# Patient Record
Sex: Female | Born: 1965 | Race: White | Hispanic: No | Marital: Single | Smoking: Current every day smoker
Health system: Southern US, Community
[De-identification: ages and names within clinical notes are randomized; demographics above are authoritative.]

## PROBLEM LIST (undated history)

## (undated) DIAGNOSIS — E049 Nontoxic goiter, unspecified: Secondary | ICD-10-CM

## (undated) DIAGNOSIS — M519 Unspecified thoracic, thoracolumbar and lumbosacral intervertebral disc disorder: Secondary | ICD-10-CM

## (undated) DIAGNOSIS — E039 Hypothyroidism, unspecified: Secondary | ICD-10-CM

## (undated) DIAGNOSIS — S0990XA Unspecified injury of head, initial encounter: Secondary | ICD-10-CM

## (undated) DIAGNOSIS — J42 Unspecified chronic bronchitis: Secondary | ICD-10-CM

## (undated) DIAGNOSIS — R569 Unspecified convulsions: Secondary | ICD-10-CM

## (undated) DIAGNOSIS — M51369 Other intervertebral disc degeneration, lumbar region without mention of lumbar back pain or lower extremity pain: Secondary | ICD-10-CM

## (undated) DIAGNOSIS — M545 Low back pain, unspecified: Secondary | ICD-10-CM

## (undated) DIAGNOSIS — F319 Bipolar disorder, unspecified: Secondary | ICD-10-CM

## (undated) DIAGNOSIS — E785 Hyperlipidemia, unspecified: Secondary | ICD-10-CM

## (undated) DIAGNOSIS — M5126 Other intervertebral disc displacement, lumbar region: Secondary | ICD-10-CM

## (undated) DIAGNOSIS — R011 Cardiac murmur, unspecified: Secondary | ICD-10-CM

## (undated) DIAGNOSIS — I10 Essential (primary) hypertension: Secondary | ICD-10-CM

## (undated) DIAGNOSIS — R413 Other amnesia: Secondary | ICD-10-CM

## (undated) DIAGNOSIS — G8929 Other chronic pain: Secondary | ICD-10-CM

## (undated) DIAGNOSIS — F32A Depression, unspecified: Secondary | ICD-10-CM

## (undated) DIAGNOSIS — F419 Anxiety disorder, unspecified: Secondary | ICD-10-CM

## (undated) DIAGNOSIS — S0450XA Injury of facial nerve, unspecified side, initial encounter: Secondary | ICD-10-CM

## (undated) DIAGNOSIS — K219 Gastro-esophageal reflux disease without esophagitis: Secondary | ICD-10-CM

## (undated) DIAGNOSIS — M5136 Other intervertebral disc degeneration, lumbar region: Secondary | ICD-10-CM

## (undated) DIAGNOSIS — R51 Headache: Secondary | ICD-10-CM

## (undated) DIAGNOSIS — F329 Major depressive disorder, single episode, unspecified: Secondary | ICD-10-CM

## (undated) DIAGNOSIS — G43909 Migraine, unspecified, not intractable, without status migrainosus: Secondary | ICD-10-CM

## (undated) HISTORY — PX: ABDOMINAL HYSTERECTOMY: SHX81

## (undated) HISTORY — DX: Headache: R51

## (undated) HISTORY — PX: TONSILLECTOMY: SUR1361

---

## 2004-01-12 ENCOUNTER — Ambulatory Visit: Payer: Self-pay | Admitting: Anesthesiology

## 2004-07-27 ENCOUNTER — Emergency Department: Payer: Self-pay | Admitting: Unknown Physician Specialty

## 2004-09-01 ENCOUNTER — Ambulatory Visit: Payer: Self-pay | Admitting: Unknown Physician Specialty

## 2004-12-07 ENCOUNTER — Emergency Department: Payer: Self-pay | Admitting: Emergency Medicine

## 2005-06-03 ENCOUNTER — Emergency Department: Payer: Self-pay | Admitting: Emergency Medicine

## 2005-11-05 ENCOUNTER — Emergency Department: Payer: Self-pay | Admitting: Internal Medicine

## 2006-01-26 ENCOUNTER — Emergency Department: Payer: Self-pay | Admitting: Emergency Medicine

## 2006-04-24 ENCOUNTER — Emergency Department: Payer: Self-pay | Admitting: Emergency Medicine

## 2006-12-21 ENCOUNTER — Other Ambulatory Visit: Payer: Self-pay

## 2006-12-21 ENCOUNTER — Emergency Department: Payer: Self-pay | Admitting: Emergency Medicine

## 2007-10-18 ENCOUNTER — Emergency Department: Payer: Self-pay | Admitting: Internal Medicine

## 2007-10-24 ENCOUNTER — Encounter
Admission: RE | Admit: 2007-10-24 | Discharge: 2007-10-24 | Payer: Self-pay | Admitting: Physical Medicine & Rehabilitation

## 2008-05-08 ENCOUNTER — Emergency Department: Payer: Self-pay | Admitting: Emergency Medicine

## 2008-09-13 ENCOUNTER — Emergency Department: Payer: Self-pay | Admitting: Emergency Medicine

## 2009-07-05 ENCOUNTER — Inpatient Hospital Stay: Payer: Self-pay | Admitting: Unknown Physician Specialty

## 2009-08-04 ENCOUNTER — Inpatient Hospital Stay: Payer: Self-pay | Admitting: Psychiatry

## 2010-05-06 ENCOUNTER — Inpatient Hospital Stay: Payer: Self-pay | Admitting: Psychiatry

## 2010-06-17 ENCOUNTER — Emergency Department: Payer: Self-pay | Admitting: Emergency Medicine

## 2010-08-31 ENCOUNTER — Inpatient Hospital Stay: Payer: Self-pay | Admitting: Psychiatry

## 2010-09-12 ENCOUNTER — Emergency Department: Payer: Self-pay | Admitting: Emergency Medicine

## 2011-02-15 ENCOUNTER — Emergency Department: Payer: Self-pay | Admitting: Emergency Medicine

## 2011-02-15 LAB — DRUG SCREEN, URINE
Barbiturates, Ur Screen: NEGATIVE (ref ?–200)
Cannabinoid 50 Ng, Ur ~~LOC~~: NEGATIVE (ref ?–50)
Cocaine Metabolite,Ur ~~LOC~~: NEGATIVE (ref ?–300)
Opiate, Ur Screen: NEGATIVE (ref ?–300)
Phencyclidine (PCP) Ur S: NEGATIVE (ref ?–25)
Tricyclic, Ur Screen: NEGATIVE (ref ?–1000)

## 2011-02-15 LAB — URINALYSIS, COMPLETE
Bilirubin,UR: NEGATIVE
Nitrite: NEGATIVE
Ph: 5 (ref 4.5–8.0)
Protein: NEGATIVE
Squamous Epithelial: 9
WBC UR: 10 /HPF (ref 0–5)

## 2011-02-15 LAB — CBC
HCT: 37.5 % (ref 35.0–47.0)
HGB: 12.9 g/dL (ref 12.0–16.0)
MCV: 101 fL — ABNORMAL HIGH (ref 80–100)
Platelet: 211 10*3/uL (ref 150–440)
RBC: 3.71 10*6/uL — ABNORMAL LOW (ref 3.80–5.20)
RDW: 13.5 % (ref 11.5–14.5)
WBC: 5.2 10*3/uL (ref 3.6–11.0)

## 2011-02-15 LAB — ETHANOL
Ethanol %: <0.003 % (ref 0.000–0.080)
Ethanol: <3 mg/dL

## 2011-02-15 LAB — COMPREHENSIVE METABOLIC PANEL
Alkaline Phosphatase: 41 U/L — ABNORMAL LOW (ref 50–136)
Anion Gap: 9 (ref 7–16)
BUN: 8 mg/dL (ref 7–18)
Bilirubin,Total: 0.4 mg/dL (ref 0.2–1.0)
Calcium, Total: 8.7 mg/dL (ref 8.5–10.1)
Chloride: 101 mmol/L (ref 98–107)
EGFR (African American): >60
Glucose: 81 mg/dL (ref 65–99)
Osmolality: 269 (ref 275–301)
Potassium: 3.5 mmol/L (ref 3.5–5.1)
SGPT (ALT): 19 U/L
Sodium: 136 mmol/L (ref 136–145)
Total Protein: 8.3 g/dL — ABNORMAL HIGH (ref 6.4–8.2)

## 2011-04-25 LAB — CBC
HCT: 36.7 % (ref 35.0–47.0)
MCH: 34.4 pg — ABNORMAL HIGH (ref 26.0–34.0)
MCHC: 34.2 g/dL (ref 32.0–36.0)
MCV: 101 fL — ABNORMAL HIGH (ref 80–100)
Platelet: 235 10*3/uL (ref 150–440)
RBC: 3.65 10*6/uL — ABNORMAL LOW (ref 3.80–5.20)
RDW: 12.9 % (ref 11.5–14.5)
WBC: 6.1 10*3/uL (ref 3.6–11.0)

## 2011-04-25 LAB — URINALYSIS, COMPLETE
Bacteria: NONE SEEN
Blood: NEGATIVE
Glucose,UR: NEGATIVE mg/dL (ref 0–75)
Ketone: NEGATIVE
Nitrite: NEGATIVE
Specific Gravity: 1.024 (ref 1.003–1.030)
Squamous Epithelial: <1

## 2011-04-25 LAB — TSH: Thyroid Stimulating Horm: 5.45 u[IU]/mL — ABNORMAL HIGH

## 2011-04-25 LAB — SALICYLATE LEVEL: Salicylates, Serum: <1.7 mg/dL

## 2011-04-25 LAB — COMPREHENSIVE METABOLIC PANEL
Alkaline Phosphatase: 59 U/L (ref 50–136)
Anion Gap: 7 (ref 7–16)
BUN: 10 mg/dL (ref 7–18)
Bilirubin,Total: 0.3 mg/dL (ref 0.2–1.0)
Calcium, Total: 9.1 mg/dL (ref 8.5–10.1)
Co2: 27 mmol/L (ref 21–32)
Creatinine: 0.64 mg/dL (ref 0.60–1.30)
EGFR (Non-African Amer.): >60
Osmolality: 268 (ref 275–301)
Potassium: 4.3 mmol/L (ref 3.5–5.1)
SGPT (ALT): 33 U/L
Sodium: 135 mmol/L — ABNORMAL LOW (ref 136–145)
Total Protein: 7.9 g/dL (ref 6.4–8.2)

## 2011-04-25 LAB — DRUG SCREEN, URINE
Amphetamines, Ur Screen: NEGATIVE (ref ?–1000)
Cannabinoid 50 Ng, Ur ~~LOC~~: NEGATIVE (ref ?–50)
MDMA (Ecstasy)Ur Screen: NEGATIVE (ref ?–500)
Opiate, Ur Screen: POSITIVE (ref ?–300)
Phencyclidine (PCP) Ur S: NEGATIVE (ref ?–25)

## 2011-04-25 LAB — ETHANOL
Ethanol %: <0.003 % (ref 0.000–0.080)
Ethanol: <3 mg/dL

## 2011-04-25 LAB — PREGNANCY, URINE: Pregnancy Test, Urine: NEGATIVE m[IU]/mL

## 2011-04-25 LAB — ACETAMINOPHEN LEVEL: Acetaminophen: 112 ug/mL — ABNORMAL HIGH

## 2011-04-26 ENCOUNTER — Inpatient Hospital Stay: Payer: Self-pay | Admitting: Specialist

## 2011-04-26 LAB — ACETAMINOPHEN LEVEL: Acetaminophen: 25 ug/mL

## 2011-04-27 LAB — HEPATIC FUNCTION PANEL A (ARMC)
Albumin: 3.5 g/dL (ref 3.4–5.0)
Alkaline Phosphatase: 53 U/L (ref 50–136)
Bilirubin, Direct: <0.1 mg/dL (ref 0.00–0.20)
SGOT(AST): 18 U/L (ref 15–37)
SGPT (ALT): 31 U/L
Total Protein: 7.5 g/dL (ref 6.4–8.2)

## 2011-04-27 LAB — PROTIME-INR: INR: 1

## 2011-04-29 ENCOUNTER — Inpatient Hospital Stay: Payer: Self-pay | Admitting: Psychiatry

## 2011-04-29 LAB — DRUG SCREEN, URINE
Amphetamines, Ur Screen: NEGATIVE (ref ?–1000)
Barbiturates, Ur Screen: NEGATIVE (ref ?–200)
Benzodiazepine, Ur Scrn: POSITIVE (ref ?–200)
MDMA (Ecstasy)Ur Screen: NEGATIVE (ref ?–500)
Phencyclidine (PCP) Ur S: NEGATIVE (ref ?–25)

## 2011-08-21 ENCOUNTER — Inpatient Hospital Stay: Payer: Self-pay | Admitting: Psychiatry

## 2011-08-21 LAB — COMPREHENSIVE METABOLIC PANEL
Alkaline Phosphatase: 69 U/L (ref 50–136)
Anion Gap: 12 (ref 7–16)
BUN: 7 mg/dL (ref 7–18)
Bilirubin,Total: 0.4 mg/dL (ref 0.2–1.0)
Calcium, Total: 8.5 mg/dL (ref 8.5–10.1)
Co2: 22 mmol/L (ref 21–32)
EGFR (Non-African Amer.): >60
Glucose: 99 mg/dL (ref 65–99)
Osmolality: 276 (ref 275–301)
Sodium: 139 mmol/L (ref 136–145)
Total Protein: 8.1 g/dL (ref 6.4–8.2)

## 2011-08-21 LAB — DRUG SCREEN, URINE
Amphetamines, Ur Screen: NEGATIVE (ref ?–1000)
Benzodiazepine, Ur Scrn: NEGATIVE (ref ?–200)
Cannabinoid 50 Ng, Ur ~~LOC~~: NEGATIVE (ref ?–50)
MDMA (Ecstasy)Ur Screen: NEGATIVE (ref ?–500)
Methadone, Ur Screen: NEGATIVE (ref ?–300)

## 2011-08-21 LAB — TROPONIN I: Troponin-I: <0.02 ng/mL

## 2011-08-21 LAB — TSH: Thyroid Stimulating Horm: 6.57 u[IU]/mL — ABNORMAL HIGH

## 2011-08-21 LAB — CBC
HCT: 38.7 % (ref 35.0–47.0)
HGB: 13.4 g/dL (ref 12.0–16.0)
MCHC: 34.7 g/dL (ref 32.0–36.0)
RBC: 4.1 10*6/uL (ref 3.80–5.20)
WBC: 7.7 10*3/uL (ref 3.6–11.0)

## 2011-08-21 LAB — SALICYLATE LEVEL: Salicylates, Serum: <1.7 mg/dL

## 2011-08-23 LAB — BASIC METABOLIC PANEL
Anion Gap: 11 (ref 7–16)
BUN: 11 mg/dL (ref 7–18)
Chloride: 106 mmol/L (ref 98–107)
Co2: 25 mmol/L (ref 21–32)
Glucose: 100 mg/dL — ABNORMAL HIGH (ref 65–99)
Potassium: 4.3 mmol/L (ref 3.5–5.1)
Sodium: 142 mmol/L (ref 136–145)

## 2011-09-16 ENCOUNTER — Emergency Department (HOSPITAL_COMMUNITY): Payer: Medicaid Other

## 2011-09-16 ENCOUNTER — Encounter (HOSPITAL_COMMUNITY): Payer: Self-pay | Admitting: *Deleted

## 2011-09-16 ENCOUNTER — Inpatient Hospital Stay (HOSPITAL_COMMUNITY)
Admission: EM | Admit: 2011-09-16 | Discharge: 2011-09-19 | DRG: 690 | Disposition: A | Discharge status: Home / Self Care | Payer: Medicaid Other | Attending: Family Medicine | Admitting: Family Medicine

## 2011-09-16 DIAGNOSIS — F3160 Bipolar disorder, current episode mixed, unspecified: Secondary | ICD-10-CM | POA: Diagnosis present

## 2011-09-16 DIAGNOSIS — N12 Tubulo-interstitial nephritis, not specified as acute or chronic: Secondary | ICD-10-CM | POA: Diagnosis present

## 2011-09-16 DIAGNOSIS — F419 Anxiety disorder, unspecified: Secondary | ICD-10-CM | POA: Diagnosis present

## 2011-09-16 DIAGNOSIS — A498 Other bacterial infections of unspecified site: Secondary | ICD-10-CM | POA: Diagnosis present

## 2011-09-16 DIAGNOSIS — M545 Low back pain, unspecified: Secondary | ICD-10-CM | POA: Diagnosis present

## 2011-09-16 DIAGNOSIS — N390 Urinary tract infection, site not specified: Secondary | ICD-10-CM | POA: Diagnosis present

## 2011-09-16 DIAGNOSIS — E039 Hypothyroidism, unspecified: Secondary | ICD-10-CM

## 2011-09-16 DIAGNOSIS — M549 Dorsalgia, unspecified: Secondary | ICD-10-CM

## 2011-09-16 DIAGNOSIS — F319 Bipolar disorder, unspecified: Secondary | ICD-10-CM | POA: Diagnosis present

## 2011-09-16 DIAGNOSIS — F411 Generalized anxiety disorder: Secondary | ICD-10-CM | POA: Diagnosis present

## 2011-09-16 HISTORY — DX: Essential (primary) hypertension: I10

## 2011-09-16 HISTORY — DX: Bipolar disorder, unspecified: F31.9

## 2011-09-16 HISTORY — DX: Anxiety disorder, unspecified: F41.9

## 2011-09-16 LAB — COMPREHENSIVE METABOLIC PANEL
BUN: 12 mg/dL (ref 6–23)
Calcium: 8.6 mg/dL (ref 8.4–10.5)
GFR calc Af Amer: 62 mL/min — ABNORMAL LOW (ref >90)
Glucose, Bld: 120 mg/dL — ABNORMAL HIGH (ref 70–99)
Sodium: 132 mEq/L — ABNORMAL LOW (ref 135–145)
Total Protein: 7.3 g/dL (ref 6.0–8.3)

## 2011-09-16 LAB — URINALYSIS, ROUTINE W REFLEX MICROSCOPIC
Bilirubin Urine: NEGATIVE
Ketones, ur: NEGATIVE mg/dL
Nitrite: POSITIVE — AB
Nitrite: POSITIVE — AB
Protein, ur: 100 mg/dL — AB
Urobilinogen, UA: 1 mg/dL (ref 0.0–1.0)
pH: 6 (ref 5.0–8.0)

## 2011-09-16 LAB — LACTIC ACID, PLASMA: Lactic Acid, Venous: 1.3 mmol/L (ref 0.5–2.2)

## 2011-09-16 LAB — CBC WITH DIFFERENTIAL/PLATELET
Basophils Relative: 0 % (ref 0–1)
Eosinophils Absolute: 0 10*3/uL (ref 0.0–0.7)
Eosinophils Relative: 0 % (ref 0–5)
Lymphs Abs: 1.6 10*3/uL (ref 0.7–4.0)
MCH: 31.7 pg (ref 26.0–34.0)
MCHC: 33.4 g/dL (ref 30.0–36.0)
MCV: 94.8 fL (ref 78.0–100.0)
Monocytes Relative: 12 % (ref 3–12)
Platelets: 191 10*3/uL (ref 150–400)
RBC: 3.63 MIL/uL — ABNORMAL LOW (ref 3.87–5.11)

## 2011-09-16 LAB — URINE MICROSCOPIC-ADD ON

## 2011-09-16 MED ORDER — SODIUM CHLORIDE 0.9 % IV SOLN
1000.0000 mL | INTRAVENOUS | Status: DC
  Administered 2011-09-16 – 2011-09-19 (×2): 1000 mL via INTRAVENOUS

## 2011-09-16 MED ORDER — MORPHINE SULFATE 4 MG/ML IJ SOLN: 4.0000 mg | Freq: Once | INTRAMUSCULAR | Status: DC

## 2011-09-16 MED ORDER — LIOTHYRONINE SODIUM 25 MCG PO TABS
25.0000 ug | ORAL_TABLET | Freq: Two times a day (BID) | ORAL | Status: DC
  Administered 2011-09-16 – 2011-09-19 (×6): 25 ug via ORAL
  Filled 2011-09-16 (×8): qty 1

## 2011-09-16 MED ORDER — BIOTENE DRY MOUTH MT LIQD
15.0000 mL | Freq: Two times a day (BID) | OROMUCOSAL | Status: DC
  Administered 2011-09-17 – 2011-09-18 (×3): 15 mL via OROMUCOSAL

## 2011-09-16 MED ORDER — GABAPENTIN 400 MG PO CAPS
800.0000 mg | ORAL_CAPSULE | Freq: Four times a day (QID) | ORAL | Status: DC
  Administered 2011-09-16 – 2011-09-19 (×11): 800 mg via ORAL
  Filled 2011-09-16 (×15): qty 2

## 2011-09-16 MED ORDER — ONDANSETRON HCL 4 MG/2ML IJ SOLN: 4.0000 mg | Freq: Four times a day (QID) | INTRAMUSCULAR | Status: DC | PRN

## 2011-09-16 MED ORDER — SODIUM CHLORIDE 0.9 % IV SOLN
1000.0000 mL | Freq: Once | INTRAVENOUS | Status: AC
  Administered 2011-09-16: 1000 mL via INTRAVENOUS

## 2011-09-16 MED ORDER — SODIUM CHLORIDE 0.9 % IV SOLN
INTRAVENOUS | Status: DC
  Administered 2011-09-16 – 2011-09-18 (×4): via INTRAVENOUS
  Administered 2011-09-18: 100 mL/h via INTRAVENOUS
  Administered 2011-09-18: 12:00:00 via INTRAVENOUS

## 2011-09-16 MED ORDER — CARISOPRODOL 250 MG PO TABS: 250.0000 mg | ORAL_TABLET | Freq: Four times a day (QID) | ORAL | Status: DC

## 2011-09-16 MED ORDER — ALPRAZOLAM 1 MG PO TABS
2.0000 mg | ORAL_TABLET | Freq: Three times a day (TID) | ORAL | Status: DC | PRN
  Administered 2011-09-17 – 2011-09-19 (×7): 2 mg via ORAL
  Filled 2011-09-16 (×7): qty 2

## 2011-09-16 MED ORDER — HEPARIN SODIUM (PORCINE) 5000 UNIT/ML IJ SOLN
5000.0000 [IU] | Freq: Three times a day (TID) | INTRAMUSCULAR | Status: DC
  Administered 2011-09-16 – 2011-09-19 (×8): 5000 [IU] via SUBCUTANEOUS
  Filled 2011-09-16 (×11): qty 1

## 2011-09-16 MED ORDER — ACETAMINOPHEN 325 MG PO TABS
ORAL_TABLET | ORAL | Status: AC
  Administered 2011-09-16: 325 mg
  Filled 2011-09-16: qty 1

## 2011-09-16 MED ORDER — ACETAMINOPHEN 325 MG PO TABS
650.0000 mg | ORAL_TABLET | Freq: Once | ORAL | Status: AC
  Administered 2011-09-16: 650 mg via ORAL
  Filled 2011-09-16: qty 2

## 2011-09-16 MED ORDER — ONDANSETRON HCL 4 MG PO TABS
4.0000 mg | ORAL_TABLET | Freq: Four times a day (QID) | ORAL | Status: DC | PRN
  Administered 2011-09-17: 4 mg via ORAL
  Filled 2011-09-16: qty 1

## 2011-09-16 MED ORDER — DULOXETINE HCL 60 MG PO CPEP
60.0000 mg | ORAL_CAPSULE | Freq: Two times a day (BID) | ORAL | Status: DC
  Administered 2011-09-16 – 2011-09-19 (×6): 60 mg via ORAL
  Filled 2011-09-16 (×8): qty 1

## 2011-09-16 MED ORDER — ALPRAZOLAM 1 MG PO TABS
2.0000 mg | ORAL_TABLET | Freq: Three times a day (TID) | ORAL | Status: DC | PRN
Start: 2011-09-16 — End: 2011-09-16

## 2011-09-16 MED ORDER — DOCUSATE SODIUM 100 MG PO CAPS
100.0000 mg | ORAL_CAPSULE | Freq: Every day | ORAL | Status: DC | PRN
  Filled 2011-09-16: qty 1

## 2011-09-16 MED ORDER — GADOBENATE DIMEGLUMINE 529 MG/ML IV SOLN
20.0000 mL | Freq: Once | INTRAVENOUS | Status: AC | PRN
  Administered 2011-09-16: 20 mL via INTRAVENOUS

## 2011-09-16 MED ORDER — CARISOPRODOL 350 MG PO TABS
350.0000 mg | ORAL_TABLET | Freq: Three times a day (TID) | ORAL | Status: DC
  Administered 2011-09-16 – 2011-09-19 (×10): 350 mg via ORAL
  Filled 2011-09-16 (×10): qty 1

## 2011-09-16 MED ORDER — DEXTROSE 5 % IV SOLN
1.0000 g | INTRAVENOUS | Status: DC
  Administered 2011-09-16: 12:00:00 via INTRAVENOUS
  Administered 2011-09-17 – 2011-09-18 (×2): 1 g via INTRAVENOUS
  Filled 2011-09-16 (×3): qty 10

## 2011-09-16 MED ORDER — DEXTROSE 5 % IV SOLN
1.0000 g | INTRAVENOUS | Status: DC
  Filled 2011-09-16: qty 10

## 2011-09-16 MED ORDER — LAMOTRIGINE 150 MG PO TABS
150.0000 mg | ORAL_TABLET | Freq: Two times a day (BID) | ORAL | Status: DC
  Administered 2011-09-16 – 2011-09-19 (×6): 150 mg via ORAL
  Filled 2011-09-16 (×8): qty 1

## 2011-09-16 MED ORDER — GABAPENTIN 800 MG PO TABS
800.0000 mg | ORAL_TABLET | Freq: Four times a day (QID) | ORAL | Status: DC
  Filled 2011-09-16 (×2): qty 1

## 2011-09-16 MED ORDER — HYDROCODONE-ACETAMINOPHEN 5-325 MG PO TABS
1.0000 | ORAL_TABLET | ORAL | Status: DC | PRN
  Administered 2011-09-16 – 2011-09-17 (×3): 1 via ORAL
  Administered 2011-09-17: 2 via ORAL
  Administered 2011-09-17: 1 via ORAL
  Administered 2011-09-17 – 2011-09-19 (×8): 2 via ORAL
  Filled 2011-09-16 (×2): qty 2
  Filled 2011-09-16: qty 1
  Filled 2011-09-16 (×3): qty 2
  Filled 2011-09-16: qty 1
  Filled 2011-09-16: qty 2
  Filled 2011-09-16 (×2): qty 1
  Filled 2011-09-16 (×3): qty 2

## 2011-09-16 NOTE — ED Notes (Signed)
Patient o2 sats are 90% and below on r/a.  Placed on supplemental 02

## 2011-09-16 NOTE — ED Notes (Signed)
Patient awake and eating lunch.

## 2011-09-16 NOTE — ED Notes (Signed)
States started feeling bad 3 days ago- hurts everywhere, legs ache, back hurts. Denies dysuria, denies cold sx.
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
## 2011-09-16 NOTE — Progress Notes (Signed)
Patient arrived to room. Asked if had any meds from home, denies, informed cannot take any we will medicate here with all her meds, states understanding.

## 2011-09-16 NOTE — Progress Notes (Signed)
ANTIBIOTIC CONSULT NOTE - INITIAL  Pharmacy Consult for Rocephin Indication: UTI  No Known Allergies  Patient Measurements: Height: 5\' 4"  (162.6 cm) Weight: 213 lb (96.616 kg) IBW/kg (Calculated) : 54.7   Vital Signs: Temp: 102.8 F (39.3 C) (08/03 1524) Temp src: Oral (08/03 1524) BP: 109/75 mmHg (08/03 1524) Pulse Rate: 107  (08/03 1524) Intake/Output from previous day:   Intake/Output from this shift: Total I/O In: 2000 [I.V.:2000] Out: 500 [Urine:500]  Labs:  [facility] 09/16/11 0745  WBC 15.6*  HGB 11.5*  PLT 191  LABCREA --  CREATININE 1.21*   Estimated Creatinine Clearance: 67 ml/min (by C-G formula based on Cr of 1.21). No results found for this basename: VANCOTROUGH:2,VANCOPEAK:2,VANCORANDOM:2,GENTTROUGH:2,GENTPEAK:2,GENTRANDOM:2,TOBRATROUGH:2,TOBRAPEAK:2,TOBRARND:2,AMIKACINPEAK:2,AMIKACINTROU:2,AMIKACIN:2, in the last 72 hours   Microbiology: No results found for this or any previous visit (from the past 720 hour(s)).  Medical History: Past Medical History  Diagnosis Date  . Hypertension   . Bipolar 1 disorder   . Anxiety     Medications:  Anti-infectives     Start     Dose/Rate Route Frequency Ordered Stop   09/17/11 1200   cefTRIAXone (ROCEPHIN) 1 g in dextrose 5 % 50 mL IVPB        1 g 100 mL/hr over 30 Minutes Intravenous Every 24 hours 09/16/11 1623     09/16/11 1130   cefTRIAXone (ROCEPHIN) 1 g in dextrose 5 % 50 mL IVPB        1 g 100 mL/hr over 30 Minutes Intravenous Every 24 hours 09/16/11 1129           Assessment:  Rocephin ordered by Rx for UTI  Medication needs no adjustment for renal function  Renal function WNL  Goal of Therapy:  Antibiotic for UTI  Plan:  Continue Rocephin 1gm q24, first dose given 12n today Follow up culture results  Otho Bellows PharmD Pager [phone] 09/16/2011,4:31 PM

## 2011-09-16 NOTE — ED Notes (Signed)
Ordered lunch 

## 2011-09-16 NOTE — ED Provider Notes (Signed)
History     CSN: [id number] Arrival date & time 09/16/11  0747 First MD Initiated Contact with Patient 09/16/11 [phone]      Chief Complaint  Patient presents with  . Extremity Weakness    Bilateral legs  . Weakness  . Back Pain   HPI The patient presents emergency room with complaints of lower back pain.  Pt states it started 3 days ago although she does have a history of chronic back pain.  She sees a pain management doctor for this.  Pt has had general malaise and has noticed some urinary frequency.  She did not notice that she had a fever.  She denies nausea, vomiting, rash, cough or chest pain.  She states her back is worse than usual.  It is radiating down her legs. No acute injuries.   Past Medical History  Diagnosis Date  . Hypertension   . Bipolar 1 disorder   . Anxiety     History reviewed. No pertinent past surgical history.  History reviewed. No pertinent family history.  History  Substance Use Topics  . Smoking status: Never Smoker   . Smokeless tobacco: Not on file  . Alcohol Use: No    OB History    Grav Para Term Preterm Abortions TAB SAB Ect Mult Living                  Review of Systems  HENT: Negative for neck pain and neck stiffness.   Respiratory: Negative for cough.   Gastrointestinal: Negative for abdominal pain.  Musculoskeletal: Negative for joint swelling.  Skin: Negative for rash.  All other systems reviewed and are negative.    Allergies  Review of patient's allergies indicates no known allergies.  Home Medications  No current outpatient prescriptions on file.  BP 107/65  Pulse 109  Temp 103 F (39.4 C) (Oral)  Resp 16  Ht 5\' 4"  (1.626 m)  Wt 213 lb (96.616 kg)  BMI 36.56 kg/m2  SpO2 94%  Physical Exam  Nursing note and vitals reviewed. Constitutional: She is oriented to person, place, and time. She appears well-developed and well-nourished. No distress.       Listless, febrile  HENT:  Head: Normocephalic and atraumatic.    Right Ear: External ear normal.  Left Ear: External ear normal.  Eyes: Conjunctivae are normal. Right eye exhibits no discharge. Left eye exhibits no discharge. No scleral icterus.  Neck: Neck supple. No tracheal deviation present.  Cardiovascular: Regular rhythm and intact distal pulses.  Tachycardia present.   Pulmonary/Chest: Effort normal and breath sounds normal. No stridor. No respiratory distress. She has no wheezes. She has no rales.  Abdominal: Soft. Bowel sounds are normal. She exhibits no distension. There is no tenderness. There is no rebound and no guarding.  Musculoskeletal: She exhibits no edema and no tenderness.       Lumbar back: She exhibits tenderness and pain. She exhibits no swelling, no edema and no deformity.  Lymphadenopathy:    She has no cervical adenopathy.  Neurological: She is alert and oriented to person, place, and time. She has normal strength. No sensory deficit. Cranial nerve deficit:  no gross defecits noted. She exhibits normal muscle tone. She displays no seizure activity. Coordination normal.       5/5 strength plantar and dorsiflexion, nl sensation lower extrem  Skin: Skin is warm and dry. No rash noted.  Psychiatric: She has a normal mood and affect.    ED Course  Procedures (including critical  care time)  EKG  Rate: 107  Rhythm: sinus tach  QRS Axis: normal  Intervals: normal  ST/T Wave abnormalities: normal  Conduction Disutrbances:none  Old EKG Reviewed: none available  Labs Reviewed  CBC WITH DIFFERENTIAL - Abnormal; Notable for the following:    WBC 15.6 (*)     RBC 3.63 (*)     Hemoglobin 11.5 (*)     HCT 34.4 (*)     Neutrophils Relative 78 (*)     Neutro Abs 12.3 (*)     Lymphocytes Relative 10 (*)     Monocytes Absolute 1.8 (*)     All other components within normal limits  COMPREHENSIVE METABOLIC PANEL - Abnormal; Notable for the following:    Sodium 132 (*)     Glucose, Bld 120 (*)     Creatinine, Ser 1.21 (*)     AST 48  (*)     ALT 49 (*)     GFR calc non Af Amer 54 (*)     GFR calc Af Amer 62 (*)     All other components within normal limits  URINALYSIS, ROUTINE W REFLEX MICROSCOPIC - Abnormal; Notable for the following:    Color, Urine AMBER (*)  BIOCHEMICALS MAY BE AFFECTED BY COLOR   APPearance TURBID (*)     Hgb urine dipstick LARGE (*)     Protein, ur 100 (*)     Nitrite POSITIVE (*)     Leukocytes, UA LARGE (*)     All other components within normal limits  URINE MICROSCOPIC-ADD ON - Abnormal; Notable for the following:    Squamous Epithelial / LPF MANY (*)     Bacteria, UA MANY (*)     All other components within normal limits  LACTIC ACID, PLASMA  CULTURE, BLOOD (ROUTINE X 2)  CULTURE, BLOOD (ROUTINE X 2)  PROCALCITONIN  URINE CULTURE  URINALYSIS, ROUTINE W REFLEX MICROSCOPIC   Dg Chest 2 View  09/16/2011  *RADIOLOGY REPORT*  Clinical Data: Weakness and back pain.  CHEST - 2 VIEW  Comparison: No priors.  Findings: Lung volumes are low.  There are bibasilar opacities favored to represent areas of subsegmental atelectasis.  No definite pleural effusions.  Mild pulmonary venous congestion without frank pulmonary edema.  Heart size is upper limits of normal. The patient is rotated to the left on today's exam, resulting in distortion of the mediastinal contours and reduced diagnostic sensitivity and specificity for mediastinal pathology.  IMPRESSION: 1.  Low lung volumes with probable bibasilar subsegmental atelectasis. 2.  Pulmonary venous congestion without frank pulmonary edema. Borderline cardiomegaly.  Original Report Authenticated By: Florencia Reasons, M.D.   Dg Lumbar Spine Complete  09/16/2011  *RADIOLOGY REPORT*  Clinical Data: Low back pain radiating to the lower extremities bilaterally.  LUMBAR SPINE - COMPLETE 4+ VIEW  Comparison: No priors.  Findings: No acute displaced fractures or definite compression type fractures.  Alignment is anatomic.  Mild multilevel degenerative disc disease,  most severe at L5-S1.  Mild multilevel facet arthropathy, most severe at L4-L5 and L5-S1.  No pars defects are appreciated.  IMPRESSION: 1.  No acute radiographic abnormality of the lumbar spine to account for the patient's symptoms. 2.  Mild multilevel degenerative disc disease and lumbar spondylosis, as above.  Original Report Authenticated By: Florencia Reasons, M.D.   DX: Pyelonephritis  Chronic back pain   MDM  Pt appears that she has taken her xanax, soma and neurontin this am.  Likely contributing to her  somnolence.  Doubt sepsis.  Possible uti although with the many squamous epithelial cells will repeat ua.  Question whether she could have a discitis.  Will mri spine for further eval considering her radicular complaints.  Pt appears to have pyelo.  Repeat cath ua still consistent with infection.  Pt still tachycardic with borderline BP.  Some of her pain is clearly related to her chronic back problems in addition to the pyelo.  MRI pending.  Will consult hospitalist.        Celene Kras, MD 09/16/11 1225

## 2011-09-16 NOTE — ED Notes (Signed)
Patient transported to MRI 

## 2011-09-16 NOTE — Progress Notes (Addendum)
Paged Dr. Cena Benton that temp remains 102.0 after vicodin and tyenol given at 1501. MD states he is aware and this is fine, also patient has been put on continuous pulse ox and o2

## 2011-09-17 ENCOUNTER — Encounter (HOSPITAL_COMMUNITY): Payer: Self-pay | Admitting: *Deleted

## 2011-09-17 LAB — CBC
HCT: 32.3 % — ABNORMAL LOW (ref 36.0–46.0)
Hemoglobin: 10.9 g/dL — ABNORMAL LOW (ref 12.0–15.0)
MCH: 32.2 pg (ref 26.0–34.0)
MCHC: 33.7 g/dL (ref 30.0–36.0)
MCV: 95.3 fL (ref 78.0–100.0)
RDW: 14 % (ref 11.5–15.5)

## 2011-09-17 LAB — BASIC METABOLIC PANEL
BUN: 7 mg/dL (ref 6–23)
Chloride: 104 mEq/L (ref 96–112)
Creatinine, Ser: 0.93 mg/dL (ref 0.50–1.10)
Glucose, Bld: 132 mg/dL — ABNORMAL HIGH (ref 70–99)
Potassium: 3.6 mEq/L (ref 3.5–5.1)

## 2011-09-17 MED ORDER — PHENAZOPYRIDINE HCL 100 MG PO TABS
100.0000 mg | ORAL_TABLET | Freq: Three times a day (TID) | ORAL | Status: DC
  Administered 2011-09-18 – 2011-09-19 (×4): 100 mg via ORAL
  Filled 2011-09-17 (×6): qty 1

## 2011-09-17 MED ORDER — NITROFURANTOIN MACROCRYSTAL 50 MG PO CAPS
50.0000 mg | ORAL_CAPSULE | Freq: Four times a day (QID) | ORAL | Status: DC
  Administered 2011-09-17 – 2011-09-18 (×5): 50 mg via ORAL
  Filled 2011-09-17 (×7): qty 1

## 2011-09-17 NOTE — Progress Notes (Signed)
Patient called for Xanax and Norco at 2015.  RN went to pts room immediately to observe patient already fallen asleep. RN spent 30 minutes in pt's room charting, waiting for patient to awaken.  Patient remained asleep the whole time.  RN utilized nursing judgement to replace meds into med cart for a later time. Will monitor patient closely for oversedation. Teyana Pierron [place], [place] 09/17/2011 8:38 PM

## 2011-09-17 NOTE — Progress Notes (Signed)
[facility] PROGRESS NOTE  Brittany [name] AVW:[id number] DOB: 09/17/1965 DOA: 09/16/2011 PCP: Evelene [name], MD  Assessment/Plan: Active Problems:  Pyelonephritis  UTI (lower urinary tract infection)  Bipolar affective, mixed  Hypothyroidism  Anxiety  1. Pyelonephritis:  - Will place on Rocephin and add nitrofurantoin - F/u with urine and blood cultures  - Follow WBC cound and fever curve  - Pain control  2. UTI  - Causing number 1 most likely  - Place on IV antibiotics and reassess then tailor antibiotic regimen once sensitivities in. - Add pyridium  3. Anxiety  - Stable will plan on continuing home regimen. Cymbalta and alprazolam   4. Bipolar  - Continue home regimen. Currently stable   5. DVT  - Place on Heparin   Code Status: Full code  Family Communication: No family at bedside  Disposition Plan: Pending clinical improvement   Brief narrative: Pt is a 46 y/o that presented with UTI/Pyelonephritis.  Currently awaiting urine cultures and following WBC counts as well as fever curve.  Consultants:  None  Procedures:  none  Antibiotics:  Rocephin,  Nitrofurantoin  HPI/Subjective: Pt reports that she is still having dysuria and abdominal discomfort.  No acute issues reported overnight.  Pt denies any nausea, emesis, chest pain, hematuria, BRBPR.  Objective: Filed Vitals:   09/17/11 0132 09/17/11 0302 09/17/11 0452 09/17/11 1348  BP:   106/71 117/71  Pulse:   100 104  Temp: 102.2 F (39 C) 100.2 F (37.9 C) 98 F (36.7 C) 99.6 F (37.6 C)  TempSrc: Oral Oral Oral Oral  Resp:   18 18  Height:      Weight:      SpO2:   93% 97%    Intake/Output Summary (Last 24 hours) at 09/17/11 1826 Last data filed at 09/17/11 1349  Gross per 24 hour  Intake 1526.67 ml  Output      0 ml  Net 1526.67 ml    Exam:   General:  Pt in NAD, A and O x 3  Cardiovascular: RRR, No MRG  Respiratory: CTA BL, no wheezes  Abdomen: Suprapubic  tenderness, nondistended, + Bowel sounds  Data Reviewed: Basic Metabolic Panel:  Lab 09/17/11 8295 09/16/11 0745  NA 135 132*  K 3.6 3.6  CL 104 97  CO2 19 25  GLUCOSE 132* 120*  BUN 7 12  CREATININE 0.93 1.21*  CALCIUM 8.3* 8.6  MG -- --  PHOS -- --   Liver Function Tests:  Lab 09/16/11 0745  AST 48*  ALT 49*  ALKPHOS 115  BILITOT 0.8  PROT 7.3  ALBUMIN 3.6   No results found for this basename: LIPASE:5,AMYLASE:5 in the last 168 hours No results found for this basename: AMMONIA:5 in the last 168 hours CBC:  Lab 09/17/11 0440 09/16/11 0745  WBC 15.7* 15.6*  NEUTROABS -- 12.3*  HGB 10.9* 11.5*  HCT 32.3* 34.4*  MCV 95.3 94.8  PLT 104* 191   Cardiac Enzymes: No results found for this basename: CKTOTAL:5,CKMB:5,CKMBINDEX:5,TROPONINI:5 in the last 168 hours BNP (last 3 results) No results found for this basename: PROBNP:3 in the last 8760 hours CBG: No results found for this basename: GLUCAP:5 in the last 168 hours  Recent Results (from the past 240 hour(s))  CULTURE, BLOOD (ROUTINE X 2)     Status: Normal (Preliminary result)   Collection Time   09/16/11  8:45 AM      Component Value Range Status Comment   Specimen Description BLOOD LEFT AC  Final    Special Requests BOTTLES DRAWN AEROBIC AND ANAEROBIC 5 CC EACH   Final    Culture  Setup Time 09/16/2011 14:22   Final    Culture     Final    Value:        BLOOD CULTURE RECEIVED NO GROWTH TO DATE CULTURE WILL BE HELD FOR 5 DAYS BEFORE ISSUING A FINAL NEGATIVE REPORT   Report Status PENDING   Incomplete   URINE CULTURE     Status: Normal (Preliminary result)   Collection Time   09/16/11  8:50 AM      Component Value Range Status Comment   Specimen Description URINE, CATHETERIZED   Final    Special Requests NONE   Final    Culture  Setup Time 09/16/2011 14:22   Final    Colony Count >=100,000 COLONIES/ML   Final    Culture ESCHERICHIA COLI   Final    Report Status PENDING   Incomplete   CULTURE, BLOOD (ROUTINE  X 2)     Status: Normal (Preliminary result)   Collection Time   09/16/11  9:19 AM      Component Value Range Status Comment   Specimen Description BLOOD LEFT HAND   Final    Special Requests BOTTLES DRAWN AEROBIC AND ANAEROBIC 5CC EACH   Final    Culture  Setup Time 09/16/2011 14:22   Final    Culture     Final    Value:        BLOOD CULTURE RECEIVED NO GROWTH TO DATE CULTURE WILL BE HELD FOR 5 DAYS BEFORE ISSUING A FINAL NEGATIVE REPORT   Report Status PENDING   Incomplete      Studies: Dg Chest 2 View  09/16/2011  *RADIOLOGY REPORT*  Clinical Data: Weakness and back pain.  CHEST - 2 VIEW  Comparison: No priors.  Findings: Lung volumes are low.  There are bibasilar opacities favored to represent areas of subsegmental atelectasis.  No definite pleural effusions.  Mild pulmonary venous congestion without frank pulmonary edema.  Heart size is upper limits of normal. The patient is rotated to the left on today's exam, resulting in distortion of the mediastinal contours and reduced diagnostic sensitivity and specificity for mediastinal pathology.  IMPRESSION: 1.  Low lung volumes with probable bibasilar subsegmental atelectasis. 2.  Pulmonary venous congestion without frank pulmonary edema. Borderline cardiomegaly.  Original Report Authenticated By: Florencia Reasons, M.D.   Dg Lumbar Spine Complete  09/16/2011  *RADIOLOGY REPORT*  Clinical Data: Low back pain radiating to the lower extremities bilaterally.  LUMBAR SPINE - COMPLETE 4+ VIEW  Comparison: No priors.  Findings: No acute displaced fractures or definite compression type fractures.  Alignment is anatomic.  Mild multilevel degenerative disc disease, most severe at L5-S1.  Mild multilevel facet arthropathy, most severe at L4-L5 and L5-S1.  No pars defects are appreciated.  IMPRESSION: 1.  No acute radiographic abnormality of the lumbar spine to account for the patient's symptoms. 2.  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  Original Report Authenticated By: Florencia Reasons, M.D.   Mr Lumbar Spine W Wo Contrast  09/16/2011  *RADIOLOGY REPORT*  Clinical Data: Low back pain and fever.  MRI LUMBAR SPINE WITHOUT AND WITH CONTRAST  Technique:  Multiplanar and multiecho pulse sequences of the lumbar spine were obtained without and with intravenous contrast.  Contrast: 20mL MULTIHANCE GADOBENATE DIMEGLUMINE 529 MG/ML IV SOLN  Comparison: Lumbar spine radiographs 09/16/2011.  Findings: Normal  signal is present in the conus medullaris which terminates at L1.  Marrow signal, vertebral body heights, alignment are normal.  No focal disc signal abnormality or pathologic enhancement is evident.  Limited imaging of the abdomen is unremarkable.  The disc levels at L3-4 and above are normal.  L4-5:  Minimal disc bulging is present.  There is no significant stenosis.  L5-S1:  A leftward disc herniation is present.  Asymmetric left- sided fatty end plate marrow changes are present.  Asymmetric left- sided facet disease is noted.  There is mild left foraminal narrowing.  IMPRESSION:  1.  Leftward disc herniation and endplate changes at L5-S1 with mild left foraminal stenosis. 2.  Mild disc bulging at L4-5 without significant stenosis. 3.  No acute or focal abnormality to explain the patient's fevers. No evidence for infection.  Original Report Authenticated By: Jamesetta Orleans. MATTERN, M.D.    Scheduled Meds:   . antiseptic oral rinse  15 mL Mouth Rinse BID  . carisoprodol  350 mg Oral TID  . cefTRIAXone (ROCEPHIN)  IV  1 g Intravenous Q24H  . DULoxetine  60 mg Oral BID  . gabapentin  800 mg Oral QID  . heparin  5,000 Units Subcutaneous Q8H  . lamoTRIgine  150 mg Oral BID  . liothyronine  25 mcg Oral BID  .  morphine injection  4 mg Intravenous Once  . nitrofurantoin  50 mg Oral Q6H  . phenazopyridine  100 mg Oral TID WC  . DISCONTD: cefTRIAXone (ROCEPHIN)  IV  1 g Intravenous Q24H   Continuous Infusions:   .  sodium chloride Stopped (09/16/11 2020)  . sodium chloride 100 mL/hr at 09/17/11 1554    Active Problems:  Pyelonephritis  UTI (lower urinary tract infection)  Bipolar affective, mixed  Hypothyroidism  Anxiety    Time spent: > 35 minutes    Penny Pia  [facility] Pager [phone]. If 8PM-8AM, please contact night-coverage at [email or web address], password [facility] 09/17/2011, 6:26 PM  LOS: 1 day

## 2011-09-17 NOTE — Progress Notes (Signed)
Nutrition Brief Note  Patient identified on the Nutrition Risk Report for Unintentional weight loss (>10 lb in 1 month).   Body mass index is 36.56 kg/(m^2). Pt meets criteria for Class II Obesity based on current BMI.   Current diet order is Regular, patient is consuming approximately 95% of meals at this time. Labs and medications reviewed.   No nutrition interventions warranted at this time. If nutrition issues arise, please re-consult RD.   Leonette Most RD, LDN

## 2011-09-18 LAB — URINE CULTURE

## 2011-09-18 LAB — CBC
MCH: 31.3 pg (ref 26.0–34.0)
MCHC: 32.2 g/dL (ref 30.0–36.0)
MCV: 97.2 fL (ref 78.0–100.0)
Platelets: 146 10*3/uL — ABNORMAL LOW (ref 150–400)
RDW: 14 % (ref 11.5–15.5)

## 2011-09-18 MED ORDER — LEVOFLOXACIN 750 MG PO TABS
750.0000 mg | ORAL_TABLET | ORAL | Status: DC
  Administered 2011-09-18 – 2011-09-19 (×2): 750 mg via ORAL
  Filled 2011-09-18 (×2): qty 1

## 2011-09-18 MED ORDER — DIPHENHYDRAMINE HCL 25 MG PO CAPS
25.0000 mg | ORAL_CAPSULE | Freq: Once | ORAL | Status: AC
  Administered 2011-09-18: 25 mg via ORAL
  Filled 2011-09-18: qty 1

## 2011-09-18 NOTE — Progress Notes (Signed)
Pt has been lethargic throughout the day. When awake will ask for pain medicine or antianxiety medication, but when med is taken in pt's room 5-10 mins later pt is found asleep. Pt explains that she would like for the nurse to wake her up to give her pain medicine. Because of lethargy RN inquires about home meds and asks the patient if she has any meds with her. She states "You can look through my purse if you want to." Only an empty bottle of phenergan is found, patient states, "It was empty before I got here." Two bottles of lipozene, one unopened, and one single pill of phentermine. With patient's permission, meds sent to pharmacy. Pt is now requesting for MD to order a drug screen and states that she's had them before.

## 2011-09-18 NOTE — Progress Notes (Signed)
[facility] PROGRESS NOTE  Brittany [name] ZOX:[id number] DOB: 01-Apr-1965 DOA: 09/16/2011 PCP: Evelene [name], MD  Assessment/Plan: Active Problems:  Pyelonephritis  UTI (lower urinary tract infection)  Bipolar affective, mixed  Hypothyroidism  Anxiety  1. Pyelonephritis:  - Will place on Rocephin and add nitrofurantoin  - Blood cultures negative, Urine cultures grew out E. Coli that grew out > 100,000 CFU pan sensitive. Placed on levaquin - Follow WBC count and fever curve. WBC is trending down. - Pain control   2. UTI  - Causing number 1 most likely  - Urine cultures grew out E. Coli that grew out > 100,000 CFU pan sensitive. Placed on levaquin - Added pyridium and pain improved on this regimen.  3. Anxiety  - Stable will plan on continuing home regimen. Cymbalta and alprazolam   4. Bipolar  - Continue home regimen. Currently stable   5. DVT  - Place on Heparin   Code Status: Full code  Family Communication: No family at bedside  Disposition Plan: Likely D/C tomorrow with a 10 day course of antibiotic (levaquin)  Brief narrative:  Pt is a 46 y/o that presented with UTI/Pyelonephritis. Currently awaiting urine cultures and following WBC counts as well as fever curve.  Consultants:  None Procedures:  none Antibiotics:  Rocephin,  Nitrofurantoin  HPI/Subjective: Patient feels better today. No acute issues reported overnight. Pt denies any nausea, emesis, chest pain, hematuria, BRBPR.   Objective: Filed Vitals:   09/17/11 0452 09/17/11 1348 09/17/11 2240 09/18/11 1514  BP: 106/71 117/71 120/82 120/88  Pulse: 100 104 102 100  Temp: 98 F (36.7 C) 99.6 F (37.6 C) 97.7 F (36.5 C) 97.3 F (36.3 C)  TempSrc: Oral Oral Oral Oral  Resp: 18 18 22 22   Height:      Weight:      SpO2: 93% 97% 96% 96%    Intake/Output Summary (Last 24 hours) at 09/18/11 1918 Last data filed at 09/18/11 1821  Gross per 24 hour  Intake   3070 ml  Output      0 ml    Net   3070 ml    Exam:  General: Pt in NAD, A and O x 3  Cardiovascular: RRR, No MRG  Respiratory: CTA BL, no wheezes  Abdomen: Suprapubic tenderness, nondistended, + Bowel sounds  Data Reviewed: Basic Metabolic Panel:  Lab 09/17/11 8119 09/16/11 0745  NA 135 132*  K 3.6 3.6  CL 104 97  CO2 19 25  GLUCOSE 132* 120*  BUN 7 12  CREATININE 0.93 1.21*  CALCIUM 8.3* 8.6  MG -- --  PHOS -- --   Liver Function Tests:  Lab 09/16/11 0745  AST 48*  ALT 49*  ALKPHOS 115  BILITOT 0.8  PROT 7.3  ALBUMIN 3.6   No results found for this basename: LIPASE:5,AMYLASE:5 in the last 168 hours No results found for this basename: AMMONIA:5 in the last 168 hours CBC:  Lab 09/18/11 0930 09/17/11 0440 09/16/11 0745  WBC 8.0 15.7* 15.6*  NEUTROABS -- -- 12.3*  HGB 9.9* 10.9* 11.5*  HCT 30.7* 32.3* 34.4*  MCV 97.2 95.3 94.8  PLT 146* 104* 191   Cardiac Enzymes: No results found for this basename: CKTOTAL:5,CKMB:5,CKMBINDEX:5,TROPONINI:5 in the last 168 hours BNP (last 3 results) No results found for this basename: PROBNP:3 in the last 8760 hours CBG: No results found for this basename: GLUCAP:5 in the last 168 hours  Recent Results (from the past 240 hour(s))  CULTURE, BLOOD (ROUTINE X 2)  Status: Normal (Preliminary result)   Collection Time   09/16/11  8:45 AM      Component Value Range Status Comment   Specimen Description BLOOD LEFT AC   Final    Special Requests BOTTLES DRAWN AEROBIC AND ANAEROBIC 5 CC EACH   Final    Culture  Setup Time 09/16/2011 14:22   Final    Culture     Final    Value:        BLOOD CULTURE RECEIVED NO GROWTH TO DATE CULTURE WILL BE HELD FOR 5 DAYS BEFORE ISSUING A FINAL NEGATIVE REPORT   Report Status PENDING   Incomplete   URINE CULTURE     Status: Normal   Collection Time   09/16/11  8:50 AM      Component Value Range Status Comment   Specimen Description URINE, CATHETERIZED   Final    Special Requests NONE   Final    Culture  Setup Time  09/16/2011 14:22   Final    Colony Count >=100,000 COLONIES/ML   Final    Culture ESCHERICHIA COLI   Final    Report Status 09/18/2011 FINAL   Final    Organism ID, Bacteria ESCHERICHIA COLI   Final   CULTURE, BLOOD (ROUTINE X 2)     Status: Normal (Preliminary result)   Collection Time   09/16/11  9:19 AM      Component Value Range Status Comment   Specimen Description BLOOD LEFT HAND   Final    Special Requests BOTTLES DRAWN AEROBIC AND ANAEROBIC 5CC EACH   Final    Culture  Setup Time 09/16/2011 14:22   Final    Culture     Final    Value:        BLOOD CULTURE RECEIVED NO GROWTH TO DATE CULTURE WILL BE HELD FOR 5 DAYS BEFORE ISSUING A FINAL NEGATIVE REPORT   Report Status PENDING   Incomplete      Studies: Dg Chest 2 View  09/16/2011  *RADIOLOGY REPORT*  Clinical Data: Weakness and back pain.  CHEST - 2 VIEW  Comparison: No priors.  Findings: Lung volumes are low.  There are bibasilar opacities favored to represent areas of subsegmental atelectasis.  No definite pleural effusions.  Mild pulmonary venous congestion without frank pulmonary edema.  Heart size is upper limits of normal. The patient is rotated to the left on today's exam, resulting in distortion of the mediastinal contours and reduced diagnostic sensitivity and specificity for mediastinal pathology.  IMPRESSION: 1.  Low lung volumes with probable bibasilar subsegmental atelectasis. 2.  Pulmonary venous congestion without frank pulmonary edema. Borderline cardiomegaly.  Original Report Authenticated By: Florencia Reasons, M.D.   Dg Lumbar Spine Complete  09/16/2011  *RADIOLOGY REPORT*  Clinical Data: Low back pain radiating to the lower extremities bilaterally.  LUMBAR SPINE - COMPLETE 4+ VIEW  Comparison: No priors.  Findings: No acute displaced fractures or definite compression type fractures.  Alignment is anatomic.  Mild multilevel degenerative disc disease, most severe at L5-S1.  Mild multilevel facet arthropathy, most severe at  L4-L5 and L5-S1.  No pars defects are appreciated.  IMPRESSION: 1.  No acute radiographic abnormality of the lumbar spine to account for the patient's symptoms. 2.  Mild multilevel degenerative disc disease and lumbar spondylosis, as above.  Original Report Authenticated By: Florencia Reasons, M.D.   Mr Lumbar Spine W Wo Contrast  09/16/2011  *RADIOLOGY REPORT*  Clinical Data: Low back pain and fever.  MRI LUMBAR  SPINE WITHOUT AND WITH CONTRAST  Technique:  Multiplanar and multiecho pulse sequences of the lumbar spine were obtained without and with intravenous contrast.  Contrast: 20mL MULTIHANCE GADOBENATE DIMEGLUMINE 529 MG/ML IV SOLN  Comparison: Lumbar spine radiographs 09/16/2011.  Findings: Normal signal is present in the conus medullaris which terminates at L1.  Marrow signal, vertebral body heights, alignment are normal.  No focal disc signal abnormality or pathologic enhancement is evident.  Limited imaging of the abdomen is unremarkable.  The disc levels at L3-4 and above are normal.  L4-5:  Minimal disc bulging is present.  There is no significant stenosis.  L5-S1:  A leftward disc herniation is present.  Asymmetric left- sided fatty end plate marrow changes are present.  Asymmetric left- sided facet disease is noted.  There is mild left foraminal narrowing.  IMPRESSION:  1.  Leftward disc herniation and endplate changes at L5-S1 with mild left foraminal stenosis. 2.  Mild disc bulging at L4-5 without significant stenosis. 3.  No acute or focal abnormality to explain the patient's fevers. No evidence for infection.  Original Report Authenticated By: Jamesetta Orleans. MATTERN, M.D.    Scheduled Meds:   . antiseptic oral rinse  15 mL Mouth Rinse BID  . carisoprodol  350 mg Oral TID  . diphenhydrAMINE  25 mg Oral Once  . DULoxetine  60 mg Oral BID  . gabapentin  800 mg Oral QID  . heparin  5,000 Units Subcutaneous Q8H  . lamoTRIgine  150 mg Oral BID  . levofloxacin  750 mg Oral Q24H  .  liothyronine  25 mcg Oral BID  .  morphine injection  4 mg Intravenous Once  . nitrofurantoin  50 mg Oral Q6H  . phenazopyridine  100 mg Oral TID WC  . DISCONTD: cefTRIAXone (ROCEPHIN)  IV  1 g Intravenous Q24H   Continuous Infusions:   . sodium chloride Stopped (09/16/11 2020)  . sodium chloride 100 mL/hr at 09/18/11 1154    Active Problems:  Pyelonephritis  UTI (lower urinary tract infection)  Bipolar affective, mixed  Hypothyroidism  Anxiety    Time spent: > 30 minutes    Penny Pia  [facility] Pager [phone]. If 8PM-8AM, please contact night-coverage at [email or web address], password [facility] 09/18/2011, 7:18 PM  LOS: 2 days

## 2011-09-19 LAB — CBC
MCH: 31.4 pg (ref 26.0–34.0)
MCHC: 32.8 g/dL (ref 30.0–36.0)
MCV: 95.8 fL (ref 78.0–100.0)
Platelets: 187 10*3/uL (ref 150–400)
RBC: 3.09 MIL/uL — ABNORMAL LOW (ref 3.87–5.11)
RDW: 13.9 % (ref 11.5–15.5)

## 2011-09-19 MED ORDER — LEVOFLOXACIN 500 MG PO TABS: 500.0000 mg | ORAL_TABLET | Freq: Every day | ORAL | Status: AC

## 2011-09-19 MED ORDER — HYDROCODONE-ACETAMINOPHEN 5-325 MG PO TABS: 1.0000 | ORAL_TABLET | ORAL | Status: AC | PRN
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
## 2011-09-19 NOTE — Progress Notes (Signed)
Discharge instructions, medications, activities, and symptoms to report discussed with patient.  Patient verbalized understanding and stated she had no questions.  Patient also received a copy of discharge summary to take back to group home.  Michel Harrow Halina Andreas, RN 09/19/2011 [phone]

## 2011-09-20 NOTE — Progress Notes (Signed)
Pt was d/c back to prior living arrangement 8/6. CM was contacted by nsg to assist with follow up recommendations and transportation home.  Cori Razor LCSW [phone]

## 2011-09-20 NOTE — Care Management Note (Signed)
    Page 1 of 1   09/20/2011     8:10:26 AM   CARE MANAGEMENT NOTE 09/20/2011  Patient:  Brittany [name],Brittany [name]   Account Number:  [id number]  Date Initiated:  09/18/2011  Documentation initiated by:  Lorenda Ishihara  Subjective/Objective Assessment:   46 yo female admitted with pyelonephritis, UTI. PTA lived at group home.     Action/Plan:   monitor temps and cultures   Anticipated DC Date:  09/20/2011   Anticipated DC Plan:  GROUP HOME      DC Planning Services  CM consult      Choice offered to / List presented to:             Status of service:  Completed, signed off Medicare Important Message given?   (If response is "NO", the following Medicare IM given date fields will be blank) Date Medicare IM given:   Date Additional Medicare IM given:    Discharge Disposition:  HOME/SELF CARE  Per UR Regulation:  Reviewed for med. necessity/level of care/duration of stay  If discussed at Long Length of Stay Meetings, dates discussed:    Comments:

## 2011-09-22 LAB — CULTURE, BLOOD (ROUTINE X 2): Culture: NO GROWTH

## 2011-11-12 ENCOUNTER — Encounter (HOSPITAL_COMMUNITY): Payer: Self-pay

## 2011-11-12 ENCOUNTER — Emergency Department (HOSPITAL_COMMUNITY)
Admission: EM | Admit: 2011-11-12 | Discharge: 2011-11-12 | Disposition: A | Discharge status: Home / Self Care | Payer: Medicaid Other | Attending: Emergency Medicine | Admitting: Emergency Medicine

## 2011-11-12 ENCOUNTER — Emergency Department (HOSPITAL_COMMUNITY): Payer: Medicaid Other

## 2011-11-12 DIAGNOSIS — F319 Bipolar disorder, unspecified: Secondary | ICD-10-CM | POA: Insufficient documentation

## 2011-11-12 DIAGNOSIS — G8929 Other chronic pain: Secondary | ICD-10-CM | POA: Insufficient documentation

## 2011-11-12 DIAGNOSIS — Z90710 Acquired absence of both cervix and uterus: Secondary | ICD-10-CM | POA: Insufficient documentation

## 2011-11-12 DIAGNOSIS — I10 Essential (primary) hypertension: Secondary | ICD-10-CM | POA: Insufficient documentation

## 2011-11-12 DIAGNOSIS — M549 Dorsalgia, unspecified: Secondary | ICD-10-CM

## 2011-11-12 DIAGNOSIS — F411 Generalized anxiety disorder: Secondary | ICD-10-CM | POA: Insufficient documentation

## 2011-11-12 DIAGNOSIS — M545 Low back pain, unspecified: Secondary | ICD-10-CM | POA: Insufficient documentation

## 2011-11-12 HISTORY — DX: Unspecified thoracic, thoracolumbar and lumbosacral intervertebral disc disorder: M51.9

## 2011-11-12 MED ORDER — IBUPROFEN 800 MG PO TABS
800.0000 mg | ORAL_TABLET | Freq: Once | ORAL | Status: AC
Start: 2011-11-12 — End: 2011-11-12
  Administered 2011-11-12: 800 mg via ORAL
  Filled 2011-11-12: qty 1

## 2011-11-12 MED ORDER — IBUPROFEN 800 MG PO TABS: 800.0000 mg | ORAL_TABLET | Freq: Three times a day (TID) | ORAL | Status: DC

## 2011-11-12 MED ORDER — CYCLOBENZAPRINE HCL 10 MG PO TABS: 10.0000 mg | ORAL_TABLET | Freq: Two times a day (BID) | ORAL | Status: DC | PRN
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
## 2011-11-12 NOTE — ED Notes (Signed)
Patient given discharge instructions, information, prescriptions, and diet order. Patient states that they adequately understand discharge information given and to return to ED if symptoms return or worsen.     

## 2011-11-24 NOTE — ED Provider Notes (Signed)
Medical screening examination/treatment/procedure(s) were performed by non-physician practitioner and as supervising physician I was immediately available for consultation/collaboration.  John-Adam Will Heinkel, M.D.     John-Adam Ruthvik Barnaby, MD 11/24/11 1816 

## 2012-01-04 ENCOUNTER — Encounter (HOSPITAL_COMMUNITY): Payer: Self-pay | Admitting: *Deleted

## 2012-01-04 ENCOUNTER — Emergency Department (HOSPITAL_COMMUNITY)
Admission: EM | Admit: 2012-01-04 | Discharge: 2012-01-04 | Disposition: A | Discharge status: Home / Self Care | Payer: Medicaid Other | Attending: Emergency Medicine | Admitting: Emergency Medicine

## 2012-01-04 DIAGNOSIS — IMO0002 Reserved for concepts with insufficient information to code with codable children: Secondary | ICD-10-CM | POA: Insufficient documentation

## 2012-01-04 DIAGNOSIS — T50901A Poisoning by unspecified drugs, medicaments and biological substances, accidental (unintentional), initial encounter: Secondary | ICD-10-CM

## 2012-01-04 DIAGNOSIS — T50991A Poisoning by other drugs, medicaments and biological substances, accidental (unintentional), initial encounter: Secondary | ICD-10-CM | POA: Insufficient documentation

## 2012-01-04 DIAGNOSIS — F3289 Other specified depressive episodes: Secondary | ICD-10-CM | POA: Insufficient documentation

## 2012-01-04 DIAGNOSIS — Z139 Encounter for screening, unspecified: Secondary | ICD-10-CM

## 2012-01-04 DIAGNOSIS — Y929 Unspecified place or not applicable: Secondary | ICD-10-CM | POA: Insufficient documentation

## 2012-01-04 DIAGNOSIS — F319 Bipolar disorder, unspecified: Secondary | ICD-10-CM | POA: Insufficient documentation

## 2012-01-04 DIAGNOSIS — I10 Essential (primary) hypertension: Secondary | ICD-10-CM | POA: Insufficient documentation

## 2012-01-04 DIAGNOSIS — F39 Unspecified mood [affective] disorder: Secondary | ICD-10-CM | POA: Insufficient documentation

## 2012-01-04 DIAGNOSIS — Y939 Activity, unspecified: Secondary | ICD-10-CM | POA: Insufficient documentation

## 2012-01-04 DIAGNOSIS — G43909 Migraine, unspecified, not intractable, without status migrainosus: Secondary | ICD-10-CM | POA: Insufficient documentation

## 2012-01-04 DIAGNOSIS — F32A Depression, unspecified: Secondary | ICD-10-CM

## 2012-01-04 DIAGNOSIS — G8929 Other chronic pain: Secondary | ICD-10-CM

## 2012-01-04 DIAGNOSIS — F411 Generalized anxiety disorder: Secondary | ICD-10-CM | POA: Insufficient documentation

## 2012-01-04 DIAGNOSIS — M549 Dorsalgia, unspecified: Secondary | ICD-10-CM | POA: Insufficient documentation

## 2012-01-04 DIAGNOSIS — Z79899 Other long term (current) drug therapy: Secondary | ICD-10-CM | POA: Insufficient documentation

## 2012-01-04 DIAGNOSIS — T398X1A Poisoning by other nonopioid analgesics and antipyretics, not elsewhere classified, accidental (unintentional), initial encounter: Secondary | ICD-10-CM | POA: Insufficient documentation

## 2012-01-04 DIAGNOSIS — F329 Major depressive disorder, single episode, unspecified: Secondary | ICD-10-CM | POA: Insufficient documentation

## 2012-01-04 HISTORY — DX: Migraine, unspecified, not intractable, without status migrainosus: G43.909

## 2012-01-04 LAB — CBC WITH DIFFERENTIAL/PLATELET
Basophils Absolute: 0 10*3/uL (ref 0.0–0.1)
Basophils Relative: 0 % (ref 0–1)
HCT: 38.3 % (ref 36.0–46.0)
Lymphocytes Relative: 23 % (ref 12–46)
MCHC: 33.9 g/dL (ref 30.0–36.0)
Monocytes Absolute: 0.4 10*3/uL (ref 0.1–1.0)
Neutro Abs: 3.9 10*3/uL (ref 1.7–7.7)
Platelets: 308 10*3/uL (ref 150–400)
RDW: 13.2 % (ref 11.5–15.5)
WBC: 5.7 10*3/uL (ref 4.0–10.5)

## 2012-01-04 LAB — RAPID URINE DRUG SCREEN, HOSP PERFORMED
Benzodiazepines: POSITIVE — AB
Opiates: NOT DETECTED

## 2012-01-04 LAB — ETHANOL: Alcohol, Ethyl (B): <11 mg/dL (ref 0–11)

## 2012-01-04 LAB — BASIC METABOLIC PANEL
CO2: 22 mEq/L (ref 19–32)
Calcium: 9.2 mg/dL (ref 8.4–10.5)
Chloride: 101 mEq/L (ref 96–112)
Creatinine, Ser: 0.63 mg/dL (ref 0.50–1.10)
GFR calc Af Amer: >90 mL/min (ref >90)
Sodium: 136 mEq/L (ref 135–145)

## 2012-01-04 LAB — SALICYLATE LEVEL: Salicylate Lvl: <2 mg/dL — ABNORMAL LOW (ref 2.8–20.0)

## 2012-01-04 LAB — ACETAMINOPHEN LEVEL: Acetaminophen (Tylenol), Serum: <15 ug/mL (ref 10–30)

## 2012-01-04 NOTE — ED Provider Notes (Addendum)
History     CSN: [id number]  Arrival date & time 01/04/12  1049   First MD Initiated Contact with Patient 01/04/12 1152      Chief Complaint  Patient presents with  . Medical Clearance    (Consider location/radiation/quality/duration/timing/severity/associated sxs/prior treatment) HPI Comments: Pt with h/o depression, has a case manager who told her to come to the ED.  Pt has a psychiatrist that treats her for depression.  Records indicate pt has Bipolar.  Pt doesn't think she needs to be here.  She admits being sad about her father passing a few weeks ago.  She denies intentional overdose, doesn't want to kill self.  Pt apparently took meds this AM and was altered and confused and friend called case manager perhaps.  Pt is not entirely clear the sequence of events.  Pt reports has chronic back pain, is asking for more pain meds.  She is on xanax TID, soma TID, and Tramadol QID and I suspect she took all 3 this AM, although she is prescribed to do so .   The history is provided by the patient.    Past Medical History  Diagnosis Date  . Hypertension   . Bipolar 1 disorder   . Anxiety   . Back pain   . Bulging disc   . Ruptured intervertebral disc   . Migraine     Past Surgical History  Procedure Date  . Abdominal hysterectomy     No family history on file.  History  Substance Use Topics  . Smoking status: Never Smoker   . Smokeless tobacco: Not on file  . Alcohol Use: No    OB History    Grav Para Term Preterm Abortions TAB SAB Ect Mult Living                  Review of Systems  Constitutional: Negative for fever and chills.  HENT: Negative for congestion and rhinorrhea.   Gastrointestinal: Negative for abdominal pain.  Musculoskeletal: Positive for back pain.  Neurological: Negative for weakness and numbness.  Psychiatric/Behavioral: Positive for dysphoric mood. Negative for suicidal ideas.  All other systems reviewed and are negative.    Allergies    Toradol  Home Medications   Current Outpatient Rx  Name  Route  Sig  Dispense  Refill  . ALPRAZOLAM 1 MG PO TABS   Oral   Take 1 mg by mouth 3 (three) times daily.         . ASENAPINE MALEATE 5 MG SL SUBL   Sublingual   Place 10 mg under the tongue at bedtime.         . BUSPIRONE HCL 30 MG PO TABS   Oral   Take 30 mg by mouth 2 (two) times daily with breakfast and lunch.         Marland Kitchen BUTALBITAL-APAP-CAFFEINE 50-325-40 MG PO TABS   Oral   Take 1 tablet by mouth 4 (four) times daily as needed.         Marland Kitchen CARISOPRODOL 250 MG PO TABS   Oral   Take 250 mg by mouth 3 (three) times daily.          . DULOXETINE HCL 60 MG PO CPEP   Oral   Take 60 mg by mouth daily.          Marland Kitchen GABAPENTIN 800 MG PO TABS   Oral   Take 800 mg by mouth 5 (five) times daily.          Marland Kitchen  LAMOTRIGINE 150 MG PO TABS   Oral   Take 150 mg by mouth 2 (two) times daily.         Marland Kitchen LEVOTHYROXINE SODIUM 50 MCG PO TABS   Oral   Take 50 mcg by mouth daily before breakfast.         . PHENTERMINE HCL 15 MG PO CAPS   Oral   Take 15 mg by mouth daily.         Marland Kitchen PROPRANOLOL HCL PO   Oral   Take 20 mg by mouth 2 (two) times daily at 10 AM and 5 PM.          . TIAGABINE HCL 4 MG PO TABS   Oral   Take 4-8 mg by mouth at bedtime.         . TRAMADOL HCL 50 MG PO TABS   Oral   Take 50 mg by mouth 4 (four) times daily.           BP 135/97  Pulse 78  Temp 97.9 F (36.6 C) (Oral)  Resp 18  SpO2 97%  Physical Exam  Nursing note and vitals reviewed. Constitutional: She appears well-developed and well-nourished.  Non-toxic appearance. She does not have a sickly appearance. She does not appear ill.  HENT:  Head: Normocephalic and atraumatic.  Eyes: Pupils are equal, round, and reactive to light. No scleral icterus.  Neck: Normal range of motion. Neck supple.  Cardiovascular: Normal rate.   Pulmonary/Chest: Effort normal.  Abdominal: Soft.  Musculoskeletal:       Thoracic back:  She exhibits pain. She exhibits no bony tenderness, no swelling, no deformity, no spasm and normal pulse.       Lumbar back: She exhibits tenderness and pain. She exhibits no bony tenderness, no swelling and no deformity.  Neurological: She is alert.  Skin: Skin is warm.  Psychiatric: Her mood appears not anxious. Her affect is not angry and not labile. Her speech is delayed. She is slowed. She exhibits a depressed mood. She expresses no suicidal ideation. She expresses no suicidal plans. She exhibits abnormal recent memory. She exhibits normal remote memory.    ED Course  Procedures (including critical care time)  Labs Reviewed  BASIC METABOLIC PANEL - Abnormal; Notable for the following:    Glucose, Bld 100 (*)     All other components within normal limits  URINE RAPID DRUG SCREEN (HOSP PERFORMED) - Abnormal; Notable for the following:    Benzodiazepines POSITIVE (*)     Barbiturates POSITIVE (*)     All other components within normal limits  SALICYLATE LEVEL - Abnormal; Notable for the following:    Salicylate Lvl <2.0 (*)     All other components within normal limits  CBC WITH DIFFERENTIAL  ETHANOL  ACETAMINOPHEN LEVEL   No results found.   1. Screening     ra sat is 97% and i interpret to be normal  3:16 PM Medically cleared, drug screen is neg except Benzo's which she is prescribed.  Will get telepsych consultation.  She remains arousable, communicative, oriented.    3:23 PM Pt's friend is here and relates that pt has multiple personalities, has acute psych decompensation and may be doing drugs.  There is a significant dichotomy between pt's history and friend's history.  Telepsych along with ACT assessment can help elucidate safety at home.    MDM  Pt is somewhat somnolent, but conversant, oriented but some amnesia to events of the AM.  Pt  is actviely denying SI, but there is still some possibility that she took intentional or unintentional overdose.  I think mentation  will continue to clear here.  Would get telepsych later to assess SI risk and need for inpt.          Gavin Pound. Oletta Lamas, MD 01/04/12 1517  Gavin Pound. Saanvi Hakala, MD 01/04/12 1525

## 2012-01-04 NOTE — ED Notes (Signed)
Pt has 2 belonging bags locked in locker 42

## 2012-01-04 NOTE — ED Notes (Signed)
Patient discharge to home via ambulatory with a steady gait. Respirations equal and unlabored. Skin warm and dry. No acute distress noted.

## 2012-01-04 NOTE — ED Provider Notes (Signed)
Brittany [name] is a 46 y.o. female being evaluated for inappropriate medication usage. She has been seen by Telepsych. She found her stable, not suicidal not delusional, or depressed. They recommended out patient treatment, when necessary with psychiatry, and therapy.  Patient's vital signs are stable, discharge. Family members are here with her.       Flint Melter, MD 01/04/12 2031

## 2012-01-04 NOTE — ED Notes (Signed)
Pt has made 2 phone calls. Advised that she has one remaining.

## 2012-01-04 NOTE — ED Notes (Signed)
Pt provided meal

## 2012-01-04 NOTE — ED Notes (Signed)
Med count performed with Silvio Pate, RN witnessing. Only medication that is off count based on date filled in Soma 350mg  TID filled yesterday (11/20), filled with #90. #80 present in bottle. All daily med containers empty. Case manager and friend that came in with pt were talking to pt about having her committed, staying in [facility] for a few days, then finding somewhere else for her to live, "like assisted living." Pt repeatedly stating she is not going to stay at a group home. Friend sts repeatedly to this RN that her case manager wants pt committed and asks RN to "pass this on the doctor." Her contact number is [phone], [place].
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
## 2012-01-04 NOTE — ED Notes (Signed)
Pt has used all of her phone calls. Security called to wand pt and search belongings.

## 2012-01-04 NOTE — ED Notes (Signed)
Dr. Effie Shy contacted inference to patient being discharge. Dr. Reported that "I would be able to due anything about it for the next 20 minutes" due to the fact he was taking care of another patient.

## 2012-01-04 NOTE — ED Notes (Signed)
Pt requesting visitor. Visitor walked back to bed 5 with security

## 2012-01-04 NOTE — ED Notes (Addendum)
Pt reports "I'm fine, my case manager made me come." Pt reports she only took her "morning meds." Unable to name all of them. Friend that is with her sts that after she took her meds, she became altered and lethargic. Pt A&Ox4 currently. Slightly slurring words. Friend believes pt took all of today's meds at one time. Pt denies this. Pt reports recent stressors at home. Denies OD, SI/HI. Pt sts she took her am meds about 10am today.

## 2012-01-05 ENCOUNTER — Emergency Department (HOSPITAL_COMMUNITY)
Admission: EM | Admit: 2012-01-05 | Discharge: 2012-01-05 | Disposition: A | Discharge status: Home / Self Care | Payer: Medicaid Other | Attending: Emergency Medicine | Admitting: Emergency Medicine

## 2012-01-05 ENCOUNTER — Encounter (HOSPITAL_COMMUNITY): Payer: Self-pay | Admitting: *Deleted

## 2012-01-05 DIAGNOSIS — T50905A Adverse effect of unspecified drugs, medicaments and biological substances, initial encounter: Secondary | ICD-10-CM

## 2012-01-05 DIAGNOSIS — F319 Bipolar disorder, unspecified: Secondary | ICD-10-CM | POA: Insufficient documentation

## 2012-01-05 DIAGNOSIS — IMO0002 Reserved for concepts with insufficient information to code with codable children: Secondary | ICD-10-CM | POA: Insufficient documentation

## 2012-01-05 DIAGNOSIS — G43909 Migraine, unspecified, not intractable, without status migrainosus: Secondary | ICD-10-CM | POA: Insufficient documentation

## 2012-01-05 DIAGNOSIS — F411 Generalized anxiety disorder: Secondary | ICD-10-CM | POA: Insufficient documentation

## 2012-01-05 DIAGNOSIS — I10 Essential (primary) hypertension: Secondary | ICD-10-CM | POA: Insufficient documentation

## 2012-01-05 DIAGNOSIS — T50995A Adverse effect of other drugs, medicaments and biological substances, initial encounter: Secondary | ICD-10-CM | POA: Insufficient documentation

## 2012-01-05 DIAGNOSIS — F3160 Bipolar disorder, current episode mixed, unspecified: Secondary | ICD-10-CM

## 2012-01-05 DIAGNOSIS — R4182 Altered mental status, unspecified: Secondary | ICD-10-CM | POA: Insufficient documentation

## 2012-01-05 DIAGNOSIS — Z79899 Other long term (current) drug therapy: Secondary | ICD-10-CM | POA: Insufficient documentation

## 2012-01-05 LAB — CBC WITH DIFFERENTIAL/PLATELET
Basophils Relative: 0 % (ref 0–1)
Hemoglobin: 12.1 g/dL (ref 12.0–15.0)
Lymphs Abs: 1.6 10*3/uL (ref 0.7–4.0)
MCHC: 33.4 g/dL (ref 30.0–36.0)
Monocytes Relative: 8 % (ref 3–12)
Neutro Abs: 2.6 10*3/uL (ref 1.7–7.7)
Neutrophils Relative %: 55 % (ref 43–77)
RBC: 3.76 MIL/uL — ABNORMAL LOW (ref 3.87–5.11)

## 2012-01-05 LAB — COMPREHENSIVE METABOLIC PANEL
ALT: 14 U/L (ref 0–35)
AST: 13 U/L (ref 0–37)
Alkaline Phosphatase: 53 U/L (ref 39–117)
GFR calc Af Amer: >90 mL/min (ref >90)
Glucose, Bld: 115 mg/dL — ABNORMAL HIGH (ref 70–99)
Potassium: 3.9 mEq/L (ref 3.5–5.1)
Sodium: 136 mEq/L (ref 135–145)
Total Protein: 7.2 g/dL (ref 6.0–8.3)

## 2012-01-05 LAB — RAPID URINE DRUG SCREEN, HOSP PERFORMED
Amphetamines: NOT DETECTED
Barbiturates: POSITIVE — AB
Benzodiazepines: POSITIVE — AB
Cocaine: NOT DETECTED
Tetrahydrocannabinol: NOT DETECTED

## 2012-01-05 LAB — ETHANOL: Alcohol, Ethyl (B): <11 mg/dL (ref 0–11)

## 2012-01-05 NOTE — ED Notes (Signed)
NWG:[id number]> Expected date:01/05/12<BR> Expected time:12:56 PM<BR> Means of arrival:Ambulance<BR> Comments:<BR> OD

## 2012-01-05 NOTE — ED Notes (Signed)
Pts father passed a few months ago, pt was seen here yesterday for same symptoms, lives at a group home, ems arrived and pt was very lethargic, a/ox3, sts she takes xanax and benzo, hx of substance abuse

## 2012-01-05 NOTE — ED Notes (Signed)
Iv team paged for IV start 

## 2012-01-05 NOTE — ED Provider Notes (Addendum)
History     CSN: [id number]  Arrival date & time 01/05/12  1259   First MD Initiated Contact with Patient 01/05/12 1305      Chief Complaint  Patient presents with  . Drug Overdose    (Consider location/radiation/quality/duration/timing/severity/associated sxs/prior treatment) HPI Comments: [name] presents for evaluation of altered mental status.  Her roommate found her somnolent and slumped over at her group home.  Her mental status is reported to have improved prior to the ambulance arriving.  She denies suicidal or homicidal ideation.  She denies intentionally overdosing or taking medications that were not prescribed to her.  She denies any trauma and states she feels that she is managing well even with her recent stressors (father's death and boyfriend's accident).    Patient is a 46 y.o. female presenting with altered mental status. The history is provided by the patient. No language interpreter was used.  Altered Mental Status This is a new problem. The current episode started less than 1 hour ago. The problem has been resolved. Pertinent negatives include no chest pain, no abdominal pain, no headaches and no shortness of breath. Nothing aggravates the symptoms. Nothing relieves the symptoms.    Past Medical History  Diagnosis Date  . Hypertension   . Bipolar 1 disorder   . Anxiety   . Back pain   . Bulging disc   . Ruptured intervertebral disc   . Migraine     Past Surgical History  Procedure Date  . Abdominal hysterectomy     No family history on file.  History  Substance Use Topics  . Smoking status: Never Smoker   . Smokeless tobacco: Not on file  . Alcohol Use: No    OB History    Grav Para Term Preterm Abortions TAB SAB Ect Mult Living                  Review of Systems  Constitutional: Negative for fever, diaphoresis, activity change and fatigue.  HENT: Negative.   Eyes: Negative.   Respiratory: Negative for shortness of breath.     Cardiovascular: Negative for chest pain.  Gastrointestinal: Negative for nausea, vomiting, abdominal pain, diarrhea and constipation.  Genitourinary: Negative.   Musculoskeletal: Negative.   Skin: Negative.   Neurological: Negative for tremors, seizures, speech difficulty, weakness and headaches.  Psychiatric/Behavioral: Positive for dysphoric mood (chronic) and altered mental status. Negative for suicidal ideas, hallucinations, confusion, sleep disturbance, self-injury, decreased concentration and agitation. The patient is not nervous/anxious.     Allergies  Toradol  Home Medications   Current Outpatient Rx  Name  Route  Sig  Dispense  Refill  . ALPRAZOLAM 1 MG PO TABS   Oral   Take 1 mg by mouth 3 (three) times daily.         . ASENAPINE MALEATE 5 MG SL SUBL   Sublingual   Place 10 mg under the tongue at bedtime.         . BUSPIRONE HCL 30 MG PO TABS   Oral   Take 30 mg by mouth 2 (two) times daily with breakfast and lunch.         Marland Kitchen BUTALBITAL-APAP-CAFFEINE 50-325-40 MG PO TABS   Oral   Take 1 tablet by mouth 4 (four) times daily as needed.         Marland Kitchen CARISOPRODOL 250 MG PO TABS   Oral   Take 250 mg by mouth 3 (three) times daily.          Marland Kitchen  DULOXETINE HCL 60 MG PO CPEP   Oral   Take 60 mg by mouth daily.          Marland Kitchen GABAPENTIN 800 MG PO TABS   Oral   Take 800 mg by mouth 5 (five) times daily.          Marland Kitchen LAMOTRIGINE 150 MG PO TABS   Oral   Take 150 mg by mouth 2 (two) times daily.         Marland Kitchen LEVOTHYROXINE SODIUM 50 MCG PO TABS   Oral   Take 50 mcg by mouth daily before breakfast.         . PHENTERMINE HCL 15 MG PO CAPS   Oral   Take 15 mg by mouth daily.         Marland Kitchen PROPRANOLOL HCL PO   Oral   Take 20 mg by mouth 2 (two) times daily at 10 AM and 5 PM.          . TIAGABINE HCL 4 MG PO TABS   Oral   Take 4-8 mg by mouth at bedtime.         . TRAMADOL HCL 50 MG PO TABS   Oral   Take 50 mg by mouth 4 (four) times daily.            BP 100/63  Pulse 80  Temp 97.4 F (36.3 C) (Oral)  Resp 16  SpO2 98%  Physical Exam  Nursing note and vitals reviewed. Constitutional: She is oriented to person, place, and time. She appears well-developed and well-nourished. No distress.  HENT:  Head: Normocephalic and atraumatic.  Right Ear: External ear normal.  Left Ear: External ear normal.  Mouth/Throat: Oropharynx is clear and moist. No oropharyngeal exudate.  Eyes: Conjunctivae normal and EOM are normal. Pupils are equal, round, and reactive to light. Right eye exhibits no discharge. Left eye exhibits no discharge. No scleral icterus.       No nystagmus   Neck: Normal range of motion. No JVD present. No tracheal deviation present.  Cardiovascular: Normal rate, regular rhythm, normal heart sounds and intact distal pulses.  Exam reveals no gallop and no friction rub.   No murmur heard. Pulmonary/Chest: Effort normal and breath sounds normal. No stridor. No respiratory distress. She has no wheezes. She has no rales. She exhibits no tenderness.  Abdominal: Soft. Bowel sounds are normal. She exhibits no distension and no mass. There is no tenderness. There is no rebound and no guarding.  Musculoskeletal: Normal range of motion. She exhibits no edema and no tenderness.  Lymphadenopathy:    She has no cervical adenopathy.  Neurological: She is alert and oriented to person, place, and time. She has normal strength. She displays no atrophy and no tremor. No cranial nerve deficit or sensory deficit. She exhibits normal muscle tone. She displays no seizure activity. Coordination (nl gait) normal. GCS eye subscore is 4. GCS verbal subscore is 5. GCS motor subscore is 6. She displays no Babinski's sign on the right side. She displays no Babinski's sign on the left side.  Skin: Skin is warm and dry. No rash noted. She is not diaphoretic. No erythema. No pallor.  Psychiatric: Judgment and thought content normal. Her mood appears not  anxious. Her affect is not angry, not blunt, not labile and not inappropriate. Her speech is not rapid and/or pressured, not delayed, not tangential and not slurred. She is slowed. She is not agitated, not aggressive, is not hyperactive, not withdrawn, not  actively hallucinating and not combative. Thought content is not paranoid and not delusional. Cognition and memory are normal. Cognition and memory are not impaired. She does not express impulsivity. She does not exhibit a depressed mood ( flat affect - denies depression). She expresses no homicidal and no suicidal ideation. She expresses no suicidal plans and no homicidal plans. She is communicative. She exhibits normal recent memory and normal remote memory. She is attentive.    ED Course  Procedures (including critical care time)  Labs Reviewed  CBC WITH DIFFERENTIAL - Abnormal; Notable for the following:    RBC 3.76 (*)     All other components within normal limits  COMPREHENSIVE METABOLIC PANEL - Abnormal; Notable for the following:    Glucose, Bld 115 (*)     All other components within normal limits  SALICYLATE LEVEL - Abnormal; Notable for the following:    Salicylate Lvl <2.0 (*)     All other components within normal limits  URINE RAPID DRUG SCREEN (HOSP PERFORMED) - Abnormal; Notable for the following:    Benzodiazepines POSITIVE (*)     Barbiturates POSITIVE (*)     All other components within normal limits  ETHANOL  ACETAMINOPHEN LEVEL   No results found.   No diagnosis found.   Date: 01/05/2012  Rate: 80 bpm  Rhythm: sinus  QRS Axis: normal  Intervals: normal  ST/T Wave abnormalities: normal  Conduction Disutrbances:none  Narrative Interpretation:   Old EKG Reviewed: unchanged      MDM  Pt presents after being found altered in her home.  She denies abusing any of her prescriptions but states she feels she may not have taken them correctly.  She vehemently denies suicidal intentions and states she has already  called and will follow-up with behavioral health on Monday (in 3 days).  She is awake and oriented without neurologic deficits.  She does not have an elevated blood alcohol level and her urine tox screen is only positive for medications she is known to have been prescribed.  She was seen here yesterday for a similar issue.  Her pills have been placed in a pill dispenser and labeled at which times they should be taken.  Plan discharge home.        Tobin Chad, MD 01/05/12 1610  Tobin Chad, MD 01/05/12 [phone]

## 2012-01-05 NOTE — ED Notes (Signed)
MD at bedside. Powers

## 2012-01-13 ENCOUNTER — Inpatient Hospital Stay (HOSPITAL_COMMUNITY)
Admission: RE | Admit: 2012-01-13 | Discharge: 2012-01-18 | DRG: 885 | Disposition: A | Discharge status: Home / Self Care | Payer: Medicaid Other | Source: Ambulatory Visit | Attending: Psychiatry | Admitting: Psychiatry

## 2012-01-13 ENCOUNTER — Encounter (HOSPITAL_COMMUNITY): Payer: Self-pay | Admitting: Emergency Medicine

## 2012-01-13 ENCOUNTER — Emergency Department (HOSPITAL_COMMUNITY)
Admission: EM | Admit: 2012-01-13 | Discharge: 2012-01-13 | Disposition: A | Discharge status: Other Acute Inpatient Hospital | Payer: Medicaid Other | Attending: Emergency Medicine | Admitting: Emergency Medicine

## 2012-01-13 DIAGNOSIS — F319 Bipolar disorder, unspecified: Principal | ICD-10-CM | POA: Diagnosis present

## 2012-01-13 DIAGNOSIS — Z79899 Other long term (current) drug therapy: Secondary | ICD-10-CM | POA: Insufficient documentation

## 2012-01-13 DIAGNOSIS — G43909 Migraine, unspecified, not intractable, without status migrainosus: Secondary | ICD-10-CM | POA: Diagnosis present

## 2012-01-13 DIAGNOSIS — M549 Dorsalgia, unspecified: Secondary | ICD-10-CM | POA: Diagnosis present

## 2012-01-13 DIAGNOSIS — F411 Generalized anxiety disorder: Secondary | ICD-10-CM | POA: Insufficient documentation

## 2012-01-13 DIAGNOSIS — I10 Essential (primary) hypertension: Secondary | ICD-10-CM | POA: Diagnosis present

## 2012-01-13 DIAGNOSIS — F309 Manic episode, unspecified: Secondary | ICD-10-CM

## 2012-01-13 DIAGNOSIS — F3160 Bipolar disorder, current episode mixed, unspecified: Secondary | ICD-10-CM

## 2012-01-13 DIAGNOSIS — F419 Anxiety disorder, unspecified: Secondary | ICD-10-CM | POA: Diagnosis present

## 2012-01-13 DIAGNOSIS — E039 Hypothyroidism, unspecified: Secondary | ICD-10-CM

## 2012-01-13 DIAGNOSIS — IMO0002 Reserved for concepts with insufficient information to code with codable children: Secondary | ICD-10-CM | POA: Insufficient documentation

## 2012-01-13 DIAGNOSIS — F3110 Bipolar disorder, current episode manic without psychotic features, unspecified: Secondary | ICD-10-CM | POA: Insufficient documentation

## 2012-01-13 DIAGNOSIS — F603 Borderline personality disorder: Secondary | ICD-10-CM | POA: Diagnosis present

## 2012-01-13 LAB — TROPONIN I: Troponin I: <0.3 ng/mL (ref <0.30)

## 2012-01-13 LAB — CBC WITH DIFFERENTIAL/PLATELET
Basophils Absolute: 0 10*3/uL (ref 0.0–0.1)
Basophils Relative: 0 % (ref 0–1)
Eosinophils Absolute: 0.1 10*3/uL (ref 0.0–0.7)
Hemoglobin: 14.3 g/dL (ref 12.0–15.0)
MCH: 32.8 pg (ref 26.0–34.0)
MCHC: 34.9 g/dL (ref 30.0–36.0)
Neutro Abs: 8.4 10*3/uL — ABNORMAL HIGH (ref 1.7–7.7)
Neutrophils Relative %: 75 % (ref 43–77)
Platelets: 280 10*3/uL (ref 150–400)
RDW: 12.9 % (ref 11.5–15.5)

## 2012-01-13 LAB — ETHANOL: Alcohol, Ethyl (B): <11 mg/dL (ref 0–11)

## 2012-01-13 LAB — COMPREHENSIVE METABOLIC PANEL
AST: 13 U/L (ref 0–37)
Albumin: 4.6 g/dL (ref 3.5–5.2)
Alkaline Phosphatase: 71 U/L (ref 39–117)
Chloride: 101 mEq/L (ref 96–112)
Potassium: 3.6 mEq/L (ref 3.5–5.1)
Total Bilirubin: 0.4 mg/dL (ref 0.3–1.2)
Total Protein: 8.4 g/dL — ABNORMAL HIGH (ref 6.0–8.3)

## 2012-01-13 LAB — URINE MICROSCOPIC-ADD ON

## 2012-01-13 LAB — URINALYSIS, ROUTINE W REFLEX MICROSCOPIC
Glucose, UA: NEGATIVE mg/dL
Ketones, ur: 15 mg/dL — AB
Nitrite: NEGATIVE
Specific Gravity, Urine: 1.016 (ref 1.005–1.030)
pH: 7.5 (ref 5.0–8.0)

## 2012-01-13 LAB — ACETAMINOPHEN LEVEL: Acetaminophen (Tylenol), Serum: <15 ug/mL (ref 10–30)

## 2012-01-13 LAB — RAPID URINE DRUG SCREEN, HOSP PERFORMED
Amphetamines: NOT DETECTED
Benzodiazepines: NOT DETECTED
Cocaine: NOT DETECTED

## 2012-01-13 MED ORDER — BUSPIRONE HCL 10 MG PO TABS
30.0000 mg | ORAL_TABLET | Freq: Two times a day (BID) | ORAL | Status: DC
  Administered 2012-01-14 – 2012-01-18 (×10): 30 mg via ORAL
  Filled 2012-01-13 (×11): qty 2

## 2012-01-13 MED ORDER — MAGNESIUM HYDROXIDE 400 MG/5ML PO SUSP: 30.0000 mL | Freq: Every day | ORAL | Status: DC | PRN

## 2012-01-13 MED ORDER — IBUPROFEN 200 MG PO TABS: 600.0000 mg | ORAL_TABLET | Freq: Three times a day (TID) | ORAL | Status: DC | PRN

## 2012-01-13 MED ORDER — ASENAPINE MALEATE 5 MG SL SUBL
10.0000 mg | SUBLINGUAL_TABLET | Freq: Every day | SUBLINGUAL | Status: DC
  Filled 2012-01-13: qty 2

## 2012-01-13 MED ORDER — CIPROFLOXACIN HCL 500 MG PO TABS
500.0000 mg | ORAL_TABLET | Freq: Two times a day (BID) | ORAL | Status: DC
  Administered 2012-01-13 – 2012-01-18 (×10): 500 mg via ORAL
  Filled 2012-01-13 (×13): qty 1

## 2012-01-13 MED ORDER — LORAZEPAM 1 MG PO TABS
1.0000 mg | ORAL_TABLET | Freq: Three times a day (TID) | ORAL | Status: DC
  Administered 2012-01-13 (×2): 1 mg via ORAL
  Filled 2012-01-13 (×2): qty 1

## 2012-01-13 MED ORDER — LORAZEPAM 1 MG PO TABS
ORAL_TABLET | ORAL | Status: AC
  Administered 2012-01-13: 1 mg via ORAL
  Filled 2012-01-13: qty 1

## 2012-01-13 MED ORDER — ONDANSETRON HCL 8 MG PO TABS: 4.0000 mg | ORAL_TABLET | Freq: Three times a day (TID) | ORAL | Status: DC | PRN

## 2012-01-13 MED ORDER — DULOXETINE HCL 60 MG PO CPEP
60.0000 mg | ORAL_CAPSULE | Freq: Every day | ORAL | Status: DC
  Administered 2012-01-14 – 2012-01-18 (×5): 60 mg via ORAL
  Filled 2012-01-13 (×8): qty 1

## 2012-01-13 MED ORDER — ALUM & MAG HYDROXIDE-SIMETH 200-200-20 MG/5ML PO SUSP: 30.0000 mL | ORAL | Status: DC | PRN

## 2012-01-13 MED ORDER — LEVOTHYROXINE SODIUM 50 MCG PO TABS
50.0000 ug | ORAL_TABLET | Freq: Every day | ORAL | Status: DC
  Administered 2012-01-14 – 2012-01-18 (×5): 50 ug via ORAL
  Filled 2012-01-13 (×7): qty 1

## 2012-01-13 MED ORDER — BUTALBITAL-APAP-CAFFEINE 50-325-40 MG PO TABS
1.0000 | ORAL_TABLET | Freq: Four times a day (QID) | ORAL | Status: DC | PRN
  Administered 2012-01-13 – 2012-01-15 (×8): 1 via ORAL
  Filled 2012-01-13 (×8): qty 1

## 2012-01-13 MED ORDER — DULOXETINE HCL 60 MG PO CPEP
60.0000 mg | ORAL_CAPSULE | Freq: Every day | ORAL | Status: DC
  Administered 2012-01-13: 60 mg via ORAL
  Filled 2012-01-13: qty 1

## 2012-01-13 MED ORDER — ACETAMINOPHEN 325 MG PO TABS
650.0000 mg | ORAL_TABLET | Freq: Four times a day (QID) | ORAL | Status: DC | PRN
  Administered 2012-01-15 – 2012-01-18 (×7): 650 mg via ORAL

## 2012-01-13 MED ORDER — BUTALBITAL-APAP-CAFFEINE 50-325-40 MG PO TABS
1.0000 | ORAL_TABLET | Freq: Once | ORAL | Status: AC
  Administered 2012-01-13: 1 via ORAL
  Filled 2012-01-13: qty 1

## 2012-01-13 MED ORDER — LAMOTRIGINE 150 MG PO TABS
150.0000 mg | ORAL_TABLET | Freq: Two times a day (BID) | ORAL | Status: DC
  Administered 2012-01-13: 150 mg via ORAL
  Filled 2012-01-13 (×2): qty 1

## 2012-01-13 MED ORDER — ZOLPIDEM TARTRATE 5 MG PO TABS: 10.0000 mg | ORAL_TABLET | Freq: Every evening | ORAL | Status: DC | PRN

## 2012-01-13 MED ORDER — ASENAPINE MALEATE 5 MG SL SUBL
10.0000 mg | SUBLINGUAL_TABLET | Freq: Every day | SUBLINGUAL | Status: DC
  Filled 2012-01-13 (×4): qty 2

## 2012-01-13 MED ORDER — TIAGABINE HCL 4 MG PO TABS
8.0000 mg | ORAL_TABLET | Freq: Every day | ORAL | Status: DC
  Administered 2012-01-13 – 2012-01-17 (×5): 8 mg via ORAL
  Filled 2012-01-13 (×8): qty 2

## 2012-01-13 MED ORDER — GABAPENTIN 400 MG PO CAPS
800.0000 mg | ORAL_CAPSULE | Freq: Three times a day (TID) | ORAL | Status: DC
  Administered 2012-01-13: 800 mg via ORAL
  Filled 2012-01-13 (×3): qty 2

## 2012-01-13 MED ORDER — LORAZEPAM 1 MG PO TABS
1.0000 mg | ORAL_TABLET | Freq: Once | ORAL | Status: AC
  Administered 2012-01-13: 1 mg via ORAL

## 2012-01-13 MED ORDER — NICOTINE 21 MG/24HR TD PT24: 21.0000 mg | MEDICATED_PATCH | Freq: Every day | TRANSDERMAL | Status: DC

## 2012-01-13 MED ORDER — LEVOTHYROXINE SODIUM 50 MCG PO TABS
50.0000 ug | ORAL_TABLET | Freq: Every day | ORAL | Status: DC
  Filled 2012-01-13 (×2): qty 1

## 2012-01-13 MED ORDER — TIAGABINE HCL 4 MG PO TABS
8.0000 mg | ORAL_TABLET | Freq: Every day | ORAL | Status: DC
  Filled 2012-01-13: qty 2

## 2012-01-13 MED ORDER — ACETAMINOPHEN 325 MG PO TABS: 650.0000 mg | ORAL_TABLET | ORAL | Status: DC | PRN
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
## 2012-01-13 NOTE — ED Notes (Signed)
Report received, assumed care.  

## 2012-01-13 NOTE — ED Notes (Signed)
Tele-psych screen set up for patient.  Waiting on call from doctor.

## 2012-01-13 NOTE — ED Provider Notes (Signed)
Patient has been accepted at [facility].   Dione Booze, MD 01/13/12 [phone]
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
## 2012-01-13 NOTE — ED Notes (Signed)
Spoke with psychiatrist on call for telepsych.  Advised he would be calling back to start telepsych.

## 2012-01-13 NOTE — ED Notes (Signed)
telepsych started

## 2012-01-13 NOTE — BHH Counselor (Signed)
Pt accepted to [facility] 404-2 Shuvon Rankin, NP to Dr. Jannifer Franklin.  Voluntary Consent signed as well as ROI, both faxed to [facility] @ 1401
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
## 2012-01-13 NOTE — ED Notes (Signed)
C/o intermittent panic attack over the past 1 1/2 weeks.  States her dad died 2 weeks ago and her boyfriend is in the hospital.  Reports sob, nausea, heart racing, and chest pain intermittently for 1 1/2 weeks.

## 2012-01-14 ENCOUNTER — Encounter (HOSPITAL_COMMUNITY): Payer: Self-pay | Admitting: Psychiatry

## 2012-01-14 DIAGNOSIS — F319 Bipolar disorder, unspecified: Principal | ICD-10-CM

## 2012-01-14 DIAGNOSIS — F419 Anxiety disorder, unspecified: Secondary | ICD-10-CM | POA: Diagnosis present

## 2012-01-14 DIAGNOSIS — F411 Generalized anxiety disorder: Secondary | ICD-10-CM

## 2012-01-14 DIAGNOSIS — F410 Panic disorder [episodic paroxysmal anxiety] without agoraphobia: Secondary | ICD-10-CM

## 2012-01-14 LAB — URINE CULTURE: Colony Count: 40000

## 2012-01-14 MED ORDER — GABAPENTIN 600 MG PO TABS
600.0000 mg | ORAL_TABLET | Freq: Every day | ORAL | Status: DC
  Administered 2012-01-14 – 2012-01-16 (×3): 600 mg via ORAL
  Filled 2012-01-14 (×4): qty 1

## 2012-01-14 MED ORDER — LAMOTRIGINE 100 MG PO TABS
150.0000 mg | ORAL_TABLET | Freq: Two times a day (BID) | ORAL | Status: DC
  Administered 2012-01-14 – 2012-01-18 (×8): 150 mg via ORAL
  Filled 2012-01-14 (×10): qty 1

## 2012-01-14 MED ORDER — GABAPENTIN 300 MG PO CAPS
300.0000 mg | ORAL_CAPSULE | Freq: Three times a day (TID) | ORAL | Status: DC
  Administered 2012-01-14 – 2012-01-17 (×9): 300 mg via ORAL
  Filled 2012-01-14 (×13): qty 1

## 2012-01-14 MED ORDER — CHLORDIAZEPOXIDE HCL 25 MG PO CAPS
25.0000 mg | ORAL_CAPSULE | Freq: Three times a day (TID) | ORAL | Status: DC | PRN
  Administered 2012-01-14 (×3): 25 mg via ORAL
  Filled 2012-01-14 (×3): qty 1

## 2012-01-14 NOTE — Progress Notes (Signed)
Psychoeducational Group Note  Date:  01/14/2012 Time:  0945 am  Group Topic/Focus:  Making Healthy Choices:   The focus of this group is to help patients identify negative/unhealthy choices they were using prior to admission and identify positive/healthier coping strategies to replace them upon discharge.  Participation Level:  Active  Participation Quality:  Appropriate  Affect:  Anxious  Cognitive:  Alert and Appropriate  Insight:  Good  Engagement in Group:  Good  Additional Comments:    Andrena Mews 01/14/2012, 10:29 AM

## 2012-01-14 NOTE — Progress Notes (Signed)
D: In hallway, requesting to see the RN on approach. Appears anxious and is anxious in conversation. Cooperative with assessment. No acute distress. Pt asked writer to give her what she could have as a PRN without offering her symptoms. Writer was able to establish she needed something for a headache only. She states she has had a good day and feels better after talking with MD about medication changes. She also feels that her headaches are declining in intensity. She denies SI/HI/AVH and contracts for safety. Otherwise offers no questions or concerns.  A: Safety has been maintained with Q15 minute observation. Support and encouragement provided. Meds given as ordered by MD. POC explained and understanding verbalized. PRN provided for pain.   R: Pt is anxious but is maintaining well. She is attending groups and participating appropriately. Will continue to encourage compliance with Depakote. Will f/u response to prn, continue Q15 minute observation and continue current POC.

## 2012-01-14 NOTE — Progress Notes (Signed)
D  Pt is pleasant on approach and is cooperative   She is very focused on her medications and ask for it frequently   She has multiple somatic complaints   She did attend group and participated appropriately     She is depressed and anxious  A   Verbal support given  Medications administered and effectiveness monitored  Q 15 min checks R   Pt safe at present
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
## 2012-01-14 NOTE — Progress Notes (Signed)
Psychoeducational Group Note  Date:  01/14/2012 Time:  2000  Group Topic/Focus:  Wrap-Up Group:   The focus of this group is to help patients review their daily goal of treatment and discuss progress on daily workbooks.  Participation Level:  Active  Participation Quality:  Appropriate  Affect:  Labile  Cognitive:  Appropriate  Insight:  Good  Engagement in Group:  Good  Additional Comments:  Patient attended and participated in group tonight. She reports having a good day. She talked to her doctor, who went over medication. She had a headache all day. She received some medication and received some relief. Patient advised that she is trying to get a support system set up through the ACT team.   Lita Mains [facility] 01/14/2012, 11:53 PM

## 2012-01-14 NOTE — [facility] Suicide Risk Assessment (Signed)
Suicide Risk Assessment  Admission Assessment     Nursing information obtained from:  Patient Demographic factors:  Caucasian;Low socioeconomic status;Unemployed Current Mental Status:  NA Loss Factors:  Loss of significant relationship Historical Factors:  Family history of mental illness or substance abuse;Impulsivity Risk Reduction Factors:  Responsible for children under 44 years of age;Sense of responsibility to family;Religious beliefs about death;Living with another person, especially a relative;Positive social support  CLINICAL FACTORS:   Panic Attacks Bipolar Disorder:   Depressive phase  COGNITIVE FEATURES THAT CONTRIBUTE TO RISK:  Closed-mindedness Thought constriction (tunnel vision)    SUICIDE RISK:   Mild:  Suicidal ideation of limited frequency, intensity, duration, and specificity.  There are no identifiable plans, no associated intent, mild dysphoria and related symptoms, good self-control (both objective and subjective assessment), few other risk factors, and identifiable protective factors, including available and accessible social support.  PLAN OF CARE: Supportive approach/coping skill                              Reassess medications  Markes Shatswell A 01/14/2012, 12:08 PM

## 2012-01-14 NOTE — Clinical Social Work Note (Signed)
[facility] Group Notes:  (Clinical Social Work)  01/14/2012   11:15-11:45AM  Summary of Progress/Problems:  The main focus of today's process group was for the patient to define "support" and describe what healthy supports are, then to identify the patient's current support system and decide on other supports that can be put in place to prevent future hospitalizations.   An emphasis was placed on using therapist, doctor and problem-specific support groups to expand supports. The patient expressed that she does not have many supports here, as her family is not here, and her 9yo daughter is in another state.  Her father just died and she is dealing with that loss.  She feels that healthcare professionals both for her father and herself have been supportive.  She was previously on the [employer], and wants a different ACTT to start.  She has anxiety attacks, and wants to go to support groups where she will have this in common with others.  Type of Therapy:  Group Therapy - Process  Participation Level:  Active  Participation Quality:  Attentive and Sharing  Affect:  Depressed  Cognitive:  Oriented  Insight:  Good  Engagement in Group:  Good  Engagement in Therapy:  Good  Modes of Intervention:  Clarification, Education, Limit-setting, Problem-solving, Socialization, Support and Processing   Ambrose Mantle, LCSW 01/14/2012,

## 2012-01-14 NOTE — Progress Notes (Signed)
Psychoeducational Group Note  Date:  01/14/2012 Time:  1315  Group Topic/Focus:  Therapeutic activity

## 2012-01-15 MED ORDER — CHLORDIAZEPOXIDE HCL 25 MG PO CAPS: 25.0000 mg | ORAL_CAPSULE | Freq: Once | ORAL | Status: AC | PRN

## 2012-01-15 MED ORDER — CHLORDIAZEPOXIDE HCL 25 MG PO CAPS
25.0000 mg | ORAL_CAPSULE | Freq: Two times a day (BID) | ORAL | Status: AC | PRN
  Administered 2012-01-16 (×2): 25 mg via ORAL
  Filled 2012-01-15 (×2): qty 1

## 2012-01-15 MED ORDER — CHLORDIAZEPOXIDE HCL 25 MG PO CAPS
25.0000 mg | ORAL_CAPSULE | Freq: Three times a day (TID) | ORAL | Status: AC | PRN
  Administered 2012-01-15 (×2): 25 mg via ORAL
  Filled 2012-01-15 (×2): qty 1

## 2012-01-15 NOTE — Clinical Social Work Note (Signed)
  Type of Therapy: Process Group Therapy  Participation Level:  Active  Participation Quality:  Appropriate and Attentive  Affect:  Appropriate  Cognitive:  Alert and Appropriate  Insight:  Limited  Engagement in Group:  Good  Engagement in Therapy:  Limited  Modes of Intervention:  Activity, Clarification, Education, Problem-solving and Support  Summary of Progress/Problems: Today's group addressed the issue of overcoming obstacles.  Patients were asked to identify their biggest obstacle post d/c that stands in the way of their on-going success, and then problem solve as to how to manage this. Brittany [name] identified her obstacle as getting stuck in her own thoughts.  She gave the example of people telling her she repeats herself, and then thinking about nothing but that.  "I need to get to the point that I can make my perseverations into afterthoughts." Finds writing things down helpful.      Brittany [name] 01/15/2012   5:56 PM

## 2012-01-15 NOTE — Progress Notes (Signed)
[facility] MD Progress Note  01/15/2012 11:29 AM Brittany [name]  MRN:  [id number]  Diagnosis:  Axis I: Anxiety Disorder NOS and Bipolar, Manic  ADL's:  Intact  Sleep: Fair  Appetite:  Fair  Suicidal Ideation:  Plan:  none Intent:  none Means:  none Homicidal Ideation:  Plan:  none Intent:  none Means:  none  AEB (as evidenced by): Subjective: Patient reports going through some family stressors, has difficulty sleeping and extremely anxious. She reports frequent anxiety attacks, decreased mood swings but still feeling depressed.  She thinks her medications needs to be adjusted.  Mental Status Examination/Evaluation: Objective:  Appearance: Casual  Eye Contact::  Good  Speech:  Pressured  Volume:  Increased  Mood:  Anxious and Euphoric  Affect:  anxious  Thought Process:  Disorganized  Orientation:  Full  Thought Content:  Obsessions  Suicidal Thoughts:  No  Homicidal Thoughts:  No  Memory:  Immediate;   Fair Recent;   Fair Remote;   Fair  Judgement:  Impaired  Insight:  Shallow  Psychomotor Activity:  Increased  Concentration:  Fair  Recall:  Good  Akathisia:  No  Handed:  Right  AIMS (if indicated):     Assets:  Communication Skills  Sleep:  Number of Hours: 5.3    Vital Signs:Blood pressure 136/86, pulse 89, temperature 97.5 F (36.4 C), temperature source Oral, resp. rate 20, height 5\' 4"  (1.626 m), weight 97.977 kg (216 lb). Current Medications: Current Facility-Administered Medications  Medication Dose Route Frequency Provider Last Rate Last Dose  . acetaminophen (TYLENOL) tablet 650 mg  650 mg Oral Q6H PRN Shuvon Rankin, NP      . alum & mag hydroxide-simeth (MAALOX/MYLANTA) 200-200-20 MG/5ML suspension 30 mL  30 mL Oral Q4H PRN Shuvon Rankin, NP      . busPIRone (BUSPAR) tablet 30 mg  30 mg Oral BID WC Shuvon Rankin, NP   30 mg at 01/15/12 0806  . butalbital-acetaminophen-caffeine (FIORICET, ESGIC) 50-325-40 MG per tablet 1 tablet  1 tablet Oral QID PRN  Shuvon Rankin, NP   1 tablet at 01/15/12 1055  . chlordiazePOXIDE (LIBRIUM) capsule 25 mg  25 mg Oral TID PRN Verne Spurr, PA-C       Followed by  . chlordiazePOXIDE (LIBRIUM) capsule 25 mg  25 mg Oral BID PRN Verne Spurr, PA-C       Followed by  . chlordiazePOXIDE (LIBRIUM) capsule 25 mg  25 mg Oral Once PRN Verne Spurr, PA-C      . ciprofloxacin (CIPRO) tablet 500 mg  500 mg Oral BID Shuvon Rankin, NP   500 mg at 01/15/12 0806  . DULoxetine (CYMBALTA) DR capsule 60 mg  60 mg Oral Daily Shuvon Rankin, NP   60 mg at 01/15/12 0806  . gabapentin (NEURONTIN) capsule 300 mg  300 mg Oral TID Rachael Fee, MD   300 mg at 01/15/12 0806  . gabapentin (NEURONTIN) tablet 600 mg  600 mg Oral QHS Rachael Fee, MD   600 mg at 01/14/12 2101  . lamoTRIgine (LAMICTAL) tablet 150 mg  150 mg Oral BID Rachael Fee, MD   150 mg at 01/15/12 0805  . levothyroxine (SYNTHROID, LEVOTHROID) tablet 50 mcg  50 mcg Oral QAC breakfast Shuvon Rankin, NP   50 mcg at 01/15/12 0647  . magnesium hydroxide (MILK OF MAGNESIA) suspension 30 mL  30 mL Oral Daily PRN Shuvon Rankin, NP      . tiaGABine (GABITRIL) tablet 8 mg  8 mg Oral  QHS Shuvon Rankin, NP   8 mg at 01/14/12 2101  . [DISCONTINUED] asenapine (SAPHRIS) sublingual tablet 10 mg  10 mg Sublingual QHS Shuvon Rankin, NP      . [DISCONTINUED] chlordiazePOXIDE (LIBRIUM) capsule 25 mg  25 mg Oral TID PRN Rachael Fee, MD   25 mg at 01/14/12 2100    Lab Results: No results found for this or any previous visit (from the past 48 hour(s)).  Physical Findings: AIMS: Facial and Oral Movements Muscles of Facial Expression: None, normal Lips and Perioral Area: None, normal Jaw: None, normal Tongue: None, normal,Extremity Movements Upper (arms, wrists, hands, fingers): None, normal Lower (legs, knees, ankles, toes): None, normal, Trunk Movements Neck, shoulders, hips: None, normal, Overall Severity Severity of abnormal movements (highest score from questions above):  None, normal Incapacitation due to abnormal movements: None, normal Patient's awareness of abnormal movements (rate only patient's report): No Awareness, Dental Status Current problems with teeth and/or dentures?: No Does patient usually wear dentures?: No  CIWA:    COWS:     Treatment Plan Summary: Daily contact with patient to assess and evaluate symptoms and progress in treatment Medication management  Plan: 1. Will continue current regimen of medications 2. Patient encouraged to attend groups and millieu.  Thedore Mins, MD 01/15/2012, 11:29 AM

## 2012-01-15 NOTE — Progress Notes (Signed)
Patient ID: Brittany [name], female   DOB: July 18, 1965, 46 y.o.   MRN: [id number] She has been up and to groups interacting with peers and staff.She has requested and received two prn's today one for migraine at  10:55 Nd for anxiety at 13:32 both were effective.

## 2012-01-15 NOTE — Clinical Social Work Note (Signed)
[name] attended AM group.  States she is here due to outside stressors.  Fiance is in hospital due to accident.  Father died within last month.  Was working with [employer] for ACT, but she and a worker were at odds and they released her.  She states she is currently between providers.  Wants to get started with another ACT team.  Was living in [place] until 4.5 years ago.  Denies SI.  States primarily needs help with "this state of panic."

## 2012-01-15 NOTE — Progress Notes (Signed)
Psychoeducational Group Note  Date:  01/15/2012 Time:  2000  Group Topic/Focus:  Wrap-Up Group:   The focus of this group is to help patients review their daily goal of treatment and discuss progress on daily workbooks.  Participation Level:  Active  Participation Quality:  Appropriate  Affect:  Labile  Cognitive:  Alert  Insight:  Good  Engagement in Group:  Good  Additional Comments:  Patient attended and participated in group tonight. She reports having a good day.  She attended her groups, meet with the treatment team whom will be working on getting her support in the community to help her work with her stressors. Tomorrow she plans to continue speaking with her doctor regarding her care.  Lita Mains [facility] 01/15/2012, 9:55 PM

## 2012-01-15 NOTE — Progress Notes (Signed)
Psychoeducational Group Note  Date:  01/15/2012 Time: 1100  Group Topic/Focus:  Self Care:   The focus of this group is to help patients understand the importance of self-care in order to improve or restore emotional, physical, spiritual, interpersonal, and financial health.  Participation Level:  Active  Participation Quality:  Appropriate, Sharing and Supportive  Affect:  Appropriate  Cognitive:  Appropriate  Insight:  Good  Engagement in Group:  Good  Additional Comments:  none  Nioma Mccubbins M 01/15/2012, 11:52 AM

## 2012-01-15 NOTE — Treatment Plan (Signed)
Interdisciplinary Treatment Plan Update (Adult)  Date: 01/15/2012  Time Reviewed: 9:54 AM   Progress in Treatment: Attending groups: Yes Participating in groups: Yes Taking medication as prescribed: Yes Tolerating medication: Yes   Family/Significant other contact made: No  Patient understands diagnosis:  Yes  As evidenced by asking for help with overwhelming stress and getting hooked up with a new ACT team Discussing patient identified problems/goals with staff:  Yes  See below Medical problems stabilized or resolved:  Yes Denies suicidal/homicidal ideation: Yes  In tx team Issues/concerns per patient self-inventory:  None noted Other:  New problem(s) identified: N/A  Reason for Continuation of Hospitalization: Anxiety Medication stabilization  Interventions implemented related to continuation of hospitalization:  Prune down medications,  Investigate the reason for dismissal from ACT team, and identify another ACT group  Encourage group attendance and participation   Additional comments:  Estimated length of stay: 2-3 days  Discharge Plan:  Return home, follow up with ACT team  New goal(s): N/A  Review of initial/current patient goals per problem list:   1.  Goal(s): Medication stabilization  Met:  Yes  Target date:12/5  As evidenced [id number] changes to decrease number of meds, and to address symptoms, starting today  2.  Goal (s):Stabilize mood   Met:  No  Target date:12/5  As evidenced by: Self report of 3 or less on anxiety  3.  Goal(s): Link with new ACT team  Met:  No  Target date:12/5  As evidenced by: confirmation of date, time  4.  Goal(s):  Met:  No  Target date:  As evidenced by:  Attendees: Patient:  Brittany [name] 01/15/2012 9:54 AM  Family:     Physician:  Thedore Mins 01/15/2012 9:54 AM   Nursing: Roswell Miners 01/15/2012 9:54 AM   Clinical Social Worker:  Richelle Ito 01/15/2012 9:54 AM   Extender:  Verne Spurr PA 01/15/2012  9:54 AM   Other:     Other:     Other:     Other:      Scribe for Treatment Team:   [name], 01/15/2012 9:54 AM
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
## 2012-01-16 MED ORDER — HYDROXYZINE HCL 25 MG PO TABS
25.0000 mg | ORAL_TABLET | Freq: Four times a day (QID) | ORAL | Status: DC | PRN
  Administered 2012-01-16 – 2012-01-17 (×2): 25 mg via ORAL

## 2012-01-16 NOTE — Progress Notes (Signed)
Psychoeducational Group Note  Date:  01/16/2012 Time:  0930  Group Topic/Focus:  Recovery Goals:   The focus of this group is to identify appropriate goals for recovery and establish a plan to achieve them.  Participation Level:  Minimal  Participation Quality:  Appropriate  Affect:  Appropriate  Cognitive:  Appropriate  Insight:  Limited  Engagement in Group:  Limited  Additional Comments:  Pt was able to attend group this morning but did not participate much.  Vassie Kugel E 01/16/2012, 11:43 AM
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
## 2012-01-16 NOTE — Progress Notes (Signed)
D: Patient denies SI/HI and A/V hallucinations; patient reports sleep to be well; reports appetite to be good ; reports energy level is normal ; reports ability to pay attention to be good; rates depression as 4/10; rates hopelessness 0/10;   A: Monitored q 15 minutes; patient encouraged to attend groups; patient educated about medications; patient given medications per physician orders; patient encouraged to express feelings and/or concerns; prn medications for headache and withdrawal  R: Patient remains anxious and sad; patient's interaction with staff and peers is assertive but appropriate;patient is taking medications as prescribed and tolerating medications; patient is attending all groups

## 2012-01-16 NOTE — Clinical Social Work Note (Signed)
Spoke with [facility] with patients permission who described her as controlled substance dependant, manipulative and sneaky.  They terminated services and referred her to another ACT team.  She sabotaged that by refusing to meet with them in an attempt to continue to get services from [facility].  She lives in a half way house where she is required to attend AA groups, and she is compliant, but denies it is an issue for her.Called Tobi at [facility] who is willing to interview if she is getting her medicaid changed to [facility].  Contacted half way house manager who assured me it is under way, and as far as he knows everything has been done.  I will follow up with patient and then call Tobi back.

## 2012-01-16 NOTE — Progress Notes (Signed)
Psychoeducational Group Note  Date:  01/16/2012 Time:  2000  Group Topic/Focus:  Wrap-Up Group:   The focus of this group is to help patients review their daily goal of treatment and discuss progress on daily workbooks.  Participation Level:  Active  Participation Quality:  Appropriate, Attentive, Sharing and Supportive  Affect:  Anxious, Appropriate and Depressed  Cognitive:  Oriented  Insight:  Engaged  Engagement in Group:  Supportive  Additional Comments:  Patient shared that she is experiencing some grieving emotions and some major stressors in her life but she has been attempting to distract herself with drawing and trying to employ some healthy coping skills to deal with her anxiety.  Keen Ewalt, Newton Pigg 01/16/2012, 8:47 PM

## 2012-01-17 MED ORDER — HYDROXYZINE HCL 25 MG PO TABS
25.0000 mg | ORAL_TABLET | Freq: Four times a day (QID) | ORAL | Status: DC | PRN
  Administered 2012-01-18: 25 mg via ORAL

## 2012-01-17 MED ORDER — GABAPENTIN 400 MG PO CAPS
400.0000 mg | ORAL_CAPSULE | Freq: Three times a day (TID) | ORAL | Status: DC
  Administered 2012-01-17 – 2012-01-18 (×4): 400 mg via ORAL
  Filled 2012-01-17 (×6): qty 1

## 2012-01-17 NOTE — Progress Notes (Signed)
Patient ID: Brittany [name], female   DOB: 03-26-1965, 46 y.o.   MRN: [id number] [facility] MD Progress Note  01/17/2012 3:08 PM Lovette Merta  MRN:  [id number]   Diagnosis:  Bipolar disorder, Anxiety disorder, hx of Substance abuse                      Medication seeking behaviors  Subjective: Met with the patient 1:1 today to discuss her response to treatment.  [name] immediately asks about using "hydroxyzine" for her anxiety and panic attacks which she describes as "severe and horrible," causing her to cry and shake for over an hour. She reports she had two panic attacks yesterday before she asked for assistance from the nursing staff.     Nursing notes do not support this. [name] denies being a substance abuser, and states that she was sent to the Recovery house by her ACT team because she was homeless and had no where else to go. She also denies stating that she over took her medication and says that even though she had been taking Saphris she had an adverse reaction to it while visiting her BF in the hospital and that's how she ended up in the hospital.         ADL's:  Intact  Sleep: Good  Appetite:  Good  Suicidal Ideation:  denies Homicidal Ideation:  denies ROS: Constitutional:  WDWN  NAD ENT:     Negative runny nose, cough, congestion, sore throat. Resp:    Negative wheezing, cough, SOB COR:    Negative chest pain, palpitations, weakness GI:         Negative nausea, vomiting, diarrhea MSK:     Negative pain, swelling, loss of function Neuro:   Negative dizziness, headache, numbness GU:        Negative dysuria, discharge, frequency urgency Derm:    Negative rashes, lumps, bumps. Psych:   Negative      Mental Status Examination/Evaluation: Objective:  Appearance: Fairly Groomed  Eye Contact::  Good  Speech:  Normal Rate  Volume:  Normal  Mood:  Anxious by report, euthymic by appearance  Affect:  Grandiose, full range, labile, manipulative, tearful  Thought Process:  Coherent,  Goal Directed, Intact and Logical  Orientation:  Full  Thought Content:  Patient's version of her history keeps changing. She is not a reliable historian.  Suicidal Thoughts:  No  Homicidal Thoughts:  No  Memory:  Immediate;   Fair  Judgement:  Impaired  Insight:  Lacking  Psychomotor Activity:  Normal  Concentration:  Fair  Recall:  Fair  Akathisia:  No  Handed:    AIMS (if indicated):     Assets:  Housing Social Support  Sleep:  Number of Hours: 6.5    Vital Signs:Blood pressure 122/84, pulse 80, temperature 97 F (36.1 C), temperature source Oral, resp. rate 24, height 5\' 4"  (1.626 m), weight 97.977 kg (216 lb). Current Medications: Current Facility-Administered Medications  Medication Dose Route Frequency Provider Last Rate Last Dose  . acetaminophen (TYLENOL) tablet 650 mg  650 mg Oral Q6H PRN Shuvon Rankin, NP   650 mg at 01/17/12 0748  . alum & mag hydroxide-simeth (MAALOX/MYLANTA) 200-200-20 MG/5ML suspension 30 mL  30 mL Oral Q4H PRN Shuvon Rankin, NP      . busPIRone (BUSPAR) tablet 30 mg  30 mg Oral BID WC Shuvon Rankin, NP   30 mg at 01/17/12 1140  . [EXPIRED] chlordiazePOXIDE (LIBRIUM) capsule 25 mg  25 mg Oral BID  PRN Verne Spurr, PA-C   25 mg at 01/16/12 2128   Followed by  . [EXPIRED] chlordiazePOXIDE (LIBRIUM) capsule 25 mg  25 mg Oral Once PRN Verne Spurr, PA-C      . ciprofloxacin (CIPRO) tablet 500 mg  500 mg Oral BID Shuvon Rankin, NP   500 mg at 01/17/12 0744  . DULoxetine (CYMBALTA) DR capsule 60 mg  60 mg Oral Daily Shuvon Rankin, NP   60 mg at 01/17/12 0744  . gabapentin (NEURONTIN) capsule 400 mg  400 mg Oral TID Verne Spurr, PA-C   400 mg at 01/17/12 1141  . hydrOXYzine (ATARAX/VISTARIL) tablet 25 mg  25 mg Oral Q6H PRN Norval Gable, NP   25 mg at 01/17/12 1305  . lamoTRIgine (LAMICTAL) tablet 150 mg  150 mg Oral BID Rachael Fee, MD   150 mg at 01/17/12 0744  . levothyroxine (SYNTHROID, LEVOTHROID) tablet 50 mcg  50 mcg Oral QAC breakfast  Shuvon Rankin, NP   50 mcg at 01/17/12 9604  . magnesium hydroxide (MILK OF MAGNESIA) suspension 30 mL  30 mL Oral Daily PRN Shuvon Rankin, NP      . tiaGABine (GABITRIL) tablet 8 mg  8 mg Oral QHS Shuvon Rankin, NP   8 mg at 01/16/12 2127  . [DISCONTINUED] gabapentin (NEURONTIN) capsule 300 mg  300 mg Oral TID Rachael Fee, MD   300 mg at 01/17/12 0744  . [DISCONTINUED] gabapentin (NEURONTIN) tablet 600 mg  600 mg Oral QHS Rachael Fee, MD   600 mg at 01/16/12 2127    Lab Results: No results found for this or any previous visit (from the past 48 hour(s)).  Physical Findings: AIMS: Facial and Oral Movements Muscles of Facial Expression: None, normal Lips and Perioral Area: None, normal Jaw: None, normal Tongue: None, normal,Extremity Movements Upper (arms, wrists, hands, fingers): None, normal Lower (legs, knees, ankles, toes): None, normal, Trunk Movements Neck, shoulders, hips: None, normal, Overall Severity Severity of abnormal movements (highest score from questions above): None, normal Incapacitation due to abnormal movements: None, normal Patient's awareness of abnormal movements (rate only patient's report): No Awareness, Dental Status Current problems with teeth and/or dentures?: No Does patient usually wear dentures?: No  CIWA:    COWS:     Treatment Plan Summary: 1. Continue current admission with goal to plan of care to address the patient's immediate needs, possible substance abuse, and medication seeking behaviors, that will decrease the risk of relapse upon discharge and the need for readmission. 2. Will continue to decrease polypharmacy. 3. Will offer substance abuse treatment, detox, and rehab, but it appears the patient is not interested or motivated to stop abusing medications. Plan: 1. Will continue to minimize the number of medications this patient takes. 2. CM will have the patient to meet with the ACT team for further care. 3. As the patient does not want or  feel the need for SA treatment, and is denying SI/HI and is in full contact with reality, would anticipate discharge out tomorrow. 4. Vistaril will be continued for 24 hours, 25mg  po q 6hrs. Prn anxiety. Rona Ravens. Brady Schiller PAC 01/17/2012, 3:08 PM

## 2012-01-17 NOTE — Progress Notes (Signed)
Psychoeducational Group Note  Date:  01/17/2012 Time:  2000  Group Topic/Focus:  Wrap-Up Group:   The focus of this group is to help patients review their daily goal of treatment and discuss progress on daily workbooks.  Participation Level: Did Not Attend  Participation Quality:  Not Applicable  Affect:  Not Applicable  Cognitive:  Not Applicable  Insight:  Not Applicable  Engagement in Group: Not Applicable  Additional Comments:  Pt was informed three times that group was about to start. Pt stayed in the bed.  Kaleen Odea R 01/17/2012, 8:40 PM

## 2012-01-17 NOTE — Progress Notes (Signed)
[facility] LCSW Aftercare Discharge Planning Group Note  01/17/2012 9:19 AM  Participation Quality:  Appropriate  Affect:  Appropriate  Cognitive:  Alert and Appropriate  Insight:  Developing/Improving  Engagement in Group:  Engaged  Modes of Intervention:  Clarification and Support  Summary of Progress/Problems: Pt asked about status of [facility] team. CSW talked to pt about issue with Medicaid and pt explained that she called [employer] in [place] to provide them with change of address and was told that process should take 5-7 business days. Pt stated that she was highly motivated to follow through with aftercare plan and was interested in bereavement counseling. Pt showed group her latest art project and said that it was a good mental distraction for her.   Smart, Heather N 01/17/2012, 9:19 AM

## 2012-01-17 NOTE — Clinical Social Work Note (Signed)
[facility] Group Notes:  (Counselor/Nursing/MHT/Case Management/Adjunct)  12/29/2011 12:00 PM  Type of Therapy:  Group Therapy  Participation Level:  Active  Participation Quality:  Attentive, Redirectable, Sharing and Supportive  Affect:  Excited  Cognitive:  Appropriate and Oriented  Insight:  Improving  Engagement in Group:  Supportive  Engagement in Therapy:  Engaged  Modes of Intervention:  Discussion, Socialization and Support  Summary of Progress/Problems: MHA: Patient was attentive and engaged with speaker from [employer]. At one point, she politely asked another pt to stop interrupting speaker, which caused small verbal altercation. Pt removed herself from group for a few minutes and was encouraged by SW intern to rejoin group after calming down. Pt came back to group a few minutes later. She expressed interest in [facility] programs and services. Patient processed ways they can relate to the speaker and expressed interest in music when [facility] speaker played guitar.    Smart, Heather N 12/29/2011, 12:00 PM

## 2012-01-17 NOTE — Clinical Social Work Note (Signed)
  Aftercare Planning Group on 01/16/2012   Pt attended discharge planning group and actively participated in group.  CSW provided pt with today's workbook.  Pt presented with appropriate affect and shared that she had no SI or HI.  Patient shared that one thing she has been thinking of that she can control "is being more positive about life situations; I know that when I am thinking negatively things seem to go downhill."  Patient rated her depression at a 4-5 and her anxiety at a 6-7; both on a scale of 1 to 10 with 10 being the highest.  Carney Bern, [facility] Clinical Social Worker [phone]

## 2012-01-17 NOTE — Progress Notes (Signed)
Patient ID: Brittany [name], female   DOB: 13-Feb-1966, 46 y.o.   MRN: [id number] D: Pt. Lying down in quiet room, writer bring her back to hall for group. Pt. Reports she like being in quiet room to give her room mate some privacy. A: Writer encouraged pt. To attend group. Writer explained that quiet room was for clients in distress.  Staff will monitor q37min for safety. R: pt. Did not attend group, but went to room to lie down. Pt. Reports she understand the use of quiet room. Pt. Is safe on the unit.

## 2012-01-17 NOTE — Progress Notes (Signed)
D: Patient denies SI/HI and A/V hallucinations; patient reports sleep to be well; reports appetite to be good ; reports energy level is low ; reports ability to pay attention is good; rates depression as 4/10; rates hopelessness 4/10; patient reports panic attacks and vital signs : bp 123/82, pulse 72 and bp 122/84 and pulse 80  A: Monitored q 15 minutes; patient encouraged to attend groups; patient educated about medications; patient given medications per physician orders; patient encouraged to express feelings and/or concerns  R: Patient is cooperative and attention seeking; patient's interaction with staff and peers is appropriate; patient is taking medications as prescribed and tolerating medications; patient is attending all groups

## 2012-01-18 DIAGNOSIS — F603 Borderline personality disorder: Secondary | ICD-10-CM | POA: Diagnosis present

## 2012-01-18 DIAGNOSIS — F319 Bipolar disorder, unspecified: Secondary | ICD-10-CM | POA: Diagnosis present

## 2012-01-18 DIAGNOSIS — Z79899 Other long term (current) drug therapy: Secondary | ICD-10-CM

## 2012-01-18 MED ORDER — GABAPENTIN 400 MG PO CAPS: 400.0000 mg | ORAL_CAPSULE | Freq: Three times a day (TID) | ORAL | Status: DC

## 2012-01-18 MED ORDER — DULOXETINE HCL 60 MG PO CPEP: 60.0000 mg | ORAL_CAPSULE | Freq: Every day | ORAL | Status: DC

## 2012-01-18 MED ORDER — BUSPIRONE HCL 30 MG PO TABS: 30.0000 mg | ORAL_TABLET | Freq: Two times a day (BID) | ORAL | Status: DC

## 2012-01-18 MED ORDER — LEVOTHYROXINE SODIUM 50 MCG PO TABS: 50.0000 ug | ORAL_TABLET | Freq: Every day | ORAL | Status: DC

## 2012-01-18 MED ORDER — TIAGABINE HCL 4 MG PO TABS: 8.0000 mg | ORAL_TABLET | Freq: Every day | ORAL | Status: DC

## 2012-01-18 MED ORDER — CIPROFLOXACIN HCL 500 MG PO TABS: 500.0000 mg | ORAL_TABLET | Freq: Two times a day (BID) | ORAL | Status: DC

## 2012-01-18 MED ORDER — LAMOTRIGINE 150 MG PO TABS: 150.0000 mg | ORAL_TABLET | Freq: Two times a day (BID) | ORAL | Status: DC

## 2012-01-18 NOTE — [facility] Suicide Risk Assessment (Signed)
Suicide Risk Assessment  Discharge Assessment     Demographic Factors:  female  Mental Status Per Nursing Assessment::   On Admission:  NA  Current Mental Status by Physician: patient denies suicidal ideations, intent or plan  Loss Factors: Financial problems/change in socioeconomic status  Historical Factors: patient denies suicidal ideations, intent or plan  Risk Reduction Factors:   NA  Continued Clinical Symptoms:  Resolving anxiety and depression  Cognitive Features That Contribute To Risk:  Closed-mindedness Polarized thinking    Suicide Risk:  Minimal: No identifiable suicidal ideation.  Patients presenting with no risk factors but with morbid ruminations; may be classified as minimal risk based on the severity of the depressive symptoms  Discharge Diagnoses:   AXIS I:  Bipolar, Depressed, Anxiety disorder, NOS AXIS II:  Borderline Personality Dis. AXIS III:   Past Medical History  Diagnosis Date  . Hypertension   . Back pain   . Bulging disc   . Ruptured intervertebral disc   . Migraine    AXIS IV:  housing problems, other psychosocial or environmental problems and problems related to social environment AXIS V:  61-70 mild symptoms  Plan Of Care/Follow-up recommendations:  Activity:  as tolerated Diet:  healthy Tests:  routine Other:  patient to keep her follow up care and take her medications as recommended.  Is patient on multiple antipsychotic therapies at discharge:  No   Has Patient had three or more failed trials of antipsychotic monotherapy by history:  No  Recommended Plan for Multiple Antipsychotic Therapies: N/A  Thedore Mins, MD 01/18/2012, 10:15 AM

## 2012-01-18 NOTE — Progress Notes (Signed)
D:  Patient discharged to home.  Retrieved all belongings from room and from locker in the search room.  Denies suicidal ideation or severe depression at this time.  Patient was given a three day supply of medications from the hospital pharmacy.   A:  Reviewed all discharge instructions, medications, and follow up care.  Patient was given medications and information sheets.  She was escorted off the unit and out to the front lobby where her friend was waiting to take her home.   R:  Verbalized understanding of all instructions and medications.  Patient states she is ready to leave the hospital.

## 2012-01-18 NOTE — Discharge Summary (Signed)
Physician Discharge Summary Note  Patient:  Brittany [name] is an 46 y.o., female MRN:  [id number] DOB:  [birth year] Patient phone:  [phone] (home)  Patient address:   [address] [place] [address],   Date of Admission:  01/13/2012 Date of Discharge: 01/18/2012  Reason for Admission:  Suicidal ideation  Discharge Diagnoses: Principal Problem:  *Bipolar affective Active Problems:  Polypharmacy  Anxiety disorder  Borderline personality disorder  Review of Systems  Constitutional: Negative for fever, chills, weight loss and diaphoresis.  Gastrointestinal: Negative for nausea, vomiting and diarrhea.  Genitourinary: Negative for dysuria, urgency and frequency.  Musculoskeletal: Positive for back pain (her normal back pain, no new complaints or changes). Negative for myalgias and joint pain.  Skin: Negative for rash.  Neurological: Negative for dizziness, tingling, weakness and headaches.  Psychiatric/Behavioral: Negative for depression, suicidal ideas, hallucinations, memory loss and substance abuse. The patient is not nervous/anxious and does not have insomnia.    Axis Diagnosis:   AXIS I:  Bipolar affective disorder, Anxiety, Polypharmacy AXIS II:  Borderline Personality Dis. AXIS III:   Past Medical History  Diagnosis Date  . Hypertension   . Bipolar 1 disorder   . Anxiety   . Back pain   . Bulging disc   . Ruptured intervertebral disc   . Migraine   AXIS IV:  economic problems, housing problems, other psychosocial or environmental problems, problems with access to health care services and problems with primary support group AXIS V:  41-50 serious symptoms  Level of Care:  OP  Hospital Course:  Brittany [name] was admitted for suicidal ideation and polypharmacy after being found in an altered mental status at her recovery house.  Her story changed many times during the course of her stay and she was an unreliable historian.  Review of records indicated a  number  of medications prescribed by 2 physicians whom she reports she no longer sees, and in [place] for [place] same problems. One she noted as her PCP the other as her Psychiatrist.  Brittany [name] vehemently denied abusing her medication or having a substance abuse problem.     Brittany [name] was treated by discontinuing several medications and placing her on a Librium detox.  Her Fioricet was discontinued as well. This was written as PRN, and she was taking it daily.  She was offered treatment for substance abuse but denied that she had a problem.  Her response to treatment was documented by daily visits by a clinical provider, and daily self inventories completed by the patient assessing her mood and emotional status.  Consults:  None  Significant Diagnostic Studies:  None  Discharge Vitals:   Blood pressure 139/87, pulse 66, temperature 97.2 F (36.2 C), temperature source Oral, resp. rate 16, height 5\' 4"  (1.626 m), weight 97.977 kg (216 lb). Body mass index is 37.08 kg/(m^2). Lab Results:   No results found for this or any previous visit (from the past 72 hour(s)).  Physical Findings: AIMS: Facial and Oral Movements Muscles of Facial Expression: None, normal Lips and Perioral Area: None, normal Jaw: None, normal Tongue: None, normal,Extremity Movements Upper (arms, wrists, hands, fingers): None, normal Lower (legs, knees, ankles, toes): None, normal, Trunk Movements Neck, shoulders, hips: None, normal, Overall Severity Severity of abnormal movements (highest score from questions above): None, normal Incapacitation due to abnormal movements: None, normal Patient's awareness of abnormal movements (rate only patient's report): No Awareness, Dental Status Current problems with teeth and/or dentures?: No Does patient usually wear dentures?: No  CIWA:  COWS:     Psychiatric Specialty Exam: See Psychiatric Specialty Exam and Suicide Risk Assessment completed by Attending Physician prior to  discharge.  Discharge destination:  Home  Is patient on multiple antipsychotic therapies at discharge:  No   Has Patient had three or more failed trials of antipsychotic monotherapy by history:  No  Recommended Plan for Multiple Antipsychotic Therapies: not applicable  Discharge Orders    Future Orders Please Complete By Expires   Diet - low sodium heart healthy      Increase activity slowly      Discharge instructions      Comments:   Take all of your medications as prescribed.  Be sure to keep ALL follow up appointments as scheduled. This is to ensure getting your refills on time to avoid any interruption in your medication.  If you find that you can not keep your appointment, call the clinic and reschedule. Be sure to tell the nurse if you will need a refill before your appointment.       Medication List     As of 01/18/2012  9:30 AM    STOP taking these medications         ALPRAZolam 1 MG tablet   Commonly known as: XANAX      asenapine 5 MG Subl   Commonly known as: SAPHRIS      butalbital-acetaminophen-caffeine 50-325-40 MG per tablet   Commonly known as: FIORICET, ESGIC      gabapentin 800 MG tablet   Commonly known as: NEURONTIN      phentermine 15 MG capsule      PROPRANOLOL HCL PO      SOMA 250 MG tablet   Generic drug: carisoprodol      traMADol 50 MG tablet   Commonly known as: ULTRAM      TAKE these medications      Indication    busPIRone 30 MG tablet   Commonly known as: BUSPAR   Take 1 tablet (30 mg total) by mouth 2 (two) times daily with breakfast and lunch. For anxiety and panic attacks.    Indication: Anxiety Disorder      ciprofloxacin 500 MG tablet   Commonly known as: CIPRO   Take 1 tablet (500 mg total) by mouth 2 (two) times daily. For infection.    Indication: Bladder Inflammation      DULoxetine 60 MG capsule   Commonly known as: CYMBALTA   Take 1 capsule (60 mg total) by mouth daily. For anxiety and depression.    Indication:  Major Depressive Disorder      gabapentin 400 MG capsule   Commonly known as: NEURONTIN   Take 1 capsule (400 mg total) by mouth 3 (three) times daily. For anxiety.    Indication: Agitation, Headache, Treatment to Prevent Migraine Headaches, Neurogenic Pain      lamoTRIgine 150 MG tablet   Commonly known as: LAMICTAL   Take 1 tablet (150 mg total) by mouth 2 (two) times daily. For mood stabilization.    Indication: Depressive Phase of Manic-Depression      levothyroxine 50 MCG tablet   Commonly known as: SYNTHROID, LEVOTHROID   Take 1 tablet (50 mcg total) by mouth daily before breakfast. For hypothyroidism.    Indication: Underactive Thyroid      tiaGABine 4 MG tablet   Commonly known as: GABITRIL   Take 2 tablets (8 mg total) by mouth at bedtime. For mood stabilization.    Indication: mood stabilization.  Follow-up Information    Follow up with [facility]. On 01/23/2012. (Go in between 8 and 9 for your hospital follow up appointment)    Contact information:   [address]  [place]  [phone]      Follow up with [facility] ACT team. (Contact them when you have confirmation of change of  MCD.  They will be glad to work with you once that happens)    Contact information:   [address]  [place]  [phone]         Follow-up recommendations:  As noted above  Comments:    Total Discharge Time:  Less than  Signed: Lloyd Huger T. Asher Babilonia PAC 01/18/2012, 9:30 AM

## 2012-01-18 NOTE — Progress Notes (Signed)
[facility] LCSW Aftercare Discharge Planning Group Note  01/18/2012 9:28 AM  Participation Quality: DID NOT ATTEND   Smart, Brittany [name] 01/18/2012, 9:28 AM

## 2012-01-18 NOTE — Progress Notes (Signed)
[facility] Group Notes:  (Counselor/Nursing/MHT/Case Management/Adjunct)  01/18/2012 12:26 PM  Type of Therapy:  Psychoeducational Skills  Participation Level:  Minimal  Participation Quality:  Appropriate  Affect:  Blunted  Cognitive:  Oriented  Insight:  Limited  Engagement in Group:  Limited  Engagement in Therapy:  n/a  Modes of Intervention:  Activity, Education and Socialization  Summary of Progress/Problems:  [name] participated in sharing activity with group. [name] stated that one goal she would like to accomplish is today is to find follow up care when she leaves the hospital, her favorite holiday food is ham, and one thing that makes her unique is that she dyes her hair. [name] participated in activity writing something unique about herself on a snowflake and hung it in the hall "tree".   Wandra Scot 01/18/2012, 12:26 PM

## 2012-01-18 NOTE — Progress Notes (Signed)
[facility] Adult Case Management Discharge Plan :  Will you be returning to the same living situation after discharge: Yes,  half way house At discharge, do you have transportation home?:Yes,  [place] from half way house Do you have the ability to pay for your medications:Yes,  mental health  Release of information consent forms completed and in the chart;  Patient's signature needed at discharge.  Patient to Follow up at: Follow-up Information    Follow up with [facility]. On 01/23/2012. (Go in between 8 and 9 for your hospital follow up appointment)    Contact information:   [address]  [place]  [phone]      Follow up with [facility] ACT team. (Contact them when you have confirmation of change of  MCD.  They will be glad to work with you once that happens)    Contact information:   [address] [facility]  [phone]         Patient denies SI/HI:   Yes,  yes    Safety Planning and Suicide Prevention discussed:  Yes,  yes  Daryel Gerald B 01/18/2012, 10:25 AM

## 2012-01-19 NOTE — Discharge Summary (Signed)
Seen and agreed. [place] Kuang, MD 

## 2012-01-22 NOTE — Progress Notes (Signed)
Patient Discharge Instructions:  After Visit Summary (AVS):   Faxed to:  01/22/12 Psychiatric Admission Assessment Note:   Faxed to:  01/22/12 Suicide Risk Assessment - Discharge Assessment:   Faxed to:  01/22/12 Faxed/Sent to the Next Level Care provider:  01/22/12 Faxed to [facility] @ [phone] Faxed to [facility] @ [phone]   Jerelene Redden, 01/22/2012, 3:19 PM

## 2012-03-13 ENCOUNTER — Emergency Department (HOSPITAL_COMMUNITY): Payer: Medicaid Other

## 2012-03-13 ENCOUNTER — Emergency Department (HOSPITAL_COMMUNITY)
Admission: EM | Admit: 2012-03-13 | Discharge: 2012-03-13 | Disposition: A | Discharge status: Home / Self Care | Payer: Medicaid Other | Attending: Emergency Medicine | Admitting: Emergency Medicine

## 2012-03-13 ENCOUNTER — Encounter (HOSPITAL_COMMUNITY): Payer: Self-pay

## 2012-03-13 DIAGNOSIS — W010XXA Fall on same level from slipping, tripping and stumbling without subsequent striking against object, initial encounter: Secondary | ICD-10-CM | POA: Insufficient documentation

## 2012-03-13 DIAGNOSIS — Z765 Malingerer [conscious simulation]: Secondary | ICD-10-CM | POA: Insufficient documentation

## 2012-03-13 DIAGNOSIS — F309 Manic episode, unspecified: Secondary | ICD-10-CM | POA: Insufficient documentation

## 2012-03-13 DIAGNOSIS — IMO0002 Reserved for concepts with insufficient information to code with codable children: Secondary | ICD-10-CM | POA: Insufficient documentation

## 2012-03-13 DIAGNOSIS — Y9389 Activity, other specified: Secondary | ICD-10-CM | POA: Insufficient documentation

## 2012-03-13 DIAGNOSIS — F411 Generalized anxiety disorder: Secondary | ICD-10-CM | POA: Insufficient documentation

## 2012-03-13 DIAGNOSIS — G8929 Other chronic pain: Secondary | ICD-10-CM | POA: Insufficient documentation

## 2012-03-13 DIAGNOSIS — F1910 Other psychoactive substance abuse, uncomplicated: Secondary | ICD-10-CM | POA: Insufficient documentation

## 2012-03-13 DIAGNOSIS — S3992XA Unspecified injury of lower back, initial encounter: Secondary | ICD-10-CM

## 2012-03-13 DIAGNOSIS — Y92009 Unspecified place in unspecified non-institutional (private) residence as the place of occurrence of the external cause: Secondary | ICD-10-CM | POA: Insufficient documentation

## 2012-03-13 DIAGNOSIS — Z79899 Other long term (current) drug therapy: Secondary | ICD-10-CM | POA: Insufficient documentation

## 2012-03-13 DIAGNOSIS — I10 Essential (primary) hypertension: Secondary | ICD-10-CM | POA: Insufficient documentation

## 2012-03-13 DIAGNOSIS — Z8679 Personal history of other diseases of the circulatory system: Secondary | ICD-10-CM | POA: Insufficient documentation

## 2012-03-13 MED ORDER — OXYCODONE-ACETAMINOPHEN 5-325 MG PO TABS
1.0000 | ORAL_TABLET | Freq: Once | ORAL | Status: AC
  Administered 2012-03-13: 1 via ORAL
  Filled 2012-03-13: qty 1

## 2012-03-13 MED ORDER — IBUPROFEN 600 MG PO TABS: 600.0000 mg | ORAL_TABLET | Freq: Four times a day (QID) | ORAL | Status: DC | PRN

## 2012-03-13 NOTE — ED Provider Notes (Signed)
History     CSN: [id number]  Arrival date & time 03/13/12  1654   First MD Initiated Contact with Patient 03/13/12 1704      Chief Complaint  Patient presents with  . Fall  . Back Pain    (Consider location/radiation/quality/duration/timing/severity/associated sxs/prior treatment) HPI  47 year old female with history of bipolar, drug-seeking behavior, and chronic back pain presents complaining of left low back pain. Patient reports she was trying to feed her dogs when she slipped and fell backward on the snow in her back porch and landed on wooden stair.  She hits her back against the stair and experienced sharp shooting pain from back to her buttock.  Pain is 10/10, severe.  Denies hitting head or LOC.  She was able to ambulate back in her house to call EMS.  Pt denies numbness, weakness, abd pain, cp or trouble breathing.  Pt requesting pain medication.  Denies any recent alcohol use  Past Medical History  Diagnosis Date  . Hypertension   . Bipolar 1 disorder   . Anxiety   . Back pain   . Bulging disc   . Ruptured intervertebral disc   . Migraine     Past Surgical History  Procedure Date  . Abdominal hysterectomy     History reviewed. No pertinent family history.  History  Substance Use Topics  . Smoking status: Never Smoker   . Smokeless tobacco: Not on file  . Alcohol Use: No    OB History    Grav Para Term Preterm Abortions TAB SAB Ect Mult Living                  Review of Systems  Constitutional:       10 Systems reviewed and all are negative for acute change except as noted in the HPI.     Allergies  Toradol  Home Medications   Current Outpatient Rx  Name  Route  Sig  Dispense  Refill  . BUSPIRONE HCL 30 MG PO TABS   Oral   Take 1 tablet (30 mg total) by mouth 2 (two) times daily with breakfast and lunch. For anxiety and panic attacks.   60 tablet   0   . CARISOPRODOL 350 MG PO TABS   Oral   Take 350 mg by mouth 3 (three) times daily as  needed. For pain.         . DULOXETINE HCL 60 MG PO CPEP   Oral   Take 1 capsule (60 mg total) by mouth daily. For anxiety and depression.   30 capsule   0   . GABAPENTIN 400 MG PO CAPS   Oral   Take 1 capsule (400 mg total) by mouth 3 (three) times daily. For anxiety.   90 capsule   0   . LAMOTRIGINE 150 MG PO TABS   Oral   Take 1 tablet (150 mg total) by mouth 2 (two) times daily. For mood stabilization.   60 tablet   0   . LEVOTHYROXINE SODIUM 50 MCG PO TABS   Oral   Take 1 tablet (50 mcg total) by mouth daily before breakfast. For hypothyroidism.           BP 114/66  Pulse 76  Temp 98.2 F (36.8 C) (Oral)  Resp 20  SpO2 95%  Physical Exam  Nursing note and vitals reviewed. Constitutional: She is oriented to person, place, and time. She appears well-developed and well-nourished. She appears distressed (appears tearful in bed,  with c-collar in place).  HENT:  Head: Normocephalic and atraumatic.       No hemotympanum, no scalp tenderness  Eyes: Conjunctivae normal and EOM are normal.  Neck: Neck supple.       c-collar in place.  No cervical midline tenderness, stepoff or crepitus  Cardiovascular: Normal rate.   Pulmonary/Chest: Effort normal and breath sounds normal. She exhibits no tenderness.  Abdominal: Soft. There is no tenderness.  Musculoskeletal: She exhibits tenderness (tenderness to L paralumbar region on palpation without overlying skin changes.  tenderness to lumbar midline without step off or crepitus.  Decreased ROM due to pain.  normal hip flexion). She exhibits no edema.  Neurological: She is alert and oriented to person, place, and time.  Skin: Skin is warm. No rash noted.  Psychiatric: She has a normal mood and affect.    ED Course  Procedures (including critical care time) Dg Lumbar Spine Complete  03/13/2012  *RADIOLOGY REPORT*  Clinical Data: Fall.  Back pain.  LUMBAR SPINE - COMPLETE 4+ VIEW  Comparison: 11/12/2011  Findings: Alignment  is normal.  There is chronic disc space narrowing at L5-S1.  There is mild facet degeneration at L5-S1.  No acute finding or progressive finding.  IMPRESSION: No acute or progressive finding.  Degenerative disc disease and degenerative facet disease at L5-S1.   Original Report Authenticated By: Paulina Fusi, M.D.      5:28 PM Pt was seen and evaluated for her reported fall and lower back injury.  Tenderness to L lower back without overlying skin changes.  Due to midline spine tenderness, will xray.  Pain medication given.  Will continue to monitor.    6:54 PM X-ray of L-spine shows no acute fractures or dislocation. There is degenerative disc disease noted. Result discussed with patient. Rice therapy discussed. We'll discharge with NSAIDs. Orthopedic referral as needed. Pt able to ambulate.  MDM  BP 114/66  Pulse 76  Temp 98.2 F (36.8 C) (Oral)  Resp 20  SpO2 95%  I have reviewed nursing notes and vital signs. I personally reviewed the imaging tests through PACS system  I reviewed available ER/hospitalization records thought the EMR         Fayrene Helper, [place] 03/13/12 1858

## 2012-03-13 NOTE — ED Notes (Signed)
AOZ:[id number]<VH> Expected date:<BR> Expected time:<BR> Means of arrival:<BR> Comments:<BR> 47 y/o M back pain/fall/LSB

## 2012-03-13 NOTE — ED Provider Notes (Signed)
Medical screening examination/treatment/procedure(s) were performed by non-physician practitioner and as supervising physician I was immediately available for consultation/collaboration.  Raeford Razor, MD 03/13/12 [phone]

## 2012-03-13 NOTE — ED Notes (Signed)
Per EMS, Pt, from home, slipped and fell on back porch.  C/o Left lower back pain.  Pain score 10/10.  Denies LOC.  Denies numbness and tingling.  Vitals are stable.

## 2012-03-13 NOTE — ED Notes (Signed)
Patient transported to X-ray 

## 2012-03-13 NOTE — ED Notes (Signed)
Pt c/o lower left back pain and numbness in feet.  Pt sts "I slipped and fell on my back porch.  My lower back hit the edge of the wooden steps."  Pt sts that she ambulated back into the house "very slowly."  Pulses noted in feet and cap refill< 3sec.  Pain score 10/10.  C-collar on and aligned.

## 2012-03-17 ENCOUNTER — Encounter (HOSPITAL_COMMUNITY): Payer: Self-pay | Admitting: *Deleted

## 2012-03-17 ENCOUNTER — Emergency Department (HOSPITAL_COMMUNITY)
Admission: EM | Admit: 2012-03-17 | Discharge: 2012-03-19 | Disposition: A | Discharge status: Home / Self Care | Payer: Medicaid Other | Attending: Emergency Medicine | Admitting: Emergency Medicine

## 2012-03-17 DIAGNOSIS — Z8739 Personal history of other diseases of the musculoskeletal system and connective tissue: Secondary | ICD-10-CM | POA: Insufficient documentation

## 2012-03-17 DIAGNOSIS — Z3202 Encounter for pregnancy test, result negative: Secondary | ICD-10-CM | POA: Insufficient documentation

## 2012-03-17 DIAGNOSIS — E669 Obesity, unspecified: Secondary | ICD-10-CM | POA: Insufficient documentation

## 2012-03-17 DIAGNOSIS — IMO0002 Reserved for concepts with insufficient information to code with codable children: Secondary | ICD-10-CM | POA: Insufficient documentation

## 2012-03-17 DIAGNOSIS — Z79899 Other long term (current) drug therapy: Secondary | ICD-10-CM | POA: Insufficient documentation

## 2012-03-17 DIAGNOSIS — F411 Generalized anxiety disorder: Secondary | ICD-10-CM | POA: Insufficient documentation

## 2012-03-17 DIAGNOSIS — X500XXA Overexertion from strenuous movement or load, initial encounter: Secondary | ICD-10-CM | POA: Insufficient documentation

## 2012-03-17 DIAGNOSIS — Y929 Unspecified place or not applicable: Secondary | ICD-10-CM | POA: Insufficient documentation

## 2012-03-17 DIAGNOSIS — F603 Borderline personality disorder: Secondary | ICD-10-CM | POA: Insufficient documentation

## 2012-03-17 DIAGNOSIS — Y939 Activity, unspecified: Secondary | ICD-10-CM | POA: Insufficient documentation

## 2012-03-17 DIAGNOSIS — F319 Bipolar disorder, unspecified: Secondary | ICD-10-CM | POA: Insufficient documentation

## 2012-03-17 DIAGNOSIS — Z8709 Personal history of other diseases of the respiratory system: Secondary | ICD-10-CM | POA: Insufficient documentation

## 2012-03-17 DIAGNOSIS — F419 Anxiety disorder, unspecified: Secondary | ICD-10-CM

## 2012-03-17 DIAGNOSIS — I10 Essential (primary) hypertension: Secondary | ICD-10-CM | POA: Insufficient documentation

## 2012-03-17 LAB — ACETAMINOPHEN LEVEL: Acetaminophen (Tylenol), Serum: <15 ug/mL (ref 10–30)

## 2012-03-17 LAB — BASIC METABOLIC PANEL
Calcium: 8.9 mg/dL (ref 8.4–10.5)
Creatinine, Ser: 0.59 mg/dL (ref 0.50–1.10)
GFR calc non Af Amer: >90 mL/min (ref >90)
Glucose, Bld: 86 mg/dL (ref 70–99)
Sodium: 134 mEq/L — ABNORMAL LOW (ref 135–145)

## 2012-03-17 LAB — RAPID URINE DRUG SCREEN, HOSP PERFORMED
Barbiturates: POSITIVE — AB
Cocaine: NOT DETECTED

## 2012-03-17 LAB — CBC
MCH: 32.5 pg (ref 26.0–34.0)
MCHC: 34 g/dL (ref 30.0–36.0)
Platelets: 282 10*3/uL (ref 150–400)

## 2012-03-17 LAB — PREGNANCY, URINE: Preg Test, Ur: NEGATIVE

## 2012-03-17 MED ORDER — IBUPROFEN 600 MG PO TABS
600.0000 mg | ORAL_TABLET | Freq: Three times a day (TID) | ORAL | Status: DC | PRN
  Administered 2012-03-17 – 2012-03-19 (×5): 600 mg via ORAL
  Filled 2012-03-17 (×5): qty 1

## 2012-03-17 MED ORDER — ONDANSETRON HCL 4 MG PO TABS: 4.0000 mg | ORAL_TABLET | Freq: Three times a day (TID) | ORAL | Status: DC | PRN

## 2012-03-17 MED ORDER — CARISOPRODOL 350 MG PO TABS
350.0000 mg | ORAL_TABLET | Freq: Three times a day (TID) | ORAL | Status: DC | PRN
  Administered 2012-03-18 – 2012-03-19 (×5): 350 mg via ORAL
  Filled 2012-03-17 (×5): qty 1

## 2012-03-17 MED ORDER — LAMOTRIGINE 150 MG PO TABS
150.0000 mg | ORAL_TABLET | Freq: Two times a day (BID) | ORAL | Status: DC
  Administered 2012-03-18 – 2012-03-19 (×3): 150 mg via ORAL
  Filled 2012-03-17 (×5): qty 1

## 2012-03-17 MED ORDER — ALUM & MAG HYDROXIDE-SIMETH 200-200-20 MG/5ML PO SUSP: 30.0000 mL | ORAL | Status: DC | PRN

## 2012-03-17 MED ORDER — NICOTINE 21 MG/24HR TD PT24: 21.0000 mg | MEDICATED_PATCH | Freq: Every day | TRANSDERMAL | Status: DC

## 2012-03-17 MED ORDER — LORAZEPAM 1 MG PO TABS
1.0000 mg | ORAL_TABLET | Freq: Three times a day (TID) | ORAL | Status: DC | PRN
  Administered 2012-03-17 – 2012-03-19 (×5): 1 mg via ORAL
  Filled 2012-03-17 (×6): qty 1

## 2012-03-17 MED ORDER — ALPRAZOLAM 1 MG PO TABS
2.0000 mg | ORAL_TABLET | Freq: Three times a day (TID) | ORAL | Status: DC | PRN
Start: 2012-03-17 — End: 2012-03-20
  Administered 2012-03-18 – 2012-03-19 (×2): 2 mg via ORAL
  Filled 2012-03-17 (×2): qty 2

## 2012-03-17 MED ORDER — ZIPRASIDONE MESYLATE 20 MG IM SOLR
10.0000 mg | INTRAMUSCULAR | Status: DC | PRN
Start: 2012-03-17 — End: 2012-03-20
  Administered 2012-03-17: 10 mg via INTRAMUSCULAR
  Filled 2012-03-17: qty 20

## 2012-03-17 MED ORDER — DIPHENHYDRAMINE HCL 50 MG/ML IJ SOLN: 50.0000 mg | Freq: Four times a day (QID) | INTRAMUSCULAR | Status: DC | PRN

## 2012-03-17 MED ORDER — GABAPENTIN 400 MG PO CAPS
400.0000 mg | ORAL_CAPSULE | Freq: Three times a day (TID) | ORAL | Status: DC
  Administered 2012-03-17 – 2012-03-19 (×6): 400 mg via ORAL
  Filled 2012-03-17 (×7): qty 1

## 2012-03-17 MED ORDER — ZOLPIDEM TARTRATE 5 MG PO TABS
5.0000 mg | ORAL_TABLET | Freq: Every evening | ORAL | Status: DC | PRN
  Administered 2012-03-17 – 2012-03-18 (×2): 5 mg via ORAL
  Filled 2012-03-17 (×2): qty 1

## 2012-03-17 MED ORDER — DULOXETINE HCL 60 MG PO CPEP
60.0000 mg | ORAL_CAPSULE | Freq: Every day | ORAL | Status: DC
  Administered 2012-03-17 – 2012-03-19 (×3): 60 mg via ORAL
  Filled 2012-03-17 (×3): qty 1

## 2012-03-17 MED ORDER — HALOPERIDOL LACTATE 5 MG/ML IJ SOLN: 5.0000 mg | Freq: Four times a day (QID) | INTRAMUSCULAR | Status: DC | PRN

## 2012-03-17 MED ORDER — LEVOTHYROXINE SODIUM 50 MCG PO TABS
50.0000 ug | ORAL_TABLET | Freq: Every day | ORAL | Status: DC
  Administered 2012-03-18 – 2012-03-19 (×2): 50 ug via ORAL
  Filled 2012-03-17 (×3): qty 1

## 2012-03-17 MED ORDER — BUSPIRONE HCL 10 MG PO TABS
30.0000 mg | ORAL_TABLET | Freq: Two times a day (BID) | ORAL | Status: DC
  Administered 2012-03-17 – 2012-03-19 (×4): 30 mg via ORAL
  Filled 2012-03-17 (×4): qty 3

## 2012-03-17 NOTE — ED Provider Notes (Signed)
Telepsych evaluation was reviewed. Patient initiated on the recommended medications. Continue to pursue inpatient psychiatric treatment.  Gilda Crease, MD 03/17/12 2300

## 2012-03-17 NOTE — ED Notes (Addendum)
Patient arrived to unit, angry and cursing staff, demanding pain medications and soda. Patient difficult to be redirected, RN having trouble getting a word in; pt hyperverbal. Pt continues to scream and called RN "you fucking bitch!" Pt not willing to follow rules and is upsetting milieu. Security called and Dr. Blinda Leatherwood called; waiting for orders.

## 2012-03-17 NOTE — ED Provider Notes (Signed)
Medical screening examination/treatment/procedure(s) were performed by non-physician practitioner and as supervising physician I was immediately available for consultation/collaboration.  Ebonie Westerlund R. Abigial Newville, MD 03/17/12 2345 
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
## 2012-03-17 NOTE — ED Notes (Signed)
Telepsych request faxed and called in to service. 

## 2012-03-17 NOTE — ED Notes (Signed)
telepsych completed 

## 2012-03-17 NOTE — ED Provider Notes (Signed)
History   This chart was scribed for non-physician practitioner working with Juliet Rude. Rubin Payor, MD by Leone Payor, ED Scribe. This patient was seen in room WTR4/WLPT4 and the patient's care was started at 1753.   CSN: [id number]  Arrival date & time 03/17/12  1753   First MD Initiated Contact with Patient 03/17/12 1801      No chief complaint on file.    The history is provided by the patient. No language interpreter was used.   Level 5 Caveat- Psych Disorder Brittany [name] is a 47 y.o. female brought in by ambulance, who presents to the Emergency Department requesting detox to get off drugs today. Pt has been kicked out of a group home, has lost meds. She fell 3 days ago on a step and has bruising to lower left back. Pt has SI. She states "wanting to take her wrist and slam it on a fence". She denies HI.    Pt has h/o HTN, bipolar 1 disorder, anxiety, back pain, bulging disc, ruptured intervertebral disc, migraine.  Pt denies smoking and alcohol use.  No PCP.  Past Medical History  Diagnosis Date  . Hypertension   . Bipolar 1 disorder   . Anxiety   . Back pain   . Bulging disc   . Ruptured intervertebral disc   . Migraine     Past Surgical History  Procedure Date  . Abdominal hysterectomy     No family history on file.  History  Substance Use Topics  . Smoking status: Never Smoker   . Smokeless tobacco: Not on file  . Alcohol Use: No    No OB history provided.   Review of Systems  Level 5 Caveat- Psych Disorder   Allergies  Toradol  Home Medications   Current Outpatient Rx  Name  Route  Sig  Dispense  Refill  . BUSPIRONE HCL 30 MG PO TABS   Oral   Take 1 tablet (30 mg total) by mouth 2 (two) times daily with breakfast and lunch. For anxiety and panic attacks.   60 tablet   0   . CARISOPRODOL 350 MG PO TABS   Oral   Take 350 mg by mouth 3 (three) times daily as needed. For pain.         . DULOXETINE HCL 60 MG PO CPEP   Oral   Take 1 capsule  (60 mg total) by mouth daily. For anxiety and depression.   30 capsule   0   . GABAPENTIN 400 MG PO CAPS   Oral   Take 1 capsule (400 mg total) by mouth 3 (three) times daily. For anxiety.   90 capsule   0   . IBUPROFEN 600 MG PO TABS   Oral   Take 1 tablet (600 mg total) by mouth every 6 (six) hours as needed for pain.   30 tablet   0   . LAMOTRIGINE 150 MG PO TABS   Oral   Take 1 tablet (150 mg total) by mouth 2 (two) times daily. For mood stabilization.   60 tablet   0   . LEVOTHYROXINE SODIUM 50 MCG PO TABS   Oral   Take 1 tablet (50 mcg total) by mouth daily before breakfast. For hypothyroidism.           BP 134/84  Pulse 82  Temp 98.7 F (37.1 C) (Oral)  Resp 20  SpO2 99%  Physical Exam  Nursing note and vitals reviewed. Constitutional: She is oriented to  person, place, and time. She appears well-developed. No distress.       Obese  HENT:  Head: Normocephalic and atraumatic.  Mouth/Throat: Oropharynx is clear and moist.  Eyes: Conjunctivae normal and EOM are normal. Pupils are equal, round, and reactive to light. No scleral icterus.       Pupils dilated 5 mm b/l  Neck: Neck supple. No tracheal deviation present.  Cardiovascular: Normal rate, regular rhythm and normal heart sounds.   Pulmonary/Chest: Effort normal and breath sounds normal. No respiratory distress. She has no wheezes. She has no rales.  Musculoskeletal: She exhibits no edema.       Generalized tenderness throughout entire lumbar spine and paralumbar muscles. Decreased ROM due to pain. No overlying bruising or ecchymosis.  Neurological: She is alert and oriented to person, place, and time.  Skin: Skin is warm and dry.  Psychiatric: Her speech is rapid and/or pressured and tangential. She is is hyperactive. She expresses impulsivity. She exhibits a depressed mood. She expresses suicidal ideation. She expresses no homicidal ideation. She expresses suicidal plans. She expresses no homicidal plans.     ED Course  Procedures (including critical care time)  DIAGNOSTIC STUDIES: Oxygen Saturation is 99% on room air, normal by my interpretation.    COORDINATION OF CARE:  6:17 PM Ordered lab work and and Printmaker.    Labs Reviewed - No data to display No results found.   No diagnosis found.    MDM  47 y/o female requesting detox from medications. Initially told triage she would like her back checked, and each time a new person entered the room a different story was given. ACT team consulted who will evaluate patient. Telepsych ordered. Patient will be moved to psych ED.   10:39 PM Patient medically cleared.   I personally performed the services described in this documentation, which was scribed in my presence. The recorded information has been reviewed and is accurate.   Trevor Mace, PA-C 03/17/12 2239

## 2012-03-17 NOTE — ED Notes (Signed)
Patient given Geodon as ordered by MD. Pt willingly took injection. No s/s of distress noted.
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
## 2012-03-18 LAB — SALICYLATE LEVEL: Salicylate Lvl: <2 mg/dL — ABNORMAL LOW (ref 2.8–20.0)

## 2012-03-18 NOTE — B[facility] Counselor (Signed)
2/3 Pt referred to [facility]; pending review.   2/3 Pt referred to [facility], [facility], [facility], [facility], [facility], [facility], & [facility]; pending review.   2/3 No beds at [facility], [facility], [facility], [facility], [facility], [facility], and [facility]. 

## 2012-03-18 NOTE — ED Provider Notes (Signed)
Patient is resting this morning without complaint. Patient had telepsych done yesterday that recommended inpatient treatment but also possible malingering. However patient is currently not receiving any pain medication and is still endorsing SI. Will have rounding psychiatrist to evaluate patient this morning. Patient is pending review at [facility].  Evaluation done by telepsych and did not recommend any meds  Gwyneth Sprout, MD 03/18/12 [phone]

## 2012-03-18 NOTE — ED Notes (Signed)
Patient asleep; no s/s of distress noted. 

## 2012-03-18 NOTE — Progress Notes (Signed)
CSW received referral from day csw that patient was requesting to speak with CSW. When csw attempted to assess patient, patient was soundly sleeping per RN who had just visited patients room. CSW to follow up at later time.   Catha Gosselin, LCSWA  [phone] .03/18/2012 7:45pm
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
## 2012-03-18 NOTE — BH Assessment (Signed)
Assessment Note   Brittany [name] is an 47 y.o. female. Pt presents voluntarily to [facility] via [employer]. Pt states she has severe back pain from a fall. She endorses SI and says she has a plan. "I want to hurt myself and put my wrist down a fence". She reports 10 prior attempts. Pt irritable. She speaks loudly and asks "What are you going to do to help me?". Pt says she fell on a concrete step "a few nights ago" and experiences "severe, severe back trauma and pain from falling in the snow".  She endorses depressed and anxious mood with insomnia, tearfulness, isolating, fatigue, loss of interest, and anger/irritability. She reports having a panic disorder. Pt denies HI. Pt says she is compliant with her psych meds except for today as she has been in the ED. Current stressor is death of her father in early 01/18/2023. Pt was at [facility] from 01/13/12 to 01/18/12. She presently lives in a boarding house. Pt denies [facility] and no delusions noted. Dr. Adela Glimpse telepsych consult recommends inpatient. Telepsych consult recommends ruling out Malingering.  Axis I: 296.80 Bipolar Disorder NOS Axis II: Cluster B Traits Axis III:  Past Medical History  Diagnosis Date  . Hypertension   . Bipolar 1 disorder   . Anxiety   . Back pain   . Bulging disc   . Ruptured intervertebral disc   . Migraine    Axis IV: other psychosocial or environmental problems, problems related to social environment and problems with primary support group Axis V: 31-40 impairment in reality testing  Past Medical History:  Past Medical History  Diagnosis Date  . Hypertension   . Bipolar 1 disorder   . Anxiety   . Back pain   . Bulging disc   . Ruptured intervertebral disc   . Migraine     Past Surgical History  Procedure Date  . Abdominal hysterectomy     Family History: No family history on file.  Social History:  reports that she has never smoked. She does not have any smokeless tobacco history on file. She reports that she does not  drink alcohol or use illicit drugs.  Additional Social History:  Alcohol / Drug Use Pain Medications: see PTA meds list Prescriptions: see PTA meds list Over the Counter: see PTA meds list  CIWA: CIWA-Ar BP: 119/75 mmHg Pulse Rate: 64  COWS:    Allergies:  Allergies  Allergen Reactions  . Toradol (Ketorolac Tromethamine) Swelling    Home Medications:  (Not in a hospital admission)  OB/GYN Status:  No LMP recorded. Patient has had a hysterectomy.  General Assessment Data Location of Assessment: [facility] Living Arrangements: Alone Can pt return to current living arrangement?: Yes Admission Status: Voluntary Is patient capable of signing voluntary admission?: Yes Transfer from: Acute Hospital Referral Source: Self/Family/Friend  Education Status Is patient currently in school?: No Current Grade: na Highest grade of school patient has completed: 13  Risk to self Suicidal Ideation: Yes-Currently Present Suicidal Intent: No Is patient at risk for suicide?: No Suicidal Plan?: Yes-Currently Present Specify Current Suicidal Plan: to hurt her wrist on a fence Access to Means: Yes Specify Access to Suicidal Means: access to fences What has been your use of drugs/alcohol within the last 12 months?: pt denies Previous Attempts/Gestures: Yes How many times?: 10  Other Self Harm Risks: none Triggers for Past Attempts: Other (Comment) (trying to get her parents' attention) Intentional Self Injurious Behavior: Cutting Comment - Self Injurious Behavior: not currently, in  the past Family Suicide History: No Recent stressful life event(s): Loss (Comment) (father died 2011/12/28) Persecutory voices/beliefs?: No Depression: Yes Depression Symptoms: Insomnia;Tearfulness;Isolating;Fatigue;Loss of interest in usual pleasures;Feeling angry/irritable Substance abuse history and/or treatment for substance abuse?: Yes Suicide prevention information given to non-admitted patients: Not  applicable  Risk to Others Homicidal Ideation: No Thoughts of Harm to Others: No Current Homicidal Intent: No Current Homicidal Plan: No Access to Homicidal Means: No Identified Victim: none History of harm to others?: No Assessment of Violence: None Noted Violent Behavior Description: pt denies hx violence Does patient have access to weapons?: No Criminal Charges Pending?: No Does patient have a court date: No  Psychosis Hallucinations: None noted Delusions: None noted  Mental Status Report Appear/Hygiene: Disheveled Eye Contact: Fair Motor Activity: Freedom of movement;Gestures Speech: Logical/coherent;Loud Level of Consciousness: Alert;Irritable Mood: Depressed;Anxious Affect: Irritable Anxiety Level: Severe Thought Processes: Relevant;Coherent Judgement: Impaired Orientation: Person;Place;Situation;Time Obsessive Compulsive Thoughts/Behaviors: None  Cognitive Functioning Concentration: Normal Memory: Recent Intact;Remote Intact IQ: Average Insight: Poor Impulse Control: Poor Appetite: Good Weight Loss: 0  Weight Gain: 0  Sleep: Decreased Total Hours of Sleep: 3  Vegetative Symptoms: None  ADLScreening [facility] Assessment Services) Patient's cognitive ability adequate to safely complete daily activities?: Yes Patient able to express need for assistance with ADLs?: Yes Independently performs ADLs?: Yes (appropriate for developmental age)  Abuse/Neglect [facility]) Physical Abuse: Denies Verbal Abuse: Denies Sexual Abuse: Denies  Prior Inpatient Therapy Prior Inpatient Therapy: Yes Prior Therapy Dates: [facility] 01/13/12-01/18/12 (multiple prior admits to [facility] in 2012) Prior Therapy Facilty/Provider(s): pt reports multiple admits to [facility] in past Reason for Treatment: bipolar, SI  Prior Outpatient Therapy Prior Outpatient Therapy: Yes Prior Therapy Dates: 2013 Prior Therapy Facilty/Provider(s): [facility] Reason for Treatment:  psych  ADL Screening (condition at time of admission) Patient's cognitive ability adequate to safely complete daily activities?: Yes Patient able to express need for assistance with ADLs?: Yes Independently performs ADLs?: Yes (appropriate for developmental age) Weakness of Legs: None Weakness of Arms/Hands: None  Home Assistive Devices/Equipment Home Assistive Devices/Equipment: None    Abuse/Neglect Assessment (Assessment to be complete while patient is alone) Physical Abuse: Denies Verbal Abuse: Denies Sexual Abuse: Denies Exploitation of patient/patient's resources: Denies Self-Neglect: Denies Values / Beliefs Cultural Requests During Hospitalization: None Spiritual Requests During Hospitalization: None   Advance Directives (For Healthcare) Advance Directive: Patient does not have advance directive    Additional Information 1:1 In Past 12 Months?: No CIRT Risk: No Elopement Risk: No Does patient have medical clearance?: Yes     Disposition:  Disposition Disposition of Patient: Inpatient treatment program ([facility] telepsych recommends inpatient) Type of inpatient treatment program: Adult  On Site Evaluation by:   Reviewed with Physician:     Donnamarie Rossetti P 03/18/2012 1:39 AM
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
## 2012-03-19 DIAGNOSIS — F319 Bipolar disorder, unspecified: Secondary | ICD-10-CM

## 2012-03-19 DIAGNOSIS — F603 Borderline personality disorder: Secondary | ICD-10-CM

## 2012-03-19 MED ORDER — WHITE PETROLATUM GEL
Status: AC
  Administered 2012-03-19: 17:00:00
  Filled 2012-03-19: qty 5

## 2012-03-19 MED ORDER — ACETAMINOPHEN 325 MG PO TABS: 650.0000 mg | ORAL_TABLET | ORAL | Status: DC | PRN

## 2012-03-19 NOTE — ED Provider Notes (Signed)
Brittany [name] is a 47 y.o. female who is being evaluated for suicidal ideation. She has been seen by the tele-psychiatrist, who recommends inpatient psychiatric admission. The patient's main concern is that she have her medications adjusted. She states that she was staying at a residential home, and had her Fioricet, Xanax, soma, and Tylenol, taken from her. She complains of low back pain. That is chronic. She wants something done about the pain. She states that today, she is feeling sad. She does not express suicidal ideation. She is not currently have a therapist.  Will proceed with placement with the assistance of assessment crisis team. The patient has been declined at the behavioral health hospital due to prior behavioral issues, there. Her pain will be treated with Tylenol.  Flint Melter, MD 03/19/12 [phone]

## 2012-03-19 NOTE — BHH Suicide Risk Assessment (Signed)
Suicide Risk Assessment  Discharge Assessment     Demographic Factors:  Adolescent or young adult, Caucasian, Low socioeconomic status and Living alone  Mental Status Per Nursing Assessment::   On Admission:     Current Mental Status by Physician: Self-harm behaviors  Loss Factors: Financial problems/change in socioeconomic status  Historical Factors: Impulsivity  Risk Reduction Factors:   Religious beliefs about death, Living with another person, especially a relative and Positive therapeutic relationship  Continued Clinical Symptoms:  Bipolar Disorder:   Depressive phase Personality Disorders:   Cluster B Chronic Pain Previous Psychiatric Diagnoses and Treatments Medical Diagnoses and Treatments/Surgeries  Cognitive Features That Contribute To Risk:  Polarized thinking    Suicide Risk:  Minimal: No identifiable suicidal ideation.  Patients presenting with no risk factors but with morbid ruminations; may be classified as minimal risk based on the severity of the depressive symptoms  Discharge Diagnoses:   AXIS I:  Bipolar, Depressed AXIS II:  Borderline Personality Dis. AXIS III:   Past Medical History  Diagnosis Date  . Hypertension   . Bipolar 1 disorder   . Anxiety   . Back pain   . Bulging disc   . Ruptured intervertebral disc   . Migraine    AXIS IV:  economic problems, housing problems, other psychosocial or environmental problems and problems related to social environment AXIS V:  41-50 serious symptoms  Plan Of Care/Follow-up recommendations:  Activity:  As tolerated Diet:  Regular  Is patient on multiple antipsychotic therapies at discharge:  No   Has Patient had three or more failed trials of antipsychotic monotherapy by history:  No  Recommended Plan for Multiple Antipsychotic Therapies: Not applicable  Linn Clavin,JANARDHAHA R. 03/19/2012, 5:33 PM

## 2012-03-19 NOTE — BHH Counselor (Signed)
Patient to be discharged home per psychiatrist- Dr.Jonnalagadda. Patient given outpatient referrals to the local mental health-[facility] for med management.. Patient also given additional referrals to local therapist and mobile crises.

## 2012-03-19 NOTE — ED Notes (Signed)
Patient discharge to home with written and verbal instructions with a steady gait. NAD.

## 2012-03-19 NOTE — Clinical Social Work Note (Signed)
CSW met with pt at bedside.  Pt thoughts are tangential.  Pt requesting her probation officer's phone number.  Pt reports her probation officer as Earney Mallet in [place].  CSW called [facility] at [phone] and was given [phone] for Probation.  CSW called and asked to speak to [facility].  [name] is on vacation and the secretary suggested the pt speak to her chief if she needs to speak with someone before [name] returns from vacation.  CSW advised pt.  Pt also requesting CSW to contact MD who prescribed Rx and request MD to discontinue the meds.  Pt reported to CSW MD, [place] and his # [phone].  CSW attempted to call MD as pt requested and received no answer.    CSW made pt aware that these phone calls would need to be made through the RN station.  CSW signing off.  Vickii Penna, LCSWA [phone]  Clinical Social Work
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
## 2012-03-26 ENCOUNTER — Other Ambulatory Visit: Payer: Self-pay | Admitting: Internal Medicine

## 2012-03-26 DIAGNOSIS — Z1231 Encounter for screening mammogram for malignant neoplasm of breast: Secondary | ICD-10-CM

## 2012-04-08 ENCOUNTER — Emergency Department (HOSPITAL_COMMUNITY)
Admission: EM | Admit: 2012-04-08 | Discharge: 2012-04-08 | Disposition: A | Discharge status: Other Acute Inpatient Hospital | Payer: Medicaid Other | Attending: Emergency Medicine | Admitting: Emergency Medicine

## 2012-04-08 ENCOUNTER — Encounter (HOSPITAL_COMMUNITY): Payer: Self-pay

## 2012-04-08 DIAGNOSIS — M549 Dorsalgia, unspecified: Secondary | ICD-10-CM | POA: Insufficient documentation

## 2012-04-08 DIAGNOSIS — F329 Major depressive disorder, single episode, unspecified: Secondary | ICD-10-CM

## 2012-04-08 DIAGNOSIS — I10 Essential (primary) hypertension: Secondary | ICD-10-CM | POA: Insufficient documentation

## 2012-04-08 DIAGNOSIS — F319 Bipolar disorder, unspecified: Secondary | ICD-10-CM | POA: Insufficient documentation

## 2012-04-08 DIAGNOSIS — F411 Generalized anxiety disorder: Secondary | ICD-10-CM | POA: Insufficient documentation

## 2012-04-08 DIAGNOSIS — R51 Headache: Secondary | ICD-10-CM | POA: Insufficient documentation

## 2012-04-08 DIAGNOSIS — Z79899 Other long term (current) drug therapy: Secondary | ICD-10-CM | POA: Insufficient documentation

## 2012-04-08 DIAGNOSIS — R45851 Suicidal ideations: Secondary | ICD-10-CM | POA: Insufficient documentation

## 2012-04-08 DIAGNOSIS — G8929 Other chronic pain: Secondary | ICD-10-CM | POA: Insufficient documentation

## 2012-04-08 LAB — COMPREHENSIVE METABOLIC PANEL
Albumin: 4.3 g/dL (ref 3.5–5.2)
Alkaline Phosphatase: 63 U/L (ref 39–117)
BUN: 8 mg/dL (ref 6–23)
CO2: 24 mEq/L (ref 19–32)
Chloride: 100 mEq/L (ref 96–112)
GFR calc Af Amer: >90 mL/min (ref >90)
Glucose, Bld: 86 mg/dL (ref 70–99)
Potassium: 3.9 mEq/L (ref 3.5–5.1)
Total Bilirubin: 0.4 mg/dL (ref 0.3–1.2)

## 2012-04-08 LAB — CBC WITH DIFFERENTIAL/PLATELET
Hemoglobin: 13.3 g/dL (ref 12.0–15.0)
Lymphocytes Relative: 28 % (ref 12–46)
Lymphs Abs: 1.9 10*3/uL (ref 0.7–4.0)
Monocytes Relative: 7 % (ref 3–12)
Neutro Abs: 4.2 10*3/uL (ref 1.7–7.7)
Neutrophils Relative %: 62 % (ref 43–77)
RBC: 4.12 MIL/uL (ref 3.87–5.11)

## 2012-04-08 LAB — RAPID URINE DRUG SCREEN, HOSP PERFORMED
Opiates: POSITIVE — AB
Tetrahydrocannabinol: NOT DETECTED

## 2012-04-08 MED ORDER — DULOXETINE HCL 60 MG PO CPEP: 60.0000 mg | ORAL_CAPSULE | Freq: Every day | ORAL | Status: DC

## 2012-04-08 MED ORDER — BUTALBITAL-APAP-CAFFEINE 50-325-40 MG PO TABS
1.0000 | ORAL_TABLET | Freq: Once | ORAL | Status: AC
  Administered 2012-04-08: 1 via ORAL
  Filled 2012-04-08: qty 1

## 2012-04-08 MED ORDER — LAMOTRIGINE 150 MG PO TABS
150.0000 mg | ORAL_TABLET | Freq: Two times a day (BID) | ORAL | Status: DC
  Administered 2012-04-08 (×2): 150 mg via ORAL
  Filled 2012-04-08 (×2): qty 1

## 2012-04-08 MED ORDER — GABAPENTIN 800 MG PO TABS: 800.0000 mg | ORAL_TABLET | Freq: Three times a day (TID) | ORAL | Status: DC

## 2012-04-08 MED ORDER — BUSPIRONE HCL 10 MG PO TABS
30.0000 mg | ORAL_TABLET | Freq: Two times a day (BID) | ORAL | Status: DC
Start: 2012-04-08 — End: 2012-04-09
  Administered 2012-04-08 (×2): 30 mg via ORAL
  Filled 2012-04-08 (×2): qty 3

## 2012-04-08 MED ORDER — LEVOTHYROXINE SODIUM 50 MCG PO TABS
50.0000 ug | ORAL_TABLET | Freq: Every day | ORAL | Status: DC
  Filled 2012-04-08: qty 1

## 2012-04-08 MED ORDER — CARISOPRODOL 350 MG PO TABS
350.0000 mg | ORAL_TABLET | Freq: Three times a day (TID) | ORAL | Status: DC | PRN
  Administered 2012-04-08 (×2): 350 mg via ORAL
  Filled 2012-04-08 (×2): qty 1

## 2012-04-08 MED ORDER — ALPRAZOLAM 1 MG PO TABS
2.0000 mg | ORAL_TABLET | Freq: Three times a day (TID) | ORAL | Status: DC | PRN
Start: 2012-04-08 — End: 2012-04-09
  Administered 2012-04-08 (×2): 2 mg via ORAL
  Filled 2012-04-08 (×2): qty 1
  Filled 2012-04-08: qty 2

## 2012-04-08 MED ORDER — GABAPENTIN 400 MG PO CAPS
800.0000 mg | ORAL_CAPSULE | Freq: Three times a day (TID) | ORAL | Status: DC
  Administered 2012-04-08 (×2): 800 mg via ORAL
  Filled 2012-04-08 (×3): qty 2
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
## 2012-04-08 NOTE — ED Provider Notes (Signed)
History     CSN: [id number]  Arrival date & time 04/08/12  1301   First MD Initiated Contact with Patient 04/08/12 1407      Chief Complaint  Patient presents with  . Medical Clearance    (Consider location/radiation/quality/duration/timing/severity/associated sxs/prior treatment) HPI This 47 year old female has chronic pain chronic headaches chronic back pain chronic depression chronic anxiety states she is depressed worse than usual with suicidal ideation without a plan, she denies any threats or others she is not homicidal she is not hallucinating. She like to be admitted for a few days and she would like the emergency doctors to prescribe narcotic pain medicines for her chronic pain syndromes although she is informed emergency physicians do not routinely manage chronic pain. Past Medical History  Diagnosis Date  . Hypertension   . Bipolar 1 disorder   . Anxiety   . Back pain   . Bulging disc   . Ruptured intervertebral disc   . Migraine     Past Surgical History  Procedure Laterality Date  . Abdominal hysterectomy      Family History  Problem Relation Age of Onset  . Migraines Mother   . Migraines Father   . Migraines Sister     History  Substance Use Topics  . Smoking status: Never Smoker   . Smokeless tobacco: Never Used  . Alcohol Use: No    OB History   Grav Para Term Preterm Abortions TAB SAB Ect Mult Living                  Review of Systems 10 Systems reviewed and are negative for acute change except as noted in the HPI. Allergies  Toradol  Home Medications   Current Outpatient Rx  Name  Route  Sig  Dispense  Refill  . alprazolam (XANAX) 2 MG tablet   Oral   Take 2 mg by mouth 3 (three) times daily as needed. Anxiety         . busPIRone (BUSPAR) 30 MG tablet   Oral   Take 30 mg by mouth 2 (two) times daily.         . butalbital-acetaminophen-caffeine (FIORICET, ESGIC) 50-325-40 MG per tablet   Oral   Take 1 tablet by mouth 3  (three) times daily as needed for headache.          . carisoprodol (SOMA) 350 MG tablet   Oral   Take 350 mg by mouth 3 (three) times daily as needed. For pain.         . DULoxetine (CYMBALTA) 60 MG capsule   Oral   Take 60 mg by mouth daily.         Marland Kitchen gabapentin (NEURONTIN) 800 MG tablet   Oral   Take 800 mg by mouth 4 (four) times daily - after meals and at bedtime.         . lamoTRIgine (LAMICTAL) 150 MG tablet   Oral   Take 1 tablet (150 mg total) by mouth 2 (two) times daily. For mood stabilization.   60 tablet   0   . levothyroxine (SYNTHROID, LEVOTHROID) 50 MCG tablet   Oral   Take 50 mcg by mouth daily before breakfast.          . phentermine (ADIPEX-P) 37.5 MG tablet   Oral   Take 37.5 mg by mouth daily before breakfast.         . zolpidem (AMBIEN) 10 MG tablet   Oral   Take 10  mg by mouth at bedtime as needed for sleep.           BP 121/81  Pulse 80  Temp(Src) 98.7 F (37.1 C) (Oral)  Resp 18  SpO2 96%  Physical Exam  Nursing note and vitals reviewed. Constitutional:  Awake, alert, nontoxic appearance with baseline speech for patient.  HENT:  Head: Atraumatic.  Mouth/Throat: No oropharyngeal exudate.  Eyes: EOM are normal. Pupils are equal, round, and reactive to light. Right eye exhibits no discharge. Left eye exhibits no discharge.  Neck: Neck supple.  Cardiovascular: Normal rate and regular rhythm.   No murmur heard. Pulmonary/Chest: Effort normal and breath sounds normal. No stridor. No respiratory distress. She has no wheezes. She has no rales. She exhibits no tenderness.  Abdominal: Soft. Bowel sounds are normal. She exhibits no mass. There is no tenderness. There is no rebound.  Musculoskeletal: She exhibits tenderness.  Baseline ROM, moves extremities with no obvious new focal weakness. Diffuse back tenderness.  Lymphadenopathy:    She has no cervical adenopathy.  Neurological: She is alert.  Awake, alert, cooperative and  aware of situation; motor strength bilaterally; sensation normal to light touch bilaterally; peripheral visual fields full to confrontation; no facial asymmetry; tongue midline; major cranial nerves appear intact; no pronator drift, normal finger to nose bilaterally  Skin: No rash noted.  Psychiatric:  Anxious and depressed state she is vague suicidal ideation without a plan, she denies any homicidal ideation or hallucinations    ED Course  Procedures (including critical care time)  Labs Reviewed  URINE RAPID DRUG SCREEN (HOSP PERFORMED) - Abnormal; Notable for the following:    Opiates POSITIVE (*)    All other components within normal limits  CBC WITH DIFFERENTIAL  COMPREHENSIVE METABOLIC PANEL  ETHANOL   No results found.   1. Suicidal ideation   2. Chronic pain   3. Anxiety and depression   4. Chronic headaches   5. Chronic back pain       MDM  Patient / Family / Caregiver understand and agree with initial ED impression and plan with expectations set for ED visit.        Hurman Horn, MD 04/15/12 [phone]

## 2012-04-08 NOTE — ED Notes (Signed)
Pt asleep; no s/s of distress noted at this time. 

## 2012-04-08 NOTE — ED Notes (Signed)
Pt c/o anxiety, pain and insomnia stating "Just give me everything I can have now". No acute distress noted.

## 2012-04-08 NOTE — ED Notes (Signed)
Patient reports that she is feeling hopelessness and feeling suicidal. Patient denies having a plan to hurt herself.  Patient denies HI and hallucinations.

## 2012-04-08 NOTE — Treatment Plan (Signed)
Declined at [facility] by Donell Sievert, PA due to pt's own admission during her telepsych that "see feels safe if she can stay in the hospital for 2 days."  The MD in the psych ED can easily restart her medications and discharge her within the 2 days pt needs in order to feel safe.
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
## 2012-04-08 NOTE — ED Provider Notes (Signed)
6:31 PM received telepsych report- pt continues to express suicidality- recommend inpatient admission.  Also rec to increase cymbalta to 60mg  po qAM and continue same psych meds for now.    Ethelda Chick, MD 04/08/12 [phone]

## 2012-04-08 NOTE — Progress Notes (Addendum)
Pt referred to [facility], Declined at [facility] by Donell Sievert, PA due to pt's own admission during her telepsych that "see feels safe if she can stay in the hospital for 2 days." The MD in the psych ED can easily restart her medications and discharge her within the 2 days pt needs in order to feel safe.  Pt referred to [facility], pending review.   Catha Gosselin, LCSWA  [phone] .04/08/2012 21:47pm   Pt accepted to [place], [address] under dr. Deneen Harts. CSW informed patient, RN, and EDP. Patient to be transported by [facility]. [phone].

## 2012-04-08 NOTE — ED Provider Notes (Signed)
10:27 PM per social work, patient has been accepted at [facility], Dr. Deneen Harts.    Ethelda Chick, MD 04/08/12 2227

## 2012-04-08 NOTE — ED Notes (Signed)
telepsych completed 

## 2012-04-25 ENCOUNTER — Ambulatory Visit: Payer: Self-pay

## 2012-05-23 ENCOUNTER — Other Ambulatory Visit: Payer: Self-pay | Admitting: Family Medicine

## 2012-05-23 DIAGNOSIS — M545 Low back pain, unspecified: Secondary | ICD-10-CM

## 2012-05-30 ENCOUNTER — Other Ambulatory Visit: Payer: Self-pay

## 2012-06-28 ENCOUNTER — Emergency Department (HOSPITAL_COMMUNITY)
Admission: EM | Admit: 2012-06-28 | Discharge: 2012-06-28 | Disposition: A | Discharge status: Home / Self Care | Payer: Medicaid Other | Attending: Emergency Medicine | Admitting: Emergency Medicine

## 2012-06-28 ENCOUNTER — Encounter (HOSPITAL_COMMUNITY): Payer: Self-pay | Admitting: *Deleted

## 2012-06-28 DIAGNOSIS — Z8739 Personal history of other diseases of the musculoskeletal system and connective tissue: Secondary | ICD-10-CM | POA: Insufficient documentation

## 2012-06-28 DIAGNOSIS — I10 Essential (primary) hypertension: Secondary | ICD-10-CM | POA: Insufficient documentation

## 2012-06-28 DIAGNOSIS — F411 Generalized anxiety disorder: Secondary | ICD-10-CM | POA: Insufficient documentation

## 2012-06-28 DIAGNOSIS — F319 Bipolar disorder, unspecified: Secondary | ICD-10-CM | POA: Insufficient documentation

## 2012-06-28 DIAGNOSIS — F419 Anxiety disorder, unspecified: Secondary | ICD-10-CM

## 2012-06-28 DIAGNOSIS — F603 Borderline personality disorder: Secondary | ICD-10-CM | POA: Insufficient documentation

## 2012-06-28 DIAGNOSIS — Z79899 Other long term (current) drug therapy: Secondary | ICD-10-CM | POA: Insufficient documentation

## 2012-06-28 DIAGNOSIS — R450 Nervousness: Secondary | ICD-10-CM | POA: Insufficient documentation

## 2012-06-28 DIAGNOSIS — Z8669 Personal history of other diseases of the nervous system and sense organs: Secondary | ICD-10-CM | POA: Insufficient documentation

## 2012-06-28 DIAGNOSIS — Z87828 Personal history of other (healed) physical injury and trauma: Secondary | ICD-10-CM | POA: Insufficient documentation

## 2012-06-28 DIAGNOSIS — G43909 Migraine, unspecified, not intractable, without status migrainosus: Secondary | ICD-10-CM | POA: Insufficient documentation

## 2012-06-28 HISTORY — DX: Other amnesia: R41.3

## 2012-06-28 HISTORY — DX: Unspecified injury of head, initial encounter: S09.90XA

## 2012-06-28 LAB — URINE MICROSCOPIC-ADD ON

## 2012-06-28 LAB — URINALYSIS, ROUTINE W REFLEX MICROSCOPIC
Nitrite: NEGATIVE
Specific Gravity, Urine: 1.016 (ref 1.005–1.030)
Urobilinogen, UA: 0.2 mg/dL (ref 0.0–1.0)
pH: 6.5 (ref 5.0–8.0)

## 2012-06-28 LAB — CBC
HCT: 39.1 % (ref 36.0–46.0)
Hemoglobin: 13 g/dL (ref 12.0–15.0)
MCH: 31.6 pg (ref 26.0–34.0)
MCHC: 33.2 g/dL (ref 30.0–36.0)
RBC: 4.11 MIL/uL (ref 3.87–5.11)

## 2012-06-28 LAB — RAPID URINE DRUG SCREEN, HOSP PERFORMED
Barbiturates: NOT DETECTED
Tetrahydrocannabinol: NOT DETECTED

## 2012-06-28 LAB — COMPREHENSIVE METABOLIC PANEL
ALT: 20 U/L (ref 0–35)
Alkaline Phosphatase: 73 U/L (ref 39–117)
BUN: 9 mg/dL (ref 6–23)
CO2: 29 mEq/L (ref 19–32)
GFR calc Af Amer: >90 mL/min (ref >90)
GFR calc non Af Amer: >90 mL/min (ref >90)
Glucose, Bld: 97 mg/dL (ref 70–99)
Potassium: 3.8 mEq/L (ref 3.5–5.1)
Sodium: 140 mEq/L (ref 135–145)
Total Protein: 7.6 g/dL (ref 6.0–8.3)

## 2012-06-28 LAB — ETHANOL: Alcohol, Ethyl (B): <11 mg/dL (ref 0–11)

## 2012-06-28 LAB — ACETAMINOPHEN LEVEL: Acetaminophen (Tylenol), Serum: <15 ug/mL (ref 10–30)

## 2012-06-28 NOTE — ED Provider Notes (Signed)
History    This chart was scribed for Junious Silk, PA working with Geoffery Lyons, MD by ED Scribe, Burman Nieves. This patient was seen in room WTR4/WLPT4 and the patient's care was started at 6:14 PM.   CSN: [id number]  Arrival date & time 06/28/12  1802   First MD Initiated Contact with Patient 06/28/12 1814      Chief Complaint  Patient presents with  . Medical Clearance    (Consider location/radiation/quality/duration/timing/severity/associated sxs/prior treatment) The history is provided by the patient. No language interpreter was used.   HPI Comments: Brittany [name] is a 47 y.o. female with h/o bipolar disorder and anxiety who was escorted by security from [place] who presents to the Emergency Department with intermittent anxiety. Pt states that as of now she is fine. [facility] states that pt was acting differently. Pt was feeling anxious with adoption issues with her daughter. She states she has been having panic attack. Pt denies any drug or alcohol use. Pt denies any HI, SI, or hallucinations. Pt's current PCP Lacie Scotts.   Past Medical History  Diagnosis Date  . Hypertension   . Bipolar 1 disorder   . Anxiety   . Back pain   . Bulging disc   . Ruptured intervertebral disc   . Migraine   . Head injury   . Short-term memory loss     Past Surgical History  Procedure Laterality Date  . Abdominal hysterectomy      Family History  Problem Relation Age of Onset  . Migraines Mother   . Migraines Father   . Migraines Sister     History  Substance Use Topics  . Smoking status: Never Smoker   . Smokeless tobacco: Never Used  . Alcohol Use: No    OB History   Grav Para Term Preterm Abortions TAB SAB Ect Mult Living                  Review of Systems  Psychiatric/Behavioral: The patient is nervous/anxious.   All other systems reviewed and are negative.    Allergies  Toradol  Home Medications   Current Outpatient Rx  Name  Route  Sig  Dispense  Refill  .  carisoprodol (SOMA) 350 MG tablet   Oral   Take 350 mg by mouth 3 (three) times daily as needed. For pain.         . DULoxetine (CYMBALTA) 60 MG capsule   Oral   Take 60 mg by mouth daily.         Marland Kitchen gabapentin (NEURONTIN) 800 MG tablet   Oral   Take 800 mg by mouth 4 (four) times daily - after meals and at bedtime.         . lamoTRIgine (LAMICTAL) 150 MG tablet   Oral   Take 1 tablet (150 mg total) by mouth 2 (two) times daily. For mood stabilization.   60 tablet   0   . levothyroxine (SYNTHROID, LEVOTHROID) 50 MCG tablet   Oral   Take 50 mcg by mouth daily before breakfast.          . phentermine (ADIPEX-P) 37.5 MG tablet   Oral   Take 37.5 mg by mouth daily before breakfast.         . zolpidem (AMBIEN) 10 MG tablet   Oral   Take 10 mg by mouth at bedtime as needed for sleep.           BP 106/68  Pulse 80  Temp(Src) 98.4  F (36.9 C) (Oral)  Resp 18  Wt 219 lb 2 oz (99.394 kg)  BMI 37.59 kg/m2  SpO2 94%  Physical Exam  Nursing note and vitals reviewed. Constitutional: She is oriented to person, place, and time. She appears well-developed and well-nourished. No distress.  Calm and alert during PE.  HENT:  Head: Normocephalic and atraumatic.  Right Ear: External ear normal.  Left Ear: External ear normal.  Nose: Nose normal.  Mouth/Throat: Oropharynx is clear and moist.  Eyes: Conjunctivae are normal. Pupils are equal, round, and reactive to light.  Neck: Normal range of motion.  Cardiovascular: Normal rate, regular rhythm and normal heart sounds.   Pulmonary/Chest: Effort normal and breath sounds normal. No stridor. No respiratory distress. She has no wheezes. She has no rales.  Abdominal: Soft. She exhibits no distension.  Musculoskeletal: Normal range of motion.  Neurological: She is alert and oriented to person, place, and time. She has normal strength.  Skin: Skin is warm and dry. She is not diaphoretic. No erythema.  Psychiatric: She has a  normal mood and affect. Her behavior is normal.    ED Course  Procedures (including critical care time) DIAGNOSTIC STUDIES: Oxygen Saturation is 94% on room air, adequate by my interpretation.    COORDINATION OF CARE: 6:43 PM Discussed ED treatment with pt and pt agrees.     Labs Reviewed  SALICYLATE LEVEL - Abnormal; Notable for the following:    Salicylate Lvl <2.0 (*)    All other components within normal limits  URINE RAPID DRUG SCREEN (HOSP PERFORMED) - Abnormal; Notable for the following:    Benzodiazepines POSITIVE (*)    All other components within normal limits  URINALYSIS, ROUTINE W REFLEX MICROSCOPIC - Abnormal; Notable for the following:    Bilirubin Urine SMALL (*)    Leukocytes, UA TRACE (*)    All other components within normal limits  URINE MICROSCOPIC-ADD ON - Abnormal; Notable for the following:    Squamous Epithelial / LPF FEW (*)    Bacteria, UA FEW (*)    All other components within normal limits  ACETAMINOPHEN LEVEL  CBC  COMPREHENSIVE METABOLIC PANEL  ETHANOL   No results found.   1. Anxiety   2. Bipolar affective   3. Borderline personality disorder       MDM  Patient sent from [facility] for labs. Labs wnl. Sent back to [facility]. Vital signs stable.       I personally performed the services described in this documentation, which was scribed in my presence. The recorded information has been reviewed and is accurate.     Mora Bellman, PA-C 07/10/12 1501

## 2012-06-28 NOTE — ED Notes (Signed)
Pt is IVC'd, states she doesn't know why she's here, states she's been taking her medication like prescribed.

## 2012-06-28 NOTE — ED Provider Notes (Signed)
Patient is currently under involuntary commitment for psychiatric reasons. Presents here for medical clearance. Patient is pleasant cooperative alert ambulatory, Glasgow Coma Score 15  Doug Sou, MD 06/28/12 [phone]

## 2012-06-28 NOTE — ED Notes (Signed)
Pt escorted by security from [employer], per paperwork says "pt appears very spacy and unable to answer questions, very anxious, need med clearance to r/o any drug or medical reasons for her presentation".

## 2012-06-29 ENCOUNTER — Encounter (HOSPITAL_COMMUNITY): Payer: Self-pay | Admitting: Emergency Medicine

## 2012-06-29 ENCOUNTER — Emergency Department (HOSPITAL_COMMUNITY)
Admission: EM | Admit: 2012-06-29 | Discharge: 2012-06-29 | Disposition: A | Discharge status: Home / Self Care | Payer: Medicaid Other | Attending: Emergency Medicine | Admitting: Emergency Medicine

## 2012-06-29 DIAGNOSIS — Z8739 Personal history of other diseases of the musculoskeletal system and connective tissue: Secondary | ICD-10-CM | POA: Insufficient documentation

## 2012-06-29 DIAGNOSIS — Z3202 Encounter for pregnancy test, result negative: Secondary | ICD-10-CM | POA: Insufficient documentation

## 2012-06-29 DIAGNOSIS — F319 Bipolar disorder, unspecified: Secondary | ICD-10-CM | POA: Insufficient documentation

## 2012-06-29 DIAGNOSIS — Z79899 Other long term (current) drug therapy: Secondary | ICD-10-CM | POA: Insufficient documentation

## 2012-06-29 DIAGNOSIS — I10 Essential (primary) hypertension: Secondary | ICD-10-CM | POA: Insufficient documentation

## 2012-06-29 DIAGNOSIS — F411 Generalized anxiety disorder: Secondary | ICD-10-CM | POA: Insufficient documentation

## 2012-06-29 DIAGNOSIS — Z87828 Personal history of other (healed) physical injury and trauma: Secondary | ICD-10-CM | POA: Insufficient documentation

## 2012-06-29 DIAGNOSIS — G43909 Migraine, unspecified, not intractable, without status migrainosus: Secondary | ICD-10-CM | POA: Insufficient documentation

## 2012-06-29 DIAGNOSIS — Z8669 Personal history of other diseases of the nervous system and sense organs: Secondary | ICD-10-CM | POA: Insufficient documentation

## 2012-06-29 LAB — COMPREHENSIVE METABOLIC PANEL
ALT: 19 U/L (ref 0–35)
AST: 17 U/L (ref 0–37)
Alkaline Phosphatase: 73 U/L (ref 39–117)
CO2: 28 mEq/L (ref 19–32)
Calcium: 9.2 mg/dL (ref 8.4–10.5)
GFR calc non Af Amer: >90 mL/min (ref >90)
Potassium: 4 mEq/L (ref 3.5–5.1)
Sodium: 139 mEq/L (ref 135–145)
Total Protein: 7.7 g/dL (ref 6.0–8.3)

## 2012-06-29 LAB — CBC
MCH: 32.2 pg (ref 26.0–34.0)
Platelets: 276 10*3/uL (ref 150–400)
RBC: 4.01 MIL/uL (ref 3.87–5.11)

## 2012-06-29 LAB — RAPID URINE DRUG SCREEN, HOSP PERFORMED
Amphetamines: NOT DETECTED
Opiates: NOT DETECTED

## 2012-06-29 LAB — ACETAMINOPHEN LEVEL: Acetaminophen (Tylenol), Serum: <15 ug/mL (ref 10–30)

## 2012-06-29 MED ORDER — CARISOPRODOL 350 MG PO TABS
350.0000 mg | ORAL_TABLET | Freq: Once | ORAL | Status: AC
  Administered 2012-06-29: 350 mg via ORAL

## 2012-06-29 NOTE — [facility] Counselor (Signed)
Telepsych initiated, SOC contacted via phone, consult form in addition to labs and clinicals were faxed to [facility].

## 2012-06-29 NOTE — ED Notes (Signed)
D/C instructions given along with a bus pass. Belongings returned, ambulatory, denies pain. Escorted by hospital security to front of emergency department.

## 2012-06-29 NOTE — ED Notes (Signed)
Pt brought in by [employer] from [place], IVC paper work with pt, Administrator, arts. Pt has history of narcotic abuse, reported aggressive and hostile. Pt calm upon arrival.

## 2012-06-29 NOTE — ED Provider Notes (Signed)
History     CSN: [id number]  Arrival date & time 06/29/12  1051   First MD Initiated Contact with Patient 06/29/12 1100      Chief Complaint  Patient presents with  . Medical Clearance    (Consider location/radiation/quality/duration/timing/severity/associated sxs/prior treatment) HPI Comments: Patient presents today from [facility] due to inability to get her medications there.  Patient apparently was IVC'd yesterday and was sent to [facility].  IVC paperwork stated that there was some concern about narcotic abuse and also agitation.  Today the patient denies SI or HI.  She reports that she is taking her medication as directed.  I called [facility] and was told that they do not have the patient's Synthroid, Lamictal, or pain medications there.  The Psychiatrist at [facility] was concerned that the patient was going to go through narcotic withdrawal if she could not get her pain medication.  She was therefore sent back here to get her home medications.  From taking to the nurse at [facility] the Psychiatrist at [facility] was also unsure if the patient needed to actually be there.  The patient denies any alcohol use.  She reports that she has chronic migraines and takes SOMA for her pain.    The history is provided by the patient.    Past Medical History  Diagnosis Date  . Hypertension   . Bipolar 1 disorder   . Anxiety   . Back pain   . Bulging disc   . Ruptured intervertebral disc   . Migraine   . Head injury   . Short-term memory loss     Past Surgical History  Procedure Laterality Date  . Abdominal hysterectomy      Family History  Problem Relation Age of Onset  . Migraines Mother   . Migraines Father   . Migraines Sister     History  Substance Use Topics  . Smoking status: Never Smoker   . Smokeless tobacco: Never Used  . Alcohol Use: No    OB History   Grav Para Term Preterm Abortions TAB SAB Ect Mult Living                  Review of Systems  Psychiatric/Behavioral:  Negative for suicidal ideas, hallucinations, confusion and self-injury.  All other systems reviewed and are negative.    Allergies  Toradol and Trazodone and nefazodone  Home Medications   Current Outpatient Rx  Name  Route  Sig  Dispense  Refill  . carisoprodol (SOMA) 350 MG tablet   Oral   Take 350 mg by mouth 3 (three) times daily as needed. For pain.         . diazepam (VALIUM) 10 MG tablet   Oral   Take 10 mg by mouth every 6 (six) hours as needed for anxiety.         . DULoxetine (CYMBALTA) 30 MG capsule   Oral   Take 90 mg by mouth every morning.         . gabapentin (NEURONTIN) 800 MG tablet   Oral   Take 800 mg by mouth 4 (four) times daily - after meals and at bedtime.         . lamoTRIgine (LAMICTAL) 25 MG tablet   Oral   Take 75 mg by mouth 2 (two) times daily.         Marland Kitchen levothyroxine (SYNTHROID, LEVOTHROID) 50 MCG tablet   Oral   Take 50 mcg by mouth daily before breakfast.          .  phentermine (ADIPEX-P) 37.5 MG tablet   Oral   Take 37.5 mg by mouth daily before breakfast.         . zolpidem (AMBIEN) 10 MG tablet   Oral   Take 10 mg by mouth at bedtime as needed for sleep.           BP 123/78  Pulse 74  Temp(Src) 97.5 F (36.4 C) (Oral)  Resp 18  SpO2 97%  Physical Exam  Nursing note and vitals reviewed. Constitutional: She appears well-developed and well-nourished.  HENT:  Head: Normocephalic and atraumatic.  Mouth/Throat: Oropharynx is clear and moist.  Eyes: EOM are normal. Pupils are equal, round, and reactive to light.  Neck: Normal range of motion. Neck supple.  Cardiovascular: Normal rate, regular rhythm and normal heart sounds.   Pulmonary/Chest: Effort normal and breath sounds normal.  Musculoskeletal: Normal range of motion.  Neurological: She is alert. She has normal strength. No cranial nerve deficit. Gait normal.  Skin: Skin is warm and dry.  Psychiatric: She has a normal mood and affect. Her speech is  normal. She is agitated. She is not aggressive, not actively hallucinating and not combative. She expresses no homicidal and no suicidal ideation. She expresses no suicidal plans and no homicidal plans.    ED Course  Procedures (including critical care time)  Labs Reviewed  COMPREHENSIVE METABOLIC PANEL - Abnormal; Notable for the following:    Glucose, Bld 100 (*)    All other components within normal limits  SALICYLATE LEVEL - Abnormal; Notable for the following:    Salicylate Lvl <2.0 (*)    All other components within normal limits  URINE RAPID DRUG SCREEN (HOSP PERFORMED) - Abnormal; Notable for the following:    Benzodiazepines POSITIVE (*)    All other components within normal limits  ACETAMINOPHEN LEVEL  CBC  ETHANOL   No results found.   No diagnosis found.  1:54 PM Patient evaluated by Telepsych who determined that the patient is stable for discharge.  MDM  Patient sent to the ED from [facility] due to inability to get her home medications.  The nurse at [facility] stated that they do not have pain medications, Synthroid, or Lamictal at their facility.  Patient denies SI or HI.  Patient alert, calm, and cooperative on exam.  Patient evaluated by Telepsych who felt that the patient was appropriate for discharge.  Patient discharged home.  Return precautions given.        Pascal Lux [place], PA-C 06/30/12 [phone]

## 2012-06-29 NOTE — ED Notes (Signed)
Up to the desk on the phone 

## 2012-06-30 LAB — URINE CULTURE

## 2012-07-03 NOTE — ED Provider Notes (Signed)
Medical screening examination/treatment/procedure(s) were performed by non-physician practitioner and as supervising physician I was immediately available for consultation/collaboration.   David H Yao, MD 07/03/12 0923 

## 2012-07-10 NOTE — ED Provider Notes (Signed)
Medical screening examination/treatment/procedure(s) were performed by non-physician practitioner and as supervising physician I was immediately available for consultation/collaboration.  Geoffery Lyons, MD 07/10/12 [phone]

## 2012-08-27 ENCOUNTER — Other Ambulatory Visit: Payer: Self-pay | Admitting: Family Medicine

## 2012-08-27 DIAGNOSIS — M542 Cervicalgia: Secondary | ICD-10-CM

## 2012-09-04 ENCOUNTER — Other Ambulatory Visit: Payer: Self-pay

## 2012-09-16 ENCOUNTER — Other Ambulatory Visit: Payer: Self-pay

## 2012-10-08 ENCOUNTER — Encounter (HOSPITAL_COMMUNITY): Payer: Self-pay

## 2012-10-08 ENCOUNTER — Emergency Department (HOSPITAL_COMMUNITY): Payer: Medicaid Other

## 2012-10-08 ENCOUNTER — Emergency Department (HOSPITAL_COMMUNITY)
Admission: EM | Admit: 2012-10-08 | Discharge: 2012-10-08 | Disposition: A | Discharge status: Home / Self Care | Payer: Medicaid Other | Attending: Emergency Medicine | Admitting: Emergency Medicine

## 2012-10-08 DIAGNOSIS — Z8739 Personal history of other diseases of the musculoskeletal system and connective tissue: Secondary | ICD-10-CM | POA: Insufficient documentation

## 2012-10-08 DIAGNOSIS — Z79899 Other long term (current) drug therapy: Secondary | ICD-10-CM | POA: Insufficient documentation

## 2012-10-08 DIAGNOSIS — X500XXA Overexertion from strenuous movement or load, initial encounter: Secondary | ICD-10-CM | POA: Insufficient documentation

## 2012-10-08 DIAGNOSIS — F411 Generalized anxiety disorder: Secondary | ICD-10-CM | POA: Insufficient documentation

## 2012-10-08 DIAGNOSIS — S93402A Sprain of unspecified ligament of left ankle, initial encounter: Secondary | ICD-10-CM

## 2012-10-08 DIAGNOSIS — S93409A Sprain of unspecified ligament of unspecified ankle, initial encounter: Secondary | ICD-10-CM | POA: Insufficient documentation

## 2012-10-08 DIAGNOSIS — Z87828 Personal history of other (healed) physical injury and trauma: Secondary | ICD-10-CM | POA: Insufficient documentation

## 2012-10-08 DIAGNOSIS — G43909 Migraine, unspecified, not intractable, without status migrainosus: Secondary | ICD-10-CM | POA: Insufficient documentation

## 2012-10-08 DIAGNOSIS — I10 Essential (primary) hypertension: Secondary | ICD-10-CM | POA: Insufficient documentation

## 2012-10-08 DIAGNOSIS — Y9289 Other specified places as the place of occurrence of the external cause: Secondary | ICD-10-CM | POA: Insufficient documentation

## 2012-10-08 DIAGNOSIS — F319 Bipolar disorder, unspecified: Secondary | ICD-10-CM | POA: Insufficient documentation

## 2012-10-08 DIAGNOSIS — Y9301 Activity, walking, marching and hiking: Secondary | ICD-10-CM | POA: Insufficient documentation

## 2012-10-08 HISTORY — DX: Depression, unspecified: F32.A

## 2012-10-08 HISTORY — DX: Major depressive disorder, single episode, unspecified: F32.9

## 2012-10-08 MED ORDER — HYDROCODONE-ACETAMINOPHEN 5-325 MG PO TABS: ORAL_TABLET | ORAL | Status: DC

## 2012-10-08 MED ORDER — OXYCODONE-ACETAMINOPHEN 5-325 MG PO TABS
2.0000 | ORAL_TABLET | Freq: Once | ORAL | Status: AC
  Administered 2012-10-08: 2 via ORAL
  Filled 2012-10-08: qty 2

## 2012-10-08 NOTE — ED Notes (Signed)
MD at bedside. 

## 2012-10-08 NOTE — ED Notes (Signed)
Per [facility]- Pt reports "lost her footing and fell NO LOC denies neck or back pain. C/o of left lateral ankle pain. Radiates up calf Good CMS. Peripheral pulse present

## 2012-10-08 NOTE — ED Provider Notes (Signed)
CSN: [id number]     Arrival date & time 10/08/12  1606 History   This chart was scribed for non-physician practitioner Junius Finner, PA-C working with Flint Melter, MD by Valera Castle, ED scribe. This patient was seen in room WTR6/WTR6 and the patient's care was started at 4:11 PM.    Chief Complaint  Patient presents with  . Fall  . Ankle Pain    left side    The history is provided by the patient. No language interpreter was used.   HPI Comments: Brittany [name] is a 47 y.o. female who presents to the Emergency Department complaining of left ankle pain onset when she fell while walking on gravel last night, with a severity of 10/10. Pain is difficult for pt to describe, states "I don't have the words." She denies hitting her head or LOC. Pt states she heard a "pop" sound in her ankle when the injury occurred. Pt states the pain is worsened by movement and bearing weight. Pt has tried elevation with no relief. Pt has not taken any medications for the pain. Pt has no previous medical history.    Past Medical History  Diagnosis Date  . Hypertension   . Bipolar 1 disorder   . Anxiety   . Back pain   . Bulging disc   . Ruptured intervertebral disc   . Migraine   . Head injury   . Short-term memory loss   . Depression    Past Surgical History  Procedure Laterality Date  . Abdominal hysterectomy     Family History  Problem Relation Age of Onset  . Migraines Mother   . Migraines Father   . Migraines Sister    History  Substance Use Topics  . Smoking status: Never Smoker   . Smokeless tobacco: Never Used  . Alcohol Use: No   OB History   Grav Para Term Preterm Abortions TAB SAB Ect Mult Living                 Review of Systems  Musculoskeletal: Positive for joint swelling and arthralgias.  Neurological: Negative for syncope.  All other systems reviewed and are negative.    Allergies  Toradol and Trazodone and nefazodone  Home Medications   Current Outpatient  Rx  Name  Route  Sig  Dispense  Refill  . alprazolam (XANAX) 2 MG tablet   Oral   Take 2 mg by mouth 3 (three) times daily as needed for anxiety.         . butalbital-acetaminophen-caffeine (FIORICET, ESGIC) 50-325-40 MG per tablet   Oral   Take 1 tablet by mouth every 6 (six) hours as needed for headache.         . carisoprodol (SOMA) 350 MG tablet   Oral   Take 350 mg by mouth 3 (three) times daily as needed for muscle spasms.          . DULoxetine (CYMBALTA) 30 MG capsule   Oral   Take 90 mg by mouth every morning.         . gabapentin (NEURONTIN) 800 MG tablet   Oral   Take 800 mg by mouth 4 (four) times daily - after meals and at bedtime.         . lamoTRIgine (LAMICTAL) 25 MG tablet   Oral   Take 75 mg by mouth 2 (two) times daily.         Marland Kitchen levothyroxine (SYNTHROID, LEVOTHROID) 50 MCG tablet   Oral  Take 50 mcg by mouth daily before breakfast.          . Multiple Vitamin (MULTIVITAMIN WITH MINERALS) TABS tablet   Oral   Take 1 tablet by mouth daily.         Marland Kitchen zolpidem (AMBIEN) 10 MG tablet   Oral   Take 10 mg by mouth at bedtime as needed for sleep.         Marland Kitchen HYDROcodone-acetaminophen (NORCO/VICODIN) 5-325 MG per tablet      Take 1-2 pills every 4-6 hours as needed for pain.   10 tablet   0     Physical Exam  Nursing note and vitals reviewed. Constitutional: She is oriented to person, place, and time. She appears well-developed and well-nourished.  Pt sitting in exam room, appears uncomfortable.  HENT:  Head: Normocephalic and atraumatic.  Eyes: EOM are normal.  Neck: Normal range of motion. Neck supple.  Cardiovascular: Normal rate.   Pulmonary/Chest: Effort normal.  Musculoskeletal: Normal range of motion.  Mild edema to left ankle. Tenderness to palpation on left lateral malleoli. Pain with plantar and dorsal flexion. Pain in left ankle with knee flexion. Pain with weight bearing. No ecchymosis, erythema, or warmth. Skin intact.    Neurological: She is alert and oriented to person, place, and time.  Skin: Skin is warm and dry.  Psychiatric: She has a normal mood and affect. Her behavior is normal.    ED Course  Procedures (including critical care time)  DIAGNOSTIC STUDIES: Oxygen Saturation is 100% on room air, normal by my interpretation.    COORDINATION OF CARE: 4:37 PM-Discussed treatment plan which includes xray for ankle with pt at bedside and pt agreed to plan.  Labs Review Labs Reviewed - No data to display Imaging Review Dg Ankle Complete Left  10/08/2012   *RADIOLOGY REPORT*  Clinical Data: Recent traumatic injury with pain  LEFT ANKLE COMPLETE - 3+ VIEW  Comparison: None.  Findings: Lateral soft tissue swelling is noted.  No acute fracture or dislocation is seen.  IMPRESSION: Soft tissue swelling without acute bony abnormality.   Original Report Authenticated By: Alcide Clever, M.D.    MDM   1. Left ankle sprain, initial encounter    Plain films negative for fx.  No other injuries.  Will tx as ankle sprain.  ASO splint, crutches, Rx: norco. F/u with [facility] to establish PCP, may need referral to orthopedic if pain does not improve.   I personally performed the services described in this documentation, which was scribed in my presence. The recorded information has been reviewed and is accurate.    Junius Finner, PA-C 10/08/12 [phone]

## 2012-10-08 NOTE — ED Notes (Signed)
Bed: WTR6 Expected date:  Expected time:  Means of arrival:  Comments: 47 y/o fall, ankle pain

## 2012-10-09 NOTE — ED Provider Notes (Signed)
Medical screening examination/treatment/procedure(s) were performed by non-physician practitioner and as supervising physician I was immediately available for consultation/collaboration.  Flint Melter, MD 10/09/12 [phone]

## 2012-10-23 ENCOUNTER — Emergency Department (INDEPENDENT_AMBULATORY_CARE_PROVIDER_SITE_OTHER): Payer: Medicaid Other

## 2012-10-23 ENCOUNTER — Emergency Department (HOSPITAL_COMMUNITY)
Admission: EM | Admit: 2012-10-23 | Discharge: 2012-10-23 | Disposition: A | Discharge status: Home / Self Care | Payer: Medicaid Other | Source: Home / Self Care | Attending: Family Medicine | Admitting: Family Medicine

## 2012-10-23 ENCOUNTER — Encounter (HOSPITAL_COMMUNITY): Payer: Self-pay

## 2012-10-23 DIAGNOSIS — Z5189 Encounter for other specified aftercare: Secondary | ICD-10-CM

## 2012-10-23 MED ORDER — HYDROCODONE-ACETAMINOPHEN 5-325 MG PO TABS: ORAL_TABLET | ORAL | Status: DC

## 2012-10-23 MED ORDER — HYDROCODONE-ACETAMINOPHEN 5-325 MG PO TABS
1.0000 | ORAL_TABLET | Freq: Once | ORAL | Status: AC
  Administered 2012-10-23: 1 via ORAL

## 2012-10-23 MED ORDER — HYDROCODONE-ACETAMINOPHEN 5-325 MG PO TABS
ORAL_TABLET | ORAL | Status: AC
  Filled 2012-10-23: qty 1

## 2012-10-23 NOTE — ED Provider Notes (Signed)
Brittany [name] is a 47 y.o. female who presents to Urgent Care today for left ankle pain. Patient suffered an inversion injury to her left ankle August 25th. She was seen in the emergency room and 26 and diagnosed with an ankle sprain. She notes severe ankle pain continuing today. Her pain is dramatically worse with walking. She notes continued ankle swelling and left leg swelling. Has run out of hydrocodone that she was prescribed. She denies any radiating pain weakness or numbness.   Patient is currently on permanent disability due to chronic back pain and mental health disorders   Past Medical History  Diagnosis Date  . Hypertension   . Bipolar 1 disorder   . Anxiety   . Back pain   . Bulging disc   . Ruptured intervertebral disc   . Migraine   . Head injury   . Short-term memory loss   . Depression    History  Substance Use Topics  . Smoking status: Never Smoker   . Smokeless tobacco: Never Used  . Alcohol Use: No   ROS as above Medications reviewed. No current facility-administered medications for this encounter.   Current Outpatient Prescriptions  Medication Sig Dispense Refill  . alprazolam (XANAX) 2 MG tablet Take 2 mg by mouth 3 (three) times daily as needed for anxiety.      . butalbital-acetaminophen-caffeine (FIORICET, ESGIC) 50-325-40 MG per tablet Take 1 tablet by mouth every 6 (six) hours as needed for headache.      . carisoprodol (SOMA) 350 MG tablet Take 350 mg by mouth 3 (three) times daily as needed for muscle spasms.       . DULoxetine (CYMBALTA) 30 MG capsule Take 90 mg by mouth every morning.      . gabapentin (NEURONTIN) 800 MG tablet Take 800 mg by mouth 4 (four) times daily - after meals and at bedtime.      Marland Kitchen HYDROcodone-acetaminophen (NORCO/VICODIN) 5-325 MG per tablet Take 1-2 pills every 4-6 hours as needed for pain.  20 tablet  0  . lamoTRIgine (LAMICTAL) 25 MG tablet Take 75 mg by mouth 2 (two) times daily.      Marland Kitchen levothyroxine (SYNTHROID,  LEVOTHROID) 50 MCG tablet Take 50 mcg by mouth daily before breakfast.       . Multiple Vitamin (MULTIVITAMIN WITH MINERALS) TABS tablet Take 1 tablet by mouth daily.      Marland Kitchen zolpidem (AMBIEN) 10 MG tablet Take 10 mg by mouth at bedtime as needed for sleep.        Exam:  BP 136/61  Pulse 117  Temp(Src) 97.6 F (36.4 C) (Oral)  Resp 16  SpO2 97% Gen: Well NAD LEFT ANKLE:  Swollen laterally. Tender palpation lateral malleolus and syndesmosis Capillary refill sensation intact distally Motion is intact.   No results found for this or any previous visit (from the past 24 hour(s)). Dg Ankle Complete Left  10/23/2012   *RADIOLOGY REPORT*  Clinical Data: Pain post trauma  LEFT ANKLE COMPLETE - 3+ VIEW  Comparison: October 08, 2012  Findings:  Frontal, oblique, lateral views were obtained.  There is soft tissue swelling.  There is no apparent fracture.  There is subtle widening of the mortise laterally, raising question of underlying ligamentous injury in this area.  There is no joint effusion. No erosive change.  IMPRESSION: Subtle widening of the mortise laterally on the frontal view. Suspect ligamentous injury in this area.  No fracture.  There is soft tissue swelling.   Original Report  Authenticated By: Bretta Bang, M.D.    Assessment and Plan: 47 y.o. female with suspected syndesmosis injury possible grade 3 ankle sprain. Possible unstable mortise. Plan: Place patient in a Personal assistant. Refer to orthopedic Hydrocodone as needed for pain Discussed warning signs or symptoms. Please see discharge instructions. Patient expresses understanding.      Rodolph Bong, MD 10/23/12 1410

## 2012-10-23 NOTE — ED Notes (Signed)
C/o pain in foot /ankle; pain not improved w ice

## 2012-12-12 ENCOUNTER — Encounter (HOSPITAL_COMMUNITY): Payer: Self-pay | Admitting: Emergency Medicine

## 2012-12-12 ENCOUNTER — Emergency Department (HOSPITAL_COMMUNITY): Payer: Medicaid Other

## 2012-12-12 ENCOUNTER — Inpatient Hospital Stay (HOSPITAL_COMMUNITY)
Admission: AD | Admit: 2012-12-12 | Discharge: 2012-12-18 | DRG: 885 | Disposition: A | Discharge status: Home / Self Care | Payer: Medicaid Other | Source: Intra-hospital | Attending: Psychiatry | Admitting: Psychiatry

## 2012-12-12 ENCOUNTER — Emergency Department (HOSPITAL_COMMUNITY)
Admission: EM | Admit: 2012-12-12 | Discharge: 2012-12-12 | Disposition: A | Discharge status: Other Acute Inpatient Hospital | Payer: Medicaid Other | Attending: Emergency Medicine | Admitting: Emergency Medicine

## 2012-12-12 ENCOUNTER — Encounter (HOSPITAL_COMMUNITY): Payer: Self-pay | Admitting: *Deleted

## 2012-12-12 DIAGNOSIS — F1310 Sedative, hypnotic or anxiolytic abuse, uncomplicated: Secondary | ICD-10-CM | POA: Insufficient documentation

## 2012-12-12 DIAGNOSIS — Z598 Other problems related to housing and economic circumstances: Secondary | ICD-10-CM

## 2012-12-12 DIAGNOSIS — G43909 Migraine, unspecified, not intractable, without status migrainosus: Secondary | ICD-10-CM | POA: Insufficient documentation

## 2012-12-12 DIAGNOSIS — F3160 Bipolar disorder, current episode mixed, unspecified: Principal | ICD-10-CM | POA: Diagnosis present

## 2012-12-12 DIAGNOSIS — Z23 Encounter for immunization: Secondary | ICD-10-CM | POA: Insufficient documentation

## 2012-12-12 DIAGNOSIS — F603 Borderline personality disorder: Secondary | ICD-10-CM

## 2012-12-12 DIAGNOSIS — I10 Essential (primary) hypertension: Secondary | ICD-10-CM | POA: Insufficient documentation

## 2012-12-12 DIAGNOSIS — F3110 Bipolar disorder, current episode manic without psychotic features, unspecified: Secondary | ICD-10-CM

## 2012-12-12 DIAGNOSIS — Z5987 Material hardship due to limited financial resources, not elsewhere classified: Secondary | ICD-10-CM

## 2012-12-12 DIAGNOSIS — Y929 Unspecified place or not applicable: Secondary | ICD-10-CM | POA: Insufficient documentation

## 2012-12-12 DIAGNOSIS — F319 Bipolar disorder, unspecified: Secondary | ICD-10-CM | POA: Diagnosis present

## 2012-12-12 DIAGNOSIS — E039 Hypothyroidism, unspecified: Secondary | ICD-10-CM | POA: Insufficient documentation

## 2012-12-12 DIAGNOSIS — Z8739 Personal history of other diseases of the musculoskeletal system and connective tissue: Secondary | ICD-10-CM | POA: Insufficient documentation

## 2012-12-12 DIAGNOSIS — S81009A Unspecified open wound, unspecified knee, initial encounter: Secondary | ICD-10-CM | POA: Insufficient documentation

## 2012-12-12 DIAGNOSIS — Z79899 Other long term (current) drug therapy: Secondary | ICD-10-CM | POA: Insufficient documentation

## 2012-12-12 DIAGNOSIS — F419 Anxiety disorder, unspecified: Secondary | ICD-10-CM

## 2012-12-12 DIAGNOSIS — N12 Tubulo-interstitial nephritis, not specified as acute or chronic: Secondary | ICD-10-CM

## 2012-12-12 DIAGNOSIS — R413 Other amnesia: Secondary | ICD-10-CM | POA: Insufficient documentation

## 2012-12-12 DIAGNOSIS — Z87828 Personal history of other (healed) physical injury and trauma: Secondary | ICD-10-CM | POA: Insufficient documentation

## 2012-12-12 DIAGNOSIS — W01119A Fall on same level from slipping, tripping and stumbling with subsequent striking against unspecified sharp object, initial encounter: Secondary | ICD-10-CM | POA: Insufficient documentation

## 2012-12-12 DIAGNOSIS — F411 Generalized anxiety disorder: Secondary | ICD-10-CM | POA: Diagnosis present

## 2012-12-12 DIAGNOSIS — Y9389 Activity, other specified: Secondary | ICD-10-CM | POA: Insufficient documentation

## 2012-12-12 HISTORY — DX: Hypothyroidism, unspecified: E03.9

## 2012-12-12 HISTORY — DX: Nontoxic goiter, unspecified: E04.9

## 2012-12-12 LAB — RAPID URINE DRUG SCREEN, HOSP PERFORMED
Amphetamines: NOT DETECTED
Barbiturates: POSITIVE — AB
Benzodiazepines: POSITIVE — AB
Cocaine: NOT DETECTED
Opiates: NOT DETECTED
Tetrahydrocannabinol: NOT DETECTED

## 2012-12-12 LAB — CBC WITH DIFFERENTIAL/PLATELET
Eosinophils Relative: 4 % (ref 0–5)
HCT: 36 % (ref 36.0–46.0)
Hemoglobin: 12.1 g/dL (ref 12.0–15.0)
Lymphocytes Relative: 26 % (ref 12–46)
Lymphs Abs: 2.7 10*3/uL (ref 0.7–4.0)
MCV: 96.3 fL (ref 78.0–100.0)
Monocytes Absolute: 0.8 10*3/uL (ref 0.1–1.0)
Monocytes Relative: 8 % (ref 3–12)
RBC: 3.74 MIL/uL — ABNORMAL LOW (ref 3.87–5.11)
RDW: 14.1 % (ref 11.5–15.5)
WBC: 10.3 10*3/uL (ref 4.0–10.5)

## 2012-12-12 LAB — URINALYSIS, ROUTINE W REFLEX MICROSCOPIC
Bilirubin Urine: NEGATIVE
Glucose, UA: NEGATIVE mg/dL
Hgb urine dipstick: NEGATIVE
Nitrite: NEGATIVE
Specific Gravity, Urine: 1.027 (ref 1.005–1.030)
pH: 6 (ref 5.0–8.0)

## 2012-12-12 LAB — COMPREHENSIVE METABOLIC PANEL
ALT: 16 U/L (ref 0–35)
AST: 18 U/L (ref 0–37)
Albumin: 4.1 g/dL (ref 3.5–5.2)
CO2: 21 mEq/L (ref 19–32)
Calcium: 9.6 mg/dL (ref 8.4–10.5)
Chloride: 101 mEq/L (ref 96–112)
Creatinine, Ser: 0.74 mg/dL (ref 0.50–1.10)
GFR calc Af Amer: >90 mL/min (ref >90)
GFR calc non Af Amer: >90 mL/min (ref >90)
Glucose, Bld: 97 mg/dL (ref 70–99)
Sodium: 136 mEq/L (ref 135–145)
Total Protein: 7.8 g/dL (ref 6.0–8.3)

## 2012-12-12 MED ORDER — ZIPRASIDONE MESYLATE 20 MG IM SOLR
10.0000 mg | Freq: Four times a day (QID) | INTRAMUSCULAR | Status: DC | PRN
  Filled 2012-12-12 (×2): qty 20

## 2012-12-12 MED ORDER — ALPRAZOLAM 1 MG PO TABS
2.0000 mg | ORAL_TABLET | Freq: Once | ORAL | Status: AC
  Administered 2012-12-12: 2 mg via ORAL
  Filled 2012-12-12: qty 2

## 2012-12-12 MED ORDER — ALPRAZOLAM 1 MG PO TABS
2.0000 mg | ORAL_TABLET | Freq: Three times a day (TID) | ORAL | Status: DC | PRN
  Administered 2012-12-12 – 2012-12-14 (×5): 2 mg via ORAL
  Filled 2012-12-12 (×5): qty 2

## 2012-12-12 MED ORDER — IBUPROFEN 100 MG/5ML PO SUSP
600.0000 mg | Freq: Four times a day (QID) | ORAL | Status: DC
  Filled 2012-12-12 (×2): qty 30

## 2012-12-12 MED ORDER — GABAPENTIN 400 MG PO CAPS
800.0000 mg | ORAL_CAPSULE | Freq: Three times a day (TID) | ORAL | Status: DC
  Administered 2012-12-12 – 2012-12-17 (×14): 800 mg via ORAL
  Filled 2012-12-12 (×19): qty 2

## 2012-12-12 MED ORDER — LEVOTHYROXINE SODIUM 50 MCG PO TABS
50.0000 ug | ORAL_TABLET | Freq: Every day | ORAL | Status: DC
  Administered 2012-12-13 – 2012-12-18 (×6): 50 ug via ORAL
  Filled 2012-12-12 (×7): qty 1

## 2012-12-12 MED ORDER — DULOXETINE HCL 60 MG PO CPEP
60.0000 mg | ORAL_CAPSULE | Freq: Every day | ORAL | Status: DC
  Filled 2012-12-12: qty 1

## 2012-12-12 MED ORDER — ZOLPIDEM TARTRATE 10 MG PO TABS: 10.0000 mg | ORAL_TABLET | Freq: Every evening | ORAL | Status: DC | PRN

## 2012-12-12 MED ORDER — IBUPROFEN 100 MG/5ML PO SUSP
600.0000 mg | Freq: Four times a day (QID) | ORAL | Status: DC | PRN
  Filled 2012-12-12: qty 30

## 2012-12-12 MED ORDER — LORAZEPAM 1 MG PO TABS
1.0000 mg | ORAL_TABLET | Freq: Three times a day (TID) | ORAL | Status: DC | PRN
  Administered 2012-12-12: 1 mg via ORAL
  Filled 2012-12-12: qty 1

## 2012-12-12 MED ORDER — BUTALBITAL-APAP-CAFFEINE 50-325-40 MG PO TABS
2.0000 | ORAL_TABLET | Freq: Two times a day (BID) | ORAL | Status: DC | PRN
  Administered 2012-12-12 – 2012-12-16 (×10): 2 via ORAL
  Filled 2012-12-12 (×9): qty 2
  Filled 2012-12-12 (×2): qty 1

## 2012-12-12 MED ORDER — GABAPENTIN 400 MG PO CAPS
800.0000 mg | ORAL_CAPSULE | Freq: Three times a day (TID) | ORAL | Status: DC
  Filled 2012-12-12: qty 2

## 2012-12-12 MED ORDER — ZOLPIDEM TARTRATE 5 MG PO TABS
5.0000 mg | ORAL_TABLET | Freq: Every evening | ORAL | Status: DC | PRN
  Administered 2012-12-12 – 2012-12-16 (×4): 5 mg via ORAL
  Filled 2012-12-12 (×5): qty 1

## 2012-12-12 MED ORDER — ADULT MULTIVITAMIN W/MINERALS CH
1.0000 | ORAL_TABLET | Freq: Every day | ORAL | Status: DC
  Administered 2012-12-12 – 2012-12-18 (×7): 1 via ORAL
  Filled 2012-12-12 (×8): qty 1

## 2012-12-12 MED ORDER — ZOLPIDEM TARTRATE 5 MG PO TABS: 5.0000 mg | ORAL_TABLET | Freq: Every evening | ORAL | Status: DC | PRN

## 2012-12-12 MED ORDER — LORAZEPAM 1 MG PO TABS
1.0000 mg | ORAL_TABLET | Freq: Three times a day (TID) | ORAL | Status: DC | PRN
  Administered 2012-12-12: 1 mg via ORAL

## 2012-12-12 MED ORDER — NICOTINE 21 MG/24HR TD PT24: 21.0000 mg | MEDICATED_PATCH | Freq: Every day | TRANSDERMAL | Status: DC

## 2012-12-12 MED ORDER — BUTALBITAL-APAP-CAFFEINE 50-325-40 MG PO TABS
2.0000 | ORAL_TABLET | Freq: Once | ORAL | Status: AC
  Administered 2012-12-12: 2 via ORAL
  Filled 2012-12-12: qty 2

## 2012-12-12 MED ORDER — LAMOTRIGINE 100 MG PO TABS
100.0000 mg | ORAL_TABLET | Freq: Two times a day (BID) | ORAL | Status: DC
  Filled 2012-12-12: qty 1

## 2012-12-12 MED ORDER — ADULT MULTIVITAMIN W/MINERALS CH: 1.0000 | ORAL_TABLET | Freq: Every day | ORAL | Status: DC

## 2012-12-12 MED ORDER — ALUM & MAG HYDROXIDE-SIMETH 200-200-20 MG/5ML PO SUSP: 30.0000 mL | ORAL | Status: DC | PRN

## 2012-12-12 MED ORDER — TETANUS-DIPHTH-ACELL PERTUSSIS 5-2.5-18.5 LF-MCG/0.5 IM SUSP
0.5000 mL | Freq: Once | INTRAMUSCULAR | Status: AC
  Administered 2012-12-12: 0.5 mL via INTRAMUSCULAR
  Filled 2012-12-12: qty 0.5

## 2012-12-12 MED ORDER — LORAZEPAM 1 MG PO TABS
1.0000 mg | ORAL_TABLET | Freq: Once | ORAL | Status: DC
  Filled 2012-12-12: qty 1

## 2012-12-12 MED ORDER — IBUPROFEN 200 MG PO TABS
600.0000 mg | ORAL_TABLET | Freq: Three times a day (TID) | ORAL | Status: DC | PRN
  Administered 2012-12-12: 600 mg via ORAL
  Filled 2012-12-12: qty 3

## 2012-12-12 MED ORDER — CARISOPRODOL 350 MG PO TABS
350.0000 mg | ORAL_TABLET | Freq: Once | ORAL | Status: AC
  Administered 2012-12-12: 350 mg via ORAL
  Filled 2012-12-12: qty 1

## 2012-12-12 MED ORDER — ACETAMINOPHEN 325 MG PO TABS: 650.0000 mg | ORAL_TABLET | ORAL | Status: DC | PRN

## 2012-12-12 MED ORDER — HYDROCODONE-ACETAMINOPHEN 5-325 MG PO TABS
2.0000 | ORAL_TABLET | Freq: Once | ORAL | Status: AC
  Administered 2012-12-12: 2 via ORAL
  Filled 2012-12-12: qty 2

## 2012-12-12 MED ORDER — LAMOTRIGINE 100 MG PO TABS
200.0000 mg | ORAL_TABLET | Freq: Two times a day (BID) | ORAL | Status: DC
  Administered 2012-12-12 – 2012-12-18 (×12): 200 mg via ORAL
  Filled 2012-12-12 (×10): qty 1
  Filled 2012-12-12: qty 2
  Filled 2012-12-12 (×4): qty 1

## 2012-12-12 MED ORDER — STERILE WATER FOR INJECTION IJ SOLN
INTRAMUSCULAR | Status: AC
  Filled 2012-12-12: qty 10

## 2012-12-12 MED ORDER — ONDANSETRON HCL 4 MG PO TABS: 4.0000 mg | ORAL_TABLET | Freq: Three times a day (TID) | ORAL | Status: DC | PRN

## 2012-12-12 MED ORDER — DULOXETINE HCL 60 MG PO CPEP
60.0000 mg | ORAL_CAPSULE | Freq: Every day | ORAL | Status: DC
  Administered 2012-12-12 – 2012-12-18 (×7): 60 mg via ORAL
  Filled 2012-12-12 (×9): qty 1

## 2012-12-12 MED ORDER — LEVOTHYROXINE SODIUM 50 MCG PO TABS
50.0000 ug | ORAL_TABLET | Freq: Every day | ORAL | Status: DC
  Filled 2012-12-12: qty 1

## 2012-12-12 MED ORDER — CARISOPRODOL 350 MG PO TABS
350.0000 mg | ORAL_TABLET | Freq: Three times a day (TID) | ORAL | Status: DC | PRN
  Administered 2012-12-12 – 2012-12-16 (×12): 350 mg via ORAL
  Filled 2012-12-12 (×12): qty 1

## 2012-12-12 MED ORDER — ZIPRASIDONE MESYLATE 20 MG IM SOLR: 10.0000 mg | Freq: Once | INTRAMUSCULAR | Status: DC

## 2012-12-12 MED ORDER — MAGNESIUM HYDROXIDE 400 MG/5ML PO SUSP: 30.0000 mL | Freq: Every day | ORAL | Status: DC | PRN

## 2012-12-12 MED ORDER — ACETAMINOPHEN 325 MG PO TABS
650.0000 mg | ORAL_TABLET | Freq: Four times a day (QID) | ORAL | Status: DC | PRN
  Administered 2012-12-12 – 2012-12-13 (×3): 650 mg via ORAL
  Filled 2012-12-12 (×3): qty 2

## 2012-12-12 NOTE — ED Notes (Signed)
Patient noted to have multiple abrasions and self-inflicted lacerations to the right lower extremity and left wrist Patient states "I'm a cutter, see?" Wound care given, see documentation

## 2012-12-12 NOTE — ED Notes (Signed)
Pt. In bed, eyes closed, breathing wnl. Asleep.

## 2012-12-12 NOTE — ED Notes (Signed)
Pt presents to the ED with her fiance, she is extremely manic at present, she is holding a picture of her deceased father, she also has superficial and abrasions to her lower right leg, she states that she fell into glass when she was arranging a picture. Finance stated at triage window that she needs detox from her xanax

## 2012-12-12 NOTE — Consult Note (Signed)
[facility] Face-to-Face Psychiatry Consult   Reason for Consult:  Manic behavior Referring Physician:  ER MD  Brittany [name] is an 47 y.o. female.  Assessment: AXIS I:  Bipolar, Manic AXIS II:  Deferred AXIS III:   Past Medical History  Diagnosis Date  . Hypertension   . Bipolar 1 disorder   . Anxiety   . Back pain   . Bulging disc   . Ruptured intervertebral disc   . Migraine   . Head injury   . Short-term memory loss   . Depression    AXIS IV:  problems related to social environment AXIS V:  41-50 serious symptoms  Plan:  Recommend psychiatric Inpatient admission when medically cleared.  Subjective:   Brittany [name] is a 47 y.o. female patient admitted with manic behavior.  HPI:  Ms Brittany [name] is pacing, talking rapidly with very labile mood swings.  She says the anniversary of her father's death in her arms and her daughter's birthday are both on Monday and she has worked herself into a state of anxiety, grief and happiness for her daughter all mixed together.  He speech is pressured, circumstantial but still understandable.  She says she has been diagnosed with Bipolar disorder but she believes she has depression and serious anxiety,  She also has migraines and is having one even as she is talking to [place].  Her gestures are overly dramatic but heartfelt. HPI Elements:   Location:  ER. Quality:  manic. Severity:  moderate. Timing:  anniversies coming up. Duration:  years. Context:  upcoming anniverseries.  Past Psychiatric History: Past Medical History  Diagnosis Date  . Hypertension   . Bipolar 1 disorder   . Anxiety   . Back pain   . Bulging disc   . Ruptured intervertebral disc   . Migraine   . Head injury   . Short-term memory loss   . Depression     reports that she has never smoked. She has never used smokeless tobacco. She reports that she does not drink alcohol or use illicit drugs. Family History  Problem Relation Age of Onset  . Migraines Mother   . Migraines  Father   . Migraines Sister            Allergies:   Allergies  Allergen Reactions  . Geodon [Ziprasidone Hcl]     Scratches face  . Toradol [Ketorolac Tromethamine] Swelling  . Trazodone And Nefazodone     Bladder infection    ACT Assessment Complete:  Yes:    Educational Status    Risk to Self: Risk to self Is patient at risk for suicide?: No Substance abuse history and/or treatment for substance abuse?: No (pt.'s ETOH <11, +for benzo's.)  Risk to Others:    Abuse:    Prior Inpatient Therapy:    Prior Outpatient Therapy:    Additional Information:                    Objective: Blood pressure 126/83, pulse 82, temperature 98.1 F (36.7 C), temperature source Oral, resp. rate 18, SpO2 94.00%.There is no weight on file to calculate BMI. Results for orders placed during the hospital encounter of 12/12/12 (from the past 72 hour(s))  CBC WITH DIFFERENTIAL     Status: Abnormal   Collection Time    12/12/12  8:00 AM      Result Value Range   WBC 10.3  4.0 - 10.5 K/uL   RBC 3.74 (*) 3.87 - 5.11 MIL/uL  Hemoglobin 12.1  12.0 - 15.0 g/dL   HCT 09.8  11.9 - 14.7 %   MCV 96.3  78.0 - 100.0 fL   MCH 32.4  26.0 - 34.0 pg   MCHC 33.6  30.0 - 36.0 g/dL   RDW 82.9  56.2 - 13.0 %   Platelets 316  150 - 400 K/uL   Neutrophils Relative % 62  43 - 77 %   Neutro Abs 6.4  1.7 - 7.7 K/uL   Lymphocytes Relative 26  12 - 46 %   Lymphs Abs 2.7  0.7 - 4.0 K/uL   Monocytes Relative 8  3 - 12 %   Monocytes Absolute 0.8  0.1 - 1.0 K/uL   Eosinophils Relative 4  0 - 5 %   Eosinophils Absolute 0.4  0.0 - 0.7 K/uL   Basophils Relative 1  0 - 1 %   Basophils Absolute 0.1  0.0 - 0.1 K/uL  COMPREHENSIVE METABOLIC PANEL     Status: Abnormal   Collection Time    12/12/12  8:00 AM      Result Value Range   Sodium 136  135 - 145 mEq/L   Potassium 3.4 (*) 3.5 - 5.1 mEq/L   Chloride 101  96 - 112 mEq/L   CO2 21  19 - 32 mEq/L   Glucose, Bld 97  70 - 99 mg/dL   BUN 9  6 - 23 mg/dL    Creatinine, Ser 8.65  0.50 - 1.10 mg/dL   Calcium 9.6  8.4 - 78.4 mg/dL   Total Protein 7.8  6.0 - 8.3 g/dL   Albumin 4.1  3.5 - 5.2 g/dL   AST 18  0 - 37 U/L   ALT 16  0 - 35 U/L   Alkaline Phosphatase 98  39 - 117 U/L   Total Bilirubin 0.3  0.3 - 1.2 mg/dL   GFR calc non Af Amer >90  >90 mL/min   GFR calc Af Amer >90  >90 mL/min   Comment: (NOTE)     The eGFR has been calculated using the CKD EPI equation.     This calculation has not been validated in all clinical situations.     eGFR's persistently <90 mL/min signify possible Chronic Kidney     Disease.  ACETAMINOPHEN LEVEL     Status: None   Collection Time    12/12/12  8:00 AM      Result Value Range   Acetaminophen (Tylenol), Serum <15.0  10 - 30 ug/mL   Comment:            THERAPEUTIC CONCENTRATIONS VARY     SIGNIFICANTLY. A RANGE OF 10-30     ug/mL MAY BE AN EFFECTIVE     CONCENTRATION FOR MANY PATIENTS.     HOWEVER, SOME ARE BEST TREATED     AT CONCENTRATIONS OUTSIDE THIS     RANGE.     ACETAMINOPHEN CONCENTRATIONS     >150 ug/mL AT 4 HOURS AFTER     INGESTION AND >50 ug/mL AT 12     HOURS AFTER INGESTION ARE     OFTEN ASSOCIATED WITH TOXIC     REACTIONS.  SALICYLATE LEVEL     Status: Abnormal   Collection Time    12/12/12  8:00 AM      Result Value Range   Salicylate Lvl <2.0 (*) 2.8 - 20.0 mg/dL  ETHANOL     Status: None   Collection Time    12/12/12  8:00  AM      Result Value Range   Alcohol, Ethyl (B) <11  0 - 11 mg/dL   Comment:            LOWEST DETECTABLE LIMIT FOR     SERUM ALCOHOL IS 11 mg/dL     FOR MEDICAL PURPOSES ONLY  URINALYSIS, ROUTINE W REFLEX MICROSCOPIC     Status: None   Collection Time    12/12/12  8:52 AM      Result Value Range   Color, Urine YELLOW  YELLOW   APPearance CLEAR  CLEAR   Specific Gravity, Urine 1.027  1.005 - 1.030   pH 6.0  5.0 - 8.0   Glucose, UA NEGATIVE  NEGATIVE mg/dL   Hgb urine dipstick NEGATIVE  NEGATIVE   Bilirubin Urine NEGATIVE  NEGATIVE   Ketones,  ur NEGATIVE  NEGATIVE mg/dL   Protein, ur NEGATIVE  NEGATIVE mg/dL   Urobilinogen, UA 0.2  0.0 - 1.0 mg/dL   Nitrite NEGATIVE  NEGATIVE   Leukocytes, UA NEGATIVE  NEGATIVE   Comment: MICROSCOPIC NOT DONE ON URINES WITH NEGATIVE PROTEIN, BLOOD, LEUKOCYTES, NITRITE, OR GLUCOSE <1000 mg/dL.  URINE RAPID DRUG SCREEN (HOSP PERFORMED)     Status: Abnormal   Collection Time    12/12/12  8:52 AM      Result Value Range   Opiates NONE DETECTED  NONE DETECTED   Cocaine NONE DETECTED  NONE DETECTED   Benzodiazepines POSITIVE (*) NONE DETECTED   Amphetamines NONE DETECTED  NONE DETECTED   Tetrahydrocannabinol NONE DETECTED  NONE DETECTED   Barbiturates POSITIVE (*) NONE DETECTED   Comment:            DRUG SCREEN FOR MEDICAL PURPOSES     ONLY.  IF CONFIRMATION IS NEEDED     FOR ANY PURPOSE, NOTIFY LAB     WITHIN 5 DAYS.                LOWEST DETECTABLE LIMITS     FOR URINE DRUG SCREEN     Drug Class       Cutoff (ng/mL)     Amphetamine      1000     Barbiturate      200     Benzodiazepine   200     Tricyclics       300     Opiates          300     Cocaine          300     THC              50   Labs are reviewed and are pertinent for no psychiatric issue.  Current Facility-Administered Medications  Medication Dose Route Frequency Provider Last Rate Last Dose  . acetaminophen (TYLENOL) tablet 650 mg  650 mg Oral Q4H PRN Gwyneth Sprout, MD      . ibuprofen (ADVIL,MOTRIN) tablet 600 mg  600 mg Oral Q8H PRN Gwyneth Sprout, MD      . LORazepam (ATIVAN) tablet 1 mg  1 mg Oral Once Roxy Horseman, PA-C      . LORazepam (ATIVAN) tablet 1 mg  1 mg Oral Q8H PRN Gwyneth Sprout, MD      . nicotine (NICODERM CQ - dosed in mg/24 hours) patch 21 mg  21 mg Transdermal Daily Gwyneth Sprout, MD      . ondansetron (ZOFRAN) tablet 4 mg  4 mg Oral Q8H PRN Gwyneth Sprout, MD      .  sterile water (preservative free) injection           . ziprasidone (GEODON) injection 10 mg  10 mg Intramuscular Q6H  PRN Roxy Horseman, PA-C      . zolpidem (AMBIEN) tablet 5 mg  5 mg Oral QHS PRN Gwyneth Sprout, MD       Current Outpatient Prescriptions  Medication Sig Dispense Refill  . alprazolam (XANAX) 2 MG tablet Take 2 mg by mouth 3 (three) times daily as needed for anxiety.      . butalbital-acetaminophen-caffeine (FIORICET, ESGIC) 50-325-40 MG per tablet Take 2 tablets by mouth 2 (two) times daily.       . carisoprodol (SOMA) 350 MG tablet Take 350 mg by mouth 3 (three) times daily as needed for muscle spasms.       . Cholecalciferol (VITAMIN D-3) 5000 UNITS TABS Take 1 tablet by mouth daily.      . DULoxetine (CYMBALTA) 30 MG capsule Take 90 mg by mouth every morning.      . gabapentin (NEURONTIN) 800 MG tablet Take 800 mg by mouth 3 (three) times daily.       Marland Kitchen HYDROcodone-acetaminophen (NORCO) 10-325 MG per tablet Take 1 tablet by mouth every 6 (six) hours as needed for pain.      Marland Kitchen lamoTRIgine (LAMICTAL) 100 MG tablet Take 200 mg by mouth 2 (two) times daily.      Marland Kitchen levothyroxine (SYNTHROID, LEVOTHROID) 50 MCG tablet Take 50 mcg by mouth daily before breakfast.       . Multiple Vitamin (MULTIVITAMIN WITH MINERALS) TABS tablet Take 1 tablet by mouth daily.      Marland Kitchen zolpidem (AMBIEN) 10 MG tablet Take 10 mg by mouth at bedtime as needed for sleep.        Psychiatric Specialty Exam:     Blood pressure 126/83, pulse 82, temperature 98.1 F (36.7 C), temperature source Oral, resp. rate 18, SpO2 94.00%.There is no weight on file to calculate BMI.  General Appearance: Casual  Eye Contact::  Good  Speech:  Pressured  Volume:  Normal  Mood:  Anxious and Depressed  Affect:  Labile  Thought Process:  Circumstantial  Orientation:  Full (Time, Place, and Person)  Thought Content:  Negative  Suicidal Thoughts:  No  Homicidal Thoughts:  No  Memory:  Immediate;   Good Recent;   Good Remote;   Good  Judgement:  Impaired  Insight:  Fair  Psychomotor Activity:  Increased  Concentration:  Poor   Recall:  Good  Akathisia:  Negative  Handed:  Right  AIMS (if indicated):     Assets:  Communication Skills Desire for Improvement Financial Resources/Insurance Housing Intimacy Leisure Time Physical Health Social Support Transportation  Sleep:      Treatment Plan Summary: Daily contact with patient to assess and evaluate symptoms and progress in treatment Medication management recommend inpatient for treatment of manic state.  Did order the medication she takes for pain including 2 mg Xanax  Garv Kuechle D 12/12/2012 12:29 PM

## 2012-12-12 NOTE — ED Notes (Signed)
Pt. Has an order for a nicoderm patch, pt. Asleep, will continue to monitor and when pt. Is awake, will check if pt. Wants one.

## 2012-12-12 NOTE — Tx Team (Signed)
Initial Interdisciplinary Treatment Plan  PATIENT STRENGTHS: (choose at least two) Average or above average intelligence Capable of independent living Communication skills  PATIENT STRESSORS: Financial difficulties Health problems Loss of father   PROBLEM LIST: Problem List/Patient Goals Date to be addressed Date deferred Reason deferred Estimated date of resolution  Want to decrease anxiety 12 Dec 2012     Mood disorder, mania 12 Dec 2012     I am really depressed and sad 12 Dec 2012                                          DISCHARGE CRITERIA:  Ability to meet basic life and health needs Adequate post-discharge living arrangements Improved stabilization in mood, thinking, and/or behavior Medical problems require only outpatient monitoring Motivation to continue treatment in a less acute level of care  PRELIMINARY DISCHARGE PLAN: Attend aftercare/continuing care group Outpatient therapy  PATIENT/FAMIILY INVOLVEMENT: This treatment plan has been presented to and reviewed with the patient, Brittany [name], and/or family member.  The patient and family have been given the opportunity to ask questions and make suggestions.  Brittany [name] 12/12/2012, 7:48 PM

## 2012-12-12 NOTE — ED Notes (Signed)
Per Derwood Kaplan, NP at psych ED/[facility], ok to give pt. Ativan 1 hour early.
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
## 2012-12-12 NOTE — ED Notes (Signed)
Report called to [place], Charity fundraiser at [facility].  BH ready for pt. At 1600.

## 2012-12-12 NOTE — ED Notes (Signed)
Withdrew Geodon, IM, 10mg , pt. Allergic to this medication, wasted med.
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
## 2012-12-12 NOTE — ED Notes (Signed)
Pt manic, fixated on father's death, updated on plan and medicated, pt denies SI/HI and sts that she wants help managing grief

## 2012-12-12 NOTE — ED Provider Notes (Signed)
CSN: [id number]     Arrival date & time 12/12/12  0622 History   First MD Initiated Contact with Patient 12/12/12 [phone]     Chief Complaint  Patient presents with  . Medical Clearance  . Fall  . Extremity Laceration   (Consider location/radiation/quality/duration/timing/severity/associated sxs/prior Treatment) HPI Comments: Patient presents emergency department with chief complaint of medical clearance. She states that she's been under a lot stress lately. She states that her father recently died. She also states that she has not seen her daughter in 4 years, and that currently the people caring for her daughter are seeking to gain guardianship. Patient states that she has felt very depressed. She has a history of cutting herself. She states that sometimes she feels "hostile." She denies suicidal or homicidal ideation. She states that she also recently fell into some glass and the glass cut her right leg. Her last tetanus shot is unknown. Complaining of 10 out of 10 pain to right leg.   The history is provided by the patient. No language interpreter was used.    Past Medical History  Diagnosis Date  . Hypertension   . Bipolar 1 disorder   . Anxiety   . Back pain   . Bulging disc   . Ruptured intervertebral disc   . Migraine   . Head injury   . Short-term memory loss   . Depression    Past Surgical History  Procedure Laterality Date  . Abdominal hysterectomy     Family History  Problem Relation Age of Onset  . Migraines Mother   . Migraines Father   . Migraines Sister    History  Substance Use Topics  . Smoking status: Never Smoker   . Smokeless tobacco: Never Used  . Alcohol Use: No   OB History   Grav Para Term Preterm Abortions TAB SAB Ect Mult Living                 Review of Systems  All other systems reviewed and are negative.    Allergies  Toradol and Trazodone and nefazodone  Home Medications   Current Outpatient Rx  Name  Route  Sig  Dispense   Refill  . alprazolam (XANAX) 2 MG tablet   Oral   Take 2 mg by mouth 3 (three) times daily as needed for anxiety.         . butalbital-acetaminophen-caffeine (FIORICET, ESGIC) 50-325-40 MG per tablet   Oral   Take 1 tablet by mouth every 6 (six) hours as needed for headache.         . carisoprodol (SOMA) 350 MG tablet   Oral   Take 350 mg by mouth 3 (three) times daily as needed for muscle spasms.          . DULoxetine (CYMBALTA) 30 MG capsule   Oral   Take 90 mg by mouth every morning.         . gabapentin (NEURONTIN) 800 MG tablet   Oral   Take 800 mg by mouth 4 (four) times daily - after meals and at bedtime.         Marland Kitchen HYDROcodone-acetaminophen (NORCO/VICODIN) 5-325 MG per tablet      Take 1-2 pills every 4-6 hours as needed for pain.   20 tablet   0   . lamoTRIgine (LAMICTAL) 25 MG tablet   Oral   Take 75 mg by mouth 2 (two) times daily.         Marland Kitchen levothyroxine (SYNTHROID,  LEVOTHROID) 50 MCG tablet   Oral   Take 50 mcg by mouth daily before breakfast.          . Multiple Vitamin (MULTIVITAMIN WITH MINERALS) TABS tablet   Oral   Take 1 tablet by mouth daily.         Marland Kitchen zolpidem (AMBIEN) 10 MG tablet   Oral   Take 10 mg by mouth at bedtime as needed for sleep.          BP 145/130  Pulse 80  Temp(Src) 98.8 F (37.1 C) (Oral)  Resp 24  SpO2 96% Physical Exam  Nursing note and vitals reviewed. Constitutional: She is oriented to person, place, and time. She appears well-developed and well-nourished.  HENT:  Head: Normocephalic and atraumatic.  Eyes: Conjunctivae and EOM are normal. Pupils are equal, round, and reactive to light.  Neck: Normal range of motion. Neck supple.  Cardiovascular: Normal rate and regular rhythm.  Exam reveals no gallop and no friction rub.   No murmur heard. Pulmonary/Chest: Effort normal and breath sounds normal. No respiratory distress. She has no wheezes. She has no rales. She exhibits no tenderness.  Abdominal:  Soft. She exhibits no distension and no mass. There is no tenderness. There is no rebound and no guarding.  Musculoskeletal: Normal range of motion. She exhibits no edema and no tenderness.  Neurological: She is alert and oriented to person, place, and time.  Skin: Skin is warm and dry.  Several lacerations to the right lower extremity, looks to be self-inflicted, nothing requiring repair  Psychiatric:  Manic    ED Course  Procedures (including critical care time) Results for orders placed during the hospital encounter of 12/12/12  CBC WITH DIFFERENTIAL      Result Value Range   WBC 10.3  4.0 - 10.5 K/uL   RBC 3.74 (*) 3.87 - 5.11 MIL/uL   Hemoglobin 12.1  12.0 - 15.0 g/dL   HCT 16.1  09.6 - 04.5 %   MCV 96.3  78.0 - 100.0 fL   MCH 32.4  26.0 - 34.0 pg   MCHC 33.6  30.0 - 36.0 g/dL   RDW 40.9  81.1 - 91.4 %   Platelets 316  150 - 400 K/uL   Neutrophils Relative % 62  43 - 77 %   Neutro Abs 6.4  1.7 - 7.7 K/uL   Lymphocytes Relative 26  12 - 46 %   Lymphs Abs 2.7  0.7 - 4.0 K/uL   Monocytes Relative 8  3 - 12 %   Monocytes Absolute 0.8  0.1 - 1.0 K/uL   Eosinophils Relative 4  0 - 5 %   Eosinophils Absolute 0.4  0.0 - 0.7 K/uL   Basophils Relative 1  0 - 1 %   Basophils Absolute 0.1  0.0 - 0.1 K/uL  COMPREHENSIVE METABOLIC PANEL      Result Value Range   Sodium 136  135 - 145 mEq/L   Potassium 3.4 (*) 3.5 - 5.1 mEq/L   Chloride 101  96 - 112 mEq/L   CO2 21  19 - 32 mEq/L   Glucose, Bld 97  70 - 99 mg/dL   BUN 9  6 - 23 mg/dL   Creatinine, Ser 7.82  0.50 - 1.10 mg/dL   Calcium 9.6  8.4 - 95.6 mg/dL   Total Protein 7.8  6.0 - 8.3 g/dL   Albumin 4.1  3.5 - 5.2 g/dL   AST 18  0 - 37 U/L  ALT 16  0 - 35 U/L   Alkaline Phosphatase 98  39 - 117 U/L   Total Bilirubin 0.3  0.3 - 1.2 mg/dL   GFR calc non Af Amer >90  >90 mL/min   GFR calc Af Amer >90  >90 mL/min  URINALYSIS, ROUTINE W REFLEX MICROSCOPIC      Result Value Range   Color, Urine YELLOW  YELLOW   APPearance  CLEAR  CLEAR   Specific Gravity, Urine 1.027  1.005 - 1.030   pH 6.0  5.0 - 8.0   Glucose, UA NEGATIVE  NEGATIVE mg/dL   Hgb urine dipstick NEGATIVE  NEGATIVE   Bilirubin Urine NEGATIVE  NEGATIVE   Ketones, ur NEGATIVE  NEGATIVE mg/dL   Protein, ur NEGATIVE  NEGATIVE mg/dL   Urobilinogen, UA 0.2  0.0 - 1.0 mg/dL   Nitrite NEGATIVE  NEGATIVE   Leukocytes, UA NEGATIVE  NEGATIVE  URINE RAPID DRUG SCREEN (HOSP PERFORMED)      Result Value Range   Opiates NONE DETECTED  NONE DETECTED   Cocaine NONE DETECTED  NONE DETECTED   Benzodiazepines POSITIVE (*) NONE DETECTED   Amphetamines NONE DETECTED  NONE DETECTED   Tetrahydrocannabinol NONE DETECTED  NONE DETECTED   Barbiturates POSITIVE (*) NONE DETECTED  ACETAMINOPHEN LEVEL      Result Value Range   Acetaminophen (Tylenol), Serum <15.0  10 - 30 ug/mL  SALICYLATE LEVEL      Result Value Range   Salicylate Lvl <2.0 (*) 2.8 - 20.0 mg/dL  ETHANOL      Result Value Range   Alcohol, Ethyl (B) <11  0 - 11 mg/dL   No results found.    EKG Interpretation   None       MDM  No diagnosis found.  Manic patient. Very depressed.  Recent death of family member.  Cuts herself.  Will check labs.  TTS consult.  Move to psych.  Medically clear.    Roxy Horseman, PA-C 12/12/12 1520

## 2012-12-12 NOTE — Progress Notes (Signed)
Writer informed the nurse working with the patient that has been accepted to [facility] Bed 400-1.

## 2012-12-12 NOTE — ED Notes (Signed)
Pt belongings are in locker 30 TCU.
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
## 2012-12-12 NOTE — ED Notes (Signed)
Pt has in belong bag: wooden picture frame of her father, silver plated bracelets, silver plated earrings, silver plated hair clips, black bra, black skirt, black t-shirt, black shoes.

## 2012-12-13 DIAGNOSIS — F319 Bipolar disorder, unspecified: Secondary | ICD-10-CM

## 2012-12-13 DIAGNOSIS — F411 Generalized anxiety disorder: Secondary | ICD-10-CM

## 2012-12-13 MED ORDER — IBUPROFEN 600 MG PO TABS
600.0000 mg | ORAL_TABLET | Freq: Four times a day (QID) | ORAL | Status: DC | PRN
  Administered 2012-12-13 – 2012-12-18 (×7): 600 mg via ORAL
  Filled 2012-12-13 (×7): qty 1
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
## 2012-12-13 NOTE — Tx Team (Signed)
  Interdisciplinary Treatment Plan Update   Date Reviewed:  12/13/2012  Time Reviewed:  8:18 AM  Progress in Treatment:   Attending groups: Yes Participating in groups: Yes Taking medication as prescribed: Yes  Tolerating medication: Yes Family/Significant other contact made: No  Patient understands diagnosis: No Limited insight.  Says she did not have a place to go since her breakup, but now she does Discussing patient identified problems/goals with staff: Yes See initial care plan Medical problems stabilized or resolved: Yes Denies suicidal/homicidal ideation: Yes  In tx team Patient has not harmed self or others: Yes  For review of initial/current patient goals, please see plan of care.  Estimated Length of Stay:  4-5 days  Reason for Continuation of Hospitalization: Anxiety Depression Medication stabilization  New Problems/Goals identified:  N/A  Discharge Plan or Barriers:   Go to the half way house of [place], follow up outpt  Additional Comments: Ms Brittany [name] is pacing, talking rapidly with very labile mood swings. She says the anniversary of her father's death in her arms and her daughter's birthday are both on Monday and she has worked herself into a state of anxiety, grief and happiness for her daughter all mixed together. He speech is pressured, circumstantial but still understandable. She says she has been diagnosed with Bipolar disorder but she believes she has depression and serious anxiety, She also has migraines and is having one even as she is talking to [place]. Her gestures are overly dramatic but heartfelt.    Attendees:  Signature: Thedore Mins, MD 12/13/2012 8:18 AM   Signature: Richelle Ito, LCSW 12/13/2012 8:18 AM  Signature: Fransisca Kaufmann, NP 12/13/2012 8:18 AM  Signature: Joslyn Devon, RN 12/13/2012 8:18 AM  Signature: Liborio Nixon, RN 12/13/2012 8:18 AM  Signature:  12/13/2012 8:18 AM  Signature:   12/13/2012 8:18 AM  Signature:    Signature:     Signature:    Signature:    Signature:    Signature:      Scribe for Treatment Team:   Richelle Ito, LCSW  12/13/2012 8:18 AM

## 2012-12-13 NOTE — Progress Notes (Signed)
[facility] Group Notes:  (Nursing/MHT/Case Management/Adjunct)  Date:  12/13/2012  Time:  8:00 pm  Type of Therapy:  Psychoeducational Skills  Participation Level:  Active  Participation Quality:  Attentive  Affect:  Depressed  Cognitive:  Disorganized  Insight:  Lacking  Engagement in Group:  Distracting  Modes of Intervention:  Education  Summary of Progress/Problems: The patient verbalized in group that she slept a great deal during the daytime. She indicated that her medication made her feel drowsy and ended up falling asleep in some of her groups. Her goal for tomorrow is to plan ahead for her discharge in which she will be sent to a "women's  Home" to work on her issues. As a theme for the day, her relapse prevention includes writing, listening to music, and taking her medication.   Hazle Coca S 12/13/2012, 9:24 PM

## 2012-12-13 NOTE — Progress Notes (Signed)
Patient at the window at the beginning of the shift demanding for medications, wound change and bandages. " I need all my prn, my nurses have been giving me all my medications". Writer asked patient what prn's she wants. Patient became agitated. Writer told patient to take some and come back for the other after an hour. Patient was angry; "they ordered them so I could have all of them". Writer tried explaining to patient but she insisted on taking all the medications. Writer gave some and asked patient to return if that did not work. Patient came back right after an hour and complain of more pain and requested for Ibuprofen and Tylenol. Q 15 minute check continues to maintain safety.

## 2012-12-13 NOTE — Progress Notes (Signed)
Adult Psychoeducational Group Note  Date:  12/13/2012 Time:  11:41 AM  Group Topic/Focus:  Relapse Prevention Planning:   The focus of this group is to define relapse and discuss the need for planning to combat relapse.  Participation Level:  Did Not Attend  Participation Quality:    Affect:    Cognitive:    Insight:   Engagement in Group:    Modes of Intervention:    Additional Comments:  Pt did not attend  Ximena Todaro M 12/13/2012, 11:41 AM

## 2012-12-13 NOTE — Progress Notes (Signed)
D:  Pt interaction was demanding, very focused on her rt leg cuts being dressed, explained to pt that writer would redress her rt leg once am meds were complete, pt came out of room 4 times asking for writer to change her rt leg dressing, pt entitled, anxious affect/mood, denies SI/HI/AVH.  A:  Emotional support provided, Encouraged pt to continue with treatment plan and attend all group activities, q15 min checks maintained for safety.  R:  Pt is anxious, not attending groups.
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
## 2012-12-14 DIAGNOSIS — F3130 Bipolar disorder, current episode depressed, mild or moderate severity, unspecified: Secondary | ICD-10-CM

## 2012-12-14 MED ORDER — CLONAZEPAM 1 MG PO TABS
1.0000 mg | ORAL_TABLET | Freq: Three times a day (TID) | ORAL | Status: DC | PRN
  Administered 2012-12-14 – 2012-12-16 (×7): 1 mg via ORAL
  Filled 2012-12-14 (×7): qty 1

## 2012-12-14 MED ORDER — ZIPRASIDONE HCL 40 MG PO CAPS
40.0000 mg | ORAL_CAPSULE | Freq: Every day | ORAL | Status: DC
  Administered 2012-12-14: 40 mg via ORAL
  Filled 2012-12-14 (×2): qty 1

## 2012-12-14 MED ORDER — BACITRACIN-NEOMYCIN-POLYMYXIN OINTMENT TUBE
TOPICAL_OINTMENT | Freq: Two times a day (BID) | CUTANEOUS | Status: DC
  Administered 2012-12-15: 08:00:00 via TOPICAL
  Administered 2012-12-15: 1 via TOPICAL
  Administered 2012-12-17 – 2012-12-18 (×2): via TOPICAL
  Filled 2012-12-14: qty 15

## 2012-12-14 NOTE — Progress Notes (Addendum)
[facility] MD Progress Note  12/14/2012 9:29 AM Brittany [name]  MRN:  [id number] Subjective:  I have a lot of depression. Objective Patient seen chart reviewed.  Patient this 47 year old Caucasian female who was admitted because of depression and anxiety symptoms.  She has multiple stressors.  She is facing the possibility of losing custody of her daughter who lives out of state.  The patient remains very labile, rambling and disorganized.  She continues to have difficulty organizing her thoughts.  She is inconsistent with her story .  Patient told that she is about to take out from her house because boyfriend is in like her anymore.  She has flight of ideas and she was a appear lose in the conversation.  In the past she had tried numerous psychotropic medication including antipsychotic medication however she stopped taking the medication because of side effects.  She is currently not seeing any psychiatrist.  She is getting her medications from [facility]. She is taking moderate dose of Xanax Neurontin and Cymbalta.  However she does not feel her current medicine is working.  Patient denies any paranoia or any hallucination but appears very disorganized.   Diagnosis:   DSM5:Axis I: Bipolar, Depressed  ADL's:  Intact  Sleep: Poor  Appetite:  Fair  Suicidal Ideation:  Plan:  No Intent:  No Means:  No Homicidal Ideation:  Plan:  No Intent:  No Means:  No AEB (as evidenced by):  Psychiatric Specialty Exam: Review of Systems  Constitutional: Positive for malaise/fatigue.  Musculoskeletal: Positive for joint pain.  Neurological: Positive for headaches.  Psychiatric/Behavioral: Positive for depression. The patient is nervous/anxious.     Blood pressure 123/83, pulse 79, temperature 97.2 F (36.2 C), temperature source Oral, resp. rate 16, height 5\' 1"  (1.549 m), weight 112.946 kg (249 lb).Body mass index is 47.07 kg/(m^2).  General Appearance: Casual  Eye Contact::  Poor  Speech:   Pressured  Volume:  Increased  Mood:  Depressed and Irritable  Affect:  Labile  Thought Process:  Circumstantial, Irrelevant and Loose  Orientation:  Full (Time, Place, and Person)  Thought Content:  Rumination  Suicidal Thoughts:  No  Homicidal Thoughts:  No  Memory:  Fair  Judgement:  Impaired  Insight:  Lacking  Psychomotor Activity:  Increased and Restlessness  Concentration:  Poor  Recall:  Fair  Akathisia:  No  Handed:  Right  AIMS (if indicated):     Assets:  Communication Skills Housing Social Support  Sleep:  Number of Hours: 5.25   Current Medications: Current Facility-Administered Medications  Medication Dose Route Frequency Provider Last Rate Last Dose  . acetaminophen (TYLENOL) tablet 650 mg  650 mg Oral Q6H PRN Shuvon Rankin, NP   650 mg at 12/13/12 2235  . ALPRAZolam Prudy Feeler) tablet 2 mg  2 mg Oral TID PRN Shuvon Rankin, NP   2 mg at 12/14/12 0653  . alum & mag hydroxide-simeth (MAALOX/MYLANTA) 200-200-20 MG/5ML suspension 30 mL  30 mL Oral Q4H PRN Shuvon Rankin, NP      . butalbital-acetaminophen-caffeine (FIORICET, ESGIC) 50-325-40 MG per tablet 2 tablet  2 tablet Oral BID PRN Shuvon Rankin, NP   2 tablet at 12/14/12 0653  . carisoprodol (SOMA) tablet 350 mg  350 mg Oral TID PRN Shuvon Rankin, NP   350 mg at 12/14/12 0755  . DULoxetine (CYMBALTA) DR capsule 60 mg  60 mg Oral Daily Shuvon Rankin, NP   60 mg at 12/14/12 0755  . gabapentin (NEURONTIN) capsule 800 mg  800 mg Oral TID Shuvon Rankin, NP   800 mg at 12/14/12 0755  . ibuprofen (ADVIL,MOTRIN) tablet 600 mg  600 mg Oral Q6H PRN Mojeed Akintayo   600 mg at 12/13/12 2235  . lamoTRIgine (LAMICTAL) tablet 200 mg  200 mg Oral BID Shuvon Rankin, NP   200 mg at 12/14/12 0755  . levothyroxine (SYNTHROID, LEVOTHROID) tablet 50 mcg  50 mcg Oral QAC breakfast Shuvon Rankin, NP   50 mcg at 12/14/12 0642  . magnesium hydroxide (MILK OF MAGNESIA) suspension 30 mL  30 mL Oral Daily PRN Shuvon Rankin, NP      .  multivitamin with minerals tablet 1 tablet  1 tablet Oral Daily Shuvon Rankin, NP   1 tablet at 12/14/12 0755  . zolpidem (AMBIEN) tablet 5 mg  5 mg Oral QHS PRN Mojeed Akintayo   5 mg at 12/12/12 2104    Lab Results: No results found for this or any previous visit (from the past 48 hour(s)).  Physical Findings: AIMS: Facial and Oral Movements Muscles of Facial Expression: None, normal Lips and Perioral Area: None, normal Jaw: None, normal Tongue: None, normal,Extremity Movements Upper (arms, wrists, hands, fingers): Moderate Lower (legs, knees, ankles, toes): Mild, Trunk Movements Neck, shoulders, hips: Minimal, Overall Severity Severity of abnormal movements (highest score from questions above): Moderate Incapacitation due to abnormal movements: None, normal Patient's awareness of abnormal movements (rate only patient's report): No Awareness, Dental Status Current problems with teeth and/or dentures?: No Does patient usually wear dentures?: No  CIWA:    COWS:     Treatment Plan Summary: Medication management The patient is taking moderate dose of Xanax, Cymbalta, gabapentin and Lamictal.  She continues to have labile mood and irritability.  I will discontinue the Xanax and try Klonopin 1 mg 3 times a day.  I would also add Geodon to help her mood lability.  Plan:  Medical Decision Making Problem Points:  Established problem, stable/improving (1), Established problem, worsening (2), New problem, with additional work-up planned (4), Review of last therapy session (1) and Review of psycho-social stressors (1) Data Points:  Decision to obtain old records (1) Review or order clinical lab tests (1) Review or order medicine tests (1) Review and summation of old records (2) Review of medication regiment & side effects (2) Review of new medications or change in dosage (2) 1  Admit for crisis management and stabilization.   Medication management to reduce symptoms to baseline and  improved the patient's overall level of functioning.  Closely monitor the side effects, efficacy and therapeutic response of medication.  Treat health problem as indicated.   Developed treatment plan to decrease the risk of relapse upon discharge and to reduce the need for readmission.   Psychosocial education regarding relapse prevention in self-care.   Healthcare followup as needed for medical problems and called consults as indicated.     Increase collateral information.   Restart home medication where appropriate. Encouraged to participate and verbalize into group milieu therapy.   I certify that inpatient services furnished can reasonably be expected to improve the patient's condition.   Lillion Elbert T. 12/14/2012, 9:29 AM
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
## 2012-12-14 NOTE — Progress Notes (Signed)
Patient continues to seek medications and demanding for everything she could get without actually telling writer what symptoms she had. "I need all my prn, the doctor ordered them for me, if I don't need them the doctor will not order this medications". She received Xanax and Fioricet this morning.Writer encouraged patient to use non pharmacological means to cope with the issues in her life. Patient stated that she had multiple issues and she is been using medications for years. Writer encouraged patient to allow those 2 medications to work and day RN will reassess her after breakfast.

## 2012-12-14 NOTE — Progress Notes (Signed)
The focus of this group is to help patients review their daily goal of treatment and discuss progress on daily workbooks. Pt attended the evening group session and responded to all discussion prompts from the Writer. Pt shared that today was a good day on account of her being able to talk to her daughter on the phone. Pt's daughter has a birthday this week and she opened presents while on the phone with Pt today. Pt reported in group that she did not receive a toiletry bag upon admission and was in need of these items, all of which were provided to her following group by the Writer. Pt shared that upon discharge she had a treatment plan of her own to implement that included keeping a schedule of positive activities and knowing her coping skills. Pt's affect was appropriate.

## 2012-12-15 MED ORDER — ZIPRASIDONE HCL 60 MG PO CAPS
60.0000 mg | ORAL_CAPSULE | Freq: Every day | ORAL | Status: DC
  Administered 2012-12-15 – 2012-12-17 (×3): 60 mg via ORAL
  Filled 2012-12-15 (×4): qty 1

## 2012-12-15 NOTE — Progress Notes (Signed)
Pt has been intrusive and somatic this evening. Asking about what meds she can have and requesting hs meds at 1945. States she is presently having a migraine of a 10/10. Speech is rapid, pressured and tangential. Pt talking about the significance of tomorrow's date. Eye contact is intense, affect and mood anxious. Pt given support and med education. Pt medicated per orders, ibuprofen given for her headache. On reassess she states the pain is only down to an 8/10. She denies SI/HI/AVH and remains safe. Lawrence Marseilles

## 2012-12-15 NOTE — [facility] Group Notes (Signed)
[facility] Group Notes:  (Nursing/MHT/Case Management/Adjunct)  Date:  12/15/2012  Time:  10:51 AM  Type of Therapy:  Psychoeducational Skills  Participation Level:  Did Not Attend  Participation Quality:    Affect:  Depressed  Cognitive:  Lacking  Insight:  None  Engagement in Group:  None  Modes of Intervention:  Support  Summary of Progress/Problems:pt slept and did not attend group  Rodman Key [facility] 12/15/2012, 10:51 AM

## 2012-12-15 NOTE — Progress Notes (Signed)
[facility] Group Notes:  (Nursing/MHT/Case Management/Adjunct)  Date:  12/15/2012  Time:  8:00p.m.   Type of Therapy:  Psychoeducational Skills  Participation Level:  Active  Participation Quality:  Appropriate  Affect:  Flat  Cognitive:  Appropriate  Insight:  Good  Engagement in Group:  Engaged  Modes of Intervention:  Education  Summary of Progress/Problems: The patient shared with the group that she devoted some of her time to writing poetry and reading. In addition, she mentioned that she spoke with her daughter over the telephone. As a theme for the day, she indicated that her support system consists of the [facility], [facility], and her counselor.   Hazle Coca S 12/15/2012, 8:54 PM

## 2012-12-15 NOTE — Progress Notes (Signed)
Pt continues to have med seeking behavior. Prior to nurse coming on shift pt did receive all the prn's she could get. She informed the nurse she was really tired and needed to go back to bed. Pt dressing removed off of right leg and neosporin applied. Pt instructed to leave the abrasions open to air for now.Pt is not very talkative this am. She does deny SI or HI and does contract for safety.Pt did tell the nurse she likes to be by herself as being around crowds makes her very nervous and anxious.

## 2012-12-15 NOTE — Progress Notes (Signed)
[facility] MD Progress Note  12/15/2012 9:00 AM Brittany [name]  MRN:  [id number] Subjective:  I like Geodon.   Objective Patient seen chart reviewed.  Brittany [name] continues to have medication seeking behavior.  She comes back multiple somatic complaint.  She is getting multiple psychotropic medication.  In the beginning she was not happy with the Geodon however she had a good night sleep.  She like to continue Geodon and even wondering if the dose can further increase.  She denies having any side effects.  I had explained that sometime antipsychotic medication helps her depression and patient acknowledged that.  She is also taking Klonopin 1 mg 3 times a day which was switched from Xanax 2 mg 3 times a day.  Patient does not have any withdrawal symptoms.  She does not have any side effects including any scratches which she believed in the past caused by Geodon.  Overall she is better from the yesterday however she remains very labile, rambling and preoccupied herself.  She continues to have difficulty organizing her thoughts.  She is inconsistent with her story .  She still had some flight of ideas and she was a appear lose in the conversation.  In the past she had tried numerous psychotropic medication including antipsychotic medication however she stopped taking the medication because of side effects.  She is currently not seeing any psychiatrist.  She was getting her medication from [facility].   Diagnosis:   DSM5:Axis I: Bipolar, Depressed  ADL's:  Intact  Sleep: Poor  Appetite:  Fair  Suicidal Ideation:  Plan:  No Intent:  No Means:  No Homicidal Ideation:  Plan:  No Intent:  No Means:  No AEB (as evidenced by):  Psychiatric Specialty Exam: Review of Systems  Constitutional: Positive for malaise/fatigue.  Musculoskeletal: Positive for joint pain.  Neurological: Positive for headaches.  Psychiatric/Behavioral: Positive for depression. The patient is nervous/anxious.     Blood  pressure 142/80, pulse 80, temperature 97.3 F (36.3 C), temperature source Oral, resp. rate 16, height 5\' 1"  (1.549 m), weight 112.946 kg (249 lb).Body mass index is 47.07 kg/(m^2).  General Appearance: Casual  Eye Contact::  Fair  Speech:  Pressured  Volume:  Increased  Mood:  Depressed and Irritable  Affect:  Labile  Thought Process:  Circumstantial, Irrelevant and Loose  Orientation:  Full (Time, Place, and Person)  Thought Content:  Rumination  Suicidal Thoughts:  No  Homicidal Thoughts:  No  Memory:  Fair  Judgement:  Impaired  Insight:  Lacking  Psychomotor Activity:  Increased and Restlessness  Concentration:  Poor  Recall:  Fair  Akathisia:  No  Handed:  Right  AIMS (if indicated):     Assets:  Communication Skills Housing Social Support  Sleep:  Number of Hours: 6.5   Current Medications: Current Facility-Administered Medications  Medication Dose Route Frequency Provider Last Rate Last Dose  . acetaminophen (TYLENOL) tablet 650 mg  650 mg Oral Q6H PRN Shuvon Rankin, NP   650 mg at 12/13/12 2235  . alum & mag hydroxide-simeth (MAALOX/MYLANTA) 200-200-20 MG/5ML suspension 30 mL  30 mL Oral Q4H PRN Shuvon Rankin, NP      . butalbital-acetaminophen-caffeine (FIORICET, ESGIC) 50-325-40 MG per tablet 2 tablet  2 tablet Oral BID PRN Shuvon Rankin, NP   2 tablet at 12/15/12 0646  . carisoprodol (SOMA) tablet 350 mg  350 mg Oral TID PRN Shuvon Rankin, NP   350 mg at 12/15/12 0646  . clonazePAM (KLONOPIN) tablet 1 mg  1 mg Oral TID PRN Cleotis Nipper, MD   1 mg at 12/15/12 0646  . DULoxetine (CYMBALTA) DR capsule 60 mg  60 mg Oral Daily Shuvon Rankin, NP   60 mg at 12/15/12 0752  . gabapentin (NEURONTIN) capsule 800 mg  800 mg Oral TID Shuvon Rankin, NP   800 mg at 12/15/12 0752  . ibuprofen (ADVIL,MOTRIN) tablet 600 mg  600 mg Oral Q6H PRN Mojeed Akintayo   600 mg at 12/14/12 1902  . lamoTRIgine (LAMICTAL) tablet 200 mg  200 mg Oral BID Shuvon Rankin, NP   200 mg at 12/15/12  0752  . levothyroxine (SYNTHROID, LEVOTHROID) tablet 50 mcg  50 mcg Oral QAC breakfast Shuvon Rankin, NP   50 mcg at 12/15/12 0646  . magnesium hydroxide (MILK OF MAGNESIA) suspension 30 mL  30 mL Oral Daily PRN Shuvon Rankin, NP      . multivitamin with minerals tablet 1 tablet  1 tablet Oral Daily Shuvon Rankin, NP   1 tablet at 12/15/12 0753  . neomycin-bacitracin-polymyxin (NEOSPORIN) ointment   Topical BID Nanine Means, NP      . ziprasidone (GEODON) capsule 60 mg  60 mg Oral QHS Cleotis Nipper, MD      . zolpidem (AMBIEN) tablet 5 mg  5 mg Oral QHS PRN Mojeed Akintayo   5 mg at 12/14/12 2121    Lab Results: No results found for this or any previous visit (from the past 48 hour(s)).  Physical Findings: AIMS: Facial and Oral Movements Muscles of Facial Expression: None, normal Lips and Perioral Area: None, normal Jaw: None, normal Tongue: None, normal,Extremity Movements Upper (arms, wrists, hands, fingers): Moderate Lower (legs, knees, ankles, toes): Mild, Trunk Movements Neck, shoulders, hips: Minimal, Overall Severity Severity of abnormal movements (highest score from questions above): Moderate Incapacitation due to abnormal movements: None, normal Patient's awareness of abnormal movements (rate only patient's report): No Awareness, Dental Status Current problems with teeth and/or dentures?: No Does patient usually wear dentures?: No  CIWA:    COWS:     Treatment Plan Summary: Medication management The patient is taking moderate dose of Xanax, Cymbalta, gabapentin and Lamictal.  She continues to have labile mood and irritability.  I will discontinue the Xanax and try Klonopin 1 mg 3 times a day.  I would also add Geodon to help her mood lability.  Plan:  Medical Decision Making Problem Points:  Established problem, stable/improving (1), Established problem, worsening (2), New problem, with additional work-up planned (4), Review of last therapy session (1) and Review of  psycho-social stressors (1) Data Points:  Decision to obtain old records (1) Review of medication regiment & side effects (2) Review of new medications or change in dosage (2)  I will increase her Geodon to 60 mg at bed time. Continue her current medications.  I certify that inpatient services furnished can reasonably be expected to improve the patient's condition.   ARFEEN,SYED T. 12/15/2012, 9:00 AM

## 2012-12-15 NOTE — [facility] Group Notes (Signed)
[facility] Group Notes: (Clinical Social Work)   12/15/2012      Type of Therapy:  Group Therapy   Participation Level:  Did Not Attend    Ambrose Mantle, LCSW 12/15/2012, 12:31 PM

## 2012-12-15 NOTE — Progress Notes (Signed)
Patient ID: Brittany [name], female   DOB: 1965/03/28, 47 y.o.   MRN: [id number]  Pt laying in bed resting with eyes closed. Respirations even and unlabored. No distress noted.

## 2012-12-16 MED ORDER — BUTALBITAL-APAP-CAFFEINE 50-325-40 MG PO TABS
2.0000 | ORAL_TABLET | Freq: Two times a day (BID) | ORAL | Status: DC | PRN
  Administered 2012-12-17 – 2012-12-18 (×3): 2 via ORAL
  Filled 2012-12-16 (×3): qty 2

## 2012-12-16 MED ORDER — CLONAZEPAM 1 MG PO TABS
1.0000 mg | ORAL_TABLET | Freq: Three times a day (TID) | ORAL | Status: DC | PRN
  Administered 2012-12-16 – 2012-12-18 (×4): 1 mg via ORAL
  Filled 2012-12-16 (×4): qty 1

## 2012-12-16 MED ORDER — CARISOPRODOL 350 MG PO TABS
350.0000 mg | ORAL_TABLET | Freq: Three times a day (TID) | ORAL | Status: DC | PRN
  Administered 2012-12-16 – 2012-12-18 (×5): 350 mg via ORAL
  Filled 2012-12-16 (×5): qty 1

## 2012-12-16 NOTE — ED Provider Notes (Signed)
Medical screening examination/treatment/procedure(s) were performed by non-physician practitioner and as supervising physician I was immediately available for consultation/collaboration.  EKG Interpretation   None         Dashana Guizar, MD 12/16/12 0703 

## 2012-12-16 NOTE — Progress Notes (Signed)
Patient ID: Brittany [name], female   DOB: 1965/07/02, 46 y.o.   MRN: [id number] D:Patient presents with anxious mood and sad affect.  She denies any HI/AVH.  Patient stated, "I don't feel safe here." when asked if she was having suicidal thoughts, she replied, "no, I just don't feel safe here at all."  Patient came to medication window and asked for her soma.  Patient returned less than 2 minutes and asked for her klonopin and fioricet.  She stated, "I having lower back pain.  I have spasms in my back.  I also need something for anxiety as this is the anniversary of my father's death.  It is also my daughter's birthday."  Patient was medicated and left to go lay down.  At 1200, patient returned to window requesting second doses of all of her prn medications.  Staff informed her that she was taking the medication too close together even though the order reads "3 times daily."  Patient stated, "I know what I can take and how to take it.  I want all of it now."  Patient stated, "it's easy for you to stand there.  You don't know what I've had to go through."  Patient was assured that she was not being judged and that there is "no magic pill for anxiety."  Patient had been observed in day room laughing and joking with her peers.  She exhibits med seeking behavior.  Her orders were reviewed with NP and changes were made to prns where patient cannot receive all her medications close together.  A:Continue to monitor medication management and MD orders.  Safety checks completed every 15 minutes per protocol.  R:Patient needs redirection at times and is cooperative.
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
## 2012-12-16 NOTE — [facility] Group Notes (Signed)
[facility] LCSW Aftercare Discharge Planning Group Note   12/16/2012 9:36 AM  Participation Quality:  Did not attend    [place]

## 2012-12-16 NOTE — Progress Notes (Signed)
Adult Psychoeducational Group Note  Date:  12/16/2012 Time:  9:24 PM  Group Topic/Focus:  Wrap-Up Group:   The focus of this group is to help patients review their daily goal of treatment and discuss progress on daily workbooks.  Participation Level:  Active  Participation Quality:  Appropriate  Affect:  Anxious  Cognitive:  Lacking  Insight: Improving  Engagement in Group:  Engaged  Modes of Intervention:  Support  Additional Comments:  Patient attended and participated in group tonight. She reports having an "OK" day. She is trying to get her medications right. Today is her daughter's birthday and the first year of her father's death. She attended group, talk to her social worker and talked with her daughter. For her wellness she would like to get involved with the [facility] and the [facility]. She will take her medication and exercise.  Brittany [name] [facility] 12/16/2012, 9:24 PM

## 2012-12-16 NOTE — Progress Notes (Signed)
Recreation Therapy Notes  Date: 11.03.2014 Time: 9:30am Location: [address] Dayroom  Group Topic: Wellness  Goal Area(s) Addresses:  Patient will define components of whole wellness. Patient will verbalize benefit of whole wellness.  Behavioral Response: Did not attend  Jearl Klinefelter, LRT/CTRS  Jearl Klinefelter 12/16/2012 12:59 PM

## 2012-12-16 NOTE — [facility] Group Notes (Signed)
[facility] LCSW Group Therapy  12/16/2012 1:15 pm  Type of Therapy: Process Group Therapy  Participation Level:  Active  Participation Quality:  Appropriate  Affect:  Flat  Cognitive:  Oriented  Insight:  Improving  Engagement in Group:  Limited  Engagement in Therapy:  Limited  Modes of Intervention:  Activity, Clarification, Education, Problem-solving and Support  Summary of Progress/Problems: Today's group addressed the issue of overcoming obstacles.  Patients were asked to identify their biggest obstacle post d/c that stands in the way of their on-going success, and then problem solve as to how to manage this.  Brittany [name] joined [place] in group about half way through.  She stated her goal was to feel safe, and related this to her current situation with her children and not being able to see them.  She then veered off  to talk about her follow up plan.  I explained we needed a psychiatrist for her to follow up with.  She began leafing through the phone book, and was unable to refocus to the topic in group.  She left and did not return.  Daryel Gerald B 12/16/2012   1:24 PM

## 2012-12-16 NOTE — Progress Notes (Signed)
Patient ID: Brittany [name], female   DOB: July 19, 1965, 47 y.o.   MRN: [id number]  [facility] MD Progress Note  12/16/2012 12:22 PM Brittany [name]  MRN:  [id number] Subjective:  Patient states "I'm having a tough time because my Dad died a year ago today. I talked to the Chaplain and that was helpful. I just really want to make sure my medications are right after I leave. Otherwise I will have a panic attack out in public and they will just bring me back here. I need to have something else increased before I leave here."  Objective: Patient focused on getting prescriptions for all her medications upon discharge including klonopin and soma. Nursing reports indicate that the patient has been extremely medication seeking and attempting to control the times that she gets her prn medications. Patient informed that the times were changed to keep them more spaced out. She handled this well but remains focused on medications.   Diagnosis:   DSM5:Axis I: Bipolar, Depressed  ADL's:  Intact  Sleep: Poor  Appetite:  Fair  Suicidal Ideation:  Plan:  No Intent:  No Means:  No Homicidal Ideation:  Plan:  No Intent:  No Means:  No AEB (as evidenced by):  Psychiatric Specialty Exam: Review of Systems  Constitutional: Positive for malaise/fatigue.  Eyes: Negative.   Respiratory: Negative.   Cardiovascular: Negative.   Gastrointestinal: Negative.   Genitourinary: Negative.   Musculoskeletal: Positive for joint pain.  Skin: Negative.   Neurological: Negative.   Endo/Heme/Allergies: Negative.   Psychiatric/Behavioral: Positive for depression and substance abuse. Negative for suicidal ideas, hallucinations and memory loss. The patient is nervous/anxious. The patient does not have insomnia.     Blood pressure 165/95, pulse 83, temperature 97.4 F (36.3 C), temperature source Oral, resp. rate 16, height 5\' 1"  (1.549 m), weight 112.946 kg (249 lb).Body mass index is 47.07 kg/(m^2).  General Appearance:  Casual  Eye Contact::  Fair  Speech:  Pressured  Volume:  Increased  Mood:  Depressed and Irritable  Affect:  Labile  Thought Process:  Circumstantial, Irrelevant and Loose  Orientation:  Full (Time, Place, and Person)  Thought Content:  Rumination  Suicidal Thoughts:  No  Homicidal Thoughts:  No  Memory:  Fair  Judgement:  Impaired  Insight:  Lacking  Psychomotor Activity:  Increased and Restlessness  Concentration:  Poor  Recall:  Fair  Akathisia:  No  Handed:  Right  AIMS (if indicated):     Assets:  Communication Skills Housing Social Support  Sleep:  Number of Hours: 5.5   Current Medications: Current Facility-Administered Medications  Medication Dose Route Frequency Provider Last Rate Last Dose  . acetaminophen (TYLENOL) tablet 650 mg  650 mg Oral Q6H PRN Shuvon Rankin, NP   650 mg at 12/13/12 2235  . alum & mag hydroxide-simeth (MAALOX/MYLANTA) 200-200-20 MG/5ML suspension 30 mL  30 mL Oral Q4H PRN Shuvon Rankin, NP      . butalbital-acetaminophen-caffeine (FIORICET, ESGIC) 50-325-40 MG per tablet 2 tablet  2 tablet Oral Q12H PRN Fransisca Kaufmann, NP      . carisoprodol (SOMA) tablet 350 mg  350 mg Oral Q8H PRN Fransisca Kaufmann, NP      . clonazePAM Scarlette Calico) tablet 1 mg  1 mg Oral Q8H PRN Fransisca Kaufmann, NP      . DULoxetine (CYMBALTA) DR capsule 60 mg  60 mg Oral Daily Shuvon Rankin, NP   60 mg at 12/16/12 7829  . gabapentin (NEURONTIN) capsule 800 mg  800 mg Oral  TID Shuvon Rankin, NP   800 mg at 12/16/12 1153  . ibuprofen (ADVIL,MOTRIN) tablet 600 mg  600 mg Oral Q6H PRN Mojeed Akintayo   600 mg at 12/15/12 1955  . lamoTRIgine (LAMICTAL) tablet 200 mg  200 mg Oral BID Shuvon Rankin, NP   200 mg at 12/16/12 0823  . levothyroxine (SYNTHROID, LEVOTHROID) tablet 50 mcg  50 mcg Oral QAC breakfast Shuvon Rankin, NP   50 mcg at 12/16/12 1610  . magnesium hydroxide (MILK OF MAGNESIA) suspension 30 mL  30 mL Oral Daily PRN Shuvon Rankin, NP      . multivitamin with minerals tablet 1  tablet  1 tablet Oral Daily Shuvon Rankin, NP   1 tablet at 12/16/12 9604  . neomycin-bacitracin-polymyxin (NEOSPORIN) ointment   Topical BID Nanine Means, NP   1 application at 12/15/12 1353  . ziprasidone (GEODON) capsule 60 mg  60 mg Oral QHS Cleotis Nipper, MD   60 mg at 12/15/12 2117  . zolpidem (AMBIEN) tablet 5 mg  5 mg Oral QHS PRN Mojeed Akintayo   5 mg at 12/15/12 2117    Lab Results: No results found for this or any previous visit (from the past 48 hour(s)).  Physical Findings: AIMS: Facial and Oral Movements Muscles of Facial Expression: None, normal Lips and Perioral Area: None, normal Jaw: None, normal Tongue: None, normal,Extremity Movements Upper (arms, wrists, hands, fingers): Moderate Lower (legs, knees, ankles, toes): Mild, Trunk Movements Neck, shoulders, hips: Minimal, Overall Severity Severity of abnormal movements (highest score from questions above): Moderate Incapacitation due to abnormal movements: None, normal Patient's awareness of abnormal movements (rate only patient's report): No Awareness, Dental Status Current problems with teeth and/or dentures?: No Does patient usually wear dentures?: No  CIWA:    COWS:     Treatment Plan Summary: Medication management  Plan: Continue crisis management and stabilization.  Medication management: Reviewed medication with patient who stated no untoward effects. Continue Klonopin, Neurontin, Cymbalta, Lamictal, and Geodon at current dosages. Change Klonopin and Soma to every eight hours prn. Change Fioricet to two tablets every twelve hours prn headache.  Encouraged patient to attend groups and participate in group counseling sessions and activities.  Discharge plan in progress. Anticipate d/c tomorrow.  Continue current treatment plan.    Medical Decision Making Problem Points:  Established problem, stable/improving (1), Established problem, worsening (2), New problem, with additional work-up planned (4), Review of  last therapy session (1) and Review of psycho-social stressors (1) Data Points:  Decision to obtain old records (1) Review of medication regiment & side effects (2) Review of new medications or change in dosage (2)  I will increase her Geodon to 60 mg at bed time. Continue her current medications.  I certify that inpatient services furnished can reasonably be expected to improve the patient's condition.   Nola Botkins NP-C 12/16/2012, 12:22 PM

## 2012-12-17 DIAGNOSIS — F411 Generalized anxiety disorder: Secondary | ICD-10-CM

## 2012-12-17 MED ORDER — DOXEPIN HCL 50 MG PO CAPS
50.0000 mg | ORAL_CAPSULE | Freq: Every day | ORAL | Status: DC
  Administered 2012-12-17: 50 mg via ORAL
  Filled 2012-12-17 (×2): qty 1

## 2012-12-17 MED ORDER — GABAPENTIN 300 MG PO CAPS
600.0000 mg | ORAL_CAPSULE | Freq: Three times a day (TID) | ORAL | Status: DC
  Administered 2012-12-17 – 2012-12-18 (×4): 600 mg via ORAL
  Filled 2012-12-17 (×7): qty 2

## 2012-12-17 NOTE — Progress Notes (Signed)
Patient ID: Brittany [name], female   DOB: Nov 06, 1965, 47 y.o.   MRN: [id number] Patient started acting out on the unit. She went to [facility] and started yelling that someone had stolen her book of poems.  Patient was also upset because it was 1455 and this nurse was eating lunch.  Patient screamed, "I guess this is what you have to do to get any medication around here!!"  Patient has been med seeking throughout the day, requesting meds when they are not due.  This nurse came into day room to assist patient and she screamed, "you don't know what it's like to have your dad die!  You don't know what it's like to have a child taken from you.  You don't know anything about me."  I assured that patient that we would find her book and what could I do to help.  Patient was advised she could have her dose of klonopin now that it was due.  Patient stormed up to medication window and proceeded to berate staff and accuse the nursing students of stealing her book of poems.  [facility] went into patient's room and found the book on her bed.  When advising the patient of this, she proceeded to shake her head stating, "there is something wrong with me!!  There is something wrong with me."  Patient was given her medication and she proceeded to day room to color.  Patient has a long hx of substance abuse and continues to med seek while here.

## 2012-12-17 NOTE — Progress Notes (Signed)
Adult Psychoeducational Group Note  Date:  12/17/2012 Time:  8:43 PM  Group Topic/Focus:  Wrap-Up Group:   The focus of this group is to help patients review their daily goal of treatment and discuss progress on daily workbooks.  Participation Level:  Active  Participation Quality:  Appropriate  Affect:  Appropriate  Cognitive:  Appropriate  Insight: Appropriate  Engagement in Group:  Engaged  Modes of Intervention:  Discussion  Additional Comments: The patient explained that her day was manic that she could not get things together.But overall she was able to get her thoughts together and have a good day.  Octavio Manns 12/17/2012, 8:43 PM

## 2012-12-17 NOTE — [facility] Suicide Risk Assessment (Signed)
[facility] INPATIENT:  Family/Significant Other Suicide Prevention Education  Suicide Prevention Education:  Education Completed; No one has been identified by the patient as the family member/significant other with whom the patient will be residing, and identified as the person(s) who will aid the patient in the event of a mental health crisis (suicidal ideations/suicide attempt).  With written consent from the patient, the family member/significant other has been provided the following suicide prevention education, prior to the and/or following the discharge of the patient.  The suicide prevention education provided includes the following:  Suicide risk factors  Suicide prevention and interventions  National Suicide Hotline telephone number  [facility] assessment telephone number  [facility] Emergency Assistance 911  [facility] and/or Residential Mobile Crisis Unit telephone number  Request made of family/significant other to:  Remove weapons (e.g., guns, rifles, knives), all items previously/currently identified as safety concern.    Remove drugs/medications (over-the-counter, prescriptions, illicit drugs), all items previously/currently identified as a safety concern.  The family member/significant other verbalizes understanding of the suicide prevention education information provided.  The family member/significant other agrees to remove the items of safety concern listed above.  The patient did not endorse SI at the time of admission, nor did the patient c/o SI during the stay here.  SPE not required.   Daryel Gerald B 12/17/2012, 8:22 AM

## 2012-12-17 NOTE — Progress Notes (Signed)
Patient ID: Stanton Kidney, female   DOB: [birth year], 47 y.o.   MRN: [id number]  [facility] MD Progress Note  12/17/2012 10:40 AM Deborahann Poteat  MRN:  [id number] Subjective:  "I am still having difficulty sleeping, I need my sleep medication changed and my Neurontin reduced." Objective: Patient reports difficulty sleeping, feeling anxious with occasional agitation and mood lability. Patient is extremely difficult to satisfy, she wants to dictate what medications she want and which one she does not. She is focused on getting prescriptions for all her medications upon discharge including klonopin and soma otherwise "my life is going to be a wreck and I will come back here." She has trouble getting along with people due to her attitude towards them. She denies any adverse reactions to her medications. Diagnosis:   DSM5:Axis I: Bipolar 1 disorder recent epiosde Depressed                       Generalized anxiety disorder                       Cluster B trait.  ADL's:  Intact  Sleep: fair  Appetite:  Fair  Suicidal Ideation:  Plan:  No Intent:  No Means:  No Homicidal Ideation:  Plan:  No Intent:  No Means:  No AEB (as evidenced by):  Psychiatric Specialty Exam: Review of Systems  Constitutional: Positive for malaise/fatigue.  Eyes: Negative.   Respiratory: Negative.   Cardiovascular: Negative.   Gastrointestinal: Negative.   Genitourinary: Negative.   Musculoskeletal: Positive for joint pain.  Skin: Negative.   Neurological: Negative.   Endo/Heme/Allergies: Negative.   Psychiatric/Behavioral: Negative for suicidal ideas, hallucinations and memory loss. The patient is nervous/anxious. The patient does not have insomnia.     Blood pressure 134/86, pulse 88, temperature 98.2 F (36.8 C), temperature source Oral, resp. rate 20, height 5\' 1"  (1.549 m), weight 112.946 kg (249 lb).Body mass index is 47.07 kg/(m^2).  General Appearance: Casual  Eye Contact::  Fair  Speech:  Pressured   Volume:  Increased  Mood:  Depressed and Irritable  Affect:  Labile  Thought Process:  Circumstantial, Irrelevant and Loose  Orientation:  Full (Time, Place, and Person)  Thought Content:  Rumination  Suicidal Thoughts:  No  Homicidal Thoughts:  No  Memory:  Fair  Judgement:  Impaired  Insight:  Lacking  Psychomotor Activity:  Increased and Restlessness  Concentration:  Poor  Recall:  Fair  Akathisia:  No  Handed:  Right  AIMS (if indicated):     Assets:  Communication Skills Housing Social Support  Sleep:  Number of Hours: 4.5   Current Medications: Current Facility-Administered Medications  Medication Dose Route Frequency Provider Last Rate Last Dose  . acetaminophen (TYLENOL) tablet 650 mg  650 mg Oral Q6H PRN Shuvon Rankin, NP   650 mg at 12/13/12 2235  . alum & mag hydroxide-simeth (MAALOX/MYLANTA) 200-200-20 MG/5ML suspension 30 mL  30 mL Oral Q4H PRN Shuvon Rankin, NP      . butalbital-acetaminophen-caffeine (FIORICET, ESGIC) 50-325-40 MG per tablet 2 tablet  2 tablet Oral Q12H PRN Fransisca Kaufmann, NP   2 tablet at 12/17/12 0649  . carisoprodol (SOMA) tablet 350 mg  350 mg Oral Q8H PRN Fransisca Kaufmann, NP   350 mg at 12/17/12 0649  . clonazePAM (KLONOPIN) tablet 1 mg  1 mg Oral Q8H PRN Fransisca Kaufmann, NP   1 mg at 12/17/12 0649  . doxepin (SINEQUAN) capsule 50 mg  50 mg Oral QHS Nelma Phagan      . DULoxetine (CYMBALTA) DR capsule 60 mg  60 mg Oral Daily Shuvon Rankin, NP   60 mg at 12/17/12 0740  . gabapentin (NEURONTIN) capsule 600 mg  600 mg Oral TID Crytal Pensinger      . ibuprofen (ADVIL,MOTRIN) tablet 600 mg  600 mg Oral Q6H PRN Stephone Gum   600 mg at 12/16/12 1645  . lamoTRIgine (LAMICTAL) tablet 200 mg  200 mg Oral BID Shuvon Rankin, NP   200 mg at 12/17/12 0740  . levothyroxine (SYNTHROID, LEVOTHROID) tablet 50 mcg  50 mcg Oral QAC breakfast Shuvon Rankin, NP   50 mcg at 12/17/12 0649  . magnesium hydroxide (MILK OF MAGNESIA) suspension 30 mL  30 mL Oral Daily PRN  Shuvon Rankin, NP      . multivitamin with minerals tablet 1 tablet  1 tablet Oral Daily Shuvon Rankin, NP   1 tablet at 12/17/12 0740  . neomycin-bacitracin-polymyxin (NEOSPORIN) ointment   Topical BID Nanine Means, NP   1 application at 12/15/12 1353  . ziprasidone (GEODON) capsule 60 mg  60 mg Oral QHS Cleotis Nipper, MD   60 mg at 12/16/12 2131    Lab Results: No results found for this or any previous visit (from the past 48 hour(s)).  Physical Findings: AIMS: Facial and Oral Movements Muscles of Facial Expression: None, normal Lips and Perioral Area: None, normal Jaw: None, normal Tongue: None, normal,Extremity Movements Upper (arms, wrists, hands, fingers): None, normal Lower (legs, knees, ankles, toes): None, normal, Trunk Movements Neck, shoulders, hips: None, normal, Overall Severity Severity of abnormal movements (highest score from questions above): None, normal Incapacitation due to abnormal movements: None, normal Patient's awareness of abnormal movements (rate only patient's report): No Awareness, Dental Status Current problems with teeth and/or dentures?: No Does patient usually wear dentures?: No  CIWA:    COWS:     Treatment Plan Summary: Medication management  Plan: Continue crisis management and stabilization.  Medication management: Reviewed medication with patient who stated no untoward effects. Continue Klonopin, Neurontin, Cymbalta, Lamictal, and Geodon at current dosages. Change Klonopin and Soma to every eight hours prn. Change Fioricet to two tablets every twelve hours prn headache.  Encouraged patient to attend groups and participate in group counseling sessions and activities.  Discharge plan in progress. Anticipate d/c tomorrow.  Continue current treatment plan.  Add Doxepin 50mg  po daily at bedtime for Insomnia.   Medical Decision Making Problem Points:  Established problem, stable/improving (1), Established problem, worsening (2), New problem, with  additional work-up planned (4), Review of last therapy session (1) and Review of psycho-social stressors (1) Data Points:  Decision to obtain old records (1) Review of medication regiment & side effects (2) Review of new medications or change in dosage (2)  I certify that inpatient services furnished can reasonably be expected to improve the patient's condition.   Thedore Mins, MD 12/17/2012, 10:40 AM

## 2012-12-17 NOTE — Tx Team (Signed)
  Interdisciplinary Treatment Plan Update   Date Reviewed:  12/17/2012  Time Reviewed:  10:40 AM  Progress in Treatment:   Attending groups: Yes Participating in groups: Yes Taking medication as prescribed: Yes  Tolerating medication: Yes Family/Significant other contact made: Yes  Patient understands diagnosis: Yes  Discussing patient identified problems/goals with staff: Yes Medical problems stabilized or resolved: Yes Denies suicidal/homicidal ideation: Yes  In tx team Patient has not harmed self or others: Yes  For review of initial/current patient goals, please see plan of care.  Estimated Length of Stay:  Likely d/c tomorrow  Reason for Continuation of Hospitalization: Anxiety Medication stabilization  New Problems/Goals identified:  N/A  Discharge Plan or Barriers:   plans to go to a half way house, follow up at [facility]  Additional Comments:  Asked for a total revamping of her meds today to address anxiety.  Dr was willing to make some dosage changes, but pointed out she is leaving tomorrow, and this should have been discussed yesterday at the latest.  She accepted this.  Demonstrates med seeking behaviors, particularly for pain.  Attendees:  Signature: Thedore Mins, MD 12/17/2012 10:40 AM   Signature: Richelle Ito, LCSW 12/17/2012 10:40 AM  Signature: Fransisca Kaufmann, NP 12/17/2012 10:40 AM  Signature: Joslyn Devon, RN 12/17/2012 10:40 AM  Signature: Liborio Nixon, RN 12/17/2012 10:40 AM  Signature:  12/17/2012 10:40 AM  Signature:   12/17/2012 10:40 AM  Signature:    Signature:    Signature:    Signature:    Signature:    Signature:      Scribe for Treatment Team:   Richelle Ito, LCSW  12/17/2012 10:40 AM

## 2012-12-17 NOTE — Progress Notes (Signed)
Patient ID: Brittany [name], female   DOB: 23-Jul-1965, 47 y.o.   MRN: [id number]  D: Pt was very intrusive, always coming up when the writer is with another pt.  Continued to ask about her meds and what she would "normally" take.  Writer informed pt of meds and had been given and what was left depending on her symptoms.  A:  Support and encouragement was offered. 15 min checks continued for safety.  R: Pt remains safe.

## 2012-12-17 NOTE — Progress Notes (Signed)
Patient ID: Brittany [name], female   DOB: Dec 21, 1965, 47 y.o.   MRN: [id number] D:Patient met with MD and nurse for treatment team.  Patient stated, "I feel deeply sad.  I've been sad for a long time."  Patient's main concern this am was her medication regiment.  Patient asked for supply of her klonopin until she can get a prescription.  Patient had difficulty following psychiatrist's conversation and would talk over staff.  She was asked to listen and focus on what the doctor was telling her.  Patient continues with her med seeking behavior.  She received her Soma at 1200 and then requested all the times that she could have her klonopin and fioricet.  Her main focus during the day is times that she can receive her PRNs. Patient has poor insight as to her medication usage. Patient is a possible discharge tomorrow.  She denies any SI/HI/AVH. A:Continue to monitor medication management and MD orders.  R:Safety checks completed every 15 minutes per protocol.  Redirect patient as necessary.

## 2012-12-17 NOTE — [facility] Group Notes (Signed)
[facility] LCSW Group Therapy  12/17/2012 , 10:40 AM   Type of Therapy:  Group Therapy  Participation Level:  Minimal  Participation Quality:  Distracted  Affect:  Appropriate  Cognitive:  Alert  Insight:  Improving  Engagement in Therapy:  Engaged  Modes of Intervention:  Discussion, Exploration and Socialization  Summary of Progress/Problems: Today's group focused on the term Diagnosis.  Participants were asked to define the term, and then pronounce whether it is a negative, positive or neutral term.  Brittany [name] appeared to be using group time to organize and plan for her future.  She had a notebook and was busy making lists and things to do.  When questioned directly, she was able to participate, and was paying enough attention to join where the current conversation was, but did not engage on her own.    Brittany [name] 12/17/2012 , 10:40 AM

## 2012-12-17 NOTE — Progress Notes (Signed)
Patient ID: Brittany [name], female   DOB: 21-Jan-1966, 47 y.o.   MRN: [id number] Pt visible in the milieu.  Interacting appropriately with staff and peers. Pt stated her daughter's birthday was yesterday as well as the anniversary of her father's death.  Pt also discussed the fact that she is supposed to get married but not sure if that is going to happen.  Pt stated she was appreciative of the support she is receiving.  Fifteen minute checks in progress. Pt safe on unit.

## 2012-12-18 DIAGNOSIS — F3160 Bipolar disorder, current episode mixed, unspecified: Principal | ICD-10-CM

## 2012-12-18 DIAGNOSIS — F1320 Sedative, hypnotic or anxiolytic dependence, uncomplicated: Secondary | ICD-10-CM

## 2012-12-18 MED ORDER — VITAMIN D-3 125 MCG (5000 UT) PO TABS: 1.0000 | ORAL_TABLET | Freq: Every day | ORAL | Status: DC

## 2012-12-18 MED ORDER — LAMOTRIGINE 200 MG PO TABS: 200.0000 mg | ORAL_TABLET | Freq: Two times a day (BID) | ORAL | Status: DC

## 2012-12-18 MED ORDER — ADULT MULTIVITAMIN W/MINERALS CH: 1.0000 | ORAL_TABLET | Freq: Every day | ORAL | Status: DC

## 2012-12-18 MED ORDER — GABAPENTIN 300 MG PO CAPS: 600.0000 mg | ORAL_CAPSULE | Freq: Three times a day (TID) | ORAL | Status: DC

## 2012-12-18 MED ORDER — BUTALBITAL-APAP-CAFFEINE 50-325-40 MG PO TABS: 2.0000 | ORAL_TABLET | Freq: Two times a day (BID) | ORAL | Status: DC

## 2012-12-18 MED ORDER — ZIPRASIDONE HCL 60 MG PO CAPS: 60.0000 mg | ORAL_CAPSULE | Freq: Every day | ORAL | Status: DC

## 2012-12-18 MED ORDER — DOXEPIN HCL 50 MG PO CAPS: 50.0000 mg | ORAL_CAPSULE | Freq: Every day | ORAL | Status: DC

## 2012-12-18 MED ORDER — LEVOTHYROXINE SODIUM 50 MCG PO TABS: 50.0000 ug | ORAL_TABLET | Freq: Every day | ORAL | Status: DC

## 2012-12-18 MED ORDER — DULOXETINE HCL 60 MG PO CPEP: 60.0000 mg | ORAL_CAPSULE | Freq: Every day | ORAL | Status: DC

## 2012-12-18 MED ORDER — CLONAZEPAM 0.5 MG PO TABS
0.5000 mg | ORAL_TABLET | Freq: Two times a day (BID) | ORAL | Status: DC | PRN
  Administered 2012-12-18: 0.5 mg via ORAL
  Filled 2012-12-18: qty 1

## 2012-12-18 NOTE — Progress Notes (Signed)
Patient ID: Brittany [name], female   DOB: 05/07/1965, 47 y.o.   MRN: [id number]  Pt laying in bed resting with eyes closed. Respirations even and unlabored. No distress noted.   

## 2012-12-18 NOTE — Progress Notes (Signed)
Patient ID: Brittany [name], female   DOB: [birth year], 47 y.o.   MRN: [id number] Patient discharged per physician order; Patient denies SI/HI and A/V hallucinations; patient received samples, prescriptions, and copy of AVS after it was reviewed; patient had no other questions or concerns at this time; patient verbalized and signed that she received all belongings; patient left the unit ambulatory

## 2012-12-18 NOTE — [facility] Suicide Risk Assessment (Signed)
Suicide Risk Assessment  Discharge Assessment     Demographic Factors:  Caucasian, Low socioeconomic status, Unemployed and female  Mental Status Per Nursing Assessment::   On Admission:  NA  Current Mental Status by Physician: patient denies suicidal ideation, intent or plan  Loss Factors: Financial problems/change in socioeconomic status  Historical Factors: Impulsivity  Risk Reduction Factors:   Living with another person, especially a relative and Positive social support  Continued Clinical Symptoms:  Alcohol/Substance Abuse/Dependencies  Cognitive Features That Contribute To Risk:  Closed-mindedness Polarized thinking    Suicide Risk:  Minimal: No identifiable suicidal ideation.  Patients presenting with no risk factors but with morbid ruminations; may be classified as minimal risk based on the severity of the depressive symptoms  Discharge Diagnoses:   AXIS I:  Bipolar 1 disorder recent episode mixed               Benzodiazepine use disorder severe AXIS II:  Deferred AXIS III:   Past Medical History  Diagnosis Date  . Hypertension   . Back pain   . Bulging disc   . Ruptured intervertebral disc   . Migraine   . Head injury   . Short-term memory loss   . Depression   . Hypothyroidism   . Goiter    AXIS IV:  economic problems, other psychosocial or environmental problems and problems related to social environment AXIS V:  61-70 mild symptoms  Plan Of Care/Follow-up recommendations:  Activity:  as tolerated Diet:  healthy Tests:  routine Other:  patient to keep her after care appointment  Is patient on multiple antipsychotic therapies at discharge:  No   Has Patient had three or more failed trials of antipsychotic monotherapy by history:  No  Recommended Plan for Multiple Antipsychotic Therapies: NA  Sigmund Morera,MD 12/18/2012, 9:50 AM

## 2012-12-18 NOTE — Progress Notes (Signed)
Recreation Therapy Notes  Date: 11.05.2014 Time: 9:30am Location: [address] Dayroom  Group Topic: Communication  Goal Area(s) Addresses:  Patient will effectively communicate with peers in group.  Patient will verbalize benefit of healthy communication. Patient will verbalize positive effect of healthy communication on post d/c goals.   Behavioral Response: Engaged in activity, Disengaged in discussion  Intervention: Game  Activity: Random Words. Patients were asked to select a word from the provided container. Using descriptors and/or actions patients were tasked with getting group members to guess the selected word.     Education: Communication, Discharge Planning   Education Outcome: Acknowledges understanding  Clinical Observations/Feedback: Patient actively engaged in group activity. Patient chose to act out selected words versus describing them. While patient was not actively engaged in activity she was observed to color a coloring sheet. Patient made no contributions to group discussion and it is unclear if she was actively listening, as she continued to color coloring sheet during group discussion.   Marykay Lex Olimpia Tinch, LRT/CTRS  Robbin Loughmiller L 12/18/2012 1:30 PM

## 2012-12-18 NOTE — Progress Notes (Signed)
Seen and agreed. Jimel Myler, MD 

## 2012-12-18 NOTE — Discharge Summary (Signed)
Physician Discharge Summary Note  Patient:  Brittany [name] is an 47 y.o., female MRN:  [id number] DOB:  1965/05/29 Patient phone:  [phone] (home)  Patient address:   [address] [place] [address],   Date of Admission:  12/12/2012 Date of Discharge: 12/18/12  Reason for Admission:  Bipolar Depression  Discharge Diagnoses: Principal Problem:   Bipolar affective Active Problems:   Anxiety disorder  Review of Systems  Constitutional: Negative.   HENT: Negative.   Eyes: Negative.   Respiratory: Negative.   Cardiovascular: Negative.   Gastrointestinal: Negative.   Genitourinary: Negative.   Musculoskeletal: Positive for myalgias and neck pain.  Skin: Negative.   Neurological: Negative.   Endo/Heme/Allergies: Negative.   Psychiatric/Behavioral: Positive for depression and substance abuse. Negative for suicidal ideas, hallucinations and memory loss. The patient is nervous/anxious.     DSM5: AXIS I: Bipolar 1 disorder recent episode mixed  Benzodiazepine use disorder severe  AXIS II: Deferred  AXIS III:  Past Medical History   Diagnosis  Date   .  Hypertension    .  Back pain    .  Bulging disc    .  Ruptured intervertebral disc    .  Migraine    .  Head injury    .  Short-term memory loss    .  Depression    .  Hypothyroidism    .  Goiter    AXIS IV: economic problems, other psychosocial or environmental problems and problems related to social environment  AXIS V: 61-70 mild symptoms  Level of Care:  OP  Hospital Course:   This is a 47 year old female who presented to [facility] complaining of increased depression and anxiety. The patient goes into detail about numerous stressors including possibility of losing custody of her daughter who lives out of state with family. She is very focused on this being her main stressors. Patient also talks about finding out that her boyfriend doe not want to marry her and kicked her out of his house. However, the patient has  many inconsistencies in her story and also reports still being married to her daughter's father who also lives out of state. The patient became agitated during the interview when writer was attempting to clarify her story. She then became focused on the cuts to there right leg expressing severe dissatisfaction in the quality of her dressing changes which appeared to be very appropriate. [name] became very rude demanding when the dressing would be redone.          [name] was admitted to the adult unit where she was evaluated and her symptoms were identified. Medication management was discussed and implemented. Patient was started on Neurontin and Geodon to help stabilize her mood due to displaying a very labile mood. The patient was noted by nursing staff to be very focused on the timing of her prn medications and could be demanding regarding this. She appeared to demonstrate frequent medication seeking behaviors becoming angry when told it was not time for a medication such as soma or klonopin. The patient did not always respond well when limits were set by the nursing staff as she felt entitled to have them whenever she wanted. As a result the times for her prn medications were changed to ensure they were safety spaced out. She was encouraged to participate in unit programming. Medical problems were identified and treated appropriately. Home medication was restarted as needed. [name]  was evaluated each day by a clinical provider  to ascertain the patient's response to treatment.  Improvement was noted by the patient's report of decreasing symptoms, improved sleep and appetite, affect, medication tolerance, behavior, and participation in unit programming.  [name] was asked each day to complete a self inventory noting mood, mental status, pain, new symptoms, anxiety and concerns.         She responded well to medication and being in a therapeutic and supportive environment. Positive and appropriate  behavior was noted and the patient was motivated for recovery.  [name] worked closely with the treatment team and case manager to develop a discharge plan with appropriate goals. Coping skills, problem solving as well as relaxation therapies were also part of the unit programming.         By the day of discharge [name]  was in much improved condition than upon admission.  Symptoms were reported as significantly decreased or resolved completely.  The patient denied SI/HI and voiced no AVH. She was motivated to continue taking medication with a goal of continued improvement in mental health.          [name] was discharged home with a plan to follow up as noted below.  Consults:  psychiatry  Significant Diagnostic Studies:  labs: Admission labs reviewed  Discharge Vitals:   Blood pressure 136/91, pulse 92, temperature 97.7 F (36.5 C), temperature source Oral, resp. rate 20, height 5\' 1"  (1.549 m), weight 112.946 kg (249 lb). Body mass index is 47.07 kg/(m^2). Lab Results:   No results found for this or any previous visit (from the past 72 hour(s)).  Physical Findings: AIMS: Facial and Oral Movements Muscles of Facial Expression: None, normal Lips and Perioral Area: None, normal Jaw: None, normal Tongue: None, normal,Extremity Movements Upper (arms, wrists, hands, fingers): None, normal Lower (legs, knees, ankles, toes): None, normal, Trunk Movements Neck, shoulders, hips: None, normal, Overall Severity Severity of abnormal movements (highest score from questions above): None, normal Incapacitation due to abnormal movements: None, normal Patient's awareness of abnormal movements (rate only patient's report): No Awareness, Dental Status Current problems with teeth and/or dentures?: No Does patient usually wear dentures?: No  CIWA:    COWS:     Psychiatric Specialty Exam: See Psychiatric Specialty Exam and Suicide Risk Assessment completed by Attending Physician prior to  discharge.  Discharge destination:  Home  Is patient on multiple antipsychotic therapies at discharge:  No   Has Patient had three or more failed trials of antipsychotic monotherapy by history:  No  Recommended Plan for Multiple Antipsychotic Therapies: NA  Discharge Orders   Future Orders Complete By Expires   Discharge instructions  As directed    Comments:     Please follow up with your Primary Care MD for further management of your medical conditions.       Medication List    STOP taking these medications       gabapentin 800 MG tablet  Commonly known as:  NEURONTIN  Replaced by:  gabapentin 300 MG capsule      TAKE these medications     Indication   alprazolam 2 MG tablet  Commonly known as:  XANAX  Take 2 mg by mouth 3 (three) times daily as needed for anxiety.      butalbital-acetaminophen-caffeine 50-325-40 MG per tablet  Commonly known as:  FIORICET, ESGIC  Take 2 tablets by mouth 2 (two) times daily.   Indication:  Migraine Headache     carisoprodol 350 MG tablet  Commonly known as:  SOMA  Take 350 mg by mouth 3 (three) times daily as needed for muscle spasms.      doxepin 50 MG capsule  Commonly known as:  SINEQUAN  Take 1 capsule (50 mg total) by mouth at bedtime.   Indication:  Depression, insomnia     DULoxetine 60 MG capsule  Commonly known as:  CYMBALTA  Take 1 capsule (60 mg total) by mouth daily.   Indication:  Generalized Anxiety Disorder, Major Depressive Disorder, Musculoskeletal Pain     gabapentin 300 MG capsule  Commonly known as:  NEURONTIN  Take 2 capsules (600 mg total) by mouth 3 (three) times daily.   Indication:  Aggressive Behavior, Agitation, Fibromyalgia Syndrome, Headache     HYDROcodone-acetaminophen 10-325 MG per tablet  Commonly known as:  NORCO  Take 1 tablet by mouth every 6 (six) hours as needed for pain.      lamoTRIgine 200 MG tablet  Commonly known as:  LAMICTAL  Take 1 tablet (200 mg total) by mouth 2 (two)  times daily.   Indication:  Manic-Depression, Depression     levothyroxine 50 MCG tablet  Commonly known as:  SYNTHROID, LEVOTHROID  Take 1 tablet (50 mcg total) by mouth daily before breakfast.   Indication:  Underactive Thyroid     multivitamin with minerals Tabs tablet  Take 1 tablet by mouth daily.   Indication:  Vitamin Supplementation     Vitamin D-3 5000 UNITS Tabs  Take 1 tablet by mouth daily.   Indication:  Low Vitamin D levels     ziprasidone 60 MG capsule  Commonly known as:  GEODON  Take 1 capsule (60 mg total) by mouth at bedtime.   Indication:  Manic-Depression     zolpidem 10 MG tablet  Commonly known as:  AMBIEN  Take 10 mg by mouth at bedtime as needed for sleep.            Follow-up Information   Follow up with [facility]. (A case manager at the house will make sure that you get to them to re-commence services)    Contact information:   [address]  [place]  [phone]      Follow-up recommendations:   Activity: as tolerated  Diet: healthy  Tests: routine  Other: patient to keep her after care appointment   Comments:    Take all your medications as prescribed by your mental healthcare provider.  Report any adverse effects and or reactions from your medicines to your outpatient provider promptly.  Patient is instructed and cautioned to not engage in alcohol and or illegal drug use while on prescription medicines.  In the event of worsening symptoms, patient is instructed to call the crisis hotline, 911 and or go to the nearest ED for appropriate evaluation and treatment of symptoms.  Follow-up with your primary care provider for your other medical issues, concerns and or health care needs.   Total Discharge Time:  Greater than 30 minutes.  SignedFransisca Kaufmann NP-C 12/18/2012, 10:05 AM

## 2012-12-18 NOTE — Progress Notes (Signed)
[facility] Adult Case Management Discharge Plan :  Will you be returning to the same living situation after discharge: No. At discharge, do you have transportation home?:Yes,  [name] Do you have the ability to pay for your medications:Yes,  mental health  Release of information consent forms completed and in the chart;  Patient's signature needed at discharge.  Patient to Follow up at: Follow-up Information   Follow up with [facility]. (A case manager at the house will make sure that you get to them to re-commence services)    Contact information:   [address]  [place]  [phone]      Patient denies SI/HI:   Yes,  yes    Safety Planning and Suicide Prevention discussed:  Yes,  yes  Brittany [name] 12/18/2012, 3:35 PM

## 2012-12-20 NOTE — Progress Notes (Signed)
Patient Discharge Instructions:  After Visit Summary (AVS):   Faxed to:  12/20/12 Discharge Summary Note:   Faxed to:  12/20/12 Psychiatric Admission Assessment Note:   Faxed to:  12/20/12 Suicide Risk Assessment - Discharge Assessment:   Faxed to:  12/20/12 Faxed/Sent to the Next Level Care provider:  12/20/12 Faxed to [facility] @ [phone]  Jerelene Redden, 12/20/2012, 3:31 PM

## 2012-12-20 NOTE — Discharge Summary (Signed)
Seen and agreed. Aitanna Haubner, MD 

## 2013-01-05 ENCOUNTER — Emergency Department (HOSPITAL_COMMUNITY)
Admission: EM | Admit: 2013-01-05 | Discharge: 2013-01-06 | Disposition: A | Discharge status: Home / Self Care | Payer: Medicaid Other | Attending: Emergency Medicine | Admitting: Emergency Medicine

## 2013-01-05 ENCOUNTER — Encounter (HOSPITAL_COMMUNITY): Payer: Self-pay | Admitting: Emergency Medicine

## 2013-01-05 DIAGNOSIS — E039 Hypothyroidism, unspecified: Secondary | ICD-10-CM | POA: Insufficient documentation

## 2013-01-05 DIAGNOSIS — E049 Nontoxic goiter, unspecified: Secondary | ICD-10-CM | POA: Insufficient documentation

## 2013-01-05 DIAGNOSIS — Z79899 Other long term (current) drug therapy: Secondary | ICD-10-CM | POA: Insufficient documentation

## 2013-01-05 DIAGNOSIS — G43909 Migraine, unspecified, not intractable, without status migrainosus: Secondary | ICD-10-CM | POA: Insufficient documentation

## 2013-01-05 DIAGNOSIS — I10 Essential (primary) hypertension: Secondary | ICD-10-CM | POA: Insufficient documentation

## 2013-01-05 DIAGNOSIS — F319 Bipolar disorder, unspecified: Secondary | ICD-10-CM | POA: Insufficient documentation

## 2013-01-05 DIAGNOSIS — Z8739 Personal history of other diseases of the musculoskeletal system and connective tissue: Secondary | ICD-10-CM | POA: Insufficient documentation

## 2013-01-05 DIAGNOSIS — Z87828 Personal history of other (healed) physical injury and trauma: Secondary | ICD-10-CM | POA: Insufficient documentation

## 2013-01-05 DIAGNOSIS — F411 Generalized anxiety disorder: Secondary | ICD-10-CM | POA: Insufficient documentation

## 2013-01-05 NOTE — ED Notes (Signed)
Per EMS: pt coming from home with c/o migraine for the past five hours. Pt reports nausea and vision changes. Pt has hx of same. Pt is A&Ox4, respirations equal and unlabored, skin warm and dry

## 2013-01-05 NOTE — ED Provider Notes (Signed)
CSN: [id number]     Arrival date & time 01/05/13  1922 History   First MD Initiated Contact with Patient 01/05/13 2126     Chief Complaint  Patient presents with  . Migraine   (Consider location/radiation/quality/duration/timing/severity/associated sxs/prior Treatment) HPI Brittany [name] is a 47 y.o. female who presents to the emergency department for concern of a migraine.  Patient reports that she has a history of migraines and over the last 5 hours has had a bilateral headache.  Similar to numerous previous.  No fevers.  No neck stiffness.  No recent tick bites.  No unilateral weakness/numbness.  Took a Fioricet just PTA.  Now feeling better.  No other symptoms.  Past Medical History  Diagnosis Date  . Hypertension   . Bipolar 1 disorder   . Anxiety   . Back pain   . Bulging disc   . Ruptured intervertebral disc   . Migraine   . Head injury   . Short-term memory loss   . Depression   . Hypothyroidism   . Goiter    Past Surgical History  Procedure Laterality Date  . Abdominal hysterectomy     Family History  Problem Relation Age of Onset  . Migraines Mother   . Migraines Father   . Other Father   . Migraines Sister    History  Substance Use Topics  . Smoking status: Never Smoker   . Smokeless tobacco: Never Used  . Alcohol Use: No   OB History   Grav Para Term Preterm Abortions TAB SAB Ect Mult Living                 Review of Systems  Constitutional: Negative for fever and chills.  HENT: Negative for congestion and rhinorrhea.   Respiratory: Negative for cough and shortness of breath.   Cardiovascular: Negative for chest pain.  Gastrointestinal: Negative for nausea, vomiting, abdominal pain, diarrhea and abdominal distention.  Endocrine: Negative for polyuria.  Genitourinary: Negative for dysuria.  Musculoskeletal: Negative for neck pain and neck stiffness.  Skin: Negative for rash.  Neurological: Positive for headaches.  Psychiatric/Behavioral:  Negative.     Allergies  Toradol and Trazodone and nefazodone  Home Medications   Current Outpatient Rx  Name  Route  Sig  Dispense  Refill  . alprazolam (XANAX) 2 MG tablet   Oral   Take 2 mg by mouth 3 (three) times daily as needed for anxiety.         . butalbital-acetaminophen-caffeine (FIORICET, ESGIC) 50-325-40 MG per tablet   Oral   Take 2 tablets by mouth 2 (two) times daily.   14 tablet   0   . Cholecalciferol (VITAMIN D-3) 5000 UNITS TABS   Oral   Take 1 tablet by mouth daily.   30 tablet      . DULoxetine (CYMBALTA) 60 MG capsule   Oral   Take 1 capsule (60 mg total) by mouth daily.   30 capsule   0   . gabapentin (NEURONTIN) 300 MG capsule   Oral   Take 2 capsules (600 mg total) by mouth 3 (three) times daily.   180 capsule   0   . lamoTRIgine (LAMICTAL) 200 MG tablet   Oral   Take 1 tablet (200 mg total) by mouth 2 (two) times daily.   60 tablet   0   . levothyroxine (SYNTHROID, LEVOTHROID) 50 MCG tablet   Oral   Take 1 tablet (50 mcg total) by mouth daily before breakfast.         .  Multiple Vitamin (MULTIVITAMIN WITH MINERALS) TABS tablet   Oral   Take 1 tablet by mouth daily.         Marland Kitchen zolpidem (AMBIEN) 10 MG tablet   Oral   Take 10 mg by mouth at bedtime as needed for sleep.          BP 107/67  Pulse 76  Temp(Src) 98 F (36.7 C) (Oral)  Resp 18  SpO2 100% Physical Exam  Nursing note and vitals reviewed. Constitutional: She is oriented to person, place, and time. She appears well-developed and well-nourished. No distress.  HENT:  Head: Normocephalic and atraumatic.  Right Ear: External ear normal.  Left Ear: External ear normal.  Nose: Nose normal.  Mouth/Throat: Oropharynx is clear and moist. No oropharyngeal exudate.  Eyes: EOM are normal. Pupils are equal, round, and reactive to light.  Neck: Normal range of motion. Neck supple. No tracheal deviation present.  Cardiovascular: Normal rate.   Pulmonary/Chest: Effort  normal and breath sounds normal. No stridor. No respiratory distress. She has no wheezes. She has no rales.  Abdominal: Soft. She exhibits no distension. There is no tenderness. There is no rebound.  Musculoskeletal: Normal range of motion.  Neurological: She is alert and oriented to person, place, and time. She has normal strength. No cranial nerve deficit or sensory deficit. Coordination normal.  Drowsy.    Skin: Skin is warm and dry. She is not diaphoretic.    ED Course  Procedures (including critical care time) Labs Review Labs Reviewed - No data to display Imaging Review No results found.  EKG Interpretation   None       MDM   1. Headache    Brittany [name] is a 47 y.o. female who presents to the emergency department for concern of headache similar to previous.  Patient had taken a Fioricet earlier prior to arrival and is now somewhat drowsy.  Patient still with good GCS.  Patient improving mentally but still too intoxicated for discharge at this point in time.  Patient signed out to Dr. Rhunette Croft at 0130 to reassess and for ultimate disposition.    Arloa Koh, MD 01/06/13 [phone]

## 2013-01-06 ENCOUNTER — Telehealth (HOSPITAL_BASED_OUTPATIENT_CLINIC_OR_DEPARTMENT_OTHER): Payer: Self-pay

## 2013-01-06 MED ORDER — ACETAMINOPHEN-CODEINE #3 300-30 MG PO TABS: 1.0000 | ORAL_TABLET | Freq: Four times a day (QID) | ORAL | Status: DC | PRN

## 2013-01-06 NOTE — ED Provider Notes (Signed)
  Physical Exam  BP 111/75  Pulse 67  Temp(Src) 98 F (36.7 C) (Oral)  Resp 19  SpO2 100%  Physical Exam  ED Course  Procedures  MDM   Pt comes in with cc of headache - fiorcet and ambien taken by her. Still somnolent. Will monitor closely. No concerns from the headache perspective.  Derwood Kaplan, MD 01/06/13 [phone]

## 2013-01-06 NOTE — ED Provider Notes (Signed)
47 year old female with history of migraines comes in with headache typical of her migraines. She also has a history of drug abuse. She admits to having taken Fioricet at home. On exam, she is resting comfortably and in no acute distress. There no focal neurologic findings. She is observed in the ED and has fallen asleep in spite of the medication being given. She does not appear to have a serious headache at this point.  I saw and evaluated the patient, reviewed the resident's note and I agree with the findings and plan.   Dione Booze, MD 01/06/13 [phone]

## 2013-01-06 NOTE — ED Notes (Signed)
Pt discharged.Vital signs stable and GCS 15.Discharge instruction given. 

## 2013-03-05 ENCOUNTER — Emergency Department (HOSPITAL_COMMUNITY): Payer: Medicaid Other

## 2013-03-05 ENCOUNTER — Emergency Department (HOSPITAL_COMMUNITY)
Admission: EM | Admit: 2013-03-05 | Discharge: 2013-03-05 | Disposition: A | Discharge status: Home / Self Care | Payer: Medicaid Other | Attending: Emergency Medicine | Admitting: Emergency Medicine

## 2013-03-05 ENCOUNTER — Encounter (HOSPITAL_COMMUNITY): Payer: Self-pay | Admitting: Emergency Medicine

## 2013-03-05 DIAGNOSIS — Z79899 Other long term (current) drug therapy: Secondary | ICD-10-CM | POA: Insufficient documentation

## 2013-03-05 DIAGNOSIS — Z8739 Personal history of other diseases of the musculoskeletal system and connective tissue: Secondary | ICD-10-CM | POA: Insufficient documentation

## 2013-03-05 DIAGNOSIS — E039 Hypothyroidism, unspecified: Secondary | ICD-10-CM | POA: Insufficient documentation

## 2013-03-05 DIAGNOSIS — Z87828 Personal history of other (healed) physical injury and trauma: Secondary | ICD-10-CM | POA: Insufficient documentation

## 2013-03-05 DIAGNOSIS — I10 Essential (primary) hypertension: Secondary | ICD-10-CM | POA: Insufficient documentation

## 2013-03-05 DIAGNOSIS — R5383 Other fatigue: Secondary | ICD-10-CM

## 2013-03-05 DIAGNOSIS — F411 Generalized anxiety disorder: Secondary | ICD-10-CM | POA: Insufficient documentation

## 2013-03-05 DIAGNOSIS — G40909 Epilepsy, unspecified, not intractable, without status epilepticus: Secondary | ICD-10-CM | POA: Insufficient documentation

## 2013-03-05 DIAGNOSIS — F319 Bipolar disorder, unspecified: Secondary | ICD-10-CM | POA: Insufficient documentation

## 2013-03-05 DIAGNOSIS — R569 Unspecified convulsions: Secondary | ICD-10-CM

## 2013-03-05 DIAGNOSIS — R5381 Other malaise: Secondary | ICD-10-CM | POA: Insufficient documentation

## 2013-03-05 LAB — CBC WITH DIFFERENTIAL/PLATELET
BASOS ABS: 0 10*3/uL (ref 0.0–0.1)
BASOS PCT: 0 % (ref 0–1)
EOS PCT: 2 % (ref 0–5)
Eosinophils Absolute: 0.2 10*3/uL (ref 0.0–0.7)
HEMATOCRIT: 41.3 % (ref 36.0–46.0)
Hemoglobin: 13.4 g/dL (ref 12.0–15.0)
Lymphocytes Relative: 36 % (ref 12–46)
Lymphs Abs: 5.6 10*3/uL — ABNORMAL HIGH (ref 0.7–4.0)
MCH: 31.5 pg (ref 26.0–34.0)
MCHC: 32.4 g/dL (ref 30.0–36.0)
MCV: 97.2 fL (ref 78.0–100.0)
Monocytes Absolute: 1.1 10*3/uL — ABNORMAL HIGH (ref 0.1–1.0)
Monocytes Relative: 7 % (ref 3–12)
NEUTROS ABS: 8.5 10*3/uL — AB (ref 1.7–7.7)
Neutrophils Relative %: 55 % (ref 43–77)
Platelets: 323 10*3/uL (ref 150–400)
RBC: 4.25 MIL/uL (ref 3.87–5.11)
RDW: 13.8 % (ref 11.5–15.5)
WBC: 15.4 10*3/uL — ABNORMAL HIGH (ref 4.0–10.5)

## 2013-03-05 LAB — COMPREHENSIVE METABOLIC PANEL
ALK PHOS: 95 U/L (ref 39–117)
ALT: 29 U/L (ref 0–35)
AST: 24 U/L (ref 0–37)
Albumin: 4.2 g/dL (ref 3.5–5.2)
BUN: 8 mg/dL (ref 6–23)
CHLORIDE: 97 meq/L (ref 96–112)
CO2: 21 mEq/L (ref 19–32)
Calcium: 9.6 mg/dL (ref 8.4–10.5)
Creatinine, Ser: 0.69 mg/dL (ref 0.50–1.10)
GFR calc Af Amer: >90 mL/min (ref >90)
Glucose, Bld: 105 mg/dL — ABNORMAL HIGH (ref 70–99)
POTASSIUM: 3.8 meq/L (ref 3.7–5.3)
SODIUM: 142 meq/L (ref 137–147)
TOTAL PROTEIN: 8.3 g/dL (ref 6.0–8.3)
Total Bilirubin: 0.3 mg/dL (ref 0.3–1.2)

## 2013-03-05 LAB — GLUCOSE, CAPILLARY: Glucose-Capillary: 106 mg/dL — ABNORMAL HIGH (ref 70–99)

## 2013-03-05 LAB — ETHANOL: Alcohol, Ethyl (B): <11 mg/dL (ref 0–11)

## 2013-03-05 MED ORDER — ONDANSETRON HCL 4 MG/2ML IJ SOLN
4.0000 mg | Freq: Once | INTRAMUSCULAR | Status: AC
  Administered 2013-03-05: 4 mg via INTRAVENOUS
  Filled 2013-03-05: qty 2

## 2013-03-05 MED ORDER — ONDANSETRON 4 MG PO TBDP: ORAL_TABLET | ORAL | Status: DC

## 2013-03-05 MED ORDER — METOCLOPRAMIDE HCL 5 MG PO TABS: 5.0000 mg | ORAL_TABLET | Freq: Four times a day (QID) | ORAL | Status: DC

## 2013-03-05 MED ORDER — FENTANYL CITRATE 0.05 MG/ML IJ SOLN
50.0000 ug | Freq: Once | INTRAMUSCULAR | Status: AC
  Administered 2013-03-05: 50 ug via INTRAVENOUS
  Filled 2013-03-05: qty 2

## 2013-03-05 MED ORDER — LORAZEPAM 2 MG/ML IJ SOLN
INTRAMUSCULAR | Status: AC
  Filled 2013-03-05: qty 1

## 2013-03-05 NOTE — ED Notes (Addendum)
Pt to ED via [facility] with reports of migraine headache and questionable seizure.  Pt st's she has been upset for past week.

## 2013-03-05 NOTE — ED Notes (Signed)
Offered patient assistance with voiding.  No urge to void at this time.

## 2013-03-05 NOTE — ED Notes (Signed)
MD at bedside. 

## 2013-03-05 NOTE — ED Notes (Signed)
MD at bedside. (ER Physician) 

## 2013-03-05 NOTE — ED Provider Notes (Signed)
CSN: [id number]     Arrival date & time 03/05/13  0018 History   First MD Initiated Contact with Patient 03/05/13 0359     Chief Complaint  Patient presents with  . Migraine   (Consider location/radiation/quality/duration/timing/severity/associated sxs/prior Treatment) HPI Comments: 48 yo female with bipolar, anxiety hx presents with migraine and then had seizure in ED waiting room.  Pt brought back while bagging, had 2 min witnessed generalized shaking and was fatigued after, pt had mild cyanosis during event.    Later pt said she has had increased stress, recent intermittent gradual onset HA similar to previous.    Patient is a 48 y.o. female presenting with migraines. The history is provided by the patient.  Migraine    Past Medical History  Diagnosis Date  . Hypertension   . Bipolar 1 disorder   . Anxiety   . Back pain   . Bulging disc   . Ruptured intervertebral disc   . Migraine   . Head injury   . Short-term memory loss   . Depression   . Hypothyroidism   . Goiter    Past Surgical History  Procedure Laterality Date  . Abdominal hysterectomy     Family History  Problem Relation Age of Onset  . Migraines Mother   . Migraines Father   . Other Father   . Migraines Sister    History  Substance Use Topics  . Smoking status: Never Smoker   . Smokeless tobacco: Never Used  . Alcohol Use: No   OB History   Grav Para Term Preterm Abortions TAB SAB Ect Mult Living                 Review of Systems  Unable to perform ROS: Patient nonverbal    Allergies  Toradol and Trazodone and nefazodone  Home Medications   Current Outpatient Rx  Name  Route  Sig  Dispense  Refill  . alprazolam (XANAX) 2 MG tablet   Oral   Take 2 mg by mouth 3 (three) times daily as needed for anxiety.         . butalbital-acetaminophen-caffeine (FIORICET, ESGIC) 50-325-40 MG per tablet   Oral   Take 0.5-1 tablets by mouth daily.         . DULoxetine (CYMBALTA) 30 MG capsule    Oral   Take 90 mg by mouth daily.         Marland Kitchen gabapentin (NEURONTIN) 800 MG tablet   Oral   Take 800 mg by mouth 3 (three) times daily.         Marland Kitchen lamoTRIgine (LAMICTAL) 200 MG tablet   Oral   Take 1 tablet (200 mg total) by mouth 2 (two) times daily.   60 tablet   0   . levothyroxine (SYNTHROID, LEVOTHROID) 50 MCG tablet   Oral   Take 1 tablet (50 mcg total) by mouth daily before breakfast.         . Multiple Vitamin (MULTIVITAMIN WITH MINERALS) TABS tablet   Oral   Take 1 tablet by mouth daily.         . metoCLOPramide (REGLAN) 5 MG tablet   Oral   Take 1 tablet (5 mg total) by mouth 4 (four) times daily.   6 tablet   0   . ondansetron (ZOFRAN ODT) 4 MG disintegrating tablet      4mg  ODT q4 hours prn nausea/vomit   4 tablet   0    BP 128/72  Pulse  88  Temp(Src) 97.6 F (36.4 C) (Oral)  Resp 14  SpO2 98% Physical Exam  Nursing note and vitals reviewed. Constitutional: She appears well-developed and well-nourished.  HENT:  Head: Normocephalic and atraumatic.  Eyes: Conjunctivae are normal. Right eye exhibits no discharge. Left eye exhibits no discharge.  Neck: Normal range of motion. Neck supple. No tracheal deviation present.  Cardiovascular: Normal rate and regular rhythm.   Pulmonary/Chest: Effort normal and breath sounds normal.  Abdominal: Soft. She exhibits no distension. There is no tenderness. There is no guarding.  Musculoskeletal: She exhibits no edema.  Neurological: She is alert. GCS eye subscore is 4. GCS verbal subscore is 5. GCS motor subscore is 6.  Initial exam pt general weak, post ictal, moves all ext with mild weakness, perrl 4 mm bilateral, neck supple. Difficult exam.  Repeat exam 5+ strength in UE and LE with f/e at major joints. Sensation to palpation intact in UE and LE. CNs 2-12 grossly intact.  EOMFI.  PERRL.   Finger nose and coordination intact bilateral.   Visual fields intact to finger testing.   Skin: Skin is warm. No  rash noted.  Psychiatric: She has a normal mood and affect.    ED Course  Procedures (including critical care time) Labs Review Labs Reviewed  GLUCOSE, CAPILLARY - Abnormal; Notable for the following:    Glucose-Capillary 106 (*)    All other components within normal limits  COMPREHENSIVE METABOLIC PANEL - Abnormal; Notable for the following:    Glucose, Bld 105 (*)    All other components within normal limits  CBC WITH DIFFERENTIAL - Abnormal; Notable for the following:    WBC 15.4 (*)    Neutro Abs 8.5 (*)    Lymphs Abs 5.6 (*)    Monocytes Absolute 1.1 (*)    All other components within normal limits  ETHANOL  URINE RAPID DRUG SCREEN (HOSP PERFORMED)  URINALYSIS, ROUTINE W REFLEX MICROSCOPIC   Imaging Review Ct Head Wo Contrast  03/05/2013   CLINICAL DATA:  Migraine headache.  Seizure.  EXAM: CT HEAD WITHOUT CONTRAST  TECHNIQUE: Contiguous axial images were obtained from the base of the skull through the vertex without intravenous contrast.  COMPARISON:  None.  FINDINGS: There is no evidence of acute infarction, mass lesion, or intra- or extra-axial hemorrhage on CT.  The posterior fossa, including the cerebellum, brainstem and fourth ventricle, is within normal limits. The third and lateral ventricles, and basal ganglia are unremarkable in appearance. The cerebral hemispheres are symmetric in appearance, with normal gray-white differentiation. No mass effect or midline shift is seen.  There is no evidence of fracture; visualized osseous structures are unremarkable in appearance. The orbits are within normal limits. The paranasal sinuses and mastoid air cells are well-aerated. No significant soft tissue abnormalities are seen.  IMPRESSION: Unremarkable noncontrast CT of the head.   Electronically Signed   By: Roanna Raider M.D.   On: 03/05/2013 04:22    EKG Interpretation    Date/Time:  Wednesday March 05 2013 03:38:29 EST Ventricular Rate:  102 PR Interval:  194 QRS  Duration: 99 QT Interval:  371 QTC Calculation: 483 R Axis:   46 Text Interpretation:  Sinus tachycardia Confirmed by Baila Rouse  MD, Wrenn Willcox ([id number]) on 03/05/2013 5:53:17 AM            MDM   1. Seizure    Acute seizure, post ictal on initial exam.   Differential complex migraine, seizure new onset, drugs related, psych related, CNS bleed,  metabolic, other. Pt sxs resolved in ED, work up unremarkable.  Pt normal neuro exam on recheck, observed in ED. Discussed with neuro on call, did not rec starting antiepileptics, close fup outpt for EEG, et Karie Sodacetera. Stressed no driving, seizures risks, Melvern Bankeret cetera, pt understands.   Results and differential diagnosis were discussed with the patient. Close follow up outpatient was discussed, patient comfortable with the plan.   Diagnosis: Headache, Seizure    Enid SkeensJoshua M Chee Kinslow, MD 03/05/13 [phone]

## 2013-03-05 NOTE — Discharge Instructions (Signed)
Follow up with neurology for further evaluation of seizures. You are not allowed to drive or operate machinery, do not do anything potentially dangerous alone (ie. Swimming) If you were given medicines take as directed.  If you are on coumadin or contraceptives realize their levels and effectiveness is altered by many different medicines.  If you have any reaction (rash, tongues swelling, other) to the medicines stop taking and see a physician.   Please follow up as directed and return to the ER or see a physician for new or worsening symptoms.  Thank you.  Seizure, Adult A seizure is abnormal electrical activity in the brain. Seizures usually last from 30 seconds to 2 minutes. There are various types of seizures. Before a seizure, you may have a warning sensation (aura) that a seizure is about to occur. An aura may include the following symptoms:   Fear or anxiety.  Nausea.  Feeling like the room is spinning (vertigo).  Vision changes, such as seeing flashing lights or spots. Common symptoms during a seizure include:  A change in attention or behavior (altered mental status).  Convulsions with rhythmic jerking movements.  Drooling.  Rapid eye movements.  Grunting.  Loss of bladder and bowel control.  Bitter taste in the mouth.  Tongue biting. After a seizure, you may feel confused and sleepy. You may also have an injury resulting from convulsions during the seizure. HOME CARE INSTRUCTIONS   If you are given medicines, take them exactly as prescribed by your health care provider.  Keep all follow-up appointments as directed by your health care provider.  Do not swim or drive or engage in risky activity during which a seizure could cause further injury to you or others until your health care provider says it is OK.  Get adequate rest.  Teach friends and family what to do if you have a seizure. They should:  Lay you on the ground to prevent a fall.  Put a cushion under  your head.  Loosen any tight clothing around your neck.  Turn you on your side. If vomiting occurs, this helps keep your airway clear.  Stay with you until you recover.  Know whether or not you need emergency care. SEEK IMMEDIATE MEDICAL CARE IF:  The seizure lasts longer than 5 minutes.  The seizure is severe or you do not wake up immediately after the seizure.  You have an altered mental status after the seizure.  You are having more frequent or worsening seizures. Someone should drive you to the emergency department or call local emergency services (911 in [place] MAKE SURE YOU:  Understand these instructions.  Will watch your condition.  Will get help right away if you are not doing well or get worse. Document Released: 01/28/2000 Document Revised: 11/20/2012 Document Reviewed: 09/11/2012 [facility] Patient Information 2014 [place], [place].

## 2013-06-15 ENCOUNTER — Emergency Department (HOSPITAL_COMMUNITY)
Admission: EM | Admit: 2013-06-15 | Discharge: 2013-06-15 | Disposition: A | Discharge status: Home / Self Care | Payer: Medicaid Other | Attending: Emergency Medicine | Admitting: Emergency Medicine

## 2013-06-15 ENCOUNTER — Emergency Department (HOSPITAL_COMMUNITY): Payer: Medicaid Other

## 2013-06-15 ENCOUNTER — Encounter (HOSPITAL_COMMUNITY): Payer: Self-pay | Admitting: Emergency Medicine

## 2013-06-15 DIAGNOSIS — F3130 Bipolar disorder, current episode depressed, mild or moderate severity, unspecified: Secondary | ICD-10-CM | POA: Insufficient documentation

## 2013-06-15 DIAGNOSIS — R519 Headache, unspecified: Secondary | ICD-10-CM

## 2013-06-15 DIAGNOSIS — G43909 Migraine, unspecified, not intractable, without status migrainosus: Secondary | ICD-10-CM | POA: Insufficient documentation

## 2013-06-15 DIAGNOSIS — I10 Essential (primary) hypertension: Secondary | ICD-10-CM | POA: Insufficient documentation

## 2013-06-15 DIAGNOSIS — F411 Generalized anxiety disorder: Secondary | ICD-10-CM | POA: Insufficient documentation

## 2013-06-15 DIAGNOSIS — Z79899 Other long term (current) drug therapy: Secondary | ICD-10-CM | POA: Insufficient documentation

## 2013-06-15 DIAGNOSIS — Z8739 Personal history of other diseases of the musculoskeletal system and connective tissue: Secondary | ICD-10-CM | POA: Insufficient documentation

## 2013-06-15 DIAGNOSIS — R51 Headache: Secondary | ICD-10-CM

## 2013-06-15 DIAGNOSIS — E039 Hypothyroidism, unspecified: Secondary | ICD-10-CM | POA: Insufficient documentation

## 2013-06-15 LAB — CBC WITH DIFFERENTIAL/PLATELET
BASOS PCT: 0 % (ref 0–1)
Basophils Absolute: 0 10*3/uL (ref 0.0–0.1)
Eosinophils Absolute: 0.1 10*3/uL (ref 0.0–0.7)
Eosinophils Relative: 2 % (ref 0–5)
HEMATOCRIT: 37.9 % (ref 36.0–46.0)
HEMOGLOBIN: 12.5 g/dL (ref 12.0–15.0)
Lymphocytes Relative: 29 % (ref 12–46)
Lymphs Abs: 2.1 10*3/uL (ref 0.7–4.0)
MCH: 31.2 pg (ref 26.0–34.0)
MCHC: 33 g/dL (ref 30.0–36.0)
MCV: 94.5 fL (ref 78.0–100.0)
MONOS PCT: 8 % (ref 3–12)
Monocytes Absolute: 0.5 10*3/uL (ref 0.1–1.0)
NEUTROS ABS: 4.3 10*3/uL (ref 1.7–7.7)
Neutrophils Relative %: 61 % (ref 43–77)
Platelets: 306 10*3/uL (ref 150–400)
RBC: 4.01 MIL/uL (ref 3.87–5.11)
RDW: 13.7 % (ref 11.5–15.5)
WBC: 7.1 10*3/uL (ref 4.0–10.5)

## 2013-06-15 LAB — URINALYSIS, ROUTINE W REFLEX MICROSCOPIC
BILIRUBIN URINE: NEGATIVE
Glucose, UA: NEGATIVE mg/dL
Hgb urine dipstick: NEGATIVE
KETONES UR: NEGATIVE mg/dL
Leukocytes, UA: NEGATIVE
Nitrite: NEGATIVE
Protein, ur: NEGATIVE mg/dL
Specific Gravity, Urine: 1.026 (ref 1.005–1.030)
Urobilinogen, UA: 1 mg/dL (ref 0.0–1.0)
pH: 7.5 (ref 5.0–8.0)

## 2013-06-15 LAB — ETHANOL

## 2013-06-15 LAB — ACETAMINOPHEN LEVEL: Acetaminophen (Tylenol), Serum: <15 ug/mL (ref 10–30)

## 2013-06-15 LAB — RAPID URINE DRUG SCREEN, HOSP PERFORMED
Amphetamines: NOT DETECTED
Barbiturates: POSITIVE — AB
Benzodiazepines: POSITIVE — AB
COCAINE: NOT DETECTED
OPIATES: NOT DETECTED
Tetrahydrocannabinol: NOT DETECTED

## 2013-06-15 LAB — COMPREHENSIVE METABOLIC PANEL
ALK PHOS: 73 U/L (ref 39–117)
ALT: 16 U/L (ref 0–35)
AST: 31 U/L (ref 0–37)
Albumin: 4.2 g/dL (ref 3.5–5.2)
BILIRUBIN TOTAL: 0.2 mg/dL — AB (ref 0.3–1.2)
BUN: 10 mg/dL (ref 6–23)
CHLORIDE: 101 meq/L (ref 96–112)
CO2: 26 mEq/L (ref 19–32)
Calcium: 9.2 mg/dL (ref 8.4–10.5)
Creatinine, Ser: 0.68 mg/dL (ref 0.50–1.10)
GLUCOSE: 92 mg/dL (ref 70–99)
POTASSIUM: 5.2 meq/L (ref 3.7–5.3)
Sodium: 138 mEq/L (ref 137–147)
Total Protein: 8 g/dL (ref 6.0–8.3)

## 2013-06-15 LAB — SALICYLATE LEVEL: Salicylate Lvl: 2.5 mg/dL — ABNORMAL LOW (ref 2.8–20.0)

## 2013-06-15 MED ORDER — LORAZEPAM 2 MG/ML IJ SOLN
0.5000 mg | Freq: Once | INTRAMUSCULAR | Status: AC
  Administered 2013-06-15: 0.5 mg via INTRAVENOUS
  Filled 2013-06-15: qty 1

## 2013-06-15 MED ORDER — FENTANYL CITRATE 0.05 MG/ML IJ SOLN
50.0000 ug | Freq: Once | INTRAMUSCULAR | Status: AC
  Administered 2013-06-15: 50 ug via INTRAVENOUS
  Filled 2013-06-15: qty 2

## 2013-06-15 MED ORDER — DEXAMETHASONE SODIUM PHOSPHATE 10 MG/ML IJ SOLN
10.0000 mg | Freq: Once | INTRAMUSCULAR | Status: AC
  Administered 2013-06-15: 10 mg via INTRAVENOUS
  Filled 2013-06-15: qty 1

## 2013-06-15 NOTE — Discharge Instructions (Signed)
Continue all regular medications. Follow up with your primary care doctor.    Headaches, Frequently Asked Questions MIGRAINE HEADACHES Q: What is migraine? What causes it? How can I treat it? A: Generally, migraine headaches begin as a dull ache. Then they develop into a constant, throbbing, and pulsating pain. You may experience pain at the temples. You may experience pain at the front or back of one or both sides of the head. The pain is usually accompanied by a combination of:  Nausea.  Vomiting.  Sensitivity to light and noise. Some people (about 15%) experience an aura (see below) before an attack. The cause of migraine is believed to be chemical reactions in the brain. Treatment for migraine may include over-the-counter or prescription medications. It may also include self-help techniques. These include relaxation training and biofeedback.  Q: What is an aura? A: About 15% of people with migraine get an "aura". This is a sign of neurological symptoms that occur before a migraine headache. You may see wavy or jagged lines, dots, or flashing lights. You might experience tunnel vision or blind spots in one or both eyes. The aura can include visual or auditory hallucinations (something imagined). It may include disruptions in smell (such as strange odors), taste or touch. Other symptoms include:  Numbness.  A "pins and needles" sensation.  Difficulty in recalling or speaking the correct word. These neurological events may last as long as 60 minutes. These symptoms will fade as the headache begins. Q: What is a trigger? A: Certain physical or environmental factors can lead to or "trigger" a migraine. These include:  Foods.  Hormonal changes.  Weather.  Stress. It is important to remember that triggers are different for everyone. To help prevent migraine attacks, you need to figure out which triggers affect you. Keep a headache diary. This is a good way to track triggers. The diary  will help you talk to your healthcare professional about your condition. Q: Does weather affect migraines? A: Bright sunshine, hot, humid conditions, and drastic changes in barometric pressure may lead to, or "trigger," a migraine attack in some people. But studies have shown that weather does not act as a trigger for everyone with migraines. Q: What is the link between migraine and hormones? A: Hormones start and regulate many of your body's functions. Hormones keep your body in balance within a constantly changing environment. The levels of hormones in your body are unbalanced at times. Examples are during menstruation, pregnancy, or menopause. That can lead to a migraine attack. In fact, about three quarters of all women with migraine report that their attacks are related to the menstrual cycle.  Q: Is there an increased risk of stroke for migraine sufferers? A: The likelihood of a migraine attack causing a stroke is very remote. That is not to say that migraine sufferers cannot have a stroke associated with their migraines. In persons under age 46, the most common associated factor for stroke is migraine headache. But over the course of a person's normal life span, the occurrence of migraine headache may actually be associated with a reduced risk of dying from cerebrovascular disease due to stroke.  Q: What are acute medications for migraine? A: Acute medications are used to treat the pain of the headache after it has started. Examples over-the-counter medications, NSAIDs, ergots, and triptans.  Q: What are the triptans? A: Triptans are the newest class of abortive medications. They are specifically targeted to treat migraine. Triptans are vasoconstrictors. They moderate some chemical  reactions in the brain. The triptans work on receptors in your brain. Triptans help to restore the balance of a neurotransmitter called serotonin. Fluctuations in levels of serotonin are thought to be a main cause of  migraine.  Q: Are over-the-counter medications for migraine effective? A: Over-the-counter, or "OTC," medications may be effective in relieving mild to moderate pain and associated symptoms of migraine. But you should see your caregiver before beginning any treatment regimen for migraine.  Q: What are preventive medications for migraine? A: Preventive medications for migraine are sometimes referred to as "prophylactic" treatments. They are used to reduce the frequency, severity, and length of migraine attacks. Examples of preventive medications include antiepileptic medications, antidepressants, beta-blockers, calcium channel blockers, and NSAIDs (nonsteroidal anti-inflammatory drugs). Q: Why are anticonvulsants used to treat migraine? A: During the past few years, there has been an increased interest in antiepileptic drugs for the prevention of migraine. They are sometimes referred to as "anticonvulsants". Both epilepsy and migraine may be caused by similar reactions in the brain.  Q: Why are antidepressants used to treat migraine? A: Antidepressants are typically used to treat people with depression. They may reduce migraine frequency by regulating chemical levels, such as serotonin, in the brain.  Q: What alternative therapies are used to treat migraine? A: The term "alternative therapies" is often used to describe treatments considered outside the scope of conventional Western medicine. Examples of alternative therapy include acupuncture, acupressure, and yoga. Another common alternative treatment is herbal therapy. Some herbs are believed to relieve headache pain. Always discuss alternative therapies with your caregiver before proceeding. Some herbal products contain arsenic and other toxins. TENSION HEADACHES Q: What is a tension-type headache? What causes it? How can I treat it? A: Tension-type headaches occur randomly. They are often the result of temporary stress, anxiety, fatigue, or anger.  Symptoms include soreness in your temples, a tightening band-like sensation around your head (a "vice-like" ache). Symptoms can also include a pulling feeling, pressure sensations, and contracting head and neck muscles. The headache begins in your forehead, temples, or the back of your head and neck. Treatment for tension-type headache may include over-the-counter or prescription medications. Treatment may also include self-help techniques such as relaxation training and biofeedback. CLUSTER HEADACHES Q: What is a cluster headache? What causes it? How can I treat it? A: Cluster headache gets its name because the attacks come in groups. The pain arrives with little, if any, warning. It is usually on one side of the head. A tearing or bloodshot eye and a runny nose on the same side of the headache may also accompany the pain. Cluster headaches are believed to be caused by chemical reactions in the brain. They have been described as the most severe and intense of any headache type. Treatment for cluster headache includes prescription medication and oxygen. SINUS HEADACHES Q: What is a sinus headache? What causes it? How can I treat it? A: When a cavity in the bones of the face and skull (a sinus) becomes inflamed, the inflammation will cause localized pain. This condition is usually the result of an allergic reaction, a tumor, or an infection. If your headache is caused by a sinus blockage, such as an infection, you will probably have a fever. An x-ray will confirm a sinus blockage. Your caregiver's treatment might include antibiotics for the infection, as well as antihistamines or decongestants.  REBOUND HEADACHES Q: What is a rebound headache? What causes it? How can I treat it? A: A pattern of taking  acute headache medications too often can lead to a condition known as "rebound headache." A pattern of taking too much headache medication includes taking it more than 2 days per week or in excessive amounts.  That means more than the label or a caregiver advises. With rebound headaches, your medications not only stop relieving pain, they actually begin to cause headaches. Doctors treat rebound headache by tapering the medication that is being overused. Sometimes your caregiver will gradually substitute a different type of treatment or medication. Stopping may be a challenge. Regularly overusing a medication increases the potential for serious side effects. Consult a caregiver if you regularly use headache medications more than 2 days per week or more than the label advises. ADDITIONAL QUESTIONS AND ANSWERS Q: What is biofeedback? A: Biofeedback is a self-help treatment. Biofeedback uses special equipment to monitor your body's involuntary physical responses. Biofeedback monitors:  Breathing.  Pulse.  Heart rate.  Temperature.  Muscle tension.  Brain activity. Biofeedback helps you refine and perfect your relaxation exercises. You learn to control the physical responses that are related to stress. Once the technique has been mastered, you do not need the equipment any more. Q: Are headaches hereditary? A: Four out of five (80%) of people that suffer report a family history of migraine. Scientists are not sure if this is genetic or a family predisposition. Despite the uncertainty, a child has a 50% chance of having migraine if one parent suffers. The child has a 75% chance if both parents suffer.  Q: Can children get headaches? A: By the time they reach high school, most young people have experienced some type of headache. Many safe and effective approaches or medications can prevent a headache from occurring or stop it after it has begun.  Q: What type of doctor should I see to diagnose and treat my headache? A: Start with your primary caregiver. Discuss his or her experience and approach to headaches. Discuss methods of classification, diagnosis, and treatment. Your caregiver may decide to recommend  you to a headache specialist, depending upon your symptoms or other physical conditions. Having diabetes, allergies, etc., may require a more comprehensive and inclusive approach to your headache. The National Head[employer] Foundation will provide, upon request, a list of [facility] physician members in your state. Document Released: 04/22/2003 Document Revised: 04/24/2011 Document Reviewed: 09/30/2007 [facility] Patient Information 2014 [place].

## 2013-06-15 NOTE — ED Notes (Signed)
Pt given phone to call her boyfriend which was provided

## 2013-06-15 NOTE — ED Notes (Signed)
Pt asked for ginger ale, milk, graham crackers-all were provided per [place]

## 2013-06-15 NOTE — ED Provider Notes (Signed)
CSN: [id number]     Arrival date & time 06/15/13  1521 History   First MD Initiated Contact with Patient 06/15/13 1530     Chief Complaint  Patient presents with  . Migraine     (Consider location/radiation/quality/duration/timing/severity/associated sxs/prior Treatment) HPI Stanton KidneyKimberley [name] is a 48 y.o. female  Who presents to emergency department complaining of a headache. Patient states she has history of migraines. States that she developed a headache yesterday. He states feels the same as prior headaches. Patient states she's been taking over-the-counter Tylenol yesterday and today with no relief. Patient called EMS and was apparently sitting outside on the porch when EMS arrived. According to EMS they helped her to the stretcher where she appeared to be "nonresponsive" and started shaking. Patient would follow some directions during her shaking. Was seen here 4 days ago for possible seizure. She states she had a migraine at that time as well. Pt denies taking any other medications or drugs other than what she is prescribed. Admits to some nausea and vomiting and diarrhea. Denies abdominal pain. Denies head injury.    Past Medical History  Diagnosis Date  . Hypertension   . Bipolar 1 disorder   . Anxiety   . Back pain   . Bulging disc   . Ruptured intervertebral disc   . Migraine   . Head injury   . Short-term memory loss   . Depression   . Hypothyroidism   . Goiter    Past Surgical History  Procedure Laterality Date  . Abdominal hysterectomy     Family History  Problem Relation Age of Onset  . Migraines Mother   . Migraines Father   . Other Father   . Migraines Sister    History  Substance Use Topics  . Smoking status: Never Smoker   . Smokeless tobacco: Never Used  . Alcohol Use: No   OB History   Grav Para Term Preterm Abortions TAB SAB Ect Mult Living                 Review of Systems  Constitutional: Negative for fever and chills.  HENT: Negative for  congestion and sinus pressure.   Eyes: Negative for photophobia, pain and visual disturbance.  Respiratory: Negative for cough, chest tightness and shortness of breath.   Cardiovascular: Negative for chest pain, palpitations and leg swelling.  Gastrointestinal: Positive for nausea, vomiting and diarrhea. Negative for abdominal pain.  Genitourinary: Negative for dysuria, flank pain, vaginal bleeding, vaginal discharge, vaginal pain and pelvic pain.  Musculoskeletal: Negative for arthralgias, myalgias, neck pain and neck stiffness.  Skin: Negative for rash.  Neurological: Positive for headaches. Negative for dizziness and weakness.  All other systems reviewed and are negative.     Allergies  Toradol and Trazodone and nefazodone  Home Medications   Prior to Admission medications   Medication Sig Start Date End Date Taking? Authorizing Provider  alprazolam Prudy Feeler(XANAX) 2 MG tablet Take 2 mg by mouth 3 (three) times daily as needed for anxiety.    Historical Provider, MD  butalbital-acetaminophen-caffeine (FIORICET, ESGIC) 50-325-40 MG per tablet Take 0.5-1 tablets by mouth daily.    Historical Provider, MD  DULoxetine (CYMBALTA) 30 MG capsule Take 90 mg by mouth daily.    Historical Provider, MD  gabapentin (NEURONTIN) 800 MG tablet Take 800 mg by mouth 3 (three) times daily.    Historical Provider, MD  lamoTRIgine (LAMICTAL) 200 MG tablet Take 1 tablet (200 mg total) by mouth 2 (two) times daily.  12/18/12   Fransisca KaufmannLaura Davis, NP  levothyroxine (SYNTHROID, LEVOTHROID) 50 MCG tablet Take 1 tablet (50 mcg total) by mouth daily before breakfast. 12/18/12   Fransisca KaufmannLaura Davis, NP  metoCLOPramide (REGLAN) 5 MG tablet Take 1 tablet (5 mg total) by mouth 4 (four) times daily. 03/05/13   Enid SkeensJoshua M Zavitz, MD  Multiple Vitamin (MULTIVITAMIN WITH MINERALS) TABS tablet Take 1 tablet by mouth daily. 12/18/12   Fransisca KaufmannLaura Davis, NP  ondansetron (ZOFRAN ODT) 4 MG disintegrating tablet 4mg  ODT q4 hours prn nausea/vomit 03/05/13    Enid SkeensJoshua M Zavitz, MD   BP 131/76  Pulse 88  Resp 20  SpO2 99% Physical Exam  Nursing note and vitals reviewed. Constitutional: She is oriented to person, place, and time. She appears well-developed and well-nourished.  Patient is crying  HENT:  Head: Normocephalic.  Eyes: Conjunctivae are normal.  Pupils dilated  Neck: Neck supple.  Cardiovascular: Normal rate, regular rhythm and normal heart sounds.   Pulmonary/Chest: Effort normal and breath sounds normal. No respiratory distress. She has no wheezes. She has no rales.  Abdominal: Soft. Bowel sounds are normal. She exhibits no distension. There is no tenderness. There is no rebound.  Musculoskeletal: She exhibits no edema.  Neurological: She is alert and oriented to person, place, and time. No cranial nerve deficit. Coordination normal.  Skin: Skin is warm and dry.  Psychiatric: Her affect is inappropriate. Her speech is rapid and/or pressured. She expresses no homicidal and no suicidal ideation.  Pt has child like behavior with frequent crying spells and screaming    ED Course  Procedures (including critical care time) Labs Review Labs Reviewed  COMPREHENSIVE METABOLIC PANEL - Abnormal; Notable for the following:    Total Bilirubin 0.2 (*)    All other components within normal limits  URINE RAPID DRUG SCREEN (HOSP PERFORMED) - Abnormal; Notable for the following:    Benzodiazepines POSITIVE (*)    Barbiturates POSITIVE (*)    All other components within normal limits  SALICYLATE LEVEL - Abnormal; Notable for the following:    Salicylate Lvl 2.5 (*)    All other components within normal limits  URINE CULTURE  CBC WITH DIFFERENTIAL  URINALYSIS, ROUTINE W REFLEX MICROSCOPIC  ACETAMINOPHEN LEVEL  ETHANOL    Imaging Review Ct Head Wo Contrast  06/15/2013   CLINICAL DATA:  Headaches.  EXAM: CT HEAD WITHOUT CONTRAST  TECHNIQUE: Contiguous axial images were obtained from the base of the skull through the vertex without  intravenous contrast.  COMPARISON:  03/05/2013.  FINDINGS: The ventricles are normal in size and configuration. No extra-axial fluid collections are identified. The gray-white differentiation is normal. No CT findings for acute intracranial process such as hemorrhage or infarction. No mass lesions. The brainstem and cerebellum are grossly normal.  The bony structures are intact. The paranasal sinuses and mastoid air cells are clear. The globes are intact.  IMPRESSION: Normal head CT.   Electronically Signed   By: Loralie ChampagneMark  Gallerani M.D.   On: 06/15/2013 17:17     EKG Interpretation   Date/Time:  Sunday Jun 15 2013 16:57:10 EDT Ventricular Rate:  77 PR Interval:  190 QRS Duration: 103 QT Interval:  445 QTC Calculation: 504 R Axis:   44 Text Interpretation:  Sinus rhythm Low voltage, precordial leads  Borderline prolonged QT interval Poor data quality Confirmed by KNAPP   MD-J, JON ([id number]) on 06/15/2013 5:13:06 PM      MDM   Final diagnoses:  Headache    Patient in emergency department  with a headache. I was called to the bedside when the nurse noticed the patient was shaking, she was concerned for seizure. When I got to the room, the patient was able to answer my questions well shaking, was able to look at me and follow directions. I do not think she is having a seizure. Will do labs, CT head, patient complaining of severe headache. States has history of migraines. States only that helps her is fentanyl.  7:32 PM Patient was reassessed multiple times during her stay. Her labs and CT head are all unremarkable. Patient received Decadron and Ativan initially for her symptoms with no relief. She continues to cry out from the room requesting fentanyl. She is allergic to Toradol among some other medications. Have given her 2 rounds of fentanyl and Ativan, she's feeling better. She is drinking fluids and eating crackers in the room. She'll be discharged home with her boyfriend. Instructed to follow  with primary care Dr. At this time no emergent process seen based on exam and history. Her exam is unremarkable.  Filed Vitals:   06/15/13 1521 06/15/13 1527 06/15/13 1904  BP:  131/76 140/80  Pulse:  88 76  Resp:  20 18  SpO2: 96% 99% 97%     Pt refused to leave until I give her prescription for narcotic medications. I have explained to her I did not feel comfortable doing so because she is so sedated and has psychiatric history. She became belligerent, yelling and cursing. She had to be escorted out by Engineer, materials.     Lottie Mussel, PA-C 06/15/13 2044

## 2013-06-15 NOTE — ED Notes (Signed)
Pt. Went  for a CT Scan. Will collect the urine when Pt. Return. Nurse was notified.

## 2013-06-15 NOTE — ED Notes (Signed)
Pt from home via [facility] c/o migraine x1 day. Pt reports inability to sleep d/t pain. Pt is shaking, hitting the bed rails. Pt is A&O and answers questions appropriately.

## 2013-06-15 NOTE — ED Notes (Signed)
Pt began screaming obscenities in the hall.  [facility].  [employer] officer asked to escort pt off of the property.

## 2013-06-15 NOTE — ED Notes (Signed)
Pt refused to go to CT until she received pain meds

## 2013-06-15 NOTE — ED Notes (Signed)
Pt refused IV at first stated that she was a "hard stick." Pt then agreed to place IV. Pt now more cooperative

## 2013-06-15 NOTE — ED Notes (Addendum)
Per [facility], pt called c/o migraine HA x1 day. Pt was sitting on porch when EMS arrived. Pt threw upper body off stretcher and appeared to non-responsive. Pt VSS

## 2013-06-15 NOTE — ED Notes (Signed)
Pt refusing to sign d/c paper because she was not given narcotic rx.

## 2013-06-15 NOTE — ED Notes (Signed)
Bed: RESB Expected date:  Expected time:  Means of arrival:  Comments: Migraine--triage

## 2013-06-15 NOTE — ED Provider Notes (Signed)
Medical screening examination/treatment/procedure(s) were performed by non-physician practitioner and as supervising physician I was immediately available for consultation/collaboration.   EKG Interpretation   Date/Time:  Sunday Jun 15 2013 16:57:10 EDT Ventricular Rate:  77 PR Interval:  190 QRS Duration: 103 QT Interval:  445 QTC Calculation: 504 R Axis:   44 Text Interpretation:  Sinus rhythm Low voltage, precordial leads  Borderline prolonged QT interval Poor data quality Confirmed by Gloria Lambertson   MD-J, Jumar Greenstreet ([id number]) on 06/15/2013 5:13:06 PM      Celene KrasJon R Talha Iser, MD 06/15/13 2315

## 2013-06-16 LAB — URINE CULTURE
COLONY COUNT: NO GROWTH
CULTURE: NO GROWTH

## 2013-09-24 ENCOUNTER — Emergency Department (HOSPITAL_COMMUNITY)
Admission: EM | Admit: 2013-09-24 | Discharge: 2013-09-25 | Disposition: A | Discharge status: Home / Self Care | Payer: Medicaid Other | Attending: Emergency Medicine | Admitting: Emergency Medicine

## 2013-09-24 DIAGNOSIS — I10 Essential (primary) hypertension: Secondary | ICD-10-CM | POA: Diagnosis not present

## 2013-09-24 DIAGNOSIS — F10929 Alcohol use, unspecified with intoxication, unspecified: Secondary | ICD-10-CM

## 2013-09-24 DIAGNOSIS — Z79899 Other long term (current) drug therapy: Secondary | ICD-10-CM | POA: Insufficient documentation

## 2013-09-24 DIAGNOSIS — F411 Generalized anxiety disorder: Secondary | ICD-10-CM | POA: Diagnosis not present

## 2013-09-24 DIAGNOSIS — E039 Hypothyroidism, unspecified: Secondary | ICD-10-CM | POA: Insufficient documentation

## 2013-09-24 DIAGNOSIS — Z008 Encounter for other general examination: Secondary | ICD-10-CM | POA: Diagnosis present

## 2013-09-24 DIAGNOSIS — F319 Bipolar disorder, unspecified: Secondary | ICD-10-CM | POA: Insufficient documentation

## 2013-09-24 DIAGNOSIS — F1010 Alcohol abuse, uncomplicated: Secondary | ICD-10-CM | POA: Insufficient documentation

## 2013-09-24 DIAGNOSIS — Z87828 Personal history of other (healed) physical injury and trauma: Secondary | ICD-10-CM | POA: Insufficient documentation

## 2013-09-24 DIAGNOSIS — F603 Borderline personality disorder: Secondary | ICD-10-CM | POA: Diagnosis not present

## 2013-09-25 ENCOUNTER — Encounter (HOSPITAL_COMMUNITY): Payer: Self-pay | Admitting: Emergency Medicine

## 2013-09-25 DIAGNOSIS — F411 Generalized anxiety disorder: Secondary | ICD-10-CM

## 2013-09-25 DIAGNOSIS — F319 Bipolar disorder, unspecified: Secondary | ICD-10-CM

## 2013-09-25 DIAGNOSIS — F329 Major depressive disorder, single episode, unspecified: Secondary | ICD-10-CM

## 2013-09-25 LAB — CBC WITH DIFFERENTIAL/PLATELET
BASOS ABS: 0 10*3/uL (ref 0.0–0.1)
BASOS PCT: 0 % (ref 0–1)
Eosinophils Absolute: 0.2 10*3/uL (ref 0.0–0.7)
Eosinophils Relative: 2 % (ref 0–5)
HCT: 35.9 % — ABNORMAL LOW (ref 36.0–46.0)
Hemoglobin: 12.2 g/dL (ref 12.0–15.0)
LYMPHS ABS: 3.1 10*3/uL (ref 0.7–4.0)
Lymphocytes Relative: 29 % (ref 12–46)
MCH: 31.1 pg (ref 26.0–34.0)
MCHC: 34 g/dL (ref 30.0–36.0)
MCV: 91.6 fL (ref 78.0–100.0)
Monocytes Absolute: 0.6 10*3/uL (ref 0.1–1.0)
Monocytes Relative: 6 % (ref 3–12)
NEUTROS PCT: 63 % (ref 43–77)
Neutro Abs: 6.8 10*3/uL (ref 1.7–7.7)
PLATELETS: 261 10*3/uL (ref 150–400)
RBC: 3.92 MIL/uL (ref 3.87–5.11)
RDW: 13.5 % (ref 11.5–15.5)
WBC: 10.8 10*3/uL — ABNORMAL HIGH (ref 4.0–10.5)

## 2013-09-25 LAB — RAPID URINE DRUG SCREEN, HOSP PERFORMED
AMPHETAMINES: NOT DETECTED
Barbiturates: POSITIVE — AB
Benzodiazepines: NOT DETECTED
COCAINE: NOT DETECTED
OPIATES: NOT DETECTED
Tetrahydrocannabinol: NOT DETECTED

## 2013-09-25 LAB — COMPREHENSIVE METABOLIC PANEL
ALBUMIN: 4.3 g/dL (ref 3.5–5.2)
ALK PHOS: 66 U/L (ref 39–117)
ALT: 20 U/L (ref 0–35)
AST: 18 U/L (ref 0–37)
Anion gap: 18 — ABNORMAL HIGH (ref 5–15)
BUN: 14 mg/dL (ref 6–23)
CO2: 19 mEq/L (ref 19–32)
Calcium: 9.3 mg/dL (ref 8.4–10.5)
Chloride: 109 mEq/L (ref 96–112)
Creatinine, Ser: 0.81 mg/dL (ref 0.50–1.10)
GFR calc Af Amer: >90 mL/min (ref >90)
GFR calc non Af Amer: 86 mL/min — ABNORMAL LOW (ref >90)
Glucose, Bld: 95 mg/dL (ref 70–99)
POTASSIUM: 3.6 meq/L — AB (ref 3.7–5.3)
Sodium: 146 mEq/L (ref 137–147)
TOTAL PROTEIN: 7.9 g/dL (ref 6.0–8.3)
Total Bilirubin: 0.2 mg/dL — ABNORMAL LOW (ref 0.3–1.2)

## 2013-09-25 LAB — ETHANOL: Alcohol, Ethyl (B): 57 mg/dL — ABNORMAL HIGH (ref 0–11)

## 2013-09-25 LAB — ACETAMINOPHEN LEVEL: Acetaminophen (Tylenol), Serum: <15 ug/mL (ref 10–30)

## 2013-09-25 LAB — SALICYLATE LEVEL

## 2013-09-25 MED ORDER — GABAPENTIN 600 MG PO TABS
600.0000 mg | ORAL_TABLET | Freq: Two times a day (BID) | ORAL | Status: DC
  Filled 2013-09-25 (×2): qty 1

## 2013-09-25 MED ORDER — ZOLPIDEM TARTRATE 5 MG PO TABS
5.0000 mg | ORAL_TABLET | Freq: Every evening | ORAL | Status: DC | PRN
  Administered 2013-09-25: 5 mg via ORAL
  Filled 2013-09-25: qty 1

## 2013-09-25 MED ORDER — LORAZEPAM 1 MG PO TABS
1.0000 mg | ORAL_TABLET | Freq: Three times a day (TID) | ORAL | Status: DC | PRN
  Administered 2013-09-25: 1 mg via ORAL
  Filled 2013-09-25: qty 1

## 2013-09-25 MED ORDER — ALUM & MAG HYDROXIDE-SIMETH 200-200-20 MG/5ML PO SUSP: 30.0000 mL | ORAL | Status: DC | PRN

## 2013-09-25 MED ORDER — DULOXETINE HCL 60 MG PO CPEP: 60.0000 mg | ORAL_CAPSULE | Freq: Every day | ORAL | Status: DC

## 2013-09-25 MED ORDER — DULOXETINE HCL 60 MG PO CPEP
60.0000 mg | ORAL_CAPSULE | Freq: Every day | ORAL | Status: DC
  Filled 2013-09-25: qty 1

## 2013-09-25 MED ORDER — NICOTINE 21 MG/24HR TD PT24
21.0000 mg | MEDICATED_PATCH | Freq: Every day | TRANSDERMAL | Status: DC
  Filled 2013-09-25: qty 1

## 2013-09-25 MED ORDER — BUTALBITAL-APAP-CAFFEINE 50-325-40 MG PO TABS
1.0000 | ORAL_TABLET | ORAL | Status: DC | PRN
  Administered 2013-09-25: 1 via ORAL
  Filled 2013-09-25: qty 1

## 2013-09-25 MED ORDER — VITAMIN D3 25 MCG (1000 UNIT) PO TABS
1000.0000 [IU] | ORAL_TABLET | Freq: Every day | ORAL | Status: DC
  Filled 2013-09-25: qty 1

## 2013-09-25 MED ORDER — ACETAMINOPHEN 325 MG PO TABS
650.0000 mg | ORAL_TABLET | ORAL | Status: DC | PRN
  Administered 2013-09-25: 650 mg via ORAL
  Filled 2013-09-25: qty 2

## 2013-09-25 MED ORDER — GABAPENTIN 600 MG PO TABS: 600.0000 mg | ORAL_TABLET | Freq: Two times a day (BID) | ORAL | Status: DC

## 2013-09-25 MED ORDER — LEVOTHYROXINE SODIUM 50 MCG PO TABS: 50.0000 ug | ORAL_TABLET | Freq: Every day | ORAL | Status: DC

## 2013-09-25 MED ORDER — ONDANSETRON HCL 4 MG PO TABS
4.0000 mg | ORAL_TABLET | Freq: Three times a day (TID) | ORAL | Status: DC | PRN
  Administered 2013-09-25: 4 mg via ORAL
  Filled 2013-09-25: qty 1

## 2013-09-25 MED ORDER — LEVOTHYROXINE SODIUM 50 MCG PO TABS
50.0000 ug | ORAL_TABLET | Freq: Every day | ORAL | Status: DC
  Filled 2013-09-25 (×2): qty 1

## 2013-09-25 MED ORDER — VITAMIN D 1000 UNITS PO TABS: 1000.0000 [IU] | ORAL_TABLET | Freq: Every day | ORAL | Status: DC

## 2013-09-25 NOTE — Consult Note (Signed)
Face to face evaluation and I agree with this note 

## 2013-09-25 NOTE — ED Notes (Signed)
Patient yelling and screaming from her room. Writer ask patient what's wrong patient states " I not suppose to be here I didn't do nothing". Writer explain to patient that the has to have and assessment done to determine what;s the best treatment plan for her. Encouragement and support provided and safety maintain.
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
## 2013-09-25 NOTE — ED Notes (Signed)
eating breakfast

## 2013-09-25 NOTE — ED Notes (Signed)
Dr taylor and conrad in w/ pt  

## 2013-09-25 NOTE — ED Notes (Signed)
Pt was yelling and screaming  that she's feeling sad and would like to know why she is here, encouragement and support was given by nurse and tech

## 2013-09-25 NOTE — ED Notes (Signed)
Up to the bathroom 

## 2013-09-25 NOTE — ED Notes (Signed)
Dr Ladona Ridgelaylor and conrad into see

## 2013-09-25 NOTE — ED Notes (Signed)
Patient given nausea medication and a regular coke for migraine symptoms. Respirations equal and unlabored, skin warm and dry.no acute distress noted.

## 2013-09-25 NOTE — ED Notes (Signed)
Up in hall talking w/ mHt 

## 2013-09-25 NOTE — ED Provider Notes (Signed)
Medical screening examination/treatment/procedure(s) were performed by non-physician practitioner and as supervising physician I was immediately available for consultation/collaboration.   EKG Interpretation None       Derwood KaplanAnkit Rayna Brenner, MD 09/25/13 2348

## 2013-09-25 NOTE — ED Notes (Addendum)
Written dc instructions reviewed w/ Pt.  Pt encouraged to take her medications as directed and follow up with her PMD.  Pt encouraged to keep abrasions clean and dry and watch for s/s of infection.   Pt encouraged to contact her PMD or return for further problems.  Pt verbalized understanding.  Pt ambulatory to dc window w/ mHt, belongings returned after leaving the unit.  Cab voucher provided by CSW.
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
## 2013-09-25 NOTE — ED Notes (Signed)
Talking w/ CSW in the hall

## 2013-09-25 NOTE — ED Notes (Signed)
On the phone 

## 2013-09-25 NOTE — ED Notes (Signed)
Patient arrives to unit crying, yelling "  I didn't do nothing, why am I here". Writer and MHT offer encouragement and support to patient. Patient is hard to console at this time. Patient assisted to bed by Clinical research associatewriter. Safety maintain.
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
## 2013-09-25 NOTE — ED Provider Notes (Signed)
[id number] - Patient care assumed from Marlon Peliffany Greene, PA-C at 0100. Plan includes f/u on labs and TTS evaluation. Labs reviewed, significant for ethanol of 57 and UDS positive for barbiturates. Patient medically cleared at this time. TTS evaluation pending. Disposition to be determined by oncoming ED provider.  Filed Vitals:   09/25/13 0104  BP: 149/82  Pulse: 105  Temp: 97.9 F (36.6 C)  TempSrc: Oral  Resp: 20  SpO2: 99%     Antony MaduraKelly Arnez Stoneking, PA-C 09/25/13 [phone]

## 2013-09-25 NOTE — ED Notes (Signed)
Pt does not wish to take her medication prior to leaving-she is concerned about her dog at home.  CSW to provide cab voucher

## 2013-09-25 NOTE — Consult Note (Signed)
[facility] Face-to[facility] Consult      Subjective: This NP has reviewed the TTS assessment and I concur with its findings. Details have been updated to reflect current patient status. Pt denies SI, HI, and AVH, contracts for safety. Pt has hx of TBI and her mannerisms are consistent with patients presenting with hx of this condition. Pt has delayed responses, is tangential but easily redirected, calm, cooperative, alert/oriented x4, and answers questions appropriately. Pt reports an altercation with her husband and some neighbors which resulted in the police being called, subsequently with IVC papers being initiated. Pt does not meet inpatient criteria and will discharge home; appears to be at baseline.   HPI: Brittany [name] is an 48 y.o. female with hx of Bipolar I Disorder, Anxiety, and  Depression.  [facility] Reportedly [employer] found patient sitting on the curb infront of her home yelling and screaming "Someone call the police". Patient was being very disruptive with outburst about not being able to find her do. Reportedly the dog was in the house. She reports that she had prescription medications and 3 shots of Jagermeister. Patient stating that she and her boyfriend were celebrating because they were getting some money. Patient reports that she doesn't typically drink and realizes that taking her prescription medications with alcohol was not a good idea. She denies that this was a suicide attempt. She has no hx of suicide attempts. She does have a hx of self mutilating behaviors (head banging) which happened yrs ago. Patient does have a hx of depression and anxiety. She reports recent stressors associated with conflict from a neighbor. She reports fair appetite and sleep. Patient denies HI and AVH's. She has [facility], and [facility].     Axis I: Bipolar I Disorder, Generalized Anxiety Disorder, and MDD  Axis II: Deferred Axis III:  Past  Medical History  Diagnosis Date  . Hypertension   . Bipolar 1 disorder   . Anxiety   . Back pain   . Bulging disc   . Ruptured intervertebral disc   . Migraine   . Head injury   . Short-term memory loss   . Depression   . Hypothyroidism   . Goiter    Axis IV: other psychosocial or environmental problems, problems related to social environment, problems with access to health care services and problems with primary support group Axis V: Mild symptoms  Psychiatric Specialty Exam: Physical Exam  ROS  Blood pressure 137/85, pulse 100, temperature 97.9 F (36.6 C), temperature source Oral, resp. rate 16, SpO2 97.00%.There is no weight on file to calculate BMI.  General Appearance: Fairly Groomed  Patent attorneyye Contact::  Good  Speech:  Clear and Coherent  Volume:  Normal  Mood:  Anxious  Affect:  Appropriate and Congruent  Thought Process:  Coherent and Tangential  Orientation:  Full (Time, Place, and Person)  Thought Content:  WDL  Suicidal Thoughts:  No  Homicidal Thoughts:  No  Memory:  Immediate;   Fair Recent;   Fair Remote;   Fair  Judgement:  Fair  Insight:  Fair  Psychomotor Activity:  Normal  Concentration:  Fair  Recall:  Fair  Akathisia:  No  Handed:    AIMS (if indicated):     Assets:  Communication Skills Desire for Improvement Resilience  Sleep:         Past Medical History:  Past Medical History  Diagnosis Date  . Hypertension   .  Bipolar 1 disorder   . Anxiety   . Back pain   . Bulging disc   . Ruptured intervertebral disc   . Migraine   . Head injury   . Short-term memory loss   . Depression   . Hypothyroidism   . Goiter     Past Surgical History  Procedure Laterality Date  . Abdominal hysterectomy      Family History:  Family History  Problem Relation Age of Onset  . Migraines Mother   . Migraines Father   . Other Father   . Migraines Sister     Social History:  reports that she has never smoked. She has never used smokeless  tobacco. She reports that she does not drink alcohol or use illicit drugs.  Additional Social History:  Alcohol / Drug Use Pain Medications: SEE MAR Prescriptions: SEE MAR Over the Counter: SEE MAR History of alcohol / drug use?: Yes Substance #1 Name of Substance 1: Alcohol  1 - Age of First Use: 48 yrs old  1 - Amount (size/oz): "A couple of shots" 1 - Frequency: social drinker only; "Prior to drinking last night I hadn't drank any alcohol in 3-4 months" 1 - Duration: on-going  1 - Last Use / Amount: 09/24/2013  CIWA: CIWA-Ar BP: 137/85 mmHg Pulse Rate: 100 COWS:    Allergies:  Allergies  Allergen Reactions  . Toradol [Ketorolac Tromethamine] Swelling  . Trazodone And Nefazodone     Bladder infection    Home Medications:  (Not in a hospital admission)  OB/GYN Status:  No LMP recorded. Patient has had a hysterectomy.  General Assessment Data [facility] of [facility]: [facility] Is this a Tele or Face-to-Face Assessment?: Tele Assessment Is this an Initial Assessment or a Re-assessment for this encounter?: Initial Assessment Living Arrangements: Spouse/significant other Can pt return to current living arrangement?: Yes Admission Status: Voluntary Is patient capable of signing voluntary admission?: Yes Transfer from: Acute Hospital Referral Source: Self/Family/Friend     [facility] Crisis Care Plan Living Arrangements: Spouse/significant other Name of Psychiatrist:  (No psychiatrist ) Name of Therapist:  (No therapist )  Education Status Is patient currently in school?: No  Risk to self with the past 6 months Suicidal Ideation: No Suicidal Intent: No Is patient at risk for suicide?: No Suicidal Plan?: No Access to Means: No What has been your use of drugs/alcohol within the last 12 months?:  (n/a) Previous Attempts/Gestures: No How many times?:  (0) Other Self Harm Risks:  (yes-head banging b/c I was upset ) Triggers for Past Attempts: Other (Comment) (no previous  attempts/gestures ) Intentional Self Injurious Behavior: None Family Suicide History: Unknown Recent stressful life event(s): Other (Comment) Persecutory voices/beliefs?: No Depression: Yes Depression Symptoms: Feeling angry/irritable;Feeling worthless/self pity;Loss of interest in usual pleasures;Guilt;Fatigue;Isolating;Tearfulness;Insomnia;Despondent Substance abuse history and/or treatment for substance abuse?: No Suicide prevention information given to non-admitted patients: Not applicable  Risk to Others within the past 6 months Homicidal Ideation: No Thoughts of Harm to Others: No Current Homicidal Intent: No Current Homicidal Plan: No Access to Homicidal Means: No Identified Victim:  (n/a) History of harm to others?: No Assessment of Violence: None Noted Violent Behavior Description:  (patient is calm and cooperative ) Does patient have access to weapons?: Yes (Comment) (patient is calm and cooperative ) Criminal Charges Pending?: No Does patient have a court date: No  Psychosis Hallucinations: None noted Delusions: None noted  Mental Status Report Appear/Hygiene: Disheveled Eye Contact: Good Motor Activity: Freedom of movement Speech: Logical/coherent  Level of Consciousness: Alert Mood: Depressed Affect: Appropriate to circumstance Anxiety Level: None Thought Processes: Coherent;Relevant Judgement: Unimpaired Orientation: Person;Place;Time;Situation Obsessive Compulsive Thoughts/Behaviors: None  Cognitive Functioning Concentration: Normal Memory: Recent Intact;Remote Intact IQ: Average Insight: Good Impulse Control: Fair Appetite: Good Weight Loss:  (none reported ) Weight Gain:  (none reported ) Sleep: Decreased Total Hours of Sleep:  (varies ) Vegetative Symptoms: None  ADLScreening [facility] Assessment Services) Patient's cognitive ability adequate to safely complete daily activities?: No Patient able to express need for assistance with ADLs?:  Yes Independently performs ADLs?: Yes (appropriate for developmental age)  Prior Inpatient Therapy Prior Inpatient Therapy: Yes Prior Therapy Dates:  [facility], [facility], & facility in [facility]- pt unable to recall ) Prior Therapy Facilty/Provider(s):  ([place], [facility], and facility in [place] ) Reason for Treatment:  (depression and med mgt, )  Prior Outpatient Therapy Prior Outpatient Therapy: Yes Prior Therapy Dates:  Advice worker) Prior Therapy Facilty/Provider(s):  Museum/gallery curator ) Reason for Treatment:  (med mgmt)  ADL Screening (condition at time of admission) Patient's cognitive ability adequate to safely complete daily activities?: No Is the patient deaf or have difficulty hearing?: No Does the patient have difficulty seeing, even when wearing glasses/contacts?: Yes Does the patient have difficulty concentrating, remembering, or making decisions?: No Patient able to express need for assistance with ADLs?: Yes Does the patient have difficulty dressing or bathing?: No Independently performs ADLs?: Yes (appropriate for developmental age) Does the patient have difficulty walking or climbing stairs?: No Weakness of Legs: None Weakness of Arms/Hands: None  Home Assistive Devices/Equipment Home Assistive Devices/Equipment: None    Abuse/Neglect Assessment (Assessment to be complete while patient is alone) Physical Abuse: Denies Verbal Abuse: Denies Sexual Abuse: Denies Exploitation of patient/patient's resources: Denies Self-Neglect: Denies Values / Beliefs Cultural Requests During Hospitalization: None Spiritual Requests During Hospitalization: None   Advance Directives (For Healthcare) Does patient have an advance directive?: Yes Nutrition Screen- [facility] Adult/[facility] Patient's home diet: Regular  Additional Information 1:1 In Past 12 Months?: No CIRT Risk: No Elopement Risk: No Does patient have medical clearance?: Yes     Disposition:  -Rescind IVC -Discharge pt home with  resources for outpatient followup with Psychiatry and Counseling (encouarge pt to follow up with current provider).   Beau Fanny, FNP-BC 09/25/2013 10:04 AM

## 2013-09-25 NOTE — ED Notes (Addendum)
Superficial Lac  (<0.5") bottom of rt foot cleaned.  Pt reports that she has been walking around her yard bare foot

## 2013-09-25 NOTE — ED Provider Notes (Signed)
Medical screening examination/treatment/procedure(s) were performed by non-physician practitioner and as supervising physician I was immediately available for consultation/collaboration.   EKG Interpretation None       Derwood KaplanAnkit Amol Domanski, MD 09/25/13 [id number]

## 2013-09-25 NOTE — ED Notes (Signed)
resting quietly, nad

## 2013-09-25 NOTE — ED Notes (Signed)
Eating breakfast 

## 2013-09-25 NOTE — ED Notes (Signed)
Pharmacy tech in w/ pt

## 2013-09-25 NOTE — BH Assessment (Signed)
Assessment Note  Brittany [name] is an 48 y.o. female with hx of Bipolar I Disorder, Anxiety, and  Depression.  She was brought to [facility] by [employer]. Reportedly [employer] found patient sitting on the curb infront of her home yelling and screaming "Someone call the police". Patient was being very disruptive with outburst about not being able to find her do. Reportedly the dog was in the house. She reports that she had prescription medications and 3 shots of Jagermeister. Patient stating that she and her boyfriend were celebrating because they were getting some money. Patient reports that she doesn't typically drink and realizes that taking her prescription medications with alcohol was not a good idea. She denies that this was a suicide attempt. She has no hx of suicide attempts. She does have a hx of self mutilating behaviors (head banging) which happened yrs ago. Patient does have a hx of depression and anxiety. She reports recent stressors associated with conflict from a neighbor. She reports fair appetite and sleep. Patient denies HI and AVH's. She has been hospitalized in the past at [facility], [facility], and [facility].     Axis I: Bipolar I Disorder, Anxiety Disorder, and Depressive Disorder NOS Axis II: Deferred Axis III:  Past Medical History  Diagnosis Date  . Hypertension   . Bipolar 1 disorder   . Anxiety   . Back pain   . Bulging disc   . Ruptured intervertebral disc   . Migraine   . Head injury   . Short-term memory loss   . Depression   . Hypothyroidism   . Goiter    Axis IV: other psychosocial or environmental problems, problems related to social environment, problems with access to health care services and problems with primary support group Axis V: 31-40 impairment in reality testing  Past Medical History:  Past Medical History  Diagnosis Date  . Hypertension   . Bipolar 1 disorder   . Anxiety   . Back pain   . Bulging disc   . Ruptured intervertebral disc   . Migraine   . Head  injury   . Short-term memory loss   . Depression   . Hypothyroidism   . Goiter     Past Surgical History  Procedure Laterality Date  . Abdominal hysterectomy      Family History:  Family History  Problem Relation Age of Onset  . Migraines Mother   . Migraines Father   . Other Father   . Migraines Sister     Social History:  reports that she has never smoked. She has never used smokeless tobacco. She reports that she does not drink alcohol or use illicit drugs.  Additional Social History:  Alcohol / Drug Use Pain Medications: SEE MAR Prescriptions: SEE MAR Over the Counter: SEE MAR History of alcohol / drug use?: Yes Substance #1 Name of Substance 1: Alcohol  1 - Age of First Use: 48 yrs old  1 - Amount (size/oz): "A couple of shots" 1 - Frequency: social drinker only; "Prior to drinking last night I hadn't drank any alcohol in 3-4 months" 1 - Duration: on-going  1 - Last Use / Amount: 09/24/2013  CIWA: CIWA-Ar BP: 137/85 mmHg Pulse Rate: 100 COWS:    Allergies:  Allergies  Allergen Reactions  . Toradol [Ketorolac Tromethamine] Swelling  . Trazodone And Nefazodone     Bladder infection    Home Medications:  (Not in a hospital admission)  OB/GYN Status:  No LMP recorded. Patient has had  a hysterectomy.  General Assessment Data [facility] Is this a Tele or Face-to-Face Assessment?: Tele Assessment Is this an Initial Assessment or a Re-assessment for this encounter?: Initial Assessment Living Arrangements: Spouse/significant other Can pt return to current living arrangement?: Yes Admission Status: Voluntary Is patient capable of signing voluntary admission?: Yes Transfer from: Acute Hospital Referral Source: Self/Family/Friend     [facility] Crisis Care Plan Living Arrangements: Spouse/significant other Name of Psychiatrist:  (No psychiatrist ) Name of Therapist:  (No therapist )  Education Status Is patient currently in school?:  No  Risk to self with the past 6 months Suicidal Ideation: No Suicidal Intent: No Is patient at risk for suicide?: No Suicidal Plan?: No Access to Means: No What has been your use of drugs/alcohol within the last 12 months?:  (n/a) Previous Attempts/Gestures: No How many times?:  (0) Other Self Harm Risks:  (yes-head banging b/c I was upset ) Triggers for Past Attempts: Other (Comment) (no previous attempts/gestures ) Intentional Self Injurious Behavior: None Family Suicide History: Unknown Recent stressful life event(s): Other (Comment) Persecutory voices/beliefs?: No Depression: Yes Depression Symptoms: Feeling angry/irritable;Feeling worthless/self pity;Loss of interest in usual pleasures;Guilt;Fatigue;Isolating;Tearfulness;Insomnia;Despondent Substance abuse history and/or treatment for substance abuse?: No Suicide prevention information given to non-admitted patients: Not applicable  Risk to Others within the past 6 months Homicidal Ideation: No Thoughts of Harm to Others: No Current Homicidal Intent: No Current Homicidal Plan: No Access to Homicidal Means: No Identified Victim:  (n/a) History of harm to others?: No Assessment of Violence: None Noted Violent Behavior Description:  (patient is calm and cooperative ) Does patient have access to weapons?: Yes (Comment) (patient is calm and cooperative ) Criminal Charges Pending?: No Does patient have a court date: No  Psychosis Hallucinations: None noted Delusions: None noted  Mental Status Report Appear/Hygiene: Disheveled Eye Contact: Good Motor Activity: Freedom of movement Speech: Logical/coherent Level of Consciousness: Alert Mood: Depressed Affect: Appropriate to circumstance Anxiety Level: None Thought Processes: Coherent;Relevant Judgement: Unimpaired Orientation: Person;Place;Time;Situation Obsessive Compulsive Thoughts/Behaviors: None  Cognitive Functioning Concentration: Normal Memory: Recent  Intact;Remote Intact IQ: Average Insight: Good Impulse Control: Fair Appetite: Good Weight Loss:  (none reported ) Weight Gain:  (none reported ) Sleep: Decreased Total Hours of Sleep:  (varies ) Vegetative Symptoms: None  ADLScreening [facility] Assessment Services) Patient's cognitive ability adequate to safely complete daily activities?: No Patient able to express need for assistance with ADLs?: Yes Independently performs ADLs?: Yes (appropriate for developmental age)  Prior Inpatient Therapy Prior Inpatient Therapy: Yes Prior Therapy Dates:  [facility], [facility], & facility in [facility]- pt unable to recall ) Prior Therapy Facilty/Provider(s):  ([place], [facility], and facility in [place] ) Reason for Treatment:  (depression and med mgt, )  Prior Outpatient Therapy Prior Outpatient Therapy: Yes Prior Therapy Dates:  Advice worker) Prior Therapy Facilty/Provider(s):  Museum/gallery curator ) Reason for Treatment:  (med mgmt)  ADL Screening (condition at time of admission) Patient's cognitive ability adequate to safely complete daily activities?: No Is the patient deaf or have difficulty hearing?: No Does the patient have difficulty seeing, even when wearing glasses/contacts?: Yes Does the patient have difficulty concentrating, remembering, or making decisions?: No Patient able to express need for assistance with ADLs?: Yes Does the patient have difficulty dressing or bathing?: No Independently performs ADLs?: Yes (appropriate for developmental age) Does the patient have difficulty walking or climbing stairs?: No Weakness of Legs: None Weakness of Arms/Hands: None  Home Assistive Devices/Equipment Home Assistive Devices/Equipment: None  Abuse/Neglect Assessment (Assessment to be complete while patient is alone) Physical Abuse: Denies Verbal Abuse: Denies Sexual Abuse: Denies Exploitation of patient/patient's resources: Denies Self-Neglect: Denies Values / Beliefs Cultural Requests During  Hospitalization: None Spiritual Requests During Hospitalization: None   Advance Directives (For Healthcare) Does patient have an advance directive?: Yes Nutrition Screen- [facility] Adult/[facility] Patient's home diet: Regular  Additional Information 1:1 In Past 12 Months?: No CIRT Risk: No Elopement Risk: No Does patient have medical clearance?: Yes     Disposition:  Disposition Initial Assessment Completed for this Encounter: Yes Disposition of Patient: Inpatient treatment program Type of inpatient treatment program: Adult  On Site Evaluation by:   Reviewed with Physician:    Melynda RipplePerry, [place] [facility] 09/25/2013 8:12 AM

## 2013-09-25 NOTE — ED Notes (Signed)
In room, NAD.  Pt is aware that she is going to be dc'd home. Give regular AM  meds and firocet prior to dc

## 2013-10-06 ENCOUNTER — Emergency Department (HOSPITAL_COMMUNITY)
Admission: EM | Admit: 2013-10-06 | Discharge: 2013-10-06 | Disposition: A | Discharge status: Home / Self Care | Payer: Medicaid Other | Attending: Emergency Medicine | Admitting: Emergency Medicine

## 2013-10-06 ENCOUNTER — Emergency Department (HOSPITAL_COMMUNITY): Payer: Medicaid Other

## 2013-10-06 ENCOUNTER — Encounter (HOSPITAL_COMMUNITY): Payer: Self-pay | Admitting: Emergency Medicine

## 2013-10-06 DIAGNOSIS — L237 Allergic contact dermatitis due to plants, except food: Secondary | ICD-10-CM

## 2013-10-06 DIAGNOSIS — Z8639 Personal history of other endocrine, nutritional and metabolic disease: Secondary | ICD-10-CM | POA: Insufficient documentation

## 2013-10-06 DIAGNOSIS — R296 Repeated falls: Secondary | ICD-10-CM | POA: Insufficient documentation

## 2013-10-06 DIAGNOSIS — R21 Rash and other nonspecific skin eruption: Secondary | ICD-10-CM | POA: Diagnosis present

## 2013-10-06 DIAGNOSIS — Z862 Personal history of diseases of the blood and blood-forming organs and certain disorders involving the immune mechanism: Secondary | ICD-10-CM | POA: Diagnosis not present

## 2013-10-06 DIAGNOSIS — S8000XA Contusion of unspecified knee, initial encounter: Secondary | ICD-10-CM | POA: Diagnosis not present

## 2013-10-06 DIAGNOSIS — S8001XA Contusion of right knee, initial encounter: Secondary | ICD-10-CM

## 2013-10-06 DIAGNOSIS — F411 Generalized anxiety disorder: Secondary | ICD-10-CM | POA: Insufficient documentation

## 2013-10-06 DIAGNOSIS — Y9289 Other specified places as the place of occurrence of the external cause: Secondary | ICD-10-CM | POA: Insufficient documentation

## 2013-10-06 DIAGNOSIS — Y9389 Activity, other specified: Secondary | ICD-10-CM | POA: Insufficient documentation

## 2013-10-06 DIAGNOSIS — L255 Unspecified contact dermatitis due to plants, except food: Secondary | ICD-10-CM | POA: Diagnosis not present

## 2013-10-06 MED ORDER — DOXYCYCLINE HYCLATE 100 MG PO TABS: 100.0000 mg | ORAL_TABLET | Freq: Two times a day (BID) | ORAL | Status: DC

## 2013-10-06 MED ORDER — MUPIROCIN 2 % EX OINT
TOPICAL_OINTMENT | CUTANEOUS | Status: DC
Start: 2013-10-06 — End: 2013-11-30

## 2013-10-06 MED ORDER — TRIAMCINOLONE ACETONIDE 0.1 % EX CREA: 1.0000 "application " | TOPICAL_CREAM | Freq: Three times a day (TID) | CUTANEOUS | Status: DC

## 2013-10-06 MED ORDER — HYDROXYZINE HCL 25 MG PO TABS: 25.0000 mg | ORAL_TABLET | Freq: Four times a day (QID) | ORAL | Status: DC | PRN

## 2013-10-06 NOTE — ED Provider Notes (Signed)
Chief Complaint    Chief Complaint  Patient presents with  . Rash  . Knee Pain    History of Present Illness      Brittany [name] is a 48 year old female who has had a 4 day history of a rash on her arms, legs, and truck. This is very pruritic and sometimes painful. It occurred a day after she chased her dog into a patch of undergrowth. She thinks it's poison Ivy. When she was chasing her dog she also fell, abrading her right knee. It's puffy, painful, and there's some redness around the abrasion. She denies any fever. She has to walk with a limp and has pain with flexion of the knee.  Review of Systems   Other than as noted above, the patient denies any of the following symptoms: Systemic:  No fever or chills. ENT:  No nasal congestion, rhinorrhea, sore throat, swelling of lips, tongue or throat. Resp:  No cough, wheezing, or shortness of breath.  PMFSH    Past medical history, family history, social history, meds, and allergies were reviewed. She has a history of bipolar disorder,Hypothyroidism, and anxiety.Current meds include Fioricet, vitamin D, somnolence, Cymbalta, Neurontin, Synthroid, and pravastatin.  Physical Exam     Vital signs:  BP 119/92  Pulse 84  Temp(Src) 98.9 F (37.2 C) (Oral)  Resp 18  SpO2 99% Gen:  Alert, oriented, in no distress. ENT:  Pharynx clear, no intraoral lesions, moist mucous membranes. Lungs:  Clear to auscultation. Skin:  She has streaks and patches of excoriated macula papules and vesicles on her arms, legs, and trunk, consistent with poison Ivy. She also has an abrasion on her right knee with some surrounding erythema and puffiness, but no purulent drainage. Extremities:Her knee has a limited range of motion pain. McMurray's site is negative,Lachman's sign is negative,  drawer sign is negative, and varus and valgus stress are normal.  Radiology   Dg Knee Complete 4 Views Right  10/06/2013   CLINICAL DATA:  Status post fall  EXAM: RIGHT  KNEE - COMPLETE 4+ VIEW  COMPARISON:  None.  FINDINGS: There is no evidence of fracture, dislocation, or joint effusion. There is no evidence of arthropathy or other focal bone abnormality. Soft tissues are unremarkable.  IMPRESSION: Negative.   Electronically Signed   By: Elige Ko   On: 10/06/2013 13:42    I reviewed the images independently and personally and concur with the radiologist's findings.  Assessment    The primary encounter diagnosis was Poison ivy. A diagnosis of Knee contusion, right, initial encounter was also pertinent to this visit.  I am reluctant to treat this with systemic corticosteroids she has a history of bipolar disorder. Will treat with topical steroids and hydroxyzine. She was also given doxycycline, since the abrasion of the knee looks like it's getting infected.  Plan     1.  Meds:  The following meds were prescribed:   Discharge Medication List as of 10/06/2013  2:36 PM    START taking these medications   Details  doxycycline (VIBRA-TABS) 100 MG tablet Take 1 tablet (100 mg total) by mouth 2 (two) times daily., Starting 10/06/2013, Until Discontinued, Print    hydrOXYzine (ATARAX/VISTARIL) 25 MG tablet Take 1 tablet (25 mg total) by mouth every 6 (six) hours as needed for itching., Starting 10/06/2013, Until Discontinued, Print    mupirocin ointment (BACTROBAN) 2 % Apply to infected area on right knee 3 times daily, Print    triamcinolone cream (KENALOG) 0.1 %  Apply 1 application topically 3 (three) times daily., Starting 10/06/2013, Until Discontinued, Print        2.  Patient Education/Counseling:  The patient was given appropriate handouts, self care instructions, and instructed in symptomatic relief.    3.  Follow up:  The patient was told to follow up here if no better in 3 to 4 days, or sooner if becoming worse in any way, and given some red flag symptoms such as worsening rash, fever, or difficulty breathing which would prompt immediate return.   Follow up here if necessary.      Reuben Likes, MD 10/06/13 [phone]

## 2013-10-06 NOTE — Discharge Instructions (Signed)
Poison Ivy °Poison ivy is a inflammation of the skin (contact dermatitis) caused by touching the allergens on the leaves of the ivy plant following previous exposure to the plant. The rash usually appears 48 hours after exposure. The rash is usually bumps (papules) or blisters (vesicles) in a linear pattern. Depending on your own sensitivity, the rash may simply cause redness and itching, or it may also progress to blisters which may break open. These must be well cared for to prevent secondary bacterial (germ) infection, followed by scarring. Keep any open areas dry, clean, dressed, and covered with an antibacterial ointment if needed. The eyes may also get puffy. The puffiness is worst in the morning and gets better as the day progresses. This dermatitis usually heals without scarring, within 2 to 3 weeks without treatment. °HOME CARE INSTRUCTIONS  °Thoroughly wash with soap and water as soon as you have been exposed to poison ivy. You have about one half hour to remove the plant resin before it will cause the rash. This washing will destroy the oil or antigen on the skin that is causing, or will cause, the rash. Be sure to wash under your fingernails as any plant resin there will continue to spread the rash. Do not rub skin vigorously when washing affected area. Poison ivy cannot spread if no oil from the plant remains on your body. A rash that has progressed to weeping sores will not spread the rash unless you have not washed thoroughly. It is also important to wash any clothes you have been wearing as these may carry active allergens. The rash will return if you wear the unwashed clothing, even several days later. °Avoidance of the plant in the future is the best measure. Poison ivy plant can be recognized by the number of leaves. Generally, poison ivy has three leaves with flowering branches on a single stem. °Diphenhydramine may be purchased over the counter and used as needed for itching. Do not drive with  this medication if it makes you drowsy.Ask your caregiver about medication for children. °SEEK MEDICAL CARE IF: °· Open sores develop. °· Redness spreads beyond area of rash. °· You notice purulent (pus-like) discharge. °· You have increased pain. °· Other signs of infection develop (such as fever). °Document Released: 01/28/2000 Document Revised: 04/24/2011 Document Reviewed: 07/10/2008 °[employer]® Patient Information ©2015 [employer]. This information is not intended to replace advice given to you by your health care provider. Make sure you discuss any questions you have with your health care provider. °Cellulitis °Cellulitis is an infection of the skin and the tissue beneath it. The infected area is usually red and tender. Cellulitis occurs most often in the arms and lower legs.  °CAUSES  °Cellulitis is caused by bacteria that enter the skin through cracks or cuts in the skin. The most common types of bacteria that cause cellulitis are staphylococci and streptococci. °SIGNS AND SYMPTOMS  °· Redness and warmth. °· Swelling. °· Tenderness or pain. °· Fever. °DIAGNOSIS  °Your health care provider can usually determine what is wrong based on a physical exam. Blood tests may also be done. °TREATMENT  °Treatment usually involves taking an antibiotic medicine. °HOME CARE INSTRUCTIONS  °· Take your antibiotic medicine as directed by your health care provider. Finish the antibiotic even if you start to feel better. °· Keep the infected arm or leg elevated to reduce swelling. °· Apply a warm cloth to the affected area up to 4 times per day to relieve pain. °· Take medicines   only as directed by your health care provider. °· Keep all follow-up visits as directed by your health care provider. °SEEK MEDICAL CARE IF:  °· You notice red streaks coming from the infected area. °· Your red area gets larger or turns dark in color. °· Your bone or joint underneath the infected area becomes painful after the skin has healed. °· Your  infection returns in the same area or another area. °· You notice a swollen bump in the infected area. °· You develop new symptoms. °· You have a fever. °SEEK IMMEDIATE MEDICAL CARE IF:  °· You feel very sleepy. °· You develop vomiting or diarrhea. °· You have a general ill feeling (malaise) with muscle aches and pains. °MAKE SURE YOU:  °· Understand these instructions. °· Will watch your condition. °· Will get help right away if you are not doing well or get worse. °Document Released: 11/09/2004 Document Revised: 06/16/2013 Document Reviewed: 04/17/2011 °[employer]® Patient Information ©2015 [employer]. This information is not intended to replace advice given to you by your health care provider. Make sure you discuss any questions you have with your health care provider. ° °

## 2013-10-06 NOTE — ED Notes (Signed)
Pt has rash to various areas of her body, thinks its poison because she had to chase her dog in the park into a bush. Also right knee pain after falling into bush. Its itchy.

## 2013-11-13 DIAGNOSIS — R519 Headache, unspecified: Secondary | ICD-10-CM

## 2013-11-13 HISTORY — DX: Headache, unspecified: R51.9

## 2013-11-13 HISTORY — PX: EYE SURGERY: SHX253

## 2013-11-29 ENCOUNTER — Emergency Department (HOSPITAL_COMMUNITY): Payer: Medicaid Other

## 2013-11-29 ENCOUNTER — Inpatient Hospital Stay (HOSPITAL_COMMUNITY)
Admission: EM | Admit: 2013-11-29 | Discharge: 2013-12-01 | DRG: 125 | Disposition: A | Discharge status: Home / Self Care | Payer: Medicaid Other | Attending: General Surgery | Admitting: General Surgery

## 2013-11-29 DIAGNOSIS — W010XXA Fall on same level from slipping, tripping and stumbling without subsequent striking against object, initial encounter: Secondary | ICD-10-CM | POA: Diagnosis present

## 2013-11-29 DIAGNOSIS — S0285XA Fracture of orbit, unspecified, initial encounter for closed fracture: Secondary | ICD-10-CM | POA: Diagnosis present

## 2013-11-29 DIAGNOSIS — H40051 Ocular hypertension, right eye: Secondary | ICD-10-CM

## 2013-11-29 DIAGNOSIS — R4182 Altered mental status, unspecified: Secondary | ICD-10-CM

## 2013-11-29 DIAGNOSIS — S0231XA Fracture of orbital floor, right side, initial encounter for closed fracture: Secondary | ICD-10-CM

## 2013-11-29 DIAGNOSIS — I10 Essential (primary) hypertension: Secondary | ICD-10-CM | POA: Diagnosis present

## 2013-11-29 DIAGNOSIS — G521 Disorders of glossopharyngeal nerve: Secondary | ICD-10-CM | POA: Diagnosis present

## 2013-11-29 DIAGNOSIS — Y93K1 Activity, walking an animal: Secondary | ICD-10-CM

## 2013-11-29 DIAGNOSIS — S02839A Fracture of medial orbital wall, unspecified side, initial encounter for closed fracture: Secondary | ICD-10-CM

## 2013-11-29 DIAGNOSIS — H0520 Unspecified exophthalmos: Secondary | ICD-10-CM

## 2013-11-29 DIAGNOSIS — E039 Hypothyroidism, unspecified: Secondary | ICD-10-CM | POA: Diagnosis present

## 2013-11-29 DIAGNOSIS — H47091 Other disorders of optic nerve, not elsewhere classified, right eye: Secondary | ICD-10-CM | POA: Diagnosis present

## 2013-11-29 DIAGNOSIS — S02401A Maxillary fracture, unspecified, initial encounter for closed fracture: Secondary | ICD-10-CM | POA: Diagnosis present

## 2013-11-29 DIAGNOSIS — W19XXXA Unspecified fall, initial encounter: Secondary | ICD-10-CM

## 2013-11-29 DIAGNOSIS — S023XXA Fracture of orbital floor, initial encounter for closed fracture: Principal | ICD-10-CM | POA: Diagnosis present

## 2013-11-29 DIAGNOSIS — F319 Bipolar disorder, unspecified: Secondary | ICD-10-CM | POA: Diagnosis present

## 2013-11-29 DIAGNOSIS — S022XXB Fracture of nasal bones, initial encounter for open fracture: Secondary | ICD-10-CM

## 2013-11-29 DIAGNOSIS — S022XXA Fracture of nasal bones, initial encounter for closed fracture: Secondary | ICD-10-CM | POA: Diagnosis present

## 2013-11-29 LAB — I-STAT CHEM 8, ED
BUN: 18 mg/dL (ref 6–23)
Calcium, Ion: 1.13 mmol/L (ref 1.12–1.23)
Chloride: 103 mEq/L (ref 96–112)
Creatinine, Ser: 0.6 mg/dL (ref 0.50–1.10)
Glucose, Bld: 116 mg/dL — ABNORMAL HIGH (ref 70–99)
HCT: 37 % (ref 36.0–46.0)
Hemoglobin: 12.6 g/dL (ref 12.0–15.0)
Potassium: 3.7 mEq/L (ref 3.7–5.3)
Sodium: 141 mEq/L (ref 137–147)
TCO2: 26 mmol/L (ref 0–100)

## 2013-11-29 LAB — CBC WITH DIFFERENTIAL/PLATELET
BASOS PCT: 0 % (ref 0–1)
Basophils Absolute: 0 10*3/uL (ref 0.0–0.1)
EOS PCT: 2 % (ref 0–5)
Eosinophils Absolute: 0.1 10*3/uL (ref 0.0–0.7)
HEMATOCRIT: 35 % — AB (ref 36.0–46.0)
Hemoglobin: 11.2 g/dL — ABNORMAL LOW (ref 12.0–15.0)
Lymphocytes Relative: 16 % (ref 12–46)
Lymphs Abs: 1.2 10*3/uL (ref 0.7–4.0)
MCH: 30.3 pg (ref 26.0–34.0)
MCHC: 32 g/dL (ref 30.0–36.0)
MCV: 94.6 fL (ref 78.0–100.0)
MONO ABS: 0.5 10*3/uL (ref 0.1–1.0)
Monocytes Relative: 7 % (ref 3–12)
Neutro Abs: 5.3 10*3/uL (ref 1.7–7.7)
Neutrophils Relative %: 75 % (ref 43–77)
Platelets: 222 10*3/uL (ref 150–400)
RBC: 3.7 MIL/uL — ABNORMAL LOW (ref 3.87–5.11)
RDW: 13.5 % (ref 11.5–15.5)
WBC: 7.2 10*3/uL (ref 4.0–10.5)

## 2013-11-29 LAB — BASIC METABOLIC PANEL
ANION GAP: 11 (ref 5–15)
BUN: 18 mg/dL (ref 6–23)
CALCIUM: 9 mg/dL (ref 8.4–10.5)
CO2: 27 mEq/L (ref 19–32)
Chloride: 102 mEq/L (ref 96–112)
Creatinine, Ser: 0.63 mg/dL (ref 0.50–1.10)
GFR calc Af Amer: >90 mL/min (ref >90)
Glucose, Bld: 111 mg/dL — ABNORMAL HIGH (ref 70–99)
Potassium: 3.9 mEq/L (ref 3.7–5.3)
Sodium: 140 mEq/L (ref 137–147)

## 2013-11-29 LAB — TYPE AND SCREEN
ABO/RH(D): A POS
Antibody Screen: NEGATIVE

## 2013-11-29 LAB — ETHANOL

## 2013-11-29 LAB — PROTIME-INR
INR: 1.1 (ref 0.00–1.49)
Prothrombin Time: 14.3 seconds (ref 11.6–15.2)

## 2013-11-29 LAB — SALICYLATE LEVEL: Salicylate Lvl: <2 mg/dL — ABNORMAL LOW (ref 2.8–20.0)

## 2013-11-29 LAB — ACETAMINOPHEN LEVEL: Acetaminophen (Tylenol), Serum: <15 ug/mL (ref 10–30)

## 2013-11-29 MED ORDER — TETRACAINE HCL 0.5 % OP SOLN
1.0000 [drp] | OPHTHALMIC | Status: AC
  Administered 2013-11-29: 1 [drp] via OPHTHALMIC
  Filled 2013-11-29: qty 2

## 2013-11-29 MED ORDER — SUCCINYLCHOLINE CHLORIDE 20 MG/ML IJ SOLN
INTRAMUSCULAR | Status: DC
Start: 2013-11-29 — End: 2013-11-30
  Filled 2013-11-29: qty 1

## 2013-11-29 MED ORDER — SODIUM CHLORIDE 0.9 % IV BOLUS (SEPSIS)
1000.0000 mL | Freq: Once | INTRAVENOUS | Status: AC
  Administered 2013-11-29: 1000 mL via INTRAVENOUS

## 2013-11-29 MED ORDER — LIDOCAINE HCL (CARDIAC) 20 MG/ML IV SOLN
INTRAVENOUS | Status: DC
Start: 2013-11-29 — End: 2013-11-30
  Filled 2013-11-29: qty 5

## 2013-11-29 MED ORDER — ETOMIDATE 2 MG/ML IV SOLN
INTRAVENOUS | Status: AC
  Filled 2013-11-29: qty 20

## 2013-11-29 MED ORDER — LIDOCAINE HCL (PF) 1 % IJ SOLN
5.0000 mL | Freq: Once | INTRAMUSCULAR | Status: DC
  Filled 2013-11-29: qty 5

## 2013-11-29 MED ORDER — ROCURONIUM BROMIDE 50 MG/5ML IV SOLN
INTRAVENOUS | Status: AC
  Filled 2013-11-29: qty 2
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
## 2013-11-29 NOTE — ED Notes (Signed)
[employer] and [employer] at bedside. 
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
## 2013-11-30 ENCOUNTER — Encounter (HOSPITAL_COMMUNITY): Payer: Self-pay | Admitting: Emergency Medicine

## 2013-11-30 DIAGNOSIS — H47091 Other disorders of optic nerve, not elsewhere classified, right eye: Secondary | ICD-10-CM | POA: Diagnosis present

## 2013-11-30 DIAGNOSIS — S023XXA Fracture of orbital floor, initial encounter for closed fracture: Secondary | ICD-10-CM | POA: Diagnosis not present

## 2013-11-30 DIAGNOSIS — W010XXA Fall on same level from slipping, tripping and stumbling without subsequent striking against object, initial encounter: Secondary | ICD-10-CM | POA: Diagnosis present

## 2013-11-30 DIAGNOSIS — S0285XA Fracture of orbit, unspecified, initial encounter for closed fracture: Secondary | ICD-10-CM | POA: Diagnosis present

## 2013-11-30 DIAGNOSIS — G521 Disorders of glossopharyngeal nerve: Secondary | ICD-10-CM | POA: Diagnosis present

## 2013-11-30 DIAGNOSIS — S022XXA Fracture of nasal bones, initial encounter for closed fracture: Secondary | ICD-10-CM | POA: Diagnosis present

## 2013-11-30 DIAGNOSIS — E039 Hypothyroidism, unspecified: Secondary | ICD-10-CM | POA: Diagnosis present

## 2013-11-30 DIAGNOSIS — I10 Essential (primary) hypertension: Secondary | ICD-10-CM | POA: Diagnosis present

## 2013-11-30 DIAGNOSIS — Y93K1 Activity, walking an animal: Secondary | ICD-10-CM | POA: Diagnosis not present

## 2013-11-30 DIAGNOSIS — S02401A Maxillary fracture, unspecified, initial encounter for closed fracture: Secondary | ICD-10-CM | POA: Diagnosis present

## 2013-11-30 DIAGNOSIS — F319 Bipolar disorder, unspecified: Secondary | ICD-10-CM | POA: Diagnosis present

## 2013-11-30 DIAGNOSIS — H0520 Unspecified exophthalmos: Secondary | ICD-10-CM | POA: Diagnosis not present

## 2013-11-30 LAB — CBC
HCT: 36 % (ref 36.0–46.0)
HCT: 31.6 % — ABNORMAL LOW (ref 36.0–46.0)
HEMOGLOBIN: 11.7 g/dL — AB (ref 12.0–15.0)
Hemoglobin: 10.3 g/dL — ABNORMAL LOW (ref 12.0–15.0)
MCH: 31.2 pg (ref 26.0–34.0)
MCH: 31 pg (ref 26.0–34.0)
MCHC: 32.5 g/dL (ref 30.0–36.0)
MCHC: 32.6 g/dL (ref 30.0–36.0)
MCV: 96 fL (ref 78.0–100.0)
MCV: 95.2 fL (ref 78.0–100.0)
PLATELETS: 196 10*3/uL (ref 150–400)
Platelets: 208 10*3/uL (ref 150–400)
RBC: 3.75 MIL/uL — ABNORMAL LOW (ref 3.87–5.11)
RBC: 3.32 MIL/uL — AB (ref 3.87–5.11)
RDW: 13.6 % (ref 11.5–15.5)
RDW: 13.6 % (ref 11.5–15.5)
WBC: 6.7 10*3/uL (ref 4.0–10.5)
WBC: 6.7 10*3/uL (ref 4.0–10.5)

## 2013-11-30 LAB — RAPID URINE DRUG SCREEN, HOSP PERFORMED
Amphetamines: NOT DETECTED
BARBITURATES: POSITIVE — AB
Benzodiazepines: POSITIVE — AB
COCAINE: NOT DETECTED
Opiates: NOT DETECTED
TETRAHYDROCANNABINOL: NOT DETECTED

## 2013-11-30 LAB — BASIC METABOLIC PANEL
ANION GAP: 10 (ref 5–15)
BUN: 11 mg/dL (ref 6–23)
CO2: 25 meq/L (ref 19–32)
Calcium: 8.3 mg/dL — ABNORMAL LOW (ref 8.4–10.5)
Chloride: 103 mEq/L (ref 96–112)
Creatinine, Ser: 0.62 mg/dL (ref 0.50–1.10)
GFR calc Af Amer: >90 mL/min (ref >90)
GFR calc non Af Amer: >90 mL/min (ref >90)
Glucose, Bld: 112 mg/dL — ABNORMAL HIGH (ref 70–99)
Potassium: 3.9 mEq/L (ref 3.7–5.3)
SODIUM: 138 meq/L (ref 137–147)

## 2013-11-30 LAB — CREATININE, SERUM
CREATININE: 0.61 mg/dL (ref 0.50–1.10)
GFR calc Af Amer: >90 mL/min (ref >90)

## 2013-11-30 LAB — MRSA PCR SCREENING: MRSA BY PCR: NEGATIVE

## 2013-11-30 LAB — ABO/RH: ABO/RH(D): A POS

## 2013-11-30 LAB — CDS SEROLOGY

## 2013-11-30 MED ORDER — ONDANSETRON HCL 4 MG/2ML IJ SOLN: 4.0000 mg | Freq: Four times a day (QID) | INTRAMUSCULAR | Status: DC | PRN

## 2013-11-30 MED ORDER — OXYCODONE HCL 5 MG PO TABS
5.0000 mg | ORAL_TABLET | ORAL | Status: DC | PRN
  Administered 2013-11-30: 5 mg via ORAL
  Filled 2013-11-30: qty 1

## 2013-11-30 MED ORDER — ENOXAPARIN SODIUM 40 MG/0.4ML ~~LOC~~ SOLN
40.0000 mg | SUBCUTANEOUS | Status: DC
  Administered 2013-12-01: 40 mg via SUBCUTANEOUS
  Filled 2013-11-30: qty 0.4

## 2013-11-30 MED ORDER — DULOXETINE HCL 60 MG PO CPEP
60.0000 mg | ORAL_CAPSULE | Freq: Every day | ORAL | Status: DC
  Administered 2013-11-30 – 2013-12-01 (×2): 60 mg via ORAL
  Filled 2013-11-30 (×3): qty 1

## 2013-11-30 MED ORDER — GABAPENTIN 600 MG PO TABS
600.0000 mg | ORAL_TABLET | ORAL | Status: DC
Start: 2013-12-01 — End: 2013-12-01
  Administered 2013-12-01: 600 mg via ORAL
  Filled 2013-11-30: qty 1

## 2013-11-30 MED ORDER — ONDANSETRON HCL 4 MG PO TABS: 4.0000 mg | ORAL_TABLET | Freq: Four times a day (QID) | ORAL | Status: DC | PRN

## 2013-11-30 MED ORDER — OXYCODONE HCL 5 MG PO TABS
5.0000 mg | ORAL_TABLET | ORAL | Status: DC | PRN
  Administered 2013-11-30 – 2013-12-01 (×5): 10 mg via ORAL
  Filled 2013-11-30 (×5): qty 2

## 2013-11-30 MED ORDER — MORPHINE SULFATE 2 MG/ML IJ SOLN
2.0000 mg | INTRAMUSCULAR | Status: DC | PRN
  Administered 2013-11-30 – 2013-12-01 (×4): 4 mg via INTRAVENOUS
  Administered 2013-12-01: 2 mg via INTRAVENOUS
  Filled 2013-11-30 (×4): qty 2
  Filled 2013-11-30: qty 1

## 2013-11-30 MED ORDER — ACETAMINOPHEN 325 MG PO TABS
650.0000 mg | ORAL_TABLET | ORAL | Status: DC | PRN
  Administered 2013-11-30 – 2013-12-01 (×3): 650 mg via ORAL
  Filled 2013-11-30 (×3): qty 2

## 2013-11-30 MED ORDER — KCL IN DEXTROSE-NACL 20-5-0.45 MEQ/L-%-% IV SOLN
INTRAVENOUS | Status: DC
  Administered 2013-11-30 – 2013-12-01 (×2): via INTRAVENOUS
  Administered 2013-12-01: 1000 mL via INTRAVENOUS
  Filled 2013-11-30 (×7): qty 1000

## 2013-11-30 MED ORDER — LEVOTHYROXINE SODIUM 50 MCG PO TABS
50.0000 ug | ORAL_TABLET | Freq: Every day | ORAL | Status: DC
Start: 2013-11-30 — End: 2013-12-01
  Administered 2013-11-30 – 2013-12-01 (×2): 50 ug via ORAL
  Filled 2013-11-30 (×4): qty 1

## 2013-11-30 MED ORDER — PANTOPRAZOLE SODIUM 40 MG PO TBEC
40.0000 mg | DELAYED_RELEASE_TABLET | Freq: Every day | ORAL | Status: DC
  Administered 2013-11-30 – 2013-12-01 (×2): 40 mg via ORAL
  Filled 2013-11-30 (×2): qty 1

## 2013-11-30 MED ORDER — TRIAMCINOLONE ACETONIDE 0.1 % EX CREA
1.0000 "application " | TOPICAL_CREAM | Freq: Three times a day (TID) | CUTANEOUS | Status: DC
  Administered 2013-11-30 – 2013-12-01 (×3): 1 via TOPICAL
  Filled 2013-11-30: qty 15

## 2013-11-30 MED ORDER — PANTOPRAZOLE SODIUM 40 MG IV SOLR
40.0000 mg | Freq: Every day | INTRAVENOUS | Status: DC
  Filled 2013-11-30: qty 40

## 2013-11-30 MED ORDER — ALPRAZOLAM 0.5 MG PO TABS
0.5000 mg | ORAL_TABLET | Freq: Three times a day (TID) | ORAL | Status: DC | PRN
  Administered 2013-11-30 – 2013-12-01 (×3): 0.5 mg via ORAL
  Filled 2013-11-30 (×3): qty 1

## 2013-11-30 MED ORDER — HYDROXYZINE HCL 25 MG PO TABS
25.0000 mg | ORAL_TABLET | Freq: Four times a day (QID) | ORAL | Status: DC | PRN
  Filled 2013-11-30: qty 1

## 2013-11-30 NOTE — Progress Notes (Signed)
Subjective: Pt alert moving everything well complaining of pain, wants her xanax 1 mg qid at home. More pain medicine and something to drink.  Objective: Vital signs in last 24 hours: Temp:  [95.9 F (35.5 C)-98.5 F (36.9 C)] 98.5 F (36.9 C) (10/18 1050) Pulse Rate:  [76-94] 93 (10/18 1030) Resp:  [8-20] 20 (10/18 1007) BP: (97-128)/(59-88) 103/59 mmHg (10/18 1030) SpO2:  [90 %-100 %] 98 % (10/18 1050) Weight:  [140.615 kg (310 lb)] 140.615 kg (310 lb) (10/17 2311)    Intake/Output from previous day: 10/17 0701 - 10/18 0700 In: 1000 [I.V.:1000] Out: 400 [Urine:400] Intake/Output this shift: Total I/O In: -  Out: 350 [Urine:350]  General appearance: alert, cooperative and has multiple request. Head: Normocephalic, without obvious abnormality,  Eyes: dressing over right eye intact. Neurologic: Alert and oriented X 3, normal strength and tone. Normal symmetric reflexes. Normal coordination and gait  Lab Results:   Recent Labs  11/29/13 2300 11/29/13 2309 11/30/13 0327  WBC 7.2  --  6.7  HGB 11.2* 12.6 11.7*  HCT 35.0* 37.0 36.0  PLT 222  --  208    BMET  Recent Labs  11/29/13 2300 11/29/13 2309 11/30/13 0327  NA 140 141  --   K 3.9 3.7  --   CL 102 103  --   CO2 27  --   --   GLUCOSE 111* 116*  --   BUN 18 18  --   CREATININE 0.63 0.60 0.61  CALCIUM 9.0  --   --    PT/INR  Recent Labs  11/29/13 2300  LABPROT 14.3  INR 1.10    No results found for this basename: AST, ALT, ALKPHOS, BILITOT, PROT, ALBUMIN,  in the last 168 hours   Lipase  No results found for this basename: lipase     Studies/Results: Ct Head Wo Contrast  11/29/2013   CLINICAL DATA:  48 year old female post fall appear Right orbital injury with elevated right orbital pressure. Insert initial  EXAM: CT HEAD WITHOUT CONTRAST  CT MAXILLOFACIAL WITHOUT CONTRAST  CT CERVICAL SPINE WITHOUT CONTRAST  CT ORBITS WITHOUT CONTRAST  TECHNIQUE: Multidetector CT imaging of the  head, cervical spine, and maxillofacial structures were performed using the standard protocol without intravenous contrast. Multiplanar CT image reconstructions of the cervical spine and maxillofacial structures were also generated.  COMPARISON:  06/15/2013 and 03/05/2013 head CT.  FINDINGS: CT HEAD FINDINGS  Complex right orbital region fracture as discussed below. No intracranial hemorrhage.  No CT evidence of large acute infarct.  No hydrocephalus.  No intracranial mass lesion noted on this unenhanced exam.  CT MAXILLOFACIAL FINDINGS  Complex right orbital region fracture as discussed below.  CT CERVICAL SPINE FINDINGS  Mild motion degradation C5 through T2. Taking this mild limitation into account, no cervical spine fracture or malalignment noted.  No abnormal prevertebral soft tissue swelling.  CT ORBIT FINDINGS  Complex fracture right orbital region. This includes a comminuted inferiorly displaced right orbital floor fracture, fracture of the medial wall of the right orbit/ethmoid sinus air cells, fracture of the right nasal bone, right lacrimal duct bony landmarks and anterior wall of the right maxillary sinus. Inferior displaced right orbital floor fracture fragments. Rotation of fracture fragments at the level of the nasal lacrimal duct/ nasal bones/medial orbital floor/ethmoid sinus air cell fracture fragment.  Hemorrhage within the right maxillary sinus. Hemorrhage and subcutaneous emphysema inferior to the right inferior rectus muscle/ inferior oblique complex which is not entrapped within the  bony of fracture.  Exophthalmos with right preseptal hematoma with hematoma extending slightly in a postseptal position surrounding the insertion sites of the extra-ocular muscles. Right orbital emphysema. Elongated right optic nerve without discrete peri optic hematoma noted. No fracture at the level of the orbital apex causing optic nerve impingement detected.  Fluid within the right sphenoid sinus air cell. No  adjacent skull base fracture.  IMPRESSION: CT ORBIT/ MAXILLOFACIAL:  Complex fracture right orbital region. This includes a comminuted inferiorly displaced right orbital floor fracture, fracture of the medial wall of the right orbit/ethmoid sinus air cells, fracture of the right nasal bone, right lacrimal duct bony landmarks and anterior wall of the right maxillary sinus. Inferior displaced right orbital floor fracture fragments. Rotation of fracture fragments at the level of the nasal lacrimal duct/ nasal bones/medial orbital floor/ethmoid sinus air cell fracture fragments.  Hemorrhage within the right maxillary sinus. Hemorrhage and subcutaneous emphysema inferior to the right inferior rectus muscle/ inferior oblique complex which is not entrapped within the bony of fracture.  Exophthalmos with right preseptal hematoma with hematoma extending slightly in a postseptal position surrounding the insertion sites of the extra-ocular muscles. Right orbital emphysema. Elongated right optic nerve without discrete peri optic hematoma noted. No fracture at the level of the orbital apex causing optic nerve impingement detected.  No intracranial hemorrhage or evidence of cervical spine fracture. Cervical spine exam is slightly limited by motion degradation.  These results were called by telephone at the time of interpretation on 11/29/2013 at time of imaging to Dr. Blane OharaJOSHUA ZAVITZ , who verbally acknowledged these results.   Electronically Signed   By: Bridgett LarssonSteve  Olson M.D.   On: 11/29/2013 23:43   Ct Cervical Spine Wo Contrast  11/29/2013   CLINICAL DATA:  47Forty-six year old female post fall appear Right orbital injury with elevated right orbital pressure. Insert initial  EXAM: CT HEAD WITHOUT CONTRAST  CT MAXILLOFACIAL WITHOUT CONTRAST  CT CERVICAL SPINE WITHOUT CONTRAST  CT ORBITS WITHOUT CONTRAST  TECHNIQUE: Multidetector CT imaging of the head, cervical spine, and maxillofacial structures were performed using the standard  protocol without intravenous contrast. Multiplanar CT image reconstructions of the cervical spine and maxillofacial structures were also generated.  COMPARISON:  06/15/2013 and 03/05/2013 head CT.  FINDINGS: CT HEAD FINDINGS  Complex right orbital region fracture as discussed below. No intracranial hemorrhage.  No CT evidence of large acute infarct.  No hydrocephalus.  No intracranial mass lesion noted on this unenhanced exam.  CT MAXILLOFACIAL FINDINGS  Complex right orbital region fracture as discussed below.  CT CERVICAL SPINE FINDINGS  Mild motion degradation C5 through T2. Taking this mild limitation into account, no cervical spine fracture or malalignment noted.  No abnormal prevertebral soft tissue swelling.  CT ORBIT FINDINGS  Complex fracture right orbital region. This includes a comminuted inferiorly displaced right orbital floor fracture, fracture of the medial wall of the right orbit/ethmoid sinus air cells, fracture of the right nasal bone, right lacrimal duct bony landmarks and anterior wall of the right maxillary sinus. Inferior displaced right orbital floor fracture fragments. Rotation of fracture fragments at the level of the nasal lacrimal duct/ nasal bones/medial orbital floor/ethmoid sinus air cell fracture fragment.  Hemorrhage within the right maxillary sinus. Hemorrhage and subcutaneous emphysema inferior to the right inferior rectus muscle/ inferior oblique complex which is not entrapped within the bony of fracture.  Exophthalmos with right preseptal hematoma with hematoma extending slightly in a postseptal position surrounding the insertion sites of the extra-ocular  muscles. Right orbital emphysema. Elongated right optic nerve without discrete peri optic hematoma noted. No fracture at the level of the orbital apex causing optic nerve impingement detected.  Fluid within the right sphenoid sinus air cell. No adjacent skull base fracture.  IMPRESSION: CT ORBIT/ MAXILLOFACIAL:  Complex fracture  right orbital region. This includes a comminuted inferiorly displaced right orbital floor fracture, fracture of the medial wall of the right orbit/ethmoid sinus air cells, fracture of the right nasal bone, right lacrimal duct bony landmarks and anterior wall of the right maxillary sinus. Inferior displaced right orbital floor fracture fragments. Rotation of fracture fragments at the level of the nasal lacrimal duct/ nasal bones/medial orbital floor/ethmoid sinus air cell fracture fragments.  Hemorrhage within the right maxillary sinus. Hemorrhage and subcutaneous emphysema inferior to the right inferior rectus muscle/ inferior oblique complex which is not entrapped within the bony of fracture.  Exophthalmos with right preseptal hematoma with hematoma extending slightly in a postseptal position surrounding the insertion sites of the extra-ocular muscles. Right orbital emphysema. Elongated right optic nerve without discrete peri optic hematoma noted. No fracture at the level of the orbital apex causing optic nerve impingement detected.  No intracranial hemorrhage or evidence of cervical spine fracture. Cervical spine exam is slightly limited by motion degradation.  These results were called by telephone at the time of interpretation on 11/29/2013 at time of imaging to Dr. Blane Ohara , who verbally acknowledged these results.   Electronically Signed   By: Bridgett Larsson M.D.   On: 11/29/2013 23:43   Dg Chest Port 1 View  11/29/2013   CLINICAL DATA:  Level 2 trauma from fall.  Initial encounter  EXAM: PORTABLE CHEST - 1 VIEW  COMPARISON:  09/16/2011  FINDINGS: Mild cardiomegaly, accentuated by technique. Aortic contours appear similar to 2013. Right perihilar bandlike opacity. No evidence of hemothorax, pneumothorax, or lung contusion. No fracture appreciated.  IMPRESSION: 1. No definite injury. Sensitivity is decreased by low lung volumes, if CT imaging is not planned recommend repeat upright chest radiography. 2.  Mild right perihilar atelectasis.   Electronically Signed   By: Tiburcio Pea M.D.   On: 11/29/2013 23:49   Ct Maxillofacial Wo Cm  11/29/2013   CLINICAL DATA:  48 year old female post fall appear Right orbital injury with elevated right orbital pressure. Insert initial  EXAM: CT HEAD WITHOUT CONTRAST  CT MAXILLOFACIAL WITHOUT CONTRAST  CT CERVICAL SPINE WITHOUT CONTRAST  CT ORBITS WITHOUT CONTRAST  TECHNIQUE: Multidetector CT imaging of the head, cervical spine, and maxillofacial structures were performed using the standard protocol without intravenous contrast. Multiplanar CT image reconstructions of the cervical spine and maxillofacial structures were also generated.  COMPARISON:  06/15/2013 and 03/05/2013 head CT.  FINDINGS: CT HEAD FINDINGS  Complex right orbital region fracture as discussed below. No intracranial hemorrhage.  No CT evidence of large acute infarct.  No hydrocephalus.  No intracranial mass lesion noted on this unenhanced exam.  CT MAXILLOFACIAL FINDINGS  Complex right orbital region fracture as discussed below.  CT CERVICAL SPINE FINDINGS  Mild motion degradation C5 through T2. Taking this mild limitation into account, no cervical spine fracture or malalignment noted.  No abnormal prevertebral soft tissue swelling.  CT ORBIT FINDINGS  Complex fracture right orbital region. This includes a comminuted inferiorly displaced right orbital floor fracture, fracture of the medial wall of the right orbit/ethmoid sinus air cells, fracture of the right nasal bone, right lacrimal duct bony landmarks and anterior wall of the right  maxillary sinus. Inferior displaced right orbital floor fracture fragments. Rotation of fracture fragments at the level of the nasal lacrimal duct/ nasal bones/medial orbital floor/ethmoid sinus air cell fracture fragment.  Hemorrhage within the right maxillary sinus. Hemorrhage and subcutaneous emphysema inferior to the right inferior rectus muscle/ inferior oblique  complex which is not entrapped within the bony of fracture.  Exophthalmos with right preseptal hematoma with hematoma extending slightly in a postseptal position surrounding the insertion sites of the extra-ocular muscles. Right orbital emphysema. Elongated right optic nerve without discrete peri optic hematoma noted. No fracture at the level of the orbital apex causing optic nerve impingement detected.  Fluid within the right sphenoid sinus air cell. No adjacent skull base fracture.  IMPRESSION: CT ORBIT/ MAXILLOFACIAL:  Complex fracture right orbital region. This includes a comminuted inferiorly displaced right orbital floor fracture, fracture of the medial wall of the right orbit/ethmoid sinus air cells, fracture of the right nasal bone, right lacrimal duct bony landmarks and anterior wall of the right maxillary sinus. Inferior displaced right orbital floor fracture fragments. Rotation of fracture fragments at the level of the nasal lacrimal duct/ nasal bones/medial orbital floor/ethmoid sinus air cell fracture fragments.  Hemorrhage within the right maxillary sinus. Hemorrhage and subcutaneous emphysema inferior to the right inferior rectus muscle/ inferior oblique complex which is not entrapped within the bony of fracture.  Exophthalmos with right preseptal hematoma with hematoma extending slightly in a postseptal position surrounding the insertion sites of the extra-ocular muscles. Right orbital emphysema. Elongated right optic nerve without discrete peri optic hematoma noted. No fracture at the level of the orbital apex causing optic nerve impingement detected.  No intracranial hemorrhage or evidence of cervical spine fracture. Cervical spine exam is slightly limited by motion degradation.  These results were called by telephone at the time of interpretation on 11/29/2013 at time of imaging to Dr. Blane Ohara , who verbally acknowledged these results.   Electronically Signed   By: Bridgett Larsson M.D.   On:  11/29/2013 23:43   Ct Orbitss W/o Cm  11/29/2013   CLINICAL DATA:  48 year old female post fall appear Right orbital injury with elevated right orbital pressure. Insert initial  EXAM: CT HEAD WITHOUT CONTRAST  CT MAXILLOFACIAL WITHOUT CONTRAST  CT CERVICAL SPINE WITHOUT CONTRAST  CT ORBITS WITHOUT CONTRAST  TECHNIQUE: Multidetector CT imaging of the head, cervical spine, and maxillofacial structures were performed using the standard protocol without intravenous contrast. Multiplanar CT image reconstructions of the cervical spine and maxillofacial structures were also generated.  COMPARISON:  06/15/2013 and 03/05/2013 head CT.  FINDINGS: CT HEAD FINDINGS  Complex right orbital region fracture as discussed below. No intracranial hemorrhage.  No CT evidence of large acute infarct.  No hydrocephalus.  No intracranial mass lesion noted on this unenhanced exam.  CT MAXILLOFACIAL FINDINGS  Complex right orbital region fracture as discussed below.  CT CERVICAL SPINE FINDINGS  Mild motion degradation C5 through T2. Taking this mild limitation into account, no cervical spine fracture or malalignment noted.  No abnormal prevertebral soft tissue swelling.  CT ORBIT FINDINGS  Complex fracture right orbital region. This includes a comminuted inferiorly displaced right orbital floor fracture, fracture of the medial wall of the right orbit/ethmoid sinus air cells, fracture of the right nasal bone, right lacrimal duct bony landmarks and anterior wall of the right maxillary sinus. Inferior displaced right orbital floor fracture fragments. Rotation of fracture fragments at the level of the nasal lacrimal duct/ nasal bones/medial orbital floor/ethmoid  sinus air cell fracture fragment.  Hemorrhage within the right maxillary sinus. Hemorrhage and subcutaneous emphysema inferior to the right inferior rectus muscle/ inferior oblique complex which is not entrapped within the bony of fracture.  Exophthalmos with right preseptal  hematoma with hematoma extending slightly in a postseptal position surrounding the insertion sites of the extra-ocular muscles. Right orbital emphysema. Elongated right optic nerve without discrete peri optic hematoma noted. No fracture at the level of the orbital apex causing optic nerve impingement detected.  Fluid within the right sphenoid sinus air cell. No adjacent skull base fracture.  IMPRESSION: CT ORBIT/ MAXILLOFACIAL:  Complex fracture right orbital region. This includes a comminuted inferiorly displaced right orbital floor fracture, fracture of the medial wall of the right orbit/ethmoid sinus air cells, fracture of the right nasal bone, right lacrimal duct bony landmarks and anterior wall of the right maxillary sinus. Inferior displaced right orbital floor fracture fragments. Rotation of fracture fragments at the level of the nasal lacrimal duct/ nasal bones/medial orbital floor/ethmoid sinus air cell fracture fragments.  Hemorrhage within the right maxillary sinus. Hemorrhage and subcutaneous emphysema inferior to the right inferior rectus muscle/ inferior oblique complex which is not entrapped within the bony of fracture.  Exophthalmos with right preseptal hematoma with hematoma extending slightly in a postseptal position surrounding the insertion sites of the extra-ocular muscles. Right orbital emphysema. Elongated right optic nerve without discrete peri optic hematoma noted. No fracture at the level of the orbital apex causing optic nerve impingement detected.  No intracranial hemorrhage or evidence of cervical spine fracture. Cervical spine exam is slightly limited by motion degradation.  These results were called by telephone at the time of interpretation on 11/29/2013 at time of imaging to Dr. Blane Ohara , who verbally acknowledged these results.   Electronically Signed   By: Bridgett Larsson M.D.   On: 11/29/2013 23:43    Medications: . DULoxetine  60 mg Oral Daily  . [START ON 12/01/2013]  enoxaparin (LOVENOX) injection  40 mg Subcutaneous Q24H  . [START ON 12/01/2013] gabapentin  600 mg Oral QODAY  . levothyroxine  50 mcg Oral QAC breakfast  . pantoprazole  40 mg Oral Daily   Or  . pantoprazole (PROTONIX) IV  40 mg Intravenous Daily  . triamcinolone cream  1 application Topical TID   . dextrose 5 % and 0.45 % NaCl with KCl 20 mEq/L 125 mL/hr at 11/30/13 0536   Prior to Admission medications   Medication Sig Start Date End Date Taking? Authorizing Provider  butalbital-acetaminophen-caffeine (FIORICET, ESGIC) 50-325-40 MG per tablet Take 1 tablet by mouth every 6 (six) hours as needed for headache.     Historical Provider, MD  cholecalciferol (VITAMIN D) 1000 UNITS tablet Take 1 tablet (1,000 Units total) by mouth daily. 09/25/13   Beau Fanny, FNP  Cholecalciferol (VITAMIN D-3) 5000 UNITS TABS Take 5,000 Units by mouth daily.    Historical Provider, MD  diphenhydrAMINE (SOMINEX) 25 MG tablet Take 25 mg by mouth at bedtime as needed for itching or sleep.    Historical Provider, MD  doxycycline (VIBRA-TABS) 100 MG tablet Take 1 tablet (100 mg total) by mouth 2 (two) times daily. 10/06/13   Reuben Likes, MD  DULoxetine (CYMBALTA) 60 MG capsule Take 60 mg by mouth daily.    Historical Provider, MD  gabapentin (NEURONTIN) 600 MG tablet Take 600 mg by mouth every other day. 09/25/13   Beau Fanny, FNP  hydrOXYzine (ATARAX/VISTARIL) 25 MG tablet Take 1 tablet (  25 mg total) by mouth every 6 (six) hours as needed for itching. 10/06/13   Reuben Likesavid C Keller, MD  levothyroxine (SYNTHROID, LEVOTHROID) 50 MCG tablet Take 50 mcg by mouth daily.    Historical Provider, MD  mupirocin ointment (BACTROBAN) 2 % Apply to infected area on right knee 3 times daily 10/06/13   Reuben Likesavid C Keller, MD  pravastatin (PRAVACHOL) 40 MG tablet Take 40 mg by mouth daily.    Historical Provider, MD  triamcinolone cream (KENALOG) 0.1 % Apply 1 application topically 3 (three) times daily. 10/06/13   Reuben Likesavid C Keller, MD     Assessment/Plan Fall Right Inferior orbital fracture medial orbit fracture, posible nasolacrimal  Disruption, nasal fracture Bipolar 1 disorder  Depression Migraines Hypothyroid Back/disc disease Hypertension Body mass index is 50.06 kg/(m^2).    Plan:   Clear liquids for now.  NPO after MN. Ihave increased her oxycodone, restarted her Neurontin, she is on cymbalta, restarted kenalog to knee, vistaril.  I have given her a lower dose of xanax and ask pharmacy to confirm home dose.  LOS: 1 day    Icess Bertoni 11/30/2013

## 2013-11-30 NOTE — Progress Notes (Signed)
Agree with above 

## 2013-11-30 NOTE — ED Provider Notes (Signed)
Medical screening examination/treatment/procedure(s) were conducted as a shared visit with non-physician practitioner(s) or resident and myself. I personally evaluated the patient during the encounter and agree with the findings.  I have personally reviewed any xrays and/ or EKG's with the provider and I agree with interpretation.  Patient with history of bipolar presents by EMS and police with facial trauma and altered mental status. Patient had unwitnessed fall with multiple facial injuries and bleeding, unknown why she fell. Initially the physician assistant saw the patient and noticed dilated right pupil and somnolence. I immediately assessed the patient and found she had general altered mental status, had intermittent speech to loud verbal and painful stimuli, significantly dilated right pupil minimally responsive, normal appearing left pupil, blood and swelling to right periorbital region, laceration right intraocular region, mild bleeding lateral and medial aspect on the right. Patient moving all extremities grossly equal. Concern for significant ocular injury and possibly intracranial bleeding with altered mental status and dilated right pupil. Pressure in the right eye was consistently around 40, left eye less than 10 on multiple checks. With decreased vision, clinical concern for possible permanent vision loss, intraocular pressure elevation, clinical swelling and concern for hematoma lateral canthotomy was performed by myself and the resident at the bedside. Discussed CT results on the phone with radiology. Repeat intraocular pressure 20. Discussed the case with ophthalmology who will see patient later today. Discuss case with trauma who came down to assist with further workup and admission.  On recheck patient still altered, however and protecting airway we'll hold on intubation at this time.  CRITICAL CARE Performed by: Enid SkeensZAVITZ, Labresha Mellor M   Total critical care time: 35 min  Critical care time  was exclusive of separately billable procedures and treating other patients.  Critical care was necessary to treat or prevent imminent or life-threatening deterioration.  Critical care was time spent personally by me on the following activities: development of treatment plan with patient and/or surrogate as well as nursing, discussions with consultants, evaluation of patient's response to treatment, examination of patient, obtaining history from patient or surrogate, ordering and performing treatments and interventions, ordering and review of laboratory studies, ordering and review of radiographic studies, pulse oximetry and re-evaluation of patient's condition.  I was present for lateral canthotomy  Indication Increased IOP, decreased vision, trauma  Lidocaine used prior to procedure, incision to right lateral canthus  IOP improved afterwards.  The patients results and plan were reviewed and discussed.  Any x-rays performed were personally reviewed by myself.  Differential diagnosis were considered with the presenting HPI.  Medications   lidocaine (PF) (XYLOCAINE) 1 % injection 5 mL (not administered)   lidocaine (cardiac) 100 mg/275ml (XYLOCAINE) 20 MG/ML injection 2% (not administered)   succinylcholine (ANECTINE) 20 MG/ML injection (not administered)   rocuronium (ZEMURON) 50 MG/5ML injection (not administered)   etomidate (AMIDATE) 2 MG/ML injection (not administered)   tetracaine (PONTOCAINE) 0.5 % ophthalmic solution 1 drop (1 drop Right Eye Given 11/29/13 2300)   sodium chloride 0.9 % bolus 1,000 mL (0 mLs Intravenous Stopped 11/29/13 2359)    Filed Vitals:    11/29/13 2300  11/29/13 2311  11/29/13 2330  11/29/13 2345   BP:  120/81   117/77  116/75   Pulse:  81   81  83   Temp:  95.9 F (35.5 C)      TempSrc:  Rectal      Resp:  11   16  9    Height:   5\' 6"  (  1.676 m)     Weight:   310 lb (140.615 kg)     SpO2:  90%   94%  94%    Final diagnoses:   Fall   Proptosis    Fracture of orbital floor, blow-out, right, closed, initial encounter   Altered mental status, unspecified altered mental status type   Nasal bone fracture, open, initial encounter   Increased intraocular pressure, right   Medial orbital wall fracture, closed, initial encounter    Admission/ observation were discussed with the admitting physician, patient and/or family and they are comfortable with the plan.    Enid SkeensJoshua M Elandra Powell, MD 11/30/13 [phone]

## 2013-11-30 NOTE — ED Notes (Signed)
Simonne ComeLeo, SW, in w/pt.

## 2013-11-30 NOTE — ED Notes (Signed)
Plastic Surgeon in w/pt.

## 2013-11-30 NOTE — Consult Note (Signed)
Reason for Consult:facial trauma Referring Physician: Dr. Janee Mornhompson Date 10.18.2015 Location: [place]-inpatient  Brittany [name] is an 48 y.o. female.  HPI: Upgraded to Level II trauma after presentation to ED fall from standing. Underwent emergent lateral canthotomy due to proptosis on right with minimally responsive pupil, elevated IOC. History significant for Bipolar I with previous evaluations in system for drug use.   Past Medical History  Diagnosis Date  . Hypertension   . Bipolar 1 disorder   . Anxiety   . Back pain   . Bulging disc   . Ruptured intervertebral disc   . Migraine   . Head injury   . Short-term memory loss   . Depression   . Hypothyroidism   . Goiter     Past Surgical History  Procedure Laterality Date  . Abdominal hysterectomy      Family History  Problem Relation Age of Onset  . Migraines Mother   . Migraines Father   . Other Father   . Migraines Sister     Social History:  reports that she has never smoked. She has never used smokeless tobacco. She reports that she does not drink alcohol or use illicit drugs.  Allergies:  Allergies  Allergen Reactions  . Toradol [Ketorolac Tromethamine] Swelling  . Trazodone And Nefazodone     Bladder infection    Medications: I have reviewed the patient's current medications.  Ct Maxillofacial Wo Cm  11/29/2013   CLINICAL DATA:  51Forty-six year old female post fall appear Right orbital injury with elevated right orbital pressure. Insert initial  EXAM: CT HEAD WITHOUT CONTRAST  CT MAXILLOFACIAL WITHOUT CONTRAST  CT CERVICAL SPINE WITHOUT CONTRAST  CT ORBITS WITHOUT CONTRAST  TECHNIQUE: Multidetector CT imaging of the head, cervical spine, and maxillofacial structures were performed using the standard protocol without intravenous contrast. Multiplanar CT image reconstructions of the cervical spine and maxillofacial structures were also generated.  COMPARISON:  06/15/2013 and 03/05/2013 head CT.  FINDINGS: CT  HEAD FINDINGS  Complex right orbital region fracture as discussed below. No intracranial hemorrhage.  No CT evidence of large acute infarct.  No hydrocephalus.  No intracranial mass lesion noted on this unenhanced exam.  CT MAXILLOFACIAL FINDINGS  Complex right orbital region fracture as discussed below.  CT CERVICAL SPINE FINDINGS  Mild motion degradation C5 through T2. Taking this mild limitation into account, no cervical spine fracture or malalignment noted.  No abnormal prevertebral soft tissue swelling.  CT ORBIT FINDINGS  Complex fracture right orbital region. This includes a comminuted inferiorly displaced right orbital floor fracture, fracture of the medial wall of the right orbit/ethmoid sinus air cells, fracture of the right nasal bone, right lacrimal duct bony landmarks and anterior wall of the right maxillary sinus. Inferior displaced right orbital floor fracture fragments. Rotation of fracture fragments at the level of the nasal lacrimal duct/ nasal bones/medial orbital floor/ethmoid sinus air cell fracture fragment.  Hemorrhage within the right maxillary sinus. Hemorrhage and subcutaneous emphysema inferior to the right inferior rectus muscle/ inferior oblique complex which is not entrapped within the bony of fracture.  Exophthalmos with right preseptal hematoma with hematoma extending slightly in a postseptal position surrounding the insertion sites of the extra-ocular muscles. Right orbital emphysema. Elongated right optic nerve without discrete peri optic hematoma noted. No fracture at the level of the orbital apex causing optic nerve impingement detected.  Fluid within the right sphenoid sinus air cell. No adjacent skull base fracture.  IMPRESSION: CT ORBIT/ MAXILLOFACIAL:  Complex fracture right  orbital region. This includes a comminuted inferiorly displaced right orbital floor fracture, fracture of the medial wall of the right orbit/ethmoid sinus air cells, fracture of the right nasal bone, right  lacrimal duct bony landmarks and anterior wall of the right maxillary sinus. Inferior displaced right orbital floor fracture fragments. Rotation of fracture fragments at the level of the nasal lacrimal duct/ nasal bones/medial orbital floor/ethmoid sinus air cell fracture fragments.  Hemorrhage within the right maxillary sinus. Hemorrhage and subcutaneous emphysema inferior to the right inferior rectus muscle/ inferior oblique complex which is not entrapped within the bony of fracture.  Exophthalmos with right preseptal hematoma with hematoma extending slightly in a postseptal position surrounding the insertion sites of the extra-ocular muscles. Right orbital emphysema. Elongated right optic nerve without discrete peri optic hematoma noted. No fracture at the level of the orbital apex causing optic nerve impingement detected.  No intracranial hemorrhage or evidence of cervical spine fracture. Cervical spine exam is slightly limited by motion degradation.  These results were called by telephone at the time of interpretation on 11/29/2013 at time of imaging to Dr. Blane OharaJOSHUA ZAVITZ , who verbally acknowledged these results.   Electronically Signed   By: Bridgett LarssonSteve  Olson M.D.   On: 11/29/2013 23:43    ROS Blood pressure 128/74, pulse 94, temperature 95.9 F (35.5 C), temperature source Rectal, resp. rate 18, height 5\' 6"  (1.676 m), weight 140.615 kg (310 lb), SpO2 100.00%. Physical Exam Sleeping, easily arousable HEENT: right periorbital edema ecchymoses Pupil 5 to 3 R  , on left 4 to 2 Restricted eye movement on right Difficult to understand but appropriate questions TM clear Nose dorsum straight, no septal hematoma  Assessment/Plan: Reviewed CT. Inferior orbital rim depressed, orbital floor and medial orbit fracture, possible disruption nasolacrimal system.  Posterior orbital floor with displaced fragments. Nasal bone fracture on right with displacement at level of piriform rim. Will need to reexamine once  swelling decreased and more alert. May need ORIF oribital rim and treatment orbital floor. Ophthalmology evaluation pending.  Glenna FellowsBrinda Malinda Mayden, MD [facility] Re[facility] [phone]

## 2013-11-30 NOTE — Consult Note (Signed)
Reason for Consult:blunto trauma to right side of face after falling Referring Physician: emergency department  Brittany [name] is an 48 y.o. female.  HPI: pt admitted to [facility] via ED after falling when trying to manage her small dog. Her head hit concrete. She denied any abuse.   Past Medical History  Diagnosis Date  . Hypertension   . Bipolar 1 disorder   . Anxiety   . Back pain   . Bulging disc   . Ruptured intervertebral disc   . Migraine   . Head injury   . Short-term memory loss   . Depression   . Hypothyroidism   . Goiter     Past Surgical History  Procedure Laterality Date  . Abdominal hysterectomy      Family History  Problem Relation Age of Onset  . Migraines Mother   . Migraines Father   . Other Father   . Migraines Sister     Social History:  reports that she has never smoked. She has never used smokeless tobacco. She reports that she does not drink alcohol or use illicit drugs.  Allergies:  Allergies  Allergen Reactions  . Toradol [Ketorolac Tromethamine] Swelling  . Trazodone And Nefazodone Other (See Comments)    Bladder infection    Medications: I have reviewed the patient's current medications.  Results for orders placed during the hospital encounter of 11/29/13 (from the past 48 hour(s))  CBC WITH DIFFERENTIAL     Status: Abnormal   Collection Time    11/29/13 11:00 PM      Result Value Ref Range   WBC 7.2  4.0 - 10.5 K/uL   RBC 3.70 (*) 3.87 - 5.11 MIL/uL   Hemoglobin 11.2 (*) 12.0 - 15.0 g/dL   HCT 35.0 (*) 36.0 - 46.0 %   MCV 94.6  78.0 - 100.0 fL   MCH 30.3  26.0 - 34.0 pg   MCHC 32.0  30.0 - 36.0 g/dL   RDW 13.5  11.5 - 15.5 %   Platelets 222  150 - 400 K/uL   Neutrophils Relative % 75  43 - 77 %   Neutro Abs 5.3  1.7 - 7.7 K/uL   Lymphocytes Relative 16  12 - 46 %   Lymphs Abs 1.2  0.7 - 4.0 K/uL   Monocytes Relative 7  3 - 12 %   Monocytes Absolute 0.5  0.1 - 1.0 K/uL   Eosinophils Relative 2  0 - 5 %   Eosinophils Absolute  0.1  0.0 - 0.7 K/uL   Basophils Relative 0  0 - 1 %   Basophils Absolute 0.0  0.0 - 0.1 K/uL  BASIC METABOLIC PANEL     Status: Abnormal   Collection Time    11/29/13 11:00 PM      Result Value Ref Range   Sodium 140  137 - 147 mEq/L   Potassium 3.9  3.7 - 5.3 mEq/L   Chloride 102  96 - 112 mEq/L   CO2 27  19 - 32 mEq/L   Glucose, Bld 111 (*) 70 - 99 mg/dL   BUN 18  6 - 23 mg/dL   Creatinine, Ser 0.63  0.50 - 1.10 mg/dL   Calcium 9.0  8.4 - 10.5 mg/dL   GFR calc non Af Amer >90  >90 mL/min   GFR calc Af Amer >90  >90 mL/min   Comment: (NOTE)     The eGFR has been calculated using the CKD EPI equation.  This calculation has not been validated in all clinical situations.     eGFR's persistently <90 mL/min signify possible Chronic Kidney     Disease.   Anion gap 11  5 - 15  ETHANOL     Status: None   Collection Time    11/29/13 11:00 PM      Result Value Ref Range   Alcohol, Ethyl (B) <11  0 - 11 mg/dL   Comment:            LOWEST DETECTABLE LIMIT FOR     SERUM ALCOHOL IS 11 mg/dL     FOR MEDICAL PURPOSES ONLY  ACETAMINOPHEN LEVEL     Status: None   Collection Time    11/29/13 11:00 PM      Result Value Ref Range   Acetaminophen (Tylenol), Serum <15.0  10 - 30 ug/mL   Comment:            THERAPEUTIC CONCENTRATIONS VARY     SIGNIFICANTLY. A RANGE OF 10-30     ug/mL MAY BE AN EFFECTIVE     CONCENTRATION FOR MANY PATIENTS.     HOWEVER, SOME ARE BEST TREATED     AT CONCENTRATIONS OUTSIDE THIS     RANGE.     ACETAMINOPHEN CONCENTRATIONS     >150 ug/mL AT 4 HOURS AFTER     INGESTION AND >50 ug/mL AT 12     HOURS AFTER INGESTION ARE     OFTEN ASSOCIATED WITH TOXIC     REACTIONS.  SALICYLATE LEVEL     Status: Abnormal   Collection Time    11/29/13 11:00 PM      Result Value Ref Range   Salicylate Lvl <5.6 (*) 2.8 - 20.0 mg/dL  PROTIME-INR     Status: None   Collection Time    11/29/13 11:00 PM      Result Value Ref Range   Prothrombin Time 14.3  11.6 - 15.2 seconds    INR 1.10  0.00 - 1.49  TYPE AND SCREEN     Status: None   Collection Time    11/29/13 11:00 PM      Result Value Ref Range   ABO/RH(D) A POS     Antibody Screen NEG     Sample Expiration 12/02/2013    ABO/RH     Status: None   Collection Time    11/29/13 11:00 PM      Result Value Ref Range   ABO/RH(D) A POS    CDS SEROLOGY     Status: None   Collection Time    11/29/13 11:01 PM      Result Value Ref Range   CDS serology specimen       Value: SPECIMEN WILL BE HELD FOR 14 DAYS IF TESTING IS REQUIRED  I-STAT CHEM 8, ED     Status: Abnormal   Collection Time    11/29/13 11:09 PM      Result Value Ref Range   Sodium 141  137 - 147 mEq/L   Potassium 3.7  3.7 - 5.3 mEq/L   Chloride 103  96 - 112 mEq/L   BUN 18  6 - 23 mg/dL   Creatinine, Ser 0.60  0.50 - 1.10 mg/dL   Glucose, Bld 116 (*) 70 - 99 mg/dL   Calcium, Ion 1.13  1.12 - 1.23 mmol/L   TCO2 26  0 - 100 mmol/L   Hemoglobin 12.6  12.0 - 15.0 g/dL   HCT 37.0  36.0 -  46.0 %  URINE RAPID DRUG SCREEN (HOSP PERFORMED)     Status: Abnormal   Collection Time    11/30/13 12:09 AM      Result Value Ref Range   Opiates NONE DETECTED  NONE DETECTED   Cocaine NONE DETECTED  NONE DETECTED   Benzodiazepines POSITIVE (*) NONE DETECTED   Amphetamines NONE DETECTED  NONE DETECTED   Tetrahydrocannabinol NONE DETECTED  NONE DETECTED   Barbiturates POSITIVE (*) NONE DETECTED   Comment:            DRUG SCREEN FOR MEDICAL PURPOSES     ONLY.  IF CONFIRMATION IS NEEDED     FOR ANY PURPOSE, NOTIFY LAB     WITHIN 5 DAYS.                LOWEST DETECTABLE LIMITS     FOR URINE DRUG SCREEN     Drug Class       Cutoff (ng/mL)     Amphetamine      1000     Barbiturate      200     Benzodiazepine   206     Tricyclics       015     Opiates          300     Cocaine          300     THC              50  CBC     Status: Abnormal   Collection Time    11/30/13  3:27 AM      Result Value Ref Range   WBC 6.7  4.0 - 10.5 K/uL   RBC 3.75 (*)  3.87 - 5.11 MIL/uL   Hemoglobin 11.7 (*) 12.0 - 15.0 g/dL   HCT 36.0  36.0 - 46.0 %   MCV 96.0  78.0 - 100.0 fL   MCH 31.2  26.0 - 34.0 pg   MCHC 32.5  30.0 - 36.0 g/dL   RDW 13.6  11.5 - 15.5 %   Platelets 208  150 - 400 K/uL  CREATININE, SERUM     Status: None   Collection Time    11/30/13  3:27 AM      Result Value Ref Range   Creatinine, Ser 0.61  0.50 - 1.10 mg/dL   GFR calc non Af Amer >90  >90 mL/min   GFR calc Af Amer >90  >90 mL/min   Comment: (NOTE)     The eGFR has been calculated using the CKD EPI equation.     This calculation has not been validated in all clinical situations.     eGFR's persistently <90 mL/min signify possible Chronic Kidney     Disease.  MRSA PCR SCREENING     Status: None   Collection Time    11/30/13 11:49 AM      Result Value Ref Range   MRSA by PCR NEGATIVE  NEGATIVE   Comment:            The GeneXpert MRSA Assay (FDA     approved for NASAL specimens     only), is one component of a     comprehensive MRSA colonization     surveillance program. It is not     intended to diagnose MRSA     infection nor to guide or     monitor treatment for     MRSA infections.  CBC  Status: Abnormal   Collection Time    11/30/13 12:59 PM      Result Value Ref Range   WBC 6.7  4.0 - 10.5 K/uL   RBC 3.32 (*) 3.87 - 5.11 MIL/uL   Hemoglobin 10.3 (*) 12.0 - 15.0 g/dL   HCT 31.6 (*) 36.0 - 46.0 %   MCV 95.2  78.0 - 100.0 fL   MCH 31.0  26.0 - 34.0 pg   MCHC 32.6  30.0 - 36.0 g/dL   RDW 13.6  11.5 - 15.5 %   Platelets 196  150 - 400 K/uL  BASIC METABOLIC PANEL     Status: Abnormal   Collection Time    11/30/13 12:59 PM      Result Value Ref Range   Sodium 138  137 - 147 mEq/L   Potassium 3.9  3.7 - 5.3 mEq/L   Chloride 103  96 - 112 mEq/L   CO2 25  19 - 32 mEq/L   Glucose, Bld 112 (*) 70 - 99 mg/dL   BUN 11  6 - 23 mg/dL   Creatinine, Ser 0.62  0.50 - 1.10 mg/dL   Calcium 8.3 (*) 8.4 - 10.5 mg/dL   GFR calc non Af Amer >90  >90 mL/min   GFR  calc Af Amer >90  >90 mL/min   Comment: (NOTE)     The eGFR has been calculated using the CKD EPI equation.     This calculation has not been validated in all clinical situations.     eGFR's persistently <90 mL/min signify possible Chronic Kidney     Disease.   Anion gap 10  5 - 15    Ct Head Wo Contrast  11/29/2013   CLINICAL DATA:  48 year old female post fall appear Right orbital injury with elevated right orbital pressure. Insert initial  EXAM: CT HEAD WITHOUT CONTRAST  CT MAXILLOFACIAL WITHOUT CONTRAST  CT CERVICAL SPINE WITHOUT CONTRAST  CT ORBITS WITHOUT CONTRAST  TECHNIQUE: Multidetector CT imaging of the head, cervical spine, and maxillofacial structures were performed using the standard protocol without intravenous contrast. Multiplanar CT image reconstructions of the cervical spine and maxillofacial structures were also generated.  COMPARISON:  06/15/2013 and 03/05/2013 head CT.  FINDINGS: CT HEAD FINDINGS  Complex right orbital region fracture as discussed below. No intracranial hemorrhage.  No CT evidence of large acute infarct.  No hydrocephalus.  No intracranial mass lesion noted on this unenhanced exam.  CT MAXILLOFACIAL FINDINGS  Complex right orbital region fracture as discussed below.  CT CERVICAL SPINE FINDINGS  Mild motion degradation C5 through T2. Taking this mild limitation into account, no cervical spine fracture or malalignment noted.  No abnormal prevertebral soft tissue swelling.  CT ORBIT FINDINGS  Complex fracture right orbital region. This includes a comminuted inferiorly displaced right orbital floor fracture, fracture of the medial wall of the right orbit/ethmoid sinus air cells, fracture of the right nasal bone, right lacrimal duct bony landmarks and anterior wall of the right maxillary sinus. Inferior displaced right orbital floor fracture fragments. Rotation of fracture fragments at the level of the nasal lacrimal duct/ nasal bones/medial orbital floor/ethmoid  sinus air cell fracture fragment.  Hemorrhage within the right maxillary sinus. Hemorrhage and subcutaneous emphysema inferior to the right inferior rectus muscle/ inferior oblique complex which is not entrapped within the bony of fracture.  Exophthalmos with right preseptal hematoma with hematoma extending slightly in a postseptal position surrounding the insertion sites of the extra-ocular muscles. Right orbital emphysema.  Elongated right optic nerve without discrete peri optic hematoma noted. No fracture at the level of the orbital apex causing optic nerve impingement detected.  Fluid within the right sphenoid sinus air cell. No adjacent skull base fracture.  IMPRESSION: CT ORBIT/ MAXILLOFACIAL:  Complex fracture right orbital region. This includes a comminuted inferiorly displaced right orbital floor fracture, fracture of the medial wall of the right orbit/ethmoid sinus air cells, fracture of the right nasal bone, right lacrimal duct bony landmarks and anterior wall of the right maxillary sinus. Inferior displaced right orbital floor fracture fragments. Rotation of fracture fragments at the level of the nasal lacrimal duct/ nasal bones/medial orbital floor/ethmoid sinus air cell fracture fragments.  Hemorrhage within the right maxillary sinus. Hemorrhage and subcutaneous emphysema inferior to the right inferior rectus muscle/ inferior oblique complex which is not entrapped within the bony of fracture.  Exophthalmos with right preseptal hematoma with hematoma extending slightly in a postseptal position surrounding the insertion sites of the extra-ocular muscles. Right orbital emphysema. Elongated right optic nerve without discrete peri optic hematoma noted. No fracture at the level of the orbital apex causing optic nerve impingement detected.  No intracranial hemorrhage or evidence of cervical spine fracture. Cervical spine exam is slightly limited by motion degradation.  These results were called by telephone at  the time of interpretation on 11/29/2013 at time of imaging to Dr. Elnora Morrison , who verbally acknowledged these results.   Electronically Signed   By: Chauncey Cruel M.D.   On: 11/29/2013 23:43   Ct Cervical Spine Wo Contrast  11/29/2013   CLINICAL DATA:  48 year old female post fall appear Right orbital injury with elevated right orbital pressure. Insert initial  EXAM: CT HEAD WITHOUT CONTRAST  CT MAXILLOFACIAL WITHOUT CONTRAST  CT CERVICAL SPINE WITHOUT CONTRAST  CT ORBITS WITHOUT CONTRAST  TECHNIQUE: Multidetector CT imaging of the head, cervical spine, and maxillofacial structures were performed using the standard protocol without intravenous contrast. Multiplanar CT image reconstructions of the cervical spine and maxillofacial structures were also generated.  COMPARISON:  06/15/2013 and 03/05/2013 head CT.  FINDINGS: CT HEAD FINDINGS  Complex right orbital region fracture as discussed below. No intracranial hemorrhage.  No CT evidence of large acute infarct.  No hydrocephalus.  No intracranial mass lesion noted on this unenhanced exam.  CT MAXILLOFACIAL FINDINGS  Complex right orbital region fracture as discussed below.  CT CERVICAL SPINE FINDINGS  Mild motion degradation C5 through T2. Taking this mild limitation into account, no cervical spine fracture or malalignment noted.  No abnormal prevertebral soft tissue swelling.  CT ORBIT FINDINGS  Complex fracture right orbital region. This includes a comminuted inferiorly displaced right orbital floor fracture, fracture of the medial wall of the right orbit/ethmoid sinus air cells, fracture of the right nasal bone, right lacrimal duct bony landmarks and anterior wall of the right maxillary sinus. Inferior displaced right orbital floor fracture fragments. Rotation of fracture fragments at the level of the nasal lacrimal duct/ nasal bones/medial orbital floor/ethmoid sinus air cell fracture fragment.  Hemorrhage within the right maxillary sinus.  Hemorrhage and subcutaneous emphysema inferior to the right inferior rectus muscle/ inferior oblique complex which is not entrapped within the bony of fracture.  Exophthalmos with right preseptal hematoma with hematoma extending slightly in a postseptal position surrounding the insertion sites of the extra-ocular muscles. Right orbital emphysema. Elongated right optic nerve without discrete peri optic hematoma noted. No fracture at the level of the orbital apex causing optic nerve impingement detected.  Fluid within the right sphenoid sinus air cell. No adjacent skull base fracture.  IMPRESSION: CT ORBIT/ MAXILLOFACIAL:  Complex fracture right orbital region. This includes a comminuted inferiorly displaced right orbital floor fracture, fracture of the medial wall of the right orbit/ethmoid sinus air cells, fracture of the right nasal bone, right lacrimal duct bony landmarks and anterior wall of the right maxillary sinus. Inferior displaced right orbital floor fracture fragments. Rotation of fracture fragments at the level of the nasal lacrimal duct/ nasal bones/medial orbital floor/ethmoid sinus air cell fracture fragments.  Hemorrhage within the right maxillary sinus. Hemorrhage and subcutaneous emphysema inferior to the right inferior rectus muscle/ inferior oblique complex which is not entrapped within the bony of fracture.  Exophthalmos with right preseptal hematoma with hematoma extending slightly in a postseptal position surrounding the insertion sites of the extra-ocular muscles. Right orbital emphysema. Elongated right optic nerve without discrete peri optic hematoma noted. No fracture at the level of the orbital apex causing optic nerve impingement detected.  No intracranial hemorrhage or evidence of cervical spine fracture. Cervical spine exam is slightly limited by motion degradation.  These results were called by telephone at the time of interpretation on 11/29/2013 at time of imaging to Dr. Elnora Morrison , who verbally acknowledged these results.   Electronically Signed   By: Chauncey Cruel M.D.   On: 11/29/2013 23:43   Dg Chest Port 1 View  11/29/2013   CLINICAL DATA:  Level 2 trauma from fall.  Initial encounter  EXAM: PORTABLE CHEST - 1 VIEW  COMPARISON:  09/16/2011  FINDINGS: Mild cardiomegaly, accentuated by technique. Aortic contours appear similar to 2013. Right perihilar bandlike opacity. No evidence of hemothorax, pneumothorax, or lung contusion. No fracture appreciated.  IMPRESSION: 1. No definite injury. Sensitivity is decreased by low lung volumes, if CT imaging is not planned recommend repeat upright chest radiography. 2. Mild right perihilar atelectasis.   Electronically Signed   By: Jorje Guild M.D.   On: 11/29/2013 23:49   Ct Maxillofacial Wo Cm  11/29/2013   CLINICAL DATA:  48 year old female post fall appear Right orbital injury with elevated right orbital pressure. Insert initial  EXAM: CT HEAD WITHOUT CONTRAST  CT MAXILLOFACIAL WITHOUT CONTRAST  CT CERVICAL SPINE WITHOUT CONTRAST  CT ORBITS WITHOUT CONTRAST  TECHNIQUE: Multidetector CT imaging of the head, cervical spine, and maxillofacial structures were performed using the standard protocol without intravenous contrast. Multiplanar CT image reconstructions of the cervical spine and maxillofacial structures were also generated.  COMPARISON:  06/15/2013 and 03/05/2013 head CT.  FINDINGS: CT HEAD FINDINGS  Complex right orbital region fracture as discussed below. No intracranial hemorrhage.  No CT evidence of large acute infarct.  No hydrocephalus.  No intracranial mass lesion noted on this unenhanced exam.  CT MAXILLOFACIAL FINDINGS  Complex right orbital region fracture as discussed below.  CT CERVICAL SPINE FINDINGS  Mild motion degradation C5 through T2. Taking this mild limitation into account, no cervical spine fracture or malalignment noted.  No abnormal prevertebral soft tissue swelling.  CT ORBIT FINDINGS  Complex  fracture right orbital region. This includes a comminuted inferiorly displaced right orbital floor fracture, fracture of the medial wall of the right orbit/ethmoid sinus air cells, fracture of the right nasal bone, right lacrimal duct bony landmarks and anterior wall of the right maxillary sinus. Inferior displaced right orbital floor fracture fragments. Rotation of fracture fragments at the level of the nasal lacrimal duct/ nasal bones/medial orbital floor/ethmoid sinus air cell fracture  fragment.  Hemorrhage within the right maxillary sinus. Hemorrhage and subcutaneous emphysema inferior to the right inferior rectus muscle/ inferior oblique complex which is not entrapped within the bony of fracture.  Exophthalmos with right preseptal hematoma with hematoma extending slightly in a postseptal position surrounding the insertion sites of the extra-ocular muscles. Right orbital emphysema. Elongated right optic nerve without discrete peri optic hematoma noted. No fracture at the level of the orbital apex causing optic nerve impingement detected.  Fluid within the right sphenoid sinus air cell. No adjacent skull base fracture.  IMPRESSION: CT ORBIT/ MAXILLOFACIAL:  Complex fracture right orbital region. This includes a comminuted inferiorly displaced right orbital floor fracture, fracture of the medial wall of the right orbit/ethmoid sinus air cells, fracture of the right nasal bone, right lacrimal duct bony landmarks and anterior wall of the right maxillary sinus. Inferior displaced right orbital floor fracture fragments. Rotation of fracture fragments at the level of the nasal lacrimal duct/ nasal bones/medial orbital floor/ethmoid sinus air cell fracture fragments.  Hemorrhage within the right maxillary sinus. Hemorrhage and subcutaneous emphysema inferior to the right inferior rectus muscle/ inferior oblique complex which is not entrapped within the bony of fracture.  Exophthalmos with right preseptal hematoma with  hematoma extending slightly in a postseptal position surrounding the insertion sites of the extra-ocular muscles. Right orbital emphysema. Elongated right optic nerve without discrete peri optic hematoma noted. No fracture at the level of the orbital apex causing optic nerve impingement detected.  No intracranial hemorrhage or evidence of cervical spine fracture. Cervical spine exam is slightly limited by motion degradation.  These results were called by telephone at the time of interpretation on 11/29/2013 at time of imaging to Dr. Elnora Morrison , who verbally acknowledged these results.   Electronically Signed   By: Chauncey Cruel M.D.   On: 11/29/2013 23:43   Ct Orbitss W/o Cm  11/29/2013   CLINICAL DATA:  48 year old female post fall appear Right orbital injury with elevated right orbital pressure. Insert initial  EXAM: CT HEAD WITHOUT CONTRAST  CT MAXILLOFACIAL WITHOUT CONTRAST  CT CERVICAL SPINE WITHOUT CONTRAST  CT ORBITS WITHOUT CONTRAST  TECHNIQUE: Multidetector CT imaging of the head, cervical spine, and maxillofacial structures were performed using the standard protocol without intravenous contrast. Multiplanar CT image reconstructions of the cervical spine and maxillofacial structures were also generated.  COMPARISON:  06/15/2013 and 03/05/2013 head CT.  FINDINGS: CT HEAD FINDINGS  Complex right orbital region fracture as discussed below. No intracranial hemorrhage.  No CT evidence of large acute infarct.  No hydrocephalus.  No intracranial mass lesion noted on this unenhanced exam.  CT MAXILLOFACIAL FINDINGS  Complex right orbital region fracture as discussed below.  CT CERVICAL SPINE FINDINGS  Mild motion degradation C5 through T2. Taking this mild limitation into account, no cervical spine fracture or malalignment noted.  No abnormal prevertebral soft tissue swelling.  CT ORBIT FINDINGS  Complex fracture right orbital region. This includes a comminuted inferiorly displaced right orbital floor  fracture, fracture of the medial wall of the right orbit/ethmoid sinus air cells, fracture of the right nasal bone, right lacrimal duct bony landmarks and anterior wall of the right maxillary sinus. Inferior displaced right orbital floor fracture fragments. Rotation of fracture fragments at the level of the nasal lacrimal duct/ nasal bones/medial orbital floor/ethmoid sinus air cell fracture fragment.  Hemorrhage within the right maxillary sinus. Hemorrhage and subcutaneous emphysema inferior to the right inferior rectus muscle/ inferior oblique complex which is not  entrapped within the bony of fracture.  Exophthalmos with right preseptal hematoma with hematoma extending slightly in a postseptal position surrounding the insertion sites of the extra-ocular muscles. Right orbital emphysema. Elongated right optic nerve without discrete peri optic hematoma noted. No fracture at the level of the orbital apex causing optic nerve impingement detected.  Fluid within the right sphenoid sinus air cell. No adjacent skull base fracture.  IMPRESSION: CT ORBIT/ MAXILLOFACIAL:  Complex fracture right orbital region. This includes a comminuted inferiorly displaced right orbital floor fracture, fracture of the medial wall of the right orbit/ethmoid sinus air cells, fracture of the right nasal bone, right lacrimal duct bony landmarks and anterior wall of the right maxillary sinus. Inferior displaced right orbital floor fracture fragments. Rotation of fracture fragments at the level of the nasal lacrimal duct/ nasal bones/medial orbital floor/ethmoid sinus air cell fracture fragments.  Hemorrhage within the right maxillary sinus. Hemorrhage and subcutaneous emphysema inferior to the right inferior rectus muscle/ inferior oblique complex which is not entrapped within the bony of fracture.  Exophthalmos with right preseptal hematoma with hematoma extending slightly in a postseptal position surrounding the insertion sites of the  extra-ocular muscles. Right orbital emphysema. Elongated right optic nerve without discrete peri optic hematoma noted. No fracture at the level of the orbital apex causing optic nerve impingement detected.  No intracranial hemorrhage or evidence of cervical spine fracture. Cervical spine exam is slightly limited by motion degradation.  These results were called by telephone at the time of interpretation on 11/29/2013 at time of imaging to Dr. Elnora Morrison , who verbally acknowledged these results.   Electronically Signed   By: Chauncey Cruel M.D.   On: 11/29/2013 23:43    Review of Systems  Unable to perform ROS: mental acuity   Blood pressure 106/57, pulse 93, temperature 97.3 F (36.3 C), temperature source Oral, resp. rate 10, height 5' 4"  (1.626 m), weight 98.2 kg (216 lb 7.9 oz), SpO2 95.00%. Physical Exam  Constitutional: She appears well-developed and well-nourished.  Eyes: Conjunctivae and EOM are normal. Pupils are equal, round, and reactive to light. Right eye exhibits chemosis. Right eye exhibits no discharge, no exudate and no hordeolum. No foreign body present in the right eye. Left eye exhibits no chemosis, no discharge, no exudate and no hordeolum. No foreign body present in the left eye.    Assessment/Plan: Right sided blunt trauma. Complex right orbital fracture being followed by plastic surgery. Bandage removed and eye cleaned with saline and gauze. Eye appears atraumatic. History of abnormal pupils, however they are normal at this point. Stable from an ophthalmology perspective. Advise follow up with plastics to address complex orbital fracture as outpatient. I spoke with plastics team personally.   Please contact me if further assistance needed. Equality 11/30/2013, 3:28 PM

## 2013-11-30 NOTE — H&P (Signed)
Brittany [name] is an 48 y.o. female.   Chief Complaint: fall with facial trauma HPI: Patient was brought in by EMS after a fall. She was found to have right-sided facial trauma. She was called a level II trauma on arrival. Workup has revealed a somewhat decreased level consciousness and right-sided facial fractures. I was asked to see her from vision to the trauma service. She is unable to contribute with history.  Past Medical History  Diagnosis Date  . Hypertension   . Bipolar 1 disorder   . Anxiety   . Back pain   . Bulging disc   . Ruptured intervertebral disc   . Migraine   . Head injury   . Short-term memory loss   . Depression   . Hypothyroidism   . Goiter     Past Surgical History  Procedure Laterality Date  . Abdominal hysterectomy      Family History  Problem Relation Age of Onset  . Migraines Mother   . Migraines Father   . Other Father   . Migraines Sister    Social History:  reports that she has never smoked. She has never used smokeless tobacco. She reports that she does not drink alcohol or use illicit drugs.  Allergies:  Allergies  Allergen Reactions  . Toradol [Ketorolac Tromethamine] Swelling  . Trazodone And Nefazodone     Bladder infection     (Not in a hospital admission)  Results for orders placed during the hospital encounter of 11/29/13 (from the past 48 hour(s))  CBC WITH DIFFERENTIAL     Status: Abnormal   Collection Time    11/29/13 11:00 PM      Result Value Ref Range   WBC 7.2  4.0 - 10.5 K/uL   RBC 3.70 (*) 3.87 - 5.11 MIL/uL   Hemoglobin 11.2 (*) 12.0 - 15.0 g/dL   HCT 35.0 (*) 36.0 - 46.0 %   MCV 94.6  78.0 - 100.0 fL   MCH 30.3  26.0 - 34.0 pg   MCHC 32.0  30.0 - 36.0 g/dL   RDW 13.5  11.5 - 15.5 %   Platelets 222  150 - 400 K/uL   Neutrophils Relative % 75  43 - 77 %   Neutro Abs 5.3  1.7 - 7.7 K/uL   Lymphocytes Relative 16  12 - 46 %   Lymphs Abs 1.2  0.7 - 4.0 K/uL   Monocytes Relative 7  3 - 12 %   Monocytes  Absolute 0.5  0.1 - 1.0 K/uL   Eosinophils Relative 2  0 - 5 %   Eosinophils Absolute 0.1  0.0 - 0.7 K/uL   Basophils Relative 0  0 - 1 %   Basophils Absolute 0.0  0.0 - 0.1 K/uL  BASIC METABOLIC PANEL     Status: Abnormal   Collection Time    11/29/13 11:00 PM      Result Value Ref Range   Sodium 140  137 - 147 mEq/L   Potassium 3.9  3.7 - 5.3 mEq/L   Chloride 102  96 - 112 mEq/L   CO2 27  19 - 32 mEq/L   Glucose, Bld 111 (*) 70 - 99 mg/dL   BUN 18  6 - 23 mg/dL   Creatinine, Ser 0.63  0.50 - 1.10 mg/dL   Calcium 9.0  8.4 - 10.5 mg/dL   GFR calc non Af Amer >90  >90 mL/min   GFR calc Af Amer >90  >90 mL/min  Comment: (NOTE)     The eGFR has been calculated using the CKD EPI equation.     This calculation has not been validated in all clinical situations.     eGFR's persistently <90 mL/min signify possible Chronic Kidney     Disease.   Anion gap 11  5 - 15  ETHANOL     Status: None   Collection Time    11/29/13 11:00 PM      Result Value Ref Range   Alcohol, Ethyl (B) <11  0 - 11 mg/dL   Comment:            LOWEST DETECTABLE LIMIT FOR     SERUM ALCOHOL IS 11 mg/dL     FOR MEDICAL PURPOSES ONLY  ACETAMINOPHEN LEVEL     Status: None   Collection Time    11/29/13 11:00 PM      Result Value Ref Range   Acetaminophen (Tylenol), Serum <15.0  10 - 30 ug/mL   Comment:            THERAPEUTIC CONCENTRATIONS VARY     SIGNIFICANTLY. A RANGE OF 10-30     ug/mL MAY BE AN EFFECTIVE     CONCENTRATION FOR MANY PATIENTS.     HOWEVER, SOME ARE BEST TREATED     AT CONCENTRATIONS OUTSIDE THIS     RANGE.     ACETAMINOPHEN CONCENTRATIONS     >150 ug/mL AT 4 HOURS AFTER     INGESTION AND >50 ug/mL AT 12     HOURS AFTER INGESTION ARE     OFTEN ASSOCIATED WITH TOXIC     REACTIONS.  SALICYLATE LEVEL     Status: Abnormal   Collection Time    11/29/13 11:00 PM      Result Value Ref Range   Salicylate Lvl <0.9 (*) 2.8 - 20.0 mg/dL  PROTIME-INR     Status: None   Collection Time     11/29/13 11:00 PM      Result Value Ref Range   Prothrombin Time 14.3  11.6 - 15.2 seconds   INR 1.10  0.00 - 1.49  TYPE AND SCREEN     Status: None   Collection Time    11/29/13 11:00 PM      Result Value Ref Range   ABO/RH(D) A POS     Antibody Screen NEG     Sample Expiration 12/02/2013    ABO/RH     Status: None   Collection Time    11/29/13 11:00 PM      Result Value Ref Range   ABO/RH(D) A POS    I-STAT CHEM 8, ED     Status: Abnormal   Collection Time    11/29/13 11:09 PM      Result Value Ref Range   Sodium 141  137 - 147 mEq/L   Potassium 3.7  3.7 - 5.3 mEq/L   Chloride 103  96 - 112 mEq/L   BUN 18  6 - 23 mg/dL   Creatinine, Ser 0.60  0.50 - 1.10 mg/dL   Glucose, Bld 116 (*) 70 - 99 mg/dL   Calcium, Ion 1.13  1.12 - 1.23 mmol/L   TCO2 26  0 - 100 mmol/L   Hemoglobin 12.6  12.0 - 15.0 g/dL   HCT 37.0  36.0 - 46.0 %   Ct Head Wo Contrast  11/29/2013   CLINICAL DATA:  48 year old female post fall appear Right orbital injury with elevated right orbital pressure. Insert  initial  EXAM: CT HEAD WITHOUT CONTRAST  CT MAXILLOFACIAL WITHOUT CONTRAST  CT CERVICAL SPINE WITHOUT CONTRAST  CT ORBITS WITHOUT CONTRAST  TECHNIQUE: Multidetector CT imaging of the head, cervical spine, and maxillofacial structures were performed using the standard protocol without intravenous contrast. Multiplanar CT image reconstructions of the cervical spine and maxillofacial structures were also generated.  COMPARISON:  06/15/2013 and 03/05/2013 head CT.  FINDINGS: CT HEAD FINDINGS  Complex right orbital region fracture as discussed below. No intracranial hemorrhage.  No CT evidence of large acute infarct.  No hydrocephalus.  No intracranial mass lesion noted on this unenhanced exam.  CT MAXILLOFACIAL FINDINGS  Complex right orbital region fracture as discussed below.  CT CERVICAL SPINE FINDINGS  Mild motion degradation C5 through T2. Taking this mild limitation into account, no cervical spine  fracture or malalignment noted.  No abnormal prevertebral soft tissue swelling.  CT ORBIT FINDINGS  Complex fracture right orbital region. This includes a comminuted inferiorly displaced right orbital floor fracture, fracture of the medial wall of the right orbit/ethmoid sinus air cells, fracture of the right nasal bone, right lacrimal duct bony landmarks and anterior wall of the right maxillary sinus. Inferior displaced right orbital floor fracture fragments. Rotation of fracture fragments at the level of the nasal lacrimal duct/ nasal bones/medial orbital floor/ethmoid sinus air cell fracture fragment.  Hemorrhage within the right maxillary sinus. Hemorrhage and subcutaneous emphysema inferior to the right inferior rectus muscle/ inferior oblique complex which is not entrapped within the bony of fracture.  Exophthalmos with right preseptal hematoma with hematoma extending slightly in a postseptal position surrounding the insertion sites of the extra-ocular muscles. Right orbital emphysema. Elongated right optic nerve without discrete peri optic hematoma noted. No fracture at the level of the orbital apex causing optic nerve impingement detected.  Fluid within the right sphenoid sinus air cell. No adjacent skull base fracture.  IMPRESSION: CT ORBIT/ MAXILLOFACIAL:  Complex fracture right orbital region. This includes a comminuted inferiorly displaced right orbital floor fracture, fracture of the medial wall of the right orbit/ethmoid sinus air cells, fracture of the right nasal bone, right lacrimal duct bony landmarks and anterior wall of the right maxillary sinus. Inferior displaced right orbital floor fracture fragments. Rotation of fracture fragments at the level of the nasal lacrimal duct/ nasal bones/medial orbital floor/ethmoid sinus air cell fracture fragments.  Hemorrhage within the right maxillary sinus. Hemorrhage and subcutaneous emphysema inferior to the right inferior rectus muscle/ inferior oblique  complex which is not entrapped within the bony of fracture.  Exophthalmos with right preseptal hematoma with hematoma extending slightly in a postseptal position surrounding the insertion sites of the extra-ocular muscles. Right orbital emphysema. Elongated right optic nerve without discrete peri optic hematoma noted. No fracture at the level of the orbital apex causing optic nerve impingement detected.  No intracranial hemorrhage or evidence of cervical spine fracture. Cervical spine exam is slightly limited by motion degradation.  These results were called by telephone at the time of interpretation on 11/29/2013 at time of imaging to Dr. Elnora Morrison , who verbally acknowledged these results.   Electronically Signed   By: Chauncey Cruel M.D.   On: 11/29/2013 23:43   Ct Cervical Spine Wo Contrast  11/29/2013   CLINICAL DATA:  48 year old female post fall appear Right orbital injury with elevated right orbital pressure. Insert initial  EXAM: CT HEAD WITHOUT CONTRAST  CT MAXILLOFACIAL WITHOUT CONTRAST  CT CERVICAL SPINE WITHOUT CONTRAST  CT ORBITS WITHOUT CONTRAST  TECHNIQUE: Multidetector CT imaging of the head, cervical spine, and maxillofacial structures were performed using the standard protocol without intravenous contrast. Multiplanar CT image reconstructions of the cervical spine and maxillofacial structures were also generated.  COMPARISON:  06/15/2013 and 03/05/2013 head CT.  FINDINGS: CT HEAD FINDINGS  Complex right orbital region fracture as discussed below. No intracranial hemorrhage.  No CT evidence of large acute infarct.  No hydrocephalus.  No intracranial mass lesion noted on this unenhanced exam.  CT MAXILLOFACIAL FINDINGS  Complex right orbital region fracture as discussed below.  CT CERVICAL SPINE FINDINGS  Mild motion degradation C5 through T2. Taking this mild limitation into account, no cervical spine fracture or malalignment noted.  No abnormal prevertebral soft tissue swelling.  CT  ORBIT FINDINGS  Complex fracture right orbital region. This includes a comminuted inferiorly displaced right orbital floor fracture, fracture of the medial wall of the right orbit/ethmoid sinus air cells, fracture of the right nasal bone, right lacrimal duct bony landmarks and anterior wall of the right maxillary sinus. Inferior displaced right orbital floor fracture fragments. Rotation of fracture fragments at the level of the nasal lacrimal duct/ nasal bones/medial orbital floor/ethmoid sinus air cell fracture fragment.  Hemorrhage within the right maxillary sinus. Hemorrhage and subcutaneous emphysema inferior to the right inferior rectus muscle/ inferior oblique complex which is not entrapped within the bony of fracture.  Exophthalmos with right preseptal hematoma with hematoma extending slightly in a postseptal position surrounding the insertion sites of the extra-ocular muscles. Right orbital emphysema. Elongated right optic nerve without discrete peri optic hematoma noted. No fracture at the level of the orbital apex causing optic nerve impingement detected.  Fluid within the right sphenoid sinus air cell. No adjacent skull base fracture.  IMPRESSION: CT ORBIT/ MAXILLOFACIAL:  Complex fracture right orbital region. This includes a comminuted inferiorly displaced right orbital floor fracture, fracture of the medial wall of the right orbit/ethmoid sinus air cells, fracture of the right nasal bone, right lacrimal duct bony landmarks and anterior wall of the right maxillary sinus. Inferior displaced right orbital floor fracture fragments. Rotation of fracture fragments at the level of the nasal lacrimal duct/ nasal bones/medial orbital floor/ethmoid sinus air cell fracture fragments.  Hemorrhage within the right maxillary sinus. Hemorrhage and subcutaneous emphysema inferior to the right inferior rectus muscle/ inferior oblique complex which is not entrapped within the bony of fracture.  Exophthalmos with right  preseptal hematoma with hematoma extending slightly in a postseptal position surrounding the insertion sites of the extra-ocular muscles. Right orbital emphysema. Elongated right optic nerve without discrete peri optic hematoma noted. No fracture at the level of the orbital apex causing optic nerve impingement detected.  No intracranial hemorrhage or evidence of cervical spine fracture. Cervical spine exam is slightly limited by motion degradation.  These results were called by telephone at the time of interpretation on 11/29/2013 at time of imaging to Dr. Elnora Morrison , who verbally acknowledged these results.   Electronically Signed   By: Chauncey Cruel M.D.   On: 11/29/2013 23:43   Dg Chest Port 1 View  11/29/2013   CLINICAL DATA:  Level 2 trauma from fall.  Initial encounter  EXAM: PORTABLE CHEST - 1 VIEW  COMPARISON:  09/16/2011  FINDINGS: Mild cardiomegaly, accentuated by technique. Aortic contours appear similar to 2013. Right perihilar bandlike opacity. No evidence of hemothorax, pneumothorax, or lung contusion. No fracture appreciated.  IMPRESSION: 1. No definite injury. Sensitivity is decreased by low lung volumes, if CT imaging is  not planned recommend repeat upright chest radiography. 2. Mild right perihilar atelectasis.   Electronically Signed   By: Jorje Guild M.D.   On: 11/29/2013 23:49   Ct Maxillofacial Wo Cm  11/29/2013   CLINICAL DATA:  48 year old female post fall appear Right orbital injury with elevated right orbital pressure. Insert initial  EXAM: CT HEAD WITHOUT CONTRAST  CT MAXILLOFACIAL WITHOUT CONTRAST  CT CERVICAL SPINE WITHOUT CONTRAST  CT ORBITS WITHOUT CONTRAST  TECHNIQUE: Multidetector CT imaging of the head, cervical spine, and maxillofacial structures were performed using the standard protocol without intravenous contrast. Multiplanar CT image reconstructions of the cervical spine and maxillofacial structures were also generated.  COMPARISON:  06/15/2013 and  03/05/2013 head CT.  FINDINGS: CT HEAD FINDINGS  Complex right orbital region fracture as discussed below. No intracranial hemorrhage.  No CT evidence of large acute infarct.  No hydrocephalus.  No intracranial mass lesion noted on this unenhanced exam.  CT MAXILLOFACIAL FINDINGS  Complex right orbital region fracture as discussed below.  CT CERVICAL SPINE FINDINGS  Mild motion degradation C5 through T2. Taking this mild limitation into account, no cervical spine fracture or malalignment noted.  No abnormal prevertebral soft tissue swelling.  CT ORBIT FINDINGS  Complex fracture right orbital region. This includes a comminuted inferiorly displaced right orbital floor fracture, fracture of the medial wall of the right orbit/ethmoid sinus air cells, fracture of the right nasal bone, right lacrimal duct bony landmarks and anterior wall of the right maxillary sinus. Inferior displaced right orbital floor fracture fragments. Rotation of fracture fragments at the level of the nasal lacrimal duct/ nasal bones/medial orbital floor/ethmoid sinus air cell fracture fragment.  Hemorrhage within the right maxillary sinus. Hemorrhage and subcutaneous emphysema inferior to the right inferior rectus muscle/ inferior oblique complex which is not entrapped within the bony of fracture.  Exophthalmos with right preseptal hematoma with hematoma extending slightly in a postseptal position surrounding the insertion sites of the extra-ocular muscles. Right orbital emphysema. Elongated right optic nerve without discrete peri optic hematoma noted. No fracture at the level of the orbital apex causing optic nerve impingement detected.  Fluid within the right sphenoid sinus air cell. No adjacent skull base fracture.  IMPRESSION: CT ORBIT/ MAXILLOFACIAL:  Complex fracture right orbital region. This includes a comminuted inferiorly displaced right orbital floor fracture, fracture of the medial wall of the right orbit/ethmoid sinus air cells,  fracture of the right nasal bone, right lacrimal duct bony landmarks and anterior wall of the right maxillary sinus. Inferior displaced right orbital floor fracture fragments. Rotation of fracture fragments at the level of the nasal lacrimal duct/ nasal bones/medial orbital floor/ethmoid sinus air cell fracture fragments.  Hemorrhage within the right maxillary sinus. Hemorrhage and subcutaneous emphysema inferior to the right inferior rectus muscle/ inferior oblique complex which is not entrapped within the bony of fracture.  Exophthalmos with right preseptal hematoma with hematoma extending slightly in a postseptal position surrounding the insertion sites of the extra-ocular muscles. Right orbital emphysema. Elongated right optic nerve without discrete peri optic hematoma noted. No fracture at the level of the orbital apex causing optic nerve impingement detected.  No intracranial hemorrhage or evidence of cervical spine fracture. Cervical spine exam is slightly limited by motion degradation.  These results were called by telephone at the time of interpretation on 11/29/2013 at time of imaging to Dr. Elnora Morrison , who verbally acknowledged these results.   Electronically Signed   By: Mignon Pine.D.  On: 11/29/2013 23:43   Ct Orbitss W/o Cm  11/29/2013   CLINICAL DATA:  48 year old female post fall appear Right orbital injury with elevated right orbital pressure. Insert initial  EXAM: CT HEAD WITHOUT CONTRAST  CT MAXILLOFACIAL WITHOUT CONTRAST  CT CERVICAL SPINE WITHOUT CONTRAST  CT ORBITS WITHOUT CONTRAST  TECHNIQUE: Multidetector CT imaging of the head, cervical spine, and maxillofacial structures were performed using the standard protocol without intravenous contrast. Multiplanar CT image reconstructions of the cervical spine and maxillofacial structures were also generated.  COMPARISON:  06/15/2013 and 03/05/2013 head CT.  FINDINGS: CT HEAD FINDINGS  Complex right orbital region fracture as  discussed below. No intracranial hemorrhage.  No CT evidence of large acute infarct.  No hydrocephalus.  No intracranial mass lesion noted on this unenhanced exam.  CT MAXILLOFACIAL FINDINGS  Complex right orbital region fracture as discussed below.  CT CERVICAL SPINE FINDINGS  Mild motion degradation C5 through T2. Taking this mild limitation into account, no cervical spine fracture or malalignment noted.  No abnormal prevertebral soft tissue swelling.  CT ORBIT FINDINGS  Complex fracture right orbital region. This includes a comminuted inferiorly displaced right orbital floor fracture, fracture of the medial wall of the right orbit/ethmoid sinus air cells, fracture of the right nasal bone, right lacrimal duct bony landmarks and anterior wall of the right maxillary sinus. Inferior displaced right orbital floor fracture fragments. Rotation of fracture fragments at the level of the nasal lacrimal duct/ nasal bones/medial orbital floor/ethmoid sinus air cell fracture fragment.  Hemorrhage within the right maxillary sinus. Hemorrhage and subcutaneous emphysema inferior to the right inferior rectus muscle/ inferior oblique complex which is not entrapped within the bony of fracture.  Exophthalmos with right preseptal hematoma with hematoma extending slightly in a postseptal position surrounding the insertion sites of the extra-ocular muscles. Right orbital emphysema. Elongated right optic nerve without discrete peri optic hematoma noted. No fracture at the level of the orbital apex causing optic nerve impingement detected.  Fluid within the right sphenoid sinus air cell. No adjacent skull base fracture.  IMPRESSION: CT ORBIT/ MAXILLOFACIAL:  Complex fracture right orbital region. This includes a comminuted inferiorly displaced right orbital floor fracture, fracture of the medial wall of the right orbit/ethmoid sinus air cells, fracture of the right nasal bone, right lacrimal duct bony landmarks and anterior wall of the  right maxillary sinus. Inferior displaced right orbital floor fracture fragments. Rotation of fracture fragments at the level of the nasal lacrimal duct/ nasal bones/medial orbital floor/ethmoid sinus air cell fracture fragments.  Hemorrhage within the right maxillary sinus. Hemorrhage and subcutaneous emphysema inferior to the right inferior rectus muscle/ inferior oblique complex which is not entrapped within the bony of fracture.  Exophthalmos with right preseptal hematoma with hematoma extending slightly in a postseptal position surrounding the insertion sites of the extra-ocular muscles. Right orbital emphysema. Elongated right optic nerve without discrete peri optic hematoma noted. No fracture at the level of the orbital apex causing optic nerve impingement detected.  No intracranial hemorrhage or evidence of cervical spine fracture. Cervical spine exam is slightly limited by motion degradation.  These results were called by telephone at the time of interpretation on 11/29/2013 at time of imaging to Dr. Elnora Morrison , who verbally acknowledged these results.   Electronically Signed   By: Chauncey Cruel M.D.   On: 11/29/2013 23:43    Review of Systems  Unable to perform ROS: mental status change    Blood pressure 116/75, pulse 83,  temperature 95.9 F (35.5 C), temperature source Rectal, resp. rate 9, height _0  (1.676 m), weight 310 lb (140.615 kg), SpO2 94.00%. Physical Exam  Constitutional: She appears well-developed and well-nourished. She appears lethargic. No distress.  HENT:  Head: Head is with contusion.    Right Ear: Hearing, tympanic membrane, external ear and ear canal normal.  Left Ear: Hearing, tympanic membrane, external ear and ear canal normal.  Mouth/Throat: Uvula is midline and oropharynx is clear and moist.  Right orbital edema and contusions, lateral canthotomy, abrasions, Tenderness right orbit, Mild nasal tenderness  Eyes:  Mild right proptosis, right pupil 6 mm and  nonreactive left pupil 2 mm and sluggish  Neck:  No posterior midline tenderness, no pain on active range of motion, collar removed  Cardiovascular: Normal rate, normal heart sounds and intact distal pulses.   Respiratory: Effort normal and breath sounds normal. No respiratory distress. She has no wheezes. She has no rales.  GI: Soft. She exhibits no distension. There is no tenderness. There is no rebound and no guarding.  Musculoskeletal: Normal range of motion.  Old abrasion right knee, contusion left knee area  Neurological: She appears lethargic. GCS eye subscore is 3. GCS verbal subscore is 5. GCS motor subscore is 6.  Very sleepy but arouses and follows commands easily, will not answer questions consistently, moves all extremities  Skin: Skin is warm.     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 Check UDS. Optho consult - EDP D/W Dr. Baird Cancer. Facial trauma consult - Dr. Iran Planas.   Zan Orlick E 11/30/2013, 12:16 AM

## 2013-11-30 NOTE — ED Provider Notes (Signed)
CSN: [id number]     Arrival date & time 11/29/13  2202 History   First MD Initiated Contact with Patient 11/29/13 2216     Chief Complaint  Patient presents with  . Fall     (Consider location/radiation/quality/duration/timing/severity/associated sxs/prior Treatment) HPI Comments: Patient is a 48 year old female who comes in as a unlevel trauma later upgraded to 2. History is limited as patient is altered. Per EMS patient was walking dog when she tripped and "faceplanted" landing on her face. EMS was called by boyfriend who EMS states was very intoxicated and cannot give a great history. It is reported that patient may have been drinking or using drugs tonight. Patient is somnolent, and does not give a great history just stating she hurts. She states she is face pain, denies other pain when asked specifically. She does endorse diminished vision in her right eye. She cannot quantify this. No family available for corroboration.   Level V caveat as patient altered  Patient is a 48 y.o. female presenting with fall. The history is provided by the EMS personnel. The history is limited by the condition of the patient.  Fall    Past Medical History  Diagnosis Date  . Hypertension   . Bipolar 1 disorder   . Anxiety   . Back pain   . Bulging disc   . Ruptured intervertebral disc   . Migraine   . Head injury   . Short-term memory loss   . Depression   . Hypothyroidism   . Goiter    Past Surgical History  Procedure Laterality Date  . Abdominal hysterectomy     Family History  Problem Relation Age of Onset  . Migraines Mother   . Migraines Father   . Other Father   . Migraines Sister    History  Substance Use Topics  . Smoking status: Never Smoker   . Smokeless tobacco: Never Used  . Alcohol Use: No   OB History   Grav Para Term Preterm Abortions TAB SAB Ect Mult Living                 Review of Systems  Unable to perform ROS: Mental status change      Allergies   Toradol and Trazodone and nefazodone  Home Medications   Prior to Admission medications   Medication Sig Start Date End Date Taking? Authorizing Provider  butalbital-acetaminophen-caffeine (FIORICET, ESGIC) 50-325-40 MG per tablet Take 1 tablet by mouth every 6 (six) hours as needed for headache.     Historical Provider, MD  cholecalciferol (VITAMIN D) 1000 UNITS tablet Take 1 tablet (1,000 Units total) by mouth daily. 09/25/13   Benjamine Mola, FNP  Cholecalciferol (VITAMIN D-3) 5000 UNITS TABS Take 5,000 Units by mouth daily.    Historical Provider, MD  diphenhydrAMINE (SOMINEX) 25 MG tablet Take 25 mg by mouth at bedtime as needed for itching or sleep.    Historical Provider, MD  doxycycline (VIBRA-TABS) 100 MG tablet Take 1 tablet (100 mg total) by mouth 2 (two) times daily. 10/06/13   Harden Mo, MD  DULoxetine (CYMBALTA) 60 MG capsule Take 60 mg by mouth daily.    Historical Provider, MD  gabapentin (NEURONTIN) 600 MG tablet Take 600 mg by mouth every other day. 09/25/13   Benjamine Mola, FNP  hydrOXYzine (ATARAX/VISTARIL) 25 MG tablet Take 1 tablet (25 mg total) by mouth every 6 (six) hours as needed for itching. 10/06/13   Harden Mo, MD  levothyroxine (SYNTHROID,  LEVOTHROID) 50 MCG tablet Take 50 mcg by mouth daily.    Historical Provider, MD  mupirocin ointment (BACTROBAN) 2 % Apply to infected area on right knee 3 times daily 10/06/13   Harden Mo, MD  pravastatin (PRAVACHOL) 40 MG tablet Take 40 mg by mouth daily.    Historical Provider, MD  triamcinolone cream (KENALOG) 0.1 % Apply 1 application topically 3 (three) times daily. 10/06/13   Harden Mo, MD   BP 116/75  Pulse 83  Temp(Src) 95.9 F (35.5 C) (Rectal)  Resp 9  Ht 5' 6"  (1.676 m)  Wt 310 lb (140.615 kg)  BMI 50.06 kg/m2  SpO2 94% Physical Exam  Constitutional: She appears well-developed and well-nourished. She appears distressed.  Patient somnolent, distress  HENT:  Head: Normocephalic.   Mouth/Throat: Oropharynx is clear and moist. Oropharyngeal exudate: small amount of blood in nares and oropharynx.  Patient with abrasions on scalp and face  Eyes:  Left eye: Pupil 2 mm, reactive, patient does not comply with EOM testing Right eye.: Pupil is 7 mm, nonreactive, proptosis present, conjunctivae mildly injected, does not comply with EOM testing 3 cm horizontal laceration inferior to eye Left eye pressures: 8,9,8 Right eye pressures: 40, 42, 36  Neck: Neck supple.  In collar so did not do a full range of motion test  no cervical spine step-offs are performed  Cardiovascular: Normal rate, regular rhythm and normal heart sounds.   No murmur heard. Pulmonary/Chest: Breath sounds normal. She is in respiratory distress (Patient mildly hypoxic likely due to effort that is diminished from intoxication responds with nasal cannula protecting airway,). She has no wheezes. She has no rales.  Abdominal: Soft. She exhibits no distension and no mass. There is no tenderness (no pain response).  Neurological: She is alert. She exhibits normal muscle tone. Coordination normal.  Somnolent but awakens to loud voice and follows commands Does not comply with full neuro exam but has no neuro deficits  Skin: Skin is warm. No rash noted. She is not diaphoretic.    ED Course  Procedures (including critical care time)  Lateral Canthotomy: Reason: Risk of permanent vision loss in setting of elevated intraocular pressure Side: Right Consent: No family available, patient unable to consent herself Anesthesia: Lidocaine 1% intradermal Description of procedure: Patient was in resuscitation bay for initial stabilization. It was noticed that patient had a large nonreactive right pupil on the right side as well as large amount of proptosis and conjunctival injection. Intraocular pressures are routinely significantly elevated on the right side as compared to the left. Patient subjectively complains of vision  loss in right side. Using a 27-gauge needle 3 cc of intradermal lidocaine was introduced to the lateral canthus. Using a set of needle driver from suture kit the lateral canthus was crushed for 45 seconds. Using iris scissors an incision was made to carefully extend the lateral canthus making sure to avoid globe. After canthus was extended additional cut was made to release the eyes from the lateral canthus superiorly. The eye was "released". Pressure was checked afterwards with a pressure of 20, 22, 20. Patient is somnolent and cannot say if her vision was immediately improved. Patient tolerated well without any complications. Zavitz at bedside for procedure.  Labs Review Labs Reviewed  CBC WITH DIFFERENTIAL - Abnormal; Notable for the following:    RBC 3.70 (*)    Hemoglobin 11.2 (*)    HCT 35.0 (*)    All other components within normal limits  BASIC  METABOLIC PANEL - Abnormal; Notable for the following:    Glucose, Bld 111 (*)    All other components within normal limits  SALICYLATE LEVEL - Abnormal; Notable for the following:    Salicylate Lvl <8.3 (*)    All other components within normal limits  URINE RAPID DRUG SCREEN (HOSP PERFORMED) - Abnormal; Notable for the following:    Benzodiazepines POSITIVE (*)    Barbiturates POSITIVE (*)    All other components within normal limits  I-STAT CHEM 8, ED - Abnormal; Notable for the following:    Glucose, Bld 116 (*)    All other components within normal limits  ETHANOL  ACETAMINOPHEN LEVEL  PROTIME-INR  CDS SEROLOGY  TYPE AND SCREEN  ABO/RH    Imaging Review Ct Head Wo Contrast  11/29/2013   CLINICAL DATA:  48 year old female post fall appear Right orbital injury with elevated right orbital pressure. Insert initial  EXAM: CT HEAD WITHOUT CONTRAST  CT MAXILLOFACIAL WITHOUT CONTRAST  CT CERVICAL SPINE WITHOUT CONTRAST  CT ORBITS WITHOUT CONTRAST  TECHNIQUE: Multidetector CT imaging of the head, cervical spine, and maxillofacial  structures were performed using the standard protocol without intravenous contrast. Multiplanar CT image reconstructions of the cervical spine and maxillofacial structures were also generated.  COMPARISON:  06/15/2013 and 03/05/2013 head CT.  FINDINGS: CT HEAD FINDINGS  Complex right orbital region fracture as discussed below. No intracranial hemorrhage.  No CT evidence of large acute infarct.  No hydrocephalus.  No intracranial mass lesion noted on this unenhanced exam.  CT MAXILLOFACIAL FINDINGS  Complex right orbital region fracture as discussed below.  CT CERVICAL SPINE FINDINGS  Mild motion degradation C5 through T2. Taking this mild limitation into account, no cervical spine fracture or malalignment noted.  No abnormal prevertebral soft tissue swelling.  CT ORBIT FINDINGS  Complex fracture right orbital region. This includes a comminuted inferiorly displaced right orbital floor fracture, fracture of the medial wall of the right orbit/ethmoid sinus air cells, fracture of the right nasal bone, right lacrimal duct bony landmarks and anterior wall of the right maxillary sinus. Inferior displaced right orbital floor fracture fragments. Rotation of fracture fragments at the level of the nasal lacrimal duct/ nasal bones/medial orbital floor/ethmoid sinus air cell fracture fragment.  Hemorrhage within the right maxillary sinus. Hemorrhage and subcutaneous emphysema inferior to the right inferior rectus muscle/ inferior oblique complex which is not entrapped within the bony of fracture.  Exophthalmos with right preseptal hematoma with hematoma extending slightly in a postseptal position surrounding the insertion sites of the extra-ocular muscles. Right orbital emphysema. Elongated right optic nerve without discrete peri optic hematoma noted. No fracture at the level of the orbital apex causing optic nerve impingement detected.  Fluid within the right sphenoid sinus air cell. No adjacent skull base fracture.   IMPRESSION: CT ORBIT/ MAXILLOFACIAL:  Complex fracture right orbital region. This includes a comminuted inferiorly displaced right orbital floor fracture, fracture of the medial wall of the right orbit/ethmoid sinus air cells, fracture of the right nasal bone, right lacrimal duct bony landmarks and anterior wall of the right maxillary sinus. Inferior displaced right orbital floor fracture fragments. Rotation of fracture fragments at the level of the nasal lacrimal duct/ nasal bones/medial orbital floor/ethmoid sinus air cell fracture fragments.  Hemorrhage within the right maxillary sinus. Hemorrhage and subcutaneous emphysema inferior to the right inferior rectus muscle/ inferior oblique complex which is not entrapped within the bony of fracture.  Exophthalmos with right preseptal hematoma with hematoma  extending slightly in a postseptal position surrounding the insertion sites of the extra-ocular muscles. Right orbital emphysema. Elongated right optic nerve without discrete peri optic hematoma noted. No fracture at the level of the orbital apex causing optic nerve impingement detected.  No intracranial hemorrhage or evidence of cervical spine fracture. Cervical spine exam is slightly limited by motion degradation.  These results were called by telephone at the time of interpretation on 11/29/2013 at time of imaging to Dr. Elnora Morrison , who verbally acknowledged these results.   Electronically Signed   By: Chauncey Cruel M.D.   On: 11/29/2013 23:43   Ct Cervical Spine Wo Contrast  11/29/2013   CLINICAL DATA:  48 year old female post fall appear Right orbital injury with elevated right orbital pressure. Insert initial  EXAM: CT HEAD WITHOUT CONTRAST  CT MAXILLOFACIAL WITHOUT CONTRAST  CT CERVICAL SPINE WITHOUT CONTRAST  CT ORBITS WITHOUT CONTRAST  TECHNIQUE: Multidetector CT imaging of the head, cervical spine, and maxillofacial structures were performed using the standard protocol without intravenous  contrast. Multiplanar CT image reconstructions of the cervical spine and maxillofacial structures were also generated.  COMPARISON:  06/15/2013 and 03/05/2013 head CT.  FINDINGS: CT HEAD FINDINGS  Complex right orbital region fracture as discussed below. No intracranial hemorrhage.  No CT evidence of large acute infarct.  No hydrocephalus.  No intracranial mass lesion noted on this unenhanced exam.  CT MAXILLOFACIAL FINDINGS  Complex right orbital region fracture as discussed below.  CT CERVICAL SPINE FINDINGS  Mild motion degradation C5 through T2. Taking this mild limitation into account, no cervical spine fracture or malalignment noted.  No abnormal prevertebral soft tissue swelling.  CT ORBIT FINDINGS  Complex fracture right orbital region. This includes a comminuted inferiorly displaced right orbital floor fracture, fracture of the medial wall of the right orbit/ethmoid sinus air cells, fracture of the right nasal bone, right lacrimal duct bony landmarks and anterior wall of the right maxillary sinus. Inferior displaced right orbital floor fracture fragments. Rotation of fracture fragments at the level of the nasal lacrimal duct/ nasal bones/medial orbital floor/ethmoid sinus air cell fracture fragment.  Hemorrhage within the right maxillary sinus. Hemorrhage and subcutaneous emphysema inferior to the right inferior rectus muscle/ inferior oblique complex which is not entrapped within the bony of fracture.  Exophthalmos with right preseptal hematoma with hematoma extending slightly in a postseptal position surrounding the insertion sites of the extra-ocular muscles. Right orbital emphysema. Elongated right optic nerve without discrete peri optic hematoma noted. No fracture at the level of the orbital apex causing optic nerve impingement detected.  Fluid within the right sphenoid sinus air cell. No adjacent skull base fracture.  IMPRESSION: CT ORBIT/ MAXILLOFACIAL:  Complex fracture right orbital region. This  includes a comminuted inferiorly displaced right orbital floor fracture, fracture of the medial wall of the right orbit/ethmoid sinus air cells, fracture of the right nasal bone, right lacrimal duct bony landmarks and anterior wall of the right maxillary sinus. Inferior displaced right orbital floor fracture fragments. Rotation of fracture fragments at the level of the nasal lacrimal duct/ nasal bones/medial orbital floor/ethmoid sinus air cell fracture fragments.  Hemorrhage within the right maxillary sinus. Hemorrhage and subcutaneous emphysema inferior to the right inferior rectus muscle/ inferior oblique complex which is not entrapped within the bony of fracture.  Exophthalmos with right preseptal hematoma with hematoma extending slightly in a postseptal position surrounding the insertion sites of the extra-ocular muscles. Right orbital emphysema. Elongated right optic nerve without discrete peri  optic hematoma noted. No fracture at the level of the orbital apex causing optic nerve impingement detected.  No intracranial hemorrhage or evidence of cervical spine fracture. Cervical spine exam is slightly limited by motion degradation.  These results were called by telephone at the time of interpretation on 11/29/2013 at time of imaging to Dr. Elnora Morrison , who verbally acknowledged these results.   Electronically Signed   By: Chauncey Cruel M.D.   On: 11/29/2013 23:43   Dg Chest Port 1 View  11/29/2013   CLINICAL DATA:  Level 2 trauma from fall.  Initial encounter  EXAM: PORTABLE CHEST - 1 VIEW  COMPARISON:  09/16/2011  FINDINGS: Mild cardiomegaly, accentuated by technique. Aortic contours appear similar to 2013. Right perihilar bandlike opacity. No evidence of hemothorax, pneumothorax, or lung contusion. No fracture appreciated.  IMPRESSION: 1. No definite injury. Sensitivity is decreased by low lung volumes, if CT imaging is not planned recommend repeat upright chest radiography. 2. Mild right perihilar  atelectasis.   Electronically Signed   By: Jorje Guild M.D.   On: 11/29/2013 23:49   Ct Maxillofacial Wo Cm  11/29/2013   CLINICAL DATA:  48 year old female post fall appear Right orbital injury with elevated right orbital pressure. Insert initial  EXAM: CT HEAD WITHOUT CONTRAST  CT MAXILLOFACIAL WITHOUT CONTRAST  CT CERVICAL SPINE WITHOUT CONTRAST  CT ORBITS WITHOUT CONTRAST  TECHNIQUE: Multidetector CT imaging of the head, cervical spine, and maxillofacial structures were performed using the standard protocol without intravenous contrast. Multiplanar CT image reconstructions of the cervical spine and maxillofacial structures were also generated.  COMPARISON:  06/15/2013 and 03/05/2013 head CT.  FINDINGS: CT HEAD FINDINGS  Complex right orbital region fracture as discussed below. No intracranial hemorrhage.  No CT evidence of large acute infarct.  No hydrocephalus.  No intracranial mass lesion noted on this unenhanced exam.  CT MAXILLOFACIAL FINDINGS  Complex right orbital region fracture as discussed below.  CT CERVICAL SPINE FINDINGS  Mild motion degradation C5 through T2. Taking this mild limitation into account, no cervical spine fracture or malalignment noted.  No abnormal prevertebral soft tissue swelling.  CT ORBIT FINDINGS  Complex fracture right orbital region. This includes a comminuted inferiorly displaced right orbital floor fracture, fracture of the medial wall of the right orbit/ethmoid sinus air cells, fracture of the right nasal bone, right lacrimal duct bony landmarks and anterior wall of the right maxillary sinus. Inferior displaced right orbital floor fracture fragments. Rotation of fracture fragments at the level of the nasal lacrimal duct/ nasal bones/medial orbital floor/ethmoid sinus air cell fracture fragment.  Hemorrhage within the right maxillary sinus. Hemorrhage and subcutaneous emphysema inferior to the right inferior rectus muscle/ inferior oblique complex which is not  entrapped within the bony of fracture.  Exophthalmos with right preseptal hematoma with hematoma extending slightly in a postseptal position surrounding the insertion sites of the extra-ocular muscles. Right orbital emphysema. Elongated right optic nerve without discrete peri optic hematoma noted. No fracture at the level of the orbital apex causing optic nerve impingement detected.  Fluid within the right sphenoid sinus air cell. No adjacent skull base fracture.  IMPRESSION: CT ORBIT/ MAXILLOFACIAL:  Complex fracture right orbital region. This includes a comminuted inferiorly displaced right orbital floor fracture, fracture of the medial wall of the right orbit/ethmoid sinus air cells, fracture of the right nasal bone, right lacrimal duct bony landmarks and anterior wall of the right maxillary sinus. Inferior displaced right orbital floor fracture fragments. Rotation of  fracture fragments at the level of the nasal lacrimal duct/ nasal bones/medial orbital floor/ethmoid sinus air cell fracture fragments.  Hemorrhage within the right maxillary sinus. Hemorrhage and subcutaneous emphysema inferior to the right inferior rectus muscle/ inferior oblique complex which is not entrapped within the bony of fracture.  Exophthalmos with right preseptal hematoma with hematoma extending slightly in a postseptal position surrounding the insertion sites of the extra-ocular muscles. Right orbital emphysema. Elongated right optic nerve without discrete peri optic hematoma noted. No fracture at the level of the orbital apex causing optic nerve impingement detected.  No intracranial hemorrhage or evidence of cervical spine fracture. Cervical spine exam is slightly limited by motion degradation.  These results were called by telephone at the time of interpretation on 11/29/2013 at time of imaging to Dr. Elnora Morrison , who verbally acknowledged these results.   Electronically Signed   By: Chauncey Cruel M.D.   On: 11/29/2013 23:43   Ct  Orbitss W/o Cm  11/29/2013   CLINICAL DATA:  48 year old female post fall appear Right orbital injury with elevated right orbital pressure. Insert initial  EXAM: CT HEAD WITHOUT CONTRAST  CT MAXILLOFACIAL WITHOUT CONTRAST  CT CERVICAL SPINE WITHOUT CONTRAST  CT ORBITS WITHOUT CONTRAST  TECHNIQUE: Multidetector CT imaging of the head, cervical spine, and maxillofacial structures were performed using the standard protocol without intravenous contrast. Multiplanar CT image reconstructions of the cervical spine and maxillofacial structures were also generated.  COMPARISON:  06/15/2013 and 03/05/2013 head CT.  FINDINGS: CT HEAD FINDINGS  Complex right orbital region fracture as discussed below. No intracranial hemorrhage.  No CT evidence of large acute infarct.  No hydrocephalus.  No intracranial mass lesion noted on this unenhanced exam.  CT MAXILLOFACIAL FINDINGS  Complex right orbital region fracture as discussed below.  CT CERVICAL SPINE FINDINGS  Mild motion degradation C5 through T2. Taking this mild limitation into account, no cervical spine fracture or malalignment noted.  No abnormal prevertebral soft tissue swelling.  CT ORBIT FINDINGS  Complex fracture right orbital region. This includes a comminuted inferiorly displaced right orbital floor fracture, fracture of the medial wall of the right orbit/ethmoid sinus air cells, fracture of the right nasal bone, right lacrimal duct bony landmarks and anterior wall of the right maxillary sinus. Inferior displaced right orbital floor fracture fragments. Rotation of fracture fragments at the level of the nasal lacrimal duct/ nasal bones/medial orbital floor/ethmoid sinus air cell fracture fragment.  Hemorrhage within the right maxillary sinus. Hemorrhage and subcutaneous emphysema inferior to the right inferior rectus muscle/ inferior oblique complex which is not entrapped within the bony of fracture.  Exophthalmos with right preseptal hematoma with hematoma  extending slightly in a postseptal position surrounding the insertion sites of the extra-ocular muscles. Right orbital emphysema. Elongated right optic nerve without discrete peri optic hematoma noted. No fracture at the level of the orbital apex causing optic nerve impingement detected.  Fluid within the right sphenoid sinus air cell. No adjacent skull base fracture.  IMPRESSION: CT ORBIT/ MAXILLOFACIAL:  Complex fracture right orbital region. This includes a comminuted inferiorly displaced right orbital floor fracture, fracture of the medial wall of the right orbit/ethmoid sinus air cells, fracture of the right nasal bone, right lacrimal duct bony landmarks and anterior wall of the right maxillary sinus. Inferior displaced right orbital floor fracture fragments. Rotation of fracture fragments at the level of the nasal lacrimal duct/ nasal bones/medial orbital floor/ethmoid sinus air cell fracture fragments.  Hemorrhage within the right maxillary  sinus. Hemorrhage and subcutaneous emphysema inferior to the right inferior rectus muscle/ inferior oblique complex which is not entrapped within the bony of fracture.  Exophthalmos with right preseptal hematoma with hematoma extending slightly in a postseptal position surrounding the insertion sites of the extra-ocular muscles. Right orbital emphysema. Elongated right optic nerve without discrete peri optic hematoma noted. No fracture at the level of the orbital apex causing optic nerve impingement detected.  No intracranial hemorrhage or evidence of cervical spine fracture. Cervical spine exam is slightly limited by motion degradation.  These results were called by telephone at the time of interpretation on 11/29/2013 at time of imaging to Dr. Elnora Morrison , who verbally acknowledged these results.   Electronically Signed   By: Chauncey Cruel M.D.   On: 11/29/2013 23:43     EKG Interpretation None      MDM   MDM: 48 year old female presents to the first  department after falling on face while intoxicated. On arrival patient is a nonlevel trauma but was leveled on arrival due to altered mental status and concern for vision loss. On initial assessment ABCs are intact. Patient is somnolent but appears to have only significant injuries of the right face and orbit. Her right eye is notably dilated with significant proptosis. Patient subjectively complained of diminished vision. In setting of obvious facial trauma , dilated pupil, vision loss, elevated intraocular pressures compared to left a decision was made to perform a lateral canthotomy to minimize potential vision loss. Patient was not able to consent for this procedure due to intoxication and somnolence however this was felt to be the best position for her in the setting of trauma. It was performed as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

## 2013-12-01 ENCOUNTER — Other Ambulatory Visit: Payer: Self-pay | Admitting: Family Medicine

## 2013-12-01 DIAGNOSIS — Z1231 Encounter for screening mammogram for malignant neoplasm of breast: Secondary | ICD-10-CM

## 2013-12-01 MED ORDER — OXYCODONE HCL 5 MG PO TABS: 5.0000 mg | ORAL_TABLET | ORAL | Status: DC | PRN

## 2013-12-01 MED ORDER — ACETAMINOPHEN 325 MG PO TABS: 650.0000 mg | ORAL_TABLET | ORAL | Status: DC | PRN
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
## 2013-12-01 NOTE — Progress Notes (Signed)
Up walking in halls
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
## 2013-12-01 NOTE — Progress Notes (Signed)
Pt discharged via w/c with belongings to home via cab
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
## 2013-12-01 NOTE — Progress Notes (Signed)
Brittany KidneyKimberley [name] to be D/C'd Home per MD order.  Discussed with the patient and all questions fully answered.    Medication List         acetaminophen 325 MG tablet  Commonly known as:  TYLENOL  Take 2 tablets (650 mg total) by mouth every 4 (four) hours as needed for mild pain.     butalbital-acetaminophen-caffeine 50-325-40 MG per tablet  Commonly known as:  FIORICET, ESGIC  Take 1 tablet by mouth every 6 (six) hours as needed for headache.     DULoxetine 60 MG capsule  Commonly known as:  CYMBALTA  Take 60 mg by mouth daily.     gabapentin 600 MG tablet  Commonly known as:  NEURONTIN  Take 600 mg by mouth 3 (three) times daily.     levothyroxine 50 MCG tablet  Commonly known as:  SYNTHROID, LEVOTHROID  Take 50 mcg by mouth daily.     mupirocin ointment 2 %  Commonly known as:  BACTROBAN  Apply 1 application topically 3 (three) times daily. For right knee     oxyCODONE 5 MG immediate release tablet  Commonly known as:  Oxy IR/ROXICODONE  Take 1 tablet (5 mg total) by mouth every 4 (four) hours as needed for moderate pain.     pravastatin 40 MG tablet  Commonly known as:  PRAVACHOL  Take 40 mg by mouth daily.     Vitamin D-3 5000 UNITS Tabs  Take 5,000 Units by mouth daily.        VVS, Skin clean, dry and intact without evidence of skin break down, no evidence of skin tears noted. IV catheter discontinued intact. Site without signs and symptoms of complications. Dressing and pressure applied.  An After Visit Summary was printed and given to the patient.  D/c education completed with patient/family including follow up instructions, medication list, d/c activities limitations if indicated, with other d/c instructions as indicated by MD - patient able to verbalize understanding, all questions fully answered.   Patient instructed to return to ED, call 911, or call MD for any changes in condition.   Waiting on medicaid transport for transportation home. Will be  discharged via w/c with belongings.  Osvaldo HumanSchiller, [name] 12/01/2013 11:43 AM

## 2013-12-01 NOTE — Progress Notes (Signed)
PTD # 2, fall with right inferior orbital rim, piriform rim, and orbital floor fractures  Evaluated by ophthalmology, no dilated exam in chart  Temp:  [97.3 F (36.3 C)-98.5 F (36.9 C)] 97.5 F (36.4 C) (10/19 0749) Pulse Rate:  [73-93] 86 (10/19 0752) Resp:  [9-20] 11 (10/19 0752) BP: (97-128)/(57-86) 128/75 mmHg (10/19 0749) SpO2:  [93 %-100 %] 98 % (10/19 0752) Weight:  [98.2 kg (216 lb 7.9 oz)] 98.2 kg (216 lb 7.9 oz) (10/18 1145)  Denies diplopia, reading book States she wears glasses for distance  PE More alert, decreased edema EOMI Hand held Snellen chart: 20/50 bilateral Able to raise brows symmetrically, small diminished sensation over right cheek  A/P Injures as above No visual symptoms presently. Inferior orbital rim has been pushed in and this may become noticeable when swelling resolves. Will recheck in one week in clinic. No plan for immediate surgical intervention. Ok to shower. Keep head elevated, no strenuous activity until follow up.   Glenna FellowsBrinda Jazmin Ley, MD [facility] [phone]

## 2013-12-01 NOTE — Discharge Instructions (Signed)
Do not do any strenuous activity until you see the plastic surgeon.    Keep your head elevated.  You were provided with a prescription for pain medication, please do not operate any heavy machinery or mix with any other medication.

## 2013-12-02 ENCOUNTER — Other Ambulatory Visit: Payer: Self-pay | Admitting: Family Medicine

## 2013-12-02 DIAGNOSIS — Z1231 Encounter for screening mammogram for malignant neoplasm of breast: Secondary | ICD-10-CM

## 2013-12-04 ENCOUNTER — Ambulatory Visit: Payer: Self-pay

## 2013-12-13 ENCOUNTER — Encounter (HOSPITAL_COMMUNITY): Payer: Self-pay | Admitting: Emergency Medicine

## 2013-12-13 ENCOUNTER — Emergency Department (HOSPITAL_COMMUNITY)
Admission: EM | Admit: 2013-12-13 | Discharge: 2013-12-13 | Disposition: A | Discharge status: Home / Self Care | Payer: Medicaid Other | Attending: Emergency Medicine | Admitting: Emergency Medicine

## 2013-12-13 DIAGNOSIS — S0083XA Contusion of other part of head, initial encounter: Secondary | ICD-10-CM | POA: Diagnosis not present

## 2013-12-13 DIAGNOSIS — K029 Dental caries, unspecified: Secondary | ICD-10-CM | POA: Diagnosis not present

## 2013-12-13 DIAGNOSIS — I10 Essential (primary) hypertension: Secondary | ICD-10-CM | POA: Insufficient documentation

## 2013-12-13 DIAGNOSIS — G43909 Migraine, unspecified, not intractable, without status migrainosus: Secondary | ICD-10-CM | POA: Insufficient documentation

## 2013-12-13 DIAGNOSIS — E049 Nontoxic goiter, unspecified: Secondary | ICD-10-CM | POA: Insufficient documentation

## 2013-12-13 DIAGNOSIS — W19XXXA Unspecified fall, initial encounter: Secondary | ICD-10-CM | POA: Insufficient documentation

## 2013-12-13 DIAGNOSIS — Z8739 Personal history of other diseases of the musculoskeletal system and connective tissue: Secondary | ICD-10-CM | POA: Diagnosis not present

## 2013-12-13 DIAGNOSIS — Z79899 Other long term (current) drug therapy: Secondary | ICD-10-CM | POA: Diagnosis not present

## 2013-12-13 DIAGNOSIS — S023XXD Fracture of orbital floor, subsequent encounter for fracture with routine healing: Secondary | ICD-10-CM | POA: Diagnosis not present

## 2013-12-13 DIAGNOSIS — R519 Headache, unspecified: Secondary | ICD-10-CM

## 2013-12-13 DIAGNOSIS — R51 Headache: Secondary | ICD-10-CM

## 2013-12-13 DIAGNOSIS — F319 Bipolar disorder, unspecified: Secondary | ICD-10-CM | POA: Diagnosis not present

## 2013-12-13 DIAGNOSIS — F419 Anxiety disorder, unspecified: Secondary | ICD-10-CM | POA: Diagnosis not present

## 2013-12-13 MED ORDER — FENTANYL CITRATE 0.05 MG/ML IJ SOLN
50.0000 ug | Freq: Once | INTRAMUSCULAR | Status: AC
  Administered 2013-12-13: 50 ug via INTRAMUSCULAR
  Filled 2013-12-13: qty 2

## 2013-12-13 MED ORDER — HYDROCODONE-ACETAMINOPHEN 5-325 MG PO TABS: 1.0000 | ORAL_TABLET | ORAL | Status: DC | PRN

## 2013-12-13 NOTE — ED Notes (Addendum)
PT ambulatory to desk to call for ride to use telephone to call for ride home.Pt demanding a ride home per taxi

## 2013-12-13 NOTE — ED Provider Notes (Signed)
Medical screening examination/treatment/procedure(s) were performed by non-physician practitioner and as supervising physician I was immediately available for consultation/collaboration.   EKG Interpretation None        Sachiko Methot M Nandini Bogdanski, MD 12/13/13 1126 

## 2013-12-13 NOTE — ED Notes (Signed)
Keys have been returned by EMS Staff . The keys were handed to PT and family by Self.
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
## 2013-12-13 NOTE — ED Notes (Signed)
Pt arrived per EMS and No personal items were given to staff.

## 2013-12-13 NOTE — Discharge Instructions (Signed)
Take the prescribed medication as directed. Follow-up with your plastic surgeon on Monday as scheduled. Return to the ED for new or worsening symptoms.

## 2013-12-13 NOTE — ED Notes (Signed)
Pt keys have been located on the EMS truck. Keys are on return from EMS truck.

## 2013-12-30 ENCOUNTER — Emergency Department (HOSPITAL_COMMUNITY)
Admission: EM | Admit: 2013-12-30 | Discharge: 2013-12-30 | Discharge status: Against Medical Advice | Payer: Medicaid Other | Attending: Emergency Medicine | Admitting: Emergency Medicine

## 2013-12-30 DIAGNOSIS — I10 Essential (primary) hypertension: Secondary | ICD-10-CM | POA: Diagnosis not present

## 2013-12-30 DIAGNOSIS — H5711 Ocular pain, right eye: Secondary | ICD-10-CM | POA: Insufficient documentation

## 2013-12-30 NOTE — ED Notes (Signed)
Pt came to nurse first desk asking to use phone to call her boyfriend.  Pt attempted to call x's 2 without success.  Pt then said she wanted me to call her a cab.  St's she is leaving.
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
## 2014-04-06 ENCOUNTER — Emergency Department (HOSPITAL_COMMUNITY): Payer: Medicaid Other

## 2014-04-06 ENCOUNTER — Encounter (HOSPITAL_COMMUNITY): Payer: Self-pay | Admitting: Emergency Medicine

## 2014-04-06 ENCOUNTER — Emergency Department (HOSPITAL_COMMUNITY)
Admission: EM | Admit: 2014-04-06 | Discharge: 2014-04-07 | Disposition: A | Discharge status: Home / Self Care | Payer: Medicaid Other | Attending: Emergency Medicine | Admitting: Emergency Medicine

## 2014-04-06 DIAGNOSIS — I10 Essential (primary) hypertension: Secondary | ICD-10-CM | POA: Insufficient documentation

## 2014-04-06 DIAGNOSIS — Z792 Long term (current) use of antibiotics: Secondary | ICD-10-CM | POA: Insufficient documentation

## 2014-04-06 DIAGNOSIS — G43909 Migraine, unspecified, not intractable, without status migrainosus: Secondary | ICD-10-CM | POA: Insufficient documentation

## 2014-04-06 DIAGNOSIS — S0101XA Laceration without foreign body of scalp, initial encounter: Secondary | ICD-10-CM | POA: Insufficient documentation

## 2014-04-06 DIAGNOSIS — Y9389 Activity, other specified: Secondary | ICD-10-CM | POA: Diagnosis not present

## 2014-04-06 DIAGNOSIS — Y92009 Unspecified place in unspecified non-institutional (private) residence as the place of occurrence of the external cause: Secondary | ICD-10-CM | POA: Diagnosis not present

## 2014-04-06 DIAGNOSIS — F319 Bipolar disorder, unspecified: Secondary | ICD-10-CM | POA: Insufficient documentation

## 2014-04-06 DIAGNOSIS — Z8739 Personal history of other diseases of the musculoskeletal system and connective tissue: Secondary | ICD-10-CM | POA: Insufficient documentation

## 2014-04-06 DIAGNOSIS — F419 Anxiety disorder, unspecified: Secondary | ICD-10-CM | POA: Diagnosis not present

## 2014-04-06 DIAGNOSIS — W1839XA Other fall on same level, initial encounter: Secondary | ICD-10-CM | POA: Diagnosis not present

## 2014-04-06 DIAGNOSIS — R4182 Altered mental status, unspecified: Secondary | ICD-10-CM

## 2014-04-06 DIAGNOSIS — Z79899 Other long term (current) drug therapy: Secondary | ICD-10-CM | POA: Diagnosis not present

## 2014-04-06 DIAGNOSIS — Y998 Other external cause status: Secondary | ICD-10-CM | POA: Insufficient documentation

## 2014-04-06 DIAGNOSIS — E039 Hypothyroidism, unspecified: Secondary | ICD-10-CM | POA: Diagnosis not present

## 2014-04-06 DIAGNOSIS — Z3202 Encounter for pregnancy test, result negative: Secondary | ICD-10-CM | POA: Insufficient documentation

## 2014-04-06 DIAGNOSIS — E049 Nontoxic goiter, unspecified: Secondary | ICD-10-CM | POA: Insufficient documentation

## 2014-04-06 DIAGNOSIS — S0990XA Unspecified injury of head, initial encounter: Secondary | ICD-10-CM | POA: Diagnosis present

## 2014-04-06 LAB — CBC WITH DIFFERENTIAL/PLATELET
BASOS ABS: 0 10*3/uL (ref 0.0–0.1)
BASOS PCT: 0 % (ref 0–1)
Eosinophils Absolute: 0.1 10*3/uL (ref 0.0–0.7)
Eosinophils Relative: 1 % (ref 0–5)
HEMATOCRIT: 35.4 % — AB (ref 36.0–46.0)
Hemoglobin: 11.6 g/dL — ABNORMAL LOW (ref 12.0–15.0)
LYMPHS PCT: 24 % (ref 12–46)
Lymphs Abs: 1.6 10*3/uL (ref 0.7–4.0)
MCH: 30.9 pg (ref 26.0–34.0)
MCHC: 32.8 g/dL (ref 30.0–36.0)
MCV: 94.4 fL (ref 78.0–100.0)
Monocytes Absolute: 0.6 10*3/uL (ref 0.1–1.0)
Monocytes Relative: 8 % (ref 3–12)
Neutro Abs: 4.6 10*3/uL (ref 1.7–7.7)
Neutrophils Relative %: 67 % (ref 43–77)
PLATELETS: 274 10*3/uL (ref 150–400)
RBC: 3.75 MIL/uL — AB (ref 3.87–5.11)
RDW: 14.6 % (ref 11.5–15.5)
WBC: 6.9 10*3/uL (ref 4.0–10.5)

## 2014-04-06 LAB — COMPREHENSIVE METABOLIC PANEL
ALT: 19 U/L (ref 0–35)
AST: 24 U/L (ref 0–37)
Albumin: 4.2 g/dL (ref 3.5–5.2)
Alkaline Phosphatase: 81 U/L (ref 39–117)
Anion gap: 13 (ref 5–15)
BUN: 16 mg/dL (ref 6–23)
CO2: 24 mmol/L (ref 19–32)
CREATININE: 0.8 mg/dL (ref 0.50–1.10)
Calcium: 9.5 mg/dL (ref 8.4–10.5)
Chloride: 100 mmol/L (ref 96–112)
GFR calc non Af Amer: 86 mL/min — ABNORMAL LOW (ref >90)
GLUCOSE: 90 mg/dL (ref 70–99)
POTASSIUM: 4.1 mmol/L (ref 3.5–5.1)
Sodium: 137 mmol/L (ref 135–145)
Total Bilirubin: 0.6 mg/dL (ref 0.3–1.2)
Total Protein: 7.5 g/dL (ref 6.0–8.3)

## 2014-04-06 LAB — I-STAT BETA HCG BLOOD, ED (MC, WL, AP ONLY): I-stat hCG, quantitative: <5 m[IU]/mL (ref <5)

## 2014-04-06 MED ORDER — NALOXONE HCL 0.4 MG/ML IJ SOLN
0.4000 mg | Freq: Once | INTRAMUSCULAR | Status: AC
  Administered 2014-04-06: 0.4 mg via INTRAVENOUS
  Filled 2014-04-06: qty 1

## 2014-04-06 MED ORDER — LORAZEPAM 2 MG/ML IJ SOLN
1.0000 mg | Freq: Once | INTRAMUSCULAR | Status: AC
  Administered 2014-04-07: 1 mg via INTRAVENOUS
  Filled 2014-04-06: qty 1

## 2014-04-06 NOTE — ED Notes (Signed)
EMS reports pt fell 45 minutes prior to arrival of EMS. EMS reports pt's hair is covered in blood, unsure of where the bleeding is coming from. EMS reports the pt WAS A&O X4 when the first arrived, pt is withholding information about the injury. EMS reports pt increasingly getting more confused since time of arrival. EMS also reports pt took Xanax at some point today however unsure of how much or where. Pt lethargic upon arrival to department, pupils sluggish, and responsive to painful stimuli.

## 2014-04-06 NOTE — ED Notes (Signed)
Patient transported to CT 

## 2014-04-06 NOTE — ED Notes (Addendum)
Pt returned to room. Pt monitored by pulse ox, bp cuff, and 5-lead. Pt snoring and alert to painful stimuli (sternal rub).

## 2014-04-06 NOTE — ED Notes (Signed)
Pt snoring loudly in room. Pt desated to 87% while in room on room air. [place] placed on pt's mouth at 5LPM due to pt's mouth-breating. RN Becki approved. Pt's saturation rose to 95%.
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
## 2014-04-07 ENCOUNTER — Emergency Department (HOSPITAL_COMMUNITY): Payer: Medicaid Other

## 2014-04-07 LAB — RAPID URINE DRUG SCREEN, HOSP PERFORMED
AMPHETAMINES: NOT DETECTED
BENZODIAZEPINES: POSITIVE — AB
Barbiturates: POSITIVE — AB
Cocaine: NOT DETECTED
Opiates: NOT DETECTED
Tetrahydrocannabinol: NOT DETECTED

## 2014-04-07 LAB — ETHANOL

## 2014-04-07 NOTE — ED Notes (Addendum)
Patient uncooperative.  Stated she was not going to leave until she gets to see the doctor.  States she is a patient also and will not leave until she sees the doctor.  Dr Ranae PalmsYelverton aware

## 2014-04-07 NOTE — Discharge Instructions (Signed)

## 2014-04-07 NOTE — ED Notes (Signed)
Patient refuses to let me do an In and Out cath. Placed pt on bedpan.

## 2014-04-07 NOTE — ED Provider Notes (Signed)
Patient is now more alert. She has a normal neurologic exam. Cervical spine is cleared. UDS positive for benzodiazepines and barbiturates. Discharge home. Return precautions given. Patient understands need to have staples removed in 5 days.  Loren Raceravid Murtaza Shell, MD 04/07/14 [phone]
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
## 2014-06-07 NOTE — H&P (Signed)
PATIENT NAME:  Brittany [name], Brittany [name] MR#:  [id number] DATE OF BIRTH:  May 26, 1965  DATE OF ADMISSION:  04/26/2011  PRIMARY CARE PHYSICIAN: Evelene CroonMeindert Niemeyer, MD   CHIEF COMPLAINT: Altered mental status and drug overdose.   HISTORY OF PRESENT ILLNESS: [name] is a 49 year old Caucasian female with past medical history of psychiatric background including bipolar disorder, posttraumatic stress disorder, panic disorder with agoraphobia, and history of polysubstance abuse. The patient was noticed today to have altered mental status and she was talking in a weird way so the family called the ambulance and patient was transported to the Emergency Department. Evaluation here showed high level of Tylenol and [facility] was notified and they recommended that the patient needs to be admitted as inpatient. The patient herself denies taking overdose. She does not know why the Tylenol level is high, that she took only 3 tablets and some Vicodin as well. She denies any suicidal attempt or an attempt to harm herself. Her mental status looks clearer here during my evaluation. She has with her involuntary commitment papers. The patient was admitted for further treatment of her drug overdose and follow-up on her neurologic status.   REVIEW OF SYSTEMS: CONSTITUTIONAL: Denies any fever. No chills. No night sweats. No fatigue. EYES: Denies any blurring of vision. No double vision. ENT: No hearing impairment. No sore throat. No dysphagia. CARDIOVASCULAR: No chest pain. No shortness of breath. No edema. No syncope. RESPIRATORY: No shortness of breath. No cough. No sputum production. GASTROINTESTINAL: No abdominal pain. No nausea. No vomiting. No diarrhea. GENITOURINARY: No dysuria or frequency of urination. MUSCULOSKELETAL: No joint pain or swelling. No muscular pain or swelling. INTEGUMENTARY: No skin rash or ulcers. NEUROLOGY: No focal weakness. No seizure activity. No headache now but she has history of migraine headaches.  PSYCHIATRY: She has history of anxiety. She denies depression. History of bipolar disorder. ENDOCRINE: No heat or cold intolerance. No polyuria or polydipsia.   PAST MEDICAL HISTORY:  1. History of bipolar disorder.  2. History of posttraumatic stress disorder.  3. History of panic disorder.  4. History of polysubstance abuse.  5. History of migraine headaches.  6. History of chronic back pain. 7. History of pseudoseizures.  8. Hypothyroidism.  PAST SURGICAL HISTORY: History of hysterectomy.   SOCIAL HABITS: Denies tobacco abuse. No alcohol abuse. In the past she had used occasionally cocaine and occasionally marijuana. There is also history of opioid dependence and benzodiazepines.   FAMILY HISTORY: Unremarkable reporting that her father suffered from Parkinson's disease. She has a brother who has diabetes mellitus.   FAMILY PSYCHIATRIC HISTORY: Her mother had problems with mental illness. She is seeing a psychiatrist.   ADMISSION MEDICATIONS:  1. Synthroid 50 mcg once a day. 2. Cymbalta 60 mg a day.  3. Gabapentin 800 mg 4 times a day. 4. Xanax 1 mg 4 times a day.   ALLERGIES: No known drug allergies, although in the records it was mentioned that she was allergic to penicillin and Toradol. The patient denies that this is accurate.   PHYSICAL EXAMINATION:   VITAL SIGNS: Blood pressure 115/64, respiratory rate 16, pulse 64, temperature 95.9, oxygen saturation 96%.   GENERAL APPEARANCE: Young female laying in bed, alert and cooperative in no acute distress.   HEAD: No pallor. No icterus. No cyanosis.   EARS, NOSE, AND THROAT: Hearing was normal. Nasal mucosa, lips, tongue were normal.   EYES: Normal eyelids and conjunctivae. Pupils about 5 to 6 mm, equal and reactive to light.  NECK: Supple. Trachea at midline. No thyromegaly. No cervical lymphadenopathy. No masses.   HEART: Normal S1, S2. No S3, S4. No murmur. No gallop. No carotid bruits.   RESPIRATORY: Normal breathing  pattern without use of accessory muscles. No rales. No wheezing.   ABDOMEN: Soft without tenderness. No hepatosplenomegaly. No masses. No hernias.   SKIN: No ulcers. No subcutaneous nodules.   MUSCULOSKELETAL: No joint swelling. No clubbing.   NEUROLOGIC: Cranial nerves II through XII are intact. No focal motor deficit.   PSYCHIATRY: The patient is alert and oriented x3. Mood and affect appear to be normal.   LABORATORY, DIAGNOSTIC, AND RADIOLOGICAL DATA: Serum glucose 76, BUN 10, creatinine 0.6, sodium 135, potassium 4.3. Anion gap was normal at 7. Ethanol level was less than 0.003. Normal liver function tests. Thyroid stimulating hormone was slightly elevated at 5.4. Tylenol level was elevated at 112. Salicylates less than 1. Urine pregnancy test was negative. Urinalysis was negative. CBC showed white count 6000, hemoglobin 12, hematocrit 36, platelet count 235. Drug screen was positive for benzodiazepines and opiates.   ASSESSMENT:  1. Altered mental status appeared to be secondary to drug overdose. The patient denies that she was trying to overdose herself.  2. Tylenol overdose or toxicity. The patient will receive treatment.  3. Long history of psychiatric illness including bipolar disorder, panic disorder, posttraumatic stress disorder.  4. History of polysubstance abuse.  5. History of migraine headaches.  6. History of chronic back pain.  7. History of hypothyroidism.   PLAN:  1. Admit the patient to telemetry. 2. Start treatment with acetylcysteine.  3. Consult Psychiatry.  4. Continue home medications as listed above.   TIME NEEDED TO EVALUATE THIS PATIENT: More than 45 minutes.   ____________________________ Carney Corners. Rudene Re, MD amd:drc D: 04/26/2011 05:00:50 ET T: 04/26/2011 07:42:02 ET JOB#: [id number]  cc: Carney Corners. Rudene Re, MD, <Dictator> [name] A. Lacie Scotts, MD Carney Corners Keysha Damewood MD ELECTRONICALLY SIGNED 04/26/2011 22:29

## 2014-06-07 NOTE — H&P (Signed)
PATIENT NAME:  Brittany [name], Brittany [name] MR#:  [id number] DATE OF BIRTH:  [birth year]  DATE OF ADMISSION:  04/29/2011  Examination performed with R.N. Linda as escort.  HEENT: Pupils equally round and reactive to light and accommodation. Hearing intact bilaterally to finger rub. Oropharynx clear without erythema.   NECK: Supple, nontender.   EXTREMITIES: No cyanosis, clubbing, or edema.   SKIN: Normal turgor. No rash.   RESPIRATORY: Lungs clear to auscultation no wheezing, rhonchi, or crackles.   CARDIOVASCULAR: Regular rate and rhythm. No murmurs, rubs, or gallops.   ABDOMEN: Bowel sounds positive. Soft, nontender.   GENITOURINARY: Deferred.   NEUROLOGIC: Cranial nerves II through XII intact. Sensory, general intact throughout to light touch. Motor strength 5/5 throughout except for the flexion of her joints at the hips and the knees. She does not proceed with effort due to back pain.   Coordination intact by normal gait and deep tendon reflexes normal strength and symmetry throughout. No Babinski.  ____________________________ Adelene AmasJames S. Sharleen Szczesny, MD jsw:ljs D: 05/01/2011 12:00:00 ET    T: 05/01/2011 13:01:38 ET JOB#: [id number]  cc: Adelene AmasJames S. Breella Vanostrand, MD, <Dictator> Lester CarolinaJAMES S Eragon Hammond MD ELECTRONICALLY SIGNED 05/06/2011 20:47
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
## 2014-06-07 NOTE — Discharge Summary (Signed)
PATIENT NAME:  Brittany [name], Brittany [name] MR#:  [id number] DATE OF BIRTH:  02/21/1965  DATE OF ADMISSION:  04/26/2011 DATE OF DISCHARGE:  04/29/2011  TYPE OF DISCHARGE: The patient was transferred to Behavioral Medicine on 04/29/2011.   For a detailed note, please take a look at the history and physical done by Dr. Rudene Rearwish.   DIAGNOSES UPON DISCHARGE:  1. Altered mental status secondary to drug overdose. 2. Drug overdose secondary to Tylenol abuse.  3. Hypothyroidism.  4. Bipolar disorder. 5. Panic disorder. 6. Posttraumatic stress disorder.   DIET: The patient was discharged on a regular diet.   ACTIVITY: As tolerated.   DISCHARGE MEDICATIONS:  1. Tramadol 50 mg q.6 hours as needed.  2. Cymbalta 60 mg daily.  3. Gabapentin 800 mg q.i.d.  4. Phentermine 37.5 mg daily. 5. Latuda 40 mg daily.  6. Xanax 1 mg q.i.d. as needed.  7. Soma 350 mg q.i.d. as needed. 8. Synthroid 75 mcg daily.  9. Lamictal 25 mg daily. Increase 25 mg per day over the week to 150 mg daily.   CONSULTANT DURING THE HOSPITAL COURSE: Dr. Antonietta BreachJames Williford from Psychiatry    PERTINENT STUDIES: Tylenol level noted to be as high as 112.   HOSPITAL COURSE: This is a 49 year old female with medical problems as mentioned above who presented to the hospital on 04/26/2011 secondary to altered mental status secondary to a drug overdose.  1. Drug overdose. The patient presented to the hospital due to increasing Tylenol intake. Her Tylenol level was noted to be as high as 125. The patient was started on Mucomyst, maintained on Mucomyst for about 24 hours or so. The patient's LFTs were normal. Her coags were normal too. Her Tylenol level with Mucomyst did come down. She had no further sequelae of liver dysfunction.  2. Altered mental status. This was secondary to the drug overdose. The patient had taken some increasing amounts of Vicodin. Her urine tox screen was positive for opiates and benzodiazepines. The patient as mentioned  was treated with Mucomyst for her Tylenol overdose. She was kept on the medical floor. Her mental status had pretty much come back to baseline about 24 to 48 hours after admission.   There was an episode while she was on the medical floor where she became quite lethargic but was arousable and hemodynamically stable. A repeat urine tox was done which was positive for benzodiazepine and also tricyclic antidepressants which was not positive last time. Psychiatry has seen the patient and initially said that she did not need to come down for inpatient psychiatric help, although after having re-evaluated her since her episode of altered mental status and lethargy they thought she would benefit from inpatient psychiatric care.  3. Posttraumatic stress disorder/bipolar disorder. The patient was seen by Psychiatry and is currently being discharged to inpatient Behavioral Medicine for further care. While she was on the medical floor, the patient was not kept on any meds as she was lethargic. Therefore, the medications are to be started as per Psychiatry, Dr. Jeanie SewerWilliford.  4. Hypothyroidism. The patient was maintained on Synthroid and she will resume that upon going down to Behavioral Medicine.   CODE STATUS: The patient is a FULL CODE.   TIME SPENT: 40 minutes.  ____________________________ Rolly PancakeVivek J. Cherlynn KaiserSainani, MD vjs:drc D: 05/11/2011 16:27:00 ET T: 05/13/2011 15:48:18 ET JOB#: [id number]  cc: Rolly PancakeVivek J. Cherlynn KaiserSainani, MD, <Dictator> Houston SirenVIVEK J Kolby Schara MD ELECTRONICALLY SIGNED 05/14/2011 8:04

## 2014-06-07 NOTE — Consult Note (Signed)
Patient not at risk to harm self/others. lamictal by 25 mg/ day/ week to 150 mg daily. change in duloxetine. Rocky LinkKen, Psych Triage, for asst with psych MD f/u and living conditions. note to follow.  Electronic Signatures: Lester CarolinaWilliford, Aarushi Hemric S (MD)  (Signed on 13-Mar-13 15:46)  Authored  Last Updated: 13-Mar-13 15:46 by Lester CarolinaWilliford, Emma Birchler S (MD)
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
## 2014-06-07 NOTE — H&P (Signed)
PATIENT NAME:  Brittany [name], Brittany [name] MR#:  [id number] DATE OF BIRTH:  1966-02-07  DATE OF ADMISSION:  04/29/2011   ADDENDUM   REVIEW OF SYSTEMS: Constitutional, head, ears, eyes, nose, and throat, mouth, neurologic, cardiovascular, respiratory, gastrointestinal, genitourinary, skin, musculoskeletal, hematologic, lymphatic, endocrine, and metabolic all unremarkable.   PSYCHIATRIC: Brittany [name] developed some severe catastrophic anxiety on the morning of March 15, resulting in needing 2 mg of Ativan acutely. She was then complaining of visual hallucinations. By the afternoon, she had become acutely sedated and then finally stuporous with dilated pupils. She had her purse in the room with her. She eventually acknowledged the next day that she had taken an overdose; however, it was obvious at the time, given her dilated pupils, that she had taken something with an anticholinergic side effect.   She was kept on telemetry overnight.   She initially declined psychiatric admission with clearly impaired judgment. She was denying that she had made an overdose. She, therefore, had to be involuntarily committed.   PHYSICAL EXAMINATION  VITAL SIGNS: Temperature 97.7, pulse 80, respiratory rate 18, blood pressure 154/98.   MENTAL STATUS EXAM: Brittany [name] is a well-developed, well-nourished, middle-aged white female lying in a supine position in her hospital bed with no abnormal involuntary movements. She is examined with the escort female nurse, Bonita Quin.   She has no cachexia. Her muscle tone is normal. Her grooming and hygiene are normal.   She is alert. Eye contact is good. Concentration is decreased moderately. She does show anxiety with feeling on edge. She is oriented to all spheres. Her memory is intact to immediate, recent, and remote except for the recent blackout secondary to overdose. Her abstraction ability is intact. Her fund of knowledge and use of language and intelligence are normal. Her speech is  moderately pressured with normal prosody. There is no dysarthria. Thought process involves occasional tangentiality, increased rate, often circumferential. No looseness of associations or tangents. Thought Content: She does acknowledge suicidal ideation. However, she can contract for no harm in the hospital. She does acknowledge that she made a second overdose on March 15, which amounted to 2 overdoses within 10 days of each other. She is having on and off hallucinations that are visual; these have greater intensity than flashbacks. She is not having any paranoia at the time of the interview; however, she does wax and wane with paranoia during the full day. Her affect is anxious. Mood is anxious. Insight is partial. Judgment is impaired.   However, she does have the capacity for informed consent regarding medication, treatment, and her need for psychiatric hospitalization in order to get these symptoms under control.   ASSESSMENT:  AXIS I:  Bipolar I disorder, mixed with psychotic features; posttraumatic stress disorder, provisional; history of possible polysubstance abuse.  AXIS II: Deferred.  AXIS III: See past medical history. She does have a history of migraine headaches, chronic back pain, and hypothyroidism.  AXIS IV: Primary support group marital.  AXIS V: Global Assessment of Functioning 30.   Brittany [name] is at risk for suicide outside of the supportive environment of the hospital. She can contract for safety in the hospital.   PLAN:  1. We will continue to titrate her primary mood stabilizer, Lamictal, which will be increased to 50 mg daily this week.  2. She has been maintained on regular Xanax for anti-acute anxiety. Will switch her over to standing Ativan with the goal of tapering as much as possible. Saphris helps with acute anxiety. Also,  Vistaril can be substituted to help reduce the Ativan or other benzodiazepine use.  3. She will be continued on her duloxetine 60 mg daily as her  primary antidepressant; also, the serotonin reuptake inhibition component of duloxetine can help with her anxiety symptoms long-term.   The indications, alternatives, and adverse effects of the following were discussed with the patient: Saphris for anti-psychosis and augmentation and mood stabilization, including the risk of metabolic syndrome and a nonreversible movement disorder; Cymbalta; Ativan, including the risk of dependence. The patient understands and wants to proceed with the medications above.   She will undergo milieu and group psychotherapy.     ____________________________ Adelene AmasJames S. Deidre Carino, MD jsw:al D: 05/01/2011 10:52:00 ET T: 05/01/2011 11:45:07 ET JOB#: [id number]  cc: Adelene AmasJames S. Lela Gell, MD, <Dictator> Lester CarolinaJAMES S Avinash Maltos MD ELECTRONICALLY SIGNED 05/06/2011 20:47
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
## 2014-09-05 ENCOUNTER — Encounter (HOSPITAL_COMMUNITY): Payer: Self-pay | Admitting: Emergency Medicine

## 2014-09-05 ENCOUNTER — Emergency Department (HOSPITAL_COMMUNITY)
Admission: EM | Admit: 2014-09-05 | Discharge: 2014-09-05 | Disposition: A | Discharge status: Home / Self Care | Payer: Medicaid Other | Attending: Emergency Medicine | Admitting: Emergency Medicine

## 2014-09-05 DIAGNOSIS — R51 Headache: Secondary | ICD-10-CM

## 2014-09-05 DIAGNOSIS — E039 Hypothyroidism, unspecified: Secondary | ICD-10-CM | POA: Diagnosis not present

## 2014-09-05 DIAGNOSIS — F319 Bipolar disorder, unspecified: Secondary | ICD-10-CM | POA: Insufficient documentation

## 2014-09-05 DIAGNOSIS — G43909 Migraine, unspecified, not intractable, without status migrainosus: Secondary | ICD-10-CM | POA: Insufficient documentation

## 2014-09-05 DIAGNOSIS — Z87828 Personal history of other (healed) physical injury and trauma: Secondary | ICD-10-CM | POA: Diagnosis not present

## 2014-09-05 DIAGNOSIS — Z792 Long term (current) use of antibiotics: Secondary | ICD-10-CM | POA: Diagnosis not present

## 2014-09-05 DIAGNOSIS — Z79899 Other long term (current) drug therapy: Secondary | ICD-10-CM | POA: Diagnosis not present

## 2014-09-05 DIAGNOSIS — I10 Essential (primary) hypertension: Secondary | ICD-10-CM | POA: Insufficient documentation

## 2014-09-05 DIAGNOSIS — F419 Anxiety disorder, unspecified: Secondary | ICD-10-CM | POA: Insufficient documentation

## 2014-09-05 DIAGNOSIS — R519 Headache, unspecified: Secondary | ICD-10-CM

## 2014-09-05 DIAGNOSIS — Z8739 Personal history of other diseases of the musculoskeletal system and connective tissue: Secondary | ICD-10-CM | POA: Insufficient documentation

## 2014-09-05 MED ORDER — HYDROMORPHONE HCL 1 MG/ML IJ SOLN
1.0000 mg | Freq: Once | INTRAMUSCULAR | Status: AC
  Administered 2014-09-05: 1 mg via INTRAMUSCULAR
  Filled 2014-09-05: qty 1

## 2014-09-05 MED ORDER — OXYCODONE-ACETAMINOPHEN 5-325 MG PO TABS: 1.0000 | ORAL_TABLET | ORAL | Status: DC | PRN
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
## 2014-09-05 NOTE — Discharge Instructions (Signed)
FOLLOW UP WITH YOUR DOCTOR AS SCHEDULED FOR ONGOING PAIN MANAGEMENT.
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
## 2014-09-19 ENCOUNTER — Emergency Department (HOSPITAL_COMMUNITY)
Admission: EM | Admit: 2014-09-19 | Discharge: 2014-09-19 | Disposition: A | Discharge status: Home / Self Care | Payer: Medicaid Other | Attending: Emergency Medicine | Admitting: Emergency Medicine

## 2014-09-19 ENCOUNTER — Encounter (HOSPITAL_COMMUNITY): Payer: Self-pay

## 2014-09-19 DIAGNOSIS — F319 Bipolar disorder, unspecified: Secondary | ICD-10-CM | POA: Diagnosis not present

## 2014-09-19 DIAGNOSIS — F419 Anxiety disorder, unspecified: Secondary | ICD-10-CM | POA: Diagnosis not present

## 2014-09-19 DIAGNOSIS — Z87828 Personal history of other (healed) physical injury and trauma: Secondary | ICD-10-CM | POA: Diagnosis not present

## 2014-09-19 DIAGNOSIS — Z79899 Other long term (current) drug therapy: Secondary | ICD-10-CM | POA: Insufficient documentation

## 2014-09-19 DIAGNOSIS — I10 Essential (primary) hypertension: Secondary | ICD-10-CM | POA: Insufficient documentation

## 2014-09-19 DIAGNOSIS — Z792 Long term (current) use of antibiotics: Secondary | ICD-10-CM | POA: Diagnosis not present

## 2014-09-19 DIAGNOSIS — R197 Diarrhea, unspecified: Secondary | ICD-10-CM | POA: Diagnosis not present

## 2014-09-19 DIAGNOSIS — G8929 Other chronic pain: Secondary | ICD-10-CM | POA: Diagnosis not present

## 2014-09-19 DIAGNOSIS — R51 Headache: Secondary | ICD-10-CM | POA: Diagnosis not present

## 2014-09-19 DIAGNOSIS — E039 Hypothyroidism, unspecified: Secondary | ICD-10-CM | POA: Diagnosis not present

## 2014-09-19 MED ORDER — OXYCODONE-ACETAMINOPHEN 5-325 MG PO TABS
2.0000 | ORAL_TABLET | Freq: Once | ORAL | Status: AC
  Administered 2014-09-19: 2 via ORAL
  Filled 2014-09-19: qty 2
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
## 2014-09-19 NOTE — ED Notes (Signed)
Pt has a ride home.  

## 2014-09-19 NOTE — Discharge Instructions (Signed)
1. Medications: usual home medications 2. Treatment: rest, drink plenty of fluids 3. Follow Up: please followup with the pain clinic as scheduled 09/23/2014 for discussion of your diagnoses and further evaluation after today's visit; please return to the ER for new or worsening symptoms   Chronic Pain Chronic pain can be defined as pain that is off and on and lasts for 3-6 months or longer. Many things cause chronic pain, which can make it difficult to make a diagnosis. There are many treatment options available for chronic pain. However, finding a treatment that works well for you may require trying various approaches until the right one is found. Many people benefit from a combination of two or more types of treatment to control their pain. SYMPTOMS  Chronic pain can occur anywhere in the body and can range from mild to very severe. Some types of chronic pain include:  Headache.  Low back pain.  Cancer pain.  Arthritis pain.  Neurogenic pain. This is pain resulting from damage to nerves. People with chronic pain may also have other symptoms such as:  Depression.  Anger.  Insomnia.  Anxiety. DIAGNOSIS  Your health care provider will help diagnose your condition over time. In many cases, the initial focus will be on excluding possible conditions that could be causing the pain. Depending on your symptoms, your health care provider may order tests to diagnose your condition. Some of these tests may include:   Blood tests.   CT scan.   MRI.   X-rays.   Ultrasounds.   Nerve conduction studies.  You may need to see a specialist.  TREATMENT  Finding treatment that works well may take time. You may be referred to a pain specialist. He or she may prescribe medicine or therapies, such as:   Mindful meditation or yoga.  Shots (injections) of numbing or pain-relieving medicines into the spine or area of pain.  Local electrical stimulation.  Acupuncture.   Massage  therapy.   Aroma, color, light, or sound therapy.   Biofeedback.   Working with a physical therapist to keep from getting stiff.   Regular, gentle exercise.   Cognitive or behavioral therapy.   Group support.  Sometimes, surgery may be recommended.  HOME CARE INSTRUCTIONS   Take all medicines as directed by your health care provider.   Lessen stress in your life by relaxing and doing things such as listening to calming music.   Exercise or be active as directed by your health care provider.   Eat a healthy diet and include things such as vegetables, fruits, fish, and lean meats in your diet.   Keep all follow-up appointments with your health care provider.   Attend a support group with others suffering from chronic pain. SEEK MEDICAL CARE IF:   Your pain gets worse.   You develop a new pain that was not there before.   You cannot tolerate medicines given to you by your health care provider.   You have new symptoms since your last visit with your health care provider.  SEEK IMMEDIATE MEDICAL CARE IF:   You feel weak.   You have decreased sensation or numbness.   You lose control of bowel or bladder function.   Your pain suddenly gets much worse.   You develop shaking.  You develop chills.  You develop confusion.  You develop chest pain.  You develop shortness of breath.  MAKE SURE YOU:  Understand these instructions.  Will watch your condition.  Will get help  right away if you are not doing well or get worse. Document Released: 10/22/2001 Document Revised: 10/02/2012 Document Reviewed: 07/26/2012 [facility] Patient Information 2015 [place], [place]. This information is not intended to replace advice given to you by your health care provider. Make sure you discuss any questions you have with your health care provider.
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
## 2014-12-12 ENCOUNTER — Emergency Department (HOSPITAL_COMMUNITY)
Admission: EM | Admit: 2014-12-12 | Discharge: 2014-12-12 | Discharge status: Against Medical Advice | Payer: Medicaid Other | Attending: Emergency Medicine | Admitting: Emergency Medicine

## 2014-12-12 ENCOUNTER — Encounter (HOSPITAL_COMMUNITY): Payer: Self-pay

## 2014-12-12 ENCOUNTER — Emergency Department (HOSPITAL_COMMUNITY)
Admission: EM | Admit: 2014-12-12 | Discharge: 2014-12-12 | Discharge status: Against Medical Advice | Payer: Medicaid Other | Source: Home / Self Care

## 2014-12-12 DIAGNOSIS — Z79899 Other long term (current) drug therapy: Secondary | ICD-10-CM | POA: Insufficient documentation

## 2014-12-12 DIAGNOSIS — Z8739 Personal history of other diseases of the musculoskeletal system and connective tissue: Secondary | ICD-10-CM | POA: Insufficient documentation

## 2014-12-12 DIAGNOSIS — R51 Headache: Secondary | ICD-10-CM

## 2014-12-12 DIAGNOSIS — Z87828 Personal history of other (healed) physical injury and trauma: Secondary | ICD-10-CM | POA: Insufficient documentation

## 2014-12-12 DIAGNOSIS — I10 Essential (primary) hypertension: Secondary | ICD-10-CM | POA: Insufficient documentation

## 2014-12-12 DIAGNOSIS — G8929 Other chronic pain: Secondary | ICD-10-CM | POA: Diagnosis not present

## 2014-12-12 DIAGNOSIS — E039 Hypothyroidism, unspecified: Secondary | ICD-10-CM | POA: Insufficient documentation

## 2014-12-12 DIAGNOSIS — R519 Headache, unspecified: Secondary | ICD-10-CM

## 2014-12-12 DIAGNOSIS — Z792 Long term (current) use of antibiotics: Secondary | ICD-10-CM | POA: Insufficient documentation

## 2014-12-12 DIAGNOSIS — F319 Bipolar disorder, unspecified: Secondary | ICD-10-CM | POA: Insufficient documentation

## 2014-12-12 DIAGNOSIS — E049 Nontoxic goiter, unspecified: Secondary | ICD-10-CM | POA: Insufficient documentation

## 2014-12-12 DIAGNOSIS — G43909 Migraine, unspecified, not intractable, without status migrainosus: Secondary | ICD-10-CM | POA: Insufficient documentation

## 2014-12-12 DIAGNOSIS — F419 Anxiety disorder, unspecified: Secondary | ICD-10-CM | POA: Insufficient documentation

## 2014-12-12 MED ORDER — OXYCODONE-ACETAMINOPHEN 5-325 MG PO TABS
2.0000 | ORAL_TABLET | Freq: Once | ORAL | Status: AC
  Administered 2014-12-12: 2 via ORAL
  Filled 2014-12-12: qty 2

## 2014-12-12 MED ORDER — METHOCARBAMOL 500 MG PO TABS
500.0000 mg | ORAL_TABLET | Freq: Once | ORAL | Status: AC
  Administered 2014-12-12: 500 mg via ORAL
  Filled 2014-12-12: qty 1

## 2014-12-12 NOTE — Discharge Instructions (Signed)
Neuropathic Pain Keep your follow-up appointment on November 2. Neuropathic pain is pain caused by damage to the nerves that are responsible for certain sensations in your body (sensory nerves). The pain can be caused by damage to:   The sensory nerves that send signals to your spinal cord and brain (peripheral nervous system).  The sensory nerves in your brain or spinal cord (central nervous system). Neuropathic pain can make you more sensitive to pain. What would be a minor sensation for most people may feel very painful if you have neuropathic pain. This is usually a long-term condition that can be difficult to treat. The type of pain can differ from person to person. It may start suddenly (acute), or it may develop slowly and last for a long time (chronic). Neuropathic pain may come and go as damaged nerves heal or may stay at the same level for years. It often causes emotional distress, loss of sleep, and a lower quality of life. CAUSES  The most common cause of damage to a sensory nerve is diabetes. Many other diseases and conditions can also cause neuropathic pain. Causes of neuropathic pain can be classified as:  Toxic. Many drugs and chemicals can cause toxic damage. The most common cause of toxic neuropathic pain is damage from drug treatment for cancer (chemotherapy).  Metabolic. This type of pain can happen when a disease causes imbalances that damage nerves. Diabetes is the most common of these diseases. Vitamin B deficiency caused by long-term alcohol abuse is another common cause.  Traumatic. Any injury that cuts, crushes, or stretches a nerve can cause damage and pain. A common example is feeling pain after losing an arm or leg (phantom limb pain).  Compression-related. If a sensory nerve gets trapped or compressed for a long period of time, the blood supply to the nerve can be cut off.  Vascular. Many blood vessel diseases can cause neuropathic pain by decreasing blood supply and  oxygen to nerves.  Autoimmune. This type of pain results from diseases in which the body's defense system mistakenly attacks sensory nerves. Examples of autoimmune diseases that can cause neuropathic pain include lupus and multiple sclerosis.  Infectious. Many types of viral infections can damage sensory nerves and cause pain. Shingles infection is a common cause of this type of pain.  Inherited. Neuropathic pain can be a symptom of many diseases that are passed down through families (genetic). SIGNS AND SYMPTOMS  The main symptom is pain. Neuropathic pain is often described as:  Burning.  Shock-like.  Stinging.  Hot or cold.  Itching. DIAGNOSIS  No single test can diagnose neuropathic pain. Your health care provider will do a physical exam and ask you about your pain. You may use a pain scale to describe how bad your pain is. You may also have tests to see if you have a high sensitivity to pain and to help find the cause and location of any sensory nerve damage. These tests may include:  Imaging studies, such as:  X-rays.  CT scan.  MRI.  Nerve conduction studies to test how well nerve signals travel through your sensory nerves (electrodiagnostic testing).  Stimulating your sensory nerves through electrodes on your skin and measuring the response in your spinal cord and brain (somatosensory evoked potentials). TREATMENT  Treatment for neuropathic pain may change over time. You may need to try different treatment options or a combination of treatments. Some options include:  Over-the-counter pain relievers.  Prescription medicines. Some medicines used to treat other  conditions may also help neuropathic pain. These include medicines to:  Control seizures (anticonvulsants).  Relieve depression (antidepressants).  Prescription-strength pain relievers (narcotics). These are usually used when other pain relievers do not help.  Transcutaneous nerve stimulation (TENS). This  uses electrical currents to block painful nerve signals. The treatment is painless.  Topical and local anesthetics. These are medicines that numb the nerves. They can be injected as a nerve block or applied to the skin.  Alternative treatments, such as:  Acupuncture.  Meditation.  Massage.  Physical therapy.  Pain management programs.  Counseling. HOME CARE INSTRUCTIONS  Learn as much as you can about your condition.  Take medicines only as directed by your health care provider.  Work closely with all your health care providers to find what works best for you.  Have a good support system at home.  Consider joining a chronic pain support group. SEEK MEDICAL CARE IF:  Your pain treatments are not helping.  You are having side effects from your medicines.  You are struggling with fatigue, mood changes, depression, or anxiety.   This information is not intended to replace advice given to you by your health care provider. Make sure you discuss any questions you have with your health care provider.   Document Released: 10/28/2003 Document Revised: 02/20/2014 Document Reviewed: 07/10/2013 Elsevier Interactive Patient Education [employer]

## 2014-12-12 NOTE — ED Notes (Signed)
Bed: ZO10WA13 Expected date:  Expected time:  Means of arrival:  Comments: Migraine

## 2014-12-12 NOTE — ED Notes (Addendum)
She states she has had migraine with pain and "shock" flashed r/t chronic right-sided facial pain from prior trauma.  She is awake, alert and oriented x 4 with clear speech.

## 2014-12-12 NOTE — ED Provider Notes (Signed)
CSN: [id number]     Arrival date & time 12/12/14  1349 History   First MD Initiated Contact with Patient 12/12/14 1353     Chief Complaint  Patient presents with  . Migraine     (Consider location/radiation/quality/duration/timing/severity/associated sxs/prior Treatment) Patient is a 49 y.o. female presenting with migraines. The history is provided by the patient. No language interpreter was used.  Migraine Pertinent negatives include no fever.  [name] is a 49 y.o female with a history of hypertension, anxiety, back pain, depression, head injury and migraines who presents with facial pain 4 days. She states these are similar to her previous episodes of pain. She states she has chronic right facial pain from prior trauma and right orbital fracture. She showed me pictures on her phone of ecchymosis and swelling around her right eye. She states that since then she has had headaches and facial pain. She states she is on Klonopin which helps with the tightness in her face. She also states that she's told that it was trigeminal neuralgia but that she looked it up online and it is not trigeminal neuralgia. She also mentioned that gabapentin does not help. She states that, "this is a suicide disease but that I'm not suicidal." She requested Dilaudid by name and states that that helps her pain. She denies any fever, vision changes, new injury, fall, or loss of consciousness. Past Medical History  Diagnosis Date  . Hypertension   . Bipolar 1 disorder (HCC)   . Anxiety   . Back pain   . Bulging disc   . Ruptured intervertebral disc   . Migraine   . Head injury   . Short-term memory loss   . Depression   . Hypothyroidism   . Goiter    Past Surgical History  Procedure Laterality Date  . Abdominal hysterectomy     Family History  Problem Relation Age of Onset  . Migraines Mother   . Migraines Father   . Other Father   . Migraines Sister    Social History  Substance Use Topics  .  Smoking status: Never Smoker   . Smokeless tobacco: Never Used  . Alcohol Use: No   OB History    No data available     Review of Systems  Constitutional: Negative for fever.  Eyes: Negative for visual disturbance.  All other systems reviewed and are negative.     Allergies  Toradol; Suboxone; and Trazodone and nefazodone  Home Medications   Prior to Admission medications   Medication Sig Start Date End Date Taking? Authorizing Provider  acetaminophen (TYLENOL) 325 MG tablet Take 2 tablets (650 mg total) by mouth every 4 (four) hours as needed for mild pain. Patient not taking: Reported on 09/19/2014 12/01/13   Ashok NorrisEmina Riebock, NP  cyclobenzaprine (FLEXERIL) 10 MG tablet Take 10 mg by mouth 3 (three) times daily as needed for muscle spasms.    Historical Provider, MD  DULoxetine (CYMBALTA) 60 MG capsule Take 60 mg by mouth daily.    Historical Provider, MD  gabapentin (NEURONTIN) 600 MG tablet Take 600 mg by mouth 3 (three) times daily.  09/25/13   Beau FannyJohn C Withrow, FNP  HYDROcodone-acetaminophen (NORCO/VICODIN) 5-325 MG per tablet Take 1 tablet by mouth every 4 (four) hours as needed. Patient not taking: Reported on 04/07/2014 12/13/13   Garlon HatchetLisa M Sanders, PA-C  ibuprofen (ADVIL,MOTRIN) 800 MG tablet Take 800 mg by mouth every 8 (eight) hours as needed for moderate pain.    Historical Provider,  MD  lamoTRIgine (LAMICTAL) 200 MG tablet Take 200 mg by mouth daily.    Historical Provider, MD  levothyroxine (SYNTHROID, LEVOTHROID) 50 MCG tablet Take 50 mcg by mouth daily.    Historical Provider, MD  mupirocin ointment (BACTROBAN) 2 % Apply 1 application topically 3 (three) times daily. For right knee    Historical Provider, MD  oxyCODONE (OXY IR/ROXICODONE) 5 MG immediate release tablet Take 1 tablet (5 mg total) by mouth every 4 (four) hours as needed for moderate pain. Patient not taking: Reported on 09/19/2014 12/01/13   Ashok Norris, NP  oxyCODONE-acetaminophen (PERCOCET/ROXICET) 5-325 MG  per tablet Take 1-2 tablets by mouth every 4 (four) hours as needed for severe pain. Patient not taking: Reported on 09/19/2014 09/05/14   Elpidio Anis, PA-C  pravastatin (PRAVACHOL) 40 MG tablet Take 40 mg by mouth daily.    Historical Provider, MD   BP 124/71 mmHg  Pulse 81  Temp(Src) 97.7 F (36.5 C) (Oral)  Resp 16  SpO2 95% Physical Exam  Constitutional: She is oriented to person, place, and time. She appears well-developed and well-nourished.  HENT:  Head: Normocephalic and atraumatic.    Mouth/Throat: Oropharynx is clear and moist.  Facial pain as diagrammed with light touch. No rash. No ecchymosis.  Eyes: Conjunctivae are normal.  Neck: Normal range of motion. Neck supple.  Cardiovascular: Normal rate, regular rhythm and normal heart sounds.   Pulmonary/Chest: Effort normal and breath sounds normal. No respiratory distress. She has no wheezes. She has no rales.  Abdominal: There is no tenderness.  Musculoskeletal: Normal range of motion.  Neurological: She is alert and oriented to person, place, and time.  Skin: Skin is warm and dry. No rash noted.  No rash.  Nursing note and vitals reviewed.   ED Course  Procedures (including critical care time) Labs Review Labs Reviewed - No data to display  Imaging Review No results found.   EKG Interpretation None      MDM   Final diagnoses:  Right facial pain  Patient presents for chronic facial pain since orbital fracture that occurred over one year ago. Patient was seen on 09/05/2014 and 09/19/2014 for the same pain. At that time the provider did not write narcotic medication for the patient because she stated she had an appointment. She was looked up in the narcotic database which did not show regular prescriptions for narcotic pain medications. She states that her pain has been worse in the last 4 days but is similar to her pain episodes in the past. Per the previous note she stated that she had an appointment in August  but missed it and had rescheduled it. She denies ever going to that appointment and now states that she has an appointment on November 2 with her primary care physician. She is requesting a shot of Dilaudid. Medications  oxyCODONE-acetaminophen (PERCOCET/ROXICET) 5-325 MG per tablet 2 tablet (2 tablets Oral Given 12/12/14 1450)  methocarbamol (ROBAXIN) tablet 500 mg (500 mg Oral Given 12/12/14 1501)   I discussed with the patient that I would not be giving her narcotic pain medication to go home with but would treat her pain while she was here. She would need to follow-up with a primary care physician or pain management clinic for chronic pain. Upon discharge, patient asked me for flexeril, baclofen, or something else for pain which I declined to do. I discussed keeping her appointment on 12/16/2014. Patient verbally agrees with the plan.   Catha Gosselin, PA-C 12/12/14 1521  Elwin Mocha, MD 12/12/14 [phone]

## 2014-12-12 NOTE — ED Notes (Signed)
She c/o migraine h/a (of which she has hx) began some 4 days ago.  She also cites chronic facial pain from prior injusry as secondary issue.  She ia alert and oriented x 4 with clear speech.

## 2014-12-23 ENCOUNTER — Encounter (HOSPITAL_COMMUNITY): Payer: Self-pay

## 2014-12-23 ENCOUNTER — Emergency Department (HOSPITAL_COMMUNITY)
Admission: EM | Admit: 2014-12-23 | Discharge: 2014-12-24 | Disposition: A | Discharge status: Home / Self Care | Payer: Medicaid Other | Attending: Emergency Medicine | Admitting: Emergency Medicine

## 2014-12-23 DIAGNOSIS — R200 Anesthesia of skin: Secondary | ICD-10-CM | POA: Insufficient documentation

## 2014-12-23 DIAGNOSIS — Z23 Encounter for immunization: Secondary | ICD-10-CM | POA: Insufficient documentation

## 2014-12-23 DIAGNOSIS — E039 Hypothyroidism, unspecified: Secondary | ICD-10-CM | POA: Diagnosis not present

## 2014-12-23 DIAGNOSIS — Y9389 Activity, other specified: Secondary | ICD-10-CM | POA: Diagnosis not present

## 2014-12-23 DIAGNOSIS — G43909 Migraine, unspecified, not intractable, without status migrainosus: Secondary | ICD-10-CM | POA: Insufficient documentation

## 2014-12-23 DIAGNOSIS — Y9289 Other specified places as the place of occurrence of the external cause: Secondary | ICD-10-CM | POA: Diagnosis not present

## 2014-12-23 DIAGNOSIS — S60512A Abrasion of left hand, initial encounter: Secondary | ICD-10-CM | POA: Insufficient documentation

## 2014-12-23 DIAGNOSIS — S60511A Abrasion of right hand, initial encounter: Secondary | ICD-10-CM | POA: Insufficient documentation

## 2014-12-23 DIAGNOSIS — F1310 Sedative, hypnotic or anxiolytic abuse, uncomplicated: Secondary | ICD-10-CM | POA: Diagnosis not present

## 2014-12-23 DIAGNOSIS — R197 Diarrhea, unspecified: Secondary | ICD-10-CM | POA: Insufficient documentation

## 2014-12-23 DIAGNOSIS — Y998 Other external cause status: Secondary | ICD-10-CM | POA: Insufficient documentation

## 2014-12-23 DIAGNOSIS — S80811A Abrasion, right lower leg, initial encounter: Secondary | ICD-10-CM | POA: Insufficient documentation

## 2014-12-23 DIAGNOSIS — R109 Unspecified abdominal pain: Secondary | ICD-10-CM | POA: Insufficient documentation

## 2014-12-23 DIAGNOSIS — R451 Restlessness and agitation: Secondary | ICD-10-CM | POA: Diagnosis not present

## 2014-12-23 DIAGNOSIS — S80812A Abrasion, left lower leg, initial encounter: Secondary | ICD-10-CM | POA: Insufficient documentation

## 2014-12-23 DIAGNOSIS — F310 Bipolar disorder, current episode hypomanic: Secondary | ICD-10-CM | POA: Diagnosis not present

## 2014-12-23 DIAGNOSIS — F319 Bipolar disorder, unspecified: Secondary | ICD-10-CM | POA: Diagnosis present

## 2014-12-23 DIAGNOSIS — I10 Essential (primary) hypertension: Secondary | ICD-10-CM | POA: Insufficient documentation

## 2014-12-23 DIAGNOSIS — R110 Nausea: Secondary | ICD-10-CM | POA: Insufficient documentation

## 2014-12-23 DIAGNOSIS — N939 Abnormal uterine and vaginal bleeding, unspecified: Secondary | ICD-10-CM | POA: Diagnosis present

## 2014-12-23 DIAGNOSIS — R6883 Chills (without fever): Secondary | ICD-10-CM | POA: Insufficient documentation

## 2014-12-23 DIAGNOSIS — F309 Manic episode, unspecified: Secondary | ICD-10-CM | POA: Insufficient documentation

## 2014-12-23 DIAGNOSIS — R319 Hematuria, unspecified: Secondary | ICD-10-CM | POA: Insufficient documentation

## 2014-12-23 DIAGNOSIS — Z79899 Other long term (current) drug therapy: Secondary | ICD-10-CM | POA: Insufficient documentation

## 2014-12-23 DIAGNOSIS — W540XXA Bitten by dog, initial encounter: Secondary | ICD-10-CM | POA: Diagnosis not present

## 2014-12-23 LAB — RAPID URINE DRUG SCREEN, HOSP PERFORMED
AMPHETAMINES: NOT DETECTED
BENZODIAZEPINES: NOT DETECTED
Barbiturates: POSITIVE — AB
COCAINE: NOT DETECTED
OPIATES: NOT DETECTED
Tetrahydrocannabinol: NOT DETECTED

## 2014-12-23 LAB — COMPREHENSIVE METABOLIC PANEL
ALBUMIN: 4.6 g/dL (ref 3.5–5.0)
ALT: 83 U/L — ABNORMAL HIGH (ref 14–54)
AST: 69 U/L — AB (ref 15–41)
Alkaline Phosphatase: 89 U/L (ref 38–126)
Anion gap: 11 (ref 5–15)
BUN: 15 mg/dL (ref 6–20)
CHLORIDE: 104 mmol/L (ref 101–111)
CO2: 24 mmol/L (ref 22–32)
Calcium: 9.3 mg/dL (ref 8.9–10.3)
Creatinine, Ser: 0.69 mg/dL (ref 0.44–1.00)
GFR calc Af Amer: >60 mL/min (ref >60)
GFR calc non Af Amer: >60 mL/min (ref >60)
GLUCOSE: 102 mg/dL — AB (ref 65–99)
POTASSIUM: 3.9 mmol/L (ref 3.5–5.1)
SODIUM: 139 mmol/L (ref 135–145)
Total Bilirubin: 0.7 mg/dL (ref 0.3–1.2)
Total Protein: 8 g/dL (ref 6.5–8.1)

## 2014-12-23 LAB — URINALYSIS, ROUTINE W REFLEX MICROSCOPIC
BILIRUBIN URINE: NEGATIVE
Glucose, UA: NEGATIVE mg/dL
Hgb urine dipstick: NEGATIVE
Ketones, ur: NEGATIVE mg/dL
Leukocytes, UA: NEGATIVE
NITRITE: NEGATIVE
PH: 6.5 (ref 5.0–8.0)
Protein, ur: NEGATIVE mg/dL
SPECIFIC GRAVITY, URINE: 1.003 — AB (ref 1.005–1.030)
Urobilinogen, UA: 0.2 mg/dL (ref 0.0–1.0)

## 2014-12-23 LAB — CBC
HEMATOCRIT: 38.6 % (ref 36.0–46.0)
Hemoglobin: 12.5 g/dL (ref 12.0–15.0)
MCH: 30.3 pg (ref 26.0–34.0)
MCHC: 32.4 g/dL (ref 30.0–36.0)
MCV: 93.7 fL (ref 78.0–100.0)
Platelets: 260 10*3/uL (ref 150–400)
RBC: 4.12 MIL/uL (ref 3.87–5.11)
RDW: 14.9 % (ref 11.5–15.5)
WBC: 7.3 10*3/uL (ref 4.0–10.5)

## 2014-12-23 LAB — ACETAMINOPHEN LEVEL: Acetaminophen (Tylenol), Serum: <10 ug/mL — ABNORMAL LOW (ref 10–30)

## 2014-12-23 LAB — SALICYLATE LEVEL

## 2014-12-23 LAB — LIPASE, BLOOD: LIPASE: 36 U/L (ref 11–51)

## 2014-12-23 LAB — ETHANOL

## 2014-12-23 LAB — TSH: TSH: 4.836 u[IU]/mL — ABNORMAL HIGH (ref 0.350–4.500)

## 2014-12-23 MED ORDER — ZOLPIDEM TARTRATE 5 MG PO TABS: 5.0000 mg | ORAL_TABLET | Freq: Every evening | ORAL | Status: DC | PRN

## 2014-12-23 MED ORDER — NICOTINE 21 MG/24HR TD PT24: 21.0000 mg | MEDICATED_PATCH | Freq: Every day | TRANSDERMAL | Status: DC

## 2014-12-23 MED ORDER — ZIPRASIDONE MESYLATE 20 MG IM SOLR
10.0000 mg | Freq: Four times a day (QID) | INTRAMUSCULAR | Status: DC | PRN
  Administered 2014-12-24: 10 mg via INTRAMUSCULAR
  Filled 2014-12-23: qty 20

## 2014-12-23 MED ORDER — ONDANSETRON HCL 4 MG PO TABS: 4.0000 mg | ORAL_TABLET | Freq: Three times a day (TID) | ORAL | Status: DC | PRN

## 2014-12-23 MED ORDER — ALUM & MAG HYDROXIDE-SIMETH 200-200-20 MG/5ML PO SUSP: 30.0000 mL | ORAL | Status: DC | PRN

## 2014-12-23 MED ORDER — ZIPRASIDONE MESYLATE 20 MG IM SOLR
20.0000 mg | Freq: Once | INTRAMUSCULAR | Status: AC
  Administered 2014-12-23: 20 mg via INTRAMUSCULAR
  Filled 2014-12-23: qty 20

## 2014-12-23 MED ORDER — STERILE WATER FOR INJECTION IJ SOLN
INTRAMUSCULAR | Status: AC
  Administered 2014-12-23: 1.2 mL
  Filled 2014-12-23: qty 10

## 2014-12-23 NOTE — ED Notes (Addendum)
Per EMS, Pt, from home, c/o generalized abdominal pain, nausea, rectal bleeding, and vaginal bleeding after having intercourse this morning, generalized "sores" x "months," and multiple dog bites x 2 days ago.  Pain score 10/10.  Pt reported to EMS that she thinks "he knocked the gallstones loose."

## 2014-12-23 NOTE — ED Notes (Signed)
Radford PaxBeaton MD reports that if the Pt refuses medications or attempts to leave, then he will take out IVC paperwork.
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
## 2014-12-23 NOTE — ED Notes (Signed)
Pt O2 sats drop to 78%on room air; pt head raised and pt stimulated and pt's O2 sats up to 95% but once pt falls asleep the O2 sat drops to 82%; pt placed on 2lpm via [place]

## 2014-12-23 NOTE — ED Notes (Signed)
Pt yelling in the lobby, saying she has a "migraine, her dog bit her, and she has sores all over her body. [facility], screaming about Garnet KoyanagiDonald Trump and everyone is an idiot".

## 2014-12-23 NOTE — ED Notes (Signed)
Patient ambulatory to the restroom.

## 2014-12-23 NOTE — BH Assessment (Signed)
Contacted Berneice Heinrichina Tate, RN, [facility]   Davina PokeJoVea Daleigh Pollinger, LCSW Therapeutic Triage Specialist [facility] 12/23/2014 2:19 PM

## 2014-12-23 NOTE — ED Notes (Addendum)
Patient c/o of generalized abdominal pain, pain scale 10/10.  Patient c/o dog bites X2 days ago.  Patient also c/o vaginal and rectal bleeding from intercourse about 10 hours ago.  Patient in NAD at this time.

## 2014-12-23 NOTE — ED Notes (Addendum)
Pt called this RN into the room and stated that she could not move her arms, due to "cramping."  As this RN attempted to assess the Pt, she began quickly turning head and yelling "no."  Then, Pt straightened both arms.  EDP called to the room.         Pt reported to EDPs that she is "allergic to all anti-depressants." When asked about her reaction, the Pt stated "I do this" and proceeded to do a dance.  This Consulting civil engineerCharge RN confirmed her response by asking "you do a dance" and the Pt responded "yes."

## 2014-12-23 NOTE — Progress Notes (Signed)
[facility] per Olegario MessierKathy, fax ferral, a couple beds available. [facility] - per intake, d/c tomorrow, fax referral tonight. [facility] - per intake, fax referral for review.  [facility] - don't accept adult medicaid.  At capacity: [facility] - per Sherilyn [facility] - per Lyndee HensenAimie Duplin - [place], [facility] [place]  CSW will continue to seek placement.  Melbourne Abtsatia Lincy Belles, LCSWA Disposition staff 12/23/2014 8:13 PM

## 2014-12-23 NOTE — Progress Notes (Signed)
   12/23/14 1200  Clinical Encounter Type  Visited With Patient  Visit Type Initial;Psychological support;Spiritual support;ED  Referral From Chaplain  Consult/Referral To Chaplain  Spiritual Encounters  Spiritual Needs Emotional;Other (Comment) Veterinary surgeon(Pastoral Conversation)  Stress Factors  Patient Stress Factors None identified  Family Stress Factors None identified   The Chaplain learned of the patient via ED Huddle. The Chaplain visited with the patient who was visibly hyper. The patient asked the Chaplain to return once she changed into the hospital gown that the nurse had given her.   The Chaplain plans on returning once the patient is ready.

## 2014-12-23 NOTE — ED Notes (Signed)
Pt continually having flight of ideas.  Pt yelling about Brittany [name], having Medicaid, and something about her butt.  Pt, also, reports that she has "rebound headache" and continually talking about her numerous complaints.  Pt sts "you guys are zig zagging around and I need help.  Can I get some help?"

## 2014-12-23 NOTE — BH Assessment (Signed)
Consulted with nurse who states that physical exam was not completed. Per NP physical exam to be completed [facility]  Davina PokeJoVea Divina Neale, LCSW Therapeutic Triage Specialist [facility] 12/23/2014 5:17 PM

## 2014-12-23 NOTE — ED Notes (Signed)
Pt's saturations dropped into 70's as sleeping, placed on 2 liters of O2.

## 2014-12-23 NOTE — BH Assessment (Signed)
Assessment completed. Consulted with Dahlia ByesJosephine Onuoha, NP who recommends inpatient treatment at this time.  Davina PokeJoVea Alnisa Hasley, LCSW Therapeutic Triage Specialist [facility] 12/23/2014 1:56 PM

## 2014-12-23 NOTE — ED Notes (Signed)
Security reports that they had to be called to Registration, due to the Pt cussing and yelling.   Upon arrival to the room, Pt apologized to this Charge RN for "losing it and being all over the place earlier."  Sts her "blood sugar was high."

## 2014-12-23 NOTE — ED Notes (Signed)
Pt ambulated to the restroom, after a few minutes the pt was found asleep on the toilet. [place] and assisted back to bed.

## 2014-12-23 NOTE — ED Provider Notes (Signed)
Picked up patient from Dr. Radford PaxBeaton that 4 PM. Please see prior notes for history and physical exam. Briefly this is a 49 year old female with a history of bipolar who presents presented initially with chief complaint of vaginal bleeding. Patient was manic and psychotic on evaluation. I was called to reevaluate patient question of vaginal bleeding. Patient is somnolent on my evaluation after she had received Geodon on previous shift. However, patient reports she is no longer having vaginal bleeding, and reports that vaginal bleeding was related to intercourse she had and has not resolved. Patient declines pelvic exam. Feel that if she has continuing concerns once her psychiatric issues or stabilize she may seek outpatient treatment.    Alvira MondayErin Tanaja Ganger, MD 12/24/14 [phone]
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
## 2014-12-23 NOTE — ED Provider Notes (Signed)
CSN: [id number]     Arrival date & time 12/23/14  1028 History   First MD Initiated Contact with Patient 12/23/14 1128     Chief Complaint  Patient presents with  . Abdominal Pain  . Vaginal Bleeding     (Consider location/radiation/quality/duration/timing/severity/associated sxs/prior Treatment) HPI   Patient is a 49 year old female with history of bipolar 1 disorder, anxiety, and depression presenting with acute mania and several complaints including vaginal and rectal bleeding, abdominal pain, "sores" all over, several recent dog bites, and facial pain. Patient is a very poor historian and very rapidly jumps from topic to topic. Patient reports that she first noticed vaginal bleeding this morning after intercourse with her fiance. States that her fiance is an "English professor at [employer] and [place] because Garnet KoyanagiDonald Trump is president." States that she had a hysterectomy several years ago. Patient also reports abdominal pain "all over" for the past 2 days with nausea, vomiting, and diarrhea. No vaginal discharge. Patient reports numerous sores all over her body from her dog biting her. States that she had a sore on her left arm that was "3 inches deep and I packed it myself and it healed in a week." Patient reports that she has a tendency to pick at her skin. She also reports severe left sided facial pain from her anesthesia dolorosa from a facial injury she suffered while chasing her dog across the street. States that her face healed without surgery and that her nose shrunk from the the injury. Endorses using marijuana recently, but no other drugs.   Past Medical History  Diagnosis Date  . Hypertension   . Bipolar 1 disorder (HCC)   . Anxiety   . Back pain   . Bulging disc   . Ruptured intervertebral disc   . Migraine   . Head injury   . Short-term memory loss   . Depression   . Hypothyroidism   . Goiter    Past Surgical History  Procedure Laterality Date  .  Abdominal hysterectomy     Family History  Problem Relation Age of Onset  . Migraines Mother   . Migraines Father   . Other Father   . Migraines Sister    Social History  Substance Use Topics  . Smoking status: Never Smoker   . Smokeless tobacco: Never Used  . Alcohol Use: No   OB History    No data available     Review of Systems  Constitutional: Positive for chills and diaphoresis.  HENT: Negative.   Eyes: Negative.   Respiratory: Negative.   Cardiovascular: Negative.   Gastrointestinal: Positive for nausea, abdominal pain and diarrhea.  Endocrine: Negative.   Genitourinary: Positive for hematuria and vaginal bleeding.  Musculoskeletal: Positive for myalgias.  Skin: Positive for wound.  Allergic/Immunologic: Negative.   Neurological: Positive for numbness.  Hematological: Negative.   Psychiatric/Behavioral: Positive for agitation. The patient is hyperactive.       Allergies  Toradol; Suboxone; and Trazodone and nefazodone  Home Medications   Prior to Admission medications   Medication Sig Start Date End Date Taking? Authorizing Provider  acetaminophen (TYLENOL) 325 MG tablet Take 2 tablets (650 mg total) by mouth every 4 (four) hours as needed for mild pain. Patient not taking: Reported on 09/19/2014 12/01/13   Ashok NorrisEmina Riebock, NP  cyclobenzaprine (FLEXERIL) 10 MG tablet Take 10 mg by mouth 3 (three) times daily as needed for muscle spasms.    Historical Provider, MD  DULoxetine (CYMBALTA) 60 MG capsule Take 60 mg by mouth daily.    Historical Provider, MD  gabapentin (NEURONTIN) 600 MG tablet Take 600 mg by mouth 3 (three) times daily.  09/25/13   Beau Fanny, FNP  HYDROcodone-acetaminophen (NORCO/VICODIN) 5-325 MG per tablet Take 1 tablet by mouth every 4 (four) hours as needed. Patient not taking: Reported on 04/07/2014 12/13/13   Garlon Hatchet, PA-C  ibuprofen (ADVIL,MOTRIN) 800 MG tablet Take 800 mg by mouth every 8 (eight) hours as needed for moderate pain.     Historical Provider, MD  lamoTRIgine (LAMICTAL) 200 MG tablet Take 200 mg by mouth daily.    Historical Provider, MD  levothyroxine (SYNTHROID, LEVOTHROID) 50 MCG tablet Take 50 mcg by mouth daily.    Historical Provider, MD  mupirocin ointment (BACTROBAN) 2 % Apply 1 application topically 3 (three) times daily. For right knee    Historical Provider, MD  oxyCODONE (OXY IR/ROXICODONE) 5 MG immediate release tablet Take 1 tablet (5 mg total) by mouth every 4 (four) hours as needed for moderate pain. Patient not taking: Reported on 09/19/2014 12/01/13   Ashok Norris, NP  oxyCODONE-acetaminophen (PERCOCET/ROXICET) 5-325 MG per tablet Take 1-2 tablets by mouth every 4 (four) hours as needed for severe pain. Patient not taking: Reported on 09/19/2014 09/05/14   Elpidio Anis, PA-C  pravastatin (PRAVACHOL) 40 MG tablet Take 40 mg by mouth daily.    Historical Provider, MD   BP 134/85 mmHg  Pulse 96  Temp(Src) 98.8 F (37.1 C) (Oral)  Resp 18  SpO2 95% Physical Exam  Constitutional: She is oriented to person, place, and time. She appears well-developed and well-nourished.  HENT:  Head: Normocephalic and atraumatic.  Eyes: EOM are normal. Pupils are equal, round, and reactive to light.  Neck: Normal range of motion. Neck supple.  Cardiovascular: Normal rate and regular rhythm.   No murmur heard. Pulmonary/Chest: Effort normal. No respiratory distress.  Abdominal: Soft. Bowel sounds are normal. She exhibits no distension and no mass. There is tenderness. There is no rebound and no guarding.  Musculoskeletal: Normal range of motion. She exhibits no edema or tenderness.  Neurological: She is alert and oriented to person, place, and time.  Skin: Skin is warm and dry. Abrasion (Several small abrasions noted on arms and legs.) noted.  Psychiatric: Her speech is rapid and/or pressured and tangential. She is hyperactive. She expresses impulsivity.  Nursing note and vitals reviewed.   ED Course   Procedures (including critical care time) Labs Review Labs Reviewed  COMPREHENSIVE METABOLIC PANEL - Abnormal; Notable for the following:    Glucose, Bld 102 (*)    AST 69 (*)    ALT 83 (*)    All other components within normal limits  URINALYSIS, ROUTINE W REFLEX MICROSCOPIC (NOT AT [facility]) - Abnormal; Notable for the following:    Specific Gravity, Urine 1.003 (*)    All other components within normal limits  TSH - Abnormal; Notable for the following:    TSH 4.836 (*)    All other components within normal limits  URINE RAPID DRUG SCREEN, HOSP PERFORMED - Abnormal; Notable for the following:    Barbiturates POSITIVE (*)    All other components within normal limits  ACETAMINOPHEN LEVEL - Abnormal; Notable for the following:    Acetaminophen (Tylenol), Serum <10 (*)    All other components within normal limits  LIPASE, BLOOD  CBC  ETHANOL  SALICYLATE LEVEL   MDM   Final diagnoses:  Mania (HCC)  12:24 PM Patient is a 49yo female with history of bipolar 1, anxiety, and depression presenting with likely manic episode and multiple somatic complaints including abdominal pain, vaginal bleeding, rectal bleeding, recent dog bite, and facial pain. Patient is notably hyperactive with pressured and tangential speech. CBC and CMP unremarkable aside from mild elevation in her transaminases. Will check TSH, UDS, ethanol, acetaminophen, and salicylate level. Will give geodon for agitation. TTS consulted.  3:23 PM Patient continues to be severe manic. UDS only positive for barbiturates, otherwise negative labwork. TTS involved and recommending inpatient treatment. IVC paperwork filed. Currently waiting on placement with [facility].   Ardith Dark, MD 12/23/14 1526  Nelva Nay, MD 12/23/14 [phone]

## 2014-12-23 NOTE — ED Notes (Addendum)
Upon walking in to assess the patient, this RN noted a shoestring tied around pt's neck; it was not tight but was looped around pt's neck x 2;no red marks noted to neck where the string was; this RN removed the string and pt's belongings from room; pt was given Geodon earlier in the shift and was reported to this RN that pt has been sleeping since; pt remains resting and arouses to verbal stimuli

## 2014-12-23 NOTE — ED Notes (Signed)
During Triage, Pt exhibiting bizarre and manic behaviors.  Pt having flight of thoughts.  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 Sts her 47 year old daughter is in boarding school in [place] and that this Consulting civil engineerCharge RN and other staff members need to "go back to school and get more education."  Pt upset about having to go to the lobby and continually repeats that we "are not helping her."

## 2014-12-24 ENCOUNTER — Ambulatory Visit: Payer: Self-pay

## 2014-12-24 DIAGNOSIS — F310 Bipolar disorder, current episode hypomanic: Secondary | ICD-10-CM

## 2014-12-24 MED ORDER — DULOXETINE HCL 60 MG PO CPEP
60.0000 mg | ORAL_CAPSULE | Freq: Every day | ORAL | Status: DC
  Administered 2014-12-24: 60 mg via ORAL
  Filled 2014-12-24 (×2): qty 1

## 2014-12-24 MED ORDER — GABAPENTIN 400 MG PO CAPS
400.0000 mg | ORAL_CAPSULE | Freq: Three times a day (TID) | ORAL | Status: DC
  Administered 2014-12-24: 400 mg via ORAL
  Filled 2014-12-24: qty 1

## 2014-12-24 MED ORDER — CLONAZEPAM 0.5 MG PO TABS
0.5000 mg | ORAL_TABLET | Freq: Two times a day (BID) | ORAL | Status: DC | PRN
  Administered 2014-12-24: 0.5 mg via ORAL
  Filled 2014-12-24: qty 1

## 2014-12-24 MED ORDER — LEVOTHYROXINE SODIUM 50 MCG PO TABS
50.0000 ug | ORAL_TABLET | Freq: Every day | ORAL | Status: DC
  Administered 2014-12-24: 50 ug via ORAL
  Filled 2014-12-24 (×3): qty 1

## 2014-12-24 MED ORDER — ACETAMINOPHEN 325 MG PO TABS
650.0000 mg | ORAL_TABLET | Freq: Four times a day (QID) | ORAL | Status: DC | PRN
  Filled 2014-12-24: qty 2

## 2014-12-24 MED ORDER — CYANOCOBALAMIN 250 MCG PO TABS
250.0000 ug | ORAL_TABLET | ORAL | Status: DC
  Administered 2014-12-24: 250 ug via ORAL
  Filled 2014-12-24: qty 1

## 2014-12-24 MED ORDER — TETANUS-DIPHTH-ACELL PERTUSSIS 5-2.5-18.5 LF-MCG/0.5 IM SUSP
0.5000 mL | Freq: Once | INTRAMUSCULAR | Status: AC
  Administered 2014-12-24: 0.5 mL via INTRAMUSCULAR
  Filled 2014-12-24: qty 0.5

## 2014-12-24 MED ORDER — BUTALBITAL-APAP-CAFFEINE 50-325-40 MG PO TABS
2.0000 | ORAL_TABLET | Freq: Once | ORAL | Status: AC
  Administered 2014-12-24: 2 via ORAL
  Filled 2014-12-24: qty 2

## 2014-12-24 MED ORDER — ADULT MULTIVITAMIN W/MINERALS CH
1.0000 | ORAL_TABLET | ORAL | Status: DC
  Administered 2014-12-24: 1 via ORAL
  Filled 2014-12-24: qty 1

## 2014-12-24 MED ORDER — HYDROCHLOROTHIAZIDE 25 MG PO TABS
25.0000 mg | ORAL_TABLET | Freq: Every day | ORAL | Status: DC
Start: 2014-12-24 — End: 2014-12-24
  Filled 2014-12-24 (×2): qty 1

## 2014-12-24 MED ORDER — LISINOPRIL 10 MG PO TABS
10.0000 mg | ORAL_TABLET | Freq: Every day | ORAL | Status: DC
  Administered 2014-12-24: 10 mg via ORAL
  Filled 2014-12-24 (×2): qty 1

## 2014-12-24 MED ORDER — CYANOCOBALAMIN 1000 MCG/ML PO LIQD: 1.0000 [drp] | ORAL | Status: DC

## 2014-12-24 MED ORDER — BACLOFEN 10 MG PO TABS
10.0000 mg | ORAL_TABLET | Freq: Three times a day (TID) | ORAL | Status: DC | PRN
  Filled 2014-12-24: qty 1

## 2014-12-24 MED ORDER — STERILE WATER FOR INJECTION IJ SOLN
INTRAMUSCULAR | Status: AC
  Administered 2014-12-24: 1.2 mL
  Filled 2014-12-24: qty 10

## 2014-12-24 NOTE — ED Notes (Signed)
Pt states that she cannot take Tylenol that it makes her Neuralgia pain to her jaw worse; pt states "I want another one of those shots that I had earlier"

## 2014-12-24 NOTE — ED Notes (Signed)
Psych team reports that they are clearing the Pt and sts they will put a note in the chart.  Pt has asked that she be evaluated for her abdominal pain and "dog bites."  Madilyn Hookees MD made aware of both.

## 2014-12-24 NOTE — ED Notes (Signed)
Pt concerned that this RN had already administered her klonopin, due to the shape and color being similar to a previously administered medication.  This RN went over all previously administered medications and reassured her that she had not been given klonopin previously.

## 2014-12-24 NOTE — ED Notes (Addendum)
Verbalized understanding discharge instructions and follow-up. In no acute distress.  Pt given a bus pass.    

## 2014-12-24 NOTE — Progress Notes (Signed)
   12/24/14 1200  Clinical Encounter Type  Visited With Patient  Visit Type Follow-up;Psychological support;Spiritual support;ED  Spiritual Encounters  Spiritual Needs Emotional;Other (Comment);Prayer Veterinary surgeon(Pastoral Conversation)  Stress Factors  Patient Stress Factors Health changes;Other (Comment);Family relationships   The Chaplain followed up with the patient from a visit the previous day in the Emergency Department. The previous day, the patient had been uncooperative with staff and was extremely manic. The patient was unable to speak with the Chaplain during that visit due to her hyperactivity.  Upon arrival, the patient was sitting up in the bed and appeared calmer than she had been the previous day. The patient welcomed the Chaplain in and stated that she remembered her from the previous day. The patient stated that she wasn't doing well today and that she still upset that staff made her an IVC over her behavior the previous day. The patient told the Chaplain that she felt like staff had been ignoring her the previous day, but the Chaplain did not witness this occuring during her time in the ED the previous day. The patient continued to tell the Chaplain that her step-mother was an Warehouse manager"administrator" at [employer] and that people were going to be fired after she makes her complaint on the patient satisfaction survey.  The patient continually complained of stomach pains; and stated that she told staff everything that was going on with her yesterday, but that they did not believe her. The patient was fixated on the wounds on her arms and stated that they were not from razor blade wounds. The patient was worried that staff thought she was cutting herself.  The Chaplain did not witness any family or friends visiting the patient during either visit. The patient did state that her relationship with her biological mother has been strained and that her biological mother does not speak to her. There does not appear to  be any social or emotional support.  The patient requested that the Chaplain pray with her, which the Chaplain did.  Chaplain interventions included pastoral conversation, spiritual assessment, emotional/psychological support and interdisciplinary work with medical staff.  The Chaplain will follow-up with the patient upon request.

## 2014-12-24 NOTE — ED Provider Notes (Signed)
Patient visit shared. Patient arrived to the emergency department yesterday for abdominal pain, noted to be manic and psychiatry was consult. Psychiatry rescinded her IVC. Patient declines a workup for her abdominal pain, appears to be manic on examination. Disposition per psychiatry. Recommend outpatient follow-up for her abdominal pain that is not currently active on evaluation.  Tilden FossaElizabeth Arlen Legendre, MD 12/24/14 [phone]

## 2014-12-24 NOTE — BH Assessment (Signed)
[facility] Assessment Progress Note  Per Thedore MinsMojeed Akintayo, MD, this pt does not require psychiatric hospitalization at this time.  Pt presents under IVC, which Dr Jannifer FranklinAkintayo has rescinded.  [facility], where she received counseling services.  She is to be advised to request psychiatry as well.  Pt's discharge instructions advise her to do so.  Pt's nurse has been notified.  Doylene Canninghomas Shereece Wellborn, MA Triage Specialist [phone]

## 2014-12-24 NOTE — Consult Note (Signed)
[facility] Consult   Reason for Consult:  Anxiety, agitation Referring Physician:  EDP Patient Identification: Brittany [name] MRN:  [id number] Principal Diagnosis: Bipolar affective [facility]) Diagnosis:   Patient Active Problem List   Diagnosis Date Noted  . Bipolar affective ([place]) [F31.9] 01/18/2012    Priority: Low  . Orbit fracture ([place]) [S02.80XA] 11/30/2013  . Polypharmacy [Z79.899] 01/18/2012    Class: Chronic  . Borderline personality disorder [F60.3] 01/18/2012  . Anxiety disorder [F41.9] 01/14/2012  . Pyelonephritis [N12] 09/16/2011  . Hypothyroidism [E03.9] 09/16/2011  . Anxiety [F41.9] 09/16/2011    Total Time spent with patient: 45 minutes  Subjective:   Brittany [name] is a 49 y.o. female patient admitted with Anxiety, agitation.  HPI: Caucasian female with a hx of Bipolar disorder was seen and evaluated for anxiety.  Patient was IVC by the EDP for hypomanic symptoms yesterday.  Patient came to the ER yesterday for vaginal and rectal bleeding after sexual intercourse.  Patient was referred to Psychiatry for having flight of ideas and anxiety symptoms.  Today she was seen calm, cooperative and logical.  Patient reported that she has been compliant with her medications and did not need to see a Psychiatrist.  She reports good sleep and appetite.  She sees her counselor at [facility] and her PMD prescribes her medications for Bipolar.  Patient denies SI/HI/AVH.  Patient was able to name all of her medications and frequency of use.  Patient is cleared by Psychiatry at this time.  Past Psychiatric History:  Bipolar disorder, Anxiety disorder  Risk to Self: Suicidal Ideation: No Suicidal Intent: No Is patient at risk for suicide?: No Suicidal Plan?: No Access to Means: No What has been your use of drugs/alcohol within the last 12 months?: Denies How many times?: 1 (1998) Other Self Harm Risks: Has cut in the past last time in 2009 Triggers for Past Attempts:  Other (Comment) ("tired of living") Intentional Self Injurious Behavior: Cutting Comment - Self Injurious Behavior: last cut in 2009 Risk to Others: Homicidal Ideation: No Thoughts of Harm to Others: No Current Homicidal Intent: No Current Homicidal Plan: No Access to Homicidal Means: No Identified Victim: Denies History of harm to others?: No Assessment of Violence: None Noted Violent Behavior Description: Denies Does patient have access to weapons?: No Criminal Charges Pending?: No Does patient have a court date: No Prior Inpatient Therapy: Prior Inpatient Therapy: Yes Prior Therapy Dates: 2014 Prior Therapy Facilty/Provider(s): [facility] Reason for Treatment: Bipolar Prior Outpatient Therapy: Prior Outpatient Therapy: Yes Prior Therapy Dates: 4 years ago Prior Therapy Facilty/Provider(s): Dr. Wonda Amis Reason for Treatment: Bipolar Does patient have an ACCT team?: No Does patient have Intensive In-House Services?  : No Does patient have [facility] services? : No Does patient have [facility] services?: No  Past Medical History:  Past Medical History  Diagnosis Date  . Hypertension   . Bipolar 1 disorder ([place])   . Anxiety   . Back pain   . Bulging disc   . Ruptured intervertebral disc   . Migraine   . Head injury   . Short-term memory loss   . Depression   . Hypothyroidism   . Goiter     Past Surgical History  Procedure Laterality Date  . Abdominal hysterectomy     Family History:  Family History  Problem Relation Age of Onset  . Migraines Mother   . Migraines Father   . Other Father   . Migraines Sister    Family Psychiatric  History: Sister  has a hx of Bipolar disorder Social History:  History  Alcohol Use No     History  Drug Use  . Yes  . Special: Marijuana    Social History   Social History  . Marital Status: Legally Separated    Spouse Name: N/A  . Number of Children: N/A  . Years of Education: N/A   Social History Main Topics  . Smoking status: Never  Smoker   . Smokeless tobacco: Never Used  . Alcohol Use: No  . Drug Use: Yes    Special: Marijuana  . Sexual Activity: Yes    Birth Control/ Protection: Condom   Other Topics Concern  . None   Social History Narrative   Additional Social History:    Pain Medications: See PTA Prescriptions: See PTA Over the Counter: See PTA History of alcohol / drug use?: No history of alcohol / drug abuse                     Allergies:   Allergies  Allergen Reactions  . Toradol [Ketorolac Tromethamine] Swelling  . Suboxone [Buprenorphine Hcl-Naloxone Hcl]     cellulitis   . Trazodone And Nefazodone Other (See Comments)    Bladder infection    Labs:  Results for orders placed or performed during the hospital encounter of 12/23/14 (from the past 48 hour(s))  Urinalysis, Routine w reflex microscopic (not at [facility])     Status: Abnormal   Collection Time: 12/23/14 10:48 AM  Result Value Ref Range   Color, Urine YELLOW YELLOW   APPearance CLEAR CLEAR   Specific Gravity, Urine 1.003 (L) 1.005 - 1.030   pH 6.5 5.0 - 8.0   Glucose, UA NEGATIVE NEGATIVE mg/dL   Hgb urine dipstick NEGATIVE NEGATIVE   Bilirubin Urine NEGATIVE NEGATIVE   Ketones, ur NEGATIVE NEGATIVE mg/dL   Protein, ur NEGATIVE NEGATIVE mg/dL   Urobilinogen, UA 0.2 0.0 - 1.0 mg/dL   Nitrite NEGATIVE NEGATIVE   Leukocytes, UA NEGATIVE NEGATIVE    Comment: MICROSCOPIC NOT DONE ON URINES WITH NEGATIVE PROTEIN, BLOOD, LEUKOCYTES, NITRITE, OR GLUCOSE <1000 mg/dL.  Urine rapid drug screen (hosp performed)     Status: Abnormal   Collection Time: 12/23/14 10:48 AM  Result Value Ref Range   Opiates NONE DETECTED NONE DETECTED   Cocaine NONE DETECTED NONE DETECTED   Benzodiazepines NONE DETECTED NONE DETECTED   Amphetamines NONE DETECTED NONE DETECTED   Tetrahydrocannabinol NONE DETECTED NONE DETECTED   Barbiturates POSITIVE (A) NONE DETECTED    Comment:        DRUG SCREEN FOR MEDICAL PURPOSES ONLY.  IF CONFIRMATION IS  NEEDED FOR ANY PURPOSE, NOTIFY LAB WITHIN 5 DAYS.        LOWEST DETECTABLE LIMITS FOR URINE DRUG SCREEN Drug Class       Cutoff (ng/mL) Amphetamine      1000 Barbiturate      200 Benzodiazepine   333 Tricyclics       545 Opiates          300 Cocaine          300 THC              50   Lipase, blood     Status: None   Collection Time: 12/23/14 11:11 AM  Result Value Ref Range   Lipase 36 11 - 51 U/L  Comprehensive metabolic panel     Status: Abnormal   Collection Time: 12/23/14 11:11 AM  Result Value Ref Range  Sodium 139 135 - 145 mmol/L   Potassium 3.9 3.5 - 5.1 mmol/L   Chloride 104 101 - 111 mmol/L   CO2 24 22 - 32 mmol/L   Glucose, Bld 102 (H) 65 - 99 mg/dL   BUN 15 6 - 20 mg/dL   Creatinine, Ser 0.69 0.44 - 1.00 mg/dL   Calcium 9.3 8.9 - 10.3 mg/dL   Total Protein 8.0 6.5 - 8.1 g/dL   Albumin 4.6 3.5 - 5.0 g/dL   AST 69 (H) 15 - 41 U/L   ALT 83 (H) 14 - 54 U/L   Alkaline Phosphatase 89 38 - 126 U/L   Total Bilirubin 0.7 0.3 - 1.2 mg/dL   GFR calc non Af Amer >60 >60 mL/min   GFR calc Af Amer >60 >60 mL/min    Comment: (NOTE) The eGFR has been calculated using the CKD EPI equation. This calculation has not been validated in all clinical situations. eGFR's persistently <60 mL/min signify possible Chronic Kidney Disease.    Anion gap 11 5 - 15  CBC     Status: None   Collection Time: 12/23/14 11:11 AM  Result Value Ref Range   WBC 7.3 4.0 - 10.5 K/uL   RBC 4.12 3.87 - 5.11 MIL/uL   Hemoglobin 12.5 12.0 - 15.0 g/dL   HCT 38.6 36.0 - 46.0 %   MCV 93.7 78.0 - 100.0 fL   MCH 30.3 26.0 - 34.0 pg   MCHC 32.4 30.0 - 36.0 g/dL   RDW 14.9 11.5 - 15.5 %   Platelets 260 150 - 400 K/uL  TSH     Status: Abnormal   Collection Time: 12/23/14 11:11 AM  Result Value Ref Range   TSH 4.836 (H) 0.350 - 4.500 uIU/mL  Ethanol     Status: None   Collection Time: 12/23/14 12:26 PM  Result Value Ref Range   Alcohol, Ethyl (B) <5 <5 mg/dL    Comment:        LOWEST DETECTABLE  LIMIT FOR SERUM ALCOHOL IS 5 mg/dL FOR MEDICAL PURPOSES ONLY   Salicylate level     Status: None   Collection Time: 12/23/14 12:26 PM  Result Value Ref Range   Salicylate Lvl <0.7 2.8 - 30.0 mg/dL  Acetaminophen level     Status: Abnormal   Collection Time: 12/23/14 12:26 PM  Result Value Ref Range   Acetaminophen (Tylenol), Serum <10 (L) 10 - 30 ug/mL    Comment:        THERAPEUTIC CONCENTRATIONS VARY SIGNIFICANTLY. A RANGE OF 10-30 ug/mL MAY BE AN EFFECTIVE CONCENTRATION FOR MANY PATIENTS. HOWEVER, SOME ARE BEST TREATED AT CONCENTRATIONS OUTSIDE THIS RANGE. ACETAMINOPHEN CONCENTRATIONS >150 ug/mL AT 4 HOURS AFTER INGESTION AND >50 ug/mL AT 12 HOURS AFTER INGESTION ARE OFTEN ASSOCIATED WITH TOXIC REACTIONS.     Current Facility-Administered Medications  Medication Dose Route Frequency Provider Last Rate Last Dose  . acetaminophen (TYLENOL) tablet 650 mg  650 mg Oral Q6H PRN Kristen N Ward, DO   650 mg at 12/24/14 0041  . alum & mag hydroxide-simeth (MAALOX/MYLANTA) 200-200-20 MG/5ML suspension 30 mL  30 mL Oral PRN Leonard Schwartz, MD      . baclofen (LIORESAL) tablet 10 mg  10 mg Oral TID PRN Quintella Reichert, MD      . clonazePAM Bobbye [place]) tablet 0.5 mg  0.5 mg Oral BID PRN Quintella Reichert, MD   0.5 mg at 12/24/14 1152  . DULoxetine (CYMBALTA) DR capsule 60 mg  60 mg Oral Daily Benjamine Mola  Ralene Bathe, MD   60 mg at 12/24/14 1133  . gabapentin (NEURONTIN) capsule 400 mg  400 mg Oral TID Quintella Reichert, MD   400 mg at 12/24/14 1052  . hydrochlorothiazide (HYDRODIURIL) tablet 25 mg  25 mg Oral Daily Quintella Reichert, MD   25 mg at 12/24/14 1133  . levothyroxine (SYNTHROID, LEVOTHROID) tablet 50 mcg  50 mcg Oral QAC breakfast Quintella Reichert, MD   50 mcg at 12/24/14 1133  . lisinopril (PRINIVIL,ZESTRIL) tablet 10 mg  10 mg Oral Daily Quintella Reichert, MD   10 mg at 12/24/14 1132  . multivitamin with minerals tablet 1 tablet  1 tablet Oral QODAY Quintella Reichert, MD   1 tablet at 12/24/14 1051  .  ondansetron (ZOFRAN) tablet 4 mg  4 mg Oral Q8H PRN Leonard Schwartz, MD      . vitamin B-12 (CYANOCOBALAMIN) tablet 250 mcg  250 mcg Oral Beverly Gust Quintella Reichert, MD   250 mcg at 12/24/14 1052  . ziprasidone (GEODON) injection 10 mg  10 mg Intramuscular Q6H PRN Leonard Schwartz, MD   10 mg at 12/24/14 0132  . zolpidem (AMBIEN) tablet 5 mg  5 mg Oral QHS PRN Leonard Schwartz, MD       Current Outpatient Prescriptions  Medication Sig Dispense Refill  . baclofen (LIORESAL) 10 MG tablet Take 1 tablet by mouth 3 (three) times daily as needed for muscle spasms.   5  . butalbital-acetaminophen-caffeine (FIORICET, ESGIC) 50-325-40 MG tablet Take 1 tablet by mouth every 4 (four) hours as needed. Headache/pain  2  . Cholecalciferol (VITAMIN D PO) Take 1 tablet by mouth every other day.    . clonazePAM (KLONOPIN) 0.5 MG tablet Take 0.5 mg by mouth daily as needed for anxiety.   2  . Cyanocobalamin 1000 MCG/ML LIQD Take 1 drop by mouth every other day.    . cyclobenzaprine (FLEXERIL) 10 MG tablet Take 10 mg by mouth 3 (three) times daily as needed for muscle spasms.    . DULoxetine (CYMBALTA) 60 MG capsule Take 60 mg by mouth daily.    Marland Kitchen gabapentin (NEURONTIN) 400 MG capsule Take 800 capsules by mouth 3 (three) times daily.   5  . hydrochlorothiazide (HYDRODIURIL) 25 MG tablet Take 1 tablet by mouth daily.  5  . levothyroxine (SYNTHROID, LEVOTHROID) 50 MCG tablet Take 50 mcg by mouth daily.    Marland Kitchen lisinopril (PRINIVIL,ZESTRIL) 10 MG tablet Take 1 tablet by mouth daily.  5  . Multiple Vitamin (MULTIVITAMIN WITH MINERALS) TABS tablet Take 1 tablet by mouth every other day.    . mupirocin ointment (BACTROBAN) 2 % Apply 1 application topically 3 (three) times daily as needed (for infection). For right knee      Musculoskeletal: Strength & Muscle Tone: within normal limits Gait & Station: normal Patient leans: N/A  Psychiatric Specialty Exam: Review of Systems  Constitutional: Negative.   HENT: Negative.   Eyes:  Negative.   Respiratory: Negative.   Cardiovascular: Negative.   Gastrointestinal:       Hx of gall bladder disease  Genitourinary: Negative.   Musculoskeletal: Negative.   Skin: Negative.   Neurological:       Hx of Migraine headache  Endo/Heme/Allergies:       Hx of Hypothyroidism    Blood pressure 145/77, pulse 83, temperature 97.9 F (36.6 C), temperature source Oral, resp. rate 15, SpO2 98 %.There is no weight on file to calculate BMI.  General Appearance: Casual and Fairly Groomed  Eye Contact::  Good  Speech:  Clear and Coherent and Normal Rate  Volume:  Normal  Mood:  Anxious  Affect:  Congruent  Thought Process:  Coherent, Goal Directed and Intact  Orientation:  Full (Time, Place, and Person)  Thought Content:  WDL  Suicidal Thoughts:  No  Homicidal Thoughts:  No  Memory:  Immediate;   Good Recent;   Good Remote;   Good  Judgement:  Good  Insight:  Good  Psychomotor Activity:  Normal  Concentration:  Good  Recall:  NA  Fund of Knowledge:Good  Language: Good  Akathisia:  NA  Handed:  Right  AIMS (if indicated):     Assets:  Desire for Improvement  ADL's:  Intact  Cognition: WNL  Sleep:       Disposition: Cleared by Psychiatry, will follow up with her providers at [facility]   PMHNP-BC 12/24/2014 12:26 PM Patient seen face-to-face for psychiatric evaluation, chart reviewed and case discussed with the physician extender and developed treatment plan. Reviewed the information documented and agree with the treatment plan. Corena Pilgrim, MD

## 2014-12-24 NOTE — Discharge Instructions (Addendum)
For your ongoing mental health needs, you are advised to continue treatment with [facility].  You have indicated that you currently see a therapist there.  [facility]:       [facility]      [address]      [place], [place] [id number]      [phone] Bipolar Disorder Bipolar disorder is a mental illness. The term bipolar disorder actually is used to describe a group of disorders that all share varying degrees of emotional highs and lows that can interfere with daily functioning, such as work, school, or relationships. Bipolar disorder also can lead to drug abuse, hospitalization, and suicide. The emotional highs of bipolar disorder are periods of elation or irritability and high energy. These highs can range from a mild form (hypomania) to a severe form (mania). People experiencing episodes of hypomania may appear energetic, excitable, and highly productive. People experiencing mania may behave impulsively or erratically. They often make poor decisions. They may have difficulty sleeping. The most severe episodes of mania can involve having very distorted beliefs or perceptions about the world and seeing or hearing things that are not real (psychotic delusions and hallucinations).  The emotional lows of bipolar disorder (depression) also can range from mild to severe. Severe episodes of bipolar depression can involve psychotic delusions and hallucinations. Sometimes people with bipolar disorder experience a state of mixed mood. Symptoms of hypomania or mania and depression are both present during this mixed-mood episode. SIGNS AND SYMPTOMS There are signs and symptoms of the episodes of hypomania and mania as well as the episodes of depression. The signs and symptoms of hypomania and mania are similar but vary in severity. They include:  Inflated self-esteem or feeling of increased self-confidence.  Decreased need for  sleep.  Unusual talkativeness (rapid or pressured speech) or the feeling of a need to keep talking.  Sensation of racing thoughts or constant talking, with quick shifts between topics that may or may not be related (flight of ideas).  Decreased ability to focus or concentrate.  Increased purposeful activity, such as work, studies, or social activity, or nonproductive activity, such as pacing, squirming and fidgeting, or finger and toe tapping.  Impulsive behavior and use of poor judgment, resulting in high-risk activities, such as having unprotected sex or spending excessive amounts of money. Signs and symptoms of depression include the following:   Feelings of sadness, hopelessness, or helplessness.  Frequent or uncontrollable episodes of crying.  Lack of feeling anything or caring about anything.  Difficulty sleeping or sleeping too much.  Inability to enjoy the things you used to enjoy.   Desire to be alone all the time.   Feelings of guilt or worthlessness.  Lack of energy or motivation.   Difficulty concentrating, remembering, or making decisions.  Change in appetite or weight beyond normal fluctuations.  Thoughts of death or the desire to harm yourself. DIAGNOSIS  Bipolar disorder is diagnosed through an assessment by your caregiver. Your caregiver will ask questions about your emotional episodes. There are two main types of bipolar disorder. People with type I bipolar disorder have manic episodes with or without depressive episodes. People with type II bipolar disorder have hypomanic episodes and major depressive episodes, which are more serious than mild depression. The type of bipolar disorder you have can make an important difference in how your illness is monitored and treated. Your caregiver may ask questions about your medical history and  use of alcohol or drugs, including prescription medication. Certain medical conditions and substances also can cause  emotional highs and lows that resemble bipolar disorder (secondary bipolar disorder).  TREATMENT  Bipolar disorder is a long-term illness. It is best controlled with continuous treatment rather than treatment only when symptoms occur. The following treatments can be prescribed for bipolar disorders:  Medication--Medication can be prescribed by a doctor that is an expert in treating mental disorders (psychiatrists). Medications called mood stabilizers are usually prescribed to help control the illness. Other medications are sometimes added if symptoms of mania, depression, or psychotic delusions and hallucinations occur despite the use of a mood stabilizer.  Talk therapy--Some forms of talk therapy are helpful in providing support, education, and guidance. A combination of medication and talk therapy is best for managing the disorder over time. A procedure in which electricity is applied to your brain through your scalp (electroconvulsive therapy) is used in cases of severe mania when medication and talk therapy do not work or work too slowly.   This information is not intended to replace advice given to you by your health care provider. Make sure you discuss any questions you have with your health care provider.   Document Released: 05/08/2000 Document Revised: 02/20/2014 Document Reviewed: 02/26/2012 Elsevier Interactive Patient Education 2016 Else[employer] Inc[employer] Abdominal Pain, Adult Many things can cause abdominal pain. Usually, abdominal pain is not caused by a disease and will improve without treatment. It can often be observed and treated at home. Your health care provider will do a physical exam and possibly order blood tests and X-rays to help determine the seriousness of your pain. However, in many cases, more time must pass before a clear cause of the pain can be found. Before that point, your health care provider may not know if you need more testing or further treatment. HOME CARE  INSTRUCTIONS Monitor your abdominal pain for any changes. The following actions may help to alleviate any discomfort you are experiencing:  Only take over-the-counter or prescription medicines as directed by your health care provider.  Do not take laxatives unless directed to do so by your health care provider.  Try a clear liquid diet (broth, tea, or water) as directed by your health care provider. Slowly move to a bland diet as tolerated. SEEK MEDICAL CARE IF:  You have unexplained abdominal pain.  You have abdominal pain associated with nausea or diarrhea.  You have pain when you urinate or have a bowel movement.  You experience abdominal pain that wakes you in the night.  You have abdominal pain that is worsened or improved by eating food.  You have abdominal pain that is worsened with eating fatty foods.  You have a fever. SEEK IMMEDIATE MEDICAL CARE IF:  Your pain does not go away within 2 hours.  You keep throwing up (vomiting).  Your pain is felt only in portions of the abdomen, such as the right side or the left lower portion of the abdomen.  You pass bloody or black tarry stools. MAKE SURE YOU:  Understand these instructions.  Will watch your condition.  Will get help right away if you are not doing well or get worse.   This information is not intended to replace advice given to you by your health care provider. Make sure you discuss any questions you have with your health care provider.   Document Released: 11/09/2004 Document Revised: 10/21/2014 Document Reviewed: 10/09/2012 Elsevier Interactive Patient Education [employer]

## 2014-12-24 NOTE — ED Notes (Signed)
Pt anxious, stating "I am seeing people faces melting"; pt gagging and stating "I am puking"; pt restless and moving all over stretcher; pt states "I need a shot, I need a shot"; pt constantly hollering out and states "I can't take it, there are too many people around"; pt given prn Geodon, sitter at bedside

## 2014-12-24 NOTE — ED Notes (Signed)
MD at bedside. 

## 2014-12-24 NOTE — ED Notes (Signed)
Pt asking for a tetanus shot, due to potential dog bites on R wrist.

## 2015-01-05 ENCOUNTER — Encounter: Payer: Self-pay | Admitting: Neurology

## 2015-01-05 ENCOUNTER — Ambulatory Visit (INDEPENDENT_AMBULATORY_CARE_PROVIDER_SITE_OTHER): Payer: Medicaid Other | Admitting: Neurology

## 2015-01-05 VITALS — BP 126/83 | HR 90 | Ht 64.0 in | Wt 230.0 lb

## 2015-01-05 DIAGNOSIS — R51 Headache: Secondary | ICD-10-CM

## 2015-01-05 DIAGNOSIS — R519 Headache, unspecified: Secondary | ICD-10-CM | POA: Insufficient documentation

## 2015-01-05 MED ORDER — GABAPENTIN 400 MG PO CAPS: 1200.0000 mg | ORAL_CAPSULE | Freq: Three times a day (TID) | ORAL | Status: DC

## 2015-01-05 MED ORDER — NORTRIPTYLINE HCL 25 MG PO CAPS: 25.0000 mg | ORAL_CAPSULE | Freq: Two times a day (BID) | ORAL | Status: DC

## 2015-01-05 NOTE — Progress Notes (Signed)
PATIENT: Brittany [name] DOB: [birth year]  Chief Complaint  Patient presents with  . Facial Pain    She is here with her fiancee, John. She is having pain, numbness and burning on the right side of her face, especially around her lips.  She is having difficulty talking and chewing.  She is currently taking gabapentin , TID.  Reports concern over having multiple head injuries over the course of her life.     HISTORICAL   Brittany [name] is a 49 years old right-handed female, accompanied by her Lorelee Market, seen in refer by  primary care physician Evelene [name], MD in November 20 second 2016 for evaluation of numbness burning and pain on the right side of her face  She had a history of bipolar disorder, also taking zubsolv 8.6-21, apparently was under pain management in the past,  She reported a facial injury in October 2015 with bilateral orbital bruise, with reported right orbit, zygomatic fracture, ever since the event, she had constant numbness, shooting pain at right upper lip, cold heaviness sensation,   Over the past few months, she has seen different specialists, currently taking gabapentin 400 mg 2 tablets 3 times a day without taking care of her problem,   She also has tried oxycodone, Narco clonazepam, which is helpful, those are the prescription she wants, I have reviewed [place] controlled substance reporting system, she is taking clonazepam 0.5 milligrams daily, Ambien 10 mg daily,  She reported a history of severe allergic reaction to Tegretol, weight gain with Lyrica  I have personally reviewed CAT scan in February 2016, no acute intracranial abnormality  REVIEW OF SYSTEMS: Full 14 system review of systems performed and notable only for weight loss, fatigue, murmur, blurry vision, eye pain, shortness of breath, snoring, diarrhea, rash, joint pain, achy muscles, runny nose, skin sensitivity, memory loss, headaches, numbness, insomnia, sleepiness, snoring,  depression anxiety, change in appetite   ALLERGIES: Allergies  Allergen Reactions  . Toradol [Ketorolac Tromethamine] Swelling  . Suboxone [Buprenorphine Hcl-Naloxone Hcl]     cellulitis   . Tegretol [Carbamazepine] Rash  . Trazodone And Nefazodone Other (See Comments)    Bladder infection    HOME MEDICATIONS: Current Outpatient Prescriptions  Medication Sig Dispense Refill  . baclofen (LIORESAL) 10 MG tablet Take 1 tablet by mouth 3 (three) times daily as needed for muscle spasms.   5  . butalbital-acetaminophen-caffeine (FIORICET, ESGIC) 50-325-40 MG tablet Take 1 tablet by mouth every 4 (four) hours as needed. Headache/pain  2  . Cholecalciferol (VITAMIN D PO) Take 1 tablet by mouth every other day.    . clonazePAM (KLONOPIN) 0.5 MG tablet Take 0.5 mg by mouth daily as needed for anxiety.   2  . cyanocobalamin 1000 MCG tablet Take 1,000 mcg by mouth daily.    . DULoxetine (CYMBALTA) 60 MG capsule Take 60 mg by mouth daily.    Marland Kitchen gabapentin (NEURONTIN) 400 MG capsule Take 800 capsules by mouth 3 (three) times daily.   5  . hydrochlorothiazide (HYDRODIURIL) 25 MG tablet Take 1 tablet by mouth daily.  5  . levothyroxine (SYNTHROID, LEVOTHROID) 50 MCG tablet Take 50 mcg by mouth daily.    Marland Kitchen lisinopril (PRINIVIL,ZESTRIL) 10 MG tablet Take 1 tablet by mouth daily.  5  . Multiple Vitamin (MULTIVITAMIN WITH MINERALS) TABS tablet Take 1 tablet by mouth every other day.    . mupirocin ointment (BACTROBAN) 2 % Apply 1 application topically 3 (three) times daily as needed (for infection). For  right knee       PAST MEDICAL HISTORY: Past Medical History  Diagnosis Date  . Hypertension   .  depression    . Anxiety   . Back pain   . Bulging disc   . Ruptured intervertebral disc   . Migraine   . Head injury   . Short-term memory loss   . Depression   . Hypothyroidism   . Goiter   . Facial pain     PAST SURGICAL HISTORY: Past Surgical History  Procedure Laterality Date  .  Abdominal hysterectomy      FAMILY HISTORY: Family History  Problem Relation Age of Onset  . Migraines Mother   . Migraines Father   . Other Father   . Migraines Sister     SOCIAL HISTORY:  Social History   Social History  . Marital Status: Legally Separated    Spouse Name: N/A  . Number of Children: 1  . Years of Education: 16   Occupational History  . Unemployed    Social History Main Topics  . Smoking status: Never Smoker   . Smokeless tobacco: Never Used  . Alcohol Use: No  . Drug Use: Yes    Special: Marijuana  . Sexual Activity: Yes    Birth Control/ Protection: Condom   Other Topics Concern  . Not on file   Social History Narrative   Lives at home with her fiancee.   Right-handed.   No caffeine use.     PHYSICAL EXAM   Filed Vitals:   01/05/15 1114  BP: 126/83  Pulse: 90  Height: 5\' 4"  (1.626 m)  Weight: 230 lb (104.327 kg)    Not recorded      Body mass index is 39.46 kg/(m^2).  PHYSICAL EXAMNIATION:  Gen: NAD, conversant, well nourised, obese, well groomed                     Cardiovascular: Regular rate rhythm, no peripheral edema, warm, nontender. Eyes: Conjunctivae clear without exudates or hemorrhage Neck: Supple, no carotid bruise. Pulmonary: Clear to auscultation bilaterally   NEUROLOGICAL EXAM:  MENTAL STATUS: Speech:    Speech is normal; fluent and spontaneous with normal comprehension.  Cognition:     Orientation to time, place and person     Normal recent and remote memory     Normal Attention span and concentration     Normal Language, naming, repeating,spontaneous speech     Fund of knowledge   CRANIAL NERVES: CN II: Visual fields are full to confrontation. Fundoscopic exam is normal with sharp discs and no vascular changes. Pupils are round equal and briskly reactive to light. CN III, IV, VI: extraocular movement are normal. No ptosis. CN V: Corneal responses are intact. Facial sensation is intact to pinprick in  all 3 divisions bilaterally. Right upper lip area sensitivity to touch CN VII: Face is symmetric with normal eye closure and smile. CN VIII: Hearing is normal to rubbing fingers CN IX, X: Palate elevates symmetrically. Phonation is normal. CN XI: Head turning and shoulder shrug are intact CN XII: Tongue is midline with normal movements and no atrophy.  MOTOR: There is no pronator drift of out-stretched arms. Muscle bulk and tone are normal. Muscle strength is normal.  REFLEXES: Reflexes are 2+ and symmetric at the biceps, triceps, knees, and ankles. Plantar responses are flexor.  SENSORY: Intact to light touch, pinprick, position sense, and vibration sense are intact in fingers and toes.  COORDINATION: Rapid alternating  movements and fine finger movements are intact. There is no dysmetria on finger-to-nose and heel-knee-shin.    GAIT/STANCE: Posture is normal. Gait is steady with normal steps, base, arm swing, and turning. Heel and toe walking are normal. Tandem gait is normal.  Romberg is absent.   DIAGNOSTIC DATA (LABS, IMAGING, TESTING) - I reviewed patient records, labs, notes, testing and imaging myself where available.  ASSESSMENT AND PLAN  Brittany [name] is a 49 y.o. female    Right facial pain  MRI of the brain with and without contrast  Increase gabapentin to 400 mg 3 tablets 3 times a day  Add on nortriptyline 25 mg twice a day  Refer her to pain management   Levert Feinstein, M.D. Ph.D.  [facility] [address] [place], [place] [address] Ph: [phone] Fax: [phone]  [id number] Lacie Scotts, MD

## 2015-01-20 ENCOUNTER — Emergency Department (INDEPENDENT_AMBULATORY_CARE_PROVIDER_SITE_OTHER)
Admission: EM | Admit: 2015-01-20 | Discharge: 2015-01-20 | Disposition: A | Discharge status: Home / Self Care | Payer: Medicaid Other | Source: Home / Self Care | Attending: Family Medicine | Admitting: Family Medicine

## 2015-01-20 ENCOUNTER — Emergency Department (HOSPITAL_COMMUNITY)
Admission: EM | Admit: 2015-01-20 | Discharge: 2015-01-20 | Disposition: A | Discharge status: Home / Self Care | Payer: Medicaid Other

## 2015-01-20 ENCOUNTER — Encounter (HOSPITAL_COMMUNITY): Payer: Self-pay | Admitting: *Deleted

## 2015-01-20 DIAGNOSIS — L739 Follicular disorder, unspecified: Secondary | ICD-10-CM | POA: Diagnosis not present

## 2015-01-20 HISTORY — DX: Injury of facial nerve, unspecified side, initial encounter: S04.50XA

## 2015-01-20 MED ORDER — CEPHALEXIN 500 MG PO CAPS: 500.0000 mg | ORAL_CAPSULE | Freq: Three times a day (TID) | ORAL | Status: DC

## 2015-01-20 NOTE — ED Provider Notes (Signed)
CSN: [id number]     Arrival date & time 01/20/15  1352 History   First MD Initiated Contact with Patient 01/20/15 1409     Chief Complaint  Patient presents with  . Facial Pain   (Consider location/radiation/quality/duration/timing/severity/associated sxs/prior Treatment) Patient is a 49 y.o. female presenting with rash. The history is provided by the patient.  Rash Location:  Leg and shoulder/arm Shoulder/arm rash location:  L forearm and R forearm Leg rash location:  L lower leg and R lower leg Quality: dryness, painful and redness   Pain details:    Quality:  Sore   Severity:  Mild   Onset quality:  Gradual   Progression:  Unchanged Severity:  Moderate Chronicity:  Chronic Associated symptoms: no fever     Past Medical History  Diagnosis Date  . Hypertension   . Bipolar 1 disorder (HCC)   . Anxiety   . Back pain   . Bulging disc   . Ruptured intervertebral disc   . Migraine   . Head injury   . Short-term memory loss   . Depression   . Hypothyroidism   . Goiter   . Facial pain    Past Surgical History  Procedure Laterality Date  . Abdominal hysterectomy     Family History  Problem Relation Age of Onset  . Migraines Mother   . Migraines Father   . Other Father   . Migraines Sister    Social History  Substance Use Topics  . Smoking status: Never Smoker   . Smokeless tobacco: Never Used  . Alcohol Use: No   OB History    No data available     Review of Systems  Constitutional: Negative for fever.  Skin: Positive for rash.  All other systems reviewed and are negative.   Allergies  Toradol; Suboxone; Tegretol; and Trazodone and nefazodone  Home Medications   Prior to Admission medications   Medication Sig Start Date End Date Taking? Authorizing Provider  baclofen (LIORESAL) 10 MG tablet Take 1 tablet by mouth 3 (three) times daily as needed for muscle spasms.  12/18/14   Historical Provider, MD  butalbital-acetaminophen-caffeine (FIORICET, ESGIC)  50-325-40 MG tablet Take 1 tablet by mouth every 4 (four) hours as needed. Headache/pain 12/16/14   Historical Provider, MD  Cholecalciferol (VITAMIN D PO) Take 1 tablet by mouth every other day.    Historical Provider, MD  clonazePAM (KLONOPIN) 0.5 MG tablet Take 0.5 mg by mouth daily as needed for anxiety.  12/10/14   Historical Provider, MD  cyanocobalamin 1000 MCG tablet Take 1,000 mcg by mouth daily.    Historical Provider, MD  DULoxetine (CYMBALTA) 60 MG capsule Take 60 mg by mouth daily.    Historical Provider, MD  gabapentin (NEURONTIN) 400 MG capsule Take 3 capsules (1,200 mg total) by mouth 3 (three) times daily. 01/05/15   Levert Feinstein, MD  hydrochlorothiazide (HYDRODIURIL) 25 MG tablet Take 1 tablet by mouth daily. 12/16/14   Historical Provider, MD  levothyroxine (SYNTHROID, LEVOTHROID) 50 MCG tablet Take 50 mcg by mouth daily.    Historical Provider, MD  lisinopril (PRINIVIL,ZESTRIL) 10 MG tablet Take 1 tablet by mouth daily. 12/16/14   Historical Provider, MD  Multiple Vitamin (MULTIVITAMIN WITH MINERALS) TABS tablet Take 1 tablet by mouth every other day.    Historical Provider, MD  mupirocin ointment (BACTROBAN) 2 % Apply 1 application topically 3 (three) times daily as needed (for infection). For right knee    Historical Provider, MD  nortriptyline (PAMELOR) 25  MG capsule Take 1 capsule (25 mg total) by mouth 2 (two) times daily. 01/05/15   Levert FeinsteinYijun Yan, MD  zolpidem (AMBIEN) 10 MG tablet Take 10 mg by mouth at bedtime as needed.    Historical Provider, MD   Meds Ordered and Administered this Visit  Medications - No data to display  BP 118/83 mmHg  Pulse 104  Temp(Src) 97.8 F (36.6 C) (Oral)  SpO2 97% No data found.   Physical Exam  Constitutional: She is oriented to person, place, and time. She appears well-developed and well-nourished. No distress.  Neurological: She is alert and oriented to person, place, and time.  Skin: Skin is warm and dry. Rash noted. There is erythema.   Mult circ sores on bilat u+l ext approx 0.5 cm with scabs  Nursing note and vitals reviewed.   ED Course  Procedures (including critical care time)  Labs Review Labs Reviewed - No data to display  Imaging Review No results found.   Visual Acuity Review  Right Eye Distance:   Left Eye Distance:   Bilateral Distance:    Right Eye Near:   Left Eye Near:    Bilateral Near:         MDM   1. Chronic folliculitis        Linna HoffJames D Deni Berti, MD 01/20/15 1435

## 2015-01-20 NOTE — Discharge Instructions (Signed)
See your doctor if further problems. °

## 2015-01-20 NOTE — ED Notes (Signed)
Pt  Reports    She  Has  Pain  r  Side  Of  Face  Which is  Chronic      -       Pt  States she  Goes  To the  Pain  Center  And  They  Would      Not  See   Her    Because   She  Did  Not have  Her  Id       -  She  Also  Has  Sores  And  Ledsions  On  Her  Arms  Which  Are in  Various  Stages  Of  Healing

## 2015-01-20 NOTE — ED Notes (Signed)
Pt did not answer x 2 

## 2015-01-23 ENCOUNTER — Encounter (HOSPITAL_COMMUNITY): Payer: Self-pay

## 2015-01-23 DIAGNOSIS — S0450XA Injury of facial nerve, unspecified side, initial encounter: Secondary | ICD-10-CM | POA: Insufficient documentation

## 2015-01-27 ENCOUNTER — Telehealth: Payer: Self-pay

## 2015-01-27 NOTE — Telephone Encounter (Signed)
Called pt to r/s her 02/03/2015 apt with Dr. Terrace ArabiaYan...the patient was very rude about how she was told to have an MRI and that wont help her pain and that she is now going to a pain clinic and hung up on me...cx'd apt and will not be r/s apt

## 2015-02-03 ENCOUNTER — Ambulatory Visit: Payer: Medicaid Other | Admitting: Neurology

## 2015-02-24 ENCOUNTER — Emergency Department (HOSPITAL_COMMUNITY): Payer: Medicaid Other

## 2015-02-24 ENCOUNTER — Inpatient Hospital Stay (HOSPITAL_COMMUNITY)
Admission: EM | Admit: 2015-02-24 | Discharge: 2015-03-10 | DRG: 917 | Disposition: A | Discharge status: Skilled Nursing Facility | Payer: Medicaid Other | Attending: Internal Medicine | Admitting: Internal Medicine

## 2015-02-24 ENCOUNTER — Encounter (HOSPITAL_COMMUNITY): Payer: Self-pay | Admitting: Neurology

## 2015-02-24 DIAGNOSIS — E162 Hypoglycemia, unspecified: Secondary | ICD-10-CM | POA: Diagnosis present

## 2015-02-24 DIAGNOSIS — T50902A Poisoning by unspecified drugs, medicaments and biological substances, intentional self-harm, initial encounter: Principal | ICD-10-CM | POA: Diagnosis present

## 2015-02-24 DIAGNOSIS — B1920 Unspecified viral hepatitis C without hepatic coma: Secondary | ICD-10-CM | POA: Diagnosis present

## 2015-02-24 DIAGNOSIS — D649 Anemia, unspecified: Secondary | ICD-10-CM | POA: Diagnosis present

## 2015-02-24 DIAGNOSIS — R4182 Altered mental status, unspecified: Secondary | ICD-10-CM | POA: Diagnosis present

## 2015-02-24 DIAGNOSIS — J9600 Acute respiratory failure, unspecified whether with hypoxia or hypercapnia: Secondary | ICD-10-CM | POA: Diagnosis not present

## 2015-02-24 DIAGNOSIS — F419 Anxiety disorder, unspecified: Secondary | ICD-10-CM | POA: Diagnosis present

## 2015-02-24 DIAGNOSIS — R061 Stridor: Secondary | ICD-10-CM | POA: Diagnosis not present

## 2015-02-24 DIAGNOSIS — Z978 Presence of other specified devices: Secondary | ICD-10-CM | POA: Insufficient documentation

## 2015-02-24 DIAGNOSIS — F1290 Cannabis use, unspecified, uncomplicated: Secondary | ICD-10-CM | POA: Diagnosis present

## 2015-02-24 DIAGNOSIS — G92 Toxic encephalopathy: Secondary | ICD-10-CM | POA: Diagnosis present

## 2015-02-24 DIAGNOSIS — G9340 Encephalopathy, unspecified: Secondary | ICD-10-CM | POA: Diagnosis not present

## 2015-02-24 DIAGNOSIS — Z6838 Body mass index (BMI) 38.0-38.9, adult: Secondary | ICD-10-CM

## 2015-02-24 DIAGNOSIS — R1313 Dysphagia, pharyngeal phase: Secondary | ICD-10-CM | POA: Diagnosis not present

## 2015-02-24 DIAGNOSIS — F319 Bipolar disorder, unspecified: Secondary | ICD-10-CM | POA: Diagnosis present

## 2015-02-24 DIAGNOSIS — T43012A Poisoning by tricyclic antidepressants, intentional self-harm, initial encounter: Secondary | ICD-10-CM | POA: Diagnosis present

## 2015-02-24 DIAGNOSIS — Z9289 Personal history of other medical treatment: Secondary | ICD-10-CM

## 2015-02-24 DIAGNOSIS — T428X2A Poisoning by antiparkinsonism drugs and other central muscle-tone depressants, intentional self-harm, initial encounter: Secondary | ICD-10-CM | POA: Diagnosis present

## 2015-02-24 DIAGNOSIS — J9811 Atelectasis: Secondary | ICD-10-CM | POA: Diagnosis not present

## 2015-02-24 DIAGNOSIS — I10 Essential (primary) hypertension: Secondary | ICD-10-CM | POA: Diagnosis present

## 2015-02-24 DIAGNOSIS — T50902D Poisoning by unspecified drugs, medicaments and biological substances, intentional self-harm, subsequent encounter: Secondary | ICD-10-CM | POA: Diagnosis not present

## 2015-02-24 DIAGNOSIS — J9601 Acute respiratory failure with hypoxia: Secondary | ICD-10-CM | POA: Diagnosis present

## 2015-02-24 DIAGNOSIS — T50901A Poisoning by unspecified drugs, medicaments and biological substances, accidental (unintentional), initial encounter: Secondary | ICD-10-CM | POA: Diagnosis not present

## 2015-02-24 DIAGNOSIS — E669 Obesity, unspecified: Secondary | ICD-10-CM | POA: Diagnosis present

## 2015-02-24 DIAGNOSIS — R401 Stupor: Secondary | ICD-10-CM | POA: Diagnosis not present

## 2015-02-24 DIAGNOSIS — J384 Edema of larynx: Secondary | ICD-10-CM | POA: Diagnosis not present

## 2015-02-24 DIAGNOSIS — E870 Hyperosmolality and hypernatremia: Secondary | ICD-10-CM | POA: Diagnosis present

## 2015-02-24 DIAGNOSIS — E876 Hypokalemia: Secondary | ICD-10-CM | POA: Diagnosis present

## 2015-02-24 DIAGNOSIS — R680 Hypothermia, not associated with low environmental temperature: Secondary | ICD-10-CM | POA: Diagnosis present

## 2015-02-24 DIAGNOSIS — N179 Acute kidney failure, unspecified: Secondary | ICD-10-CM | POA: Diagnosis present

## 2015-02-24 DIAGNOSIS — J9602 Acute respiratory failure with hypercapnia: Secondary | ICD-10-CM | POA: Diagnosis present

## 2015-02-24 DIAGNOSIS — J9690 Respiratory failure, unspecified, unspecified whether with hypoxia or hypercapnia: Secondary | ICD-10-CM

## 2015-02-24 DIAGNOSIS — R7881 Bacteremia: Secondary | ICD-10-CM | POA: Diagnosis not present

## 2015-02-24 DIAGNOSIS — T50904A Poisoning by unspecified drugs, medicaments and biological substances, undetermined, initial encounter: Secondary | ICD-10-CM | POA: Diagnosis not present

## 2015-02-24 DIAGNOSIS — G43909 Migraine, unspecified, not intractable, without status migrainosus: Secondary | ICD-10-CM | POA: Diagnosis present

## 2015-02-24 DIAGNOSIS — E039 Hypothyroidism, unspecified: Secondary | ICD-10-CM | POA: Diagnosis present

## 2015-02-24 DIAGNOSIS — H518 Other specified disorders of binocular movement: Secondary | ICD-10-CM

## 2015-02-24 DIAGNOSIS — Z8619 Personal history of other infectious and parasitic diseases: Secondary | ICD-10-CM | POA: Diagnosis present

## 2015-02-24 DIAGNOSIS — J9589 Other postprocedural complications and disorders of respiratory system, not elsewhere classified: Secondary | ICD-10-CM | POA: Insufficient documentation

## 2015-02-24 DIAGNOSIS — F314 Bipolar disorder, current episode depressed, severe, without psychotic features: Secondary | ICD-10-CM | POA: Diagnosis not present

## 2015-02-24 DIAGNOSIS — T1491 Suicide attempt: Secondary | ICD-10-CM | POA: Diagnosis not present

## 2015-02-24 DIAGNOSIS — Z4659 Encounter for fitting and adjustment of other gastrointestinal appliance and device: Secondary | ICD-10-CM

## 2015-02-24 DIAGNOSIS — Z01818 Encounter for other preprocedural examination: Secondary | ICD-10-CM

## 2015-02-24 LAB — CBC WITH DIFFERENTIAL/PLATELET
BASOS ABS: 0 10*3/uL (ref 0.0–0.1)
BASOS PCT: 0 %
EOS ABS: 0 10*3/uL (ref 0.0–0.7)
Eosinophils Relative: 0 %
HEMATOCRIT: 40.2 % (ref 36.0–46.0)
HEMOGLOBIN: 12.8 g/dL (ref 12.0–15.0)
Lymphocytes Relative: 13 %
Lymphs Abs: 1.2 10*3/uL (ref 0.7–4.0)
MCH: 31.5 pg (ref 26.0–34.0)
MCHC: 31.8 g/dL (ref 30.0–36.0)
MCV: 99 fL (ref 78.0–100.0)
Monocytes Absolute: 0.5 10*3/uL (ref 0.1–1.0)
Monocytes Relative: 5 %
NEUTROS ABS: 7.3 10*3/uL (ref 1.7–7.7)
NEUTROS PCT: 82 %
Platelets: 241 10*3/uL (ref 150–400)
RBC: 4.06 MIL/uL (ref 3.87–5.11)
RDW: 14.5 % (ref 11.5–15.5)
WBC: 8.9 10*3/uL (ref 4.0–10.5)

## 2015-02-24 LAB — COMPREHENSIVE METABOLIC PANEL
ALBUMIN: 4.6 g/dL (ref 3.5–5.0)
ALK PHOS: 81 U/L (ref 38–126)
ALT: 34 U/L (ref 14–54)
AST: 24 U/L (ref 15–41)
Anion gap: 16 — ABNORMAL HIGH (ref 5–15)
BILIRUBIN TOTAL: 0.7 mg/dL (ref 0.3–1.2)
BUN: 13 mg/dL (ref 6–20)
CALCIUM: 9.6 mg/dL (ref 8.9–10.3)
CO2: 26 mmol/L (ref 22–32)
Chloride: 108 mmol/L (ref 101–111)
Creatinine, Ser: 1.01 mg/dL — ABNORMAL HIGH (ref 0.44–1.00)
GFR calc Af Amer: >60 mL/min (ref >60)
GFR calc non Af Amer: >60 mL/min (ref >60)
GLUCOSE: 117 mg/dL — AB (ref 65–99)
POTASSIUM: 3.6 mmol/L (ref 3.5–5.1)
SODIUM: 150 mmol/L — AB (ref 135–145)
TOTAL PROTEIN: 8.4 g/dL — AB (ref 6.5–8.1)

## 2015-02-24 LAB — URINALYSIS, ROUTINE W REFLEX MICROSCOPIC
Bilirubin Urine: NEGATIVE
GLUCOSE, UA: NEGATIVE mg/dL
Ketones, ur: 15 mg/dL — AB
Leukocytes, UA: NEGATIVE
Nitrite: NEGATIVE
PH: 7 (ref 5.0–8.0)
PROTEIN: NEGATIVE mg/dL
Specific Gravity, Urine: 1.015 (ref 1.005–1.030)

## 2015-02-24 LAB — I-STAT ARTERIAL BLOOD GAS, ED
Acid-Base Excess: 1 mmol/L (ref 0.0–2.0)
Bicarbonate: 27.1 mEq/L — ABNORMAL HIGH (ref 20.0–24.0)
O2 Saturation: 95 %
PCO2 ART: 47 mmHg — AB (ref 35.0–45.0)
PH ART: 7.363 (ref 7.350–7.450)
PO2 ART: 74 mmHg — AB (ref 80.0–100.0)
Patient temperature: 35.6
TCO2: 29 mmol/L (ref 0–100)

## 2015-02-24 LAB — RAPID URINE DRUG SCREEN, HOSP PERFORMED
Amphetamines: NOT DETECTED
Barbiturates: NOT DETECTED
Benzodiazepines: NOT DETECTED
COCAINE: NOT DETECTED
OPIATES: NOT DETECTED
TETRAHYDROCANNABINOL: NOT DETECTED

## 2015-02-24 LAB — MAGNESIUM: Magnesium: 2.2 mg/dL (ref 1.7–2.4)

## 2015-02-24 LAB — URINE MICROSCOPIC-ADD ON

## 2015-02-24 LAB — ACETAMINOPHEN LEVEL

## 2015-02-24 LAB — SALICYLATE LEVEL: Salicylate Lvl: <4 mg/dL (ref 2.8–30.0)

## 2015-02-24 LAB — TSH: TSH: 1.211 u[IU]/mL (ref 0.350–4.500)

## 2015-02-24 LAB — PHOSPHORUS: Phosphorus: 4.1 mg/dL (ref 2.5–4.6)

## 2015-02-24 LAB — ETHANOL

## 2015-02-24 LAB — HCG, QUANTITATIVE, PREGNANCY: HCG, BETA CHAIN, QUANT, S: 2 m[IU]/mL (ref <5)

## 2015-02-24 MED ORDER — SODIUM CHLORIDE 0.9 % IV SOLN
250.0000 mL | INTRAVENOUS | Status: DC | PRN
  Administered 2015-03-01: 250 mL via INTRAVENOUS

## 2015-02-24 MED ORDER — LEVOTHYROXINE SODIUM 100 MCG IV SOLR
25.0000 ug | Freq: Every day | INTRAVENOUS | Status: DC
  Administered 2015-02-25 – 2015-02-28 (×4): 25 ug via INTRAVENOUS
  Filled 2015-02-24 (×5): qty 5

## 2015-02-24 MED ORDER — ROCURONIUM BROMIDE 50 MG/5ML IV SOLN
INTRAVENOUS | Status: AC | PRN
  Administered 2015-02-24: 100 mg via INTRAVENOUS

## 2015-02-24 MED ORDER — ETOMIDATE 2 MG/ML IV SOLN
INTRAVENOUS | Status: AC | PRN
  Administered 2015-02-24: 20 mg via INTRAVENOUS

## 2015-02-24 MED ORDER — ENOXAPARIN SODIUM 40 MG/0.4ML ~~LOC~~ SOLN
40.0000 mg | Freq: Every day | SUBCUTANEOUS | Status: DC
  Administered 2015-02-25 – 2015-03-10 (×13): 40 mg via SUBCUTANEOUS
  Filled 2015-02-24 (×15): qty 0.4

## 2015-02-24 MED ORDER — SODIUM BICARBONATE 8.4 % IV SOLN
INTRAVENOUS | Status: AC
  Administered 2015-02-24 – 2015-02-25 (×2): via INTRAVENOUS
  Filled 2015-02-24 (×2): qty 100

## 2015-02-24 MED ORDER — SODIUM CHLORIDE 0.9 % IV BOLUS (SEPSIS): 1000.0000 mL | Freq: Once | INTRAVENOUS | Status: DC

## 2015-02-24 MED ORDER — MIDAZOLAM HCL 2 MG/2ML IJ SOLN
2.0000 mg | INTRAMUSCULAR | Status: DC | PRN
  Administered 2015-02-25 – 2015-02-26 (×7): 2 mg via INTRAVENOUS
  Filled 2015-02-24 (×9): qty 2

## 2015-02-24 MED ORDER — FENTANYL CITRATE (PF) 100 MCG/2ML IJ SOLN
100.0000 ug | INTRAMUSCULAR | Status: DC | PRN
  Administered 2015-02-25 (×6): 100 ug via INTRAVENOUS
  Filled 2015-02-24 (×6): qty 2

## 2015-02-24 MED ORDER — PROPOFOL 1000 MG/100ML IV EMUL
5.0000 ug/kg/min | Freq: Once | INTRAVENOUS | Status: AC
  Administered 2015-02-24: 10 ug/kg/min via INTRAVENOUS
  Filled 2015-02-24: qty 100

## 2015-02-24 MED ORDER — FENTANYL CITRATE (PF) 100 MCG/2ML IJ SOLN
100.0000 ug | INTRAMUSCULAR | Status: AC | PRN
  Administered 2015-02-25 – 2015-02-28 (×3): 100 ug via INTRAVENOUS
  Filled 2015-02-24 (×4): qty 2

## 2015-02-24 MED ORDER — PANTOPRAZOLE SODIUM 40 MG IV SOLR
40.0000 mg | Freq: Every day | INTRAVENOUS | Status: DC
  Administered 2015-02-25 – 2015-02-27 (×4): 40 mg via INTRAVENOUS
  Filled 2015-02-24 (×4): qty 40

## 2015-02-24 MED ORDER — MIDAZOLAM HCL 2 MG/2ML IJ SOLN
2.0000 mg | INTRAMUSCULAR | Status: AC | PRN
  Administered 2015-02-25 (×2): 2 mg via INTRAVENOUS
  Administered 2015-02-28: 4 mg via INTRAVENOUS
  Filled 2015-02-24 (×2): qty 2

## 2015-02-24 MED ORDER — ACETAMINOPHEN 325 MG PO TABS
650.0000 mg | ORAL_TABLET | ORAL | Status: DC | PRN
  Administered 2015-02-26 – 2015-02-28 (×3): 650 mg via ORAL
  Filled 2015-02-24 (×3): qty 2

## 2015-02-24 MED ORDER — PROPOFOL 1000 MG/100ML IV EMUL
INTRAVENOUS | Status: AC
  Filled 2015-02-24: qty 100
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
## 2015-02-24 NOTE — ED Notes (Signed)
Critical care team  Here to see the pt
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
## 2015-02-24 NOTE — ED Notes (Signed)
Team order not to give the  1000 ml nss at present

## 2015-02-24 NOTE — ED Notes (Signed)
Pt awake  Pulling on everything and attempting to get out of the bed.  bolused with  20 mcg of propofol  Then 10 more   4 minutes later.  Pt clamed down  Drip increased to  3630mcg/kg/min

## 2015-02-24 NOTE — Progress Notes (Signed)
ETT pulled back 1 cm per MD request. Now at 21 at lip.

## 2015-02-24 NOTE — ED Notes (Addendum)
Pt belongings: empty bottles of baclofen, gabapentin, nortriptyline, butalbital/acetaminophen/caffeine tabs. Black mesh strong bracelet with tree charm, black cut pants, shirt.

## 2015-02-24 NOTE — ED Notes (Signed)
d5 w 1000cc with sod bocard 100 meq  Added to iv to run at 100 ml /hr.  Bag will not scan
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
## 2015-02-24 NOTE — ED Notes (Signed)
Lab drawn    Pt remains not moving
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
## 2015-02-24 NOTE — ED Notes (Signed)
Dr. Lynelle DoctorKnapp at bedside with resident. Plan to intubate patient.

## 2015-02-25 ENCOUNTER — Inpatient Hospital Stay (HOSPITAL_COMMUNITY): Payer: Medicaid Other

## 2015-02-25 DIAGNOSIS — Z789 Other specified health status: Secondary | ICD-10-CM

## 2015-02-25 DIAGNOSIS — T50901A Poisoning by unspecified drugs, medicaments and biological substances, accidental (unintentional), initial encounter: Secondary | ICD-10-CM

## 2015-02-25 DIAGNOSIS — J9600 Acute respiratory failure, unspecified whether with hypoxia or hypercapnia: Secondary | ICD-10-CM | POA: Insufficient documentation

## 2015-02-25 DIAGNOSIS — R4182 Altered mental status, unspecified: Secondary | ICD-10-CM | POA: Insufficient documentation

## 2015-02-25 DIAGNOSIS — Z978 Presence of other specified devices: Secondary | ICD-10-CM | POA: Insufficient documentation

## 2015-02-25 LAB — GLUCOSE, CAPILLARY
GLUCOSE-CAPILLARY: 104 mg/dL — AB (ref 65–99)
GLUCOSE-CAPILLARY: 64 mg/dL — AB (ref 65–99)
Glucose-Capillary: 72 mg/dL (ref 65–99)
Glucose-Capillary: 58 mg/dL — ABNORMAL LOW (ref 65–99)
Glucose-Capillary: 91 mg/dL (ref 65–99)

## 2015-02-25 LAB — I-STAT ARTERIAL BLOOD GAS, ED
Acid-Base Excess: 4 mmol/L — ABNORMAL HIGH (ref 0.0–2.0)
Acid-Base Excess: 5 mmol/L — ABNORMAL HIGH (ref 0.0–2.0)
Bicarbonate: 29.3 mEq/L — ABNORMAL HIGH (ref 20.0–24.0)
Bicarbonate: 30 mEq/L — ABNORMAL HIGH (ref 20.0–24.0)
O2 SAT: 97 %
O2 SAT: 98 %
PCO2 ART: 43.1 mmHg (ref 35.0–45.0)
PCO2 ART: 45.3 mmHg — AB (ref 35.0–45.0)
PH ART: 7.418 (ref 7.350–7.450)
PH ART: 7.448 (ref 7.350–7.450)
PO2 ART: 89 mmHg (ref 80.0–100.0)
PO2 ART: 99 mmHg (ref 80.0–100.0)
Patient temperature: 36.3
Patient temperature: 98.1
TCO2: 31 mmol/L (ref 0–100)
TCO2: 31 mmol/L (ref 0–100)

## 2015-02-25 LAB — BASIC METABOLIC PANEL
ANION GAP: 9 (ref 5–15)
ANION GAP: 12 (ref 5–15)
BUN: 11 mg/dL (ref 6–20)
BUN: 11 mg/dL (ref 6–20)
CALCIUM: 8.6 mg/dL — AB (ref 8.9–10.3)
CHLORIDE: 111 mmol/L (ref 101–111)
CO2: 29 mmol/L (ref 22–32)
CO2: 27 mmol/L (ref 22–32)
Calcium: 9 mg/dL (ref 8.9–10.3)
Chloride: 106 mmol/L (ref 101–111)
Creatinine, Ser: 0.74 mg/dL (ref 0.44–1.00)
Creatinine, Ser: 0.73 mg/dL (ref 0.44–1.00)
GLUCOSE: 108 mg/dL — AB (ref 65–99)
Glucose, Bld: 122 mg/dL — ABNORMAL HIGH (ref 65–99)
POTASSIUM: 3.2 mmol/L — AB (ref 3.5–5.1)
POTASSIUM: 4.4 mmol/L (ref 3.5–5.1)
SODIUM: 149 mmol/L — AB (ref 135–145)
Sodium: 145 mmol/L (ref 135–145)

## 2015-02-25 LAB — CBC
HCT: 36.6 % (ref 36.0–46.0)
Hemoglobin: 11.6 g/dL — ABNORMAL LOW (ref 12.0–15.0)
MCH: 31.3 pg (ref 26.0–34.0)
MCHC: 31.7 g/dL (ref 30.0–36.0)
MCV: 98.7 fL (ref 78.0–100.0)
PLATELETS: 252 10*3/uL (ref 150–400)
RBC: 3.71 MIL/uL — AB (ref 3.87–5.11)
RDW: 14.7 % (ref 11.5–15.5)
WBC: 8.1 10*3/uL (ref 4.0–10.5)

## 2015-02-25 LAB — POCT I-STAT 3, ART BLOOD GAS (G3+)
Acid-Base Excess: 6 mmol/L — ABNORMAL HIGH (ref 0.0–2.0)
BICARBONATE: 32.1 meq/L — AB (ref 20.0–24.0)
O2 Saturation: 96 %
PCO2 ART: 50.8 mmHg — AB (ref 35.0–45.0)
PH ART: 7.408 (ref 7.350–7.450)
PO2 ART: 82 mmHg (ref 80.0–100.0)
TCO2: 34 mmol/L (ref 0–100)

## 2015-02-25 LAB — MRSA PCR SCREENING: MRSA BY PCR: NEGATIVE

## 2015-02-25 LAB — PROTIME-INR
INR: 1.04 (ref 0.00–1.49)
Prothrombin Time: 13.8 seconds (ref 11.6–15.2)

## 2015-02-25 LAB — URINE CULTURE

## 2015-02-25 LAB — HIV ANTIBODY (ROUTINE TESTING W REFLEX): HIV SCREEN 4TH GENERATION: NONREACTIVE

## 2015-02-25 LAB — PHOSPHORUS: PHOSPHORUS: 3.6 mg/dL (ref 2.5–4.6)

## 2015-02-25 LAB — APTT: aPTT: 27 seconds (ref 24–37)

## 2015-02-25 LAB — MAGNESIUM: MAGNESIUM: 2.1 mg/dL (ref 1.7–2.4)

## 2015-02-25 LAB — TRIGLYCERIDES: TRIGLYCERIDES: 161 mg/dL — AB (ref <150)

## 2015-02-25 MED ORDER — SODIUM CHLORIDE 0.9 % IV SOLN
0.0000 ug/h | INTRAVENOUS | Status: DC
  Administered 2015-02-25: 50 ug/h via INTRAVENOUS
  Administered 2015-02-26: 75 ug/h via INTRAVENOUS
  Administered 2015-02-27: 150 ug/h via INTRAVENOUS
  Administered 2015-02-27: 200 ug/h via INTRAVENOUS
  Administered 2015-02-28: 100 ug/h via INTRAVENOUS
  Filled 2015-02-25 (×5): qty 50

## 2015-02-25 MED ORDER — PROPOFOL 1000 MG/100ML IV EMUL
5.0000 ug/kg/min | INTRAVENOUS | Status: DC
  Administered 2015-02-25: 80 ug/kg/min via INTRAVENOUS
  Administered 2015-02-25: 55 ug/kg/min via INTRAVENOUS
  Administered 2015-02-25: 80 ug/kg/min via INTRAVENOUS
  Administered 2015-02-25: 55 ug/kg/min via INTRAVENOUS
  Administered 2015-02-25 – 2015-02-26 (×10): 80 ug/kg/min via INTRAVENOUS
  Administered 2015-02-26: 50 ug/kg/min via INTRAVENOUS
  Administered 2015-02-26: 80 ug/kg/min via INTRAVENOUS
  Administered 2015-02-26: 40 ug/kg/min via INTRAVENOUS
  Administered 2015-02-26 (×2): 80 ug/kg/min via INTRAVENOUS
  Administered 2015-02-27: 50 ug/kg/min via INTRAVENOUS
  Administered 2015-02-27: 60 ug/kg/min via INTRAVENOUS
  Administered 2015-02-27: 80 ug/kg/min via INTRAVENOUS
  Administered 2015-02-27 (×2): 40 ug/kg/min via INTRAVENOUS
  Administered 2015-02-27: 80 ug/kg/min via INTRAVENOUS
  Administered 2015-02-28: 30 ug/kg/min via INTRAVENOUS
  Administered 2015-02-28 (×2): 60 ug/kg/min via INTRAVENOUS
  Administered 2015-02-28: 30 ug/kg/min via INTRAVENOUS
  Filled 2015-02-25 (×4): qty 100
  Filled 2015-02-25: qty 200
  Filled 2015-02-25 (×2): qty 100
  Filled 2015-02-25: qty 200
  Filled 2015-02-25 (×4): qty 100
  Filled 2015-02-25: qty 200
  Filled 2015-02-25: qty 100
  Filled 2015-02-25 (×2): qty 200
  Filled 2015-02-25 (×4): qty 100
  Filled 2015-02-25: qty 200
  Filled 2015-02-25 (×6): qty 100

## 2015-02-25 MED ORDER — VITAL HIGH PROTEIN PO LIQD
1000.0000 mL | ORAL | Status: DC
  Administered 2015-02-25 – 2015-02-28 (×4): 1000 mL
  Administered 2015-02-28: 09:00:00
  Administered 2015-02-28: 1000 mL

## 2015-02-25 MED ORDER — POTASSIUM CHLORIDE 10 MEQ/100ML IV SOLN
10.0000 meq | INTRAVENOUS | Status: AC
  Administered 2015-02-25 (×3): 10 meq via INTRAVENOUS
  Filled 2015-02-25 (×3): qty 100

## 2015-02-25 MED ORDER — SODIUM BICARBONATE 8.4 % IV SOLN
INTRAVENOUS | Status: AC
  Administered 2015-02-25 (×2): via INTRAVENOUS
  Filled 2015-02-25: qty 100

## 2015-02-25 MED ORDER — ADULT MULTIVITAMIN W/MINERALS CH
1.0000 | ORAL_TABLET | Freq: Every day | ORAL | Status: DC
  Administered 2015-02-25 – 2015-03-10 (×13): 1
  Filled 2015-02-25 (×13): qty 1

## 2015-02-25 MED ORDER — PRO-STAT SUGAR FREE PO LIQD
60.0000 mL | Freq: Three times a day (TID) | ORAL | Status: DC
  Administered 2015-02-25 – 2015-03-02 (×13): 60 mL
  Filled 2015-02-25 (×14): qty 60

## 2015-02-25 MED ORDER — DEXTROSE 50 % IV SOLN
INTRAVENOUS | Status: AC
  Administered 2015-02-25: 25 mL
  Filled 2015-02-25: qty 50

## 2015-02-25 MED ORDER — VITAL HIGH PROTEIN PO LIQD
1000.0000 mL | ORAL | Status: DC
  Administered 2015-02-25: 1000 mL

## 2015-02-25 MED ORDER — DEXTROSE 5 % IV SOLN
INTRAVENOUS | Status: DC
  Administered 2015-02-25: 12:00:00 via INTRAVENOUS

## 2015-02-25 MED ORDER — ANTISEPTIC ORAL RINSE SOLUTION (CORINZ)
7.0000 mL | OROMUCOSAL | Status: DC
  Administered 2015-02-25 – 2015-02-26 (×11): 7 mL via OROMUCOSAL

## 2015-02-25 MED ORDER — DEXTROSE 50 % IV SOLN: 25.0000 mL | Freq: Once | INTRAVENOUS | Status: AC

## 2015-02-25 NOTE — Progress Notes (Signed)
RT decreased FIO2 to 50% per ABG results. RT will continue to monitor.

## 2015-02-25 NOTE — Progress Notes (Signed)
eLink Physician-Brief Progress Note Patient Name: Brittany [name] DOB: 02-Oct-1965 MRN: [id number]   Date of Service  02/25/2015  HPI/Events of Note  Hypoglycemia - blood glucose 60 X 2. Currently on D5W at 30 mL/hour.   eICU Interventions  Will increase D5W rate to 60 mL/hour.      Intervention Category Major Interventions: Other:  Lenell AntuSommer,Steven Eugene 02/25/2015, 7:57 PM

## 2015-02-25 NOTE — ED Notes (Signed)
Pt agitated  porpofol  4th vial hung.  Fentanyl  And versed  given

## 2015-02-25 NOTE — ED Notes (Signed)
Pt becoming more active  Opening her eyes.  bolused  With 10 mcg/kg/min

## 2015-02-25 NOTE — ED Notes (Signed)
Talked to pharmacist greg  About sending lovenox  His response that is  For dvt  We will not start until she goes to the floor

## 2015-02-25 NOTE — ED Notes (Signed)
bolused with 10 mcg prop

## 2015-02-25 NOTE — Progress Notes (Signed)
Initial Nutrition Assessment  DOCUMENTATION CODES:   Obesity unspecified  INTERVENTION:  Initiate Vital High Protein @ 10 ml/hr via OG tube   60 ml Prostat TID.    Tube feeding regimen provides 840 kcal, 111 grams of protein, and 200 ml of H2O.  TF regimen and propofol at current rate providing 2170 total kcal/day (20 kcal/kg)  NUTRITION DIAGNOSIS:   Inadequate oral intake related to inability to eat as evidenced by NPO status.  GOAL:   Provide needs based on ASPEN/SCCM guidelines  MONITOR:   TF tolerance, I & O's, Vent status, Labs  REASON FOR ASSESSMENT:   Consult Enteral/tube feeding initiation and management  ASSESSMENT:   Pt with past medical history of hypertension, hypothyroidism, depression, bipolar disorder, anxiety, and history of IV drug use presenting OD.  Patient is currently intubated on ventilator support MV: 6.2 L/min Temp (24hrs), Avg:97 F (36.1 C), Min:95.4 F (35.2 C), Max:98.1 F (36.7 C)  Propofol: 50.4 ml/hr provides: 1330 kcal per day from lipid Labs reviewed: sodium elevated 149, potassium low 3.2 OG tube Nutrition-Focused physical exam completed. Findings are no fat depletion, no muscle depletion, and no edema.  Discussed above with RN.   Diet Order:  Diet NPO time specified  Skin:  Reviewed, no issues  Last BM:  unknown  Height:   Ht Readings from Last 1 Encounters:  02/24/15 5' 4.17" (1.63 m)   Weight:   Wt Readings from Last 1 Encounters:  02/24/15 229 lb 4.5 oz (104 kg)   Ideal Body Weight:  54.5 kg  BMI:  Body mass index is 39.14 kg/(m^2).  Estimated Nutritional Needs:   Kcal:  1610-96041144-1456  Protein:  >109 grams  Fluid:  > 1.5 L/day  EDUCATION NEEDS:   No education needs identified at this time  Kendell BaneHeather Pearline Yerby RD, LDN, CNSC [phone] Pager [phone] After Hours Pager

## 2015-02-25 NOTE — ED Notes (Signed)
Propofol 2nd bottle hung.  Tubing changed before hanging the 2nd bottle

## 2015-02-25 NOTE — ED Notes (Signed)
Heating blanket removed

## 2015-02-25 NOTE — Care Management Note (Signed)
Case Management Note  Patient Details  Name: [name] MRN: [id number] Date of Birth: 1965-02-17  Subjective/Objective:   Adm w drug overdose               Action/Plan: lives w husband   Expected Discharge Date:                  [facility]  In-House Referral:  Clinical Social Work  Discharge planning Services     Post Acute Care Choice:    Choice offered to:     DME Arranged:    DME Agency:     HH Arranged:    HH Agency:     Status of Service:     Medicare Important Message Given:    Date Medicare IM Given:    Medicare IM give by:    Date Additional Medicare IM Given:    Additional Medicare Important Message give by:     If discussed at Long Length of Stay Meetings, dates discussed:    Additional Comments: ur review done  Hanley HaysDowell, Burney Calzadilla T, RN 02/25/2015, 10:01 AM
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
## 2015-02-25 NOTE — Progress Notes (Signed)
eLink Physician-Brief Progress Note Patient Name: [name] DOB: 1965-05-16 MRN: [id number]   Date of Service  02/25/2015  HPI/Events of Note  Delirium - Hx of Bipolar disorder and drug abuse. Current sedation with Propofol IV infusion, Fentanyl IV PRN and Versed IV PRN.   eICU Interventions  Will order: 1. Fentanyl IV infusion 0-200 mcg/hour. Titrate to RASS 0 to -1.      Intervention Category Major Interventions: Delirium, psychosis, severe agitation - evaluation and management  Sommer,Steven Eugene 02/25/2015, 7:29 PM

## 2015-02-25 NOTE — ED Notes (Signed)
bolused with porp ofiol  Rate increased to 35 mcg/kg/min

## 2015-02-25 NOTE — ED Notes (Signed)
Am labs drawn and port chest xray pt is quiet

## 2015-02-25 NOTE — ED Notes (Signed)
Pt rocking the stretcher with her legs  Moving.  3rd bottle of propofol hung.  Pt bolused with 20 mcg porpofol

## 2015-02-25 NOTE — ED Notes (Signed)
Pt moving around on stretcher bolused with 20 mcg propofol  Drip incrfeased to 5635mcg/kg/min

## 2015-02-26 ENCOUNTER — Inpatient Hospital Stay (HOSPITAL_COMMUNITY): Payer: Medicaid Other

## 2015-02-26 DIAGNOSIS — Z8619 Personal history of other infectious and parasitic diseases: Secondary | ICD-10-CM | POA: Diagnosis present

## 2015-02-26 DIAGNOSIS — R401 Stupor: Secondary | ICD-10-CM

## 2015-02-26 LAB — POCT I-STAT 3, ART BLOOD GAS (G3+)
Acid-Base Excess: 7 mmol/L — ABNORMAL HIGH (ref 0.0–2.0)
BICARBONATE: 33.3 meq/L — AB (ref 20.0–24.0)
O2 Saturation: 94 %
PCO2 ART: 52.1 mmHg — AB (ref 35.0–45.0)
TCO2: 35 mmol/L (ref 0–100)
pH, Arterial: 7.413 (ref 7.350–7.450)
pO2, Arterial: 70 mmHg — ABNORMAL LOW (ref 80.0–100.0)

## 2015-02-26 LAB — BASIC METABOLIC PANEL
Anion gap: 8 (ref 5–15)
BUN: 9 mg/dL (ref 6–20)
CHLORIDE: 103 mmol/L (ref 101–111)
CO2: 31 mmol/L (ref 22–32)
Calcium: 9.1 mg/dL (ref 8.9–10.3)
Creatinine, Ser: 0.64 mg/dL (ref 0.44–1.00)
GFR calc Af Amer: >60 mL/min (ref >60)
GFR calc non Af Amer: >60 mL/min (ref >60)
GLUCOSE: 102 mg/dL — AB (ref 65–99)
POTASSIUM: 3.9 mmol/L (ref 3.5–5.1)
SODIUM: 142 mmol/L (ref 135–145)

## 2015-02-26 LAB — CBC WITH DIFFERENTIAL/PLATELET
BASOS ABS: 0 10*3/uL (ref 0.0–0.1)
BASOS PCT: 0 %
EOS ABS: 0.1 10*3/uL (ref 0.0–0.7)
Eosinophils Relative: 2 %
HEMATOCRIT: 33.5 % — AB (ref 36.0–46.0)
HEMOGLOBIN: 10.6 g/dL — AB (ref 12.0–15.0)
Lymphocytes Relative: 19 %
Lymphs Abs: 1.5 10*3/uL (ref 0.7–4.0)
MCH: 31.3 pg (ref 26.0–34.0)
MCHC: 31.6 g/dL (ref 30.0–36.0)
MCV: 98.8 fL (ref 78.0–100.0)
MONOS PCT: 4 %
Monocytes Absolute: 0.3 10*3/uL (ref 0.1–1.0)
NEUTROS ABS: 5.8 10*3/uL (ref 1.7–7.7)
NEUTROS PCT: 75 %
Platelets: 205 10*3/uL (ref 150–400)
RBC: 3.39 MIL/uL — AB (ref 3.87–5.11)
RDW: 15 % (ref 11.5–15.5)
WBC: 7.8 10*3/uL (ref 4.0–10.5)

## 2015-02-26 LAB — COMPREHENSIVE METABOLIC PANEL
ALK PHOS: 65 U/L (ref 38–126)
ALT: 30 U/L (ref 14–54)
ANION GAP: 11 (ref 5–15)
AST: 37 U/L (ref 15–41)
Albumin: 3.3 g/dL — ABNORMAL LOW (ref 3.5–5.0)
BILIRUBIN TOTAL: 0.6 mg/dL (ref 0.3–1.2)
BUN: 8 mg/dL (ref 6–20)
CALCIUM: 8.7 mg/dL — AB (ref 8.9–10.3)
CO2: 29 mmol/L (ref 22–32)
CREATININE: 0.65 mg/dL (ref 0.44–1.00)
Chloride: 101 mmol/L (ref 101–111)
Glucose, Bld: 124 mg/dL — ABNORMAL HIGH (ref 65–99)
Potassium: 2.8 mmol/L — ABNORMAL LOW (ref 3.5–5.1)
SODIUM: 141 mmol/L (ref 135–145)
TOTAL PROTEIN: 6.1 g/dL — AB (ref 6.5–8.1)

## 2015-02-26 LAB — GLUCOSE, CAPILLARY
GLUCOSE-CAPILLARY: 64 mg/dL — AB (ref 65–99)
GLUCOSE-CAPILLARY: 74 mg/dL (ref 65–99)
Glucose-Capillary: 102 mg/dL — ABNORMAL HIGH (ref 65–99)
Glucose-Capillary: 70 mg/dL (ref 65–99)
Glucose-Capillary: 75 mg/dL (ref 65–99)
Glucose-Capillary: 80 mg/dL (ref 65–99)
Glucose-Capillary: 80 mg/dL (ref 65–99)
Glucose-Capillary: 96 mg/dL (ref 65–99)

## 2015-02-26 LAB — HCV AB VERIFICATION: HCV Antibody Verification: REACTIVE — AB

## 2015-02-26 LAB — HEPATITIS C ANTIBODY (REFLEX): HCV AB: 5.8 {s_co_ratio} — AB (ref 0.0–0.9)

## 2015-02-26 LAB — COMMENT3 - HEP PANEL

## 2015-02-26 MED ORDER — POTASSIUM CHLORIDE 20 MEQ/15ML (10%) PO SOLN
40.0000 meq | ORAL | Status: AC
  Administered 2015-02-26 (×2): 40 meq
  Filled 2015-02-26 (×2): qty 30

## 2015-02-26 MED ORDER — ANTISEPTIC ORAL RINSE SOLUTION (CORINZ)
7.0000 mL | OROMUCOSAL | Status: DC
  Administered 2015-02-26 – 2015-02-28 (×17): 7 mL via OROMUCOSAL

## 2015-02-26 MED ORDER — DEXTROSE 10 % IV SOLN
INTRAVENOUS | Status: DC
  Administered 2015-02-26 – 2015-03-01 (×6): via INTRAVENOUS

## 2015-02-26 MED ORDER — CHLORHEXIDINE GLUCONATE 0.12% ORAL RINSE (MEDLINE KIT)
15.0000 mL | Freq: Two times a day (BID) | OROMUCOSAL | Status: DC
Start: 2015-02-26 — End: 2015-02-28
  Administered 2015-02-26 – 2015-02-28 (×5): 15 mL via OROMUCOSAL

## 2015-02-26 MED ORDER — DEXTROSE 50 % IV SOLN
25.0000 mL | Freq: Once | INTRAVENOUS | Status: AC
  Administered 2015-02-26: 25 mL via INTRAVENOUS

## 2015-02-26 NOTE — Progress Notes (Signed)
CRITICAL VALUE ALERT  Critical value received: cbg=64  Date of notification:  1/13  Time of notification:  0010  Critical value read back:yes  Nurse who received alert:  cb  MD notified (1st page):  deterding  Time of first page:   MD notified (2nd page):  Time of second page:  Responding MD:   Time MD responded:

## 2015-02-26 NOTE — Progress Notes (Signed)
eLink Physician-Brief Progress Note Patient Name: [name] DOB: 12-10-1965 MRN: [id number]   Date of Service  02/26/2015  HPI/Events of Note  Persistent hypoglycemia despite increases in D5W infusion  eICU Interventions  Plan: Change to D10 at 50 cc/hr     Intervention Category Intermediate Interventions: Other:  DETERDING,ELIZABETH 02/26/2015, 12:12 AM

## 2015-02-26 NOTE — Progress Notes (Signed)
eLink Physician-Brief Progress Note Patient Name: [name] DOB: 1965/12/18 MRN: [id number]   Date of Service  02/26/2015  HPI/Events of Note  Fever to 101.2 F. Hx of IV drug abuse. Urine Cxs have been negative. WBC = 7.8.  I am underwhelmed by AM CXR as a potential infectious source. However, can't r/o SBE. Will hold off on Abx at this time. Follow fever curve.  eICU Interventions  Will order: 1. Blood Cultures X 2.     Intervention Category Major Interventions: Infection - evaluation and management  Sommer,Steven Eugene 02/26/2015, 6:05 PM

## 2015-02-26 NOTE — Progress Notes (Signed)
Patient remains on vent at this time. CSW has staffed case with IP Psych CSW, Vickii PennaGina Ingle, who will follow up with Patient once no longer ventilated. CSW will continue to follow.    Noe GensAshley Gardner, LCSWA [facility] Clinical Social Worker [phone]

## 2015-02-26 NOTE — Progress Notes (Signed)
Patient returned to full support due to low RR <9 and low Ve. RT will continue to monitor patient.

## 2015-02-26 NOTE — Progress Notes (Signed)
PCCM INTERVAL PROGRESS NOTE   Asked to evaluate patient due to unable to draw labs. Phlebotomy and IV team have both attempted to draw and have been unsuccessful. Evaluated patient to place CVL, however, when calling for consent only phone number goes straight to voicemail. Will ask respiratory therapy to assess for radial arterial line placement as she is on vent needing ABG monitoring, BP monitoring, and frequent lab draws.   Joneen RoachPaul Hoffman, AGACNP-BC [facility] Pager [phone] or [phone]  02/26/2015 1:24 AM

## 2015-02-26 NOTE — Procedures (Signed)
Arterial Catheter Insertion Procedure Note [name] [id number] October 02, 1965  Procedure: Insertion of Arterial Catheter  Indications: Blood pressure monitoring  Procedure Details Consent: Unable to obtain consent because of altered level of consciousness. Time Out: Verified patient identification, verified procedure, site/side was marked, verified correct patient position, special equipment/implants available, medications/allergies/relevent history reviewed, required imaging and test results available.  Performed  Maximum sterile technique was used including antiseptics, cap, gloves, gown, hand hygiene, mask and sheet. Skin prep: Chlorhexidine; local anesthetic administered 20 gauge catheter was inserted into right radial artery using the Seldinger technique.  Evaluation Blood flow good; BP tracing good. Complications: No apparent complications.   Zipporah PlantsDay, Trenae Brunke P 02/26/2015
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
## 2015-02-26 NOTE — Progress Notes (Signed)
eLink Physician-Brief Progress Note Patient Name: Brittany [name] DOB: 1965-06-27 MRN: [id number]   Date of Service  02/26/2015  HPI/Events of Note  Hypokalemia  eICU Interventions  Potassium replaced     Intervention Category Intermediate Interventions: Electrolyte abnormality - evaluation and management  DETERDING,ELIZABETH 02/26/2015, 4:21 AM

## 2015-02-27 ENCOUNTER — Inpatient Hospital Stay (HOSPITAL_COMMUNITY): Payer: Medicaid Other

## 2015-02-27 DIAGNOSIS — T50902D Poisoning by unspecified drugs, medicaments and biological substances, intentional self-harm, subsequent encounter: Secondary | ICD-10-CM

## 2015-02-27 LAB — CBC WITH DIFFERENTIAL/PLATELET
Basophils Absolute: 0 10*3/uL (ref 0.0–0.1)
Basophils Relative: 0 %
EOS ABS: 0.1 10*3/uL (ref 0.0–0.7)
EOS PCT: 2 %
HCT: 30.4 % — ABNORMAL LOW (ref 36.0–46.0)
Hemoglobin: 9.8 g/dL — ABNORMAL LOW (ref 12.0–15.0)
LYMPHS ABS: 1.4 10*3/uL (ref 0.7–4.0)
LYMPHS PCT: 24 %
MCH: 32.1 pg (ref 26.0–34.0)
MCHC: 32.2 g/dL (ref 30.0–36.0)
MCV: 99.7 fL (ref 78.0–100.0)
MONO ABS: 0.6 10*3/uL (ref 0.1–1.0)
MONOS PCT: 10 %
Neutro Abs: 3.8 10*3/uL (ref 1.7–7.7)
Neutrophils Relative %: 64 %
PLATELETS: 199 10*3/uL (ref 150–400)
RBC: 3.05 MIL/uL — AB (ref 3.87–5.11)
RDW: 15.1 % (ref 11.5–15.5)
WBC: 6 10*3/uL (ref 4.0–10.5)

## 2015-02-27 LAB — COMPREHENSIVE METABOLIC PANEL
ALBUMIN: 3 g/dL — AB (ref 3.5–5.0)
ALT: 36 U/L (ref 14–54)
AST: 46 U/L — AB (ref 15–41)
Alkaline Phosphatase: 70 U/L (ref 38–126)
Anion gap: 9 (ref 5–15)
BUN: 12 mg/dL (ref 6–20)
CHLORIDE: 103 mmol/L (ref 101–111)
CO2: 30 mmol/L (ref 22–32)
CREATININE: 0.68 mg/dL (ref 0.44–1.00)
Calcium: 8.7 mg/dL — ABNORMAL LOW (ref 8.9–10.3)
GFR calc Af Amer: >60 mL/min (ref >60)
GFR calc non Af Amer: >60 mL/min (ref >60)
GLUCOSE: 97 mg/dL (ref 65–99)
POTASSIUM: 3.6 mmol/L (ref 3.5–5.1)
SODIUM: 142 mmol/L (ref 135–145)
Total Bilirubin: 0.8 mg/dL (ref 0.3–1.2)
Total Protein: 6.2 g/dL — ABNORMAL LOW (ref 6.5–8.1)

## 2015-02-27 LAB — BASIC METABOLIC PANEL
ANION GAP: 8 (ref 5–15)
BUN: 11 mg/dL (ref 6–20)
CALCIUM: 8.7 mg/dL — AB (ref 8.9–10.3)
CO2: 29 mmol/L (ref 22–32)
CREATININE: 0.63 mg/dL (ref 0.44–1.00)
Chloride: 103 mmol/L (ref 101–111)
GFR calc Af Amer: >60 mL/min (ref >60)
GFR calc non Af Amer: >60 mL/min (ref >60)
GLUCOSE: 99 mg/dL (ref 65–99)
Potassium: 3.7 mmol/L (ref 3.5–5.1)
Sodium: 140 mmol/L (ref 135–145)

## 2015-02-27 LAB — GLUCOSE, CAPILLARY
GLUCOSE-CAPILLARY: 90 mg/dL (ref 65–99)
GLUCOSE-CAPILLARY: 81 mg/dL (ref 65–99)
Glucose-Capillary: 80 mg/dL (ref 65–99)
Glucose-Capillary: 104 mg/dL — ABNORMAL HIGH (ref 65–99)
Glucose-Capillary: 93 mg/dL (ref 65–99)

## 2015-02-27 NOTE — Clinical Social Work Note (Signed)
Patient currently on ventilator at 30%. Psych CSW aware of patient and plans to follow up/assess once patient is extubated and medically appropriate for psychosocial assessment.    CW will continue to follow for medical readiness.   Derenda FennelBashira Yoon Barca, MSW, LCSWA [phone] 02/27/2015 10:31 AM

## 2015-02-27 NOTE — Progress Notes (Signed)
PULMONARY / CRITICAL CARE MEDICINE   Name: Brittany [name] MRN: [id number] DOB: March 26, 1965    ADMISSION DATE:  02/24/2015 CONSULTATION DATE:  02/24/15  REFERRING MD :  Emergency Room (Dr. Linwood DibblesJon Knapp) PRIMARY SERVICE: Critical Care   CHIEF COMPLAINT:  Altered mental status  HISTORY OF PRESENT ILLNESS:  Patient is currently on vent and under sedation, history was obtained from emergency room documentation. Patient is a 50 year old female with a past medical history of hypertension, hypothyroidism, depression, bipolar disorder, anxiety, and history of IV drug use presenting OD with  10 pills from her baclofen bottle and possibly 50 pills from her nortriptyline bottle.  SUBJECTIVE: tracking, blinking eyes  VITAL SIGNS: Temp:  [99.7 F (37.6 C)-101.3 F (38.5 C)] 100 F (37.8 C) (01/14 1200) Pulse Rate:  [83-115] 93 (01/14 1200) Resp:  [11-18] 14 (01/14 1200) BP: (104-148)/(59-75) 108/66 mmHg (01/14 1200) SpO2:  [92 %-100 %] 100 % (01/14 1200) FiO2 (%):  [30 %] 30 % (01/14 0959) Weight:  [241 lb 2.9 oz (109.4 kg)] 241 lb 2.9 oz (109.4 kg) (01/14 0500) HEMODYNAMICS:   VENTILATOR SETTINGS: Vent Mode:  [-] PRVC FiO2 (%):  [30 %] 30 % Set Rate:  [14 bmp] 14 bmp Vt Set:  [450 mL] 450 mL PEEP:  [5 cmH20] 5 cmH20 Plateau Pressure:  [13 cmH20-32 cmH20] 17 cmH20 INTAKE / OUTPUT: Intake/Output      01/13 0701 - 01/14 0700 01/14 0701 - 01/15 0700   I.V. (mL/kg) 2694.5 (24.6) 487.2 (4.5)   NG/GT 895 50   Total Intake(mL/kg) 3589.5 (32.8) 537.2 (4.9)   Urine (mL/kg/hr) 2750 (1) 750 (1.2)   Emesis/NG output     Total Output 2750 750   Net +839.5 -212.8          PHYSICAL EXAMINATION: General:  On ventilator, not fc Neuro: moves all ext, perr HEENT:  Pupils are dilated and and non-reactive bilaterally.  Cardiovascular:  RRR. S1 and S2  Lungs: coarse ronchi Abdomen: Soft, + bowel sounds, non-distended, no rigidity, not tender Musculoskeletal:  No edema. Skin:  Warm and  dry.  LABS:  CBC  Recent Labs Lab 02/25/15 0320 02/26/15 0305 02/27/15 0410  WBC 8.1 7.8 6.0  HGB 11.6* 10.6* 9.8*  HCT 36.6 33.5* 30.4*  PLT 252 205 199   Coag's  Recent Labs Lab 02/25/15 0320  APTT 27  INR 1.04   BMET  Recent Labs Lab 02/26/15 0305 02/26/15 1245 02/27/15 0006  NA 141 142 142  K 2.8* 3.9 3.6  CL 101 103 103  CO2 29 31 30   BUN 8 9 12   CREATININE 0.65 0.64 0.68  GLUCOSE 124* 102* 97   Electrolytes  Recent Labs Lab 02/24/15 2226 02/25/15 0320  02/26/15 0305 02/26/15 1245 02/27/15 0006  CALCIUM  --  9.0  < > 8.7* 9.1 8.7*  MG 2.2 2.1  --   --   --   --   PHOS 4.1 3.6  --   --   --   --   < > = values in this interval not displayed. Sepsis Markers No results for input(s): LATICACIDVEN, PROCALCITON, O2SATVEN in the last 168 hours. ABG  Recent Labs Lab 02/25/15 0418 02/25/15 1323 02/26/15 0341  PHART 7.448 7.408 7.413  PCO2ART 43.1 50.8* 52.1*  PO2ART 99.0 82.0 70.0*   Liver Enzymes  Recent Labs Lab 02/24/15 1833 02/26/15 0305 02/27/15 0006  AST 24 37 46*  ALT 34 30 36  ALKPHOS 81 65 70  BILITOT 0.7 0.6 0.8  ALBUMIN 4.6 3.3* 3.0*   Cardiac Enzymes No results for input(s): TROPONINI, PROBNP in the last 168 hours. Glucose  Recent Labs Lab 02/26/15 1629 02/26/15 2033 02/26/15 2336 02/27/15 0409 02/27/15 0800 02/27/15 1123  GLUCAP 80 96 80 80 90 104*    Imaging Mr Brain Wo Contrast  02/27/2015  CLINICAL DATA:  Drug overdose. History of hypertension, IV drug use, migraine, facial nerve injury. EXAM: MRI HEAD WITHOUT CONTRAST TECHNIQUE: Multiplanar, multiecho pulse sequences of the brain and surrounding structures were obtained without intravenous contrast. COMPARISON:  CT head February 24, 2015 FINDINGS: The ventricles and sulci are normal for patient's age. No abnormal parenchymal signal, mass lesions, mass effect. No reduced diffusion to suggest acute ischemia. No susceptibility artifact to suggest hemorrhage. No  abnormal extra-axial fluid collections. No extra-axial masses though, contrast enhanced sequences would be more sensitive. Normal major intracranial vascular flow voids seen at the skull base. Ocular globes and orbital contents are unremarkable though not tailored for evaluation. No abnormal sellar expansion. No suspicious calvarial bone marrow signal. Craniocervical junction maintained. Mild paranasal sinus mucosal thickening, sphenoid sinus air fluid levels. Life-support lines in place. IMPRESSION: Negative noncontrast MRI head. Electronically Signed   By: Awilda Metro M.D.   On: 02/27/2015 03:48   Dg Chest Port 1 View  02/26/2015  CLINICAL DATA:  Overdose.  Intubation. EXAM: PORTABLE CHEST 1 VIEW COMPARISON:  02/25/2015 . FINDINGS: Endotracheal tube and NG tube in stable position. Cardiomegaly with normal pulmonary vascularity. Bibasilar atelectasis and/or infiltrates. No pleural effusion or pneumothorax. IMPRESSION: 1. Endotracheal tube and NG tube in stable position. 2. Bibasilar atelectasis and/or infiltrates. Electronically Signed   By: Maisie Fus  Register   On: 02/26/2015 07:48     CXR: no PNA  ASSESSMENT / PLAN:  PULMONARY A: Acute hypoxic and hypercarbic respiratory failure in the setting of possible drug overdose (multiple agents). PH 3.36, PCO2 47, and PO2 74. Hypothermia - temperature 95.4 F on admission No evidence for aspiration P:   -continue to work towards SBT's, MS a barrier will attempt to lighten sedation   CARDIOVASCULAR A: QRS prolongation QTC at risk History of hypertension P:  -tele -Hold home medication HCTZ and lisinopril  RENAL A:  Hypernatremia - resolved AKI - serum creatinine 1.0 on admission; baseline 0.6 P:   -follow BMP, replace lytes as indicated  GASTROINTESTINAL A:  GI prophylaxis P:   -Protonix 40 mg IV daily -tf to goal  HEMATOLOGIC A:  DVT prophylaxis P:  -Lovenox -cbc in am   INFECTIOUS A:  Normal white blood cell count P:    -no infiltrates, follow fever cruve  ENDOCRINE A:   Hypothyroidism P:   -Synthroid IV 25 g daily  NEUROLOGIC A:  Altered mental status secondary to possible drug overdose. CT of head without contrast did not show any acute intracranial abnormality.  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 Old?  Propofol? MG? CN 3, 4, 6? MRI Brain normal 1/13 P:   -Monitor for seizures -Fentanyl and prop, with wua -Hold home medications Cymbalta, Neurontin, nortriptyline, and zolpidem. - myasthenia Ab pending    Canary Brim, NP-C [facility] Pgr: [phone] or if no answer [phone] 02/27/2015, 12:34 PM   Attending Note:  I have examined patient, reviewed labs, studies and notes. I have discussed the case with B Ollis , and I agree with the data and plans as amended above.   Levy Pupa, MD, PhD 02/27/2015, 7:08 PM [facility] [phone] or if  no answer [phone]

## 2015-02-28 ENCOUNTER — Inpatient Hospital Stay (HOSPITAL_COMMUNITY): Payer: Medicaid Other

## 2015-02-28 DIAGNOSIS — J9601 Acute respiratory failure with hypoxia: Secondary | ICD-10-CM

## 2015-02-28 DIAGNOSIS — J9589 Other postprocedural complications and disorders of respiratory system, not elsewhere classified: Secondary | ICD-10-CM | POA: Insufficient documentation

## 2015-02-28 DIAGNOSIS — R061 Stridor: Secondary | ICD-10-CM

## 2015-02-28 LAB — BLOOD GAS, ARTERIAL
ACID-BASE EXCESS: 2.3 mmol/L — AB (ref 0.0–2.0)
Bicarbonate: 27.2 mEq/L — ABNORMAL HIGH (ref 20.0–24.0)
Drawn by: 274071
FIO2: 0.8
LHR: 14 {breaths}/min
O2 Saturation: 96.7 %
PEEP/CPAP: 5 cmH2O
PO2 ART: 92.6 mmHg (ref 80.0–100.0)
Patient temperature: 98.6
TCO2: 28.7 mmol/L (ref 0–100)
VT: 450 mL
pCO2 arterial: 48.7 mmHg — ABNORMAL HIGH (ref 35.0–45.0)
pH, Arterial: 7.365 (ref 7.350–7.450)

## 2015-02-28 LAB — GLUCOSE, CAPILLARY
GLUCOSE-CAPILLARY: 139 mg/dL — AB (ref 65–99)
GLUCOSE-CAPILLARY: 151 mg/dL — AB (ref 65–99)
GLUCOSE-CAPILLARY: 96 mg/dL (ref 65–99)
GLUCOSE-CAPILLARY: 99 mg/dL (ref 65–99)
Glucose-Capillary: 117 mg/dL — ABNORMAL HIGH (ref 65–99)
Glucose-Capillary: 138 mg/dL — ABNORMAL HIGH (ref 65–99)
Glucose-Capillary: 86 mg/dL (ref 65–99)

## 2015-02-28 LAB — CBC
HEMATOCRIT: 30 % — AB (ref 36.0–46.0)
HEMOGLOBIN: 9.8 g/dL — AB (ref 12.0–15.0)
MCH: 32.2 pg (ref 26.0–34.0)
MCHC: 32.7 g/dL (ref 30.0–36.0)
MCV: 98.7 fL (ref 78.0–100.0)
Platelets: 201 10*3/uL (ref 150–400)
RBC: 3.04 MIL/uL — ABNORMAL LOW (ref 3.87–5.11)
RDW: 15 % (ref 11.5–15.5)
WBC: 6.9 10*3/uL (ref 4.0–10.5)

## 2015-02-28 LAB — BASIC METABOLIC PANEL
Anion gap: 6 (ref 5–15)
BUN: 13 mg/dL (ref 6–20)
CHLORIDE: 103 mmol/L (ref 101–111)
CO2: 29 mmol/L (ref 22–32)
Calcium: 8.6 mg/dL — ABNORMAL LOW (ref 8.9–10.3)
Creatinine, Ser: 0.61 mg/dL (ref 0.44–1.00)
GFR calc non Af Amer: >60 mL/min (ref >60)
GLUCOSE: 104 mg/dL — AB (ref 65–99)
POTASSIUM: 3.7 mmol/L (ref 3.5–5.1)
SODIUM: 138 mmol/L (ref 135–145)

## 2015-02-28 LAB — HCV RNA QUANT: HCV QUANT: NOT DETECTED [IU]/mL (ref >50)

## 2015-02-28 LAB — TRIGLYCERIDES: TRIGLYCERIDES: 206 mg/dL — AB (ref <150)

## 2015-02-28 MED ORDER — SENNOSIDES 8.8 MG/5ML PO SYRP
5.0000 mL | ORAL_SOLUTION | Freq: Two times a day (BID) | ORAL | Status: DC | PRN
Start: 2015-02-28 — End: 2015-03-10
  Filled 2015-02-28: qty 5

## 2015-02-28 MED ORDER — MIDAZOLAM HCL 2 MG/2ML IJ SOLN: 2.0000 mg | INTRAMUSCULAR | Status: DC | PRN

## 2015-02-28 MED ORDER — ETOMIDATE 2 MG/ML IV SOLN
0.3000 mg/kg | Freq: Once | INTRAVENOUS | Status: AC
  Administered 2015-02-28: 20 mg via INTRAVENOUS

## 2015-02-28 MED ORDER — PROPOFOL 1000 MG/100ML IV EMUL
0.0000 ug/kg/min | INTRAVENOUS | Status: DC
  Administered 2015-02-28 (×3): 50 ug/kg/min via INTRAVENOUS
  Administered 2015-02-28: 60 ug/kg/min via INTRAVENOUS
  Administered 2015-03-01 (×3): 50 ug/kg/min via INTRAVENOUS
  Administered 2015-03-01: 60 ug/kg/min via INTRAVENOUS
  Filled 2015-02-28 (×8): qty 100

## 2015-02-28 MED ORDER — MIDAZOLAM HCL 2 MG/2ML IJ SOLN
2.0000 mg | INTRAMUSCULAR | Status: DC | PRN
  Administered 2015-03-01: 2 mg via INTRAVENOUS
  Filled 2015-02-28: qty 2

## 2015-02-28 MED ORDER — ANTISEPTIC ORAL RINSE SOLUTION (CORINZ)
7.0000 mL | OROMUCOSAL | Status: DC
  Administered 2015-02-28 – 2015-03-03 (×31): 7 mL via OROMUCOSAL

## 2015-02-28 MED ORDER — PANTOPRAZOLE SODIUM 40 MG PO PACK
40.0000 mg | PACK | ORAL | Status: DC
  Administered 2015-02-28 – 2015-03-01 (×2): 40 mg
  Filled 2015-02-28 (×2): qty 20

## 2015-02-28 MED ORDER — CHLORHEXIDINE GLUCONATE 0.12% ORAL RINSE (MEDLINE KIT)
15.0000 mL | Freq: Two times a day (BID) | OROMUCOSAL | Status: DC
  Administered 2015-02-28 – 2015-03-10 (×11): 15 mL via OROMUCOSAL

## 2015-02-28 MED ORDER — BISACODYL 10 MG RE SUPP: 10.0000 mg | Freq: Every day | RECTAL | Status: DC | PRN

## 2015-02-28 MED ORDER — FENTANYL BOLUS VIA INFUSION
50.0000 ug | INTRAVENOUS | Status: DC | PRN
  Filled 2015-02-28: qty 50

## 2015-02-28 MED ORDER — LEVOTHYROXINE SODIUM 50 MCG PO TABS
50.0000 ug | ORAL_TABLET | Freq: Every day | ORAL | Status: DC
  Administered 2015-03-01 – 2015-03-02 (×2): 50 ug
  Filled 2015-02-28 (×2): qty 1

## 2015-02-28 MED ORDER — SODIUM CHLORIDE 0.9 % IV SOLN
25.0000 ug/h | INTRAVENOUS | Status: DC
  Administered 2015-02-28: 100 ug/h via INTRAVENOUS
  Administered 2015-02-28: 250 ug/h via INTRAVENOUS
  Administered 2015-03-01: 200 ug/h via INTRAVENOUS
  Filled 2015-02-28 (×2): qty 50

## 2015-02-28 MED ORDER — METHYLPREDNISOLONE SODIUM SUCC 40 MG IJ SOLR
40.0000 mg | Freq: Four times a day (QID) | INTRAMUSCULAR | Status: DC
  Administered 2015-02-28 – 2015-03-03 (×12): 40 mg via INTRAVENOUS
  Filled 2015-02-28 (×12): qty 1

## 2015-02-28 MED ORDER — ACETAMINOPHEN 160 MG/5ML PO SOLN
650.0000 mg | Freq: Four times a day (QID) | ORAL | Status: DC | PRN
  Administered 2015-03-02 – 2015-03-10 (×11): 650 mg via ORAL
  Filled 2015-02-28 (×13): qty 20.3

## 2015-02-28 NOTE — Progress Notes (Signed)
Pt sats noted to be in 80's. Upon entering room, pt in tripod position, tachypneic, labored, minimal air movement on auscultation. Placed on NRB with no improvement in sats. CCM and RT paged and to bedside. Emergently intubated.

## 2015-02-28 NOTE — Progress Notes (Signed)
Pt did not have a cuff leaf.  Dr Delton CoombesByrum made aware and said to proceed with extubation. RT will continue to monitor.

## 2015-02-28 NOTE — Progress Notes (Signed)
PULMONARY / CRITICAL CARE MEDICINE   Name: Brittany [name] MRN: [id number] DOB: 03-31-1965    ADMISSION DATE:  02/24/2015 CONSULTATION DATE:  02/24/15  REFERRING MD :  Emergency Room (Dr. Linwood DibblesJon Knapp) PRIMARY SERVICE: Critical Care   CHIEF COMPLAINT:  Altered mental status  HISTORY OF PRESENT ILLNESS:  Patient is currently on vent and under sedation, history was obtained from emergency room documentation. Patient is a 50 year old female with a past medical history of hypertension, hypothyroidism, depression, bipolar disorder, anxiety, and history of IV drug use presenting OD with  10 pills from her baclofen bottle and possibly 50 pills from her nortriptyline bottle.  SUBJECTIVE:  Tolerates PSV, even on high dose propofol and fentanyl  VITAL SIGNS: Temp:  [99.7 F (37.6 C)-101.7 F (38.7 C)] 100.8 F (38.2 C) (01/15 0700) Pulse Rate:  [85-106] 98 (01/15 0700) Resp:  [14-20] 14 (01/15 0700) BP: (103-120)/(52-70) 120/67 mmHg (01/15 0200) SpO2:  [96 %-100 %] 99 % (01/15 0700) FiO2 (%):  [30 %] 30 % (01/15 0400) Weight:  [108.6 kg (239 lb 6.7 oz)] 108.6 kg (239 lb 6.7 oz) (01/15 0400) HEMODYNAMICS:   VENTILATOR SETTINGS: Vent Mode:  [-] PRVC FiO2 (%):  [30 %] 30 % Set Rate:  [14 bmp] 14 bmp Vt Set:  [450 mL] 450 mL PEEP:  [5 cmH20] 5 cmH20 Plateau Pressure:  [12 cmH20-17 cmH20] 12 cmH20 INTAKE / OUTPUT: Intake/Output      01/14 0701 - 01/15 0700 01/15 0701 - 01/16 0700   I.V. (mL/kg) 2346.7 (21.6)    NG/GT 290    Total Intake(mL/kg) 2636.7 (24.3)    Urine (mL/kg/hr) 2900 (1.1)    Total Output 2900     Net -263.3            PHYSICAL EXAMINATION: General:  Obese woman on MV Neuro: moves all ext, perr HEENT:  Pupils sluggish, Op moist, ETT  Cardiovascular:  RRR. S1 and S2  Lungs: coarse ronchi Abdomen: Soft, + bowel sounds, non-distended, no rigidity, not tender Musculoskeletal:  No edema. Skin:  Warm and dry.  LABS:  CBC  Recent Labs Lab 02/26/15 0305  02/27/15 0410 02/28/15 0410  WBC 7.8 6.0 6.9  HGB 10.6* 9.8* 9.8*  HCT 33.5* 30.4* 30.0*  PLT 205 199 201   Coag's  Recent Labs Lab 02/25/15 0320  APTT 27  INR 1.04   BMET  Recent Labs Lab 02/27/15 0006 02/27/15 1200 02/28/15 0410  NA 142 140 138  K 3.6 3.7 3.7  CL 103 103 103  CO2 30 29 29   BUN 12 11 13   CREATININE 0.68 0.63 0.61  GLUCOSE 97 99 104*   Electrolytes  Recent Labs Lab 02/24/15 2226 02/25/15 0320  02/27/15 0006 02/27/15 1200 02/28/15 0410  CALCIUM  --  9.0  < > 8.7* 8.7* 8.6*  MG 2.2 2.1  --   --   --   --   PHOS 4.1 3.6  --   --   --   --   < > = values in this interval not displayed. Sepsis Markers No results for input(s): LATICACIDVEN, PROCALCITON, O2SATVEN in the last 168 hours. ABG  Recent Labs Lab 02/25/15 0418 02/25/15 1323 02/26/15 0341  PHART 7.448 7.408 7.413  PCO2ART 43.1 50.8* 52.1*  PO2ART 99.0 82.0 70.0*   Liver Enzymes  Recent Labs Lab 02/24/15 1833 02/26/15 0305 02/27/15 0006  AST 24 37 46*  ALT 34 30 36  ALKPHOS 81 65 70  BILITOT 0.7 0.6 0.8  ALBUMIN  4.6 3.3* 3.0*   Cardiac Enzymes No results for input(s): TROPONINI, PROBNP in the last 168 hours. Glucose  Recent Labs Lab 02/27/15 0800 02/27/15 1123 02/27/15 1700 02/27/15 2011 02/27/15 2353 02/28/15 0407  GLUCAP 90 104* 93 81 86 96    Imaging Mr Brain Wo Contrast  02/27/2015  CLINICAL DATA:  Drug overdose. History of hypertension, IV drug use, migraine, facial nerve injury. EXAM: MRI HEAD WITHOUT CONTRAST TECHNIQUE: Multiplanar, multiecho pulse sequences of the brain and surrounding structures were obtained without intravenous contrast. COMPARISON:  CT head February 24, 2015 FINDINGS: The ventricles and sulci are normal for patient's age. No abnormal parenchymal signal, mass lesions, mass effect. No reduced diffusion to suggest acute ischemia. No susceptibility artifact to suggest hemorrhage. No abnormal extra-axial fluid collections. No extra-axial  masses though, contrast enhanced sequences would be more sensitive. Normal major intracranial vascular flow voids seen at the skull base. Ocular globes and orbital contents are unremarkable though not tailored for evaluation. No abnormal sellar expansion. No suspicious calvarial bone marrow signal. Craniocervical junction maintained. Mild paranasal sinus mucosal thickening, sphenoid sinus air fluid levels. Life-support lines in place. IMPRESSION: Negative noncontrast MRI head. Electronically Signed   By: Awilda Metro M.D.   On: 02/27/2015 03:48     CXR: no PNA  ASSESSMENT / PLAN:  PULMONARY A: Acute hypoxic and hypercarbic respiratory failure in the setting of possible drug overdose (multiple agents). PH 3.36, PCO2 47, and PO2 74. Hypothermia - temperature 95.4 F on admission No evidence for aspiration P:   -continue to work towards SBT's, MS a barrier will attempt to lighten sedation 1/15   CARDIOVASCULAR A: QRS prolongation History of hypertension P:  -tele -Hold home medication HCTZ and lisinopril, restart as sedation lifted and BP rebounds  RENAL A:  Hypernatremia - resolved AKI - serum creatinine 1.0 on admission; baseline 0.6 P:   -follow BMP, replace lytes as indicated  GASTROINTESTINAL A:  GI prophylaxis P:   -Protonix 40 mg IV daily -tf to goal  HEMATOLOGIC A:  DVT prophylaxis P:  -Lovenox -cbc in am   INFECTIOUS A:  Normal white blood cell count P:   -no infiltrates, follow fever cruve  ENDOCRINE A:   Hypothyroidism P:   -Synthroid IV 25 g daily  NEUROLOGIC A:  Altered mental status secondary to possible drug overdose. CT of head without contrast did not show any acute intracranial abnormality.  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 Old?  Propofol? MG? CN 3, 4, 6? MRI Brain normal 1/13 P:   -Monitor for seizures, none noted -Fentanyl and propofol need to be significantly decreased -Hold home medications Cymbalta, Neurontin, nortriptyline, and  zolpidem. - myasthenia Ab pending  Independent CC time 35 minutes  Levy Pupa, MD, PhD 02/28/2015, 7:58 AM [facility] [phone] or if no answer [phone]

## 2015-02-28 NOTE — Progress Notes (Signed)
Wasted 240mL Fentanyl gtt in sink with Heloise Beechamrystal Kerrilyn Azbill, RN.

## 2015-02-28 NOTE — Procedures (Signed)
Intubation Procedure Note [name] [id number] 1965/09/30  Procedure: Intubation Indications: Respiratory insufficiency / stridor.   Procedure Details Consent: Unable to obtain consent because of emergent medical necessity.   Time Out: Verified patient identification, verified procedure, site/side was marked, verified correct patient position, special equipment/implants available, medications/allergies/relevent history reviewed, required imaging and test results available.  Performed  Maximum sterile technique was used including antiseptics, cap, gloves, gown, hand hygiene, mask and sheet.  MAC and 3    Evaluation Hemodynamic Status: BP stable throughout; O2 sats: Hypoxemia prior to intubation, sats in high 70's, low 80's.  Unchanged during intubation process.  Patient's Current Condition: stable Complications: No apparent complications Patient did tolerate procedure well. Chest X-ray ordered to verify placement.  CXR: pending.   Examined patient at bedside.  Sats in high 70's/low 80's on arrival.  Upper airway wheezing, minimal air movement prior to intubation. Upon visualization, cords closed with minimal movement during inspiratory / expiratory phase.  ETT passed under direct visualization with glide-scope.    Procedure performed under direct supervision of Dr. Craige CottaSood.     Canary BrimBrandi Ollis, NP-C [facility] Pgr: [phone] or if no answer [phone] 02/28/2015, 11:15 AM

## 2015-03-01 ENCOUNTER — Inpatient Hospital Stay (HOSPITAL_COMMUNITY): Payer: Medicaid Other

## 2015-03-01 LAB — GLUCOSE, CAPILLARY
GLUCOSE-CAPILLARY: 154 mg/dL — AB (ref 65–99)
GLUCOSE-CAPILLARY: 139 mg/dL — AB (ref 65–99)
Glucose-Capillary: 140 mg/dL — ABNORMAL HIGH (ref 65–99)
Glucose-Capillary: 158 mg/dL — ABNORMAL HIGH (ref 65–99)
Glucose-Capillary: 152 mg/dL — ABNORMAL HIGH (ref 65–99)

## 2015-03-01 LAB — BASIC METABOLIC PANEL
ANION GAP: 7 (ref 5–15)
BUN: 12 mg/dL (ref 6–20)
CALCIUM: 8.9 mg/dL (ref 8.9–10.3)
CO2: 28 mmol/L (ref 22–32)
CREATININE: 0.53 mg/dL (ref 0.44–1.00)
Chloride: 103 mmol/L (ref 101–111)
Glucose, Bld: 157 mg/dL — ABNORMAL HIGH (ref 65–99)
Potassium: 4.4 mmol/L (ref 3.5–5.1)
SODIUM: 138 mmol/L (ref 135–145)

## 2015-03-01 LAB — CBC
HEMATOCRIT: 32.5 % — AB (ref 36.0–46.0)
Hemoglobin: 10.6 g/dL — ABNORMAL LOW (ref 12.0–15.0)
MCH: 31.4 pg (ref 26.0–34.0)
MCHC: 32.6 g/dL (ref 30.0–36.0)
MCV: 96.2 fL (ref 78.0–100.0)
PLATELETS: 244 10*3/uL (ref 150–400)
RBC: 3.38 MIL/uL — ABNORMAL LOW (ref 3.87–5.11)
RDW: 13.6 % (ref 11.5–15.5)
WBC: 10.6 10*3/uL — AB (ref 4.0–10.5)

## 2015-03-01 LAB — MAGNESIUM: MAGNESIUM: 2 mg/dL (ref 1.7–2.4)

## 2015-03-01 LAB — PHOSPHORUS: PHOSPHORUS: 3.6 mg/dL (ref 2.5–4.6)

## 2015-03-01 MED ORDER — DULOXETINE HCL 30 MG PO CPEP
30.0000 mg | ORAL_CAPSULE | Freq: Every day | ORAL | Status: DC
  Administered 2015-03-02 – 2015-03-10 (×9): 30 mg via ORAL
  Filled 2015-03-01 (×9): qty 1

## 2015-03-01 MED ORDER — DEXMEDETOMIDINE HCL IN NACL 400 MCG/100ML IV SOLN
0.4000 ug/kg/h | INTRAVENOUS | Status: DC
  Administered 2015-03-01: 1.2 ug/kg/h via INTRAVENOUS
  Administered 2015-03-01: 0.6 ug/kg/h via INTRAVENOUS
  Administered 2015-03-01: 1.2 ug/kg/h via INTRAVENOUS
  Administered 2015-03-01: 0.8 ug/kg/h via INTRAVENOUS
  Administered 2015-03-01: 1.2 ug/kg/h via INTRAVENOUS
  Administered 2015-03-01: 0.8 ug/kg/h via INTRAVENOUS
  Administered 2015-03-01: 1.2 ug/kg/h via INTRAVENOUS
  Administered 2015-03-02: 0.4 ug/kg/h via INTRAVENOUS
  Administered 2015-03-02: 1.2 ug/kg/h via INTRAVENOUS
  Filled 2015-03-01 (×2): qty 50
  Filled 2015-03-01 (×2): qty 100
  Filled 2015-03-01: qty 50
  Filled 2015-03-01 (×5): qty 100
  Filled 2015-03-01: qty 50

## 2015-03-01 NOTE — Clinical Social Work Psych Note (Signed)
Psych CSW continuing to follow.  Will initiate official psych consult once patient is extubated.  Vickii PennaGina Elgin Carn, LCSW [phone]  5N1-9; 2S 15-16 and [facility] Line Licensed Clinical Social Worker

## 2015-03-01 NOTE — Progress Notes (Signed)
PULMONARY / CRITICAL CARE MEDICINE   Name: Brittany [name] MRN: [id number] DOB: [birth year]    ADMISSION DATE:  02/24/2015 CONSULTATION DATE:  02/24/15  REFERRING MD :  Emergency Room (Dr. Linwood DibblesJon Knapp) PRIMARY SERVICE: Critical Care   CHIEF COMPLAINT:  Altered mental status  HISTORY OF PRESENT ILLNESS:  Patient is currently on vent and under sedation, history was obtained from emergency room documentation. Patient is a 50 year old female with a past medical history of hypertension, hypothyroidism, depression, bipolar disorder, anxiety, and history of IV drug use presenting OD with  10 pills from her baclofen bottle and possibly 50 pills from her nortriptyline bottle.  SUBJECTIVE:  Tolerates PSV, even on high dose propofol and fentanyl  VITAL SIGNS: Temp:  [98.1 F (36.7 C)-100.4 F (38 C)] 98.6 F (37 C) (01/16 1000) Pulse Rate:  [57-111] 76 (01/16 1000) Resp:  [13-27] 14 (01/16 1000) BP: (94-166)/(47-112) 111/65 mmHg (01/16 1000) SpO2:  [75 %-99 %] 96 % (01/16 1000) FiO2 (%):  [0.4 %-50 %] 0.4 % (01/16 0800) Weight:  [108.2 kg (238 lb 8.6 oz)] 108.2 kg (238 lb 8.6 oz) (01/16 0306) HEMODYNAMICS:   VENTILATOR SETTINGS: Vent Mode:  [-] PRVC FiO2 (%):  [0.4 %-50 %] 0.4 % Set Rate:  [14 bmp] 14 bmp Vt Set:  [450 mL] 450 mL PEEP:  [5 cmH20] 5 cmH20 Plateau Pressure:  [13 cmH20-22 cmH20] 13 cmH20 INTAKE / OUTPUT: Intake/Output      01/15 0701 - 01/16 0700 01/16 0701 - 01/17 0700   I.V. (mL/kg) 2352.1 (21.7) 307.8 (2.8)   Other 30    NG/GT 210 30   Total Intake(mL/kg) 2592.1 (24) 337.8 (3.1)   Urine (mL/kg/hr) 2425 (0.9)    Total Output 2425     Net +167.1 +337.8          PHYSICAL EXAMINATION: General:  Obese woman on MV arousable and follows commands. Neuro: Moves all ext to command. HEENT:  Pupils sluggish, Op moist, ETT, left eye deviated, chronic. Cardiovascular:  RRR. Nl S1/S2, -M/R/G. Lungs: coarse ronchi Abdomen: Soft, + bowel sounds, non-distended, no rigidity,  not tender Musculoskeletal:  No edema. Skin:  Warm and dry.  LABS:  CBC  Recent Labs Lab 02/27/15 0410 02/28/15 0410 03/01/15 0316  WBC 6.0 6.9 10.6*  HGB 9.8* 9.8* 10.6*  HCT 30.4* 30.0* 32.5*  PLT 199 201 244   Coag's  Recent Labs Lab 02/25/15 0320  APTT 27  INR 1.04   BMET  Recent Labs Lab 02/27/15 1200 02/28/15 0410 03/01/15 0316  NA 140 138 138  K 3.7 3.7 4.4  CL 103 103 103  CO2 29 29 28   BUN 11 13 12   CREATININE 0.63 0.61 0.53  GLUCOSE 99 104* 157*   Electrolytes  Recent Labs Lab 02/24/15 2226 02/25/15 0320  02/27/15 1200 02/28/15 0410 03/01/15 0316  CALCIUM  --  9.0  < > 8.7* 8.6* 8.9  MG 2.2 2.1  --   --   --  2.0  PHOS 4.1 3.6  --   --   --  3.6  < > = values in this interval not displayed.   Sepsis Markers No results for input(s): LATICACIDVEN, PROCALCITON, O2SATVEN in the last 168 hours.  ABG  Recent Labs Lab 02/25/15 1323 02/26/15 0341 02/28/15 1225  PHART 7.408 7.413 7.365  PCO2ART 50.8* 52.1* 48.7*  PO2ART 82.0 70.0* 92.6   Liver Enzymes  Recent Labs Lab 02/24/15 1833 02/26/15 0305 02/27/15 0006  AST 24 37 46*  ALT 34  30 36  ALKPHOS 81 65 70  BILITOT 0.7 0.6 0.8  ALBUMIN 4.6 3.3* 3.0*   Cardiac Enzymes No results for input(s): TROPONINI, PROBNP in the last 168 hours. Glucose  Recent Labs Lab 02/28/15 1157 02/28/15 1606 02/28/15 1929 02/28/15 2332 03/01/15 0333 03/01/15 0814  GLUCAP 117* 138* 139* 151* 140* 158*   Imaging Dg Chest Port 1 View  03/01/2015  CLINICAL DATA:  Respiratory failure. EXAM: PORTABLE CHEST 1 VIEW COMPARISON:  02/28/2015 . FINDINGS: Endotracheal tube in stable position. Mediastinum hilar structures normal. Low lung volumes with mild bibasilar atelectasis and/or infiltrates. Small left pleural effusion cannot be excluded. No pneumothorax. IMPRESSION: 1. Endotracheal tube in stable position. 2. Low lung volumes with mild bibasilar atelectasis and/or infiltrates. Small left pleural  effusion cannot be excluded . Electronically Signed   By: Maisie Fus  Register   On: 03/01/2015 07:16   Dg Chest Port 1 View  02/28/2015  CLINICAL DATA:  Intubation, history hypertension EXAM: PORTABLE CHEST 1 VIEW COMPARISON:  Portable exam 1119 hours compared to 02/28/2015 FINDINGS: Tip of endotracheal tube is likely at the carina directed into RIGHT mainstem bronchus, though the carina is not well visualized on the current exam; recommend withdrawal 2.5 cm. Upper normal heart size. Normal mediastinal contours. New diffuse RIGHT lung opacities and LEFT lower lobe opacity question multifocal pneumonia. No gross pleural effusion or pneumothorax. Minimal LEFT basilar atelectasis. Bones unremarkable. IMPRESSION: Tip of endotracheal tube is likely at the carina directed into the RIGHT mainstem bronchus; recommend withdrawal 2.5 cm. New RIGHT lung infiltrates and suspected LEFT lower lobe infiltrate suspicious for multifocal pneumonia. Findings called to Traci RN on 2 Heart on 02/28/2015 at 1216 hours ; tube has already been repositioned. Electronically Signed   By: Ulyses Southward M.D.   On: 02/28/2015 12:17   Dg Chest Port 1 View  02/28/2015  CLINICAL DATA:  Acute respiratory failure.  On ventilator. EXAM: PORTABLE CHEST 1 VIEW COMPARISON:  02/26/2015 FINDINGS: Endotracheal tube and nasogastric tube are in appropriate position. Bibasilar subsegmental atelectasis is seen which shows improvement in the left lung base since previous study. No evidence of pulmonary consolidation or pleural effusion. Heart size is within normal limits. No pneumothorax visualized. IMPRESSION: Bibasilar subsegmental atelectasis, with interval improvement seen in left lung base. Electronically Signed   By: Myles Rosenthal M.D.   On: 02/28/2015 08:35   Dg Abd Portable 1v  02/28/2015  CLINICAL DATA:  Orogastric tube placement. EXAM: PORTABLE ABDOMEN - 1 VIEW COMPARISON:  Lumbar spine radiographs 03/13/2012. Chest radiographs today. FINDINGS: 1308  hours. Orogastric tube tip is looped in the left upper quadrant of the abdomen, likely in the proximal stomach. The visualized bowel gas pattern is nonobstructive. There is hypoaeration of the lung bases with asymmetric density on the right which could reflect a right pleural effusion. IMPRESSION: Nasogastric tube tip in the left upper quadrant of the abdomen, likely in the proximal stomach. Electronically Signed   By: Carey Bullocks M.D.   On: 02/28/2015 14:33     CXR: I reviewed myself, no infiltrate, ETT ok.  ASSESSMENT / PLAN:  PULMONARY A: Acute hypoxic and hypercarbic respiratory failure in the setting of possible drug overdose (multiple agents). PH 3.36, PCO2 47, and PO2 74. Hypothermia - temperature 95.4 F on admission No evidence for aspiration P:   - Begin PS trials but no extubation given yesterday's events. - Change from propofol/fentanyl to precedex then reassess mental status. - Titrate O2 for sat of 88-92%.  CARDIOVASCULAR A: QRS  prolongation History of hypertension P:  - Tele - Hold home medication HCTZ and lisinopril, restart as sedation lifted and BP rebounds  RENAL A:  Hypernatremia - resolved AKI - serum creatinine 1.0 on admission; baseline 0.6 P:   - Follow BMP - Replace lytes as indicated - KVO IVF.  GASTROINTESTINAL A:  GI prophylaxis P:   - Protonix 40 mg IV daily - TF to goal  HEMATOLOGIC A:  DVT prophylaxis P:  - Lovenox - CBC in am  - Transfuse per ICU protocol.   INFECTIOUS A:  Normal white blood cell count P:   - No infiltrates, follow fever curve - Hold abx.  ENDOCRINE A:   Hypothyroidism P:   - Synthroid IV 25 g daily - Steroids for stridor, will continue for one more day.  NEUROLOGIC A:  Altered mental status secondary to possible drug overdose. CT of head without contrast did not show any acute intracranial abnormality.  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 Old?  Propofol? MG? CN 3, 4, 6? MRI Brain normal 1/13 P:   -  Monitor for seizures, none noted - D/C Fentanyl and propofol - Precedex ordered. - Hold home medications Neurontin, Nortriptyline, and zolpidem. - Restart cymbalta. - Myasthenia Ab pending  The patient is critically ill with multiple organ systems failure and requires high complexity decision making for assessment and support, frequent evaluation and titration of therapies, application of advanced monitoring technologies and extensive interpretation of multiple databases.   Critical Care Time devoted to patient care services described in this note is  35  Minutes. This time reflects time of care of this signee Dr Koren Bound. This critical care time does not reflect procedure time, or teaching time or supervisory time of PA/NP/Med student/Med Resident etc but could involve care discussion time.  Alyson Reedy, M.D. [facility]. Pager: [phone]. After hours pager: [phone].

## 2015-03-02 ENCOUNTER — Inpatient Hospital Stay (HOSPITAL_COMMUNITY): Payer: Medicaid Other

## 2015-03-02 LAB — BLOOD GAS, ARTERIAL
ACID-BASE EXCESS: 3.5 mmol/L — AB (ref 0.0–2.0)
BICARBONATE: 27.4 meq/L — AB (ref 20.0–24.0)
Drawn by: 40415
FIO2: 0.4
LHR: 14 {breaths}/min
O2 Saturation: 97.6 %
PEEP/CPAP: 5 cmH2O
PH ART: 7.441 (ref 7.350–7.450)
PO2 ART: 103 mmHg — AB (ref 80.0–100.0)
Patient temperature: 98.6
TCO2: 28.7 mmol/L (ref 0–100)
VT: 450 mL
pCO2 arterial: 40.9 mmHg (ref 35.0–45.0)

## 2015-03-02 LAB — BASIC METABOLIC PANEL
Anion gap: 10 (ref 5–15)
BUN: 28 mg/dL — ABNORMAL HIGH (ref 6–20)
CHLORIDE: 104 mmol/L (ref 101–111)
CO2: 29 mmol/L (ref 22–32)
Calcium: 9.4 mg/dL (ref 8.9–10.3)
Creatinine, Ser: 0.62 mg/dL (ref 0.44–1.00)
GFR calc non Af Amer: >60 mL/min (ref >60)
Glucose, Bld: 149 mg/dL — ABNORMAL HIGH (ref 65–99)
POTASSIUM: 4.2 mmol/L (ref 3.5–5.1)
SODIUM: 143 mmol/L (ref 135–145)

## 2015-03-02 LAB — GLUCOSE, CAPILLARY
GLUCOSE-CAPILLARY: 150 mg/dL — AB (ref 65–99)
GLUCOSE-CAPILLARY: 145 mg/dL — AB (ref 65–99)
GLUCOSE-CAPILLARY: 152 mg/dL — AB (ref 65–99)
Glucose-Capillary: 153 mg/dL — ABNORMAL HIGH (ref 65–99)
Glucose-Capillary: 108 mg/dL — ABNORMAL HIGH (ref 65–99)
Glucose-Capillary: 131 mg/dL — ABNORMAL HIGH (ref 65–99)
Glucose-Capillary: 128 mg/dL — ABNORMAL HIGH (ref 65–99)

## 2015-03-02 LAB — URINALYSIS, ROUTINE W REFLEX MICROSCOPIC
BILIRUBIN URINE: NEGATIVE
Glucose, UA: NEGATIVE mg/dL
Hgb urine dipstick: NEGATIVE
Ketones, ur: NEGATIVE mg/dL
NITRITE: NEGATIVE
PH: 7.5 (ref 5.0–8.0)
Protein, ur: NEGATIVE mg/dL
SPECIFIC GRAVITY, URINE: 1.021 (ref 1.005–1.030)

## 2015-03-02 LAB — CBC
HEMATOCRIT: 35.2 % — AB (ref 36.0–46.0)
HEMOGLOBIN: 11.6 g/dL — AB (ref 12.0–15.0)
MCH: 32 pg (ref 26.0–34.0)
MCHC: 33 g/dL (ref 30.0–36.0)
MCV: 97.2 fL (ref 78.0–100.0)
Platelets: 313 10*3/uL (ref 150–400)
RBC: 3.62 MIL/uL — AB (ref 3.87–5.11)
RDW: 13.5 % (ref 11.5–15.5)
WBC: 13 10*3/uL — ABNORMAL HIGH (ref 4.0–10.5)

## 2015-03-02 LAB — URINE MICROSCOPIC-ADD ON

## 2015-03-02 LAB — PHOSPHORUS: PHOSPHORUS: 3.1 mg/dL (ref 2.5–4.6)

## 2015-03-02 LAB — MAGNESIUM: MAGNESIUM: 2.5 mg/dL — AB (ref 1.7–2.4)

## 2015-03-02 LAB — ACETYLCHOLINE RECEPTOR, BINDING: ACETYLCHOLINE BINDING AB: 0.09 nmol/L (ref 0.00–0.24)

## 2015-03-02 MED ORDER — PANTOPRAZOLE SODIUM 40 MG PO TBEC
40.0000 mg | DELAYED_RELEASE_TABLET | Freq: Every day | ORAL | Status: DC
  Filled 2015-03-02: qty 1

## 2015-03-02 MED ORDER — SODIUM CHLORIDE 0.9 % IV SOLN
INTRAVENOUS | Status: AC
  Administered 2015-03-02: 10:00:00 via INTRAVENOUS

## 2015-03-02 NOTE — Progress Notes (Signed)
"  fiance"  John called with correct code word for update. Update given by RN.

## 2015-03-02 NOTE — Progress Notes (Signed)
   03/02/15 1147  Vent [facility]  Vent end date 03/02/15  Vent end time 1147  Patient and order verified.  Sedonia Small RN present at bedside.  Extubated patient to 35% aerosol face tent.  HR 76, RR 18, Saturation 97%.  Patient said her name and is adequately clearing secretions at this time.

## 2015-03-02 NOTE — Progress Notes (Signed)
PULMONARY / CRITICAL CARE MEDICINE   Name: Brittany [name] MRN: [id number] DOB: 04/26/1965    ADMISSION DATE:  02/24/2015 CONSULTATION DATE:  02/24/15  REFERRING MD :  Emergency Room (Dr. Linwood Dibbles) PRIMARY SERVICE: Critical Care   CHIEF COMPLAINT:  Altered mental status  HISTORY OF PRESENT ILLNESS:  Patient is currently on vent and under sedation, history was obtained from emergency room documentation. Patient is a 50 year old female with a past medical history of hypertension, hypothyroidism, depression, bipolar disorder, anxiety, and history of IV drug use presenting OD with 10 pills from her baclofen bottle and possibly 50 pills from her nortriptyline bottle.  SUBJECTIVE:   Mild fever overnight, but doing very well on PS this AM. Follows commands, appears quite calm on the vent.   VITAL SIGNS: Temp:  [98.4 F (36.9 C)-100.4 F (38 C)] 100.2 F (37.9 C) (01/17 0700) Pulse Rate:  [61-96] 69 (01/17 0700) Resp:  [11-19] 16 (01/17 0700) BP: (111-154)/(64-107) 150/90 mmHg (01/17 0700) SpO2:  [95 %-100 %] 99 % (01/17 0700) FiO2 (%):  [40 %] 40 % (01/17 0432) Weight:  [235 lb 14.3 oz (107 kg)] 235 lb 14.3 oz (107 kg) (01/17 0148)   HEMODYNAMICS:   VENTILATOR SETTINGS: Vent Mode:  [-] PRVC FiO2 (%):  [40 %] 40 % Set Rate:  [14 bmp] 14 bmp Vt Set:  [450 mL] 450 mL PEEP:  [5 cmH20] 5 cmH20 Plateau Pressure:  [12 cmH20-16 cmH20] 16 cmH20   INTAKE / OUTPUT: Intake/Output      01/16 0701 - 01/17 0700 01/17 0701 - 01/18 0700   I.V. (mL/kg) 1043.9 (9.8)    Other     NG/GT 230    Total Intake(mL/kg) 1273.9 (11.9)    Urine (mL/kg/hr) 3150 (1.2)    Total Output 3150     Net -1876.1            PHYSICAL EXAMINATION: General:  Obese woman on vent, alert, no distress.  Neuro: PERRL, follows commands, moves all extremities spontaneously.  HEENT: Dysconjugate gaze, ETT/OGT in place. Moist mucus membranes Cardiovascular:  RRR, no murmurs, gallops, or rubs.  Lungs: Breath sounds  equal, coarse breath sounds, no wheezes or rales.  Abdomen: Soft, non-tender, non-distended. BS + Musculoskeletal:  No edema Skin:  Warm and dry  LABS:  CBC  Recent Labs Lab 02/28/15 0410 03/01/15 0316 03/02/15 0340  WBC 6.9 10.6* 13.0*  HGB 9.8* 10.6* 11.6*  HCT 30.0* 32.5* 35.2*  PLT 201 244 313   Coag's  Recent Labs Lab 02/25/15 0320  APTT 27  INR 1.04   BMET  Recent Labs Lab 02/28/15 0410 03/01/15 0316 03/02/15 0340  NA 138 138 143  K 3.7 4.4 4.2  CL 103 103 104  CO2 BUN 13 12 28*  CREATININE 0.61 0.53 0.62  GLUCOSE 104* 157* 149*   Electrolytes  Recent Labs Lab 02/25/15 0320  02/28/15 0410 03/01/15 0316 03/02/15 0340  CALCIUM 9.0  < > 8.6* 8.9 9.4  MG 2.1  --   --  2.0 2.5*  PHOS 3.6  --   --  3.6 3.1  < > = values in this interval not displayed.    ABG  Recent Labs Lab 02/26/15 0341 02/28/15 1225 03/02/15 0509  PHART 7.413 7.365 7.441  PCO2ART 52.1* 48.7* 40.9  PO2ART 70.0* 92.6 103*   Liver Enzymes  Recent Labs Lab 02/24/15 1833 02/26/15 0305 02/27/15 0006  AST 24 37 46*  ALT 34 30 36  ALKPHOS  81 65 70  BILITOT 0.7 0.6 0.8  ALBUMIN 4.6 3.3* 3.0*    Glucose  Recent Labs Lab 03/01/15 0814 03/01/15 1105 03/01/15 1614 03/01/15 1958 03/02/15 0118 03/02/15 0420  GLUCAP 158* 154* 139* 152* 150* 145*   Imaging Dg Chest Port 1 View  03/02/2015  CLINICAL DATA:  Endotracheal tube EXAM: PORTABLE CHEST 1 VIEW COMPARISON:  Yesterday FINDINGS: Endotracheal tube tip is 15 mm above the carina, different appearance from prior likely from patient positioning. The orogastric tube reaches the stomach at least. Low volumes with streaky basilar opacities, improved with better visualization of the central diaphragm. No edema, effusion, or air leak. Borderline cardiomegaly. Stable aortic contours. IMPRESSION: 1. Similar positioning of endotracheal and orogastric tubes. 2. Improving basilar atelectasis. Electronically Signed   By:  Marnee Spring M.D.   On: 03/02/2015 07:40   Dg Chest Port 1 View  03/01/2015  CLINICAL DATA:  Respiratory failure. EXAM: PORTABLE CHEST 1 VIEW COMPARISON:  02/28/2015 . FINDINGS: Endotracheal tube in stable position. Mediastinum hilar structures normal. Low lung volumes with mild bibasilar atelectasis and/or infiltrates. Small left pleural effusion cannot be excluded. No pneumothorax. IMPRESSION: 1. Endotracheal tube in stable position. 2. Low lung volumes with mild bibasilar atelectasis and/or infiltrates. Small left pleural effusion cannot be excluded . Electronically Signed   By: Maisie Fus  Register   On: 03/01/2015 07:16   Dg Chest Port 1 View  02/28/2015  CLINICAL DATA:  Intubation, history hypertension EXAM: PORTABLE CHEST 1 VIEW COMPARISON:  Portable exam 1119 hours compared to 02/28/2015 FINDINGS: Tip of endotracheal tube is likely at the carina directed into RIGHT mainstem bronchus, though the carina is not well visualized on the current exam; recommend withdrawal 2.5 cm. Upper normal heart size. Normal mediastinal contours. New diffuse RIGHT lung opacities and LEFT lower lobe opacity question multifocal pneumonia. No gross pleural effusion or pneumothorax. Minimal LEFT basilar atelectasis. Bones unremarkable. IMPRESSION: Tip of endotracheal tube is likely at the carina directed into the RIGHT mainstem bronchus; recommend withdrawal 2.5 cm. New RIGHT lung infiltrates and suspected LEFT lower lobe infiltrate suspicious for multifocal pneumonia. Findings called to Traci RN on 2 Heart on 02/28/2015 at 1216 hours ; tube has already been repositioned. Electronically Signed   By: Ulyses Southward M.D.   On: 02/28/2015 12:17   Dg Abd Portable 1v  02/28/2015  CLINICAL DATA:  Orogastric tube placement. EXAM: PORTABLE ABDOMEN - 1 VIEW COMPARISON:  Lumbar spine radiographs 03/13/2012. Chest radiographs today. FINDINGS: 1308 hours. Orogastric tube tip is looped in the left upper quadrant of the abdomen, likely in  the proximal stomach. The visualized bowel gas pattern is nonobstructive. There is hypoaeration of the lung bases with asymmetric density on the right which could reflect a right pleural effusion. IMPRESSION: Nasogastric tube tip in the left upper quadrant of the abdomen, likely in the proximal stomach. Electronically Signed   By: Carey Bullocks M.D.   On: 02/28/2015 14:33     ASSESSMENT / PLAN:  PULMONARY A:  Acute hypoxic/hypercarbic respiratory failure 2/2 drug overdose (multiple agents) Hypothermia; resolved. Now with fever Basilar Atelectasis P:   Daily SBT Titrate FiO2/PEEP for SpO2 88-92% Trial extubation today. Discontinue Steroids if no stridor post extubation.  CARDIOVASCULAR A:  QRS prolongation H/o HTN; BP slightly elevated this AM P:  Continue Tele May restart home BP meds in AM  RENAL A:   AKI; resolved Elevated BUN today; may be slightly dry vs 2/2 steroids P:   Repeat BMP in AM Discontinue steroids  Gentle hydration; NS @ 100 for 12 hrs  GASTROINTESTINAL A:   GI PPx P:   Continue Protonix  Continue tube feeds  HEMATOLOGIC A:   Mild Leukocytosis Normocytic Anemia DVT PPx P:  Lovenox [place] Repeat CBC in AM  INFECTIOUS A:   Mild Leukocytosis Low Grade Fever P:   Repeat CXR in AM Hold off on antibiotics for now Repeat blood/urine cultures  ENDOCRINE A:    Hypothyroidism P:   Continue Synthroid IV 25 g daily Discontinue steroids  NEUROLOGIC A:   Acute Encephalopathy 2/2 Drug Overdose Dysconjugate left eye; normal neuro imaging; MG? P:   D/C Precedex Hold home medications; Neurontin, Nortriptyline, and Ambien Continue Cymbalta Myasthenia Ab pending  Lauris Chroman, MD PGY-3, Internal Medicine Pager: [phone]  Attending Note  50 year old female with OD as above that was intubated for airway protection.  Extubated then developed stridor and reintubated.  Started on steroids.  This AM is tolerating PS well.  On exam, clear to  auscultation.  Will trial extubate her today.  If no stridor then will d/c steroids.  In the meantime, post extubation will call psych and start suicide precautions.  Continue to hold neurontin, nortripyline and ambien for now.  D/C precedex.  Swallow evaluation and ambulation.  The patient is critically ill with multiple organ systems failure and requires high complexity decision making for assessment and support, frequent evaluation and titration of therapies, application of advanced monitoring technologies and extensive interpretation of multiple databases.   Critical Care Time devoted to patient care services described in this note is  35  Minutes. This time reflects time of care of this signee Dr Koren Bound. This critical care time does not reflect procedure time, or teaching time or supervisory time of PA/NP/Med student/Med Resident etc but could involve care discussion time.  Alyson Reedy, M.D. [facility] Pulmonary/Critical Care Medicine. Pager: [phone]. After hours pager: [phone].

## 2015-03-03 ENCOUNTER — Inpatient Hospital Stay (HOSPITAL_COMMUNITY): Payer: Medicaid Other

## 2015-03-03 LAB — CBC WITH DIFFERENTIAL/PLATELET
BASOS ABS: 0.1 10*3/uL (ref 0.0–0.1)
BASOS PCT: 0 %
EOS ABS: 0 10*3/uL (ref 0.0–0.7)
EOS PCT: 0 %
HEMATOCRIT: 37.7 % (ref 36.0–46.0)
Hemoglobin: 12.4 g/dL (ref 12.0–15.0)
LYMPHS ABS: 1.4 10*3/uL (ref 0.7–4.0)
LYMPHS PCT: 11 %
MCH: 32.1 pg (ref 26.0–34.0)
MCHC: 32.9 g/dL (ref 30.0–36.0)
MCV: 97.7 fL (ref 78.0–100.0)
MONO ABS: 0.8 10*3/uL (ref 0.1–1.0)
Monocytes Relative: 6 %
Neutro Abs: 10.1 10*3/uL — ABNORMAL HIGH (ref 1.7–7.7)
Neutrophils Relative %: 82 %
PLATELETS: 329 10*3/uL (ref 150–400)
RBC: 3.86 MIL/uL — ABNORMAL LOW (ref 3.87–5.11)
RDW: 13.6 % (ref 11.5–15.5)
WBC: 12.3 10*3/uL — ABNORMAL HIGH (ref 4.0–10.5)

## 2015-03-03 LAB — CULTURE, BLOOD (ROUTINE X 2): Culture: NO GROWTH

## 2015-03-03 LAB — BASIC METABOLIC PANEL
Anion gap: 13 (ref 5–15)
BUN: 22 mg/dL — ABNORMAL HIGH (ref 6–20)
CO2: 25 mmol/L (ref 22–32)
Calcium: 9.5 mg/dL (ref 8.9–10.3)
Chloride: 106 mmol/L (ref 101–111)
Creatinine, Ser: 0.55 mg/dL (ref 0.44–1.00)
GFR calc Af Amer: >60 mL/min (ref >60)
GLUCOSE: 119 mg/dL — AB (ref 65–99)
POTASSIUM: 3.8 mmol/L (ref 3.5–5.1)
Sodium: 144 mmol/L (ref 135–145)

## 2015-03-03 LAB — TRIGLYCERIDES: Triglycerides: 292 mg/dL — ABNORMAL HIGH (ref <150)

## 2015-03-03 LAB — GLUCOSE, CAPILLARY
GLUCOSE-CAPILLARY: 113 mg/dL — AB (ref 65–99)
GLUCOSE-CAPILLARY: 106 mg/dL — AB (ref 65–99)

## 2015-03-03 LAB — MAGNESIUM: MAGNESIUM: 2.3 mg/dL (ref 1.7–2.4)

## 2015-03-03 LAB — PHOSPHORUS: PHOSPHORUS: 3.7 mg/dL (ref 2.5–4.6)

## 2015-03-03 MED ORDER — VANCOMYCIN HCL IN DEXTROSE 1-5 GM/200ML-% IV SOLN
1000.0000 mg | Freq: Three times a day (TID) | INTRAVENOUS | Status: DC
  Administered 2015-03-04 – 2015-03-10 (×17): 1000 mg via INTRAVENOUS
  Filled 2015-03-03 (×24): qty 200

## 2015-03-03 MED ORDER — LISINOPRIL 10 MG PO TABS
10.0000 mg | ORAL_TABLET | Freq: Every day | ORAL | Status: DC
  Administered 2015-03-03 – 2015-03-10 (×8): 10 mg via ORAL
  Filled 2015-03-03 (×8): qty 1

## 2015-03-03 MED ORDER — SODIUM CHLORIDE 0.9 % IV SOLN
2000.0000 mg | Freq: Once | INTRAVENOUS | Status: AC
  Administered 2015-03-03: 2000 mg via INTRAVENOUS
  Filled 2015-03-03: qty 2000

## 2015-03-03 MED ORDER — LEVOTHYROXINE SODIUM 50 MCG PO TABS
50.0000 ug | ORAL_TABLET | Freq: Every day | ORAL | Status: DC
  Administered 2015-03-04 – 2015-03-10 (×7): 50 ug via ORAL
  Filled 2015-03-03: qty 2
  Filled 2015-03-03 (×4): qty 1
  Filled 2015-03-03: qty 2
  Filled 2015-03-03: qty 1

## 2015-03-03 MED ORDER — VANCOMYCIN HCL IN DEXTROSE 1-5 GM/200ML-% IV SOLN
1000.0000 mg | Freq: Three times a day (TID) | INTRAVENOUS | Status: DC
  Filled 2015-03-03 (×2): qty 200

## 2015-03-03 MED ORDER — ANTISEPTIC ORAL RINSE SOLUTION (CORINZ)
7.0000 mL | Freq: Four times a day (QID) | OROMUCOSAL | Status: DC
  Administered 2015-03-05 – 2015-03-10 (×14): 7 mL via OROMUCOSAL

## 2015-03-03 MED ORDER — RESOURCE THICKENUP CLEAR PO POWD
ORAL | Status: DC | PRN
  Filled 2015-03-03: qty 125

## 2015-03-03 NOTE — Clinical Social Work Psych Note (Signed)
Psychiatry was consulted due to overdose attempt.  Patient was intubated- now extubated and has transfer orders in to [facility].  Psych aware of the consult and is now following.    Vickii Penna, LCSW [phone]  [id number]; 2S 15-16 and Hospital Psychiatric Service Line Licensed Clinical Social Worker

## 2015-03-03 NOTE — Consult Note (Signed)
[facility] Consult   Reason for Consult:  Intentional overdose Referring Physician:  Dr. Nelda Marseille Patient Identification: Brittany [name] MRN:  [id number] Principal Diagnosis: Drug overdose Diagnosis:   Patient Active Problem List   Diagnosis Date Noted  . Postextubation stridor [T85.89XA, R06.1]   . History of hepatitis C virus infection [Z86.19] 02/26/2015  . Acute respiratory failure ([place]) [J96.00]   . Altered mental status [R41.82]   . Endotracheally intubated [Z78.9]   . Acute encephalopathy [G93.40] 02/24/2015  . Drug overdose [T50.901A] 02/24/2015  . 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 Right facial pain [R51] 01/05/2015  . Orbit fracture ([place]) [S02.80XA] 11/30/2013  . Polypharmacy [Z79.899] 01/18/2012    Class: Chronic  . Bipolar affective ([place]) [F31.9] 01/18/2012  . Borderline personality disorder [F60.3] 01/18/2012  . Anxiety disorder [F41.9] 01/14/2012  . Pyelonephritis [N12] 09/16/2011  . Hypothyroidism [E03.9] 09/16/2011  . Anxiety [F41.9] 09/16/2011    Total Time spent with patient: 45 minutes  Subjective:   Brittany [name] is a 50 y.o. female patient admitted with intentional drug overdose.  HPI: Brittany [name] is a 50 years old female admitted to [facility] with intentional drug overdose and has bipolar disorder. Patient seen, chart reviewed and case discussed with the treatment team for face-to-face psychiatric consultation and evaluation of increased symptoms of depression and is intentional drug overdose reportedly taken 10 piece of baclofen and possibly 50 to self nortriptyline. Patient required intubation twice since was admitted to the [facility]. Patient was intubated second time because of stridor. Patient stated that she has taken too many please because she's not feeling good and hoping to feel better but she end up becoming more sick. Patient denies active suicidal/homicidal ideation, intention or plans. Patient has no evidence of  auditory/visual hallucinations, paranoia and delusions. This evaluation is limited because patient talking with the low twice and sometimes hard to hear her effectively.  Medical history:  Patient is a 50 year old female with a past medical history of hypertension, hypothyroidism, depression, bipolar disorder, anxiety, and history of IV drug use presenting OD with 10 pills from her baclofen bottle and possibly 50 pills from her nortriptyline bottle.  Past Psychiatric History: Patient is known to the behavioral health Hospital in emergency department from her multiple visits over the years. Patient reportedly not seeing any psychiatrist but receiving her medications from the primary care physician. Patient used to see a psychiatrist at [facility] in the past .  Risk to Self:   Risk to Others:   Prior Inpatient Therapy:   Prior Outpatient Therapy:    Past Medical History:  Past Medical History  Diagnosis Date  . Hypertension   . Bipolar 1 disorder ([place])   . Anxiety   . Back pain   . Bulging disc   . Ruptured intervertebral disc   . Migraine   . Head injury   . Short-term memory loss   . Depression   . Hypothyroidism   . Goiter   . Facial pain   . Facial nerve injury     Past Surgical History  Procedure Laterality Date  . Abdominal hysterectomy     Family History:  Family History  Problem Relation Age of Onset  . Migraines Mother   . Migraines Father   . Other Father   . Migraines Sister    Family Psychiatric  History: Patient states that she lives with her fianc who is a Pharmacist, hospital. Patient has a daughter who does not live with her.  Social History:  History  Alcohol Use No     History  Drug Use  . Yes  . Special: Marijuana    Social History   Social History  . Marital Status: Legally Separated    Spouse Name: N/A  . Number of Children: 1  . Years of Education: 16   Occupational History  . Unemployed    Social History Main Topics  . Smoking status: Never Smoker    . Smokeless tobacco: Never Used  . Alcohol Use: No  . Drug Use: Yes    Special: Marijuana  . Sexual Activity: Yes    Birth Control/ Protection: Condom   Other Topics Concern  . None   Social History Narrative   Lives at home with her fiancee.   Right-handed.   No caffeine use.   Additional Social History:                          Allergies:   Allergies  Allergen Reactions  . Toradol [Ketorolac Tromethamine] Swelling  . Suboxone [Buprenorphine Hcl-Naloxone Hcl]     cellulitis   . Tegretol [Carbamazepine] Rash  . Trazodone And Nefazodone Other (See Comments)    Bladder infection    Labs:  Results for orders placed or performed during the hospital encounter of 02/24/15 (from the past 48 hour(s))  Glucose, capillary     Status: Abnormal   Collection Time: 03/01/15 11:05 AM  Result Value Ref Range   Glucose-Capillary 154 (H) 65 - 99 mg/dL   Comment 1 Capillary Specimen   Glucose, capillary     Status: Abnormal   Collection Time: 03/01/15  4:14 PM  Result Value Ref Range   Glucose-Capillary 139 (H) 65 - 99 mg/dL   Comment 1 Capillary Specimen   Glucose, capillary     Status: Abnormal   Collection Time: 03/01/15  7:58 PM  Result Value Ref Range   Glucose-Capillary 152 (H) 65 - 99 mg/dL   Comment 1 Capillary Specimen   Glucose, capillary     Status: Abnormal   Collection Time: 03/02/15  1:18 AM  Result Value Ref Range   Glucose-Capillary 150 (H) 65 - 99 mg/dL   Comment 1 Capillary Specimen   CBC     Status: Abnormal   Collection Time: 03/02/15  3:40 AM  Result Value Ref Range   WBC 13.0 (H) 4.0 - 10.5 K/uL   RBC 3.62 (L) 3.87 - 5.11 MIL/uL   Hemoglobin 11.6 (L) 12.0 - 15.0 g/dL   HCT 35.2 (L) 36.0 - 46.0 %   MCV 97.2 78.0 - 100.0 fL   MCH 32.0 26.0 - 34.0 pg   MCHC 33.0 30.0 - 36.0 g/dL   RDW 13.5 11.5 - 15.5 %   Platelets 313 150 - 400 K/uL  Basic metabolic panel     Status: Abnormal   Collection Time: 03/02/15  3:40 AM  Result Value Ref Range    Sodium 143 135 - 145 mmol/L   Potassium 4.2 3.5 - 5.1 mmol/L   Chloride 104 101 - 111 mmol/L   CO2 29 22 - 32 mmol/L   Glucose, Bld 149 (H) 65 - 99 mg/dL   BUN 28 (H) 6 - 20 mg/dL   Creatinine, Ser 0.62 0.44 - 1.00 mg/dL   Calcium 9.4 8.9 - 10.3 mg/dL   GFR calc non Af Amer >60 >60 mL/min   GFR calc Af Amer >60 >60 mL/min    Comment: (NOTE) The eGFR has been  calculated using the CKD EPI equation. This calculation has not been validated in all clinical situations. eGFR's persistently <60 mL/min signify possible Chronic Kidney Disease.    Anion gap 10 5 - 15  Magnesium     Status: Abnormal   Collection Time: 03/02/15  3:40 AM  Result Value Ref Range   Magnesium 2.5 (H) 1.7 - 2.4 mg/dL  Phosphorus     Status: None   Collection Time: 03/02/15  3:40 AM  Result Value Ref Range   Phosphorus 3.1 2.5 - 4.6 mg/dL  Glucose, capillary     Status: Abnormal   Collection Time: 03/02/15  4:20 AM  Result Value Ref Range   Glucose-Capillary 145 (H) 65 - 99 mg/dL   Comment 1 Capillary Specimen   Blood gas, arterial     Status: Abnormal   Collection Time: 03/02/15  5:09 AM  Result Value Ref Range   FIO2 0.40    Delivery systems VENTILATOR    Mode PRESSURE REGULATED VOLUME CONTROL    VT 450 mL   LHR 14 resp/min   Peep/cpap 5.0 cm H20   pH, Arterial 7.441 7.350 - 7.450   pCO2 arterial 40.9 35.0 - 45.0 mmHg   pO2, Arterial 103 (H) 80.0 - 100.0 mmHg   Bicarbonate 27.4 (H) 20.0 - 24.0 mEq/L   TCO2 28.7 0 - 100 mmol/L   Acid-Base Excess 3.5 (H) 0.0 - 2.0 mmol/L   O2 Saturation 97.6 %   Patient temperature 98.6    Collection site LEFT RADIAL    Drawn by [phone]    Sample type ARTERIAL DRAW   Glucose, capillary     Status: Abnormal   Collection Time: 03/02/15  8:25 AM  Result Value Ref Range   Glucose-Capillary 153 (H) 65 - 99 mg/dL   Comment 1 Capillary Specimen   Urinalysis, Routine w reflex microscopic (not at [facility])     Status: Abnormal   Collection Time: 03/02/15 10:50 AM  Result Value  Ref Range   Color, Urine YELLOW YELLOW   APPearance TURBID (A) CLEAR   Specific Gravity, Urine 1.021 1.005 - 1.030   pH 7.5 5.0 - 8.0   Glucose, UA NEGATIVE NEGATIVE mg/dL   Hgb urine dipstick NEGATIVE NEGATIVE   Bilirubin Urine NEGATIVE NEGATIVE   Ketones, ur NEGATIVE NEGATIVE mg/dL   Protein, ur NEGATIVE NEGATIVE mg/dL   Nitrite NEGATIVE NEGATIVE   Leukocytes, UA TRACE (A) NEGATIVE  Culture, Urine     Status: None (Preliminary result)   Collection Time: 03/02/15 10:50 AM  Result Value Ref Range   Specimen Description URINE, RANDOM    Special Requests NONE    Culture TOO YOUNG TO READ    Report Status PENDING   Urine microscopic-add on     Status: Abnormal   Collection Time: 03/02/15 10:50 AM  Result Value Ref Range   Squamous Epithelial / LPF 0-5 (A) NONE SEEN   WBC, UA 0-5 0 - 5 WBC/hpf   RBC / HPF 0-5 0 - 5 RBC/hpf   Bacteria, UA FEW (A) NONE SEEN   Crystals CA OXALATE CRYSTALS (A) NEGATIVE   Urine-Other AMORPHOUS URATES/PHOSPHATES   Glucose, capillary     Status: Abnormal   Collection Time: 03/02/15 11:11 AM  Result Value Ref Range   Glucose-Capillary 152 (H) 65 - 99 mg/dL  Culture, blood (Routine X 2) w Reflex to ID Panel     Status: None (Preliminary result)   Collection Time: 03/02/15 12:18 PM  Result Value Ref Range  Specimen Description BLOOD LEFT ANTECUBITAL    Special Requests BOTTLES DRAWN AEROBIC AND ANAEROBIC 5CCS    Culture  Setup Time      GRAM POSITIVE COCCI IN CLUSTERS AEROBIC BOTTLE ONLY CRITICAL RESULT CALLED TO, READ BACK BY AND VERIFIED WITH: E. SMITH,RN AT 5170 ON [id number] BY S. YARBROUGH    Culture NO GROWTH < 24 HOURS    Report Status PENDING   Culture, blood (Routine X 2) w Reflex to ID Panel     Status: None (Preliminary result)   Collection Time: 03/02/15 12:35 PM  Result Value Ref Range   Specimen Description BLOOD LEFT HAND    Special Requests IN PEDIATRIC BOTTLE 1CCS    Culture  Setup Time      GRAM POSITIVE COCCI IN CLUSTERS AEROBIC  BOTTLE ONLY CRITICAL RESULT CALLED TO, READ BACK BY AND VERIFIED WITH: E SMITH 03/03/15 @ 0925 M VESTAL    Culture GRAM POSITIVE COCCI    Report Status PENDING   Glucose, capillary     Status: Abnormal   Collection Time: 03/02/15  4:11 PM  Result Value Ref Range   Glucose-Capillary 108 (H) 65 - 99 mg/dL   Comment 1 Notify RN    Comment 2 Document in Chart   Glucose, capillary     Status: Abnormal   Collection Time: 03/02/15  8:39 PM  Result Value Ref Range   Glucose-Capillary 131 (H) 65 - 99 mg/dL   Comment 1 Capillary Specimen    Comment 2 Notify RN   Glucose, capillary     Status: Abnormal   Collection Time: 03/02/15 11:17 PM  Result Value Ref Range   Glucose-Capillary 128 (H) 65 - 99 mg/dL   Comment 1 Capillary Specimen    Comment 2 Notify RN   Glucose, capillary     Status: Abnormal   Collection Time: 03/03/15  4:05 AM  Result Value Ref Range   Glucose-Capillary 113 (H) 65 - 99 mg/dL   Comment 1 Capillary Specimen   Basic metabolic panel     Status: Abnormal   Collection Time: 03/03/15  4:15 AM  Result Value Ref Range   Sodium 144 135 - 145 mmol/L   Potassium 3.8 3.5 - 5.1 mmol/L   Chloride 106 101 - 111 mmol/L   CO2 25 22 - 32 mmol/L   Glucose, Bld 119 (H) 65 - 99 mg/dL   BUN 22 (H) 6 - 20 mg/dL   Creatinine, Ser 0.55 0.44 - 1.00 mg/dL   Calcium 9.5 8.9 - 10.3 mg/dL   GFR calc non Af Amer >60 >60 mL/min   GFR calc Af Amer >60 >60 mL/min    Comment: (NOTE) The eGFR has been calculated using the CKD EPI equation. This calculation has not been validated in all clinical situations. eGFR's persistently <60 mL/min signify possible Chronic Kidney Disease.    Anion gap 13 5 - 15  CBC with Differential/Platelet     Status: Abnormal   Collection Time: 03/03/15  4:15 AM  Result Value Ref Range   WBC 12.3 (H) 4.0 - 10.5 K/uL   RBC 3.86 (L) 3.87 - 5.11 MIL/uL   Hemoglobin 12.4 12.0 - 15.0 g/dL   HCT 37.7 36.0 - 46.0 %   MCV 97.7 78.0 - 100.0 fL   MCH 32.1 26.0 - 34.0 pg    MCHC 32.9 30.0 - 36.0 g/dL   RDW 13.6 11.5 - 15.5 %   Platelets 329 150 - 400 K/uL   Neutrophils Relative % 82 %  Neutro Abs 10.1 (H) 1.7 - 7.7 K/uL   Lymphocytes Relative 11 %   Lymphs Abs 1.4 0.7 - 4.0 K/uL   Monocytes Relative 6 %   Monocytes Absolute 0.8 0.1 - 1.0 K/uL   Eosinophils Relative 0 %   Eosinophils Absolute 0.0 0.0 - 0.7 K/uL   Basophils Relative 0 %   Basophils Absolute 0.1 0.0 - 0.1 K/uL  Magnesium     Status: None   Collection Time: 03/03/15  4:15 AM  Result Value Ref Range   Magnesium 2.3 1.7 - 2.4 mg/dL  Phosphorus     Status: None   Collection Time: 03/03/15  4:15 AM  Result Value Ref Range   Phosphorus 3.7 2.5 - 4.6 mg/dL    Current Facility-Administered Medications  Medication Dose Route Frequency Provider Last Rate Last Dose  . 0.9 %  sodium chloride infusion  250 mL Intravenous PRN Juluis Mire, MD 10 mL/hr at 03/01/15 2000 250 mL at 03/01/15 2000  . acetaminophen (TYLENOL) solution 650 mg  650 mg Oral Q6H PRN Chesley Mires, MD   650 mg at 03/02/15 1049  . antiseptic oral rinse solution (CORINZ)  7 mL Mouth Rinse 10 times per day Chesley Mires, MD   7 mL at 03/03/15 0524  . bisacodyl (DULCOLAX) suppository 10 mg  10 mg Rectal Daily PRN Chesley Mires, MD      . chlorhexidine gluconate (PERIDEX) 0.12 % solution 15 mL  15 mL Mouth Rinse BID Chesley Mires, MD   15 mL at 03/03/15 0751  . DULoxetine (CYMBALTA) DR capsule 30 mg  30 mg Oral Daily Rush Farmer, MD   30 mg at 03/02/15 0851  . enoxaparin (LOVENOX) injection 40 mg  40 mg Subcutaneous Daily Marjan Rabbani, MD   40 mg at 03/03/15 0906  . feeding supplement (PRO-STAT SUGAR FREE 64) liquid 60 mL  60 mL Per Tube TID Asencion Islam, RD   60 mL at 03/02/15 0850  . feeding supplement (VITAL HIGH PROTEIN) liquid 1,000 mL  1,000 mL Per Tube Q24H Asencion Islam, RD   1,000 mL at 02/28/15 2101  . [START ON 03/04/2015] levothyroxine (SYNTHROID, LEVOTHROID) tablet 50 mcg  50 mcg Oral QAC breakfast Corky Sox, MD       . lisinopril (PRINIVIL,ZESTRIL) tablet 10 mg  10 mg Oral Daily Corky Sox, MD      . multivitamin with minerals tablet 1 tablet  1 tablet Per Tube Daily Asencion Islam, RD   1 tablet at 03/02/15 0851  . sennosides (SENOKOT) 8.8 MG/5ML syrup 5 mL  5 mL Per Tube BID PRN Chesley Mires, MD      . vancomycin (VANCOCIN) 2,000 mg in sodium chloride 0.9 % 500 mL IVPB  2,000 mg Intravenous Once Darl Pikes, RPH      . vancomycin (VANCOCIN) IVPB 1000 mg/200 mL premix  1,000 mg Intravenous Q8H Darl Pikes, [facility]        Musculoskeletal: Strength & Muscle Tone: decreased Gait & Station: unable to stand Patient leans: N/A  Psychiatric Specialty Exam: ROS generalized weakness, frequent coughing, low twice secondary to recent extubation twice. Patient endorses not feeling well, depression and intentional overdose but not suicidal attempt. Patient denies nausea, vomiting, abdominal pain, shortness of breath and chest pain No Fever-chills, No Headache, No changes with Vision or hearing, reports vertigo No problems swallowing food or Liquids, No Chest pain, Cough or Shortness of Breath, No Abdominal pain, No Nausea or Vommitting, Bowel  movements are regular, No Blood in stool or Urine, No dysuria, No new skin rashes or bruises, No new joints pains-aches,  No new weakness, tingling, numbness in any extremity, No recent weight gain or loss, No polyuria, polydypsia or polyphagia,   A full 10 point Review of Systems was done, except as stated above, all other Review of Systems were negative.   Blood pressure 147/95, pulse 85, temperature 97.5 F (36.4 C), temperature source Oral, resp. rate 13, height 5' 5"  (1.651 m), weight 103.6 kg (228 lb 6.3 oz), SpO2 98 %.Body mass index is 38.01 kg/(m^2).  General Appearance: Casual  Eye Contact::  Good  Speech:  Slow and soft voice  Volume:  Decreased  Mood:  Depressed  Affect:  Appropriate and Congruent  Thought Process:  Coherent and Goal  Directed  Orientation:  Full (Time, Place, and Person)  Thought Content:  WDL  Suicidal Thoughts:  No  Homicidal Thoughts:  No  Memory:  Immediate;   Fair Recent;   Fair  Judgement:  Impaired  Insight:  Fair  Psychomotor Activity:  Decreased  Concentration:  Fair  Recall:  Good  Fund of Knowledge:Good  Language: Good  Akathisia:  Yes  Handed:  Right  AIMS (if indicated):     Assets:  Communication Skills Desire for Improvement Financial Resources/Insurance Housing Intimacy Leisure Time Resilience Social Support Transportation  ADL's:  Impaired  Cognition: WNL  Sleep:      Treatment Plan Summary: Daily contact with patient to assess and evaluate symptoms and progress in treatment and Medication management Continue current medication management without changes Chief Technology Officer as patient has a status post intentional overdose and recent intubation twice Appreciate psychiatric consultation and follow up as clinically required Please contact [phone] or [phone] if needs further assistance  Disposition: Recommend psychiatric Inpatient admission when medically cleared. Supportive therapy provided about ongoing stressors.  Dava Rensch,JANARDHAHA R. 03/03/2015 10:36 AM

## 2015-03-03 NOTE — Evaluation (Signed)
Clinical/Bedside Swallow Evaluation Patient Details  Name: Brittany [name] MRN: [id number] Date of Birth: February 02, 1966  Today's Date: 03/03/2015 Time: SLP Start Time (ACUTE ONLY): 0820 SLP Stop Time (ACUTE ONLY): 0835 SLP Time Calculation (min) (ACUTE ONLY): 15 min  Past Medical History:  Past Medical History  Diagnosis Date  . Hypertension   . Bipolar 1 disorder ([facility])   . Anxiety   . Back pain   . Bulging disc   . Ruptured intervertebral disc   . Migraine   . Head injury   . Short-term memory loss   . Depression   . Hypothyroidism   . Goiter   . Facial pain   . Facial nerve injury    Past Surgical History:  Past Surgical History  Procedure Laterality Date  . Abdominal hysterectomy     HPI:  Patient is a 50 year old female with a past medical history of hypertension, hypothyroidism, depression, bipolar disorder, anxiety, and history of IV drug use presenting with overdose (10 pills from her baclofen bottle and possibly 50 pills from her nortriptyline bottle). Intubated 1/11-1/17. CXR improving basilar atelectasis.   Assessment / Plan / Recommendation Clinical Impression  Pt was provided with PO to assess proper swallow function. No oral phase deficits were observed. Upon intake of thin liquids, pt's eyes became watery and immediate weak cough indicative of likely airway compromise. Pt reported sensation of penetration/aspiration. Pt was educated re: MBSS later this am in order to advise diet advancement. Recommend NPO until MBSS results are determined.    Aspiration Risk  Severe aspiration risk    Diet Recommendation NPO   Medication Administration: Via alternative means Compensations: Slow rate;Small sips/bites    Other  Recommendations Oral Care Recommendations: Oral care QID   Follow up Recommendations   (TBD)    Frequency and Duration            Prognosis Prognosis for Safe Diet Advancement: Fair Barriers to Reach Goals: Other (Comment) (pt appears scared to  eat/drink)      Swallow Study   General HPI: Patient is a 50 year old female with a past medical history of hypertension, hypothyroidism, depression, bipolar disorder, anxiety, and history of IV drug use presenting with overdose (10 pills from her baclofen bottle and possibly 50 pills from her nortriptyline bottle). Intubated 1/11-1/17. CXR improving basilar atelectasis. Type of Study: MBS-Modified Barium Swallow Study Previous Swallow Assessment: none found Diet Prior to this Study: NPO Temperature Spikes Noted: No Respiratory Status:  (face tent) History of Recent Intubation: Yes Length of Intubations (days): 6 days Date extubated: 03/02/15 Behavior/Cognition: Alert;Cooperative;Requires cueing Oral Cavity Assessment: Within Functional Limits Oral Care Completed by SLP: Yes Oral Cavity - Dentition: Adequate natural dentition Vision: Functional for self-feeding Self-Feeding Abilities: Able to feed self;Needs set up Patient Positioning: Upright in bed Baseline Vocal Quality: Low vocal intensity;Aphonic Volitional Cough: Weak Volitional Swallow: Able to elicit (difficult initiation)    Oral/Motor/Sensory Function Overall Oral Motor/Sensory Function: Mild impairment Facial ROM: Within Functional Limits Facial Symmetry: Within Functional Limits Facial Strength: Reduced right;Reduced left Facial Sensation: Within Functional Limits Lingual ROM: Reduced right;Reduced left Lingual Symmetry: Within Functional Limits Lingual Strength: Reduced Lingual Sensation: Within Functional Limits Velum: Within Functional Limits Mandible: Within Functional Limits   Ice Chips Ice chips: Not tested   Thin Liquid Thin Liquid: Impaired Presentation: Cup;Self Fed Pharyngeal  Phase Impairments: Suspected delayed Swallow;Decreased hyoid-laryngeal movement;Cough - Immediate (pt immediately suctioned due to discomfort)    Nectar Thick Nectar Thick Liquid: Not tested   Honey Thick  Honey Thick Liquid: Not  tested   Puree Puree: Within functional limits Presentation: Self Fed;Spoon Oral Phase Impairments:  (none) Pharyngeal Phase Impairments:  (none)   Solid   GO   Solid: Not tested        Lynita Lombard 03/03/2015,1:25 PM   Lynita Lombard, Student-SLP

## 2015-03-03 NOTE — Evaluation (Signed)
Physical Therapy Evaluation Patient Details Name: Brittany [name] MRN: [id number] DOB: May 09, 1965 Today's Date: 03/03/2015   History of Present Illness  Patient is a 50 year old female with a past medical history of hypertension, hypothyroidism, depression, bipolar disorder, anxiety, and history of IV drug use presenting with overdose (10 pills from her baclofen bottle and possibly 50 pills from her nortriptyline bottle). Intubated 1/11-1/17. CXR improving basilar atelectasis.   Clinical Impression  Pt admitted with above diagnosis. Pt currently with functional limitations due to the deficits listed below (see PT Problem List). Pt was able to pivot to 3N1 and then to recliner with mod assist.  Should progress well and be able to go home with fiancee as he can be there 24 hours.  Question whether pt will need Behavioral health stay given reason for admit.  Will follow and assist with d/c plans prn.   Pt will benefit from skilled PT to increase their independence and safety with mobility to allow discharge to the venue listed below.      Follow Up Recommendations Home health PT;Supervision/Assistance - 24 hour    Equipment Recommendations  Rolling walker with 5" wheels;3in1 (PT)    Recommendations for Other Services       Precautions / Restrictions Precautions Precautions: Fall Restrictions Weight Bearing Restrictions: No      Mobility  Bed Mobility Overal bed mobility: Needs Assistance;+2 for physical assistance Bed Mobility: Supine to Sit     Supine to sit: Mod assist;+2 for physical assistance     General bed mobility comments: Needed assist for elevation of trunk and then pt with significant anterior lean with impulsive movements to try and get to EOB needing mod assist to right pt.    Transfers Overall transfer level: Needs assistance Equipment used: 2 person hand held assist Transfers: Sit to/from [employer] Sit to Stand: Mod assist;+2 physical  assistance Stand pivot transfers: Min assist;+2 safety/equipment       General transfer comment: Pt stood with assist to power up and steadying assist needed.  STand pivot transfer to 3N1 with pt having BM.  Pt stood with same assist to be cleaned with NT cleaning pt and then pt turned to get to recliner taking pivotal steps needing min assist and definite need ofr UEs for support.    Ambulation/Gait                Stairs            Wheelchair Mobility    Modified Rankin (Stroke Patients Only)       Balance Overall balance assessment: Needs assistance;History of Falls Sitting-balance support: Feet supported;Bilateral upper extremity supported Sitting balance-Leahy Scale: Poor Sitting balance - Comments: requires bil UE support for balance.  Postural control: Posterior lean (anterior lean) Standing balance support: Bilateral upper extremity supported;During functional activity Standing balance-Leahy Scale: Poor Standing balance comment: requiring bil UE support.                             Pertinent Vitals/Pain Pain Assessment: No/denies pain Faces Pain Scale: Hurts little more Pain Location: back Pain Descriptors / Indicators: Grimacing Pain Intervention(s): Monitored during session  VSS    Home Living Family/patient expects to be discharged to:: Private residence Living Arrangements: Spouse/significant other (fiancee) Available Help at Discharge: Friend(s);Family;Available 24 hours/day Type of Home: House Home Access: Stairs to enter Entrance Stairs-Rails: Right Entrance Stairs-Number of Steps: 4 Home Layout: One level Home Equipment: Shower seat  Prior Function Level of Independence: Independent               Hand Dominance        Extremity/Trunk Assessment   Upper Extremity Assessment: Defer to OT evaluation           Lower Extremity Assessment: Generalized weakness         Communication   Communication: No  difficulties (speaks softly in whisper)  Cognition Arousal/Alertness: Awake/alert Behavior During Therapy: Anxious;Impulsive Overall Cognitive Status: History of cognitive impairments - at baseline Area of Impairment: Following commands;Safety/judgement;Problem solving       Following Commands: Follows one step commands consistently Safety/Judgement: Decreased awareness of safety;Decreased awareness of deficits   Problem Solving: Difficulty sequencing;Requires verbal cues;Slow processing;Decreased initiation General Comments: Pt with extensive psych history.  Crying several times inappropriately during session. Very childlike behavior.      General Comments      Exercises General Exercises - Lower Extremity Ankle Circles/Pumps: AROM;5 reps;Both;Seated Long Arc Quad: AROM;Both;10 reps;Seated      Assessment/Plan    PT Assessment Patient needs continued PT services  PT Diagnosis Generalized weakness   PT Problem List Decreased activity tolerance;Decreased balance;Decreased mobility;Decreased knowledge of use of DME;Decreased safety awareness;Decreased coordination;Decreased knowledge of precautions;Obesity  PT Treatment Interventions DME instruction;Gait training;Stair training;Functional mobility training;Therapeutic activities;Therapeutic exercise;Balance training;Patient/family education   PT Goals (Current goals can be found in the Care Plan section) Acute Rehab PT Goals Patient Stated Goal: to get better PT Goal Formulation: With patient Time For Goal Achievement: 03/17/15 Potential to Achieve Goals: Good    Frequency Min 3X/week   Barriers to discharge        Co-evaluation               End of Session Equipment Utilized During Treatment: Gait belt;Oxygen Activity Tolerance: Patient limited by fatigue Patient left: in chair;with call bell/phone within reach;with family/visitor present;with nursing/sitter in room Nurse Communication: Mobility status          Time: 1610-9604 PT Time Calculation (min) (ACUTE ONLY): 33 min   Charges:   PT Evaluation $PT Eval Moderate Complexity: 1 Procedure PT Treatments $Therapeutic Activity: 8-22 mins   PT G CodesBerline Lopes 04/01/2015, 2:43 PM Donelle Hise,PT Acute Rehabilitation [phone] (pager)

## 2015-03-03 NOTE — Progress Notes (Signed)
Came to assess pt and pt is stable at this time awake and watching tv.

## 2015-03-03 NOTE — Progress Notes (Signed)
Speech Language Pathology Patient Details Name: Brittany [name] MRN: [id number] DOB: May 01, 1965 Today's Date: 03/03/2015 Time: 1005-1020 SLP Time Calculation (min) (ACUTE ONLY): 15 min     MBS completed. Full report located under chart review in imaging section. Click on DG swallow function.  Recommend: Dys 1 texture, pudding thick liquids only.   [name].Ed [employer] [phone]

## 2015-03-03 NOTE — Progress Notes (Signed)
Nutrition Follow-up  DOCUMENTATION CODES:   Obesity unspecified  INTERVENTION:   Magic cup TID with meals, each supplement provides 290 kcal and 9 grams of protein  NUTRITION DIAGNOSIS:   Inadequate oral intake related to dysphagia as evidenced by meal completion < 50%. Ongoing.   GOAL:   Patient will meet greater than or equal to 90% of their needs Not yet met.   MONITOR:   PO intake, Supplement acceptance, Diet advancement, Labs, I & O's  ASSESSMENT:   Pt with past medical history of hypertension, hypothyroidism, depression, bipolar disorder, anxiety, and history of IV drug use presenting OD.  Labs and Medications reviewed. Spoke with pt who had lunch tray in front of her, she had only ate her pureed fruit.  Pt agreeable to magic cup.  Sitter at bedside.  S/P MBS on pureed and pudding thick liquids.   Diet Order:  DIET - DYS 1 Room service appropriate?: Yes; Fluid consistency:: Pudding Thick  Skin:  Reviewed, no issues  Last BM:  1/18  Height:   Ht Readings from Last 1 Encounters:  02/25/15 5' 5" (1.651 m)   Weight:   Wt Readings from Last 1 Encounters:  03/03/15 228 lb 6.3 oz (103.6 kg)   Ideal Body Weight:  54.5 kg  BMI:  Body mass index is 38.01 kg/(m^2).  Estimated Nutritional Needs:   Kcal:  1700-1900  Protein:  100-115 grams  Fluid:  > 1.7 L/day  EDUCATION NEEDS:   No education needs identified at this time  [place], [place], [place] Pager [phone] After Hours Pager

## 2015-03-03 NOTE — Progress Notes (Signed)
PULMONARY / CRITICAL CARE MEDICINE   Name: Brittany [name] MRN: [id number] DOB: 1965-06-25    ADMISSION DATE:  02/24/2015 CONSULTATION DATE:  02/24/15  REFERRING MD :  ED PRIMARY SERVICE: Critical Care   CHIEF COMPLAINT:  Altered mental status  HISTORY OF PRESENT ILLNESS:  Patient is currently on vent and under sedation, history was obtained from emergency room documentation. Patient is a 50 year old female with a past medical history of hypertension, hypothyroidism, depression, bipolar disorder, anxiety, and history of IV drug use presenting OD with 10 pills from her baclofen bottle and possibly 50 pills from her nortriptyline bottle.  SUBJECTIVE:   Doing well this AM, having some throat pain. Blood cultures with 2/2 GPC's.   VITAL SIGNS: Temp:  [97.2 F (36.2 C)-100.9 F (38.3 C)] 98.2 F (36.8 C) (01/18 0400) Pulse Rate:  [67-92] 82 (01/18 0800) Resp:  [11-18] 12 (01/18 0800) BP: (121-155)/(77-105) 135/86 mmHg (01/18 0800) SpO2:  [94 %-100 %] 98 % (01/18 0800) FiO2 (%):  [35 %-40 %] 35 % (01/18 0800) Weight:  [228 lb 6.3 oz (103.6 kg)] 228 lb 6.3 oz (103.6 kg) (01/18 0400)   HEMODYNAMICS:   VENTILATOR SETTINGS: Vent Mode:  [-] CPAP;PSV FiO2 (%):  [35 %-40 %] 35 % PEEP:  [5 cmH20] 5 cmH20 Pressure Support:  [5 cmH20] 5 cmH20   INTAKE / OUTPUT: Intake/Output      01/17 0701 - 01/18 0700 01/18 0701 - 01/19 0700   I.V. (mL/kg) 192.5 (1.9)    NG/GT 20    Total Intake(mL/kg) 212.5 (2.1)    Urine (mL/kg/hr) 3350 (1.3)    Stool 1 (0)    Total Output 3351     Net -3138.5            PHYSICAL EXAMINATION: General:  Obese woman on vent, alert, no distress.  Neuro: PERRL, follows commands, moves all extremities spontaneously.  HEENT: PERRL. Dysconjugate gaze. Moist mucus membranes Cardiovascular:  RRR, no murmurs, gallops, or rubs.  Lungs: Breath sounds equal and clear, no wheezes, rhonchi, or rales.  Abdomen: Soft, non-tender, non-distended. BS + Musculoskeletal:  No  edema Skin:  Warm and dry  LABS:  CBC  Recent Labs Lab 03/01/15 0316 03/02/15 0340 03/03/15 0415  WBC 10.6* 13.0* 12.3*  HGB 10.6* 11.6* 12.4  HCT 32.5* 35.2* 37.7  PLT 244 313 329   Coag's  Recent Labs Lab 02/25/15 0320  APTT 27  INR 1.04   BMET  Recent Labs Lab 03/01/15 0316 03/02/15 0340 03/03/15 0415  NA 138 143 144  K 4.4 4.2 3.8  CL 103 104 106  CO2 BUN 12 28* 22*  CREATININE 0.53 0.62 0.55  GLUCOSE 157* 149* 119*   Electrolytes  Recent Labs Lab 03/01/15 0316 03/02/15 0340 03/03/15 0415  CALCIUM 8.9 9.4 9.5  MG 2.0 2.5* 2.3  PHOS 3.6 3.1 3.7      ABG  Recent Labs Lab 02/26/15 0341 02/28/15 1225 03/02/15 0509  PHART 7.413 7.365 7.441  PCO2ART 52.1* 48.7* 40.9  PO2ART 70.0* 92.6 103*   Liver Enzymes  Recent Labs Lab 02/24/15 1833 02/26/15 0305 02/27/15 0006  AST 24 37 46*  ALT 34 30 36  ALKPHOS 81 65 70  BILITOT 0.7 0.6 0.8  ALBUMIN 4.6 3.3* 3.0*    Glucose  Recent Labs Lab 03/02/15 0825 03/02/15 1111 03/02/15 1611 03/02/15 2039 03/02/15 2317 03/03/15 0405  GLUCAP 153* 152* 108* 131* 128* 113*   Imaging Dg Chest Port 1 View  03/02/2015  CLINICAL DATA:  Endotracheal tube EXAM: PORTABLE CHEST 1 VIEW COMPARISON:  Yesterday FINDINGS: Endotracheal tube tip is 15 mm above the carina, different appearance from prior likely from patient positioning. The orogastric tube reaches the stomach at least. Low volumes with streaky basilar opacities, improved with better visualization of the central diaphragm. No edema, effusion, or air leak. Borderline cardiomegaly. Stable aortic contours. IMPRESSION: 1. Similar positioning of endotracheal and orogastric tubes. 2. Improving basilar atelectasis. Electronically Signed   By: Marnee Spring M.D.   On: 03/02/2015 07:40   CULTURES:  Blood 1/17 >> 2/2 GPC's (speciation/sensitivity pending)   ANTIBIOTICS: Vanc 1/17 >>    ASSESSMENT / PLAN:  PULMONARY A:  Acute  hypoxic/hypercarbic respiratory failure Basilar Atelectasis P:   Supplemental O2 Discontinue Steroids given no stridor  CARDIOVASCULAR A:  QRS prolongation H/o HTN P:  Discontinue Telemetry Restart Lisinopril  RENAL A:   AKI; resolved BUN improved P:   Discontinue steroids D/c IVF  GASTROINTESTINAL A:   No acute issues P:   D/c Protonix Regular diet  HEMATOLOGIC A:   Mild Leukocytosis; resolving Normocytic Anemia; resolved DVT PPx P:  Lovenox [place]  INFECTIOUS A:   Mild Leukocytosis Low Grade Fever; resolved GPC Bacteremia P:   Start Vancomycin Follow cultures for sensitivity  ENDOCRINE A:    Hypothyroidism P:   Change Synthroid to po dose Discontinue steroids  NEUROLOGIC A:   Acute Encephalopathy 2/2 Drug Overdose; resolved Dysconjugate left eye; likely chronic P:   Pending Psych evaluation. To see today Hold home medications until seen by psych; Neurontin, Nortriptyline, and Ambien Continue Cymbalta Myasthenia Ab pending  Move to med-surg, pending psych evaluation, transfer to [facility].     Lauris Chroman, MD PGY-3, Internal Medicine Pager: [phone]  Attending Note:  50 year old female with PMH of bipolar disorder who presents with nortripyline and baclofen intentional overdose.  Was intubated for airway protection, extubated then reintubated for stridor.  Now extubated and doing well from a respiratory standpoint.  On exam, lungs are clear to auscultation.  I reviewed CXR myself, some mild bibasilar atelectasis.  Discussed with psych MD and medical resident.  2/2 GPCs in blood:  - Add vancomycin.  - Suspect will need vanc for a few days until speciation.  Suicide attempt:  - Recruitment consultant ordered.  - To inpatient psych after cultures result and decision on vancomycin can be done.  - Discussed with the psychiatry MD today.  Stridor: resolved.  - D/C steroids.  Concern for airway protection:  - Swallow evaluation was insufficient will  order a modified.  Hypothyroid:  - Synthroid changed to PO.  Transfer to Med-Surg, [facility] for 1/19.  PCCM will sign off.  Patient seen and examined, agree with above note.  I dictated the care and orders written for this patient under my direction.  Alyson Reedy, MD [phone]

## 2015-03-03 NOTE — Progress Notes (Signed)
ANTIBIOTIC CONSULT NOTE - INITIAL  Pharmacy Consult for Vancomycin Indication: Bacteremia  Allergies  Allergen Reactions  . Toradol [Ketorolac Tromethamine] Swelling  . Suboxone [Buprenorphine Hcl-Naloxone Hcl]     cellulitis   . Tegretol [Carbamazepine] Rash  . Trazodone And Nefazodone Other (See Comments)    Bladder infection    Patient Measurements: Height:  (165.1 cm) Weight: 228 lb 6.3 oz (103.6 kg) IBW/kg (Calculated) : 57  Vital Signs: Temp: 97.5 F (36.4 C) (01/18 0800) Temp Source: Oral (01/18 0400) BP: 147/95 mmHg (01/18 0900) Pulse Rate: 85 (01/18 0900) Intake/Output from previous day: 01/17 0701 - 01/18 0700 In: 212.5 [I.V.:192.5; NG/GT:20] Out: 3351 [Urine:3350; Stool:1] Intake/Output from this shift:    Labs:  Recent Labs  03/01/15 0316 03/02/15 0340 03/03/15 0415  WBC 10.6* 13.0* 12.3*  HGB 10.6* 11.6* 12.4  PLT 244 313 329  CREATININE 0.53 0.62 0.55   Estimated Creatinine Clearance: 103.8 mL/min (by C-G formula based on Cr of 0.55). No results for input(s): VANCOTROUGH, VANCOPEAK, VANCORANDOM, GENTTROUGH, GENTPEAK, GENTRANDOM, TOBRATROUGH, TOBRAPEAK, TOBRARND, AMIKACINPEAK, AMIKACINTROU, AMIKACIN in the last 72 hours.   Microbiology: Recent Results (from the past 720 hour(s))  Urine culture     Status: None   Collection Time: 02/24/15  6:35 PM  Result Value Ref Range Status   Specimen Description URINE, CATHETERIZED  Final   Special Requests NONE  Final   Culture 1,000 COLONIES/mL INSIGNIFICANT GROWTH  Final   Report Status 02/25/2015 FINAL  Final  MRSA PCR Screening     Status: None   Collection Time: 02/25/15  9:43 AM  Result Value Ref Range Status   MRSA by PCR NEGATIVE NEGATIVE Final    Comment:        The GeneXpert MRSA Assay (FDA approved for NASAL specimens only), is one component of a comprehensive MRSA colonization surveillance program. It is not intended to diagnose MRSA infection nor to guide or monitor treatment  for MRSA infections.   Culture, blood (Routine X 2) w Reflex to ID Panel     Status: None (Preliminary result)   Collection Time: 02/26/15  7:20 PM  Result Value Ref Range Status   Specimen Description BLOOD LEFT HAND  Final   Special Requests IN PEDIATRIC BOTTLE 2CC  Final   Culture NO GROWTH 4 DAYS  Final   Report Status PENDING  Incomplete  Culture, blood (Routine X 2) w Reflex to ID Panel     Status: None (Preliminary result)   Collection Time: 02/26/15  8:59 PM  Result Value Ref Range Status   Specimen Description BLOOD RIGHT FINGER  Final   Special Requests IN PEDIATRIC BOTTLE 2CC  Final   Culture NO GROWTH 3 DAYS  Final   Report Status PENDING  Incomplete  Culture, blood (Routine X 2) w Reflex to ID Panel     Status: None (Preliminary result)   Collection Time: 03/02/15 12:18 PM  Result Value Ref Range Status   Specimen Description BLOOD LEFT ANTECUBITAL  Final   Special Requests BOTTLES DRAWN AEROBIC AND ANAEROBIC 5CCS  Final   Culture  Setup Time   Final    GRAM POSITIVE COCCI IN CLUSTERS AEROBIC BOTTLE ONLY CRITICAL RESULT CALLED TO, READ BACK BY AND VERIFIED WITH: E. SMITH,RN AT 1610 ON [id number] BY Lucienne Capers    Culture PENDING  Incomplete   Report Status PENDING  Incomplete  Culture, blood (Routine X 2) w Reflex to ID Panel     Status: None (Preliminary result)  Collection Time: 03/02/15 12:35 PM  Result Value Ref Range Status   Specimen Description BLOOD LEFT HAND  Final   Special Requests IN PEDIATRIC BOTTLE 1CCS  Final   Culture  Setup Time   Final    GRAM POSITIVE COCCI IN CLUSTERS AEROBIC BOTTLE ONLY CRITICAL RESULT CALLED TO, READ BACK BY AND VERIFIED WITH: E SMITH 03/03/15 @ 0925 M VESTAL    Culture GRAM POSITIVE COCCI  Final   Report Status PENDING  Incomplete    Medical History: Past Medical History  Diagnosis Date  . Hypertension   . Bipolar 1 disorder (HCC)   . Anxiety   . Back pain   . Bulging disc   . Ruptured intervertebral disc   .  Migraine   . Head injury   . Short-term memory loss   . Depression   . Hypothyroidism   . Goiter   . Facial pain   . Facial nerve injury     Medications:  Scheduled:  . antiseptic oral rinse  7 mL Mouth Rinse 10 times per day  . chlorhexidine gluconate  15 mL Mouth Rinse BID  . DULoxetine  30 mg Oral Daily  . enoxaparin (LOVENOX) injection  40 mg Subcutaneous Daily  . feeding supplement (PRO-STAT SUGAR FREE 64)  60 mL Per Tube TID  . feeding supplement (VITAL HIGH PROTEIN)  1,000 mL Per Tube Q24H  . [START ON 03/04/2015] levothyroxine  50 mcg Oral QAC breakfast  . lisinopril  10 mg Oral Daily  . multivitamin with minerals  1 tablet Per Tube Daily  . vancomycin  2,000 mg Intravenous Once   Assessment: 73 yoF admitted for AMS and possible overdose. Now with BCx positive 2/2 with GPCs. Pharmacy consulted to dose vancomycin. WBC 12.3, Tmax 100.9.   Goal of Therapy:  Vancomycin trough level 15-20 mcg/ml  Plan:  Vancomycin LD  x1, followed by Vancomycin  Q8h Will continue to follow renal function, culture results, LOT, and antibiotic de-escalation plans   Remi Haggard, PharmD Clinical Pharmacist- Resident Pager: [phone]  Remi Haggard 03/03/2015,9:30 AM

## 2015-03-04 DIAGNOSIS — T50904A Poisoning by unspecified drugs, medicaments and biological substances, undetermined, initial encounter: Secondary | ICD-10-CM

## 2015-03-04 LAB — CULTURE, BLOOD (ROUTINE X 2): Culture: NO GROWTH

## 2015-03-04 LAB — GLUCOSE, CAPILLARY: GLUCOSE-CAPILLARY: 93 mg/dL (ref 65–99)

## 2015-03-04 MED ORDER — PHENOL 1.4 % MT LIQD: 1.0000 | OROMUCOSAL | Status: DC | PRN

## 2015-03-04 MED ORDER — ONDANSETRON HCL 4 MG/2ML IJ SOLN
4.0000 mg | Freq: Four times a day (QID) | INTRAMUSCULAR | Status: DC | PRN
  Administered 2015-03-04 (×2): 4 mg via INTRAVENOUS
  Filled 2015-03-04 (×2): qty 2

## 2015-03-04 MED ORDER — DEXAMETHASONE SODIUM PHOSPHATE 4 MG/ML IJ SOLN
4.0000 mg | Freq: Four times a day (QID) | INTRAMUSCULAR | Status: DC
  Filled 2015-03-04: qty 1

## 2015-03-04 MED ORDER — PANTOPRAZOLE SODIUM 40 MG PO TBEC
40.0000 mg | DELAYED_RELEASE_TABLET | Freq: Two times a day (BID) | ORAL | Status: DC
  Administered 2015-03-05 – 2015-03-10 (×10): 40 mg via ORAL
  Filled 2015-03-04 (×11): qty 1

## 2015-03-04 MED ORDER — DEXAMETHASONE SODIUM PHOSPHATE 4 MG/ML IJ SOLN
6.0000 mg | Freq: Four times a day (QID) | INTRAMUSCULAR | Status: DC
  Administered 2015-03-05 – 2015-03-06 (×6): 6 mg via INTRAVENOUS
  Filled 2015-03-04 (×6): qty 2

## 2015-03-04 NOTE — Progress Notes (Signed)
Patient's blood culture was positive for Gram positive Cocci. Patient refused her scheduled IV Vancomycin, stated it made her sick and have stomach discomfort and diarrhea. Critical care MD on call was paged and notified  Kendall Flack 03/04/2015 3:14 AM MD call back, aware, stated patient needs her Vancomycin, that the only medication that treats patient's infection. MD placed order for PRN Zofran to help with stomach discomfort.  Kendall Flack 03/04/2015 3:16 AM
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
## 2015-03-04 NOTE — Consult Note (Signed)
Reason for Consult:Hoarseness following extubation Referring Physician: CCM  Brittany [name] is an 50 y.o. female.  HPI: 50 year old female admitted 1/11 due to drug overdose and required intubation in ER for airway protection.  No mention of difficult intubation in notes.  She remained intubated for four days and was then extubated but required reintubation the same day due to respiratory distress.  She was extubated again two days later, two days ago.  Since then, her voice has been severely hoarse with some throat soreness and she has had difficulty swallowing which seems to be improving.  She is currently taking a dysphagia 2 diet.  Past Medical History  Diagnosis Date  . Hypertension   . Bipolar 1 disorder ([place])   . Anxiety   . Back pain   . Bulging disc   . Ruptured intervertebral disc   . Migraine   . Head injury   . Short-term memory loss   . Depression   . Hypothyroidism   . Goiter   . Facial pain   . Facial nerve injury     Past Surgical History  Procedure Laterality Date  . Abdominal hysterectomy      Family History  Problem Relation Age of Onset  . Migraines Mother   . Migraines Father   . Other Father   . Migraines Sister     Social History:  reports that she has never smoked. She has never used smokeless tobacco. She reports that she uses illicit drugs (Marijuana). She reports that she does not drink alcohol.  Allergies:  Allergies  Allergen Reactions  . Toradol [Ketorolac Tromethamine] Swelling  . Suboxone [Buprenorphine Hcl-Naloxone Hcl]     cellulitis   . Tegretol [Carbamazepine] Rash  . Trazodone And Nefazodone Other (See Comments)    Bladder infection    Medications: I have reviewed the patient's current medications.  Results for orders placed or performed during the hospital encounter of 02/24/15 (from the past 48 hour(s))  Glucose, capillary     Status: Abnormal   Collection Time: 03/02/15  8:39 PM  Result Value Ref Range    Glucose-Capillary 131 (H) 65 - 99 mg/dL   Comment 1 Capillary Specimen    Comment 2 Notify RN   Glucose, capillary     Status: Abnormal   Collection Time: 03/02/15 11:17 PM  Result Value Ref Range   Glucose-Capillary 128 (H) 65 - 99 mg/dL   Comment 1 Capillary Specimen    Comment 2 Notify RN   Glucose, capillary     Status: Abnormal   Collection Time: 03/03/15  4:05 AM  Result Value Ref Range   Glucose-Capillary 113 (H) 65 - 99 mg/dL   Comment 1 Capillary Specimen   Basic metabolic panel     Status: Abnormal   Collection Time: 03/03/15  4:15 AM  Result Value Ref Range   Sodium 144 135 - 145 mmol/L   Potassium 3.8 3.5 - 5.1 mmol/L   Chloride 106 101 - 111 mmol/L   CO2 25 22 - 32 mmol/L   Glucose, Bld 119 (H) 65 - 99 mg/dL   BUN 22 (H) 6 - 20 mg/dL   Creatinine, Ser 0.55 0.44 - 1.00 mg/dL   Calcium 9.5 8.9 - 10.3 mg/dL   GFR calc non Af Amer >60 >60 mL/min   GFR calc Af Amer >60 >60 mL/min    Comment: (NOTE) The eGFR has been calculated using the CKD EPI equation. This calculation has not been validated in all clinical  situations. eGFR's persistently <60 mL/min signify possible Chronic Kidney Disease.    Anion gap 13 5 - 15  CBC with Differential/Platelet     Status: Abnormal   Collection Time: 03/03/15  4:15 AM  Result Value Ref Range   WBC 12.3 (H) 4.0 - 10.5 K/uL   RBC 3.86 (L) 3.87 - 5.11 MIL/uL   Hemoglobin 12.4 12.0 - 15.0 g/dL   HCT 37.7 36.0 - 46.0 %   MCV 97.7 78.0 - 100.0 fL   MCH 32.1 26.0 - 34.0 pg   MCHC 32.9 30.0 - 36.0 g/dL   RDW 13.6 11.5 - 15.5 %   Platelets 329 150 - 400 K/uL   Neutrophils Relative % 82 %   Neutro Abs 10.1 (H) 1.7 - 7.7 K/uL   Lymphocytes Relative 11 %   Lymphs Abs 1.4 0.7 - 4.0 K/uL   Monocytes Relative 6 %   Monocytes Absolute 0.8 0.1 - 1.0 K/uL   Eosinophils Relative 0 %   Eosinophils Absolute 0.0 0.0 - 0.7 K/uL   Basophils Relative 0 %   Basophils Absolute 0.1 0.0 - 0.1 K/uL  Magnesium     Status: None   Collection Time:  03/03/15  4:15 AM  Result Value Ref Range   Magnesium 2.3 1.7 - 2.4 mg/dL  Phosphorus     Status: None   Collection Time: 03/03/15  4:15 AM  Result Value Ref Range   Phosphorus 3.7 2.5 - 4.6 mg/dL  Glucose, capillary     Status: Abnormal   Collection Time: 03/03/15  8:37 AM  Result Value Ref Range   Glucose-Capillary 106 (H) 65 - 99 mg/dL  Triglycerides     Status: Abnormal   Collection Time: 03/03/15 11:33 AM  Result Value Ref Range   Triglycerides 292 (H) <150 mg/dL  Glucose, capillary     Status: None   Collection Time: 03/03/15 11:43 AM  Result Value Ref Range   Glucose-Capillary 93 65 - 99 mg/dL  Culture, blood (routine x 2)     Status: None (Preliminary result)   Collection Time: 03/04/15 11:27 AM  Result Value Ref Range   Specimen Description BLOOD LEFT ANTECUBITAL    Special Requests IN PEDIATRIC BOTTLE 3CC    Culture PENDING    Report Status PENDING   Culture, blood (routine x 2)     Status: None (Preliminary result)   Collection Time: 03/04/15  1:45 PM  Result Value Ref Range   Specimen Description BLOOD LEFT ANTECUBITAL    Special Requests      BOTTLES DRAWN AEROBIC AND ANAEROBIC 5CC AER 4CC ANA   Culture PENDING    Report Status PENDING     Dg Swallowing Func-speech Pathology  03/03/2015  Objective Swallowing Evaluation:   MBS Patient Details Name: Brittany [name] MRN: [id number] Date of Birth: 21-Feb-1965 Today's Date: 03/03/2015 Time: SLP Start Time (ACUTE ONLY): 1005-SLP Stop Time (ACUTE ONLY): 1020 SLP Time Calculation (min) (ACUTE ONLY): 15 min Past Medical History: Past Medical History Diagnosis Date . Hypertension  . Bipolar 1 disorder ([place])  . Anxiety  . Back pain  . Bulging disc  . Ruptured intervertebral disc  . Migraine  . Head injury  . Short-term memory loss  . Depression  . Hypothyroidism  . Goiter  . Facial pain  . Facial nerve injury  Past Surgical History: Past Surgical History Procedure Laterality Date . Abdominal hysterectomy   HPI: Patient is a  50 year old female with a past medical history of hypertension, hypothyroidism, depression,  bipolar disorder, anxiety, and history of IV drug use presenting with overdose (10 pills from her baclofen bottle and possibly 50 pills from her nortriptyline bottle). Intubated 1/11-1/17. CXR improving basilar atelectasis. No Data Recorded Assessment / Plan / Recommendation CHL IP CLINICAL IMPRESSIONS 03/03/2015 Therapy Diagnosis Moderate pharyngeal phase dysphagia;Severe pharyngeal phase dysphagia;Mild oral phase dysphagia;Moderate oral phase dysphagia Clinical Impression Pt demonstrated a mild-mod oral deficits with reduced propulsion of bolus resulting in consistent anterior-posterior movement and delayed transit of bolus. Moderate-severe pharyngeal dysphagia likely related to 6 day intubation. Frank aspiration during the swallow with honey and nectar thick barium due to decreased laryngeal elevation and laryngeal closure with delayed cough (3-6 seconds). Compensatory strategies not attempted due suspicion of poor ability to consistently implement. No pharyngeal residue present. Prognosis for improvement is good. Recommend Dys 1 texture and pudding thick liquids (nothing thinner than pudding).  Impact on safety and function Severe aspiration risk   CHL IP TREATMENT RECOMMENDATION 03/03/2015 Treatment Recommendations Therapy as outlined in treatment plan below   Prognosis 03/03/2015 Prognosis for Safe Diet Advancement Good Barriers to Reach Goals Other (Comment) Barriers/Prognosis Comment -- CHL IP DIET RECOMMENDATION 03/03/2015 SLP Diet Recommendations Dysphagia 1 (Puree) solids;Pudding thick liquid Liquid Administration via Spoon Medication Administration Crushed with puree Compensations Small sips/bites;Slow rate Postural Changes Remain semi-upright after after feeds/meals (Comment)   CHL IP OTHER RECOMMENDATIONS 03/03/2015 Recommended Consults -- Oral Care Recommendations Oral care BID Other Recommendations --   CHL IP  FOLLOW UP RECOMMENDATIONS 03/03/2015 Follow up Recommendations (No Data)   CHL IP FREQUENCY AND DURATION 03/03/2015 Speech Therapy Frequency (ACUTE ONLY) min 2x/week Treatment Duration 2 weeks      CHL IP ORAL PHASE 03/03/2015 Oral Phase Impaired Oral - Pudding Teaspoon -- Oral - Pudding Cup -- Oral - Honey Teaspoon -- Oral - Honey Cup Reduced posterior propulsion Oral - Nectar Teaspoon -- Oral - Nectar Cup Reduced posterior propulsion Oral - Nectar Straw -- Oral - Thin Teaspoon -- Oral - Thin Cup -- Oral - Thin Straw -- Oral - Puree Reduced posterior propulsion Oral - Mech Soft -- Oral - Regular -- Oral - Multi-Consistency -- Oral - Pill -- Oral Phase - Comment --  CHL IP PHARYNGEAL PHASE 03/03/2015 Pharyngeal Phase Impaired Pharyngeal- Pudding Teaspoon -- Pharyngeal -- Pharyngeal- Pudding Cup -- Pharyngeal -- Pharyngeal- Honey Teaspoon -- Pharyngeal -- Pharyngeal- Honey Cup Penetration/Aspiration during swallow;Reduced laryngeal elevation;Reduced airway/laryngeal closure Pharyngeal Material enters airway, passes BELOW cords and not ejected out despite cough attempt by patient Pharyngeal- Nectar Teaspoon -- Pharyngeal -- Pharyngeal- Nectar Cup Penetration/Aspiration during swallow;Reduced laryngeal elevation;Reduced airway/laryngeal closure Pharyngeal Material enters airway, passes BELOW cords and not ejected out despite cough attempt by patient Pharyngeal- Nectar Straw -- Pharyngeal -- Pharyngeal- Thin Teaspoon -- Pharyngeal -- Pharyngeal- Thin Cup -- Pharyngeal -- Pharyngeal- Thin Straw -- Pharyngeal -- Pharyngeal- Puree WFL Pharyngeal -- Pharyngeal- Mechanical Soft -- Pharyngeal -- Pharyngeal- Regular -- Pharyngeal -- Pharyngeal- Multi-consistency -- Pharyngeal -- Pharyngeal- Pill -- Pharyngeal -- Pharyngeal Comment --  CHL IP CERVICAL ESOPHAGEAL PHASE 03/03/2015 Cervical Esophageal Phase WFL Pudding Teaspoon -- Pudding Cup -- Honey Teaspoon -- Honey Cup -- Nectar Teaspoon -- Nectar Cup -- Nectar Straw -- Thin  Teaspoon -- Thin Cup -- Thin Straw -- Puree -- Mechanical Soft -- Regular -- Multi-consistency -- Pill -- Cervical Esophageal Comment -- No flowsheet data found. [place] Siren 03/03/2015, 2:43 PM  Orbie Pyo Colvin Caroli.Ed [employer] [phone]  Review of Systems  HENT: Positive for sore throat.   All other systems reviewed and are negative.  Blood pressure 145/85, pulse 59, temperature 98.2 F (36.8 C), temperature source Oral, resp. rate 19, height 5' 4"  (1.626 m), weight 101.8 kg (224 lb 6.9 oz), SpO2 98 %. Physical Exam  Constitutional: She is oriented to person, place, and time. She appears well-developed and well-nourished. No distress.  HENT:  Head: Normocephalic and atraumatic.  Right Ear: External ear normal.  Left Ear: External ear normal.  Nose: Nose normal.  Mouth/Throat: Oropharynx is clear and moist.  Aphonic with poor cough.  Eyes: Conjunctivae and EOM are normal. Pupils are equal, round, and reactive to light.  Neck: Normal range of motion. Neck supple.  Cardiovascular: Normal rate.   Respiratory: Effort normal.  Musculoskeletal: Normal range of motion.  Neurological: She is alert and oriented to person, place, and time. No cranial nerve deficit.  Skin: Skin is warm and dry.  Psychiatric: She has a normal mood and affect. Her behavior is normal. Judgment and thought content normal.    Assessment/Plan: Laryngeal edema, dysphonia, dysphagia post-extubation Fiberoptic exam of the larynx was performed at the bedside.  See procedure note.  There remains significant inflammation of the glottis and posterior commissure from intubation that is limiting vocal fold movement and should improve with time.  I recommend treating with BID omeprazole and will have her follow-up as an outpatient.  Jeanell Mangan 03/04/2015, 5:22 PM

## 2015-03-04 NOTE — Progress Notes (Signed)
Speech Language Pathology Treatment: Dysphagia  Patient Details Name: Brittany [name] MRN: [id number] DOB: [birth year] Today's Date: 03/04/2015 Time: 4098-1191 SLP Time Calculation (min) (ACUTE ONLY): 15 min  Assessment / Plan / Recommendation Clinical Impression  Pt demonstrates improved function subjectively today with upgraded trials of solids and liquids. Pt is sitting edge of bed, alert and attentive, timely swallow, no oral pumping, following commands. SLP provided several trials of ice chips and then 3 oz of honey thick liquids and puree, with no coughing or throat clearing throughout session. The pt was documented to have had late sensation of aspiration on MBS, so suspect function has improved, perhaps with improving mentation. Vocal quality is still quite hoarse, so suspect airway protection still impaired to some degree. Will proceed cautiously with an upgrade to Dys 2/honey thick liquids and check for tolerance. Pt in agreement.    HPI HPI: Patient is a 50 year old female with a past medical history of hypertension, hypothyroidism, depression, bipolar disorder, anxiety, and history of IV drug use presenting with overdose (10 pills from her baclofen bottle and possibly 50 pills from her nortriptyline bottle). Intubated 1/11-1/17. CXR improving basilar atelectasis.      SLP Plan  Continue with current plan of care     Recommendations  Diet recommendations: Dysphagia 2 (fine chop);Honey-thick liquid Liquids provided via: Cup Medication Administration: Crushed with puree Supervision: Patient able to self feed Compensations: Slow rate;Small sips/bites Postural Changes and/or Swallow Maneuvers: Seated upright 90 degrees             Plan: Continue with current plan of care     GO               Brittany Ditty, MA CCC-SLP [phone]  Brittany [name] 03/04/2015, 12:20 PM

## 2015-03-04 NOTE — Evaluation (Signed)
Occupational Therapy Evaluation Patient Details Name: Brittany [name] MRN: [id number] DOB: Aug 24, 1965 Today's Date: 03/04/2015    History of Present Illness Patient is a 50 year old female with a past medical history of hypertension, hypothyroidism, depression, bipolar disorder, anxiety, and history of IV drug use presenting with overdose (10 pills from her baclofen bottle and possibly 50 pills from her nortriptyline bottle). Intubated 1/11-1/17. CXR improving basilar atelectasis.    Clinical Impression   Patient presenting with decreased ADL and functional mobility independence secondary to above. Patient independent PTA. Patient currently functioning at an overall min assist level. Patient will benefit from acute OT to increase overall independence in the areas of ADLs, functional mobility, and overall safety in order to safely discharge to inpatient psych facility vs home, whatever team finds appropriate.     Follow Up Recommendations  No OT follow up;Supervision/Assistance - 24 hour (inpatient psych facility)    Equipment Recommendations  3 in 1 bedside comode    Recommendations for Other Services  None at this time    Precautions / Restrictions Precautions Precautions: Fall Restrictions Weight Bearing Restrictions: No    Mobility Bed Mobility Overal bed mobility: Needs Assistance Bed Mobility: Sit to Supine;Supine to Sit     Supine to sit: Min assist Sit to supine: Supervision   General bed mobility comments: Pt able to sit up (impulsive with min assist). Sit to supine quick (due to irritability) and with supervision.   Transfers Overall transfer level: Needs assistance Equipment used: None Transfers: Sit to/from [employer] Sit to Stand: Min guard Stand pivot transfers: Min guard       General transfer comment: Min guard for safety, pt impuslive and completed sit to/from stand and stand pivot transfer in quick manner    Balance Overall balance  assessment: Needs assistance Sitting-balance support: No upper extremity supported;Feet supported Sitting balance-Leahy Scale: Fair     Standing balance support: No upper extremity supported;During functional activity Standing balance-Leahy Scale: Fair    ADL Overall ADL's : Needs assistance/impaired  General ADL Comments: Pt overall min assist for ADLs and functional mobility, +2 helpful for safety. Pt limited by psych history and requires increased encouragement.     Vision Additional Comments: Unsure, pt wide eyed during session. Left eye didn't seem to track or follow. Pt unable to state anything was wrong or if vision was different.           Pertinent Vitals/Pain Pain Assessment: Faces Faces Pain Scale: Hurts whole lot Pain Location: right hand - IV site Pain Descriptors / Indicators: Aching;Sore;Grimacing Pain Intervention(s): Limited activity within patient's tolerance;Monitored during session;Other (comment) (notified RN)     Hand Dominance Right   Extremity/Trunk Assessment Upper Extremity Assessment Upper Extremity Assessment: Generalized weakness (difficult to fully assess secondary to pain in right hand from IV site)   Lower Extremity Assessment Lower Extremity Assessment: Generalized weakness   Cervical / Trunk Assessment Cervical / Trunk Assessment: Normal   Communication Communication Communication:  (hoarse speech)   Cognition Arousal/Alertness: Awake/alert Behavior During Therapy: Anxious;Impulsive Overall Cognitive Status: History of cognitive impairments - at baseline Area of Impairment: Following commands;Safety/judgement;Problem solving;Awareness       Following Commands: Follows one step commands consistently Safety/Judgement: Decreased awareness of safety;Decreased awareness of deficits   Problem Solving: Difficulty sequencing;Requires verbal cues;Slow processing;Decreased initiation General Comments: Pt with extensive psych history.  Very  childlike behavior.  Pain in right hand causing increased anxiety - Pt did feel bad for not being able to participate much in  therapy.               Home Living Family/patient expects to be discharged to:: Private residence Living Arrangements: Spouse/significant other Available Help at Discharge: Friend(s);Family;Available 24 hours/day Type of Home: House Home Access: Stairs to enter [employer] of Steps: 4 Entrance Stairs-Rails: Right Home Layout: One level     Bathroom Shower/Tub: Walk-in shower;Door   [employer] Toilet: Standard Bathroom Accessibility: Yes   Home Equipment: Shower seat          Prior Functioning/Environment Level of Independence: Independent     OT Diagnosis: Generalized weakness;Acute pain   OT Problem List: Decreased strength;Decreased activity tolerance;Impaired balance (sitting and/or standing);Decreased safety awareness;Decreased knowledge of use of DME or AE;Pain   OT Treatment/Interventions: Self-care/ADL training;Energy conservation;DME and/or AE instruction;Therapeutic activities;Patient/family education;Balance training    OT Goals(Current goals can be found in the care plan section) Acute Rehab OT Goals Patient Stated Goal: none stated OT Goal Formulation: Patient unable to participate in goal setting Time For Goal Achievement: 03/18/15 Potential to Achieve Goals: Fair ADL Goals Pt Will Perform Grooming: Independently;standing Pt Will Perform Lower Body Bathing: Independently;sit to/from stand Pt Will Perform Lower Body Dressing: Independently;sit to/from stand Pt Will Transfer to Toilet: ambulating;with modified independence;bedside commode Additional ADL Goal #1: Pt will be mod I with functional mobility using LRAD prn  OT Frequency: Min 2X/week   Barriers to D/C:    None known at this time   End of Session Nurse Communication: Other (comment) (patient's complaints of pain in right hand, IV site)  Activity Tolerance:  Other (comment) (limited by anxiety and pain in right hand) Patient left: in bed;with call bell/phone within reach;with nursing/sitter in room   Time: 1425-1438 OT Time Calculation (min): 13 min Charges:  OT General Charges $OT Visit: 1 Procedure OT Evaluation $OT Eval Moderate Complexity: 1 Procedure  Edwin Cap , MS, OTR/L, CLT Pager: [phone]  03/04/2015, 2:52 PM

## 2015-03-04 NOTE — Clinical Social Work Psych Note (Signed)
Psych CSW received consult for intentional overdose.  Patient is now extubated and on a medical unit.    Disposition: Inpatient psychiatric hospitalization at time of discharge.  Barriers: patient ambulates with rolling walker, will need 2 weeks IV abx at time of discharge and currently on a dysphasia II diet.    Psych CSW has asked for guidance from the Medical Director.  Typically the above named barriers are exclusionary factors for inpatient psychiatric facilities.  Psych CSW will continue to follow and assist with disposition needs.  Vickii Penna, LCSW [phone]  5N1-9; 2S 15-16 and Hospital [facility] Service Line Licensed Clinical Social Worker

## 2015-03-04 NOTE — Plan of Care (Deleted)
Problem: Activity: Goal: Risk for activity intolerance will decrease Outcome: Not Progressing Patient is paralyzed and bedbound as baseline

## 2015-03-04 NOTE — Procedures (Signed)
Preop diagnosis: Dysphonia, dysphagia Postop diagnosis: same Procedure: Transnasal fiberoptic laryngoscopy Surgeon: Jenne Pane Anesth: None Comp: None Findings: There are inflammatory changes involving the vocal folds, primarily posteriorly, with posterior commissure edema.  Edema is not obstructing but limits glottal movement, particularly adduction, resulting in poor glottic closure with phonation. Description: After discussing risks, benefits, and alternatives, she was placed in a seated position.  The fiberoptic laryngoscope was gently passed through the right nasal passage and used to view the pharynx and larynx.  After completion, the scope was removed and she was returned to nursing care in stable condition.

## 2015-03-04 NOTE — Plan of Care (Deleted)
Problem: Nutrition: Goal: Adequate nutrition will be maintained Outcome: Progressing Patient has continuous tube feeding

## 2015-03-04 NOTE — Progress Notes (Signed)
Patient refused her scheduled Vancomycin and Protonix, very agitated and upset, stated "I don't want it, it hurts my hand and my throat." RN explained to patient the importance of taking her antibiotics, patient verbalized understand, stated "I'm fine, I don't need it." Patient denies pain, no other needs expressed at the moment.

## 2015-03-04 NOTE — Progress Notes (Signed)
PULMONARY / CRITICAL CARE MEDICINE   Name: Brittany [name] MRN: [id number] DOB: [birth year]    ADMISSION DATE:  02/24/2015 CONSULTATION DATE:  02/24/15  REFERRING MD :  ED PRIMARY SERVICE: Critical Care   CHIEF COMPLAINT:  Altered mental status  HISTORY OF PRESENT ILLNESS:  Patient is currently on vent and under sedation, history was obtained from emergency room documentation. Patient is a 50 year old female with a past medical history of hypertension, hypothyroidism, depression, bipolar disorder, anxiety, and history of IV drug use presenting OD with 10 pills from her baclofen bottle and possibly 50 pills from her nortriptyline bottle. Was intubated in ICU, now on tele. Having some hoarseness to her voice. PCCM re-consulted for further eval.  SUBJECTIVE:   Feeling better, denies SOB, denies throat tightness/pain  VITAL SIGNS: Temp:  [98.6 F (37 C)-99 F (37.2 C)] 98.6 F (37 C) (01/19 0543) Pulse Rate:  [67-92] 69 (01/19 0857) Resp:  [13-18] 18 (01/19 0543) BP: (98-150)/(68-109) 136/92 mmHg (01/19 0857) SpO2:  [93 %-98 %] 98 % (01/19 0543) Weight:  [101.8 kg (224 lb 6.9 oz)] 101.8 kg (224 lb 6.9 oz) (01/18 1647)   HEMODYNAMICS:   VENTILATOR SETTINGS:     INTAKE / OUTPUT: Intake/Output      01/18 0701 - 01/19 0700 01/19 0701 - 01/20 0700   P.O. 240 120   I.V. (mL/kg)     NG/GT     IV Piggyback 500    Total Intake(mL/kg) 740 (7.3) 120 (1.2)   Urine (mL/kg/hr) 350 (0.1)    Stool 0 (0)    Total Output 350     Net +390 +120        Urine Occurrence 5 x 2 x   Stool Occurrence 3 x 1 x     PHYSICAL EXAMINATION: General:  Obese woman resting comfortably in bed Neuro: Alert, oriented, non-focal. Follows commands all 4 extremities HEENT: PERRL. Dysconjugate gaze. Moist mucus membranes Cardiovascular:  RRR, no murmurs, gallops, or rubs.  Lungs: Breath sounds equal and clear, no wheezes, rhonchi, or rales. Upper airway sounds clear Abdomen: Soft, non-tender, non-distended.  BS + Musculoskeletal:  No edema Skin:  Warm and dry  LABS:  CBC  Recent Labs Lab 03/01/15 0316 03/02/15 0340 03/03/15 0415  WBC 10.6* 13.0* 12.3*  HGB 10.6* 11.6* 12.4  HCT 32.5* 35.2* 37.7  PLT 244 313 329   Coag's No results for input(s): APTT, INR in the last 168 hours. BMET  Recent Labs Lab 03/01/15 0316 03/02/15 0340 03/03/15 0415  NA 138 143 144  K 4.4 4.2 3.8  CL 103 104 106  CO2 BUN 12 28* 22*  CREATININE 0.53 0.62 0.55  GLUCOSE 157* 149* 119*   Electrolytes  Recent Labs Lab 03/01/15 0316 03/02/15 0340 03/03/15 0415  CALCIUM 8.9 9.4 9.5  MG 2.0 2.5* 2.3  PHOS 3.6 3.1 3.7      ABG  Recent Labs Lab 02/26/15 0341 02/28/15 1225 03/02/15 0509  PHART 7.413 7.365 7.441  PCO2ART 52.1* 48.7* 40.9  PO2ART 70.0* 92.6 103*   Liver Enzymes  Recent Labs Lab 02/26/15 0305 02/27/15 0006  AST 37 46*  ALT 30 36  ALKPHOS 65 70  BILITOT 0.6 0.8  ALBUMIN 3.3* 3.0*    Glucose  Recent Labs Lab 03/02/15 1611 03/02/15 2039 03/02/15 2317 03/03/15 0405 03/03/15 0837 03/03/15 1143  GLUCAP 108* 131* 128* 113* 106* 93   Imaging Dg Swallowing Func-speech Pathology  03/03/2015  Objective Swallowing Evaluation:   MBS  Patient Details Name: Brittany [name] MRN: [id number] Date of Birth: [birth year] Today's Date: 03/03/2015 Time: SLP Start Time (ACUTE ONLY): 1005-SLP Stop Time (ACUTE ONLY): 1020 SLP Time Calculation (min) (ACUTE ONLY): 15 min Past Medical History: Past Medical History Diagnosis Date . Hypertension  . Bipolar 1 disorder (HCC)  . Anxiety  . Back pain  . Bulging disc  . Ruptured intervertebral disc  . Migraine  . Head injury  . Short-term memory loss  . Depression  . Hypothyroidism  . Goiter  . Facial pain  . Facial nerve injury  Past Surgical History: Past Surgical History Procedure Laterality Date . Abdominal hysterectomy   HPI: Patient is a 50 year old female with a past medical history of hypertension, hypothyroidism, depression,  bipolar disorder, anxiety, and history of IV drug use presenting with overdose (10 pills from her baclofen bottle and possibly 50 pills from her nortriptyline bottle). Intubated 1/11-1/17. CXR improving basilar atelectasis. No Data Recorded Assessment / Plan / Recommendation CHL IP CLINICAL IMPRESSIONS 03/03/2015 Therapy Diagnosis Moderate pharyngeal phase dysphagia;Severe pharyngeal phase dysphagia;Mild oral phase dysphagia;Moderate oral phase dysphagia Clinical Impression Pt demonstrated a mild-mod oral deficits with reduced propulsion of bolus resulting in consistent anterior-posterior movement and delayed transit of bolus. Moderate-severe pharyngeal dysphagia likely related to 6 day intubation. Frank aspiration during the swallow with honey and nectar thick barium due to decreased laryngeal elevation and laryngeal closure with delayed cough (3-6 seconds). Compensatory strategies not attempted due suspicion of poor ability to consistently implement. No pharyngeal residue present. Prognosis for improvement is good. Recommend Dys 1 texture and pudding thick liquids (nothing thinner than pudding).  Impact on safety and function Severe aspiration risk   CHL IP TREATMENT RECOMMENDATION 03/03/2015 Treatment Recommendations Therapy as outlined in treatment plan below   Prognosis 03/03/2015 Prognosis for Safe Diet Advancement Good Barriers to Reach Goals Other (Comment) Barriers/Prognosis Comment -- CHL IP DIET RECOMMENDATION 03/03/2015 SLP Diet Recommendations Dysphagia 1 (Puree) solids;Pudding thick liquid Liquid Administration via Spoon Medication Administration Crushed with puree Compensations Small sips/bites;Slow rate Postural Changes Remain semi-upright after after feeds/meals (Comment)   CHL IP OTHER RECOMMENDATIONS 03/03/2015 Recommended Consults -- Oral Care Recommendations Oral care BID Other Recommendations --   CHL IP FOLLOW UP RECOMMENDATIONS 03/03/2015 Follow up Recommendations (No Data)   CHL IP FREQUENCY AND  DURATION 03/03/2015 Speech Therapy Frequency (ACUTE ONLY) min 2x/week Treatment Duration 2 weeks      CHL IP ORAL PHASE 03/03/2015 Oral Phase Impaired Oral - Pudding Teaspoon -- Oral - Pudding Cup -- Oral - Honey Teaspoon -- Oral - Honey Cup Reduced posterior propulsion Oral - Nectar Teaspoon -- Oral - Nectar Cup Reduced posterior propulsion Oral - Nectar Straw -- Oral - Thin Teaspoon -- Oral - Thin Cup -- Oral - Thin Straw -- Oral - Puree Reduced posterior propulsion Oral - Mech Soft -- Oral - Regular -- Oral - Multi-Consistency -- Oral - Pill -- Oral Phase - Comment --  CHL IP PHARYNGEAL PHASE 03/03/2015 Pharyngeal Phase Impaired Pharyngeal- Pudding Teaspoon -- Pharyngeal -- Pharyngeal- Pudding Cup -- Pharyngeal -- Pharyngeal- Honey Teaspoon -- Pharyngeal -- Pharyngeal- Honey Cup Penetration/Aspiration during swallow;Reduced laryngeal elevation;Reduced airway/laryngeal closure Pharyngeal Material enters airway, passes BELOW cords and not ejected out despite cough attempt by patient Pharyngeal- Nectar Teaspoon -- Pharyngeal -- Pharyngeal- Nectar Cup Penetration/Aspiration during swallow;Reduced laryngeal elevation;Reduced airway/laryngeal closure Pharyngeal Material enters airway, passes BELOW cords and not ejected out despite cough attempt by patient Pharyngeal- Nectar Straw -- Pharyngeal -- Pharyngeal- Thin Teaspoon -- Pharyngeal -- Pharyngeal-  Thin Cup -- Pharyngeal -- Pharyngeal- Thin Straw -- Pharyngeal -- Pharyngeal- Puree WFL Pharyngeal -- Pharyngeal- Mechanical Soft -- Pharyngeal -- Pharyngeal- Regular -- Pharyngeal -- Pharyngeal- Multi-consistency -- Pharyngeal -- Pharyngeal- Pill -- Pharyngeal -- Pharyngeal Comment --  CHL IP CERVICAL ESOPHAGEAL PHASE 03/03/2015 Cervical Esophageal Phase WFL Pudding Teaspoon -- Pudding Cup -- Honey Teaspoon -- Honey Cup -- Nectar Teaspoon -- Nectar Cup -- Nectar Straw -- Thin Teaspoon -- Thin Cup -- Thin Straw -- Puree -- Mechanical Soft -- Regular -- Multi-consistency --  Pill -- Cervical Esophageal Comment -- No flowsheet data found. 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 No stridor Dysphagia -Resume dexamethasone  q 6 hours -Recommend ENT evaluation for larygoscopy   Acute hypoxic/hypercarbic respiratory failure in setting of overdose > resolved  Basilar Atelectasis -Extubated 1/17 -Incentive spirometry per RT protocol  Bacteremia (coag neg staph) -Per primary and ID  Intentional overdose (baclofen, nortriptyline)  -Management per primary team  Joneen Roach, AGACNP-BC [facility] Pager [phone] or [phone]  03/04/2015 2:09 PM     STAFF NOTE: Cindi Carbon, MD FACP have personally reviewed patient's available data, including medical history, events of note, physical examination and test results as part of my evaluation. I have discussed with resident/NP and other care providers such as pharmacist, RN and RRT. In addition, I personally evaluated patient and elicited key findings of:  No distress, she phonates but is horse, she has NO stridor or upper airway sounds, this is trauma from self extubation to cords, would recommend decadron x 48 hr then reduce, if at the end of next 72 hr not improving then ent assessment, add chloraseptic  spray for pain, monitor glu with steroids, if she continues to have pain with swallow will need GI consult, ppi consider   Mcarthur Rossetti. Tyson Alias, MD, FACP Pgr: [phone] [facility] 03/04/2015 4:16 PM

## 2015-03-05 DIAGNOSIS — F314 Bipolar disorder, current episode depressed, severe, without psychotic features: Secondary | ICD-10-CM

## 2015-03-05 DIAGNOSIS — R7881 Bacteremia: Secondary | ICD-10-CM

## 2015-03-05 DIAGNOSIS — T428X2A Poisoning by antiparkinsonism drugs and other central muscle-tone depressants, intentional self-harm, initial encounter: Secondary | ICD-10-CM

## 2015-03-05 DIAGNOSIS — T1491 Suicide attempt: Secondary | ICD-10-CM

## 2015-03-05 LAB — VANCOMYCIN, TROUGH: Vancomycin Tr: 7 ug/mL — ABNORMAL LOW (ref 10.0–20.0)

## 2015-03-05 MED ORDER — LORAZEPAM 2 MG/ML IJ SOLN
1.0000 mg | Freq: Once | INTRAMUSCULAR | Status: AC
  Administered 2015-03-05: 1 mg via INTRAVENOUS
  Filled 2015-03-05: qty 1

## 2015-03-05 NOTE — Progress Notes (Signed)
Physical Therapy Treatment Patient Details Name: Brittany [name] MRN: [id number] DOB: May 29, 1965 Today's Date: 03/05/2015    History of Present Illness Patient is a 50 year old female with a past medical history of hypertension, hypothyroidism, depression, bipolar disorder, anxiety, and history of IV drug use presenting with overdose (10 pills from her baclofen bottle and possibly 50 pills from her nortriptyline bottle). Intubated 1/11-1/17. CXR improving basilar atelectasis.     PT Comments    Pt progressing well with mobility.  Follow Up Recommendations  Other (comment) (inpatient psych)     Equipment Recommendations  Rolling walker with 5" wheels    Recommendations for Other Services       Precautions / Restrictions Precautions Precautions: Fall Restrictions Weight Bearing Restrictions: No    Mobility  Bed Mobility Overal bed mobility: Needs Assistance Bed Mobility: Sit to Supine;Supine to Sit     Supine to sit: Min assist     General bed mobility comments: Assist to scoot hips to EOB  Transfers Overall transfer level: Needs assistance Equipment used: Rolling walker (2 wheeled) Transfers: Sit to/from Stand Sit to Stand: Min guard         General transfer comment: Assist for safety  Ambulation/Gait Ambulation/Gait assistance: Min assist Ambulation Distance (Feet): 140 Feet (140' x 1, 30' x 1) Assistive device: Rolling walker (2 wheeled) Gait Pattern/deviations: Step-through pattern;Decreased step length - right;Decreased step length - left;Shuffle Gait velocity: decr Gait velocity interpretation: Below normal speed for age/gender General Gait Details: After amb initial 25' pt began repeatedly flexing knees like they were going to buckle. Suspect this was a functional type deficit rather than a weakness. After sitting and resting pt able to amb 140' without incidence.   Stairs            Wheelchair Mobility    Modified Rankin (Stroke Patients  Only)       Balance Overall balance assessment: Needs assistance Sitting-balance support: No upper extremity supported;Feet supported Sitting balance-Leahy Scale: Good     Standing balance support: Bilateral upper extremity supported Standing balance-Leahy Scale: Poor Standing balance comment: walker and supervision for static standing                    Cognition Arousal/Alertness: Awake/alert Behavior During Therapy: Anxious Overall Cognitive Status: History of cognitive impairments - at baseline Area of Impairment: Following commands;Safety/judgement;Problem solving;Awareness       Following Commands: Follows one step commands consistently Safety/Judgement: Decreased awareness of safety;Decreased awareness of deficits   Problem Solving: Slow processing General Comments: Pt worried about falling    Exercises      General Comments        Pertinent Vitals/Pain Pain Assessment: No/denies pain    Home Living                      Prior Function            PT Goals (current goals can now be found in the care plan section) Progress towards PT goals: Progressing toward goals    Frequency  Min 3X/week    PT Plan Current plan remains appropriate    Co-evaluation             End of Session Equipment Utilized During Treatment: Gait belt Activity Tolerance: Patient tolerated treatment well Patient left: in chair;with call bell/phone within reach;with nursing/sitter in room     Time: 1191-4782 PT Time Calculation (min) (ACUTE ONLY): 18 min  Charges:  $Gait Training: 8-22 mins  G Codes:      Chanson Teems 03/05/2015, 1:19 PM [employer] PT [phone]

## 2015-03-05 NOTE — Consult Note (Signed)
[facility] Face-to-Face Psychiatry Consult follow-up  Reason for Consult:  Intentional overdose Referring Physician:  Dr. Molli Knock Patient Identification: Brittany [name] MRN:  [id number] Principal Diagnosis: Drug overdose Diagnosis:   Patient Active Problem List   Diagnosis Date Noted  . Postextubation stridor [T85.89XA, R06.1]   . History of hepatitis C virus infection [Z86.19] 02/26/2015  . Acute respiratory failure (HCC) [J96.00]   . Altered mental status [R41.82]   . Endotracheally intubated [Z78.9]   . Acute encephalopathy [G93.40] 02/24/2015  . Drug overdose [T50.901A] 02/24/2015  . 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 Right facial pain [R51] 01/05/2015  . Orbit fracture (HCC) [S02.80XA] 11/30/2013  . Polypharmacy [Z79.899] 01/18/2012    Class: Chronic  . Bipolar affective (HCC) [F31.9] 01/18/2012  . Borderline personality disorder [F60.3] 01/18/2012  . Anxiety disorder [F41.9] 01/14/2012  . Pyelonephritis [N12] 09/16/2011  . Hypothyroidism [E03.9] 09/16/2011  . Anxiety [F41.9] 09/16/2011    Total Time spent with patient: 30 minutes  Subjective:   Brittany [name] is a 50 y.o. female patient admitted with intentional drug overdose.  HPI: Brittany [name] is a 50 years old female admitted to [facility] with intentional drug overdose and has bipolar disorder. Patient seen, chart reviewed and case discussed with the treatment team for face-to-face psychiatric consultation and evaluation of increased symptoms of depression and is intentional drug overdose reportedly taken 10 pills of baclofen and possibly 50 tabs of nortriptyline. Patient required intubation twice since was admitted to [facility]. Patient was intubated second time because of stridor. Patient stated that she has taken too many please because she's not feeling good and hoping to feel better but she end up becoming more sick. Patient denies active suicidal/homicidal ideation, intention or plans. Patient has no evidence  of auditory/visual hallucinations, paranoia and delusions. This evaluation is limited because patient talking with the low twice and sometimes hard to hear her effectively. Past Psychiatric History: Patient is known to the behavioral health Hospital in emergency department from her multiple visits over the years. Patient reportedly not seeing any psychiatrist but receiving her medications from the primary care physician. Patient used to see a psychiatrist at [facility] in the past .  Interval history: Patient seen face-to-face for psychiatric consultation follow-up today. Patient appeared lying in her bed, calm, awake, alert and oriented. Patient stated she is doing better with the supportive therapy and her current medication therapy. Patient reportedly has depression and anxiety rated them as 3-4 out of 10 today. Patient also reportedly feels regrets about intentional overdose of her medication. Patient understands she has been waiting to get medical clearance to be participating in acute inpatient psychiatric hospitalization for safety monitoring, crisis evaluation and medication management. Patient denies current suicidal/homicidal ideation and contracts for safety and in the hospital.   Risk to Self:   Risk to Others:   Prior Inpatient Therapy:   Prior Outpatient Therapy:    Past Medical History:  Past Medical History  Diagnosis Date  . Hypertension   . Bipolar 1 disorder (HCC)   . Anxiety   . Back pain   . Bulging disc   . Ruptured intervertebral disc   . Migraine   . Head injury   . Short-term memory loss   . Depression   . Hypothyroidism   . Goiter   . Facial pain   . Facial nerve injury     Past Surgical History  Procedure Laterality Date  . Abdominal hysterectomy     Family History:  Family History  Problem Relation Age of Onset  . Migraines Mother   . Migraines Father   . Other Father   . Migraines Sister    Family Psychiatric  History: Patient states that she lives  with her fianc who is a Runner, broadcasting/film/video. Patient has a daughter who does not live with her.  Social History:  History  Alcohol Use No     History  Drug Use  . Yes  . Special: Marijuana    Social History   Social History  . Marital Status: Legally Separated    Spouse Name: N/A  . Number of Children: 1  . Years of Education: 16   Occupational History  . Unemployed    Social History Main Topics  . Smoking status: Never Smoker   . Smokeless tobacco: Never Used  . Alcohol Use: No  . Drug Use: Yes    Special: Marijuana  . Sexual Activity: Yes    Birth Control/ Protection: Condom   Other Topics Concern  . None   Social History Narrative   Lives at home with her fiancee.   Right-handed.   No caffeine use.   Additional Social History:                          Allergies:   Allergies  Allergen Reactions  . Toradol [Ketorolac Tromethamine] Swelling  . Suboxone [Buprenorphine Hcl-Naloxone Hcl]     cellulitis   . Tegretol [Carbamazepine] Rash  . Trazodone And Nefazodone Other (See Comments)    Bladder infection    Labs:  Results for orders placed or performed during the hospital encounter of 02/24/15 (from the past 48 hour(s))  Triglycerides     Status: Abnormal   Collection Time: 03/03/15 11:33 AM  Result Value Ref Range   Triglycerides 292 (H) <150 mg/dL  Glucose, capillary     Status: None   Collection Time: 03/03/15 11:43 AM  Result Value Ref Range   Glucose-Capillary 93 65 - 99 mg/dL  Culture, blood (routine x 2)     Status: None (Preliminary result)   Collection Time: 03/04/15 11:27 AM  Result Value Ref Range   Specimen Description BLOOD LEFT ANTECUBITAL    Special Requests IN PEDIATRIC BOTTLE 3CC    Culture  Setup Time      GRAM POSITIVE COCCI IN CLUSTERS PEDIATRIC BOTTLE CRITICAL RESULT CALLED TO, READ BACK BY AND VERIFIED WITH: T. DONG,RN AT 1610 ON [id number] BY Lucienne Capers    Culture PENDING    Report Status PENDING   Culture, blood (routine x 2)      Status: None (Preliminary result)   Collection Time: 03/04/15  1:45 PM  Result Value Ref Range   Specimen Description BLOOD LEFT ANTECUBITAL    Special Requests      BOTTLES DRAWN AEROBIC AND ANAEROBIC 5CC AER 4CC ANA   Culture PENDING    Report Status PENDING   Vancomycin, trough     Status: Abnormal   Collection Time: 03/05/15  2:48 AM  Result Value Ref Range   Vancomycin Tr 7 (L) 10.0 - 20.0 ug/mL    Current Facility-Administered Medications  Medication Dose Route Frequency Provider Last Rate Last Dose  . 0.9 %  sodium chloride infusion  250 mL Intravenous PRN Otis Brace, MD 10 mL/hr at 03/01/15 2000 250 mL at 03/01/15 2000  . acetaminophen (TYLENOL) solution 650 mg  650 mg Oral Q6H PRN Coralyn Helling, MD   650 mg at 03/04/15 0136  .  antiseptic oral rinse solution (CORINZ)  7 mL Mouth Rinse QID Alyson Reedy, MD   7 mL at 03/04/15 0019  . bisacodyl (DULCOLAX) suppository 10 mg  10 mg Rectal Daily PRN Coralyn Helling, MD      . chlorhexidine gluconate (PERIDEX) 0.12 % solution 15 mL  15 mL Mouth Rinse BID Coralyn Helling, MD   15 mL at 03/03/15 0751  . dexamethasone (DECADRON) injection 6 mg  6 mg Intravenous 4 times per day Nelda Bucks, MD   6 mg at 03/05/15 0541  . DULoxetine (CYMBALTA) DR capsule 30 mg  30 mg Oral Daily Alyson Reedy, MD   30 mg at 03/05/15 0851  . enoxaparin (LOVENOX) injection 40 mg  40 mg Subcutaneous Daily Marjan Rabbani, MD   40 mg at 03/04/15 0853  . levothyroxine (SYNTHROID, LEVOTHROID) tablet 50 mcg  50 mcg Oral QAC breakfast Courtney Paris, MD   50 mcg at 03/05/15 0850  . lisinopril (PRINIVIL,ZESTRIL) tablet 10 mg  10 mg Oral Daily Courtney Paris, MD   10 mg at 03/05/15 0851  . multivitamin with minerals tablet 1 tablet  1 tablet Per Tube Daily Arlyss Gandy, RD   1 tablet at 03/05/15 0850  . ondansetron (ZOFRAN) injection 4 mg  4 mg Intravenous Q6H PRN Merwyn Katos, MD   4 mg at 03/04/15 1211  . pantoprazole (PROTONIX) EC tablet 40 mg  40 mg Oral BID  Christia Reading, MD   40 mg at 03/05/15 0850  . phenol (CHLORASEPTIC) mouth spray 1 spray  1 spray Mouth/Throat PRN Nelda Bucks, MD      . RESOURCE THICKENUP CLEAR   Oral PRN Alyson Reedy, MD      . sennosides (SENOKOT) 8.8 MG/5ML syrup 5 mL  5 mL Per Tube BID PRN Coralyn Helling, MD      . vancomycin (VANCOCIN) IVPB 1000 mg/200 mL premix  1,000 mg Intravenous Q8H Remi Haggard, RPH   1,000 mg at 03/04/15 1212    Musculoskeletal: Strength & Muscle Tone: decreased Gait & Station: unable to stand Patient leans: N/A  Psychiatric Specialty Exam: ROS   Blood pressure 135/87, pulse 78, temperature 97.8 F (36.6 C), temperature source Oral, resp. rate 18, height  (1.626 m), weight 101.8 kg (224 lb 6.9 oz), SpO2 95 %.Body mass index is 38.5 kg/(m^2).  General Appearance: Casual  Eye Contact::  Good  Speech:  Slow and soft voice  Volume:  Decreased  Mood:  Depressed  Affect:  Appropriate and Congruent  Thought Process:  Coherent and Goal Directed  Orientation:  Full (Time, Place, and Person)  Thought Content:  WDL  Suicidal Thoughts:  No  Homicidal Thoughts:  No  Memory:  Immediate;   Fair Recent;   Fair  Judgement:  Impaired  Insight:  Fair  Psychomotor Activity:  Decreased  Concentration:  Fair  Recall:  Good  Fund of Knowledge:Good  Language: Good  Akathisia:  Yes  Handed:  Right  AIMS (if indicated):     Assets:  Communication Skills Desire for Improvement Financial Resources/Insurance Housing Intimacy Leisure Time Resilience Social Support Transportation  ADL's:  Impaired  Cognition: WNL  Sleep:      Treatment Plan Summary: Daily contact with patient to assess and evaluate symptoms and progress in treatment and Medication management Continue current medication management without changes Patent attorney as patient has a status post intentional overdose and recent intubation twice Appreciate psychiatric consultation and follow  up as clinically  required Please contact [phone] or [phone] if needs further assistance Referred to the psychiatric social service for appropriate psychiatric placement when medically stable.   Disposition: Recommend psychiatric Inpatient admission when medically cleared. Supportive therapy provided about ongoing stressors.  Jameison Haji,JANARDHAHA R. 03/05/2015 9:31 AM

## 2015-03-05 NOTE — Progress Notes (Signed)
Occupational Therapy Treatment Patient Details Name: Brittany [name] MRN: [id number] DOB: October 27, 1965 Today's Date: 03/05/2015    History of present illness Patient is a 50 year old female with a past medical history of hypertension, hypothyroidism, depression, bipolar disorder, anxiety, and history of IV drug use presenting with overdose (10 pills from her baclofen bottle and possibly 50 pills from her nortriptyline bottle). Intubated 1/11-1/17. CXR improving basilar atelectasis.    OT comments  Pt has been routinely walking to bathroom with sitter with min assist. Pt distracted by being hungry. Poor awareness of safety with swallowing, attempting to get OT to avoid thickening milk.   Follow Up Recommendations  No OT follow up;Supervision/Assistance - 24 hour (inpatient psych)    Equipment Recommendations       Recommendations for Other Services      Precautions / Restrictions Precautions Precautions: Fall Restrictions Weight Bearing Restrictions: No       Mobility Bed Mobility Overal bed mobility: Needs Assistance Bed Mobility: Sit to Supine;Supine to Sit     Supine to sit: Min guard;HOB elevated Sit to supine: Supervision   General bed mobility comments: extra time and verbal cues for supine to sit  Transfers Overall transfer level: Needs assistance Equipment used: Rolling walker (2 wheeled) Transfers: Sit to/from Stand Sit to Stand: Min guard         General transfer comment: Assist for safety    Balance Overall balance assessment: Needs assistance Sitting-balance support: No upper extremity supported;Feet supported Sitting balance-Leahy Scale: Good     Standing balance support: Bilateral upper extremity supported Standing balance-Leahy Scale: Poor Standing balance comment: walker and supervision for static standing                   ADL Overall ADL's : Needs assistance/impaired Eating/Feeding: Set up;Bed level Eating/Feeding Details (indicate  cue type and reason): pt drank thickened milk upright in bed Grooming: Wash/dry hands;Min guard;Standing                   Toilet Transfer: Minimal assistance;Ambulation   Toileting- Clothing Manipulation and Hygiene: Minimal assistance;Sit to/from stand       Functional mobility during ADLs: Minimal assistance (hand held assist from bed<>bathroom, very slow gait)        Vision                     Perception     Praxis      Cognition   Behavior During Therapy: Anxious Overall Cognitive Status: History of cognitive impairments - at baseline Area of Impairment: Safety/judgement;Problem solving;Awareness        Following Commands: Follows one step commands consistently Safety/Judgement: Decreased awareness of safety;Decreased awareness of deficits   Problem Solving: Slow processing General Comments: pt anxious about falling, minimizes aspiration risk    Extremity/Trunk Assessment               Exercises     Shoulder Instructions       General Comments      Pertinent Vitals/ Pain       Pain Assessment: Faces Faces Pain Scale: Hurts a little bit Pain Location: R UE Pain Descriptors / Indicators: Grimacing Pain Intervention(s): Limited activity within patient's tolerance  Home Living                                          Prior Functioning/Environment  Frequency Min 2X/week     Progress Toward Goals  OT Goals(current goals can now be found in the care plan section)  Progress towards OT goals: Progressing toward goals  Acute Rehab OT Goals Patient Stated Goal: to eat  Plan Discharge plan remains appropriate    Co-evaluation                 End of Session Equipment Utilized During Treatment: Gait belt   Activity Tolerance     Patient Left in bed;with call bell/phone within reach;with nursing/sitter in room   Nurse Communication Other (comment) (ok to give pt thickened milk, aware  that pt didn't get lunch)        Time: 1610-9604 OT Time Calculation (min): 20 min  Charges: OT General Charges $OT Visit: 1 Procedure OT Treatments $Self Care/Home Management : 8-22 mins  Evern Bio 03/05/2015, 2:56 PM  [phone]

## 2015-03-05 NOTE — Progress Notes (Signed)
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 PM  Breck Coons Lonell Face.Ed CCC-SLP Pager [phone]               Jeoffrey Massed, MD  [facility] Pager:[phone]  If 7PM-7AM, please contact night-coverage [email or web address] Password [email or web address] 03/05/2015, 10:30 AM   LOS: 9 days

## 2015-03-05 NOTE — Progress Notes (Signed)
Patient was very upset after talking to her fiancee on the phone, RN came to patient's room, patient was standing, fidgeting, restless, and demanded to call to her fiancee. RN explained to patient that according to the hospital policy, patient can only make 3 phone calls a day, 5 minutes each, given that she is involuntarily committed and on suicide precautions. Patient verbalized understanding. Patient only had 1 phone call today so far, so this RN assisted patient to call her fiancee 2 times, but he did not pick up the phone. Patient then very upset, questioning RN "What did you tell him? Why did you lie to him? You're hurting that man feelings. You're lying. Get away from me. I don't even recognize you. You're trying to hurt me. Why are you treating me this way." It seemed that patient thought this RN is someone else and not her nurse. Patient was aggressive, verbally and physically. RN tried to calm her down, explained that RN did not talk to her fiancee or anyone on the phone about her, and tried to reorient her to the situation. Patient then sat in the recliner, quiet, yet still looking upset, but no other needs expressed. Sitter at bedside.   About 15-20 minutes later, patient walked out of her room and trying to leave, sitter by her side, RN called security. Patient stated "I just saw John (her fiancee). He's just walked by. Let me talk to him." Her fiancee was not on the unit at all. Patient was swinging her arms around, did not let anyone to touch her, cursing at staff, verbally and physically agressive. RN walked patient back to her room. Security came to the unit and talked to patient. She verbalized understanding that she cannot leave the hospital without MD order. Patient is back to her bed. Bed in lowest position, call bell within reach, sitter at bedside. On call M. Lynch MD paged and notified for medications to calm patient down.

## 2015-03-05 NOTE — Plan of Care (Signed)
Problem: Fluid Volume: Goal: Ability to maintain a balanced intake and output will improve Outcome: Progressing Patient was advanced to Dysphagia II diet with honey thick liquids  Problem: Nutrition: Goal: Adequate nutrition will be maintained Outcome: Progressing Patient was advanced to Dysphagia II diet with honey thick liquids

## 2015-03-05 NOTE — Progress Notes (Signed)
ANTIBIOTIC CONSULT NOTE   Pharmacy Consult for Vancomycin  Indication: CoNS Bacteremia  Labs:  Recent Labs  03/03/15 0415  WBC 12.3*  HGB 12.4  PLT 329  CREATININE 0.55   Estimated Creatinine Clearance: 100.9 mL/min (by C-G formula based on Cr of 0.55).  Recent Labs  03/05/15 0248  VANCOTROUGH 7*    Assessment: Vancomycin trough is low this AM due to patient refusing her vancomycin that was due 1/19 at 2000, it also appears to she has refused the 0400 dose of vancomycin this AM as well.   Goal of Therapy:  Vancomycin trough level 15-20 mcg/ml  Plan:  -Unable to make any changes to current dose with missing doses -Continue vancomycin 1000 mg IV q8h for now, hopefully patient will resume doses  Abran Duke 03/05/2015,3:46 AM

## 2015-03-06 LAB — CULTURE, BLOOD (ROUTINE X 2)

## 2015-03-06 LAB — VANCOMYCIN, TROUGH: Vancomycin Tr: 17 ug/mL (ref 10.0–20.0)

## 2015-03-06 LAB — CREATININE, SERUM
CREATININE: 0.8 mg/dL (ref 0.44–1.00)
GFR calc Af Amer: >60 mL/min (ref >60)

## 2015-03-06 MED ORDER — DEXAMETHASONE SODIUM PHOSPHATE 4 MG/ML IJ SOLN
4.0000 mg | Freq: Three times a day (TID) | INTRAMUSCULAR | Status: AC
  Administered 2015-03-06 – 2015-03-07 (×3): 4 mg via INTRAVENOUS
  Filled 2015-03-06 (×4): qty 1

## 2015-03-06 MED ORDER — GABAPENTIN 400 MG PO CAPS
400.0000 mg | ORAL_CAPSULE | Freq: Three times a day (TID) | ORAL | Status: DC
  Administered 2015-03-06 – 2015-03-10 (×14): 400 mg via ORAL
  Filled 2015-03-06 (×14): qty 1

## 2015-03-06 NOTE — Progress Notes (Signed)
IV Ativan given. Patient is resting in bed at the moment, no other needs expressed.

## 2015-03-06 NOTE — Progress Notes (Signed)
UR COMPLETED  

## 2015-03-06 NOTE — Progress Notes (Signed)
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 airway/laryngeal closure Pharyngeal Material enters airway, passes BELOW cords and not ejected out despite cough attempt by patient Pharyngeal- Nectar Straw -- Pharyngeal -- Pharyngeal- Thin Teaspoon -- Pharyngeal -- Pharyngeal- Thin Cup -- Pharyngeal -- Pharyngeal- Thin Straw -- Pharyngeal -- Pharyngeal- Puree WFL Pharyngeal -- Pharyngeal- Mechanical Soft -- Pharyngeal -- Pharyngeal- Regular -- Pharyngeal -- Pharyngeal- Multi-consistency -- Pharyngeal -- Pharyngeal- Pill -- Pharyngeal -- Pharyngeal Comment --  CHL IP CERVICAL ESOPHAGEAL PHASE 03/03/2015 Cervical Esophageal Phase WFL Pudding Teaspoon -- Pudding Cup -- Honey Teaspoon -- Honey Cup -- Nectar Teaspoon -- Nectar Cup -- Nectar Straw -- Thin Teaspoon -- Thin Cup -- Thin Straw -- Puree -- Mechanical Soft -- Regular -- Multi-consistency -- Pill -- Cervical Esophageal Comment -- No flowsheet data found. Royce Macadamia 03/03/2015, 2:43 PM  Breck Coons Lonell Face.Ed [employer] [phone]  Jeoffrey Massed, MD  [facility] Pager:[phone]  If 7PM-7AM, please contact night-coverage [email or web address] Password [email or web address] 03/06/2015, 12:23 PM   LOS: 10 days

## 2015-03-06 NOTE — Progress Notes (Signed)
ANTIBIOTIC CONSULT NOTE   Pharmacy Consult for Vancomycin  Indication: CoNS Bacteremia  Labs:  Recent Labs  03/06/15 1952  CREATININE 0.80   Estimated Creatinine Clearance: 100.9 mL/min (by C-G formula based on Cr of 0.8).  Recent Labs  03/05/15 0248 03/06/15 1952  VANCOTROUGH 7* 17    Assessment: 49 yof on vanc for CONS bacteremia. VT therapeutic (17) on 1g IV q8h. All doses received. Drawn appropriately. Continue at current dose and check VT ~weekly or if renal function changes.   Goal of Therapy:  Vancomycin trough level 15-20 mcg/ml  Plan:  Continue vanc 1g IV q8h Monitor clinical progress, c/s, renal function, abx plan/LOT VT weekly  Babs Bertin, PharmD, [facility] Clinical Pharmacist Pager [phone] 03/06/2015 9:50 PM

## 2015-03-07 ENCOUNTER — Encounter (HOSPITAL_COMMUNITY): Payer: Self-pay | Admitting: Pulmonary Disease

## 2015-03-07 LAB — CULTURE, BLOOD (ROUTINE X 2)

## 2015-03-07 LAB — BASIC METABOLIC PANEL
ANION GAP: 8 (ref 5–15)
BUN: 19 mg/dL (ref 6–20)
CALCIUM: 8.8 mg/dL — AB (ref 8.9–10.3)
CHLORIDE: 106 mmol/L (ref 101–111)
CO2: 25 mmol/L (ref 22–32)
Creatinine, Ser: 0.66 mg/dL (ref 0.44–1.00)
GFR calc non Af Amer: >60 mL/min (ref >60)
Glucose, Bld: 101 mg/dL — ABNORMAL HIGH (ref 65–99)
Potassium: 3.9 mmol/L (ref 3.5–5.1)
Sodium: 139 mmol/L (ref 135–145)

## 2015-03-07 LAB — CBC
HEMATOCRIT: 37.1 % (ref 36.0–46.0)
HEMOGLOBIN: 12.4 g/dL (ref 12.0–15.0)
MCH: 31.6 pg (ref 26.0–34.0)
MCHC: 33.4 g/dL (ref 30.0–36.0)
MCV: 94.6 fL (ref 78.0–100.0)
Platelets: 309 10*3/uL (ref 150–400)
RBC: 3.92 MIL/uL (ref 3.87–5.11)
RDW: 13.5 % (ref 11.5–15.5)
WBC: 10.1 10*3/uL (ref 4.0–10.5)

## 2015-03-07 MED ORDER — FOSFOMYCIN TROMETHAMINE 3 G PO PACK
3.0000 g | PACK | Freq: Once | ORAL | Status: AC
  Administered 2015-03-07: 3 g via ORAL
  Filled 2015-03-07: qty 3

## 2015-03-07 NOTE — Progress Notes (Signed)
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 Penetration/Aspiration during swallow;Reduced laryngeal elevation;Reduced airway/laryngeal closure Pharyngeal Material enters airway, passes BELOW cords and not ejected out despite cough attempt by patient Pharyngeal- Nectar Straw -- Pharyngeal -- Pharyngeal- Thin Teaspoon -- Pharyngeal -- Pharyngeal- Thin Cup -- Pharyngeal -- Pharyngeal- Thin Straw -- Pharyngeal -- Pharyngeal- Puree WFL Pharyngeal -- Pharyngeal- Mechanical Soft -- Pharyngeal -- Pharyngeal- Regular -- Pharyngeal -- Pharyngeal- Multi-consistency -- Pharyngeal -- Pharyngeal- Pill -- Pharyngeal -- Pharyngeal Comment --  CHL IP CERVICAL ESOPHAGEAL PHASE 03/03/2015 Cervical Esophageal Phase WFL Pudding Teaspoon -- Pudding Cup -- Honey Teaspoon -- Honey Cup -- Nectar Teaspoon -- Nectar Cup -- Nectar Straw -- Thin Teaspoon -- Thin Cup -- Thin Straw -- Puree -- Mechanical Soft --  Regular -- Multi-consistency -- Pill -- Cervical Esophageal Comment -- No flowsheet data found. Royce Macadamia 03/03/2015, 2:43 PM   Breck Coons Lonell Face.Ed CCC-SLP Pager [phone]               Leroy Sea, MD  [facility] Pager:[phone]  If 7PM-7AM, please contact night-coverage [email or web address] Password [facility] 03/07/2015, 12:36 PM   LOS: 11 days

## 2015-03-08 DIAGNOSIS — T43012A Poisoning by tricyclic antidepressants, intentional self-harm, initial encounter: Secondary | ICD-10-CM

## 2015-03-08 NOTE — Consult Note (Signed)
[facility] Face-to[facility]Face Psychiatry Consult follow-up  Reason for Consult:  Bipolar disorder and status post intentional overdose Referring Physician:  Dr. Candiss Norse Patient Identification: Brittany [name] MRN:  [id number] Principal Diagnosis: Drug overdose Diagnosis:   Patient Active Problem List   Diagnosis Date Noted  . Postextubation stridor [T85.89XA, R06.1]   . History of hepatitis C virus infection [Z86.19] 02/26/2015  . Acute respiratory failure ([place]) [J96.00]   . Altered mental status [R41.82]   . Endotracheally intubated [Z78.9]   . Acute encephalopathy [G93.40] 02/24/2015  . Drug overdose [T50.901A] 02/24/2015  . 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 Right facial pain [R51] 01/05/2015  . Orbit fracture ([place]) [S02.80XA] 11/30/2013  . Polypharmacy [Z79.899] 01/18/2012    Class: Chronic  . Bipolar affective ([place]) [F31.9] 01/18/2012  . Borderline personality disorder [F60.3] 01/18/2012  . Anxiety disorder [F41.9] 01/14/2012  . Pyelonephritis [N12] 09/16/2011  . Hypothyroidism [E03.9] 09/16/2011  . Anxiety [F41.9] 09/16/2011    Total Time spent with patient: 20 minutes  Subjective:   Brittany [name] is a 50 y.o. female patient admitted with intentional drug overdose.  HPI: Brittany [name] is a 50 years old female admitted to [facility] with intentional drug overdose and has bipolar disorder. Patient seen, chart reviewed and case discussed with the treatment team for face-to-face psychiatric consultation and evaluation of increased symptoms of depression and is intentional drug overdose reportedly taken 10 pills of baclofen and possibly 50 tabs of nortriptyline. Patient required intubation twice since was admitted to the [facility]. Patient was intubated second time because of stridor. Patient stated that she has taken too many please because she's not feeling good and hoping to feel better but she end up becoming more sick. Patient denies active suicidal/homicidal ideation, intention or  plans. Patient has no evidence of auditory/visual hallucinations, paranoia and delusions. This evaluation is limited because patient talking with the low twice and sometimes hard to hear her effectively. Past Psychiatric History: Patient is known to the behavioral health Hospital in emergency department from her multiple visits over the years. Patient reportedly not seeing any psychiatrist but receiving her medications from the primary care physician. Patient used to see a psychiatrist at [facility] in the past .  Interval history: Patient appeared talking near her bed and also has a Air cabin crew in the room. Patient appeared with the good mood, appropriate and reactive affect. Patient has normal speech and thought process. Patient denies current symptoms of depression, mania, suicidal or/homicidal ideation, intention or plans. Patient understand that she needed IV antibiotics as recommended by physician and willing to participate in her therapy and also continue to seek for skilled nursing facility for ongoing treatment required. Patient does not required to be placed in acute psychiatric hospitalization as she has been stable with the current medication regimen and willing to participate in outpatient psychiatric services and medication management.    Risk to Self: Is patient at risk for suicide?: No (denies any suicidal thoughts) Risk to Others:   Prior Inpatient Therapy:   Prior Outpatient Therapy:    Past Medical History:  Past Medical History  Diagnosis Date  . Hypertension   . Bipolar 1 disorder ([place])   . Anxiety   . Back pain   . Bulging disc   . Ruptured intervertebral disc   . Migraine   . Head injury   . Short-term memory loss   . Depression   . Hypothyroidism   . Goiter   . Facial pain   . Facial nerve  injury     Past Surgical History  Procedure Laterality Date  . Abdominal hysterectomy     Family History:  Family History  Problem Relation Age of Onset  . Migraines  Mother   . Migraines Father   . Other Father   . Migraines Sister    Family Psychiatric  History: Patient states that she lives with her fianc who is a Pharmacist, hospital. Patient has a daughter who does not live with her.  Social History:  History  Alcohol Use No     History  Drug Use  . Yes  . Special: Marijuana    Social History   Social History  . Marital Status: Legally Separated    Spouse Name: N/A  . Number of Children: 1  . Years of Education: 16   Occupational History  . Unemployed    Social History Main Topics  . Smoking status: Never Smoker   . Smokeless tobacco: Never Used  . Alcohol Use: No  . Drug Use: Yes    Special: Marijuana  . Sexual Activity: Yes    Birth Control/ Protection: Condom   Other Topics Concern  . None   Social History Narrative   Lives at home with her fiancee.   Right-handed.   No caffeine use.   Additional Social History:                          Allergies:   Allergies  Allergen Reactions  . Toradol [Ketorolac Tromethamine] Swelling  . Suboxone [Buprenorphine Hcl-Naloxone Hcl]     cellulitis   . Tegretol [Carbamazepine] Rash  . Trazodone And Nefazodone Other (See Comments)    Bladder infection    Labs:  Results for orders placed or performed during the hospital encounter of 02/24/15 (from the past 48 hour(s))  Vancomycin, trough     Status: None   Collection Time: 03/06/15  7:52 PM  Result Value Ref Range   Vancomycin Tr 17 10.0 - 20.0 ug/mL  Creatinine, serum     Status: None   Collection Time: 03/06/15  7:52 PM  Result Value Ref Range   Creatinine, Ser 0.80 0.44 - 1.00 mg/dL   GFR calc non Af Amer >60 >60 mL/min   GFR calc Af Amer >60 >60 mL/min    Comment: (NOTE) The eGFR has been calculated using the CKD EPI equation. This calculation has not been validated in all clinical situations. eGFR's persistently <60 mL/min signify possible Chronic Kidney Disease.   CBC     Status: None   Collection Time: 03/07/15   6:50 AM  Result Value Ref Range   WBC 10.1 4.0 - 10.5 K/uL   RBC 3.92 3.87 - 5.11 MIL/uL   Hemoglobin 12.4 12.0 - 15.0 g/dL   HCT 37.1 36.0 - 46.0 %   MCV 94.6 78.0 - 100.0 fL   MCH 31.6 26.0 - 34.0 pg   MCHC 33.4 30.0 - 36.0 g/dL   RDW 13.5 11.5 - 15.5 %   Platelets 309 150 - 400 K/uL  Basic metabolic panel     Status: Abnormal   Collection Time: 03/07/15  6:50 AM  Result Value Ref Range   Sodium 139 135 - 145 mmol/L   Potassium 3.9 3.5 - 5.1 mmol/L   Chloride 106 101 - 111 mmol/L   CO2 25 22 - 32 mmol/L   Glucose, Bld 101 (H) 65 - 99 mg/dL   BUN 19 6 - 20 mg/dL  Creatinine, Ser 0.66 0.44 - 1.00 mg/dL   Calcium 8.8 (L) 8.9 - 10.3 mg/dL   GFR calc non Af Amer >60 >60 mL/min   GFR calc Af Amer >60 >60 mL/min    Comment: (NOTE) The eGFR has been calculated using the CKD EPI equation. This calculation has not been validated in all clinical situations. eGFR's persistently <60 mL/min signify possible Chronic Kidney Disease.    Anion gap 8 5 - 15    Current Facility-Administered Medications  Medication Dose Route Frequency Provider Last Rate Last Dose  . 0.9 %  sodium chloride infusion  250 mL Intravenous PRN Juluis Mire, MD 10 mL/hr at 03/01/15 2000 250 mL at 03/01/15 2000  . acetaminophen (TYLENOL) solution 650 mg  650 mg Oral Q6H PRN Chesley Mires, MD   650 mg at 03/08/15 1152  . antiseptic oral rinse solution (CORINZ)  7 mL Mouth Rinse QID Rush Farmer, MD   7 mL at 03/08/15 1153  . bisacodyl (DULCOLAX) suppository 10 mg  10 mg Rectal Daily PRN Chesley Mires, MD      . chlorhexidine gluconate (PERIDEX) 0.12 % solution 15 mL  15 mL Mouth Rinse BID Chesley Mires, MD   15 mL at 03/08/15 0839  . DULoxetine (CYMBALTA) DR capsule 30 mg  30 mg Oral Daily Rush Farmer, MD   30 mg at 03/08/15 4010  . enoxaparin (LOVENOX) injection 40 mg  40 mg Subcutaneous Daily Marjan Rabbani, MD   40 mg at 03/08/15 0833  . gabapentin (NEURONTIN) capsule 400 mg  400 mg Oral TID Jonetta Osgood,  MD   400 mg at 03/08/15 2725  . levothyroxine (SYNTHROID, LEVOTHROID) tablet 50 mcg  50 mcg Oral QAC breakfast Corky Sox, MD   50 mcg at 03/08/15 [phone]  . lisinopril (PRINIVIL,ZESTRIL) tablet 10 mg  10 mg Oral Daily Corky Sox, MD   10 mg at 03/08/15 0830  . multivitamin with minerals tablet 1 tablet  1 tablet Per Tube Daily Asencion Islam, RD   1 tablet at 03/08/15 [phone]  . ondansetron (ZOFRAN) injection 4 mg  4 mg Intravenous Q6H PRN Wilhelmina Mcardle, MD   4 mg at 03/04/15 1211  . pantoprazole (PROTONIX) EC tablet 40 mg  40 mg Oral BID Melida Quitter, MD   40 mg at 03/08/15 7425  . phenol (CHLORASEPTIC) mouth spray 1 spray  1 spray Mouth/Throat PRN Raylene Miyamoto, MD      . RESOURCE THICKENUP CLEAR   Oral PRN Rush Farmer, MD      . sennosides (SENOKOT) 8.8 MG/5ML syrup 5 mL  5 mL Per Tube BID PRN Chesley Mires, MD      . vancomycin (VANCOCIN) IVPB 1000 mg/200 mL premix  1,000 mg Intravenous Q8H Darl Pikes, RPH   1,000 mg at 03/08/15 1152    Musculoskeletal: Strength & Muscle Tone: decreased Gait & Station: unable to stand Patient leans: N/A  Psychiatric Specialty Exam: ROS   Blood pressure 124/70, pulse 55, temperature 97.8 F (36.6 C), temperature source Oral, resp. rate 18, height _0  (1.626 m), weight 101.8 kg (224 lb 6.9 oz), SpO2 98 %.Body mass index is 38.5 kg/(m^2).  General Appearance: Casual  Eye Contact::  Good  Speech:  Clear and Coherent  Volume:  Normal  Mood:  Euthymic  Affect:  Appropriate and Congruent  Thought Process:  Coherent and Goal Directed  Orientation:  Full (Time, Place, and Person)  Thought Content:  WDL  Suicidal Thoughts:  No  Homicidal Thoughts:  No  Memory:  Immediate;   Fair Recent;   Fair  Judgement:  Fair  Insight:  Fair  Psychomotor Activity:  Normal  Concentration:  Fair  Recall:  Good  Fund of Knowledge:Good  Language: Good  Akathisia:  Yes  Handed:  Right  AIMS (if indicated):     Assets:  Communication Skills Desire  for Improvement Financial Resources/Insurance Housing Intimacy Leisure Time Resilience Social Support Transportation  ADL's:  Impaired  Cognition: WNL  Sleep:      Treatment Plan Summary: Daily contact with patient to assess and evaluate symptoms and progress in treatment and Medication management Continue Cymbalta 30 mg daily for depression and Neurontin 400 mg 3 times daily for mood swings  Discontinue safety sitter as patient is able to contract for safety and repeatedly denied suicidal behaviors, intentions and plans.  Appreciate psychiatric consultation and follow up as clinically required Please contact [phone] or [phone] if needs further assistance   Disposition: Patient benefit from the skilled nursing facility for 2 weeks antibiotic therapy as recommended and does not required inpatient psychiatric hospitalization as she has improved mental status, mood and contract for safety. Patient does not meet criteria for psychiatric inpatient admission. Supportive therapy provided about ongoing stressors.  Garland Smouse,JANARDHAHA R. 03/08/2015 2:11 PM
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
## 2015-03-08 NOTE — Progress Notes (Signed)
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 attempt by patient Pharyngeal- Nectar Straw -- Pharyngeal -- Pharyngeal- Thin Teaspoon -- Pharyngeal -- Pharyngeal- Thin Cup -- Pharyngeal -- Pharyngeal- Thin Straw -- Pharyngeal -- Pharyngeal- Puree WFL Pharyngeal -- Pharyngeal- Mechanical Soft -- Pharyngeal -- Pharyngeal- Regular -- Pharyngeal -- Pharyngeal- Multi-consistency -- Pharyngeal -- Pharyngeal- Pill -- Pharyngeal -- Pharyngeal Comment --  CHL IP CERVICAL ESOPHAGEAL PHASE 03/03/2015 Cervical Esophageal Phase WFL Pudding Teaspoon -- Pudding Cup -- Honey Teaspoon -- Honey Cup -- Nectar Teaspoon -- Nectar Cup -- Nectar Straw -- Thin Teaspoon -- Thin Cup -- Thin Straw -- Puree -- Mechanical Soft -- Regular -- Multi-consistency -- Pill -- Cervical Esophageal Comment -- No flowsheet data found. Royce Macadamia 03/03/2015, 2:43 PM  Breck Coons Lonell Face.Ed CCC-SLP Pager [phone]               Leroy Sea, MD  [facility] Pager:[phone]  If 7PM-7AM, please contact night-coverage [email or web address] Password [email or web address] 03/08/2015, 10:04 AM    LOS: 12 days

## 2015-03-08 NOTE — Progress Notes (Signed)
Nutrition Follow-up  DOCUMENTATION CODES:   Obesity unspecified  INTERVENTION:   -Continue Magic Cup TID  NUTRITION DIAGNOSIS:   Inadequate oral intake related to dysphagia as evidenced by meal completion < 50%.  Progressing  GOAL:   Patient will meet greater than or equal to 90% of their needs  Progressing  MONITOR:   PO intake, Supplement acceptance, Diet advancement, Labs, I & O's  REASON FOR ASSESSMENT:   Consult Enteral/tube feeding initiation and management  ASSESSMENT:   Pt with past medical history of hypertension, hypothyroidism, depression, bipolar disorder, anxiety, and history of IV drug use presenting OD.  Pt seen ambulating halls and speaking with family members at time of visit.   Pt transferred from ICU to medical floor on 03/03/15.   Pt underwent transnasal fiberoptic laryngoscopy, which revealed vocal cord edema. SLP following; BSE on 03/04/15 recommended dysphagia 2 diet with honey thick liquids. Intake has improved; noted 50-100% of meal records.   CSW following. Plan to discharge to inpatient psychiatric facility once medically stable.   Diet Order:  DIET DYS 3 Room service appropriate?: Yes; Fluid consistency:: Nectar Thick  Skin:  Reviewed, no issues  Last BM:  03/05/15  Height:   Ht Readings from Last 1 Encounters:  03/03/15  (1.626 m)    Weight:   Wt Readings from Last 1 Encounters:  03/03/15 224 lb 6.9 oz (101.8 kg)    Ideal Body Weight:  54.5 kg  BMI:  Body mass index is 38.5 kg/(m^2).  Estimated Nutritional Needs:   Kcal:  1700-1900  Protein:  100-115 grams  Fluid:  > 1.7 L/day  EDUCATION NEEDS:   No education needs identified at this time  Izzabelle Bouley A. Mayford Knife, RD, LDN, CDE Pager: [phone] After hours Pager: [phone]

## 2015-03-08 NOTE — NC FL2 (Signed)
[place] MEDICAID FL2 LEVEL OF CARE SCREENING TOOL     IDENTIFICATION  Patient Name: Brittany [name] Birthdate: 30-Mar-1965 Sex: female Admission Date (Current Location): 02/24/2015  [facility] and [place] Number:  Producer, television/film/video and Address:  [facility], [address] [place], [place] [address]      Provider Number: [id number]  Attending Physician Name and Address:  Leroy Sea, MD  Relative Name and Phone Number:  Jonny Ruiz significant other, [phone]    Current Level of Care: Hospital Recommended Level of Care: Skilled Nursing Facility Prior Approval Number:    Date Approved/Denied:   PASRR Number:    Discharge Plan: SNF    Current Diagnoses: Patient Active Problem List   Diagnosis Date Noted  . Postextubation stridor   . History of hepatitis C virus infection 02/26/2015  . Acute respiratory failure (HCC)   . Altered mental status   . Endotracheally intubated   . Acute encephalopathy 02/24/2015  . Drug overdose 02/24/2015  . Facial nerve injury   . Right facial pain 01/05/2015  . Orbit fracture (HCC) 11/30/2013  . Polypharmacy 01/18/2012    Class: Chronic  . Bipolar affective (HCC) 01/18/2012  . Borderline personality disorder 01/18/2012  . Anxiety disorder 01/14/2012  . Pyelonephritis 09/16/2011  . Hypothyroidism 09/16/2011  . Anxiety 09/16/2011    Orientation RESPIRATION BLADDER Height & Weight    Self, Time, Situation, Place  Normal Continent   224 lbs.  BEHAVIORAL SYMPTOMS/MOOD NEUROLOGICAL BOWEL NUTRITION STATUS   (N/A)   Continent  (Please see DC summary)  AMBULATORY STATUS COMMUNICATION OF NEEDS Skin   Independent Verbally Normal                       Personal Care Assistance Level of Assistance  Bathing, Feeding, Dressing Bathing Assistance: Independent Feeding assistance: Independent Dressing Assistance: Independent     Functional Limitations Info             SPECIAL CARE FACTORS FREQUENCY   PT (By licensed PT), OT (By licensed OT)     PT Frequency: min 3x/week OT Frequency: min 2x/week            Contractures      Additional Factors Info  Allergies, Code Status, Psychotropic Code Status Info: Full Allergies Info: Toradol, Suboxone, Tegretol, Trazodone And Nefazodone Psychotropic Info: DULoxetine (CYMBALTA) DR capsule 30 mg;          Current Medications (03/08/2015):  This is the current hospital active medication list Current Facility-Administered Medications  Medication Dose Route Frequency Provider Last Rate Last Dose  . 0.9 %  sodium chloride infusion  250 mL Intravenous PRN Otis Brace, MD 10 mL/hr at 03/01/15 2000 250 mL at 03/01/15 2000  . acetaminophen (TYLENOL) solution 650 mg  650 mg Oral Q6H PRN Coralyn Helling, MD   650 mg at 03/08/15 1152  . antiseptic oral rinse solution (CORINZ)  7 mL Mouth Rinse QID Alyson Reedy, MD   7 mL at 03/08/15 1547  . bisacodyl (DULCOLAX) suppository 10 mg  10 mg Rectal Daily PRN Coralyn Helling, MD      . chlorhexidine gluconate (PERIDEX) 0.12 % solution 15 mL  15 mL Mouth Rinse BID Coralyn Helling, MD   15 mL at 03/08/15 0839  . DULoxetine (CYMBALTA) DR capsule 30 mg  30 mg Oral Daily Alyson Reedy, MD   30 mg at 03/08/15 3086  . enoxaparin (LOVENOX) injection 40 mg  40 mg  Subcutaneous Daily Otis Brace, MD   40 mg at 03/08/15 0833  . gabapentin (NEURONTIN) capsule 400 mg  400 mg Oral TID Maretta Bees, MD   400 mg at 03/08/15 1546  . levothyroxine (SYNTHROID, LEVOTHROID) tablet 50 mcg  50 mcg Oral QAC breakfast Courtney Paris, MD   50 mcg at 03/08/15 [phone]  . lisinopril (PRINIVIL,ZESTRIL) tablet 10 mg  10 mg Oral Daily Courtney Paris, MD   10 mg at 03/08/15 0830  . multivitamin with minerals tablet 1 tablet  1 tablet Per Tube Daily Arlyss Gandy, RD   1 tablet at 03/08/15 [phone]  . ondansetron (ZOFRAN) injection 4 mg  4 mg Intravenous Q6H PRN Merwyn Katos, MD   4 mg at 03/04/15 1211  . pantoprazole (PROTONIX) EC tablet 40 mg   40 mg Oral BID Christia Reading, MD   40 mg at 03/08/15 2956  . phenol (CHLORASEPTIC) mouth spray 1 spray  1 spray Mouth/Throat PRN Nelda Bucks, MD      . RESOURCE THICKENUP CLEAR   Oral PRN Alyson Reedy, MD      . sennosides (SENOKOT) 8.8 MG/5ML syrup 5 mL  5 mL Per Tube BID PRN Coralyn Helling, MD      . vancomycin (VANCOCIN) IVPB 1000 mg/200 mL premix  1,000 mg Intravenous Q8H Remi Haggard, RPH   1,000 mg at 03/08/15 1152     Discharge Medications: Please see discharge summary for a list of discharge medications.  Relevant Imaging Results:  Relevant Lab Results:   Additional Information SSN: [id number] [place], [place]

## 2015-03-08 NOTE — Progress Notes (Signed)
Speech Language Pathology Treatment: Dysphagia  Patient Details Name: Brittany [name] MRN: [id number] DOB: 1965/10/24 Today's Date: 03/08/2015 Time: 4098-1191 SLP Time Calculation (min) (ACUTE ONLY): 22 min  Assessment / Plan / Recommendation Clinical Impression  ST follow up for therapeutic diet tolerance and possible diet upgrade.  Chart review revealed that the patient has been afebrile since upgrade of diet last week, intake has been good and lungs are clear but diminished.  The patient reported minimal coughing with intake.  1:1 sitter in the room reported that she did well with breakfast and nursing  Reported no obvious issues.  Meal observation was completed with upgraded solid and liquids textures (i.e. Dysphagia 3 and nectar thick liquids).  The patient's swallow trigger was timely and hyo-laryngeal excursion was judged to be adequate.  Overt s/s of aspiration were not noted.   Recommend advance diet to dysphagia 3 with nectar thick liquids.  Anticipate return to a regular diet soon.  ST will continue to follow.     HPI HPI: Patient is a 50 year old female with a past medical history of hypertension, hypothyroidism, depression, bipolar disorder, anxiety, and history of IV drug use presenting with overdose (10 pills from her baclofen bottle and possibly 50 pills from her nortriptyline bottle). Intubated 1/11-1/17. CXR improving basilar atelectasis.      SLP Plan  Continue with current plan of care     Recommendations  Diet recommendations: Dysphagia 3 (mechanical soft);Nectar-thick liquid Liquids provided via: Cup Medication Administration: Crushed with puree Supervision: Patient able to self feed Compensations: Slow rate;Small sips/bites Postural Changes and/or Swallow Maneuvers: Seated upright 90 degrees             Oral Care Recommendations: Oral care BID Plan: Continue with current plan of care     GO              Dimas Aguas, MA, CCC-SLP Acute Rehab  SLP [phone] Fleet Contras 03/08/2015, 9:53 AM

## 2015-03-09 LAB — URINE CULTURE

## 2015-03-09 LAB — CULTURE, BLOOD (ROUTINE X 2): CULTURE: NO GROWTH

## 2015-03-09 MED ORDER — SODIUM CHLORIDE 0.9% FLUSH
10.0000 mL | INTRAVENOUS | Status: DC | PRN
  Administered 2015-03-10: 10 mL
  Filled 2015-03-09: qty 40

## 2015-03-09 NOTE — Progress Notes (Signed)
Pharmacy Antibiotic Follow-up Note  Brittany [name] is a 50 y.o. year-old female admitted on 02/24/2015.  The patient is currently on day 7 of vancomycin for bacteremia.  Assessment/Plan: This patient's current antibiotics will be continued without adjustments.  - vancomycin 1g IV q8h. Plan is to continue for 7 days after last set of negative cultures (drawn 1/22. abx would go through 1/29)  Temp (24hrs), Avg:97.8 F (36.6 C), Min:97.4 F (36.3 C), Max:98.1 F (36.7 C)   Recent Labs Lab 03/03/15 0415 03/07/15 0650  WBC 12.3* 10.1    Recent Labs Lab 03/03/15 0415 03/06/15 1952 03/07/15 0650  CREATININE 0.55 0.80 0.66   Estimated Creatinine Clearance: 100.9 mL/min (by C-G formula based on Cr of 0.66).    Allergies  Allergen Reactions  . Toradol [Ketorolac Tromethamine] Swelling  . Suboxone [Buprenorphine Hcl-Naloxone Hcl]     cellulitis   . Tegretol [Carbamazepine] Rash  . Trazodone And Nefazodone Other (See Comments)    Bladder infection    Antimicrobials this admission: Vanc 1/18>>  Levels/dose changes this admission: * 1/20 VT 7 mcg/ml >> inaccurate d/t missed doses * 1/21: VT 17 mcg/ml >> continue 1g/8h   Microbiology results: 1/11 UCx >> insignificant 1/12 MRSA PCR >> neg 1/13 BCx >> ngtd 1/17 BCx >> 2/2 CoNS 1/17 UCx >> 100k Enteroccocus 1/19 BCx >> 1/2 CoNS 1/22 BCx >> ngtd  Thank you for allowing pharmacy to be a part of this patient's care.  Davon Abdelaziz D. Rizwan Kuyper, PharmD, BCPS Clinical Pharmacist Pager: [phone] 03/09/2015 12:36 PM

## 2015-03-09 NOTE — Progress Notes (Signed)
Physical Therapy Treatment Patient Details Name: Brittany [name] MRN: [id number] DOB: 03/19/1965 Today's Date: Apr 06, 2015    History of Present Illness Patient is a 50 year old female with a past medical history of hypertension, hypothyroidism, depression, bipolar disorder, anxiety, and history of IV drug use presenting with overdose (10 pills from her baclofen bottle and possibly 50 pills from her nortriptyline bottle). Intubated 1/11-1/17. CXR improving basilar atelectasis.     PT Comments    Pt doing well with mobility and no further PT needed.     Follow Up Recommendations  No PT follow up     Equipment Recommendations  None recommended by PT    Recommendations for Other Services       Precautions / Restrictions Precautions Precautions: Fall Restrictions Weight Bearing Restrictions: No    Mobility  Bed Mobility                  Transfers Overall transfer level: Independent Equipment used: None Transfers: Sit to/from Stand Sit to Stand: Independent            Ambulation/Gait Ambulation/Gait assistance: Independent Ambulation Distance (Feet): 500 Feet Assistive device: Rolling walker (2 wheeled);None Gait Pattern/deviations: WFL(Within Functional Limits)   Gait velocity interpretation: at or above normal speed for age/gender General Gait Details: Steady gait.    Stairs Stairs: Yes Stairs assistance: Modified independent (Device/Increase time) Stair Management: One rail Right;Alternating pattern;Forwards Number of Stairs: 5    Wheelchair Mobility    Modified Rankin (Stroke Patients Only)       Balance Overall balance assessment: Needs assistance Sitting-balance support: No upper extremity supported;Feet supported Sitting balance-Leahy Scale: Normal     Standing balance support: No upper extremity supported;During functional activity Standing balance-Leahy Scale: Normal                      Cognition Arousal/Alertness:  Awake/alert Behavior During Therapy: WFL for tasks assessed/performed Overall Cognitive Status: History of cognitive impairments - at baseline                      Exercises      General Comments        Pertinent Vitals/Pain Pain Assessment: Faces Faces Pain Scale: Hurts a little bit Pain Location: BUE Pain Descriptors / Indicators: Sore Pain Intervention(s): Limited activity within patient's tolerance    Home Living                      Prior Function            PT Goals (current goals can now be found in the care plan section) Progress towards PT goals: Goals met/education completed, patient discharged from PT    Frequency       PT Plan Discharge plan needs to be updated    Co-evaluation             End of Session   Activity Tolerance: Patient tolerated treatment well Patient left: in chair;with call bell/phone within reach;with chair alarm set     Time: 2010-0712 PT Time Calculation (min) (ACUTE ONLY): 8 min  Charges:  $Gait Training: 8-22 mins                    G Codes:      Eland Lamantia 04-06-15, 9:43 AM [facility] PT [phone]

## 2015-03-09 NOTE — NC FL2 (Signed)
[place] MEDICAID FL2 LEVEL OF CARE SCREENING TOOL     IDENTIFICATION  Patient Name: Brittany [name] Birthdate: 1965/09/20 Sex: female Admission Date (Current Location): 02/24/2015  [facility] and [place] Number:  Producer, television/film/video and Address:  The . [facility], [address], [place], [place] [address]      Provider Number: [id number]  Attending Physician Name and Address:  Leroy Sea, MD  Relative Name and Phone Number:  Jonny Ruiz significant other, [phone]    Current Level of Care: Hospital Recommended Level of Care: Skilled Nursing Facility Prior Approval Number:    Date Approved/Denied:   PASRR Number:   [id number]  Discharge Plan: SNF    Current Diagnoses: Patient Active Problem List   Diagnosis Date Noted  . Postextubation stridor   . History of hepatitis C virus infection 02/26/2015  . Acute respiratory failure (HCC)   . Altered mental status   . Endotracheally intubated   . Acute encephalopathy 02/24/2015  . Drug overdose 02/24/2015  . Facial nerve injury   . Right facial pain 01/05/2015  . Orbit fracture (HCC) 11/30/2013  . Polypharmacy 01/18/2012    Class: Chronic  . Bipolar affective (HCC) 01/18/2012  . Borderline personality disorder 01/18/2012  . Anxiety disorder 01/14/2012  . Pyelonephritis 09/16/2011  . Hypothyroidism 09/16/2011  . Anxiety 09/16/2011    Orientation RESPIRATION BLADDER Height & Weight    Self, Time, Situation, Place  Normal Continent   224 lbs.  BEHAVIORAL SYMPTOMS/MOOD NEUROLOGICAL BOWEL NUTRITION STATUS   (N/A)   Continent  (Please see DC summary)  AMBULATORY STATUS COMMUNICATION OF NEEDS Skin   Independent Verbally Normal                       Personal Care Assistance Level of Assistance  Bathing, Feeding, Dressing Bathing Assistance: Independent Feeding assistance: Independent Dressing Assistance: Independent     Functional Limitations Info             SPECIAL CARE  FACTORS FREQUENCY  PT (By licensed PT), OT (By licensed OT)     PT Frequency: min 3x/week OT Frequency: min 2x/week            Contractures      Additional Factors Info  Allergies, Code Status, Psychotropic Code Status Info: Full Allergies Info: Toradol, Suboxone, Tegretol, Trazodone And Nefazodone Psychotropic Info: DULoxetine (CYMBALTA) DR capsule 30 mg;          Current Medications (03/09/2015):  This is the current hospital active medication list Current Facility-Administered Medications  Medication Dose Route Frequency Provider Last Rate Last Dose  . 0.9 %  sodium chloride infusion  250 mL Intravenous PRN Otis Brace, MD 10 mL/hr at 03/01/15 2000 250 mL at 03/01/15 2000  . acetaminophen (TYLENOL) solution 650 mg  650 mg Oral Q6H PRN Coralyn Helling, MD   650 mg at 03/09/15 1117  . antiseptic oral rinse solution (CORINZ)  7 mL Mouth Rinse QID Alyson Reedy, MD   7 mL at 03/09/15 1618  . bisacodyl (DULCOLAX) suppository 10 mg  10 mg Rectal Daily PRN Coralyn Helling, MD      . chlorhexidine gluconate (PERIDEX) 0.12 % solution 15 mL  15 mL Mouth Rinse BID Coralyn Helling, MD   15 mL at 03/09/15 0835  . DULoxetine (CYMBALTA) DR capsule 30 mg  30 mg Oral Daily Alyson Reedy, MD   30 mg at 03/09/15 0834  . enoxaparin (LOVENOX) injection 40 mg  40  mg Subcutaneous Daily Marjan Rabbani, MD   40 mg at 03/09/15 0836  . gabapentin (NEURONTIN) capsule 400 mg  400 mg Oral TID Maretta Bees, MD   400 mg at 03/09/15 1617  . levothyroxine (SYNTHROID, LEVOTHROID) tablet 50 mcg  50 mcg Oral QAC breakfast Courtney Paris, MD   50 mcg at 03/09/15 [phone]  . lisinopril (PRINIVIL,ZESTRIL) tablet 10 mg  10 mg Oral Daily Courtney Paris, MD   10 mg at 03/09/15 0834  . multivitamin with minerals tablet 1 tablet  1 tablet Per Tube Daily Arlyss Gandy, RD   1 tablet at 03/09/15 [phone]  . ondansetron (ZOFRAN) injection 4 mg  4 mg Intravenous Q6H PRN Merwyn Katos, MD   4 mg at 03/04/15 1211  . pantoprazole  (PROTONIX) EC tablet 40 mg  40 mg Oral BID Christia Reading, MD   40 mg at 03/09/15 5409  . phenol (CHLORASEPTIC) mouth spray 1 spray  1 spray Mouth/Throat PRN Nelda Bucks, MD      . RESOURCE THICKENUP CLEAR   Oral PRN Alyson Reedy, MD      . sennosides (SENOKOT) 8.8 MG/5ML syrup 5 mL  5 mL Per Tube BID PRN Coralyn Helling, MD      . sodium chloride flush (NS) 0.9 % injection 10-40 mL  10-40 mL Intracatheter PRN Leroy Sea, MD      . vancomycin (VANCOCIN) IVPB 1000 mg/200 mL premix  1,000 mg Intravenous Q8H Remi Haggard, RPH   1,000 mg at 03/09/15 1117     Discharge Medications: Please see discharge summary for a list of discharge medications.  Relevant Imaging Results:  Relevant Lab Results:   Additional Information SSN: [id number] [place], [place]

## 2015-03-09 NOTE — Progress Notes (Signed)
Peripherally Inserted Central Catheter/Midline Placement  The IV Nurse has discussed with the patient and/or persons authorized to consent for the patient, the purpose of this procedure and the potential benefits and risks involved with this procedure.  The benefits include less needle sticks, lab draws from the catheter and patient may be discharged home with the catheter.  Risks include, but not limited to, infection, bleeding, blood clot (thrombus formation), and puncture of an artery; nerve damage and irregular heat beat.  Alternatives to this procedure were also discussed.  PICC/Midline Placement Documentation        Lisabeth Devoid 03/09/2015, 2:43 PM Consent obtained by Stacie Glaze, RN

## 2015-03-09 NOTE — Progress Notes (Signed)
Physical Therapy Discharge Patient Details Name: Brittany [name] MRN: [id number] DOB: 13-Mar-1965 Today's Date: 03/09/2015 Time: 5638-9373 PT Time Calculation (min) (ACUTE ONLY): 8 min  Patient discharged from PT services secondary to goals met and no further PT needs identified.  Please see latest therapy progress note for current level of functioning and progress toward goals.    Progress and discharge plan discussed with patient and/or caregiver: Patient/Caregiver agrees with plan  GP     [facility] 03/09/2015, 9:44 AM

## 2015-03-09 NOTE — Progress Notes (Signed)
[facility] PROGRESS NOTE  Name: Brittany [name]  Age: 50 y.o. Sex: female DOB: 03-Aug-1965  DOA: 02/24/2015 PCP: Evelene [name], MD BJY:[id number]   Brief narrative:  50 year old female with history of depression /bipolar disorder presented to the ED after a drug overdose of baclofen and nortriptyline. She was intubated in the emergency room for airway protection. She was extubated on 1/15, but developed stridor and respiratory distress, was reintubated. After further stabilization with steroids, she was extubated on 1/17. She was subsequently transferred [facility] on on 1/19. Hospital course has not been complicated by development of hoarseness of voice-secondary to vocal cord edema , dysphagia, coagulase-negative bacteremia. Please see below for details  Subjective:  Patient sitting in chair, no fever, headaches, chest or abdominal pain. Complains of mild pain around vocal cords and continued hoarseness, but no stridor and is no respiratory distress.  Assessment/Plan:  Acute hypoxic and hypercarbic respiratory failure: Secondary to Drug overdose. She was intubated on admission, extubated on 1/15-but reintubated due to stridor. He was extubated again on 1/17. Hoarseness of voicerapidly improving-seen by ENT-found to have some vocal cord edema. Off steroids, continue PPI.   Intentional drug overdose: Land, seen by psychiatry- recommendations for inpatient psychiatry placement once medically cleared.  Acute encephalopathy: Likely toxic encephalopathy from above. Resolved  Hoarseness of voice/dysphagia: Secondary to vocal cord edema-likely from recent intubation 2 during this hospitalization. ENT consulted-underwent bedside direct laryngoscopy with confirmed edema/inflammatory changes involving the vocal folds. Steroids have been discontinues, continue PPI. Speech therapy following-diet upgraded to a dysphagia 3 diet.  Coagulase-negative  bacteremia: Chart reviewed in detail-first set of blood cultures on 1/13 negative, when patient was intubated on the vent-patient had fever. Cultures on 1/17 positive. During this time it looks like the patient had a A-line as well. Unfortunately repeat blood culture on 1/19 is positive as well-per RN and Pharmacy patient has refused some doses of IV Vancomycin. Have counseled patient regarding importance of being compliant with IV antibiotics. Unfortunately vancomycinTrough level on1/20 was only 7. Spoke with Dr. Luciana Axe (he reviewed chart over the phone) on 1/19-he advises at least one week of intravenous vancomycin from day of negative blood cultures- will place PICC line today. He does not advise echocardiogram.  Repeat Blood Cultures ordered 03-07-15, monitor. If cultures remain negative stop date for IV vancomycin will be 03/14/2015. PICC will be placed.   Hypertension: Continue lisinopril-follow-up-if needed will add HCTZ.  Hypothyroidism: Continue Synthroid  Depression, Anxiety and Bipolar Disorder: Continue Cymbalta.Currently on Neurontin- at lower dose. Other medications remain on hold   Antimicrobial agents: Anti-infectives    Start     Dose/Rate Route Frequency Ordered Stop   03/03/15 2000  vancomycin (VANCOCIN) IVPB 1000 mg/200 mL premix     1,000 mg 200 mL/hr over 60 Minutes Intravenous Every 8 hours 03/03/15 1544     03/03/15 1700  vancomycin (VANCOCIN) IVPB 1000 mg/200 mL premix  Status:  Discontinued     1,000 mg 200 mL/hr over 60 Minutes Intravenous Every 8 hours 03/03/15 0942 03/03/15 1545   03/03/15 1000  vancomycin (VANCOCIN) 2,000 mg in sodium chloride 0.9 % 500 mL IVPB     2,000 mg 250 mL/hr over 120 Minutes Intravenous  Once 03/03/15 0933 03/03/15 1400      DVT Prophylaxis: Prophylactic Lovenox   Code Status: Full code   Family Communication None at bedside  Procedures: PICC  Disposition Plan: SNF in am  Consultants: Pulmonary/intensive care and  psychiatry  Procedures: None  MEDICATIONS Scheduled Meds: . antiseptic oral rinse  7 mL Mouth Rinse QID  . chlorhexidine gluconate  15 mL Mouth Rinse BID  . DULoxetine  30 mg Oral Daily  . enoxaparin (LOVENOX) injection  40 mg Subcutaneous Daily  . gabapentin  400 mg Oral TID  . levothyroxine  50 mcg Oral QAC breakfast  . lisinopril  10 mg Oral Daily  . multivitamin with minerals  1 tablet Per Tube Daily  . pantoprazole  40 mg Oral BID  . vancomycin  1,000 mg Intravenous Q8H   Continuous Infusions:  PRN Meds: sodium chloride, acetaminophen (TYLENOL) oral liquid 160 mg/5 mL, bisacodyl, ondansetron (ZOFRAN) IV, phenol, RESOURCE THICKENUP CLEAR, sennosides   Objective: Filed Vitals:   03/09/15 0833 03/09/15 0833  BP: 119/63 119/63  Pulse:  72  Temp:    Resp:      Intake/Output Summary (Last 24 hours) at 03/09/15 1038 Last data filed at 03/08/15 1337  Gross per 24 hour  Intake    240 ml  Output      0 ml  Net    240 ml   Filed Weights   03/02/15 0148 03/03/15 0400 03/03/15 1647  Weight: 107 kg (235 lb 14.3 oz) 103.6 kg (228 lb 6.3 oz) 101.8 kg (224 lb 6.9 oz)    Exam:  Gen Exam: Awake and alert. Voice hoarse-but better Neck: Supple, No JVD. No stridor Chest: B/L Clear.  CVS: S1 S2 Regular, no murmurs.  Abdomen: soft, BS +, non tender, non distended.  Extremities: no LE edema, LE warm to touch. Neurologic: Non Focal.  Skin: No Rash.  Wounds: N/A.   Data Reviewed: Basic Metabolic Panel:  Recent Labs Lab 03/03/15 0415 03/06/15 1952 03/07/15 0650  NA 144  --  139  K 3.8  --  3.9  CL 106  --  106  CO2 25  --  25  GLUCOSE 119*  --  101*  BUN 22*  --  19  CREATININE 0.55 0.80 0.66  CALCIUM 9.5  --  8.8*  MG 2.3  --   --   PHOS 3.7  --   --    Liver Function Tests: No results for input(s): AST, ALT, ALKPHOS, BILITOT, PROT, ALBUMIN in the last 168 hours. No results for input(s): LIPASE, AMYLASE in the last 168 hours. No results for input(s):  AMMONIA in the last 168 hours. CBC:  Recent Labs Lab 03/03/15 0415 03/07/15 0650  WBC 12.3* 10.1  NEUTROABS 10.1*  --   HGB 12.4 12.4  HCT 37.7 37.1  MCV 97.7 94.6  PLT 329 309   Cardiac Enzymes: No results for input(s): CKTOTAL, CKMB, CKMBINDEX, TROPONINI in the last 168 hours. BNP (last 3 results) No results for input(s): BNP in the last 8760 hours.  ProBNP (last 3 results) No results for input(s): PROBNP in the last 8760 hours.  CBG:  Recent Labs Lab 03/02/15 2039 03/02/15 2317 03/03/15 0405 03/03/15 0837 03/03/15 1143  GLUCAP 131* 128* 113* 106* 93    Recent Results (from the past 240 hour(s))  Culture, Urine     Status: None (Preliminary result)   Collection Time: 03/02/15 10:50 AM  Result Value Ref Range Status   Specimen Description URINE, RANDOM  Final   Special Requests NONE  Final   Culture   Final    >=100,000 COLONIES/mL ENTEROCOCCUS SPECIES SENDING TO REFERENCE LAB FOR SUSCEPTIBILITY TESTING    Report Status PENDING  Incomplete  Culture, blood (Routine X 2) w Reflex to  ID Panel     Status: None   Collection Time: 03/02/15 12:18 PM  Result Value Ref Range Status   Specimen Description BLOOD LEFT ANTECUBITAL  Final   Special Requests BOTTLES DRAWN AEROBIC AND ANAEROBIC 5CCS  Final   Culture  Setup Time   Final    GRAM POSITIVE COCCI IN CLUSTERS AEROBIC BOTTLE ONLY CRITICAL RESULT CALLED TO, READ BACK BY AND VERIFIED WITH: E. SMITH,RN AT 0454 ON [id number] BY Lucienne Capers    Culture   Final    STAPHYLOCOCCUS SPECIES (COAGULASE NEGATIVE) SUSCEPTIBILITIES PERFORMED ON PREVIOUS CULTURE WITHIN THE LAST 5 DAYS.    Report Status 03/06/2015 FINAL  Final  Culture, blood (Routine X 2) w Reflex to ID Panel     Status: None   Collection Time: 03/02/15 12:35 PM  Result Value Ref Range Status   Specimen Description BLOOD LEFT HAND  Final   Special Requests IN PEDIATRIC BOTTLE 1CCS  Final   Culture  Setup Time   Final    GRAM POSITIVE COCCI IN  CLUSTERS AEROBIC BOTTLE ONLY CRITICAL RESULT CALLED TO, READ BACK BY AND VERIFIED WITH: E SMITH 03/03/15 @ 0925 M VESTAL    Culture STAPHYLOCOCCUS SPECIES (COAGULASE NEGATIVE)  Final   Report Status 03/07/2015 FINAL  Final   Organism ID, Bacteria STAPHYLOCOCCUS SPECIES (COAGULASE NEGATIVE)  Final      Susceptibility   Staphylococcus species (coagulase negative) - MIC*    CIPROFLOXACIN >=8 RESISTANT Resistant     ERYTHROMYCIN >=8 RESISTANT Resistant     GENTAMICIN 8 INTERMEDIATE Intermediate     OXACILLIN >=4 RESISTANT Resistant     TETRACYCLINE >=16 RESISTANT Resistant     VANCOMYCIN 2 SENSITIVE Sensitive     TRIMETH/SULFA 160 RESISTANT Resistant     CLINDAMYCIN >=8 RESISTANT Resistant     RIFAMPIN <=0.5 SENSITIVE Sensitive     Inducible Clindamycin NEGATIVE Sensitive     * STAPHYLOCOCCUS SPECIES (COAGULASE NEGATIVE)  Culture, blood (routine x 2)     Status: None   Collection Time: 03/04/15 11:27 AM  Result Value Ref Range Status   Specimen Description BLOOD LEFT ANTECUBITAL  Final   Special Requests IN PEDIATRIC BOTTLE 3CC  Final   Culture  Setup Time   Final    GRAM POSITIVE COCCI IN CLUSTERS PEDIATRIC BOTTLE CRITICAL RESULT CALLED TO, READ BACK BY AND VERIFIED WITH: T. DONG,RN AT 1478 ON [id number] BY Lucienne Capers    Culture   Final    STAPHYLOCOCCUS SPECIES (COAGULASE NEGATIVE) SUSCEPTIBILITIES PERFORMED ON PREVIOUS CULTURE WITHIN THE LAST 5 DAYS.    Report Status 03/07/2015 FINAL  Final  Culture, blood (routine x 2)     Status: None (Preliminary result)   Collection Time: 03/04/15  1:45 PM  Result Value Ref Range Status   Specimen Description BLOOD LEFT ANTECUBITAL  Final   Special Requests   Final    BOTTLES DRAWN AEROBIC AND ANAEROBIC 5CC AER 4CC ANA   Culture NO GROWTH 4 DAYS  Final   Report Status PENDING  Incomplete  Culture, blood (routine x 2)     Status: None (Preliminary result)   Collection Time: 03/07/15  1:50 PM  Result Value Ref Range Status   Specimen  Description BLOOD RIGHT HAND  Final   Special Requests BOTTLES DRAWN AEROBIC ONLY 5CC  Final   Culture NO GROWTH 1 DAY  Final   Report Status PENDING  Incomplete  Culture, blood (routine x 2)     Status: None (Preliminary result)  Collection Time: 03/07/15  2:00 PM  Result Value Ref Range Status   Specimen Description BLOOD RIGHT HAND  Final   Special Requests PEDIATRICS 5CC  Final   Culture NO GROWTH 1 DAY  Final   Report Status PENDING  Incomplete     Studies: No results found.   Time spent: 30 minutes-Greater than 50% of this time was spent in counseling, explanation of diagnosis, planning of further management, and coordination of care.  I have directly reviewed the clinical findings, lab results and imaging studies. I have interviewed and examined the patient and agree with the documentation and management as recorded by the Physician extender.  Leroy Sea M.D on 03/09/2015 at 11:10 AM  [facility] Office  [phone]  03/09/2015, 10:38 AM  LOS: 13 days

## 2015-03-10 MED ORDER — HEPARIN SOD (PORK) LOCK FLUSH 100 UNIT/ML IV SOLN
250.0000 [IU] | INTRAVENOUS | Status: AC | PRN
  Administered 2015-03-10: 250 [IU]

## 2015-03-10 MED ORDER — ACETAMINOPHEN 160 MG/5ML PO SOLN
650.0000 mg | Freq: Once | ORAL | Status: AC
  Administered 2015-03-10: 650 mg via ORAL
  Filled 2015-03-10: qty 20.3

## 2015-03-10 MED ORDER — PANTOPRAZOLE SODIUM 40 MG PO TBEC: 40.0000 mg | DELAYED_RELEASE_TABLET | Freq: Two times a day (BID) | ORAL | Status: DC

## 2015-03-10 MED ORDER — VANCOMYCIN HCL IN DEXTROSE 1-5 GM/200ML-% IV SOLN: 1000.0000 mg | Freq: Three times a day (TID) | INTRAVENOUS | Status: DC

## 2015-03-10 NOTE — Discharge Summary (Signed)
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     * >=100,000 COLONIES/mL ENTEROCOCCUS SPECIES  Culture, blood (Routine X 2) w Reflex to ID Panel     Status: None   Collection Time: 03/02/15 12:18 PM  Result Value Ref Range Status   Specimen Description BLOOD LEFT ANTECUBITAL  Final   Special Requests BOTTLES DRAWN AEROBIC AND ANAEROBIC 5CCS  Final   Culture  Setup Time   Final    GRAM POSITIVE COCCI IN CLUSTERS AEROBIC BOTTLE ONLY CRITICAL RESULT CALLED TO, READ BACK BY AND VERIFIED WITH: E. SMITH,RN AT 4540 ON [id number] BY Lucienne Capers    Culture   Final    STAPHYLOCOCCUS SPECIES (COAGULASE NEGATIVE) SUSCEPTIBILITIES PERFORMED ON PREVIOUS CULTURE WITHIN THE LAST 5 DAYS.    Report Status 03/06/2015 FINAL  Final  Culture, blood (Routine X 2) w Reflex to ID Panel     Status:  None   Collection Time: 03/02/15 12:35 PM  Result Value Ref Range Status   Specimen Description BLOOD LEFT HAND  Final    Special Requests IN PEDIATRIC BOTTLE 1CCS  Final   Culture  Setup Time   Final    GRAM POSITIVE COCCI IN CLUSTERS AEROBIC BOTTLE ONLY CRITICAL RESULT CALLED TO, READ BACK BY AND VERIFIED WITH: E SMITH 03/03/15 @ 0925 M VESTAL    Culture STAPHYLOCOCCUS SPECIES (COAGULASE NEGATIVE)  Final   Report Status 03/07/2015 FINAL  Final   Organism ID, Bacteria STAPHYLOCOCCUS SPECIES (COAGULASE NEGATIVE)  Final      Susceptibility   Staphylococcus species (coagulase negative) - MIC*    CIPROFLOXACIN >=8 RESISTANT Resistant     ERYTHROMYCIN >=8 RESISTANT Resistant     GENTAMICIN 8 INTERMEDIATE Intermediate     OXACILLIN >=4 RESISTANT Resistant     TETRACYCLINE >=16 RESISTANT Resistant     VANCOMYCIN 2 SENSITIVE Sensitive     TRIMETH/SULFA 160 RESISTANT Resistant     CLINDAMYCIN >=8 RESISTANT Resistant     RIFAMPIN <=0.5 SENSITIVE Sensitive     Inducible Clindamycin NEGATIVE Sensitive     * STAPHYLOCOCCUS SPECIES (COAGULASE NEGATIVE)  Culture, blood (routine x 2)     Status: None   Collection Time: 03/04/15 11:27 AM  Result Value Ref Range Status   Specimen Description BLOOD LEFT ANTECUBITAL  Final   Special Requests IN PEDIATRIC BOTTLE 3CC  Final   Culture  Setup Time   Final    GRAM POSITIVE COCCI IN CLUSTERS PEDIATRIC BOTTLE CRITICAL RESULT CALLED TO, READ BACK BY AND VERIFIED WITH: T. DONG,RN AT 1610 ON [id number] BY Lucienne Capers    Culture   Final    STAPHYLOCOCCUS SPECIES (COAGULASE NEGATIVE) SUSCEPTIBILITIES PERFORMED ON PREVIOUS CULTURE WITHIN THE LAST 5 DAYS.    Report Status 03/07/2015 FINAL  Final  Culture, blood (routine x 2)     Status: None   Collection Time: 03/04/15  1:45 PM  Result Value Ref Range Status   Specimen Description BLOOD LEFT ANTECUBITAL  Final   Special Requests   Final    BOTTLES DRAWN AEROBIC AND ANAEROBIC 5CC AER 4CC ANA   Culture NO GROWTH 5 DAYS  Final   Report Status 03/09/2015 FINAL  Final  Culture, blood (routine x 2)     Status: None (Preliminary  result)   Collection Time: 03/07/15  1:50 PM  Result Value Ref Range Status   Specimen Description BLOOD RIGHT HAND  Final   Special Requests BOTTLES DRAWN AEROBIC ONLY 5CC  Final   Culture NO GROWTH 2 DAYS  Final   Report Status PENDING  Incomplete  Culture, blood (routine x 2)     Status: None (Preliminary result)   Collection Time: 03/07/15  2:00 PM  Result Value Ref Range Status   Specimen Description BLOOD RIGHT HAND  Final   Special Requests PEDIATRICS 5CC  Final   Culture NO GROWTH 2 DAYS  Final   Report Status PENDING  Incomplete    Today   Subjective    Rashawna Scoles today has no headache,no chest abdominal pain,no new weakness tingling or numbness, feels much better.    Objective   Blood pressure 118/77, pulse 86, temperature 98.6 F (37 C), temperature source Oral, resp. rate 18, height  (1.626 m), weight 101.8 kg (224 lb 6.9 oz), SpO2 97 %.  No intake or output data in the 24 hours ending 03/10/15 1015  Exam Awake Alert, Oriented x 3, No new  F.N deficits, Normal affect [place].AT,PERRAL Supple Neck,No JVD, No cervical lymphadenopathy appriciated.  Symmetrical Chest wall movement, Good air movement bilaterally, CTAB RRR,No Gallops,Rubs or new Murmurs, No Parasternal Heave +ve B.Sounds, Abd Soft, Non tender, No organomegaly appriciated, No rebound -guarding or rigidity. No Cyanosis, Clubbing or edema, No new Rash or bruise   Data Review   CBC w Diff:  Lab Results  Component Value Date   WBC 10.1 03/07/2015   WBC 7.7 08/20/2011   HGB 12.4 03/07/2015   HGB 13.4 08/20/2011   HCT 37.1 03/07/2015   HCT 38.7 08/20/2011   PLT 309 03/07/2015   PLT 269 08/20/2011   LYMPHOPCT 11 03/03/2015   MONOPCT 6 03/03/2015   EOSPCT 0 03/03/2015   BASOPCT 0 03/03/2015    CMP:  Lab Results  Component Value Date   NA 139 03/07/2015   NA 142 08/23/2011   K 3.9 03/07/2015   K 4.3 08/23/2011   CL 106 03/07/2015   CL 106 08/23/2011   CO2 25 03/07/2015   CO2 25  08/23/2011   BUN 19 03/07/2015   BUN 11 08/23/2011   CREATININE 0.66 03/07/2015   CREATININE 0.56* 08/23/2011   PROT 6.2* 02/27/2015   PROT 8.1 08/20/2011   ALBUMIN 3.0* 02/27/2015   ALBUMIN 4.1 08/20/2011   BILITOT 0.8 02/27/2015   BILITOT 0.4 08/20/2011   ALKPHOS 70 02/27/2015   ALKPHOS 69 08/20/2011   AST 46* 02/27/2015   AST 22 08/20/2011   ALT 36 02/27/2015   ALT 25 08/20/2011  .   Total Time in preparing paper work, data evaluation and todays exam - 35 minutes  Leroy Sea M.D on 03/10/2015 at 10:15 AM  [facility]   Office  [phone]

## 2015-03-10 NOTE — Clinical Social Work Note (Signed)
Patient discharged to [facility] in [place], [place] today (LOG). Patient transported to facility by [facility] transportation services. Patient will receive 5 days of antibiotic treatment and then will discharge home.     Genelle Bal, MSW, LCSW Licensed Clinical Social Worker Clinical Social Work Department [employer] [phone]

## 2015-03-10 NOTE — Progress Notes (Signed)
Nsg Discharge Note  Admit Date:  02/24/2015 Discharge date: 03/10/2015   Takeyla Million to be D/C'd SNF [facility] and rehab [place] per MD order.  Report given to [facility] Aide. No questions or concerns voiced with asked. RN informed facility that Wheelchair Zenaida Niece will be arriving to hospital around 4:30-5:30 PM to pick pt up to take her to SNF. RN placed order for IV team to redress PICC and Saline lock pt for discharge. Pt in room at this time, fiance at bedside. Bed low and locked. Bed alarm on. Call bell within reach. Will continue to monitor pt at this time.   Discharge Medication:   Medication List    STOP taking these medications        baclofen 10 MG tablet  Commonly known as:  LIORESAL     butalbital-acetaminophen-caffeine 50-325-40 MG tablet  Commonly known as:  FIORICET, ESGIC     cephALEXin 500 MG capsule  Commonly known as:  KEFLEX     clonazePAM 0.5 MG tablet  Commonly known as:  KLONOPIN     lisinopril 10 MG tablet  Commonly known as:  PRINIVIL,ZESTRIL     zolpidem 10 MG tablet  Commonly known as:  AMBIEN      TAKE these medications        cyanocobalamin 1000 MCG tablet  Take 1,000 mcg by mouth daily.     DULoxetine 60 MG capsule  Commonly known as:  CYMBALTA  Take 60 mg by mouth daily.     gabapentin 400 MG capsule  Commonly known as:  NEURONTIN  Take 3 capsules (1,200 mg total) by mouth 3 (three) times daily.     hydrochlorothiazide 25 MG tablet  Commonly known as:  HYDRODIURIL  Take 1 tablet by mouth daily.     levothyroxine 50 MCG tablet  Commonly known as:  SYNTHROID, LEVOTHROID  Take 50 mcg by mouth daily.     multivitamin with minerals Tabs tablet  Take 1 tablet by mouth every other day.     mupirocin ointment 2 %  Commonly known as:  BACTROBAN  Apply 1 application topically 3 (three) times daily as needed (for infection). For right knee     nortriptyline 25 MG capsule  Commonly known as:  PAMELOR  Take 1 capsule (25  mg total) by mouth 2 (two) times daily.     pantoprazole 40 MG tablet  Commonly known as:  PROTONIX  Take 1 tablet (40 mg total) by mouth 2 (two) times daily.     pravastatin 40 MG tablet  Commonly known as:  PRAVACHOL  Take 40 mg by mouth daily.     vancomycin 1 GM/200ML Soln  Commonly known as:  VANCOCIN  Inject 200 mLs (1,000 mg total) into the vein every 8 (eight) hours. Stop date 03/14/2015, SNF pharmacy to monitor peak and trough levels. Check BMP in 2 days.     VITAMIN D PO  Take 1 tablet by mouth every other day.        Discharge Assessment: Filed Vitals:   03/10/15 0832 03/10/15 1350  BP: 118/77 121/79  Pulse: 86 88  Temp: 98.6 F (37 C) 98.2 F (36.8 C)  Resp:      Tobin Chad, RN 03/10/2015 3:35 PM

## 2015-03-10 NOTE — Discharge Instructions (Signed)
Follow with Primary MD Evelene [name], MD in 7 days   Get CBC, CMP, 2 view Chest X ray checked  by Primary MD next visit.    Activity: As tolerated with Full fall precautions use walker/cane & assistance as needed   Disposition SNF   Diet:   Dysphagia 3 and nectar thick liquids with full feeding assistance and aspiration precautions  For Heart failure patients - Check your Weight same time everyday, if you gain over 2 pounds, or you develop in leg swelling, experience more shortness of breath or chest pain, call your Primary MD immediately. Follow Cardiac Low Salt Diet and 1.5 lit/day fluid restriction.   On your next visit with your primary care physician please Get Medicines reviewed and adjusted.   Please request your Prim.MD to go over all Hospital Tests and Procedure/Radiological results at the follow up, please get all Hospital records sent to your Prim MD by signing hospital release before you go home.   If you experience worsening of your admission symptoms, develop shortness of breath, life threatening emergency, suicidal or homicidal thoughts you must seek medical attention immediately by calling 911 or calling your MD immediately  if symptoms less severe.  You Must read complete instructions/literature along with all the possible adverse reactions/side effects for all the Medicines you take and that have been prescribed to you. Take any new Medicines after you have completely understood and accpet all the possible adverse reactions/side effects.   Do not drive, operating heavy machinery, perform activities at heights, swimming or participation in water activities or provide baby sitting services if your were admitted for syncope or siezures until you have seen by Primary MD or a Neurologist and advised to do so again.  Do not drive when taking Pain medications.    Do not take more than prescribed Pain, Sleep and Anxiety Medications  Special Instructions: If you have  smoked or chewed Tobacco  in the last 2 yrs please stop smoking, stop any regular Alcohol  and or any Recreational drug use.  Wear Seat belts while driving.   Please note  You were cared for by a hospitalist during your hospital stay. If you have any questions about your discharge medications or the care you received while you were in the hospital after you are discharged, you can call the unit and asked to speak with the hospitalist on call if the hospitalist that took care of you is not available. Once you are discharged, your primary care physician will handle any further medical issues. Please note that NO REFILLS for any discharge medications will be authorized once you are discharged, as it is imperative that you return to your primary care physician (or establish a relationship with a primary care physician if you do not have one) for your aftercare needs so that they can reassess your need for medications and monitor your lab values.

## 2015-03-10 NOTE — Clinical Social Work Note (Signed)
**Note Brittany-Identified via Obfuscation** Clinical Social Work Assessment  Patient Details  Name: Brittany [name] MRN: [id number] Date of Birth: [birth year]  Date of referral:  03/10/15               Reason for consult:  Facility Placement                Permission sought to share information with:  Chartered certified accountant granted to share information::  Yes, Verbal Permission Granted  Name::        Agency::     Relationship::     Contact Information:     Housing/Transportation Living arrangements for the past 2 months:  Single Family Home Source of Information:  Patient Patient Interpreter Needed:  None Criminal Activity/Legal Involvement Pertinent to Current Situation/Hospitalization:  No - Comment as needed Significant Relationships:  Significant Other Lives with:  Self Do you feel safe going back to the place where you live?  Yes Need for family participation in patient care:  No (Coment)  Care giving concerns:  CSW received referral for possible SNF placement at time of discharge. CSW met with patient regarding recommendation of SNF placement at time of discharge for IV antibiotics. Patient is currently unable to discharge home given patient's current IV needs. Patient expressed understanding of recommendation and is agreeable to SNF placement at time of discharge. CSW to continue to follow and assist with discharge planning needs.   Social Worker assessment / plan:  CSW spoke with patient concerning possibility of SNF before returning home.  Employment status:  Disabled (Comment on whether or not currently receiving Disability) Insurance information:  Medicaid [place] PT Recommendations:  Home with [place] / Referral to community resources:  [place]  Patient/Family's Response to care:  Patient recognizes need for SNF before returning home and is agreeable to a SNF in [place].   Patient/Family's Understanding of and Emotional Response to Diagnosis, Current  Treatment, and Prognosis:  Patient is realistic regarding needs. No questions/concerns about plan or treatment.    Emotional Assessment Appearance:  Appears stated age Attitude/Demeanor/Rapport:    Affect (typically observed):  Accepting Orientation:  Oriented to Self, Oriented to Place, Oriented to  Time, Oriented to Situation Alcohol / Substance use:  Illicit Drugs Psych involvement (Current and /or in the community):  Yes (Comment)  Discharge Needs  Concerns to be addressed:  Care Coordination Readmission within the last 30 days:  No Current discharge risk:  None Barriers to Discharge:  No Barriers Identified   Benard Halsted, [place] 03/10/2015, 11:23 AM

## 2015-03-10 NOTE — Progress Notes (Signed)
RN wheeled pt to the front entrance to [employer]. Pt got into front seat safely. [employer] driver buckled pt into the van.

## 2015-03-12 LAB — CULTURE, BLOOD (ROUTINE X 2)
CULTURE: NO GROWTH
CULTURE: NO GROWTH

## 2015-03-23 ENCOUNTER — Encounter (HOSPITAL_COMMUNITY): Payer: Self-pay | Admitting: Emergency Medicine

## 2015-03-23 ENCOUNTER — Observation Stay (HOSPITAL_COMMUNITY)
Admission: EM | Admit: 2015-03-23 | Discharge: 2015-03-27 | Disposition: A | Discharge status: Home / Self Care | Payer: Medicaid Other | Attending: Internal Medicine | Admitting: Internal Medicine

## 2015-03-23 DIAGNOSIS — T428X1A Poisoning by antiparkinsonism drugs and other central muscle-tone depressants, accidental (unintentional), initial encounter: Principal | ICD-10-CM | POA: Insufficient documentation

## 2015-03-23 DIAGNOSIS — E039 Hypothyroidism, unspecified: Secondary | ICD-10-CM | POA: Diagnosis present

## 2015-03-23 DIAGNOSIS — Z79899 Other long term (current) drug therapy: Secondary | ICD-10-CM

## 2015-03-23 DIAGNOSIS — R4182 Altered mental status, unspecified: Secondary | ICD-10-CM | POA: Diagnosis present

## 2015-03-23 DIAGNOSIS — Y92003 Bedroom of unspecified non-institutional (private) residence as the place of occurrence of the external cause: Secondary | ICD-10-CM | POA: Insufficient documentation

## 2015-03-23 DIAGNOSIS — F319 Bipolar disorder, unspecified: Secondary | ICD-10-CM | POA: Diagnosis present

## 2015-03-23 DIAGNOSIS — G9340 Encephalopathy, unspecified: Secondary | ICD-10-CM

## 2015-03-23 DIAGNOSIS — E7800 Pure hypercholesterolemia, unspecified: Secondary | ICD-10-CM | POA: Diagnosis not present

## 2015-03-23 DIAGNOSIS — G9341 Metabolic encephalopathy: Secondary | ICD-10-CM | POA: Insufficient documentation

## 2015-03-23 DIAGNOSIS — I10 Essential (primary) hypertension: Secondary | ICD-10-CM | POA: Insufficient documentation

## 2015-03-23 DIAGNOSIS — F419 Anxiety disorder, unspecified: Secondary | ICD-10-CM | POA: Diagnosis not present

## 2015-03-23 DIAGNOSIS — G894 Chronic pain syndrome: Secondary | ICD-10-CM | POA: Insufficient documentation

## 2015-03-23 DIAGNOSIS — R410 Disorientation, unspecified: Secondary | ICD-10-CM

## 2015-03-23 DIAGNOSIS — E870 Hyperosmolality and hypernatremia: Secondary | ICD-10-CM | POA: Insufficient documentation

## 2015-03-23 DIAGNOSIS — T50901A Poisoning by unspecified drugs, medicaments and biological substances, accidental (unintentional), initial encounter: Secondary | ICD-10-CM | POA: Diagnosis present

## 2015-03-23 HISTORY — DX: Unspecified chronic bronchitis: J42

## 2015-03-23 HISTORY — DX: Cardiac murmur, unspecified: R01.1

## 2015-03-23 HISTORY — DX: Unspecified convulsions: R56.9

## 2015-03-23 HISTORY — DX: Other chronic pain: G89.29

## 2015-03-23 HISTORY — DX: Hyperlipidemia, unspecified: E78.5

## 2015-03-23 HISTORY — DX: Gastro-esophageal reflux disease without esophagitis: K21.9

## 2015-03-23 HISTORY — DX: Low back pain: M54.5

## 2015-03-23 HISTORY — DX: Other intervertebral disc displacement, lumbar region: M51.26

## 2015-03-23 HISTORY — DX: Low back pain, unspecified: M54.50

## 2015-03-23 HISTORY — DX: Other intervertebral disc degeneration, lumbar region: M51.36

## 2015-03-23 HISTORY — DX: Other intervertebral disc degeneration, lumbar region without mention of lumbar back pain or lower extremity pain: M51.369

## 2015-03-23 LAB — I-STAT ARTERIAL BLOOD GAS, ED
Bicarbonate: 24.4 mEq/L — ABNORMAL HIGH (ref 20.0–24.0)
O2 Saturation: 94 %
Patient temperature: 98.5
TCO2: 26 mmol/L (ref 0–100)
pCO2 arterial: 39 mmHg (ref 35.0–45.0)
pH, Arterial: 7.403 (ref 7.350–7.450)
pO2, Arterial: 69 mmHg — ABNORMAL LOW (ref 80.0–100.0)

## 2015-03-23 LAB — COMPREHENSIVE METABOLIC PANEL
ALT: 49 U/L (ref 14–54)
AST: 37 U/L (ref 15–41)
Albumin: 3.9 g/dL (ref 3.5–5.0)
Alkaline Phosphatase: 103 U/L (ref 38–126)
Anion gap: 15 (ref 5–15)
BUN: 6 mg/dL (ref 6–20)
CO2: 24 mmol/L (ref 22–32)
Calcium: 9.1 mg/dL (ref 8.9–10.3)
Chloride: 110 mmol/L (ref 101–111)
Creatinine, Ser: 0.71 mg/dL (ref 0.44–1.00)
GFR calc Af Amer: >60 mL/min (ref >60)
GFR calc non Af Amer: >60 mL/min (ref >60)
Glucose, Bld: 97 mg/dL (ref 65–99)
Potassium: 3.8 mmol/L (ref 3.5–5.1)
Sodium: 149 mmol/L — ABNORMAL HIGH (ref 135–145)
Total Bilirubin: 0.8 mg/dL (ref 0.3–1.2)
Total Protein: 6.7 g/dL (ref 6.5–8.1)

## 2015-03-23 LAB — ACETAMINOPHEN LEVEL: Acetaminophen (Tylenol), Serum: <10 ug/mL — ABNORMAL LOW (ref 10–30)

## 2015-03-23 LAB — URINALYSIS, ROUTINE W REFLEX MICROSCOPIC
Bilirubin Urine: NEGATIVE
Glucose, UA: NEGATIVE mg/dL
Hgb urine dipstick: NEGATIVE
Ketones, ur: NEGATIVE mg/dL
Leukocytes, UA: NEGATIVE
Nitrite: NEGATIVE
Protein, ur: NEGATIVE mg/dL
Specific Gravity, Urine: 1.011 (ref 1.005–1.030)
pH: 7 (ref 5.0–8.0)

## 2015-03-23 LAB — CBC WITH DIFFERENTIAL/PLATELET
Basophils Absolute: 0.1 10*3/uL (ref 0.0–0.1)
Basophils Relative: 1 %
Eosinophils Absolute: 0.2 10*3/uL (ref 0.0–0.7)
Eosinophils Relative: 2 %
HCT: 35.3 % — ABNORMAL LOW (ref 36.0–46.0)
Hemoglobin: 10.9 g/dL — ABNORMAL LOW (ref 12.0–15.0)
Lymphocytes Relative: 14 %
Lymphs Abs: 1.3 10*3/uL (ref 0.7–4.0)
MCH: 30.6 pg (ref 26.0–34.0)
MCHC: 30.9 g/dL (ref 30.0–36.0)
MCV: 99.2 fL (ref 78.0–100.0)
Monocytes Absolute: 0.6 10*3/uL (ref 0.1–1.0)
Monocytes Relative: 7 %
Neutro Abs: 7.3 10*3/uL (ref 1.7–7.7)
Neutrophils Relative %: 76 %
Platelets: 223 10*3/uL (ref 150–400)
RBC: 3.56 MIL/uL — ABNORMAL LOW (ref 3.87–5.11)
RDW: 15.1 % (ref 11.5–15.5)
WBC: 9.5 10*3/uL (ref 4.0–10.5)

## 2015-03-23 LAB — RAPID URINE DRUG SCREEN, HOSP PERFORMED
Amphetamines: NOT DETECTED
Barbiturates: NOT DETECTED
Benzodiazepines: POSITIVE — AB
Cocaine: NOT DETECTED
Opiates: NOT DETECTED
Tetrahydrocannabinol: POSITIVE — AB

## 2015-03-23 LAB — TROPONIN I: Troponin I: <0.03 ng/mL (ref <0.031)

## 2015-03-23 LAB — I-STAT CG4 LACTIC ACID, ED
Lactic Acid, Venous: 1.53 mmol/L (ref 0.5–2.0)
Lactic Acid, Venous: 1.26 mmol/L (ref 0.5–2.0)

## 2015-03-23 LAB — ETHANOL: Alcohol, Ethyl (B): <5 mg/dL (ref <5)

## 2015-03-23 LAB — SALICYLATE LEVEL: Salicylate Lvl: <4 mg/dL (ref 2.8–30.0)

## 2015-03-23 LAB — MAGNESIUM
Magnesium: 2 mg/dL (ref 1.7–2.4)
Magnesium: 2.1 mg/dL (ref 1.7–2.4)

## 2015-03-23 LAB — CK: CK TOTAL: 166 U/L (ref 38–234)

## 2015-03-23 MED ORDER — THIAMINE HCL 100 MG/ML IJ SOLN
100.0000 mg | Freq: Every day | INTRAMUSCULAR | Status: DC
  Filled 2015-03-23 (×2): qty 2

## 2015-03-23 MED ORDER — ADULT MULTIVITAMIN W/MINERALS CH
1.0000 | ORAL_TABLET | Freq: Every day | ORAL | Status: DC
  Administered 2015-03-23 – 2015-03-27 (×5): 1 via ORAL
  Filled 2015-03-23 (×5): qty 1

## 2015-03-23 MED ORDER — DIAZEPAM 5 MG/ML IJ SOLN: 10.0000 mg | Freq: Once | INTRAMUSCULAR | Status: DC

## 2015-03-23 MED ORDER — LORAZEPAM 2 MG/ML IJ SOLN: 1.0000 mg | Freq: Four times a day (QID) | INTRAMUSCULAR | Status: DC | PRN

## 2015-03-23 MED ORDER — VITAMIN B-1 100 MG PO TABS
100.0000 mg | ORAL_TABLET | Freq: Every day | ORAL | Status: DC
  Administered 2015-03-23 – 2015-03-27 (×5): 100 mg via ORAL
  Filled 2015-03-23 (×5): qty 1

## 2015-03-23 MED ORDER — SODIUM BICARBONATE 8.4 % IV SOLN
50.0000 meq | Freq: Once | INTRAVENOUS | Status: AC
  Administered 2015-03-23: 50 meq via INTRAVENOUS
  Filled 2015-03-23: qty 50

## 2015-03-23 MED ORDER — SODIUM CHLORIDE 0.9 % IV BOLUS (SEPSIS)
1000.0000 mL | Freq: Once | INTRAVENOUS | Status: AC
  Administered 2015-03-23: 1000 mL via INTRAVENOUS

## 2015-03-23 MED ORDER — ENOXAPARIN SODIUM 40 MG/0.4ML ~~LOC~~ SOLN
40.0000 mg | SUBCUTANEOUS | Status: DC
  Administered 2015-03-23 – 2015-03-26 (×3): 40 mg via SUBCUTANEOUS
  Filled 2015-03-23 (×4): qty 0.4

## 2015-03-23 MED ORDER — SODIUM CHLORIDE 0.45 % IV SOLN
INTRAVENOUS | Status: DC
  Administered 2015-03-23 – 2015-03-24 (×2): via INTRAVENOUS

## 2015-03-23 MED ORDER — FOLIC ACID 1 MG PO TABS
1.0000 mg | ORAL_TABLET | Freq: Every day | ORAL | Status: DC
  Administered 2015-03-23 – 2015-03-27 (×5): 1 mg via ORAL
  Filled 2015-03-23 (×5): qty 1

## 2015-03-23 MED ORDER — LORAZEPAM 1 MG PO TABS
1.0000 mg | ORAL_TABLET | Freq: Four times a day (QID) | ORAL | Status: DC | PRN
  Administered 2015-03-24: 1 mg via ORAL
  Filled 2015-03-23: qty 1

## 2015-03-23 NOTE — ED Notes (Signed)
Pt alert, pt able to stay awake. Passed swallow screen. Per order pt can eat. Gave pt [place] sandwich and ginerale

## 2015-03-23 NOTE — ED Notes (Signed)
RN spoke with pt's fiance on the phone. Fiance reports that pt had her bacolfen filled yesterday with 90 tablets. There are 52 tablets left. Fiance states she also took nortriptyline but unsure of how many. Fiance reports pt started taking the pills throughout the afternoon to help with her trigeminal neuralgia and help her sleep. Pt went to bed around 6pm. Fiance states pt was "out of it all night." This morning pt could not put together a coherent sentence. RN asked if pt was SI yesterday when taking pills. Fiance adamant that pt was not- he states pt has a lot to live for, she is getting married to him soon and she has a daughter. Fiance states they were having a very good day yesterday. RN questioned why someone would take so many pills if they were in their right mind and not upset. Fiance states sometimes she overmedicates and enjoys the high of medicines. Fiance states he has done this before as well. RN notified admitting MD. Admitting MD requests SI sitter at this time. Pt sleeping.

## 2015-03-23 NOTE — ED Notes (Signed)
Sitter at bedside.

## 2015-03-23 NOTE — ED Notes (Addendum)
RN called for [employer]. Security wanded pt

## 2015-03-23 NOTE — Progress Notes (Signed)
Patient's belongings were placed in the soiled utility room on [address].  Patient label placed on bag clothes are in

## 2015-03-23 NOTE — ED Notes (Signed)
Pt noted not to be as alert now as she was when arrived to ED. Pt is resting.

## 2015-03-23 NOTE — ED Notes (Signed)
Pt went to bed around 6pm. Pt's fiance told EMS that pt was "out of it" all night. Pt had taken a lot of her medications last night- pt is not SI. Pt's fiance told EMS she combines a lot of meds to sleep. Pt combined gabapentin, nortriptyline, and HCTZ last night and took "a lot" per fiance.This morning pt had incoherent speech, saying words that make no sense,slurring words. Pt was combative. EMS arrived this morning. Pt is covered in feces- pt thinks it's mud. EMS gave total of  versed. CBG 222, BP 152/68  HR 130. Pt alert after versed, calm upon arrival to ED.

## 2015-03-23 NOTE — ED Notes (Signed)
[facility] was paged out for pt intitally to admit pt, but Dr. Thedore Mins discharged the pt approx 2 weeks ago..so the pt is to go back with [facility]. Dr. Thedore Mins has been paged.

## 2015-03-23 NOTE — ED Provider Notes (Signed)
CSN: [id number]     Arrival date & time 03/23/15  0907 History   First MD Initiated Contact with Patient 03/23/15 [id number]     Chief Complaint  Patient presents with  . Altered Mental Status     (Consider location/radiation/quality/duration/timing/severity/associated sxs/prior Treatment) HPI   49yF brought in by EMS with altered mental status. History from EMS and review of records. Coming from home. Lives with fiance. Last seen normal around 6:30 pm when laid down in bed. Found this morning around 6:30 am still in bed when fiance tried getting her up. She was incontinent of stool and very confused to the point that she did not recognize him. EMS reports numerous pills bottles found on scene. Fiance told EMS that she requires a lot of medication to sleep. Reported that last night patient took "a lot" of gabapentin, nortriptyline, flexeril and HCTZ. Unable to quantify how much specifically. No reported trauma. Pt uncooperative for EMS and required versed  IM x2.   Past Medical History  Diagnosis Date  . Hypertension   . Bipolar 1 disorder (HCC)   . Anxiety   . Back pain   . Bulging disc   . Ruptured intervertebral disc   . Migraine   . Head injury   . Short-term memory loss   . Depression   . Hypothyroidism   . Goiter   . Facial pain   . Facial nerve injury    Past Surgical History  Procedure Laterality Date  . Abdominal hysterectomy     Family History  Problem Relation Age of Onset  . Migraines Mother   . Migraines Father   . Other Father   . Migraines Sister    Social History  Substance Use Topics  . Smoking status: Never Smoker   . Smokeless tobacco: Never Used  . Alcohol Use: No   OB History    No data available     Review of Systems  Level 5 caveat because of encephalopathy.   Allergies  Toradol; Suboxone; Tegretol; and Trazodone and nefazodone  Home Medications   Prior to Admission medications   Medication Sig Start Date End Date Taking? Authorizing  Provider  Cholecalciferol (VITAMIN D PO) Take 1 tablet by mouth every other day.    Historical Provider, MD  cyanocobalamin 1000 MCG tablet Take 1,000 mcg by mouth daily.    Historical Provider, MD  DULoxetine (CYMBALTA) 60 MG capsule Take 60 mg by mouth daily.    Historical Provider, MD  gabapentin (NEURONTIN) 400 MG capsule Take 3 capsules (1,200 mg total) by mouth 3 (three) times daily. 01/05/15   Levert Feinstein, MD  hydrochlorothiazide (HYDRODIURIL) 25 MG tablet Take 1 tablet by mouth daily. 12/16/14   Historical Provider, MD  levothyroxine (SYNTHROID, LEVOTHROID) 50 MCG tablet Take 50 mcg by mouth daily.    Historical Provider, MD  Multiple Vitamin (MULTIVITAMIN WITH MINERALS) TABS tablet Take 1 tablet by mouth every other day.    Historical Provider, MD  mupirocin ointment (BACTROBAN) 2 % Apply 1 application topically 3 (three) times daily as needed (for infection). For right knee    Historical Provider, MD  nortriptyline (PAMELOR) 25 MG capsule Take 1 capsule (25 mg total) by mouth 2 (two) times daily. 01/05/15   Levert Feinstein, MD  pantoprazole (PROTONIX) 40 MG tablet Take 1 tablet (40 mg total) by mouth 2 (two) times daily. 03/10/15   Leroy Sea, MD  pravastatin (PRAVACHOL) 40 MG tablet Take 40 mg by mouth daily.  Historical Provider, MD  vancomycin (VANCOCIN) 1 GM/200ML SOLN Inject 200 mLs (1,000 mg total) into the vein every 8 (eight) hours. Stop date 03/14/2015, SNF pharmacy to monitor peak and trough levels. Check BMP in 2 days. 03/10/15   Leroy Sea, MD   BP 145/101 mmHg  Temp(Src) 100.2 F (37.9 C) (Oral)  Resp 14  SpO2 95% Physical Exam  Constitutional: She appears well-developed and well-nourished. No distress.  Sitting in bed. Babbling incoherently. Dried feces on clothes.   HENT:  Head: Normocephalic and atraumatic.  Eyes: Conjunctivae and EOM are normal. Right eye exhibits no discharge. Left eye exhibits no discharge.  Pupils dilated, symmetric. Reactive. Gaze conjugate.    Neck: Neck supple.  Cardiovascular: Regular rhythm.  Exam reveals no gallop and no friction rub.   No murmur heard. tachycardic  Pulmonary/Chest: Effort normal and breath sounds normal. No respiratory distress.  Abdominal: Soft. She exhibits no distension. There is no tenderness.  Musculoskeletal: She exhibits no edema or tenderness.  Neurological:  Rambling incoherently. Will respond to questions but answers nonsensical. Occasionally laughing inappropriately. Will follow simple commands but needs constant encouragement because she doesn't maintain focus on task. CN 2-12 seems intact. Strength 5/5 b/l extremities. Tone does seem increased. No hyperreflexia. No inducible clonus.   Skin: Skin is warm and dry.  Nursing note and vitals reviewed.   ED Course  Procedures (including critical care time)  CRITICAL CARE Performed by: Raeford Razor Total critical care time: 35 minutes Critical care time was exclusive of separately billable procedures and treating other patients. Critical care was necessary to treat or prevent imminent or life-threatening deterioration. Critical care was time spent personally by me on the following activities: development of treatment plan with patient and/or surrogate as well as nursing, discussions with consultants, evaluation of patient's response to treatment, examination of patient, obtaining history from patient or surrogate, ordering and performing treatments and interventions, ordering and review of laboratory studies, ordering and review of radiographic studies, pulse oximetry and re-evaluation of patient's condition. c Labs Review Labs Reviewed  CBC WITH DIFFERENTIAL/PLATELET - Abnormal; Notable for the following:    RBC 3.56 (*)    Hemoglobin 10.9 (*)    HCT 35.3 (*)    All other components within normal limits  COMPREHENSIVE METABOLIC PANEL - Abnormal; Notable for the following:    Sodium 149 (*)    All other components within normal limits   ACETAMINOPHEN LEVEL - Abnormal; Notable for the following:    Acetaminophen (Tylenol), Serum <10 (*)    All other components within normal limits  URINALYSIS, ROUTINE W REFLEX MICROSCOPIC (NOT AT [facility]) - Abnormal; Notable for the following:    APPearance HAZY (*)    All other components within normal limits  URINE RAPID DRUG SCREEN, HOSP PERFORMED - Abnormal; Notable for the following:    Benzodiazepines POSITIVE (*)    Tetrahydrocannabinol POSITIVE (*)    All other components within normal limits  I-STAT ARTERIAL BLOOD GAS, ED - Abnormal; Notable for the following:    pO2, Arterial 69.0 (*)    Bicarbonate 24.4 (*)    All other components within normal limits  SALICYLATE LEVEL  MAGNESIUM  ETHANOL  TROPONIN I  I-STAT CG4 LACTIC ACID, ED    Imaging Review No results found. I have personally reviewed and evaluated these images and lab results as part of my medical decision-making.   EKG Interpretation   Date/Time:  Tuesday March 23 2015 09:18:13 EST Ventricular Rate:  132 PR Interval:  129 QRS Duration: 90 QT Interval:  352 QTC Calculation: 522 R Axis:   113 Text Interpretation:  Sinus tachycardia Right axis deviation Low voltage,  precordial leads Prolonged QT interval Confirmed by Juleen China  MD, See Beharry  ([id number]) on 03/23/2015 9:55:05 AM      MDM   Final diagnoses:  Acute encephalopathy  Drug overdose, accidental or unintentional, initial encounter    49yF with altered mental status. Suspect drug induced/overdose.  Anticholinergic symptoms. (dilated pupils, dry mucus membranes, tachycardic, confused/agitated, temp 100.2). Consider serotonin syndrome as she also reportedly took nortriptyline and also has cymbalta on medication list. Her tone seems somewhat increased but no inducible clonus and doesn't seem hyperreflexic.   I am not concerned about her airway currently.  In terms of possible TCA overdose. EKG shows sinus tachycardia. QT prolonged. QRS is <100 ms. Bicarb  deferred at this time. Will monitor. Benzos for agitation. Tylenol/salicylate levels as well as CBC and CMP. UA/UDS. Will cath for urine. Foley deferred but need to monitor for possible urinary retention. Temp is elevated, but I doubt infectious at this point.   10:18 AM Pt now less agitated. Laying in bed staring off into space. Will respond to stimuli and questions but answers remain nonsensical.   Repeat EKG with QRS now over . Dose bicarb and reassess.    Raeford Razor, MD 03/23/15 [phone]

## 2015-03-23 NOTE — H&P (Signed)
[facility] History and Physical  Brittany [name] QMV:[id number] DOB: 04/22/1965 DOA: 03/23/2015  Referring physician:  PCP: Brittany [name], MD   Chief Complaint: Acute Mental status changes due to OD  HPI: Brittany [name] is a 50 y.o. female  Recently hospitalized from 03/07/2015 through 03/10/2015 due to altered mental status due to intentional drug overdose with subsequent acute hypoxic and height current cardiac respiratory failure requiring intubation, Bipolar disorder, chronic pain syndrome among other medical issues listed below, brought by EMS today after fiance found her unresponsive. History is obtained chart, as no family members are present and patient is unable to interact. Level 5 Caveat. Per chart report, she went to sleep around 6:30 PM after taking "about 38 of baclofen" and surrounded by a bottle of HCTZ, gabapentin and nortriptyline of unknown dose. She was not suicidal. She partakes marijuana. When found, she was incoherent, combative and stool incontinent. She was midriatic. EMS gave total 10 mg Versed.  VSS. Temp around 100.2, normal ABG, hypoxic, normal lactic acid. CBG 222. EKG with nl QRS and QTC 474 after bicarb. She has had multiple behavioral admissions dating back to 2011.   ROS Unable to obtain, patient is not responsive due to oversedation, no family at bedside  Past Medical History  Diagnosis Date  . Hypertension   . Bipolar 1 disorder (HCC)   . Anxiety   . Back pain   . Bulging disc   . Ruptured intervertebral disc   . Migraine   . Head injury   . Short-term memory loss   . Depression   . Hypothyroidism   . Goiter   . Facial pain   . Facial nerve injury    Past Surgical History  Procedure Laterality Date  . Abdominal hysterectomy     Social History:  reports that she has never smoked. She has never used smokeless tobacco. She reports that she uses illicit drugs (Marijuana). She reports that she does not drink alcohol.  Allergies    Allergen Reactions  . Toradol [Ketorolac Tromethamine] Swelling  . Suboxone [Buprenorphine Hcl-Naloxone Hcl]     cellulitis   . Tegretol [Carbamazepine] Rash  . Trazodone And Nefazodone Other (See Comments)    Bladder infection    Family History  Problem Relation Age of Onset  . Migraines Mother   . Migraines Father   . Other Father   . Migraines Sister      Prior to Admission medications   Medication Sig Start Date End Date Taking? Authorizing Provider  Cholecalciferol (VITAMIN D PO) Take 1 tablet by mouth every other day.    Historical Provider, MD  cyanocobalamin 1000 MCG tablet Take 1,000 mcg by mouth daily.    Historical Provider, MD  DULoxetine (CYMBALTA) 60 MG capsule Take 60 mg by mouth daily.    Historical Provider, MD  gabapentin (NEURONTIN) 400 MG capsule Take 3 capsules (1,200 mg total) by mouth 3 (three) times daily. 01/05/15   Levert Feinstein, MD  hydrochlorothiazide (HYDRODIURIL) 25 MG tablet Take 1 tablet by mouth daily. 12/16/14   Historical Provider, MD  levothyroxine (SYNTHROID, LEVOTHROID) 50 MCG tablet Take 50 mcg by mouth daily.    Historical Provider, MD  Multiple Vitamin (MULTIVITAMIN WITH MINERALS) TABS tablet Take 1 tablet by mouth every other day.    Historical Provider, MD  mupirocin ointment (BACTROBAN) 2 % Apply 1 application topically 3 (three) times daily as needed (for infection). For right knee    Historical Provider, MD  nortriptyline (PAMELOR) 25 MG capsule Take  1 capsule (25 mg total) by mouth 2 (two) times daily. 01/05/15   Levert Feinstein, MD  pantoprazole (PROTONIX) 40 MG tablet Take 1 tablet (40 mg total) by mouth 2 (two) times daily. 03/10/15   Leroy Sea, MD  pravastatin (PRAVACHOL) 40 MG tablet Take 40 mg by mouth daily.    Historical Provider, MD  vancomycin (VANCOCIN) 1 GM/200ML SOLN Inject 200 mLs (1,000 mg total) into the vein every 8 (eight) hours. Stop date 03/14/2015, SNF pharmacy to monitor peak and trough levels. Check BMP in 2 days.  03/10/15   Leroy Sea, MD   Physical Exam: Filed Vitals:   03/23/15 1000 03/23/15 1030 03/23/15 1100 03/23/15 1115  BP: 140/101 153/99 155/94 140/85  Pulse: 119 110 109 108  Temp:      TempSrc:      Resp: SpO2: 93% 92% 93% 92%    Wt Readings from Last 3 Encounters:  03/03/15 101.8 kg (224 lb 6.9 oz)  01/05/15 104.327 kg (230 lb)  12/13/13 88.451 kg (195 lb)    Physical Exam  Constitutional: She is well-developed, well-nourished, and in no distress.  Sound asleep, sedated  HENT:  Head: Normocephalic and atraumatic.  Mouth/Throat: No oropharyngeal exudate.  Eyes: No scleral icterus.  Dilated pupils  Neck: Neck supple. No tracheal deviation present. No thyromegaly present.  Cardiovascular: Normal rate, regular rhythm and normal heart sounds.  Exam reveals no gallop and no friction rub.   No murmur heard. Pulmonary/Chest: Effort normal and breath sounds normal.  Abdominal: Soft. Bowel sounds are normal.  obese  Musculoskeletal: She exhibits no edema.  Neurological:  Non-interactive. Cannot assess, cannot follow commands  Skin: Skin is dry.            Labs on Admission:  Basic Metabolic Panel:  Recent Labs Lab 03/23/15 0951  NA 149*  K 3.8  CL 110  CO2 24  GLUCOSE 97  BUN 6  CREATININE 0.71  CALCIUM 9.1  MG 2.0    Liver Function Tests:  Recent Labs Lab 03/23/15 0951  AST 37  ALT 49  ALKPHOS 103  BILITOT 0.8  PROT 6.7  ALBUMIN 3.9   No results for input(s): LIPASE, AMYLASE in the last 168 hours. No results for input(s): AMMONIA in the last 168 hours.  CBC:  Recent Labs Lab 03/23/15 0951  WBC 9.5  NEUTROABS 7.3  HGB 10.9*  HCT 35.3*  MCV 99.2  PLT 223    Cardiac Enzymes:  Recent Labs Lab 03/23/15 0951  TROPONINI <0.03    Radiological Exams on Admission: No results found.   ZOX:[id number] prolonged QT interva, QTC 496    Assessment/Plan Principal Problem:   Drug overdose Active Problems:    Hypothyroidism   Polypharmacy   Bipolar affective (HCC)   Acute Confusional State Likely drug induced. Latest CT head on 02/24/15  is negative for acute intracranial abnormalities. SHe was recently admitted with similar symptoms due to overdose requiring intubation Admit to tele due to abnormal EKG (see below) Sitter at bedside, suicide precautions Spoke with Poison control, recommending close QTs and respiratory status observation, as well as close monitoring of the Potassium and Magnesium levels with replenishment if low. Current K is at 3.8 and Mg 2.0, normal Check bladder scan to rule out any urine retention CIWA protocol Recommnend admission to [facility] once patient is stabilized during the next 24 hrs.  Behavioral Issues She has a history of Bipolar d/o, anxiety, pain syndrome and depression,  with multiple narcotic, relaxers, antidepressants and anxiolytics Poison control recommends avoidance of Haldol and Geodon due to QT, K and Mg possible abnormalities as mentioned above, and if sedation is needed, Benzodiazepines are preferred Will contact Behavioral medicine today for consult and for eventual admission to [facility] once stabilized.  Chronic Pain Will reevaluate once patient wakes up during this admission  Prolongued QT interval. EKG shows latest QTC at 496 Tn neg at 0.03 Continue to monitor with EKGs and QTs  Hypothyroidism. TSH on 1/11 was normal at 1.211 Will hold Synthroid until patient wakes up  Hypercholesterolemia patient is on Pravachol, will hold until the patient wakes up    Hypernatremia - sodium 147, recent NA at 150 -Check a.m. CMP IVF 1/2 NS   Code Status: Full Code  DVT Prophylaxis: Lovenox Family Communication: No family at bedside Disposition Plan: Pending Improvement. Admitted for observation in tele bed. Expected LOS 24 hrs   [facility] E,PA-C [facility] [email or web address] Password [email or web address]

## 2015-03-23 NOTE — ED Notes (Signed)
Pt woke up for foley insertion. Pt tolerated well. Pt alert and able,but still confused. Pt unable to put together coherent sentence.

## 2015-03-23 NOTE — ED Notes (Signed)
RN and NT bathed pt in bed. Pt completely covered in feces. Pt cooperative

## 2015-03-24 DIAGNOSIS — T428X1A Poisoning by antiparkinsonism drugs and other central muscle-tone depressants, accidental (unintentional), initial encounter: Principal | ICD-10-CM

## 2015-03-24 DIAGNOSIS — G9340 Encephalopathy, unspecified: Secondary | ICD-10-CM | POA: Diagnosis not present

## 2015-03-24 DIAGNOSIS — F3130 Bipolar disorder, current episode depressed, mild or moderate severity, unspecified: Secondary | ICD-10-CM | POA: Diagnosis not present

## 2015-03-24 DIAGNOSIS — F314 Bipolar disorder, current episode depressed, severe, without psychotic features: Secondary | ICD-10-CM

## 2015-03-24 DIAGNOSIS — Z79899 Other long term (current) drug therapy: Secondary | ICD-10-CM | POA: Diagnosis not present

## 2015-03-24 DIAGNOSIS — E039 Hypothyroidism, unspecified: Secondary | ICD-10-CM | POA: Diagnosis not present

## 2015-03-24 DIAGNOSIS — T50901D Poisoning by unspecified drugs, medicaments and biological substances, accidental (unintentional), subsequent encounter: Secondary | ICD-10-CM | POA: Diagnosis not present

## 2015-03-24 DIAGNOSIS — T50901A Poisoning by unspecified drugs, medicaments and biological substances, accidental (unintentional), initial encounter: Secondary | ICD-10-CM | POA: Diagnosis not present

## 2015-03-24 LAB — CBC
HCT: 31.2 % — ABNORMAL LOW (ref 36.0–46.0)
Hemoglobin: 9.7 g/dL — ABNORMAL LOW (ref 12.0–15.0)
MCH: 30.2 pg (ref 26.0–34.0)
MCHC: 31.1 g/dL (ref 30.0–36.0)
MCV: 97.2 fL (ref 78.0–100.0)
PLATELETS: 217 10*3/uL (ref 150–400)
RBC: 3.21 MIL/uL — ABNORMAL LOW (ref 3.87–5.11)
RDW: 15.2 % (ref 11.5–15.5)
WBC: 8.3 10*3/uL (ref 4.0–10.5)

## 2015-03-24 LAB — COMPREHENSIVE METABOLIC PANEL
ALT: 41 U/L (ref 14–54)
AST: 26 U/L (ref 15–41)
Albumin: 3.4 g/dL — ABNORMAL LOW (ref 3.5–5.0)
Alkaline Phosphatase: 92 U/L (ref 38–126)
Anion gap: 13 (ref 5–15)
BILIRUBIN TOTAL: 1.1 mg/dL (ref 0.3–1.2)
CHLORIDE: 103 mmol/L (ref 101–111)
CO2: 24 mmol/L (ref 22–32)
CREATININE: 0.76 mg/dL (ref 0.44–1.00)
Calcium: 9 mg/dL (ref 8.9–10.3)
GFR calc Af Amer: >60 mL/min (ref >60)
Glucose, Bld: 97 mg/dL (ref 65–99)
Potassium: 3.2 mmol/L — ABNORMAL LOW (ref 3.5–5.1)
Sodium: 140 mmol/L (ref 135–145)
TOTAL PROTEIN: 6.1 g/dL — AB (ref 6.5–8.1)

## 2015-03-24 LAB — MAGNESIUM: Magnesium: 1.9 mg/dL (ref 1.7–2.4)

## 2015-03-24 MED ORDER — IBUPROFEN 600 MG PO TABS
ORAL_TABLET | ORAL | Status: AC
  Filled 2015-03-24: qty 1

## 2015-03-24 MED ORDER — GABAPENTIN 800 MG PO TABS
400.0000 mg | ORAL_TABLET | Freq: Three times a day (TID) | ORAL | Status: DC
  Filled 2015-03-24 (×2): qty 0.5

## 2015-03-24 MED ORDER — POTASSIUM CHLORIDE CRYS ER 20 MEQ PO TBCR
40.0000 meq | EXTENDED_RELEASE_TABLET | Freq: Once | ORAL | Status: AC
  Administered 2015-03-24: 40 meq via ORAL
  Filled 2015-03-24: qty 2

## 2015-03-24 MED ORDER — GABAPENTIN 400 MG PO CAPS
400.0000 mg | ORAL_CAPSULE | Freq: Three times a day (TID) | ORAL | Status: DC
  Administered 2015-03-24 – 2015-03-25 (×4): 400 mg via ORAL
  Filled 2015-03-24 (×4): qty 1

## 2015-03-24 MED ORDER — ACETAMINOPHEN 325 MG PO TABS
650.0000 mg | ORAL_TABLET | Freq: Once | ORAL | Status: AC
  Administered 2015-03-24: 650 mg via ORAL
  Filled 2015-03-24: qty 2

## 2015-03-24 MED ORDER — ACETAMINOPHEN 325 MG PO TABS
ORAL_TABLET | ORAL | Status: AC
  Administered 2015-03-24: 650 mg via ORAL
  Filled 2015-03-24: qty 2

## 2015-03-24 NOTE — Progress Notes (Signed)
[facility] PROGRESS NOTE  Brittany [name] WUJ:[id number] DOB: Jan 05, 1966 DOA: 03/23/2015 PCP: Evelene [name], MD  Assessment/Plan: 1. Metabolic encephalopathy -due to drug OD -resolved -denies SI, Psych to see, recent admission with same -QTC still prolonged, caution with meds, repeat this pm -replace K, mag is ok  2. Chronic pain?Neuropathy -on Baclofen/nortriptyline/Gabpentin -baclofen stopped, resume Gabapentin at lower dose  3. Bipolar d/o, anxiety/Depression -denies SI -Psych to eval  DVT proph: lovenox  Code Status: FUll COde Family Communication: none at bedside Disposition Plan: based on psych eval   Consultants:  Pstchiatry  HPI/Subjective: Feels ok, admits taking large amounts of baclofen, denies SI  Objective: Filed Vitals:   03/24/15 0529 03/24/15 1300  BP: 123/74 136/81  Pulse: 96 100  Temp: 98.4 F (36.9 C) 99.1 F (37.3 C)  Resp: 20     Intake/Output Summary (Last 24 hours) at 03/24/15 1338 Last data filed at 03/24/15 0900  Gross per 24 hour  Intake 4984.75 ml  Output    400 ml  Net 4584.75 ml   There were no vitals filed for this visit.  Exam:   General:  AAOx3, no distress  Cardiovascular: S1S2/RRR  Respiratory: CTAB  Abdomen: soft, NT, BS present  Musculoskeletal: no edema c/c  Data Reviewed: Basic Metabolic Panel:  Recent Labs Lab 03/23/15 0951 03/23/15 1427 03/24/15 0448  NA 149*  --  140  K 3.8  --  3.2*  CL 110  --  103  CO2 24  --  24  GLUCOSE 97  --  97  BUN 6  --  <5*  CREATININE 0.71  --  0.76  CALCIUM 9.1  --  9.0  MG 2.0 2.1 1.9   Liver Function Tests:  Recent Labs Lab 03/23/15 0951 03/24/15 0448  AST 37 26  ALT 49 41  ALKPHOS 103 92  BILITOT 0.8 1.1  PROT 6.7 6.1*  ALBUMIN 3.9 3.4*   No results for input(s): LIPASE, AMYLASE in the last 168 hours. No results for input(s): AMMONIA in the last 168 hours. CBC:  Recent Labs Lab 03/23/15 0951 03/24/15 0448  WBC 9.5 8.3   NEUTROABS 7.3  --   HGB 10.9* 9.7*  HCT 35.3* 31.2*  MCV 99.2 97.2  PLT 223 217   Cardiac Enzymes:  Recent Labs Lab 03/23/15 0951 03/23/15 1529  CKTOTAL  --  166  TROPONINI <0.03  --    BNP (last 3 results) No results for input(s): BNP in the last 8760 hours.  ProBNP (last 3 results) No results for input(s): PROBNP in the last 8760 hours.  CBG: No results for input(s): GLUCAP in the last 168 hours.  No results found for this or any previous visit (from the past 240 hour(s)).   Studies: No results found.  Scheduled Meds: . acetaminophen  650 mg Oral Once  . enoxaparin (LOVENOX) injection  40 mg Subcutaneous Q24H  . folic acid  1 mg Oral Daily  . multivitamin with minerals  1 tablet Oral Daily  . thiamine  100 mg Oral Daily   Or  . thiamine  100 mg Intravenous Daily   Continuous Infusions: . sodium chloride 75 mL/hr at 03/24/15 0215   Antibiotics Given (last 72 hours)    None      Principal Problem:   Drug overdose Active Problems:   Hypothyroidism   Polypharmacy   Bipolar affective (HCC)   Overdose    Time spent:    [facility]  [facility] Pager [phone]. If 7PM-7AM,  please contact night-coverage at [email or web address], password [facility] 03/24/2015, 1:38 PM  LOS: 1 day

## 2015-03-24 NOTE — Care Management Note (Signed)
Case Management Note  Patient Details  Name: Brittany [name] MRN: [id number] Date of Birth: 06-20-1965  Subjective/Objective:                 Spoke with patient at the bedside. She is in OBS with suicide attempt. Sitter at bedside. Patient was DC'd 1-25 to [facility] on LOG for 5 day treatment of IV Abx. After admission here for suicide attempt that she was treated in ICU and intubated for. DC'd from SNF 03-15-15.  CM texted Gina CSW psych to confirm/ deny patient is being followed by psych this stay and to discuss disposition. CSW aware of patient, and verified through bedside RN that patient was seen by psych MD. Patient states that she is not followed by [facility] and does not have a psychiatrist she sees outpatient. CSW will follow with Psychiatrist for dispo plan and psych follow up appointments.  Action/Plan:  Will follow for CM needs.  Expected Discharge Date:                  Expected Discharge Plan:     In-House Referral:     Discharge planning Services  CM Consult  Post Acute Care Choice:    Choice offered to:     DME Arranged:    DME Agency:     HH Arranged:    HH Agency:     Status of Service:  In process, will continue to follow  Medicare Important Message Given:    Date Medicare IM Given:    Medicare IM give by:    Date Additional Medicare IM Given:    Additional Medicare Important Message give by:     If discussed at Long Length of Stay Meetings, dates discussed:    Additional Comments:  Lawerance Sabal, RN 03/24/2015, 1:45 PM

## 2015-03-24 NOTE — Consult Note (Signed)
[facility] Consult   Reason for Consult:  Frequent prescription accidental overdose, cannabis abuse and history of bipolar disorder Referring Physician:  Dr. Broadus John Patient Identification: Brittany [name] MRN:  [id number] Principal Diagnosis: Drug overdose Diagnosis:   Patient Active Problem List   Diagnosis Date Noted  . Overdose [T50.901A] 03/23/2015  . Postextubation stridor [T85.89XA, R06.1]   . History of hepatitis C virus infection [Z86.19] 02/26/2015  . Acute respiratory failure ([place]) [J96.00]   . Altered mental status [R41.82]   . Endotracheally intubated [Z78.9]   . Acute encephalopathy [G93.40] 02/24/2015  . Drug overdose [T50.901A] 02/24/2015  . 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 Right facial pain [R51] 01/05/2015  . Orbit fracture ([place]) [S02.80XA] 11/30/2013  . Polypharmacy [Z79.899] 01/18/2012    Class: Chronic  . Bipolar affective ([place]) [F31.9] 01/18/2012  . Borderline personality disorder [F60.3] 01/18/2012  . Anxiety disorder [F41.9] 01/14/2012  . Pyelonephritis [N12] 09/16/2011  . Hypothyroidism [E03.9] 09/16/2011  . Anxiety [F41.9] 09/16/2011    Total Time spent with patient: 1 hour  Subjective:   Brittany [name] is a 50 y.o. female patient admitted with accidental overdose.  HPI:  Brittany [name] is a 50 years old female who lives with her fianc admitted to [facility] with altered mental status followed by accidental overdose of her prescription medication baclofen, about 38 pounds and required intubation secondary to respiratory failure to. Patient stated that she does not remember overdosing her medication but remember taking first medication which is prescribed to her. Patient was recently admitted for similar clinical situation during the month of January 2017. Patient was sent to skilled nursing facility because she needed 14 day IV antibiotics. Patient reportedly has no problems while getting treatment and then went home  week ago. Patient denies current symptoms of depression, anxiety, mania, auditory/visual hallucinations. Patient also denies active suicidal/homicidal ideation, intention or plans. Patient urine drug screen is positive for benzodiazepines and tetrahydrocannabinol  Past Psychiatric History: Patient has been diagnosed with Bipolar disorder, chronic pain syndrome among other medical issues She was not suicidal. She smokes marijuana. Patient has had multiple behavioral admissions dating back to 2011.   Risk to Self: Is patient at risk for suicide?: Yes Risk to Others:   Prior Inpatient Therapy:   Prior Outpatient Therapy:    Past Medical History:  Past Medical History  Diagnosis Date  . Hypertension   . Bipolar 1 disorder ([place])   . Anxiety   . Bulging lumbar disc   . Ruptured intervertebral disc   . Head injury   . Short-term memory loss   . Depression   . Hypothyroidism   . Goiter   . Facial pain 11/2013    walking dog when she tripped and "faceplanted" landing on her face.   . Facial nerve injury   . Hyperlipidemia   . Heart murmur   . Chronic bronchitis ([place])   . GERD (gastroesophageal reflux disease)   . Migraine     "weekly" (03/23/2015)  . Seizures ([place])     "I've had a few; had a grand mal when I took Neurotin; still take Neurotin" (03/23/2015)  . Chronic lower back pain     Past Surgical History  Procedure Laterality Date  . Tonsillectomy    . Abdominal hysterectomy    . Cesarean section  2004  . Eye surgery Right 11/2013    emergent lateral canthotomy due to proptosis  /notes 11/30/2013   Family History:  Family History  Problem Relation  Age of Onset  . Migraines Mother   . Migraines Father   . Other Father   . Migraines Sister    Family Psychiatric  History: Unknown Social History:  History  Alcohol Use  . Yes    Comment: 03/23/2015 "might drink at Christmastime"     History  Drug Use  . Yes  . Special: Marijuana    Comment: 03/23/2015 ""haven't smoked  marijuana for years"    Social History   Social History  . Marital Status: Married    Spouse Name: N/A  . Number of Children: 1  . Years of Education: 16   Occupational History  . Unemployed    Social History Main Topics  . Smoking status: Former Smoker    Types: Cigarettes  . Smokeless tobacco: Never Used     Comment: "smoked cigarettes when I was 50 years old"  . Alcohol Use: Yes     Comment: 03/23/2015 "might drink at Christmastime"  . Drug Use: Yes    Special: Marijuana     Comment: 03/23/2015 ""haven't smoked marijuana for years"  . Sexual Activity: Yes    Birth Control/ Protection: Condom   Other Topics Concern  . None   Social History Narrative   Lives at home with her fiancee.   Right-handed.   No caffeine use.   Additional Social History: She lives with her fianc    Allergies:   Allergies  Allergen Reactions  . Toradol [Ketorolac Tromethamine] Swelling  . Suboxone [Buprenorphine Hcl-Naloxone Hcl]     cellulitis   . Tegretol [Carbamazepine] Rash  . Trazodone And Nefazodone Other (See Comments)    Bladder infection    Labs:  Results for orders placed or performed during the hospital encounter of 03/23/15 (from the past 48 hour(s))  Urinalysis, Routine w reflex microscopic (not at [facility])     Status: Abnormal   Collection Time: 03/23/15  9:47 AM  Result Value Ref Range   Color, Urine YELLOW YELLOW   APPearance HAZY (A) CLEAR   Specific Gravity, Urine 1.011 1.005 - 1.030   pH 7.0 5.0 - 8.0   Glucose, UA NEGATIVE NEGATIVE mg/dL   Hgb urine dipstick NEGATIVE NEGATIVE   Bilirubin Urine NEGATIVE NEGATIVE   Ketones, ur NEGATIVE NEGATIVE mg/dL   Protein, ur NEGATIVE NEGATIVE mg/dL   Nitrite NEGATIVE NEGATIVE   Leukocytes, UA NEGATIVE NEGATIVE    Comment: MICROSCOPIC NOT DONE ON URINES WITH NEGATIVE PROTEIN, BLOOD, LEUKOCYTES, NITRITE, OR GLUCOSE <1000 mg/dL.  Urine rapid drug screen (hosp performed)     Status: Abnormal   Collection Time: 03/23/15  9:47 AM   Result Value Ref Range   Opiates NONE DETECTED NONE DETECTED   Cocaine NONE DETECTED NONE DETECTED   Benzodiazepines POSITIVE (A) NONE DETECTED   Amphetamines NONE DETECTED NONE DETECTED   Tetrahydrocannabinol POSITIVE (A) NONE DETECTED   Barbiturates NONE DETECTED NONE DETECTED    Comment:        DRUG SCREEN FOR MEDICAL PURPOSES ONLY.  IF CONFIRMATION IS NEEDED FOR ANY PURPOSE, NOTIFY LAB WITHIN 5 DAYS.        LOWEST DETECTABLE LIMITS FOR URINE DRUG SCREEN Drug Class       Cutoff (ng/mL) Amphetamine      1000 Barbiturate      200 Benzodiazepine   902 Tricyclics       409 Opiates          300 Cocaine          300 THC  26   I-Stat arterial blood gas, ED     Status: Abnormal   Collection Time: 03/23/15  9:49 AM  Result Value Ref Range   pH, Arterial 7.403 7.350 - 7.450   pCO2 arterial 39.0 35.0 - 45.0 mmHg   pO2, Arterial 69.0 (L) 80.0 - 100.0 mmHg   Bicarbonate 24.4 (H) 20.0 - 24.0 mEq/L   TCO2 26 0 - 100 mmol/L   O2 Saturation 94.0 %   Patient temperature 98.5 F    Collection site RADIAL, ALLEN'S TEST ACCEPTABLE    Drawn by RT    Sample type ARTERIAL   CBC with Differential     Status: Abnormal   Collection Time: 03/23/15  9:51 AM  Result Value Ref Range   WBC 9.5 4.0 - 10.5 K/uL   RBC 3.56 (L) 3.87 - 5.11 MIL/uL   Hemoglobin 10.9 (L) 12.0 - 15.0 g/dL   HCT 35.3 (L) 36.0 - 46.0 %   MCV 99.2 78.0 - 100.0 fL   MCH 30.6 26.0 - 34.0 pg   MCHC 30.9 30.0 - 36.0 g/dL   RDW 15.1 11.5 - 15.5 %   Platelets 223 150 - 400 K/uL   Neutrophils Relative % 76 %   Neutro Abs 7.3 1.7 - 7.7 K/uL   Lymphocytes Relative 14 %   Lymphs Abs 1.3 0.7 - 4.0 K/uL   Monocytes Relative 7 %   Monocytes Absolute 0.6 0.1 - 1.0 K/uL   Eosinophils Relative 2 %   Eosinophils Absolute 0.2 0.0 - 0.7 K/uL   Basophils Relative 1 %   Basophils Absolute 0.1 0.0 - 0.1 K/uL  Comprehensive metabolic panel     Status: Abnormal   Collection Time: 03/23/15  9:51 AM  Result Value Ref Range    Sodium 149 (H) 135 - 145 mmol/L   Potassium 3.8 3.5 - 5.1 mmol/L   Chloride 110 101 - 111 mmol/L   CO2 24 22 - 32 mmol/L   Glucose, Bld 97 65 - 99 mg/dL   BUN 6 6 - 20 mg/dL   Creatinine, Ser 0.71 0.44 - 1.00 mg/dL   Calcium 9.1 8.9 - 10.3 mg/dL   Total Protein 6.7 6.5 - 8.1 g/dL   Albumin 3.9 3.5 - 5.0 g/dL   AST 37 15 - 41 U/L   ALT 49 14 - 54 U/L   Alkaline Phosphatase 103 38 - 126 U/L   Total Bilirubin 0.8 0.3 - 1.2 mg/dL   GFR calc non Af Amer >60 >60 mL/min   GFR calc Af Amer >60 >60 mL/min    Comment: (NOTE) The eGFR has been calculated using the CKD EPI equation. This calculation has not been validated in all clinical situations. eGFR's persistently <60 mL/min signify possible Chronic Kidney Disease.    Anion gap 15 5 - 15  Acetaminophen level     Status: Abnormal   Collection Time: 03/23/15  9:51 AM  Result Value Ref Range   Acetaminophen (Tylenol), Serum <10 (L) 10 - 30 ug/mL    Comment:        THERAPEUTIC CONCENTRATIONS VARY SIGNIFICANTLY. A RANGE OF 10-30 ug/mL MAY BE AN EFFECTIVE CONCENTRATION FOR MANY PATIENTS. HOWEVER, SOME ARE BEST TREATED AT CONCENTRATIONS OUTSIDE THIS RANGE. ACETAMINOPHEN CONCENTRATIONS >150 ug/mL AT 4 HOURS AFTER INGESTION AND >50 ug/mL AT 12 HOURS AFTER INGESTION ARE OFTEN ASSOCIATED WITH TOXIC REACTIONS.   Salicylate level     Status: None   Collection Time: 03/23/15  9:51 AM  Result Value Ref Range  Salicylate Lvl <7.4 2.8 - 30.0 mg/dL  Magnesium     Status: None   Collection Time: 03/23/15  9:51 AM  Result Value Ref Range   Magnesium 2.0 1.7 - 2.4 mg/dL  Ethanol     Status: None   Collection Time: 03/23/15  9:51 AM  Result Value Ref Range   Alcohol, Ethyl (B) <5 <5 mg/dL    Comment:        LOWEST DETECTABLE LIMIT FOR SERUM ALCOHOL IS 5 mg/dL FOR MEDICAL PURPOSES ONLY   Troponin I     Status: None   Collection Time: 03/23/15  9:51 AM  Result Value Ref Range   Troponin I <0.03 <0.031 ng/mL    Comment:        NO  INDICATION OF MYOCARDIAL INJURY.   I-Stat CG4 Lactic Acid, ED     Status: None   Collection Time: 03/23/15 10:11 AM  Result Value Ref Range   Lactic Acid, Venous 1.53 0.5 - 2.0 mmol/L  Magnesium     Status: None   Collection Time: 03/23/15  2:27 PM  Result Value Ref Range   Magnesium 2.1 1.7 - 2.4 mg/dL  I-Stat CG4 Lactic Acid, ED     Status: None   Collection Time: 03/23/15  2:29 PM  Result Value Ref Range   Lactic Acid, Venous 1.26 0.5 - 2.0 mmol/L  CK     Status: None   Collection Time: 03/23/15  3:29 PM  Result Value Ref Range   Total CK 166 38 - 234 U/L  Comprehensive metabolic panel     Status: Abnormal   Collection Time: 03/24/15  4:48 AM  Result Value Ref Range   Sodium 140 135 - 145 mmol/L    Comment: DELTA CHECK NOTED   Potassium 3.2 (L) 3.5 - 5.1 mmol/L   Chloride 103 101 - 111 mmol/L   CO2 24 22 - 32 mmol/L   Glucose, Bld 97 65 - 99 mg/dL   BUN <5 (L) 6 - 20 mg/dL   Creatinine, Ser 0.76 0.44 - 1.00 mg/dL   Calcium 9.0 8.9 - 10.3 mg/dL   Total Protein 6.1 (L) 6.5 - 8.1 g/dL   Albumin 3.4 (L) 3.5 - 5.0 g/dL   AST 26 15 - 41 U/L   ALT 41 14 - 54 U/L   Alkaline Phosphatase 92 38 - 126 U/L   Total Bilirubin 1.1 0.3 - 1.2 mg/dL   GFR calc non Af Amer >60 >60 mL/min   GFR calc Af Amer >60 >60 mL/min    Comment: (NOTE) The eGFR has been calculated using the CKD EPI equation. This calculation has not been validated in all clinical situations. eGFR's persistently <60 mL/min signify possible Chronic Kidney Disease.    Anion gap 13 5 - 15  CBC     Status: Abnormal   Collection Time: 03/24/15  4:48 AM  Result Value Ref Range   WBC 8.3 4.0 - 10.5 K/uL   RBC 3.21 (L) 3.87 - 5.11 MIL/uL   Hemoglobin 9.7 (L) 12.0 - 15.0 g/dL   HCT 31.2 (L) 36.0 - 46.0 %   MCV 97.2 78.0 - 100.0 fL   MCH 30.2 26.0 - 34.0 pg   MCHC 31.1 30.0 - 36.0 g/dL   RDW 15.2 11.5 - 15.5 %   Platelets 217 150 - 400 K/uL  Magnesium     Status: None   Collection Time: 03/24/15  4:48 AM  Result  Value Ref Range   Magnesium 1.9  1.7 - 2.4 mg/dL    Current Facility-Administered Medications  Medication Dose Route Frequency Provider Last Rate Last Dose  . 0.45 % sodium chloride infusion   Intravenous Continuous Rondel Jumbo, PA-C 75 mL/hr at 03/24/15 0215    . acetaminophen (TYLENOL) tablet 650 mg  650 mg Oral Once Gardiner Barefoot, NP      . enoxaparin (LOVENOX) injection 40 mg  40 mg Subcutaneous Q24H Rondel Jumbo, PA-C   40 mg at 42/70/62 3762  . folic acid (FOLVITE) tablet 1 mg  1 mg Oral Daily Rondel Jumbo, PA-C   1 mg at 03/24/15 8315  . multivitamin with minerals tablet 1 tablet  1 tablet Oral Daily Rondel Jumbo, PA-C   1 tablet at 03/24/15 1761  . thiamine (VITAMIN B-1) tablet 100 mg  100 mg Oral Daily Rondel Jumbo, PA-C   100 mg at 03/24/15 6073   Or  . thiamine (B-1) injection 100 mg  100 mg Intravenous Daily Rondel Jumbo, PA-C        Musculoskeletal: Strength & Muscle Tone: decreased Gait & Station: unable to stand Patient leans: N/A  Psychiatric Specialty Exam: ROS patient complains about chronic pain and denies depression, anxiety. Patient has denied chest pain, shortness of breath, nausea, vomiting and diarrhea No Fever-chills, No Headache, No changes with Vision or hearing, reports vertigo No problems swallowing food or Liquids, No Chest pain, Cough or Shortness of Breath, No Abdominal pain, No Nausea or Vommitting, Bowel movements are regular, No Blood in stool or Urine, No dysuria, No new skin rashes or bruises, No new joints pains-aches,  No new weakness, tingling, numbness in any extremity, No recent weight gain or loss, No polyuria, polydypsia or polyphagia,   A full 10 point Review of Systems was done, except as stated above, all other Review of Systems were negative.  Blood pressure 123/74, pulse 96, temperature 98.4 F (36.9 C), temperature source Oral, resp. rate 20, SpO2 96 %.There is no weight on file to calculate BMI.  General  Appearance: Casual  Eye Contact::  Good  Speech:  Clear and Coherent  Volume:  Normal  Mood:  Anxious and Depressed  Affect:  Appropriate and Congruent  Thought Process:  Coherent and Goal Directed  Orientation:  Full (Time, Place, and Person)  Thought Content:  Rumination  Suicidal Thoughts:  No  Homicidal Thoughts:  No  Memory:  Immediate;   Fair Recent;   Fair  Judgement:  Impaired  Insight:  Fair  Psychomotor Activity:  Decreased  Concentration:  Fair  Recall:  Good  Fund of Knowledge:Good  Language: Good  Akathisia:  Negative  Handed:  Right  AIMS (if indicated):     Assets:  Communication Skills Desire for Improvement Financial Resources/Insurance Housing Intimacy Leisure Time Resilience Social Support Talents/Skills Transportation  ADL's:  Impaired  Cognition: WNL  Sleep:      Treatment Plan Summary: Daily contact with patient to assess and evaluate symptoms and progress in treatment and Medication management  Patient endorses accidental overdose of baclofen and denies active suicidal/homicidal ideation Patient has no safety concerns instead of accidental overdose twice recently  Patient should not take/administer her own medication patient needed supervised medication administration by family member Patient medications should be placed in a locked box when it was not used and key should not be accessible to patient Patient agrees with the above restrictions and also reported her rest of medication beclofen was disposed by her fianc and she  does not have any new scripts with her Recommended no additional psychotropic medication changes at this time   Disposition: Patient will be referred to the outpatient psychiatric services and medication management. Patient does not meet criteria for psychiatric inpatient admission. Supportive therapy provided about ongoing stressors.  Durward Parcel., MD 03/24/2015 11:47 AM

## 2015-03-25 ENCOUNTER — Observation Stay (HOSPITAL_COMMUNITY): Payer: Medicaid Other

## 2015-03-25 DIAGNOSIS — T50901A Poisoning by unspecified drugs, medicaments and biological substances, accidental (unintentional), initial encounter: Secondary | ICD-10-CM | POA: Diagnosis not present

## 2015-03-25 DIAGNOSIS — I10 Essential (primary) hypertension: Secondary | ICD-10-CM | POA: Diagnosis not present

## 2015-03-25 DIAGNOSIS — G894 Chronic pain syndrome: Secondary | ICD-10-CM | POA: Diagnosis not present

## 2015-03-25 DIAGNOSIS — G9340 Encephalopathy, unspecified: Secondary | ICD-10-CM

## 2015-03-25 DIAGNOSIS — G9341 Metabolic encephalopathy: Secondary | ICD-10-CM | POA: Diagnosis not present

## 2015-03-25 DIAGNOSIS — T428X1A Poisoning by antiparkinsonism drugs and other central muscle-tone depressants, accidental (unintentional), initial encounter: Secondary | ICD-10-CM | POA: Diagnosis not present

## 2015-03-25 DIAGNOSIS — Z79899 Other long term (current) drug therapy: Secondary | ICD-10-CM | POA: Diagnosis not present

## 2015-03-25 LAB — CBC
HCT: 33.9 % — ABNORMAL LOW (ref 36.0–46.0)
Hemoglobin: 10.8 g/dL — ABNORMAL LOW (ref 12.0–15.0)
MCH: 30.5 pg (ref 26.0–34.0)
MCHC: 31.9 g/dL (ref 30.0–36.0)
MCV: 95.8 fL (ref 78.0–100.0)
PLATELETS: 225 10*3/uL (ref 150–400)
RBC: 3.54 MIL/uL — ABNORMAL LOW (ref 3.87–5.11)
RDW: 14.9 % (ref 11.5–15.5)
WBC: 7 10*3/uL (ref 4.0–10.5)

## 2015-03-25 LAB — BASIC METABOLIC PANEL
ANION GAP: 11 (ref 5–15)
BUN: 8 mg/dL (ref 6–20)
CALCIUM: 9.1 mg/dL (ref 8.9–10.3)
CO2: 22 mmol/L (ref 22–32)
CREATININE: 0.66 mg/dL (ref 0.44–1.00)
Chloride: 107 mmol/L (ref 101–111)
Glucose, Bld: 115 mg/dL — ABNORMAL HIGH (ref 65–99)
Potassium: 4.4 mmol/L (ref 3.5–5.1)
Sodium: 140 mmol/L (ref 135–145)

## 2015-03-25 LAB — GLUCOSE, CAPILLARY
GLUCOSE-CAPILLARY: 101 mg/dL — AB (ref 65–99)
Glucose-Capillary: 104 mg/dL — ABNORMAL HIGH (ref 65–99)

## 2015-03-25 MED ORDER — LORAZEPAM 2 MG/ML IJ SOLN: 0.5000 mg | Freq: Once | INTRAMUSCULAR | Status: DC

## 2015-03-25 MED ORDER — GABAPENTIN 600 MG PO TABS
600.0000 mg | ORAL_TABLET | Freq: Three times a day (TID) | ORAL | Status: DC
  Administered 2015-03-25 – 2015-03-27 (×5): 600 mg via ORAL
  Filled 2015-03-25 (×5): qty 1

## 2015-03-25 MED ORDER — GABAPENTIN 300 MG PO CAPS
300.0000 mg | ORAL_CAPSULE | Freq: Once | ORAL | Status: AC
  Administered 2015-03-25: 300 mg via ORAL
  Filled 2015-03-25: qty 1

## 2015-03-25 MED ORDER — MAGNESIUM SULFATE 2 GM/50ML IV SOLN
2.0000 g | Freq: Once | INTRAVENOUS | Status: AC
Start: 2015-03-25 — End: 2015-03-25
  Administered 2015-03-25: 2 g via INTRAVENOUS
  Filled 2015-03-25: qty 50

## 2015-03-25 MED ORDER — LORAZEPAM 2 MG/ML IJ SOLN
0.5000 mg | Freq: Once | INTRAMUSCULAR | Status: AC
  Administered 2015-03-25: 0.5 mg via INTRAMUSCULAR

## 2015-03-25 MED ORDER — GABAPENTIN 400 MG PO CAPS: 400.0000 mg | ORAL_CAPSULE | Freq: Three times a day (TID) | ORAL | Status: DC

## 2015-03-25 MED ORDER — LORAZEPAM 2 MG/ML IJ SOLN
INTRAMUSCULAR | Status: AC
  Administered 2015-03-25: 19:00:00
  Filled 2015-03-25: qty 1

## 2015-03-25 NOTE — Progress Notes (Signed)
[facility] PROGRESS NOTE  Brittany [name] WUJ:[id number] DOB: 1965/05/05 DOA: 03/23/2015 PCP: Evelene [name], MD  Assessment/Plan: 1. Metabolic encephalopathy -due to drug OD -resolved -denies SI, Psych following, recent admission with same -Inpatient Psych not recommended at this time per Psych -QTC still prolonged, caution with meds, repeat this pm -replace K, mag is ok  2. Chronic pain?Neuropathy -on Baclofen/nortriptyline/Gabpentin -baclofen stopped, resumed Gabapentin at lower dose  3. Bipolar d/o, anxiety/Depression -denies SI -Psych FU as outpatient recommended  ADDENDUM: 4.ACute MS changes after 5:30pm this evening -per Witness, spoke to fiance and was upset that he wasn't on his way, then reportedly held her breath -almost fell backwards, no loss of consciousness per Sitter, was helped to floor and confused thereafter -Mentation improving, CBG-101, EKG with worsening of QTC, unfortunately tele was just removed due to DC plans -unclear if could have had an arrhythmia or seizure, EKG with NSR, prolonged QTC, give Mag, K 4.4 this am -check CT head and EEG, give extra dose of Gabapentin and low dose ativan to help with anxiety/panic now -monitor Neuro status closely  DVT proph: lovenox  Code Status: FUll COde Family Communication: none at bedside Disposition Plan: based on psych eval   Consultants:  Pstchiatry  HPI/Subjective: Feels ok, admits taking large amounts of baclofen, denies SI  Objective: Filed Vitals:   03/25/15 0500 03/25/15 1509  BP: 120/82 156/99  Pulse: 96 99  Temp: 98.2 F (36.8 C) 98.4 F (36.9 C)  Resp: 18 20    Intake/Output Summary (Last 24 hours) at 03/25/15 1751 Last data filed at 03/25/15 0900  Gross per 24 hour  Intake    702 ml  Output      0 ml  Net    702 ml   Filed Weights   03/24/15 1900  Weight: 102 kg (224 lb 13.9 oz)    Exam:   General:  AAOx3, no distress  Cardiovascular: S1S2/RRR  Respiratory:  CTAB  Abdomen: soft, NT, BS present  Musculoskeletal: no edema c/c  Data Reviewed: Basic Metabolic Panel:  Recent Labs Lab 03/23/15 0951 03/23/15 1427 03/24/15 0448 03/25/15 0701  NA 149*  --  140 140  K 3.8  --  3.2* 4.4  CL 110  --  103 107  CO2 24  --  24 22  GLUCOSE 97  --  97 115*  BUN 6  --  <5* 8  CREATININE 0.71  --  0.76 0.66  CALCIUM 9.1  --  9.0 9.1  MG 2.0 2.1 1.9  --    Liver Function Tests:  Recent Labs Lab 03/23/15 0951 03/24/15 0448  AST 37 26  ALT 49 41  ALKPHOS 103 92  BILITOT 0.8 1.1  PROT 6.7 6.1*  ALBUMIN 3.9 3.4*   No results for input(s): LIPASE, AMYLASE in the last 168 hours. No results for input(s): AMMONIA in the last 168 hours. CBC:  Recent Labs Lab 03/23/15 0951 03/24/15 0448 03/25/15 0701  WBC 9.5 8.3 7.0  NEUTROABS 7.3  --   --   HGB 10.9* 9.7* 10.8*  HCT 35.3* 31.2* 33.9*  MCV 99.2 97.2 95.8  PLT 223 217 225   Cardiac Enzymes:  Recent Labs Lab 03/23/15 0951 03/23/15 1529  CKTOTAL  --  166  TROPONINI <0.03  --    BNP (last 3 results) No results for input(s): BNP in the last 8760 hours.  ProBNP (last 3 results) No results for input(s): PROBNP in the last 8760 hours.  CBG:  Recent Labs  Lab 03/25/15 0742  GLUCAP 104*    No results found for this or any previous visit (from the past 240 hour(s)).   Studies: No results found.  Scheduled Meds: . enoxaparin (LOVENOX) injection  40 mg Subcutaneous Q24H  . folic acid  1 mg Oral Daily  . gabapentin  300 mg Oral Once  . gabapentin  600 mg Oral TID  . LORazepam      . LORazepam  0.5 mg Intramuscular Once  . magnesium sulfate 1 - 4 g bolus IVPB  2 g Intravenous Once  . multivitamin with minerals  1 tablet Oral Daily  . thiamine  100 mg Oral Daily   Or  . thiamine  100 mg Intravenous Daily   Continuous Infusions:   Antibiotics Given (last 72 hours)    None      Principal Problem:   Drug overdose Active Problems:   Hypothyroidism   Polypharmacy    Bipolar affective ([facility])   Overdose    Time spent:    [facility]  [facility] Pager [phone]. If 7PM-7AM, please contact night-coverage at [email or web address], password [facility] 03/25/2015, 5:51 PM  LOS: 2 days

## 2015-03-25 NOTE — Progress Notes (Signed)
Requested CSW to contact attending to confirm DC follow up per attending's request so DC can be planned for today.

## 2015-03-26 ENCOUNTER — Observation Stay (HOSPITAL_COMMUNITY): Payer: Medicaid Other

## 2015-03-26 DIAGNOSIS — G9341 Metabolic encephalopathy: Secondary | ICD-10-CM | POA: Diagnosis not present

## 2015-03-26 DIAGNOSIS — T50901A Poisoning by unspecified drugs, medicaments and biological substances, accidental (unintentional), initial encounter: Secondary | ICD-10-CM | POA: Diagnosis not present

## 2015-03-26 DIAGNOSIS — T428X1A Poisoning by antiparkinsonism drugs and other central muscle-tone depressants, accidental (unintentional), initial encounter: Secondary | ICD-10-CM | POA: Diagnosis not present

## 2015-03-26 DIAGNOSIS — E039 Hypothyroidism, unspecified: Secondary | ICD-10-CM

## 2015-03-26 DIAGNOSIS — T50901D Poisoning by unspecified drugs, medicaments and biological substances, accidental (unintentional), subsequent encounter: Secondary | ICD-10-CM | POA: Diagnosis not present

## 2015-03-26 DIAGNOSIS — Z79899 Other long term (current) drug therapy: Secondary | ICD-10-CM | POA: Diagnosis not present

## 2015-03-26 DIAGNOSIS — G894 Chronic pain syndrome: Secondary | ICD-10-CM | POA: Diagnosis not present

## 2015-03-26 DIAGNOSIS — F3130 Bipolar disorder, current episode depressed, mild or moderate severity, unspecified: Secondary | ICD-10-CM

## 2015-03-26 DIAGNOSIS — I10 Essential (primary) hypertension: Secondary | ICD-10-CM | POA: Diagnosis not present

## 2015-03-26 LAB — BASIC METABOLIC PANEL
ANION GAP: 11 (ref 5–15)
BUN: 7 mg/dL (ref 6–20)
CALCIUM: 9.2 mg/dL (ref 8.9–10.3)
CO2: 23 mmol/L (ref 22–32)
CREATININE: 0.63 mg/dL (ref 0.44–1.00)
Chloride: 106 mmol/L (ref 101–111)
GLUCOSE: 86 mg/dL (ref 65–99)
POTASSIUM: 4.2 mmol/L (ref 3.5–5.1)
SODIUM: 140 mmol/L (ref 135–145)

## 2015-03-26 LAB — CBC
HEMATOCRIT: 40.1 % (ref 36.0–46.0)
Hemoglobin: 13.1 g/dL (ref 12.0–15.0)
MCH: 31.6 pg (ref 26.0–34.0)
MCHC: 32.7 g/dL (ref 30.0–36.0)
MCV: 96.9 fL (ref 78.0–100.0)
Platelets: 237 10*3/uL (ref 150–400)
RBC: 4.14 MIL/uL (ref 3.87–5.11)
RDW: 14.9 % (ref 11.5–15.5)
WBC: 6.9 10*3/uL (ref 4.0–10.5)

## 2015-03-26 LAB — GLUCOSE, CAPILLARY: GLUCOSE-CAPILLARY: 79 mg/dL (ref 65–99)

## 2015-03-26 MED ORDER — ALPRAZOLAM 0.5 MG PO TABS
0.5000 mg | ORAL_TABLET | Freq: Two times a day (BID) | ORAL | Status: AC | PRN
  Administered 2015-03-26 (×2): 0.5 mg via ORAL
  Filled 2015-03-26 (×2): qty 1

## 2015-03-26 NOTE — Progress Notes (Signed)
EEG completed; results pending.    

## 2015-03-26 NOTE — Progress Notes (Signed)
Pt slept all shift, sitter at bedside, safety maintained

## 2015-03-26 NOTE — Progress Notes (Signed)
[facility] PROGRESS NOTE  Brittany [name] ZOX:[id number] DOB: 1965-02-18 DOA: 03/23/2015 PCP: Evelene [name], MD  Assessment/Plan: 1. Metabolic encephalopathy -due to drug OD on Baclofen -resolved -denies SI, Psych consulted, recent admission with same -Inpatient Psych not recommended at this time per Psych -QTC was prolonged, Given extra Mag -replaced K, mag is ok -QTC normalized now  2. Chronic pain?Neuropathy -on Baclofen/nortriptyline/Gabpentin -baclofen stopped, resumed Gabapentin at lower dose, dose increased not up to home dose yet  3. Bipolar d/o, anxiety/Depression -denies SI -Psych FU as outpatient recommended  4.ACute MS changes at 5:30pm 2/9 -per Witness, spoke to fiance and was upset that he wasn't on his way, then reportedly held her breath then almost fell backwards, no loss of consciousness per Sitter, was helped to floor and confused thereafter -Mentation improved back to baseline, CBG-101, EKG with worsening of QTC, no events on tele therafter, given extra Mag -unclear if could have had an arrhythmia or seizure, just a panic attack -CT head and EEG and EEG unremarkable, Gabapentin dose increased to  TID,  -given low dose xanax to help with anxiety  DVT proph: lovenox  Code Status: FUll COde Family Communication: none at bedside Disposition Plan: Home tomorrow if stable   Consultants:  Pstchiatry  HPI/Subjective: Feels ok, admits taking large amounts of baclofen, denies SI  Objective: Filed Vitals:   03/25/15 2139 03/26/15 0523  BP: 130/83 138/90  Pulse: 98 85  Temp: 98.5 F (36.9 C) 98 F (36.7 C)  Resp: 20 20    Intake/Output Summary (Last 24 hours) at 03/26/15 1435 Last data filed at 03/25/15 2052  Gross per 24 hour  Intake     50 ml  Output      0 ml  Net     50 ml   Filed Weights   03/24/15 1900  Weight: 102 kg (224 lb 13.9 oz)    Exam:   General:  AAOx3, no distress  Cardiovascular: S1S2/RRR  Respiratory:  CTAB  Abdomen: soft, NT, BS present  Musculoskeletal: no edema c/c  Data Reviewed: Basic Metabolic Panel:  Recent Labs Lab 03/23/15 0951 03/23/15 1427 03/24/15 0448 03/25/15 0701 03/26/15 1001  NA 149*  --  140 140 140  K 3.8  --  3.2* 4.4 4.2  CL 110  --  103 107 106  CO2 24  --  GLUCOSE 97  --  97 115* 86  BUN 6  --  <5* 8 7  CREATININE 0.71  --  0.76 0.66 0.63  CALCIUM 9.1  --  9.0 9.1 9.2  MG 2.0 2.1 1.9  --   --    Liver Function Tests:  Recent Labs Lab 03/23/15 0951 03/24/15 0448  AST 37 26  ALT 49 41  ALKPHOS 103 92  BILITOT 0.8 1.1  PROT 6.7 6.1*  ALBUMIN 3.9 3.4*   No results for input(s): LIPASE, AMYLASE in the last 168 hours. No results for input(s): AMMONIA in the last 168 hours. CBC:  Recent Labs Lab 03/23/15 0951 03/24/15 0448 03/25/15 0701 03/26/15 1001  WBC 9.5 8.3 7.0 6.9  NEUTROABS 7.3  --   --   --   HGB 10.9* 9.7* 10.8* 13.1  HCT 35.3* 31.2* 33.9* 40.1  MCV 99.2 97.2 95.8 96.9  PLT 223 217 225 237   Cardiac Enzymes:  Recent Labs Lab 03/23/15 0951 03/23/15 1529  CKTOTAL  --  166  TROPONINI <0.03  --    BNP (last 3 results) No results  for input(s): BNP in the last 8760 hours.  ProBNP (last 3 results) No results for input(s): PROBNP in the last 8760 hours.  CBG:  Recent Labs Lab 03/25/15 0742 03/25/15 1743 03/26/15 0806  GLUCAP 104* 101* 79    No results found for this or any previous visit (from the past 240 hour(s)).   Studies: Ct Head Wo Contrast  03/25/2015  CLINICAL DATA:  50 year old female with confusion and seizure like and EXAM: CT HEAD WITHOUT CONTRAST TECHNIQUE: Contiguous axial images were obtained from the base of the skull through the vertex without intravenous contrast. COMPARISON:  Brain MRI dated 02/27/2015 FINDINGS: The ventricles and the sulci are appropriate in size for the patient's age. There is no intracranial hemorrhage. No midline shift or mass effect identified. The gray-white  matter differentiation is preserved. There is moderate mucoperiosteal thickening of the right maxillary sinus. There is chronic appearing fracture of the anterior wall of the right maxillary sinus. The remainder of the paranasal sinuses mastoid air cells are well aerated.The calvarium is intact. IMPRESSION: No acute intracranial pathology. Chronic appearing fracture of the anterior wall of the right maxillary sinus. Moderate right maxillary sinus mucoperiosteal thickening. Electronically Signed   By: Elgie Collard M.D.   On: 03/25/2015 19:38    Scheduled Meds: . enoxaparin (LOVENOX) injection  40 mg Subcutaneous Q24H  . folic acid  1 mg Oral Daily  . gabapentin  600 mg Oral TID  . multivitamin with minerals  1 tablet Oral Daily  . thiamine  100 mg Oral Daily   Or  . thiamine  100 mg Intravenous Daily   Continuous Infusions:   Antibiotics Given (last 72 hours)    None      Principal Problem:   Drug overdose Active Problems:   Hypothyroidism   Polypharmacy   Bipolar affective (HCC)   Overdose    Time spent:    [facility]  [facility] Pager [phone]. If 7PM-7AM, please contact night-coverage at [email or web address], password [facility] 03/26/2015, 2:35 PM  LOS: 3 days

## 2015-03-26 NOTE — Procedures (Signed)
ELECTROENCEPHALOGRAM REPORT  Date of Study: 03/26/2015  Patient's Name: Brittany [name] MRN: [id number] Date of Birth: 08-13-1965   Indication: 50 y.o. female Recently hospitalized from 03/07/2015 through 03/10/2015 due to altered mental status due to intentional drug overdose with subsequent acute hypoxic and height current cardiac respiratory failure requiring intubation, Bipolar disorder, chronic pain syndrome among other medical issues listed below, brought by EMS today after fiance found her unresponsive and with altered mental status.  Medications: Gabapentin   Technical Summary: This is a multichannel digital EEG recording, using the international 10-20 placement system with electrodes applied with paste and impedances below 5000 ohms.    Description: The EEG background is symmetric, with a well-developed posterior dominant rhythm of 9 Hz, which is reactive to eye opening and closing.  Diffuse beta activity is seen, with a bilateral frontal preponderance.  No focal or generalized abnormalities are seen.  No focal or generalized epileptiform discharges are seen.  Stage II sleep is not seen.  Hyperventilation and photic stimulation were performed, and produced no abnormalities.  ECG revealed normal cardiac rate and rhythm.  Impression: This is a normal routine EEG of the awake and drowsy states, with activating procedures.  A normal study does not rule out the possibility of a seizure disorder in this patient.  Adam R. Everlena Cooper, DO

## 2015-03-27 DIAGNOSIS — T50901A Poisoning by unspecified drugs, medicaments and biological substances, accidental (unintentional), initial encounter: Secondary | ICD-10-CM | POA: Diagnosis not present

## 2015-03-27 DIAGNOSIS — G9340 Encephalopathy, unspecified: Secondary | ICD-10-CM | POA: Diagnosis not present

## 2015-03-27 DIAGNOSIS — T50901D Poisoning by unspecified drugs, medicaments and biological substances, accidental (unintentional), subsequent encounter: Secondary | ICD-10-CM | POA: Diagnosis not present

## 2015-03-27 DIAGNOSIS — F3130 Bipolar disorder, current episode depressed, mild or moderate severity, unspecified: Secondary | ICD-10-CM | POA: Diagnosis not present

## 2015-03-27 LAB — GLUCOSE, CAPILLARY
GLUCOSE-CAPILLARY: 89 mg/dL (ref 65–99)
GLUCOSE-CAPILLARY: 74 mg/dL (ref 65–99)

## 2015-03-27 MED ORDER — GABAPENTIN 300 MG PO CAPS: 600.0000 mg | ORAL_CAPSULE | Freq: Three times a day (TID) | ORAL | Status: DC

## 2015-03-27 NOTE — Progress Notes (Signed)
Nsg Discharge Note  Admit Date:  03/23/2015 Discharge date: 03/27/2015   Stanton Kidney to be D/C'd Home per MD order.  AVS completed.  Copy for chart, and copy for patient signed, and dated. Patient/caregiver able to verbalize understanding.  Discharge Medication:   Medication List    STOP taking these medications        cyclobenzaprine 10 MG tablet  Commonly known as:  FLEXERIL     vancomycin 1 GM/200ML Soln  Commonly known as:  VANCOCIN      TAKE these medications        cyanocobalamin 1000 MCG tablet  Take 1,000 mcg by mouth daily.     DULoxetine 60 MG capsule  Commonly known as:  CYMBALTA  Take 60 mg by mouth daily.     gabapentin 300 MG capsule  Commonly known as:  NEURONTIN  Take 2 capsules (600 mg total) by mouth 3 (three) times daily.     levothyroxine 50 MCG tablet  Commonly known as:  SYNTHROID, LEVOTHROID  Take 50 mcg by mouth daily.     multivitamin with minerals Tabs tablet  Take 1 tablet by mouth every other day.     nortriptyline 25 MG capsule  Commonly known as:  PAMELOR  Take 1 capsule (25 mg total) by mouth 2 (two) times daily.     pantoprazole 40 MG tablet  Commonly known as:  PROTONIX  Take 1 tablet (40 mg total) by mouth 2 (two) times daily.     pravastatin 40 MG tablet  Commonly known as:  PRAVACHOL  Take 40 mg by mouth daily.     VITAMIN D PO  Take 1 tablet by mouth every other day.        Discharge Assessment: Filed Vitals:   03/26/15 2244 03/27/15 0553  BP: 119/83 104/65  Pulse: 88 87  Temp:  98.1 F (36.7 C)  Resp: 19 18   Skin clean, dry and intact without evidence of skin break down, no evidence of skin tears noted. IV catheter discontinued intact. Site without signs and symptoms of complications - no redness or edema noted at insertion site, patient denies c/o pain - only slight tenderness at site.  Dressing with slight pressure applied.  D/c Instructions-Education: Discharge instructions given to patient/family with  verbalized understanding. D/c education completed with patient/family including follow up instructions, medication list, d/c activities limitations if indicated, with other d/c instructions as indicated by MD - patient able to verbalize understanding, all questions fully answered. Patient instructed to return to ED, call 911, or call MD for any changes in condition.  Patient escorted via WC,will take bus home provided pass from Child psychotherapist.   Kern Reap, RN 03/27/2015 12:54 PM

## 2015-03-27 NOTE — Progress Notes (Signed)
CSW notified that patient is being d/c'd home today and does not have a ride or clothes. She has been unable to reach her boyfriend and nursing indicated they were unsuccessful as well.  Patient stated she does not live far from the bus stop and agreed to bus pass as she does not have any money to pay for transportation. CSW was able to obtain shoes, pants and shirt for patient to wear home as she stated that her clothes became "soiled" when she entered the hospital.  Nursing notified of above. No further CSW needs identified. CSW signing off.  Lorri Frederick. Jaci Lazier, [place] [phone]  (weekend coverage)

## 2015-04-07 NOTE — Discharge Summary (Addendum)
Physician Discharge Summary  Brittany [name] UEA:[id number] DOB: 09-09-1965 DOA: 03/23/2015  PCP: Evelene [name], MD  Admit date: 03/23/2015 Discharge date: 03/27/2015  Time spent: 45 minutes  Recommendations for Outpatient Follow-up:  1. PCP Dr.[name] in 1 week 2. [employer], Psychiatry in 1 week   Discharge Diagnoses:  Principal Problem:   Drug overdose Active Problems:   Hypothyroidism   Polypharmacy   Bipolar affective (HCC)   Overdose   Discharge Condition: stable  Diet recommendation: heart healthy  Filed Weights   03/24/15 1900  Weight: 102 kg (224 lb 13.9 oz)    History of present illness:  Chief Complaint: Acute Mental status changes due to OD HPI: Brittany [name] is a 50 y.o. female Recently hospitalized from 03/07/2015 through 03/10/2015 due to altered mental status due to intentional drug overdose with subsequent acute hypoxic respiratory failure requiring intubation, Bipolar disorder, chronic pain syndrome was brought by EMS today after fiance found her unresponsive. Per chart report, she went to sleep around 6:30 PM on 2/7 after taking "about 38 of baclofen" and was surrounded by a bottle of HCTZ, gabapentin and nortriptyline of unknown dose. She was not suicidal. When found, she was incoherent, combative and stool incontinent.  Hospital Course:  1. Metabolic encephalopathy -due to drug OD on Baclofen, denied intentional OD and Suicidal ideation or attempt -resolved -Psych consulted seen by Dr.Jonalaggada, she had a recent admission with same, Inpatient Psych not recommended at this time per Psych, she was advised to FU with her outpatient Psychiatrist, called and advised fiance to help manage and dispense her medications -Initially QTC was prolonged, Given extra Mag, replaced K, -QTC normalized now -CT head and EEG were unremarkable  2. Chronic pain?Neuropathy -on Baclofen/nortriptyline/Gabpentin -baclofen stopped, resumed Gabapentin at lower dose,  dose increased not up to home dose yet  3. Bipolar d/o, anxiety/Depression -denies SI -Psych FU as outpatient recommended   Consultations:  Psychiatry  Discharge Exam: Filed Vitals:   03/26/15 2244 03/27/15 0553  BP: 119/83 104/65  Pulse: 88 87  Temp:  98.1 F (36.7 C)  Resp: 19 18    General: AAOx3 Cardiovascular:S1S2/RRR Respiratory:CTAB  Discharge Instructions   Discharge Instructions    Diet - low sodium heart healthy    Complete by:  As directed      Increase activity slowly    Complete by:  As directed           Discharge Medication List as of 03/27/2015 12:25 PM    CONTINUE these medications which have CHANGED   Details  gabapentin (NEURONTIN) 300 MG capsule Take 2 capsules (600 mg total) by mouth 3 (three) times daily., Starting 03/27/2015, Until Discontinued, No Print      CONTINUE these medications which have NOT CHANGED   Details  Cholecalciferol (VITAMIN D PO) Take 1 tablet by mouth every other day., Until Discontinued, Historical Med    cyanocobalamin 1000 MCG tablet Take 1,000 mcg by mouth daily., Until Discontinued, Historical Med    DULoxetine (CYMBALTA) 60 MG capsule Take 60 mg by mouth daily., Until Discontinued, Historical Med    levothyroxine (SYNTHROID, LEVOTHROID) 50 MCG tablet Take 50 mcg by mouth daily., Until Discontinued, Historical Med    Multiple Vitamin (MULTIVITAMIN WITH MINERALS) TABS tablet Take 1 tablet by mouth every other day., Until Discontinued, Historical Med    nortriptyline (PAMELOR) 25 MG capsule Take 1 capsule (25 mg total) by mouth 2 (two) times daily., Starting 01/05/2015, Until Discontinued, Normal    pantoprazole (PROTONIX) 40 MG tablet  Take 1 tablet (40 mg total) by mouth 2 (two) times daily., Starting 03/10/2015, Until Discontinued, No Print    pravastatin (PRAVACHOL) 40 MG tablet Take 40 mg by mouth daily., Until Discontinued, Historical Med      STOP taking these medications     cyclobenzaprine (FLEXERIL)  10 MG tablet      vancomycin (VANCOCIN) 1 GM/200ML SOLN        Allergies  Allergen Reactions  . Toradol [Ketorolac Tromethamine] Swelling  . Suboxone [Buprenorphine Hcl-Naloxone Hcl]     cellulitis   . Tegretol [Carbamazepine] Rash  . Trazodone And Nefazodone Other (See Comments)    Bladder infection   Follow-up Information    Follow up with Evelene [name], MD. Schedule an appointment as soon as possible for a visit in 1 week.   Specialty:  Family Medicine   Contact information:   [address] [place] [address] [phone]       Follow up with [employer] (Psychiatry). Schedule an appointment as soon as possible for a visit in 1 week.       The results of significant diagnostics from this hospitalization (including imaging, microbiology, ancillary and laboratory) are listed below for reference.    Significant Diagnostic Studies: Ct Head Wo Contrast  03/25/2015  CLINICAL DATA:  50 year old female with confusion and seizure like and EXAM: CT HEAD WITHOUT CONTRAST TECHNIQUE: Contiguous axial images were obtained from the base of the skull through the vertex without intravenous contrast. COMPARISON:  Brain MRI dated 02/27/2015 FINDINGS: The ventricles and the sulci are appropriate in size for the patient's age. There is no intracranial hemorrhage. No midline shift or mass effect identified. The gray-white matter differentiation is preserved. There is moderate mucoperiosteal thickening of the right maxillary sinus. There is chronic appearing fracture of the anterior wall of the right maxillary sinus. The remainder of the paranasal sinuses mastoid air cells are well aerated.The calvarium is intact. IMPRESSION: No acute intracranial pathology. Chronic appearing fracture of the anterior wall of the right maxillary sinus. Moderate right maxillary sinus mucoperiosteal thickening. Electronically Signed   By: Elgie Collard M.D.   On: 03/25/2015 19:38    Microbiology: No results  found for this or any previous visit (from the past 240 hour(s)).   Labs: Basic Metabolic Panel: No results for input(s): NA, K, CL, CO2, GLUCOSE, BUN, CREATININE, CALCIUM, MG, PHOS in the last 168 hours. Liver Function Tests: No results for input(s): AST, ALT, ALKPHOS, BILITOT, PROT, ALBUMIN in the last 168 hours. No results for input(s): LIPASE, AMYLASE in the last 168 hours. No results for input(s): AMMONIA in the last 168 hours. CBC: No results for input(s): WBC, NEUTROABS, HGB, HCT, MCV, PLT in the last 168 hours. Cardiac Enzymes: No results for input(s): CKTOTAL, CKMB, CKMBINDEX, TROPONINI in the last 168 hours. BNP: BNP (last 3 results) No results for input(s): BNP in the last 8760 hours.  ProBNP (last 3 results) No results for input(s): PROBNP in the last 8760 hours.  CBG: No results for input(s): GLUCAP in the last 168 hours.     SignedZannie Cove MD.  [facility] 04/07/2015, 2:56 PM

## 2015-04-20 ENCOUNTER — Inpatient Hospital Stay (HOSPITAL_COMMUNITY): Payer: Medicaid Other

## 2015-04-20 ENCOUNTER — Encounter (HOSPITAL_COMMUNITY): Payer: Self-pay | Admitting: Emergency Medicine

## 2015-04-20 ENCOUNTER — Inpatient Hospital Stay (HOSPITAL_COMMUNITY)
Admission: EM | Admit: 2015-04-20 | Discharge: 2015-04-27 | DRG: 917 | Disposition: A | Discharge status: Other Acute Inpatient Hospital | Payer: Medicaid Other | Attending: Internal Medicine | Admitting: Internal Medicine

## 2015-04-20 DIAGNOSIS — F315 Bipolar disorder, current episode depressed, severe, with psychotic features: Secondary | ICD-10-CM | POA: Diagnosis not present

## 2015-04-20 DIAGNOSIS — N179 Acute kidney failure, unspecified: Secondary | ICD-10-CM | POA: Diagnosis present

## 2015-04-20 DIAGNOSIS — E039 Hypothyroidism, unspecified: Secondary | ICD-10-CM | POA: Diagnosis present

## 2015-04-20 DIAGNOSIS — D649 Anemia, unspecified: Secondary | ICD-10-CM | POA: Diagnosis present

## 2015-04-20 DIAGNOSIS — Z87891 Personal history of nicotine dependence: Secondary | ICD-10-CM

## 2015-04-20 DIAGNOSIS — J811 Chronic pulmonary edema: Secondary | ICD-10-CM | POA: Diagnosis present

## 2015-04-20 DIAGNOSIS — R300 Dysuria: Secondary | ICD-10-CM | POA: Diagnosis not present

## 2015-04-20 DIAGNOSIS — F23 Brief psychotic disorder: Secondary | ICD-10-CM | POA: Diagnosis present

## 2015-04-20 DIAGNOSIS — R499 Unspecified voice and resonance disorder: Secondary | ICD-10-CM | POA: Diagnosis not present

## 2015-04-20 DIAGNOSIS — G9340 Encephalopathy, unspecified: Secondary | ICD-10-CM | POA: Diagnosis not present

## 2015-04-20 DIAGNOSIS — Z886 Allergy status to analgesic agent status: Secondary | ICD-10-CM

## 2015-04-20 DIAGNOSIS — I10 Essential (primary) hypertension: Secondary | ICD-10-CM | POA: Diagnosis present

## 2015-04-20 DIAGNOSIS — F419 Anxiety disorder, unspecified: Secondary | ICD-10-CM | POA: Diagnosis present

## 2015-04-20 DIAGNOSIS — G43009 Migraine without aura, not intractable, without status migrainosus: Secondary | ICD-10-CM | POA: Diagnosis present

## 2015-04-20 DIAGNOSIS — Z888 Allergy status to other drugs, medicaments and biological substances status: Secondary | ICD-10-CM | POA: Diagnosis not present

## 2015-04-20 DIAGNOSIS — T428X2A Poisoning by antiparkinsonism drugs and other central muscle-tone depressants, intentional self-harm, initial encounter: Secondary | ICD-10-CM | POA: Diagnosis not present

## 2015-04-20 DIAGNOSIS — M545 Low back pain: Secondary | ICD-10-CM | POA: Diagnosis present

## 2015-04-20 DIAGNOSIS — J9601 Acute respiratory failure with hypoxia: Secondary | ICD-10-CM

## 2015-04-20 DIAGNOSIS — T50901A Poisoning by unspecified drugs, medicaments and biological substances, accidental (unintentional), initial encounter: Secondary | ICD-10-CM | POA: Diagnosis not present

## 2015-04-20 DIAGNOSIS — F3160 Bipolar disorder, current episode mixed, unspecified: Secondary | ICD-10-CM | POA: Diagnosis not present

## 2015-04-20 DIAGNOSIS — T1491 Suicide attempt: Secondary | ICD-10-CM | POA: Diagnosis not present

## 2015-04-20 DIAGNOSIS — T43012A Poisoning by tricyclic antidepressants, intentional self-harm, initial encounter: Secondary | ICD-10-CM | POA: Diagnosis present

## 2015-04-20 DIAGNOSIS — G8929 Other chronic pain: Secondary | ICD-10-CM | POA: Diagnosis present

## 2015-04-20 DIAGNOSIS — R413 Other amnesia: Secondary | ICD-10-CM | POA: Diagnosis present

## 2015-04-20 DIAGNOSIS — K219 Gastro-esophageal reflux disease without esophagitis: Secondary | ICD-10-CM | POA: Diagnosis present

## 2015-04-20 DIAGNOSIS — E162 Hypoglycemia, unspecified: Secondary | ICD-10-CM | POA: Diagnosis not present

## 2015-04-20 DIAGNOSIS — Z90710 Acquired absence of both cervix and uterus: Secondary | ICD-10-CM

## 2015-04-20 DIAGNOSIS — Z6841 Body Mass Index (BMI) 40.0 and over, adult: Secondary | ICD-10-CM

## 2015-04-20 DIAGNOSIS — J80 Acute respiratory distress syndrome: Secondary | ICD-10-CM | POA: Diagnosis not present

## 2015-04-20 DIAGNOSIS — J42 Unspecified chronic bronchitis: Secondary | ICD-10-CM | POA: Diagnosis present

## 2015-04-20 DIAGNOSIS — F3181 Bipolar II disorder: Secondary | ICD-10-CM | POA: Diagnosis not present

## 2015-04-20 DIAGNOSIS — Z79899 Other long term (current) drug therapy: Secondary | ICD-10-CM | POA: Diagnosis not present

## 2015-04-20 DIAGNOSIS — E785 Hyperlipidemia, unspecified: Secondary | ICD-10-CM | POA: Diagnosis present

## 2015-04-20 DIAGNOSIS — I071 Rheumatic tricuspid insufficiency: Secondary | ICD-10-CM | POA: Diagnosis present

## 2015-04-20 DIAGNOSIS — F319 Bipolar disorder, unspecified: Secondary | ICD-10-CM | POA: Diagnosis present

## 2015-04-20 DIAGNOSIS — R072 Precordial pain: Secondary | ICD-10-CM | POA: Diagnosis not present

## 2015-04-20 DIAGNOSIS — E876 Hypokalemia: Secondary | ICD-10-CM | POA: Diagnosis present

## 2015-04-20 DIAGNOSIS — T50904A Poisoning by unspecified drugs, medicaments and biological substances, undetermined, initial encounter: Secondary | ICD-10-CM | POA: Diagnosis not present

## 2015-04-20 DIAGNOSIS — R011 Cardiac murmur, unspecified: Secondary | ICD-10-CM | POA: Diagnosis present

## 2015-04-20 DIAGNOSIS — I959 Hypotension, unspecified: Secondary | ICD-10-CM | POA: Diagnosis not present

## 2015-04-20 DIAGNOSIS — T50902D Poisoning by unspecified drugs, medicaments and biological substances, intentional self-harm, subsequent encounter: Secondary | ICD-10-CM | POA: Diagnosis not present

## 2015-04-20 DIAGNOSIS — I4581 Long QT syndrome: Secondary | ICD-10-CM | POA: Diagnosis not present

## 2015-04-20 DIAGNOSIS — T424X2A Poisoning by benzodiazepines, intentional self-harm, initial encounter: Secondary | ICD-10-CM | POA: Diagnosis not present

## 2015-04-20 DIAGNOSIS — F3163 Bipolar disorder, current episode mixed, severe, without psychotic features: Secondary | ICD-10-CM | POA: Diagnosis not present

## 2015-04-20 LAB — ACETAMINOPHEN LEVEL: Acetaminophen (Tylenol), Serum: <10 ug/mL — ABNORMAL LOW (ref 10–30)

## 2015-04-20 LAB — BLOOD GAS, ARTERIAL
ACID-BASE DEFICIT: 0 mmol/L (ref 0.0–2.0)
Bicarbonate: 25.8 mEq/L — ABNORMAL HIGH (ref 20.0–24.0)
DRAWN BY: 441261
FIO2: 1
O2 SAT: 99.8 %
PEEP: 5 cmH2O
PH ART: 7.331 — AB (ref 7.350–7.450)
Patient temperature: 98.6
RATE: 14 resp/min
TCO2: 23.9 mmol/L (ref 0–100)
VT: 440 mL
pCO2 arterial: 50.2 mmHg — ABNORMAL HIGH (ref 35.0–45.0)
pO2, Arterial: 518 mmHg — ABNORMAL HIGH (ref 80.0–100.0)

## 2015-04-20 LAB — COMPREHENSIVE METABOLIC PANEL
ALT: 28 U/L (ref 14–54)
ANION GAP: 13 (ref 5–15)
AST: 28 U/L (ref 15–41)
Albumin: 4 g/dL (ref 3.5–5.0)
Alkaline Phosphatase: 80 U/L (ref 38–126)
BUN: 13 mg/dL (ref 6–20)
CHLORIDE: 109 mmol/L (ref 101–111)
CO2: 26 mmol/L (ref 22–32)
CREATININE: 0.84 mg/dL (ref 0.44–1.00)
Calcium: 9.1 mg/dL (ref 8.9–10.3)
Glucose, Bld: 102 mg/dL — ABNORMAL HIGH (ref 65–99)
Potassium: 3.3 mmol/L — ABNORMAL LOW (ref 3.5–5.1)
Sodium: 148 mmol/L — ABNORMAL HIGH (ref 135–145)
Total Bilirubin: 0.8 mg/dL (ref 0.3–1.2)
Total Protein: 7.2 g/dL (ref 6.5–8.1)

## 2015-04-20 LAB — CBC WITH DIFFERENTIAL/PLATELET
Basophils Absolute: 0 10*3/uL (ref 0.0–0.1)
Basophils Relative: 0 %
EOS ABS: 0.2 10*3/uL (ref 0.0–0.7)
EOS PCT: 2 %
HCT: 35 % — ABNORMAL LOW (ref 36.0–46.0)
Hemoglobin: 11.2 g/dL — ABNORMAL LOW (ref 12.0–15.0)
LYMPHS ABS: 1.4 10*3/uL (ref 0.7–4.0)
Lymphocytes Relative: 18 %
MCH: 31 pg (ref 26.0–34.0)
MCHC: 32 g/dL (ref 30.0–36.0)
MCV: 97 fL (ref 78.0–100.0)
MONOS PCT: 7 %
Monocytes Absolute: 0.6 10*3/uL (ref 0.1–1.0)
Neutro Abs: 5.8 10*3/uL (ref 1.7–7.7)
Neutrophils Relative %: 73 %
PLATELETS: 258 10*3/uL (ref 150–400)
RBC: 3.61 MIL/uL — AB (ref 3.87–5.11)
RDW: 14.1 % (ref 11.5–15.5)
WBC: 8 10*3/uL (ref 4.0–10.5)

## 2015-04-20 LAB — RAPID URINE DRUG SCREEN, HOSP PERFORMED
AMPHETAMINES: POSITIVE — AB
BARBITURATES: NOT DETECTED
Benzodiazepines: POSITIVE — AB
COCAINE: NOT DETECTED
OPIATES: NOT DETECTED
TETRAHYDROCANNABINOL: NOT DETECTED

## 2015-04-20 LAB — URINALYSIS, ROUTINE W REFLEX MICROSCOPIC
BILIRUBIN URINE: NEGATIVE
Glucose, UA: NEGATIVE mg/dL
Ketones, ur: NEGATIVE mg/dL
Nitrite: POSITIVE — AB
PH: 6 (ref 5.0–8.0)
Protein, ur: NEGATIVE mg/dL
SPECIFIC GRAVITY, URINE: 1.02 (ref 1.005–1.030)

## 2015-04-20 LAB — URINE MICROSCOPIC-ADD ON

## 2015-04-20 LAB — APTT: aPTT: 31 seconds (ref 24–37)

## 2015-04-20 LAB — I-STAT BETA HCG BLOOD, ED (MC, WL, AP ONLY): I-stat hCG, quantitative: <5 m[IU]/mL (ref <5)

## 2015-04-20 LAB — GLUCOSE, CAPILLARY: Glucose-Capillary: 107 mg/dL — ABNORMAL HIGH (ref 65–99)

## 2015-04-20 LAB — AMYLASE: AMYLASE: 50 U/L (ref 28–100)

## 2015-04-20 LAB — TROPONIN I

## 2015-04-20 LAB — CBG MONITORING, ED: GLUCOSE-CAPILLARY: 100 mg/dL — AB (ref 65–99)

## 2015-04-20 LAB — ETHANOL: Alcohol, Ethyl (B): <5 mg/dL (ref <5)

## 2015-04-20 LAB — PROTIME-INR
INR: 0.97 (ref 0.00–1.49)
Prothrombin Time: 13.1 seconds (ref 11.6–15.2)

## 2015-04-20 LAB — LIPASE, BLOOD: LIPASE: 26 U/L (ref 11–51)

## 2015-04-20 LAB — OSMOLALITY: Osmolality: 308 mOsm/kg — ABNORMAL HIGH (ref 275–295)

## 2015-04-20 LAB — LACTIC ACID, PLASMA: Lactic Acid, Venous: 1.6 mmol/L (ref 0.5–2.0)

## 2015-04-20 LAB — SALICYLATE LEVEL: Salicylate Lvl: <4 mg/dL (ref 2.8–30.0)

## 2015-04-20 MED ORDER — CHLORHEXIDINE GLUCONATE 0.12% ORAL RINSE (MEDLINE KIT)
15.0000 mL | Freq: Two times a day (BID) | OROMUCOSAL | Status: DC
  Administered 2015-04-21 (×2): 15 mL via OROMUCOSAL

## 2015-04-20 MED ORDER — ETOMIDATE 2 MG/ML IV SOLN
0.3000 mg/kg | Freq: Once | INTRAVENOUS | Status: AC
  Administered 2015-04-20: 20 mg via INTRAVENOUS

## 2015-04-20 MED ORDER — LORAZEPAM 2 MG/ML IJ SOLN
INTRAMUSCULAR | Status: AC
  Filled 2015-04-20: qty 1

## 2015-04-20 MED ORDER — HEPARIN SODIUM (PORCINE) 5000 UNIT/ML IJ SOLN
5000.0000 [IU] | Freq: Three times a day (TID) | INTRAMUSCULAR | Status: DC
  Administered 2015-04-20 – 2015-04-27 (×21): 5000 [IU] via SUBCUTANEOUS
  Filled 2015-04-20 (×22): qty 1

## 2015-04-20 MED ORDER — DEXMEDETOMIDINE HCL IN NACL 200 MCG/50ML IV SOLN
0.2000 ug/kg/h | INTRAVENOUS | Status: DC
  Filled 2015-04-20: qty 50

## 2015-04-20 MED ORDER — MIDAZOLAM HCL 2 MG/2ML IJ SOLN
1.0000 mg | INTRAMUSCULAR | Status: DC | PRN
  Administered 2015-04-20 (×2): 2 mg via INTRAVENOUS
  Administered 2015-04-21: 1 mg via INTRAVENOUS
  Filled 2015-04-20 (×5): qty 2

## 2015-04-20 MED ORDER — SODIUM CHLORIDE 0.9 % IV BOLUS (SEPSIS)
500.0000 mL | Freq: Once | INTRAVENOUS | Status: AC
  Administered 2015-04-20: 500 mL via INTRAVENOUS

## 2015-04-20 MED ORDER — SODIUM CHLORIDE 0.9 % IV BOLUS (SEPSIS)
1000.0000 mL | Freq: Once | INTRAVENOUS | Status: AC
  Administered 2015-04-20: 1000 mL via INTRAVENOUS

## 2015-04-20 MED ORDER — ONDANSETRON HCL 4 MG/2ML IJ SOLN: 4.0000 mg | Freq: Four times a day (QID) | INTRAMUSCULAR | Status: DC | PRN

## 2015-04-20 MED ORDER — INSULIN ASPART 100 UNIT/ML ~~LOC~~ SOLN
0.0000 [IU] | SUBCUTANEOUS | Status: DC
  Administered 2015-04-21 – 2015-04-22 (×2): 1 [IU] via SUBCUTANEOUS

## 2015-04-20 MED ORDER — VITAMIN B-1 100 MG PO TABS
100.0000 mg | ORAL_TABLET | Freq: Every day | ORAL | Status: DC
  Administered 2015-04-22 – 2015-04-26 (×5): 100 mg via ORAL
  Filled 2015-04-20 (×8): qty 1

## 2015-04-20 MED ORDER — NALOXONE HCL 2 MG/2ML IJ SOSY
PREFILLED_SYRINGE | INTRAMUSCULAR | Status: AC
  Filled 2015-04-20: qty 2

## 2015-04-20 MED ORDER — LORAZEPAM 2 MG/ML IJ SOLN
1.0000 mg | Freq: Once | INTRAMUSCULAR | Status: AC
  Administered 2015-04-20: 1 mg via INTRAVENOUS

## 2015-04-20 MED ORDER — SODIUM CHLORIDE 0.9 % IV SOLN
INTRAVENOUS | Status: DC
  Administered 2015-04-20: 1000 mL via INTRAVENOUS
  Administered 2015-04-20 – 2015-04-22 (×2): via INTRAVENOUS

## 2015-04-20 MED ORDER — FOLIC ACID 1 MG PO TABS
1.0000 mg | ORAL_TABLET | Freq: Every day | ORAL | Status: DC
  Administered 2015-04-22 – 2015-04-27 (×6): 1 mg via ORAL
  Filled 2015-04-20 (×8): qty 1

## 2015-04-20 MED ORDER — NALOXONE HCL 0.4 MG/ML IJ SOLN
INTRAMUSCULAR | Status: AC
  Filled 2015-04-20: qty 1

## 2015-04-20 MED ORDER — HALOPERIDOL LACTATE 5 MG/ML IJ SOLN
INTRAMUSCULAR | Status: AC
  Administered 2015-04-20: 5 mg via INTRAMUSCULAR
  Filled 2015-04-20: qty 1

## 2015-04-20 MED ORDER — MIDAZOLAM HCL 2 MG/2ML IJ SOLN
4.0000 mg | Freq: Once | INTRAMUSCULAR | Status: AC
  Administered 2015-04-20: 4 mg via INTRAVENOUS

## 2015-04-20 MED ORDER — SODIUM CHLORIDE 0.9 % IV SOLN: 250.0000 mL | INTRAVENOUS | Status: DC | PRN

## 2015-04-20 MED ORDER — LORAZEPAM 2 MG/ML IJ SOLN
2.0000 mg | Freq: Once | INTRAMUSCULAR | Status: AC
  Administered 2015-04-20: 2 mg via INTRAMUSCULAR

## 2015-04-20 MED ORDER — ADULT MULTIVITAMIN W/MINERALS CH
1.0000 | ORAL_TABLET | Freq: Every day | ORAL | Status: DC
  Administered 2015-04-22 – 2015-04-27 (×6): 1 via ORAL
  Filled 2015-04-20 (×8): qty 1

## 2015-04-20 MED ORDER — LORAZEPAM 2 MG/ML IJ SOLN
1.0000 mg | INTRAMUSCULAR | Status: DC | PRN
  Administered 2015-04-20 – 2015-04-23 (×8): 2 mg via INTRAVENOUS
  Filled 2015-04-20 (×9): qty 1

## 2015-04-20 MED ORDER — DEXTROSE 5 % IV SOLN
INTRAVENOUS | Status: DC
  Administered 2015-04-20 – 2015-04-22 (×4): via INTRAVENOUS
  Filled 2015-04-20 (×4): qty 150

## 2015-04-20 MED ORDER — FAMOTIDINE IN NACL 20-0.9 MG/50ML-% IV SOLN
20.0000 mg | Freq: Two times a day (BID) | INTRAVENOUS | Status: DC
  Administered 2015-04-20 – 2015-04-21 (×2): 20 mg via INTRAVENOUS
  Filled 2015-04-20 (×2): qty 50

## 2015-04-20 MED ORDER — ANTISEPTIC ORAL RINSE SOLUTION (CORINZ): 7.0000 mL | Freq: Four times a day (QID) | OROMUCOSAL | Status: DC

## 2015-04-20 MED ORDER — LORAZEPAM 2 MG/ML IJ SOLN
INTRAMUSCULAR | Status: AC
  Administered 2015-04-20: 5 mg via INTRAMUSCULAR
  Filled 2015-04-20: qty 1

## 2015-04-20 MED ORDER — DEXMEDETOMIDINE HCL IN NACL 200 MCG/50ML IV SOLN
0.0000 ug/kg/h | INTRAVENOUS | Status: DC
  Administered 2015-04-20: 0.3 ug/kg/h via INTRAVENOUS
  Administered 2015-04-20: 0.4 ug/kg/h via INTRAVENOUS
  Administered 2015-04-21 (×2): 1.2 ug/kg/h via INTRAVENOUS
  Filled 2015-04-20 (×6): qty 50

## 2015-04-20 MED ORDER — LORAZEPAM 2 MG/ML IJ SOLN
2.0000 mg | Freq: Once | INTRAMUSCULAR | Status: AC
  Administered 2015-04-20: 5 mg via INTRAMUSCULAR
  Administered 2015-04-20: 2 mg via INTRAMUSCULAR
  Filled 2015-04-20: qty 1

## 2015-04-20 MED ORDER — HALOPERIDOL LACTATE 5 MG/ML IJ SOLN
5.0000 mg | Freq: Once | INTRAMUSCULAR | Status: AC
  Administered 2015-04-20: 5 mg via INTRAMUSCULAR
  Filled 2015-04-20: qty 1

## 2015-04-20 MED ORDER — LORAZEPAM 2 MG/ML IJ SOLN
INTRAMUSCULAR | Status: AC
  Filled 2015-04-20: qty 3

## 2015-04-20 MED ORDER — NALOXONE HCL 0.4 MG/ML IJ SOLN
0.4000 mg | Freq: Once | INTRAMUSCULAR | Status: AC
  Administered 2015-04-20: 0.4 mg via INTRAVENOUS

## 2015-04-20 NOTE — H&P (Signed)
PULMONARY / CRITICAL CARE MEDICINE   Name: Brittany [name] MRN: [id number] DOB: [birth year]    ADMISSION DATE:  04/20/2015 CONSULTATION DATE:  04/20/15  REFERRING MD:  Pecola Leisure, MD  CHIEF COMPLAINT:  Drug OD  HISTORY OF PRESENT ILLNESS:   50 yr old h/o bipola dz. Brought in by EMS with change in MS, lethargic, a notion of info about possible tryclcic OD. Although, given Narcan and woke up with severe agitation. Lost her IV access. IM haldol, ativan attempting. NO other history available. She is currently trying to hurt providers severely.  PAST MEDICAL HISTORY :  She  has a past medical history of Hypertension; Bipolar 1 disorder (HCC); Anxiety; Bulging lumbar disc; Ruptured intervertebral disc; Head injury; Short-term memory loss; Depression; Hypothyroidism; Goiter; Facial pain (11/2013); Facial nerve injury; Hyperlipidemia; Heart murmur; Chronic bronchitis (HCC); GERD (gastroesophageal reflux disease); Migraine; Seizures (HCC); and Chronic lower back pain.  PAST SURGICAL HISTORY: She  has past surgical history that includes Tonsillectomy; Abdominal hysterectomy; Cesarean section (2004); and Eye surgery (Right, 11/2013).  Allergies  Allergen Reactions  . Toradol [Ketorolac Tromethamine] Swelling  . Suboxone [Buprenorphine Hcl-Naloxone Hcl]     cellulitis   . Tegretol [Carbamazepine] Rash  . Trazodone And Nefazodone Other (See Comments)    Bladder infection    No current facility-administered medications on file prior to encounter.   Current Outpatient Prescriptions on File Prior to Encounter  Medication Sig  . Cholecalciferol (VITAMIN D PO) Take 1 tablet by mouth every other day.  . cyanocobalamin 1000 MCG tablet Take 1,000 mcg by mouth daily.  . DULoxetine (CYMBALTA) 60 MG capsule Take 60 mg by mouth daily.  Marland Kitchen gabapentin (NEURONTIN) 300 MG capsule Take 2 capsules (600 mg total) by mouth 3 (three) times daily.  Marland Kitchen levothyroxine (SYNTHROID, LEVOTHROID) 50 MCG tablet Take 50 mcg by mouth  daily.  . Multiple Vitamin (MULTIVITAMIN WITH MINERALS) TABS tablet Take 1 tablet by mouth every other day.  . nortriptyline (PAMELOR) 25 MG capsule Take 1 capsule (25 mg total) by mouth 2 (two) times daily.  . pantoprazole (PROTONIX) 40 MG tablet Take 1 tablet (40 mg total) by mouth 2 (two) times daily.  . pravastatin (PRAVACHOL) 40 MG tablet Take 40 mg by mouth daily.    FAMILY HISTORY:  Her indicated that her mother is alive. She indicated that her father is deceased. She indicated that her sister is alive.   SOCIAL HISTORY: She  reports that she has quit smoking. Her smoking use included Cigarettes. She has never used smokeless tobacco. She reports that she drinks alcohol. She reports that she uses illicit drugs (Marijuana).  REVIEW OF SYSTEMS:   unable  SUBJECTIVE:  Severe agitation "fuck You asshole"  VITAL SIGNS: BP 131/94 mmHg  Pulse 111  Temp(Src) 98.2 F (36.8 C) (Oral)  Resp 14  SpO2 98%  HEMODYNAMICS:    VENTILATOR SETTINGS:    INTAKE / OUTPUT:    PHYSICAL EXAMINATION: General:  Severe aggotation,  Neuro:  Moving all ext well hitting providers HEENT:  obese Cardiovascular:  s1 s2 RRT distant Lungs:  CTA Abdomen:  Sioft, obese, NT, ND Musculoskeletal: min edema Skin:  Erythema rash lines uner arms  LABS:  BMET No results for input(s): NA, K, CL, CO2, BUN, CREATININE, GLUCOSE in the last 168 hours.  Electrolytes No results for input(s): CALCIUM, MG, PHOS in the last 168 hours.  CBC No results for input(s): WBC, HGB, HCT, PLT in the last 168 hours.  Coag's No results for input(s): APTT,  INR in the last 168 hours.  Sepsis Markers No results for input(s): LATICACIDVEN, PROCALCITON, O2SATVEN in the last 168 hours.  ABG No results for input(s): PHART, PCO2ART, PO2ART in the last 168 hours.  Liver Enzymes No results for input(s): AST, ALT, ALKPHOS, BILITOT, ALBUMIN in the last 168 hours.  Cardiac Enzymes No results for input(s): TROPONINI,  PROBNP in the last 168 hours.  Glucose No results for input(s): GLUCAP in the last 168 hours.  Imaging No results found.   STUDIES:  pending  CULTURES:   ANTIBIOTICS:   SIGNIFICANT EVENTS: 3/7- severe psychosis, woke up after narcan for sure  LINES/TUBES: Have none yet   ASSESSMENT / PLAN:  PULMONARY A: At risk intubation with possible higher dose enzo needs P:   STAT pcxr when able to do so O2 when able  CARDIOVASCULAR A:  ST, see neuro, At risk QRS and qtc prolongation if OD tricyclic P:  Volume resuscitation Tele when she will allow ecg q4h for qrs,qtc Bicarb for concern tricyclic   RENAL A:   R/o dehydration P:   Volume Bicarb Chem when she allows  GASTROINTESTINAL A:   NPO P:   ppi needed Get lft  HEMATOLOGIC A:   dvt prevention P:  Sub q hep Cbc needed scd  INFECTIOUS A:   No evidence, at risk asp P:   Get pcxr  ENDOCRINE A:   Assess for hypoglcyemia P:   cbg  NEUROLOGIC A:   OD, likely polysubstance, r/o tricyclic, r/o narcotics with excellent response to narcan Bipolar do, r/o flare R/o +ser syndrome P:   RASS goal: 0 to -1 IM haldol, only option IM ativan Then will place line, start precedex Bicarb drip Send UDS< sal, tylenal level, serum osm Sitter May need ett if high benzo IV needed  FAMILY  - Updates: no family  - Inter-disciplinary family meet or Palliative Care meeting due by: 7 days  Ccm time 45 min   Mcarthur Rossettianiel J. Tyson AliasFeinstein, MD, FACP Pgr: [phone] [facility] ' [facility]  HealthCare Pager: [phone]  04/20/2015, 5:00 PM

## 2015-04-20 NOTE — Procedures (Addendum)
Intubation Procedure Note Brittany [name] [id number] [birth year]  Procedure: Intubation Indications: Airway protection and maintenance  Procedure Details Consent: Unable to obtain consent because of emergent medical necessity. Time Out: Verified patient identification, verified procedure, site/side was marked, verified correct patient position, special equipment/implants available, medications/allergies/relevent history reviewed, required imaging and test results available.  Performed  Maximum sterile technique was used including gloves, gown, hand hygiene and mask.  MAC and 3    Evaluation Hemodynamic Status: BP stable throughout; O2 sats: stable throughout Patient's Current Condition: stable Complications: No apparent complications Patient did tolerate procedure well. Chest X-ray ordered to verify placement.  CXR: pending.   Nelda BucksFEINSTEIN,DANIEL J. 04/20/2015  Glide  Mcarthur Rossettianiel J. Tyson AliasFeinstein, MD, FACP Pgr: [phone] [facility]   Additional management,. More IV ativan, haldol, limited affect, obtunded , concern airway contr9ol ETT placed, precedex, IV access obtain ETT, mech vent, abg, pcxr, 8 cc/kg rass goal -2 May fail precedexon vent, if so will use prop  Avoid fent for now as likley narc OD  Ccm time 90 min   Mcarthur Rossettianiel J. Tyson AliasFeinstein, MD, FACP Pgr: [phone] [facility]

## 2015-04-20 NOTE — ED Notes (Signed)
Warming blanket applied due to patient's core temp being 96 degrees F.

## 2015-04-20 NOTE — Progress Notes (Signed)
eLink Physician-Brief Progress Note Patient Name: Brittany [name] DOB: 10-07-1965 MRN: [id number]   Date of Service  04/20/2015  HPI/Events of Note  Notified by nurse of downward trending blood pressure. Currently on Precedex infusion. Reportedly patient sedated & noncombative.  eICU Interventions  Normal saline 500 mL bolus IV. Nurse instructed to notify me if pressure does not respond to fluid bolus.     Intervention Category Intermediate Interventions: Hypotension - evaluation and management  [name] 04/20/2015, 8:48 PM

## 2015-04-20 NOTE — ED Notes (Signed)
Bed: RESB Expected date:  Expected time:  Means of arrival:  Comments: EMS Overdose 

## 2015-04-20 NOTE — Progress Notes (Signed)
eLink Physician-Brief Progress Note Patient Name: Brittany KidneyKimberley Davis DOB: 06/04/1965 MRN: [id number]   Date of Service  04/20/2015  HPI/Events of Note  Hypotension - BP = 66/30. Sedation being titrated down.  eICU Interventions  Will order 0.9 NaCl 1 liter IV over 1 hour now.      Intervention Category Major Interventions: Hypotension - evaluation and management  Sommer,Steven Eugene 04/20/2015, 11:30 PM

## 2015-04-20 NOTE — Progress Notes (Signed)
eLink Physician-Brief Progress Note Patient Name: Brittany [name] DOB: 1965-09-27 MRN: [id number]   Date of Service  04/20/2015  HPI/Events of Note  Request for bedside sitter and an order for suicide precautions.  eICU Interventions  Will order: 1. Bedside sitter. 2. Suicide precautions.     Intervention Category Intermediate Interventions: Other:  Lenell AntuSommer,Kavita Bartl Eugene 04/20/2015, 11:20 PM

## 2015-04-20 NOTE — ED Provider Notes (Signed)
CSN: [id number]     Arrival date & time 04/20/15  1614 History   First MD Initiated Contact with Patient 04/20/15 1626     No chief complaint on file.    The history is provided by the EMS personnel. No language interpreter was used.   Brittany [name] is a 50 y.o. female who presents to the Emergency Department complaining of overdose.  Level V caveat due to unresponsiveness.  Hx is provided by EMS.  Per report she overdosed sometime earlier today on elavil, clonazepam, amitryptiline.  Unknown amounts of ingestion, unknown time.  Per EMS pt with calm and disoriented, pt unresponsive on ED arrival.    Past Medical History  Diagnosis Date  . Hypertension   . Bipolar 1 disorder (HCC)   . Anxiety   . Bulging lumbar disc   . Ruptured intervertebral disc   . Head injury   . Short-term memory loss   . Depression   . Hypothyroidism   . Goiter   . Facial pain 11/2013    walking dog when she tripped and "faceplanted" landing on her face.   . Facial nerve injury   . Hyperlipidemia   . Heart murmur   . Chronic bronchitis (HCC)   . GERD (gastroesophageal reflux disease)   . Migraine     "weekly" (03/23/2015)  . Seizures (HCC)     "I've had a few; had a grand mal when I took Neurotin; still take Neurotin" (03/23/2015)  . Chronic lower back pain    Past Surgical History  Procedure Laterality Date  . Tonsillectomy    . Abdominal hysterectomy    . Cesarean section  2004  . Eye surgery Right 11/2013    emergent lateral canthotomy due to proptosis  /notes 11/30/2013   Family History  Problem Relation Age of Onset  . Migraines Mother   . Migraines Father   . Other Father   . Migraines Sister    Social History  Substance Use Topics  . Smoking status: Former Smoker    Types: Cigarettes  . Smokeless tobacco: Never Used     Comment: "smoked cigarettes when I was 50 years old"  . Alcohol Use: Yes     Comment: 03/23/2015 "might drink at Christmastime"   OB History    No data available      Review of Systems  All other systems reviewed and are negative.     Allergies  Toradol; Suboxone; Tegretol; and Trazodone and nefazodone  Home Medications   Prior to Admission medications   Medication Sig Start Date End Date Taking? Authorizing Provider  DULoxetine (CYMBALTA) 60 MG capsule Take 60 mg by mouth daily.   Yes Historical Provider, MD  levothyroxine (SYNTHROID, LEVOTHROID) 100 MCG tablet Take 100 mcg by mouth daily before breakfast.   Yes Historical Provider, MD  baclofen (LIORESAL) 10 MG tablet TK 1 T PO TID PRN 04/16/15   Historical Provider, MD  Benzphetamine HCl 25 MG TABS TK 1 T PO QD 04/16/15   Historical Provider, MD  Cholecalciferol (VITAMIN D PO) Take 1 tablet by mouth every other day.    Historical Provider, MD  clonazePAM (KLONOPIN) 1 MG tablet TK 1 T PO QID 04/14/15   Historical Provider, MD  cyanocobalamin 1000 MCG tablet Take 1,000 mcg by mouth daily.    Historical Provider, MD  cyclobenzaprine (FLEXERIL) 10 MG tablet TK 1 T PO TID PRN 03/13/15   Historical Provider, MD  gabapentin (NEURONTIN) 300 MG capsule Take 2 capsules (600  mg total) by mouth 3 (three) times daily. Patient not taking: Reported on 04/20/2015 03/27/15   Zannie CovePreetha Joseph, MD  gabapentin (NEURONTIN) 800 MG tablet TK 1 T PO TID AND 2 QHS 04/14/15   Historical Provider, MD  Multiple Vitamin (MULTIVITAMIN WITH MINERALS) TABS tablet Take 1 tablet by mouth every other day.    Historical Provider, MD  nortriptyline (PAMELOR) 25 MG capsule Take 1 capsule (25 mg total) by mouth 2 (two) times daily. Patient taking differently: Take 25 mg by mouth 4 (four) times daily.  01/05/15   Levert FeinsteinYijun Yan, MD  pantoprazole (PROTONIX) 40 MG tablet Take 1 tablet (40 mg total) by mouth 2 (two) times daily. 03/10/15   Leroy SeaPrashant K Singh, MD  pravastatin (PRAVACHOL) 40 MG tablet Take 40 mg by mouth daily.    Historical Provider, MD   BP 66/30 mmHg  Pulse 65  Temp(Src) 97.7 F (36.5 C) (Oral)  Resp 16  Ht 5\' 4"  (1.626 m)  Wt 240 lb  11.9 oz (109.2 kg)  BMI 41.30 kg/m2  SpO2 99% Physical Exam  Constitutional: She appears well-developed and well-nourished.  HENT:  Head: Normocephalic and atraumatic.  Eyes:  Pupils midsized and reactive  Cardiovascular: Regular rhythm.   No murmur heard. tachycardic  Pulmonary/Chest: Effort normal and breath sounds normal. No respiratory distress.  Abdominal: Soft. There is no tenderness. There is no rebound and no guarding.  Musculoskeletal: She exhibits no edema or tenderness.  Neurological:  Lethargic, responds to deep painful stimuli  Skin: Skin is warm and dry.  Psychiatric:  Unable to assess  Nursing note and vitals reviewed.   ED Course  Procedures (including critical care time) CRITICAL CARE Performed by: Tilden FossaElizabeth Deloria Brassfield   Total critical care time: 30 minutes  Critical care time was exclusive of separately billable procedures and treating other patients.  Critical care was necessary to treat or prevent imminent or life-threatening deterioration.  Critical care was time spent personally by me on the following activities: development of treatment plan with patient and/or surrogate as well as nursing, discussions with consultants, evaluation of patient's response to treatment, examination of patient, obtaining history from patient or surrogate, ordering and performing treatments and interventions, ordering and review of laboratory studies, ordering and review of radiographic studies, pulse oximetry and re-evaluation of patient's condition.  Labs Review Labs Reviewed  URINE RAPID DRUG SCREEN, HOSP PERFORMED - Abnormal; Notable for the following:    Benzodiazepines POSITIVE (*)    Amphetamines POSITIVE (*)    All other components within normal limits  COMPREHENSIVE METABOLIC PANEL - Abnormal; Notable for the following:    Sodium 148 (*)    Potassium 3.3 (*)    Glucose, Bld 102 (*)    All other components within normal limits  CBC WITH DIFFERENTIAL/PLATELET -  Abnormal; Notable for the following:    RBC 3.61 (*)    Hemoglobin 11.2 (*)    HCT 35.0 (*)    All other components within normal limits  URINALYSIS, ROUTINE W REFLEX MICROSCOPIC (NOT [facility]) - Abnormal; Notable for the following:    APPearance TURBID (*)    Hgb urine dipstick TRACE (*)    Nitrite POSITIVE (*)    Leukocytes, UA LARGE (*)    All other components within normal limits  ACETAMINOPHEN LEVEL - Abnormal; Notable for the following:    Acetaminophen (Tylenol), Serum <10 (*)    All other components within normal limits  OSMOLALITY - Abnormal; Notable for the following:    Osmolality 308 (*)  All other components within normal limits  BLOOD GAS, ARTERIAL - Abnormal; Notable for the following:    pH, Arterial 7.331 (*)    pCO2 arterial 50.2 (*)    pO2, Arterial 518 (*)    Bicarbonate 25.8 (*)    All other components within normal limits  URINE MICROSCOPIC-ADD ON - Abnormal; Notable for the following:    Squamous Epithelial / LPF 6-30 (*)    Bacteria, UA MANY (*)    All other components within normal limits  GLUCOSE, CAPILLARY - Abnormal; Notable for the following:    Glucose-Capillary 107 (*)    All other components within normal limits  CBG MONITORING, ED - Abnormal; Notable for the following:    Glucose-Capillary 100 (*)    All other components within normal limits  MRSA PCR SCREENING  ETHANOL  LIPASE, BLOOD  AMYLASE  TROPONIN I  LACTIC ACID, PLASMA  PROTIME-INR  APTT  SALICYLATE LEVEL  TROPONIN I  TROPONIN I  CBC  BASIC METABOLIC PANEL  MAGNESIUM  PHOSPHORUS  DRUGS OF ABUSE SCREEN W ALC, ROUTINE URINE  I-STAT CHEM 8, ED  I-STAT BETA HCG BLOOD, ED ([facility], [facility], [facility] ONLY)    Imaging Review Portable Chest Xray  04/20/2015  CLINICAL DATA:  Endotracheal tube placement. EXAM: PORTABLE CHEST 1 VIEW COMPARISON:  03/02/2015 FINDINGS: The endotracheal tube tip projects 2.5 cm above the carina, well positioned. There is streaky opacity in the left lung base, likely  atelectasis. This is similar to the prior study. Right basilar atelectasis has improved. No new lung opacities. No evidence of pulmonary edema. No convincing pleural effusion and no pneumothorax. Cardiac silhouette is normal in size. IMPRESSION: 1. Endotracheal tube is well positioned with its tip 2.5 cm above the carina. Electronically Signed   By: Amie Portland M.D.   On: 04/20/2015 18:24   I have personally reviewed and evaluated these images and lab results as part of my medical decision-making.   EKG Interpretation   Date/Time:  Tuesday April 20 2015 18:46:25 EST Ventricular Rate:  85 PR Interval:  193 QRS Duration: 99 QT Interval:  415 QTC Calculation: 493 R Axis:   33 Text Interpretation:  Sinus rhythm Low voltage, precordial leads  Borderline prolonged QT interval Confirmed by Lincoln Brigham [phone]) on  04/20/2015 7:09:12 PM      MDM   Final diagnoses:  Drug overdose  Drug overdose, accidental or unintentional, initial encounter    Patient here for evaluation following an overdose. Patient unresponsive on ED arrival, given dose of Narcan and she became extremely agitated and aggressive. She required multiple people to restrain her. She was given multiple doses of Ativan and Haldol for sedation. Plan to admit to critical care for further management of her overdose of multiple unknown agents.  Tilden Fossa, MD 04/21/15 0003

## 2015-04-20 NOTE — Progress Notes (Signed)
Pt transported to ICU on [facility] without complication.  RT to monitor and assess as needed.  

## 2015-04-20 NOTE — ED Notes (Signed)
Pt presents via EMS with c/o overdose. EMS was called to the scene earlier today, pt was combative. When EMS came back to the scene, pt was calm but very disoriented. Pt's fiance reports she took "elavil, clozopam, triptiline". EMS unsure of exactly what it is that pt took or how much she took. Pt is unresponsive at this time. Pt is 100% NRB, 22IV left hand, fluid bolus running per EMS.

## 2015-04-21 ENCOUNTER — Inpatient Hospital Stay (HOSPITAL_COMMUNITY): Payer: Medicaid Other

## 2015-04-21 ENCOUNTER — Encounter (HOSPITAL_COMMUNITY): Payer: Self-pay

## 2015-04-21 DIAGNOSIS — G9340 Encephalopathy, unspecified: Secondary | ICD-10-CM

## 2015-04-21 DIAGNOSIS — T50901A Poisoning by unspecified drugs, medicaments and biological substances, accidental (unintentional), initial encounter: Secondary | ICD-10-CM

## 2015-04-21 DIAGNOSIS — J9601 Acute respiratory failure with hypoxia: Secondary | ICD-10-CM

## 2015-04-21 LAB — PHOSPHORUS: Phosphorus: 2.5 mg/dL (ref 2.5–4.6)

## 2015-04-21 LAB — CBC
HEMATOCRIT: 29.9 % — AB (ref 36.0–46.0)
Hemoglobin: 9.5 g/dL — ABNORMAL LOW (ref 12.0–15.0)
MCH: 30.7 pg (ref 26.0–34.0)
MCHC: 31.8 g/dL (ref 30.0–36.0)
MCV: 96.8 fL (ref 78.0–100.0)
PLATELETS: 194 10*3/uL (ref 150–400)
RBC: 3.09 MIL/uL — ABNORMAL LOW (ref 3.87–5.11)
RDW: 14.2 % (ref 11.5–15.5)
WBC: 6.2 10*3/uL (ref 4.0–10.5)

## 2015-04-21 LAB — MRSA PCR SCREENING: MRSA by PCR: NEGATIVE

## 2015-04-21 LAB — BASIC METABOLIC PANEL
ANION GAP: 6 (ref 5–15)
BUN: 11 mg/dL (ref 6–20)
CALCIUM: 7.2 mg/dL — AB (ref 8.9–10.3)
CO2: 25 mmol/L (ref 22–32)
Chloride: 115 mmol/L — ABNORMAL HIGH (ref 101–111)
Creatinine, Ser: 0.73 mg/dL (ref 0.44–1.00)
GLUCOSE: 124 mg/dL — AB (ref 65–99)
Potassium: 3.4 mmol/L — ABNORMAL LOW (ref 3.5–5.1)
Sodium: 146 mmol/L — ABNORMAL HIGH (ref 135–145)

## 2015-04-21 LAB — GLUCOSE, CAPILLARY
GLUCOSE-CAPILLARY: 120 mg/dL — AB (ref 65–99)
GLUCOSE-CAPILLARY: 100 mg/dL — AB (ref 65–99)
Glucose-Capillary: 94 mg/dL (ref 65–99)
Glucose-Capillary: 138 mg/dL — ABNORMAL HIGH (ref 65–99)
Glucose-Capillary: 121 mg/dL — ABNORMAL HIGH (ref 65–99)
Glucose-Capillary: 113 mg/dL — ABNORMAL HIGH (ref 65–99)

## 2015-04-21 LAB — MAGNESIUM: Magnesium: 1.9 mg/dL (ref 1.7–2.4)

## 2015-04-21 LAB — TROPONIN I: Troponin I: <0.03 ng/mL (ref <0.031)

## 2015-04-21 MED ORDER — CHLORHEXIDINE GLUCONATE 0.12 % MT SOLN
15.0000 mL | Freq: Two times a day (BID) | OROMUCOSAL | Status: DC
  Administered 2015-04-21 – 2015-04-27 (×11): 15 mL via OROMUCOSAL
  Filled 2015-04-21 (×10): qty 15

## 2015-04-21 MED ORDER — FENTANYL CITRATE (PF) 100 MCG/2ML IJ SOLN
12.5000 ug | INTRAMUSCULAR | Status: DC | PRN
  Administered 2015-04-23: 12.5 ug via INTRAVENOUS
  Filled 2015-04-21: qty 2

## 2015-04-21 MED ORDER — LORAZEPAM 2 MG/ML IJ SOLN
2.0000 mg | Freq: Three times a day (TID) | INTRAMUSCULAR | Status: DC
  Administered 2015-04-21 – 2015-04-24 (×9): 2 mg via INTRAVENOUS
  Filled 2015-04-21 (×10): qty 1

## 2015-04-21 MED ORDER — POTASSIUM CHLORIDE 20 MEQ/15ML (10%) PO SOLN
40.0000 meq | Freq: Once | ORAL | Status: AC
  Administered 2015-04-21: 40 meq
  Filled 2015-04-21: qty 30

## 2015-04-21 MED ORDER — CETYLPYRIDINIUM CHLORIDE 0.05 % MT LIQD
7.0000 mL | Freq: Two times a day (BID) | OROMUCOSAL | Status: DC
  Administered 2015-04-22 – 2015-04-27 (×9): 7 mL via OROMUCOSAL

## 2015-04-21 MED ORDER — FUROSEMIDE 10 MG/ML IJ SOLN
40.0000 mg | Freq: Once | INTRAMUSCULAR | Status: AC
  Administered 2015-04-21: 40 mg via INTRAVENOUS
  Filled 2015-04-21: qty 4

## 2015-04-21 MED ORDER — LORAZEPAM 2 MG/ML IJ SOLN: 2.0000 mg | Freq: Once | INTRAMUSCULAR | Status: AC

## 2015-04-21 MED ORDER — HALOPERIDOL LACTATE 5 MG/ML IJ SOLN
INTRAMUSCULAR | Status: AC
  Filled 2015-04-21: qty 1

## 2015-04-21 MED ORDER — DEXMEDETOMIDINE HCL IN NACL 200 MCG/50ML IV SOLN
0.4000 ug/kg/h | INTRAVENOUS | Status: DC
  Administered 2015-04-21: 1.2 ug/kg/h via INTRAVENOUS
  Administered 2015-04-21: 1.1 ug/kg/h via INTRAVENOUS
  Administered 2015-04-21 (×5): 1.2 ug/kg/h via INTRAVENOUS
  Administered 2015-04-21: 0.4 ug/kg/h via INTRAVENOUS
  Administered 2015-04-22 (×3): 1.1 ug/kg/h via INTRAVENOUS
  Administered 2015-04-22: 1.2 ug/kg/h via INTRAVENOUS
  Administered 2015-04-22: 1.1 ug/kg/h via INTRAVENOUS
  Administered 2015-04-22: 0.8 ug/kg/h via INTRAVENOUS
  Filled 2015-04-21 (×7): qty 50
  Filled 2015-04-21 (×2): qty 100
  Filled 2015-04-21 (×3): qty 50

## 2015-04-21 MED ORDER — CLONAZEPAM 0.5 MG PO TABS
0.5000 mg | ORAL_TABLET | Freq: Four times a day (QID) | ORAL | Status: DC
  Administered 2015-04-21 – 2015-04-22 (×2): 0.5 mg
  Filled 2015-04-21 (×2): qty 1

## 2015-04-21 MED ORDER — POTASSIUM CHLORIDE CRYS ER 20 MEQ PO TBCR: 40.0000 meq | EXTENDED_RELEASE_TABLET | Freq: Once | ORAL | Status: DC

## 2015-04-21 MED ORDER — SODIUM CHLORIDE 0.9 % IV BOLUS (SEPSIS)
1000.0000 mL | Freq: Once | INTRAVENOUS | Status: AC
  Administered 2015-04-21: 1000 mL via INTRAVENOUS

## 2015-04-21 MED ORDER — GABAPENTIN 100 MG PO CAPS
200.0000 mg | ORAL_CAPSULE | Freq: Three times a day (TID) | ORAL | Status: DC
Start: 2015-04-21 — End: 2015-04-22
  Administered 2015-04-22: 200 mg via ORAL
  Filled 2015-04-21 (×2): qty 2

## 2015-04-21 MED ORDER — BACLOFEN 10 MG PO TABS
10.0000 mg | ORAL_TABLET | Freq: Three times a day (TID) | ORAL | Status: DC
  Administered 2015-04-22 – 2015-04-26 (×13): 10 mg via ORAL
  Filled 2015-04-21 (×13): qty 1

## 2015-04-21 MED ORDER — HALOPERIDOL LACTATE 5 MG/ML IJ SOLN
2.0000 mg | Freq: Once | INTRAMUSCULAR | Status: AC
  Administered 2015-04-21: 2 mg via INTRAVENOUS

## 2015-04-21 MED ORDER — LEVOTHYROXINE SODIUM 100 MCG PO TABS
100.0000 ug | ORAL_TABLET | Freq: Every day | ORAL | Status: DC
  Administered 2015-04-22 – 2015-04-23 (×2): 100 ug
  Filled 2015-04-21 (×3): qty 1

## 2015-04-21 NOTE — Progress Notes (Signed)
eLink Physician-Brief Progress Note Patient Name: Brittany [name] DOB: Feb 15, 1965 MRN: [id number]   Date of Service  04/21/2015  HPI/Events of Note  Camera check on patient postextubation. Remains stable hemodynamically. Sleeping with eyes closed on nasal cannula oxygen. No acute distress.  eICU Interventions  Continue close monitoring in the intensive care unit for potential reintubation.     Intervention Category Intermediate Interventions: Other:  Lawanda CousinsJennings Ahlijah Raia 04/21/2015, 10:07 PM
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
## 2015-04-21 NOTE — Progress Notes (Signed)
PT CBG checked this am and is 58. Pt has no IV access adn is unable to take PO's. MD was paged for further orders.

## 2015-04-21 NOTE — Procedures (Signed)
Extubation Procedure Note  Patient Details:   Name: Brittany [name] DOB: Dec 14, 1965 MRN: [id number]   Airway Documentation:  Airway 7.5 mm (Active)  Secured at (cm) 24 cm 04/21/2015  8:45 AM  Measured From Lips 04/21/2015  8:45 AM  Secured Location Right 04/21/2015  3:04 AM  Secured By [employer] Tube Holder 04/21/2015  8:45 AM  Tube Holder Repositioned Yes 04/21/2015  8:45 AM  Cuff Pressure (cm H2O) 24 cm H2O 04/20/2015 10:39 PM  Site Condition Other (Comment) 04/20/2015  5:57 PM    Evaluation  O2 sats: stable throughout Complications: No apparent complications Patient did tolerate procedure well. Bilateral Breath Sounds: Clear, Diminished   Yes  Dairl PonderWalters, Fay Swider Nannette 04/21/2015, 10:37 AM   PT is fairly combative at this time- however, no respiratory distress at this time. RN at bedside.

## 2015-04-21 NOTE — Progress Notes (Signed)
eLink Physician-Brief Progress Note Patient Name: Brittany [name] DOB: Mar 03, 1965 MRN: [id number]   Date of Service  04/21/2015  HPI/Events of Note  Hypotension - BP = 74/35 s/p Versed and Ativan given for sedation.   eICU Interventions  Will bolus with 0.9 NaCl 1 liter IV over 1 hour now.      Intervention Category Major Interventions: Hypotension - evaluation and management  Keon Benscoter Eugene 04/21/2015, 12:36 AM

## 2015-04-21 NOTE — Progress Notes (Signed)
eLink Physician-Brief Progress Note Patient Name: Brittany [name] DOB: [birth year] MRN: [id number]   Date of Service  04/21/2015  HPI/Events of Note  Agtitation/Delirium - Still on Precedex at 0.3. QTc = 0.44  eICU Interventions  Will order: 1. Haldol 2 mg IV X 1 now. 2. 0.9 NaCl 1 liter IV over 1 hour now.  3. Again, would try to tritrate the Precedex IV infusion up as BP tolerates and try to avoid the bolus of benzodiazepines that appear to bottom her BP.      Intervention Category Major Interventions: Acute renal failure - evaluation and management;Delirium, psychosis, severe agitation - evaluation and management  Brittany [name] 04/21/2015, 4:10 AM

## 2015-04-21 NOTE — Care Management Note (Signed)
Case Management Note  Patient Details  Name: Brittany [name] MRN: [id number] Date of Birth: Oct 28, 1965  Subjective/Objective:       od            Date:  April 21, 2015 Chart reviewed for concurrent status and case management needs. Will continue to follow patient for changes and needs: Brittany [name], BSN, RN, [place]   [phone] Action/Plan:   Expected Discharge Date:                  Expected Discharge Plan:  Home/Self Care  In-House Referral:  Clinical Social Work  Discharge planning Services  CM Consult  Post Acute Care Choice:  NA Choice offered to:     DME Arranged:    DME Agency:     HH Arranged:    HH Agency:     Status of Service:  In process, will continue to follow  Medicare Important Message Given:    Date Medicare IM Given:    Medicare IM give by:    Date Additional Medicare IM Given:    Additional Medicare Important Message give by:     If discussed at Long Length of Stay Meetings, dates discussed:    Additional Comments:  Brittany [name], Brittany Lynn, RN 04/21/2015, 8:49 AM

## 2015-04-22 ENCOUNTER — Inpatient Hospital Stay (HOSPITAL_COMMUNITY): Payer: Medicaid Other

## 2015-04-22 LAB — BASIC METABOLIC PANEL
Anion gap: 9 (ref 5–15)
BUN: 8 mg/dL (ref 6–20)
CO2: 31 mmol/L (ref 22–32)
Calcium: 8.2 mg/dL — ABNORMAL LOW (ref 8.9–10.3)
Chloride: 109 mmol/L (ref 101–111)
Creatinine, Ser: 0.62 mg/dL (ref 0.44–1.00)
GFR calc Af Amer: >60 mL/min (ref >60)
GFR calc non Af Amer: >60 mL/min (ref >60)
Glucose, Bld: 116 mg/dL — ABNORMAL HIGH (ref 65–99)
Potassium: 3.2 mmol/L — ABNORMAL LOW (ref 3.5–5.1)
Sodium: 149 mmol/L — ABNORMAL HIGH (ref 135–145)

## 2015-04-22 LAB — GLUCOSE, CAPILLARY
GLUCOSE-CAPILLARY: 100 mg/dL — AB (ref 65–99)
GLUCOSE-CAPILLARY: 113 mg/dL — AB (ref 65–99)
GLUCOSE-CAPILLARY: 45 mg/dL — AB (ref 65–99)
GLUCOSE-CAPILLARY: 86 mg/dL (ref 65–99)
Glucose-Capillary: 126 mg/dL — ABNORMAL HIGH (ref 65–99)
Glucose-Capillary: 79 mg/dL (ref 65–99)

## 2015-04-22 LAB — CBC
HCT: 33.5 % — ABNORMAL LOW (ref 36.0–46.0)
Hemoglobin: 10.6 g/dL — ABNORMAL LOW (ref 12.0–15.0)
MCH: 30.7 pg (ref 26.0–34.0)
MCHC: 31.6 g/dL (ref 30.0–36.0)
MCV: 97.1 fL (ref 78.0–100.0)
Platelets: 238 10*3/uL (ref 150–400)
RBC: 3.45 MIL/uL — ABNORMAL LOW (ref 3.87–5.11)
RDW: 14.1 % (ref 11.5–15.5)
WBC: 7.4 10*3/uL (ref 4.0–10.5)

## 2015-04-22 LAB — TSH: TSH: 3.188 u[IU]/mL (ref 0.350–4.500)

## 2015-04-22 MED ORDER — GABAPENTIN 400 MG PO CAPS
800.0000 mg | ORAL_CAPSULE | Freq: Three times a day (TID) | ORAL | Status: DC
  Administered 2015-04-22 – 2015-04-27 (×16): 800 mg via ORAL
  Filled 2015-04-22 (×16): qty 2

## 2015-04-22 MED ORDER — NORTRIPTYLINE HCL 25 MG PO CAPS
25.0000 mg | ORAL_CAPSULE | Freq: Two times a day (BID) | ORAL | Status: DC
  Administered 2015-04-22 – 2015-04-24 (×5): 25 mg via ORAL
  Filled 2015-04-22 (×7): qty 1

## 2015-04-22 MED ORDER — VITAMIN B-12 1000 MCG PO TABS: 1000.0000 ug | ORAL_TABLET | Freq: Every day | ORAL | Status: DC

## 2015-04-22 MED ORDER — PRAVASTATIN SODIUM 40 MG PO TABS
40.0000 mg | ORAL_TABLET | Freq: Every day | ORAL | Status: DC
  Administered 2015-04-22 – 2015-04-26 (×5): 40 mg via ORAL
  Filled 2015-04-22 (×2): qty 1
  Filled 2015-04-22: qty 2
  Filled 2015-04-22 (×3): qty 1

## 2015-04-22 MED ORDER — ADULT MULTIVITAMIN W/MINERALS CH: 1.0000 | ORAL_TABLET | ORAL | Status: DC

## 2015-04-22 MED ORDER — POTASSIUM CHLORIDE 10 MEQ/100ML IV SOLN
10.0000 meq | INTRAVENOUS | Status: AC
  Administered 2015-04-22 (×4): 10 meq via INTRAVENOUS
  Filled 2015-04-22 (×4): qty 100

## 2015-04-22 MED ORDER — VITAMIN B-12 1000 MCG PO TABS
1000.0000 ug | ORAL_TABLET | Freq: Every day | ORAL | Status: DC
  Administered 2015-04-23 – 2015-04-26 (×4): 1000 ug via ORAL
  Filled 2015-04-22 (×5): qty 1

## 2015-04-22 MED ORDER — CLONAZEPAM 1 MG PO TABS
1.0000 mg | ORAL_TABLET | Freq: Four times a day (QID) | ORAL | Status: DC
  Administered 2015-04-22 (×3): 1 mg
  Filled 2015-04-22 (×3): qty 1

## 2015-04-22 MED ORDER — GABAPENTIN 400 MG PO CAPS: 400.0000 mg | ORAL_CAPSULE | Freq: Three times a day (TID) | ORAL | Status: DC

## 2015-04-22 MED ORDER — DULOXETINE HCL 60 MG PO CPEP
60.0000 mg | ORAL_CAPSULE | Freq: Every day | ORAL | Status: DC
  Administered 2015-04-23 – 2015-04-27 (×5): 60 mg via ORAL
  Filled 2015-04-22 (×4): qty 1
  Filled 2015-04-22: qty 2

## 2015-04-22 NOTE — Progress Notes (Signed)
[facility]OC[facility] FOR AM LAB REPLACEMENT ONLY  The patient does apply for the [facility] Electroly[facility] Protocol based on the criteria listed below:   1. Is GFR >/= 40 ml/min? Yes.    Patient's GFR today is >60 2. Is urine output >/= 0.5 ml/kg/hr for the last 6 hours? Yes.   Patient's UOP is 0.53 ml/kg/hr 3. Is BUN < 60 mg/dL? Yes.    Patient's BUN today is 8 4. Abnormal electrolyte  K 3.2 5. Ordered repletion with: per protocol 6. If a panic level lab has been reported, has the CCM MD in charge been notified? Yes.  .   Physician:  Parthenia AmesSimonds  Marieliz Strang McEachran 04/22/2015 4:38 AM

## 2015-04-22 NOTE — Progress Notes (Signed)
PULMONARY / CRITICAL CARE MEDICINE   Name: Brittany [name] MRN: [id number] DOB: April 03, 1965    ADMISSION DATE:  04/20/2015 CONSULTATION DATE:  04/20/15  REFERRING MD:  Pecola Leisure, MD  CHIEF COMPLAINT:  Drug OD  HISTORY OF PRESENT ILLNESS:   50 yr old h/o bipolar dz brought in by EMS with change in MS, lethargic, a notion of info about possible tryclcic OD. Although, given Narcan and woke up with severe agitation. Lost her IV access. Given IM haldol, ativan. Attempted to hurt providers severely in ER.  Intubated and admitted to ICU.     SUBJECTIVE: RN reports pt more calm this am.  Remains on 1.2 of precedex.  Pt tolerated extubation well.  Periods of agitation overnight.  Patient oriented but asking bizarre questions regarding getting married.   VITAL SIGNS: BP 117/73 mmHg  Pulse 59  Temp(Src) 97.7 F (36.5 C) (Core (Comment))  Resp 18  Ht  (1.626 m)  Wt 240 lb 4.8 oz (109 kg)  BMI 41.23 kg/m2  SpO2 100%  HEMODYNAMICS:    VENTILATOR SETTINGS:    INTAKE / OUTPUT: I/O last 3 completed shifts: In: 6786.4 [I.V.:4556.4; NG/GT:30; IV Piggyback:2200] Out: 4435 [Urine:4285; Emesis/NG output:150]  PHYSICAL EXAMINATION: General: obese female in NAD  Neuro:  Awake, alert, oriented to place, self, time but disoriented in conversation HEENT:  Obese, no JVD, ETT Cardiovascular:  s1s2 RRT, distant Lungs:  Even/non-labored on [place], lungs bilaterally diminished but clear   Abdomen:  Soft, obese, NT, ND Musculoskeletal: no acute deformities, min edema Skin: warm/dry  LABS:  BMET  Recent Labs Lab 04/20/15 1827 04/21/15 0626 04/22/15 0318  NA 148* 146* 149*  K 3.3* 3.4* 3.2*  CL 109 115* 109  CO2 BUN CREATININE 0.84 0.73 0.62  GLUCOSE 102* 124* 116*    Electrolytes  Recent Labs Lab 04/20/15 1827 04/21/15 0626 04/22/15 0318  CALCIUM 9.1 7.2* 8.2*  MG  --  1.9  --   PHOS  --  2.5  --     CBC  Recent Labs Lab 04/20/15 1827 04/21/15 0626  04/22/15 0318  WBC 8.0 6.2 7.4  HGB 11.2* 9.5* 10.6*  HCT 35.0* 29.9* 33.5*  PLT 258 194 238    Coag's  Recent Labs Lab 04/20/15 1827  APTT 31  INR 0.97    Sepsis Markers  Recent Labs Lab 04/20/15 1827  LATICACIDVEN 1.6    ABG  Recent Labs Lab 04/20/15 1853  PHART 7.331*  PCO2ART 50.2*  PO2ART 518*    Liver Enzymes  Recent Labs Lab 04/20/15 1827  AST 28  ALT 28  ALKPHOS 80  BILITOT 0.8  ALBUMIN 4.0    Cardiac Enzymes  Recent Labs Lab 04/20/15 1827 04/20/15 2313 04/21/15 0626  TROPONINI <0.03 <0.03 <0.03    Glucose  Recent Labs Lab 04/21/15 0753 04/21/15 1313 04/21/15 1550 04/21/15 1932 04/22/15 0032 04/22/15 0616  GLUCAP 138* 100* 121* 113* 100* 113*    Imaging Dg Chest Port 1 View  04/22/2015  CLINICAL DATA:  Drug overdose, acute respiratory failure EXAM: PORTABLE CHEST 1 VIEW COMPARISON:  Portable chest x-ray of April 21, 2015 FINDINGS: The patient has undergone interval extubation of the trachea and esophagus. The lungs remain borderline hypoinflated. There is persistent bibasilar atelectasis or pneumonia. There is no large pleural effusion and no pneumothorax. The cardiac silhouette is enlarged. The pulmonary vascularity is not engorged. IMPRESSION: Mild bilateral hypoinflation with bibasilar atelectasis or pneumonia. Cardiomegaly without pulmonary vascular  congestion. Electronically Signed   By: David  SwazilandJordan M.D.   On: 04/22/2015 07:19     STUDIES:    CULTURES:   ANTIBIOTICS:   SIGNIFICANT EVENTS: 3/07  Admit with severe psychosis, woke up after narcan   LINES/TUBES: ETT 3/7 >> 3/8  ASSESSMENT / PLAN:  PULMONARY A: Intubated for Airway Protection in the setting of AMS / suspected OD Mild Pulmonary Edema - improved post diuresis  P:   Intermittent CXR Pulmonary hygiene - IS, mobilize   CARDIOVASCULAR A:  ST, see neuro, At risk QRS and qtc prolongation if OD tricyclic P:  NS to KVO Tele monitoring  D/C  bicarb gtt   RENAL A:   At Risk AKI - in setting of OD Hypokalemia  P:   D/c bicarb gtt Trend BMP / UOP  Replace electrolytes as indicated   GASTROINTESTINAL A:   Obesity  P:   PPI for SUP  Trend LFT's Advance diet as tolerated   HEMATOLOGIC A:   DVT prevention P:  Sub q hep Monitor CBC    INFECTIOUS A:   No evidence for acute infectious process, at risk asp P:   Monitor fever curve / WBC  ENDOCRINE A:   Assess for Hypo / Hyperglycemia Hypothyroidism  P:   CBG Q4 SSI  Resume synthroid Assess TSH   NEUROLOGIC A:   OD, likely polysubstance, r/o tricyclic, r/o narcotics with excellent response to narcan.  Tylenol <2, Salicylate <4, osm 308.  UDS positive for amphetamines, benzo's.  ETOH negative.  Bipolar disorder, r/o flare R/o serotonin syndrome P:   RASS goal: n/a Wean precedex gtt to off  Suicide sitter Continue klonopin, increase to home dose Resume neurontin at reduced dose, nortriptyline at reduced dose Hold home cymbalta, nortriptyline, neurontin, flexeril, baclofen   FAMILY  - Updates: No family available.  Steffanie RainwaterFiancee reportedly only family but he has no transportation to come to hospital.    - Inter-disciplinary family meet or Palliative Care meeting due by: 7 days     Canary BrimBrandi Aubrea Meixner, NP-C [place] [facility] Pgr: [phone] or if no answer [phone] 04/22/2015, 10:14 AM

## 2015-04-23 ENCOUNTER — Inpatient Hospital Stay (HOSPITAL_COMMUNITY): Payer: Medicaid Other

## 2015-04-23 DIAGNOSIS — J9601 Acute respiratory failure with hypoxia: Secondary | ICD-10-CM

## 2015-04-23 DIAGNOSIS — J80 Acute respiratory distress syndrome: Secondary | ICD-10-CM

## 2015-04-23 LAB — URINALYSIS, ROUTINE W REFLEX MICROSCOPIC
Bilirubin Urine: NEGATIVE
GLUCOSE, UA: NEGATIVE mg/dL
HGB URINE DIPSTICK: NEGATIVE
Ketones, ur: NEGATIVE mg/dL
Nitrite: NEGATIVE
PROTEIN: NEGATIVE mg/dL
Specific Gravity, Urine: 1.006 (ref 1.005–1.030)
pH: 7.5 (ref 5.0–8.0)

## 2015-04-23 LAB — GLUCOSE, CAPILLARY
GLUCOSE-CAPILLARY: 78 mg/dL (ref 65–99)
GLUCOSE-CAPILLARY: 80 mg/dL (ref 65–99)
Glucose-Capillary: 109 mg/dL — ABNORMAL HIGH (ref 65–99)
Glucose-Capillary: 112 mg/dL — ABNORMAL HIGH (ref 65–99)
Glucose-Capillary: 43 mg/dL — CL (ref 65–99)
Glucose-Capillary: 55 mg/dL — ABNORMAL LOW (ref 65–99)
Glucose-Capillary: 57 mg/dL — ABNORMAL LOW (ref 65–99)
Glucose-Capillary: 61 mg/dL — ABNORMAL LOW (ref 65–99)
Glucose-Capillary: 69 mg/dL (ref 65–99)
Glucose-Capillary: 78 mg/dL (ref 65–99)
Glucose-Capillary: 78 mg/dL (ref 65–99)
Glucose-Capillary: 91 mg/dL (ref 65–99)
Glucose-Capillary: 96 mg/dL (ref 65–99)

## 2015-04-23 LAB — URINE MICROSCOPIC-ADD ON: RBC / HPF: NONE SEEN RBC/hpf (ref 0–5)

## 2015-04-23 LAB — ECHOCARDIOGRAM COMPLETE
Height: 64 in
Weight: 3890.68 [oz_av]

## 2015-04-23 LAB — TROPONIN I: Troponin I: <0.03 ng/mL (ref <0.031)

## 2015-04-23 MED ORDER — POTASSIUM CHLORIDE CRYS ER 20 MEQ PO TBCR
40.0000 meq | EXTENDED_RELEASE_TABLET | Freq: Once | ORAL | Status: AC
  Administered 2015-04-23: 40 meq via ORAL
  Filled 2015-04-23: qty 2

## 2015-04-23 MED ORDER — CLONAZEPAM 1 MG PO TABS
1.0000 mg | ORAL_TABLET | Freq: Four times a day (QID) | ORAL | Status: DC
  Administered 2015-04-23 – 2015-04-27 (×18): 1 mg via ORAL
  Filled 2015-04-23 (×19): qty 1

## 2015-04-23 MED ORDER — LEVOTHYROXINE SODIUM 100 MCG PO TABS
100.0000 ug | ORAL_TABLET | Freq: Every day | ORAL | Status: DC
  Administered 2015-04-24 – 2015-04-27 (×4): 100 ug via ORAL
  Filled 2015-04-23 (×4): qty 1

## 2015-04-23 MED ORDER — ACETAMINOPHEN 325 MG PO TABS
650.0000 mg | ORAL_TABLET | Freq: Four times a day (QID) | ORAL | Status: DC | PRN
  Administered 2015-04-23 – 2015-04-27 (×3): 650 mg via ORAL
  Filled 2015-04-23 (×3): qty 2

## 2015-04-23 MED ORDER — FUROSEMIDE 10 MG/ML IJ SOLN
20.0000 mg | Freq: Every day | INTRAMUSCULAR | Status: DC
  Administered 2015-04-23 – 2015-04-24 (×2): 20 mg via INTRAVENOUS
  Filled 2015-04-23 (×2): qty 2

## 2015-04-23 MED ORDER — DEXTROSE 50 % IV SOLN
INTRAVENOUS | Status: AC
  Administered 2015-04-23: 50 mL
  Filled 2015-04-23: qty 50

## 2015-04-23 MED ORDER — ALUM & MAG HYDROXIDE-SIMETH 200-200-20 MG/5ML PO SUSP
30.0000 mL | Freq: Four times a day (QID) | ORAL | Status: DC | PRN
  Administered 2015-04-23 – 2015-04-27 (×2): 30 mL via ORAL
  Filled 2015-04-23 (×2): qty 30

## 2015-04-23 MED ORDER — DEXTROSE 5 % IV SOLN
INTRAVENOUS | Status: DC
  Administered 2015-04-23 – 2015-04-24 (×2): via INTRAVENOUS

## 2015-04-23 MED ORDER — NITROGLYCERIN 0.4 MG SL SUBL
SUBLINGUAL_TABLET | SUBLINGUAL | Status: AC
  Filled 2015-04-23: qty 1

## 2015-04-23 NOTE — Progress Notes (Signed)
Pt states lavender aromatherapy helped decrease her anxiety "a lot".  Delford FieldGagliano, Brittany Ahlin E, RN

## 2015-04-23 NOTE — Progress Notes (Signed)
Pt c/o feeling anxious. 2 drops lavender eo placed on gauze in paper med cup and placed at bedside for aromatherapy. Emotional support given.  Delford FieldGagliano, Sigmund Morera E, RN

## 2015-04-23 NOTE — Progress Notes (Signed)
Echocardiogram 2D Echocardiogram has been performed.  Nolon RodBrown, Tony 04/23/2015, 2:25 PM

## 2015-04-23 NOTE — Progress Notes (Signed)
eLink Physician-Brief Progress Note Patient Name: Brittany [name] DOB: 07-14-1965 MRN: [id number]   Date of Service  04/23/2015  HPI/Events of Note  C/O of headache  eICU Interventions  PRN tylenol     Intervention Category Intermediate Interventions: Pain - evaluation and management  Susano Cleckler 04/23/2015, 9:21 PM

## 2015-04-23 NOTE — Progress Notes (Addendum)
Called by RN regarding patient complaints of chest pain.  Patient rates 10/10, epigastric pain and chest pain.  EKG assessed and within normal limits. No other associated symptoms.  Pt tolerating diet well.  No diaphoresis.  VSS.    Filed Vitals:   04/23/15 1000 04/23/15 1105  BP: 131/87 137/89  Pulse:  93  Temp:  97.7 F (36.5 C)  Resp: 11 16    Chest / Epigastric Pain - suspect element of discomfort from prior extubation  Plan: Mylanta now for epigastric discomfort Assess Troponin x1 EKG reviewed, qtc wnl Monitor clinically  Await ECHO  Canary BrimBrandi Skippy Marhefka, NP-C [place] [facility] Pgr: [phone] or if no answer [phone] 04/23/2015, 12:34 PM

## 2015-04-23 NOTE — Evaluation (Signed)
Physical Therapy Evaluation Patient Details Name: Brittany [name] MRN: [id number] DOB: [birth year] Today's Date: 04/23/2015   History of Present Illness  Patient is a 50 year old female with a past medical history of hypertension, hypothyroidism, depression, bipolar disorder, anxiety, and history of IV drug use presenting with acute encephalopathy and acute respiratory failure 2* overdose. Intubated 04/20/15.  Extubated 04/21/15.  Clinical Impression  Pt admitted as above and presenting with functional mobility limitations 2* ambulatory balance deficits and questionable safety awareness.  Pt should progress to dc home with 24/7 sup/assist of fiance.    Follow Up Recommendations No PT follow up    Equipment Recommendations  None recommended by PT    Recommendations for Other Services OT consult     Precautions / Restrictions Precautions Precautions: Fall Restrictions Weight Bearing Restrictions: No      Mobility  Bed Mobility Overal bed mobility: Modified Independent             General bed mobility comments: Pt to EOB unassisted  Transfers Overall transfer level: Needs assistance Equipment used: None Transfers: Sit to/from Stand Sit to Stand: Min guard         General transfer comment: Cues for safe transition position and use of UEs to self assist  Ambulation/Gait Ambulation/Gait assistance: Min guard;Min assist Ambulation Distance (Feet): 700 Feet Assistive device: Rolling walker (2 wheeled);1 person hand held assist;None Gait Pattern/deviations: Step-through pattern;Decreased step length - right;Decreased step length - left;Shuffle;Wide base of support Gait velocity: mod pace Gait velocity interpretation: Below normal speed for age/gender General Gait Details: Pt ambulated 150' with RW, transitioned to 250' min HHA and progressed to ambulate 400' with min guard and vc for saftey awareness  Stairs            Wheelchair Mobility    Modified Rankin  (Stroke Patients Only)       Balance Overall balance assessment: Needs assistance Sitting-balance support: No upper extremity supported;Feet supported Sitting balance-Leahy Scale: Good     Standing balance support: No upper extremity supported Standing balance-Leahy Scale: Fair                               Pertinent Vitals/Pain      Home Living Family/patient expects to be discharged to:: Private residence Living Arrangements: Spouse/significant other Available Help at Discharge: Friend(s);Family;Available 24 hours/day Type of Home: House Home Access: Stairs to enter Entrance Stairs-Rails: Right Entrance Stairs-Number of Steps: 4 Home Layout: One level Home Equipment: Shower seat      Prior Function Level of Independence: Independent               Hand Dominance   Dominant Hand: Right    Extremity/Trunk Assessment   Upper Extremity Assessment: Overall WFL for tasks assessed           Lower Extremity Assessment: Overall WFL for tasks assessed      Cervical / Trunk Assessment: Normal  Communication   Communication: No difficulties  Cognition Arousal/Alertness: Awake/alert Behavior During Therapy: Flat affect;Impulsive Overall Cognitive Status: Impaired/Different from baseline Area of Impairment: Safety/judgement;Problem solving         Safety/Judgement: Decreased awareness of safety   Problem Solving: Slow processing      General Comments      Exercises        Assessment/Plan    PT Assessment Patient needs continued PT services  PT Diagnosis Difficulty walking   PT Problem List Decreased balance;Decreased mobility;Decreased cognition;Decreased  knowledge of precautions  PT Treatment Interventions DME instruction;Gait training;Stair training;Therapeutic exercise;Balance training;Therapeutic activities;Functional mobility training;Cognitive remediation;Patient/family education   PT Goals (Current goals can be found in the  Care Plan section) Acute Rehab PT Goals Patient Stated Goal: Go home PT Goal Formulation: With patient Time For Goal Achievement: 04/30/15 Potential to Achieve Goals: Good    Frequency Min 3X/week   Barriers to discharge        Co-evaluation               End of Session Equipment Utilized During Treatment: Gait belt Activity Tolerance: Patient tolerated treatment well Patient left: in chair;with call bell/phone within reach;with chair alarm set;with family/visitor present;with nursing/sitter in room Nurse Communication: Mobility status         Time: 1410-1435 PT Time Calculation (min) (ACUTE ONLY): 25 min   Charges:   PT Evaluation $PT Eval Low Complexity: 1 Procedure PT Treatments $Gait Training: 8-22 mins   PT G Codes:        Brittany [name] 04/23/2015, 6:13 PM

## 2015-04-23 NOTE — Progress Notes (Signed)
eLink Physician-Brief Progress Note Patient Name: Brittany [name] DOB: 06-07-1965 MRN: [id number]   Date of Service  04/23/2015  HPI/Events of Note  Asymptomatic hypoglycemia with blood sugar of 55.  eICU Interventions  Plan: Start D5W at 30 cc/hr     Intervention Category Intermediate Interventions: Other:  DETERDING,ELIZABETH 04/23/2015, 4:11 AM

## 2015-04-23 NOTE — Progress Notes (Signed)
PULMONARY / CRITICAL CARE MEDICINE   Name: Brittany [name] MRN: [id number] DOB: 07/17/1965    ADMISSION DATE:  04/20/2015 CONSULTATION DATE:  04/20/15  REFERRING MD:  Pecola Leisureeese, MD  CHIEF COMPLAINT:  Drug OD  HISTORY OF PRESENT ILLNESS:   50 yr old h/o bipolar dz brought in by EMS with change in MS, lethargic, a notion of info about possible tryclcic OD. Although, given Narcan and woke up with severe agitation. Lost her IV access. Given IM haldol, ativan. Attempted to hurt providers severely in ER.  Intubated and admitted to ICU.     SUBJECTIVE:  Off precedex.  Pt reports she was angry with her fiancee when she took medications.  Hypoglycemia overnight requiring D5W administration.  Pt c/o burning with urination.    VITAL SIGNS: BP 135/80 mmHg  Pulse 59  Temp(Src) 97.5 F (36.4 C) (Oral)  Resp 19  Ht 5\' 4"  (1.626 m)  Wt 243 lb 2.7 oz (110.3 kg)  BMI 41.72 kg/m2  SpO2 99%  HEMODYNAMICS:    VENTILATOR SETTINGS:    INTAKE / OUTPUT: I/O last 3 completed shifts: In: 3947.2 [P.O.:1360; I.V.:2287.2; IV Piggyback:300] Out: 3625 [Urine:3625]  PHYSICAL EXAMINATION: General: obese female in NAD lying in bed Neuro:  Awake/alert, MAE HEENT:  Obese, no JVD, ETT Cardiovascular:  s1s2 RRT, distant Lungs:  Even/non-labored on [place], lungs bilaterally diminished but clear   Abdomen:  Soft, obese, NT, ND Musculoskeletal: no acute deformities, min edema Skin: warm/dry  LABS:  BMET  Recent Labs Lab 04/20/15 1827 04/21/15 0626 04/22/15 0318  NA 148* 146* 149*  K 3.3* 3.4* 3.2*  CL 109 115* 109  CO2 26 25 31   BUN 13 11 8   CREATININE 0.84 0.73 0.62  GLUCOSE 102* 124* 116*    Electrolytes  Recent Labs Lab 04/20/15 1827 04/21/15 0626 04/22/15 0318  CALCIUM 9.1 7.2* 8.2*  MG  --  1.9  --   PHOS  --  2.5  --     CBC  Recent Labs Lab 04/20/15 1827 04/21/15 0626 04/22/15 0318  WBC 8.0 6.2 7.4  HGB 11.2* 9.5* 10.6*  HCT 35.0* 29.9* 33.5*  PLT 258 194 238     Coag's  Recent Labs Lab 04/20/15 1827  APTT 31  INR 0.97    Sepsis Markers  Recent Labs Lab 04/20/15 1827  LATICACIDVEN 1.6    ABG  Recent Labs Lab 04/20/15 1853  PHART 7.331*  PCO2ART 50.2*  PO2ART 518*    Liver Enzymes  Recent Labs Lab 04/20/15 1827  AST 28  ALT 28  ALKPHOS 80  BILITOT 0.8  ALBUMIN 4.0    Cardiac Enzymes  Recent Labs Lab 04/20/15 1827 04/20/15 2313 04/21/15 0626  TROPONINI <0.03 <0.03 <0.03    Glucose  Recent Labs Lab 04/22/15 2125 04/23/15 0039 04/23/15 0123 04/23/15 0205 04/23/15 0353 04/23/15 0637  GLUCAP 79 57* 69 78 55* 109*    Imaging No results found.   STUDIES:    CULTURES:   ANTIBIOTICS:   SIGNIFICANT EVENTS: 3/07  Admit with severe psychosis, woke up after narcan   LINES/TUBES: ETT 3/7 >> 3/8  ASSESSMENT / PLAN:  PULMONARY A: Intubated for Airway Protection in the setting of AMS / suspected OD Mild Pulmonary Edema - improved post diuresis  P:   Intermittent CXR Pulmonary hygiene - IS, mobilize  CARDIOVASCULAR A:  ST, see neuro, At risk QRS and qtc prolongation if OD tricyclic P:  [facility], transfer to medical floor  Assess EKG  in am with restart of home PSY medications   RENAL A:   At Risk AKI - in setting of OD Hypokalemia  P:   Trend BMP / UOP  Replace electrolytes as indicated   GASTROINTESTINAL A:   Obesity  P:   PPI for SUP, d/c 3/10 Trend LFT's Advance diet as tolerated   HEMATOLOGIC A:   DVT prevention Anemia  P:  Sub q hep Monitor CBC    INFECTIOUS A:   No evidence for acute infectious process, at risk asp Dysuria  P:   Monitor fever curve / WBC Assess UA   ENDOCRINE A:   Assess for Hypo / Hyperglycemia Hypothyroidism  - TSH 3.188 P:   CBG Q4 SSI  Continue synthroid  NEUROLOGIC A:   OD, likely polysubstance, r/o tricyclic, r/o narcotics with excellent response to narcan.  Tylenol <2, Salicylate <4, osm 308.  UDS  positive for amphetamines, benzo's.  ETOH negative.  Bipolar disorder, r/o flare R/o serotonin syndrome P:   RASS goal: n/a Wean precedex gtt to off  Suicide sitter Continue klonopin, increase to home dose Resume neurontin at reduced dose, nortriptyline at reduced dose, cymbalta, baclofen, neurontin PSY consult    FAMILY  - Updates: No family available.  Steffanie Rainwater reportedly only family but he has no transportation to come to hospital.    - Inter-disciplinary family meet or Palliative Care meeting due by: 7 days   Transfer to medical floor.  PSY consult pending.  To [facility] in am 3/11 0700   Canary Brim, NP-C  Pulmonary & Critical Care Pgr: [phone] or if no answer [phone] 04/23/2015, 9:16 AM

## 2015-04-24 DIAGNOSIS — T50902D Poisoning by unspecified drugs, medicaments and biological substances, intentional self-harm, subsequent encounter: Secondary | ICD-10-CM

## 2015-04-24 DIAGNOSIS — K219 Gastro-esophageal reflux disease without esophagitis: Secondary | ICD-10-CM

## 2015-04-24 DIAGNOSIS — F315 Bipolar disorder, current episode depressed, severe, with psychotic features: Secondary | ICD-10-CM

## 2015-04-24 LAB — BASIC METABOLIC PANEL
Anion gap: 10 (ref 5–15)
BUN: 10 mg/dL (ref 6–20)
CHLORIDE: 106 mmol/L (ref 101–111)
CO2: 25 mmol/L (ref 22–32)
Calcium: 9.4 mg/dL (ref 8.9–10.3)
Creatinine, Ser: 0.63 mg/dL (ref 0.44–1.00)
GFR calc Af Amer: >60 mL/min (ref >60)
GFR calc non Af Amer: >60 mL/min (ref >60)
GLUCOSE: 104 mg/dL — AB (ref 65–99)
POTASSIUM: 3.7 mmol/L (ref 3.5–5.1)
Sodium: 141 mmol/L (ref 135–145)

## 2015-04-24 LAB — GLUCOSE, CAPILLARY
GLUCOSE-CAPILLARY: 104 mg/dL — AB (ref 65–99)
Glucose-Capillary: 105 mg/dL — ABNORMAL HIGH (ref 65–99)
Glucose-Capillary: 114 mg/dL — ABNORMAL HIGH (ref 65–99)
Glucose-Capillary: 89 mg/dL (ref 65–99)
Glucose-Capillary: 104 mg/dL — ABNORMAL HIGH (ref 65–99)

## 2015-04-24 LAB — CBC
HEMATOCRIT: 33.7 % — AB (ref 36.0–46.0)
HEMOGLOBIN: 11.1 g/dL — AB (ref 12.0–15.0)
MCH: 30.2 pg (ref 26.0–34.0)
MCHC: 32.9 g/dL (ref 30.0–36.0)
MCV: 91.6 fL (ref 78.0–100.0)
Platelets: 264 10*3/uL (ref 150–400)
RBC: 3.68 MIL/uL — ABNORMAL LOW (ref 3.87–5.11)
RDW: 14 % (ref 11.5–15.5)
WBC: 7 10*3/uL (ref 4.0–10.5)

## 2015-04-24 LAB — PHOSPHORUS: Phosphorus: 4.9 mg/dL — ABNORMAL HIGH (ref 2.5–4.6)

## 2015-04-24 LAB — TROPONIN I: Troponin I: <0.03 ng/mL (ref <0.031)

## 2015-04-24 LAB — MAGNESIUM: Magnesium: 2 mg/dL (ref 1.7–2.4)

## 2015-04-24 MED ORDER — INSULIN ASPART 100 UNIT/ML ~~LOC~~ SOLN: 0.0000 [IU] | Freq: Every day | SUBCUTANEOUS | Status: DC

## 2015-04-24 MED ORDER — LORAZEPAM 1 MG PO TABS
2.0000 mg | ORAL_TABLET | Freq: Three times a day (TID) | ORAL | Status: DC
  Administered 2015-04-24 – 2015-04-26 (×7): 2 mg via ORAL
  Filled 2015-04-24 (×7): qty 2

## 2015-04-24 MED ORDER — INSULIN ASPART 100 UNIT/ML ~~LOC~~ SOLN: 0.0000 [IU] | Freq: Three times a day (TID) | SUBCUTANEOUS | Status: DC

## 2015-04-24 MED ORDER — FUROSEMIDE 20 MG PO TABS
20.0000 mg | ORAL_TABLET | Freq: Two times a day (BID) | ORAL | Status: DC
  Administered 2015-04-24 – 2015-04-27 (×7): 20 mg via ORAL
  Filled 2015-04-24 (×7): qty 1

## 2015-04-24 MED ORDER — PANTOPRAZOLE SODIUM 40 MG PO TBEC
40.0000 mg | DELAYED_RELEASE_TABLET | Freq: Two times a day (BID) | ORAL | Status: DC
  Administered 2015-04-24 – 2015-04-27 (×3): 40 mg via ORAL
  Filled 2015-04-24 (×4): qty 1

## 2015-04-24 NOTE — Progress Notes (Signed)
[facility] hospitalist progress note. Chief complaint. Chest pain. History of present illness. This 50 year old female in hospital with acute respiratory failure. She had noted QTC prolongation on admission concerning for tricyclic overdose. Patient complained to nursing of central chest pain. A 12-lead EKG was obtained and I reviewed this. The EKG does not appear acutely ischemic. I came to see the patient at bedside and she claims to have 10/10 central chest pain. Prior to entering the room I noted the patient eating a container of ice cream. Vital signs. Temperature 97.8, pulse 89, respiration 18, blood pressure 133/86. O2 sats 97%. General appearance. Obese female who is alert and in no distress. Cardiac rate and rhythm regular. Lungs. Reduced but clear. Abdomen. Soft and obese with positive bowel sounds. Impression/plan. Problem #1. Chest pain. I see no prior history of coronary artery disease. I suspect patient may be drug-seeking at this point. EKG appears benign without evidence of acute ischemia. Nonetheless I will check troponin now on an every 6 hours for total of 3 sets. We'll repeat an EKG in approximately 6 hours to evaluate for any changes. We'll follow for the troponin results.

## 2015-04-24 NOTE — Care Management Note (Addendum)
Case Management Note  Patient Details  Name: Brittany [name] MRN: [id number] Date of Birth: [birth year]  Subjective/Objective:      AMS / suspected OD              Action/Plan: Late entry 1315 NCM spoke to pt at bedside. States she wanted to leave hospital and sign out AMA. NCM explained that she is under the care of hospital physicians and at this time for her safety best plan is to remain in hospital for care until final recommendation are made for her discharge. Pt states her fiance would call in the room and she has multiple issues at home that need her attention. States her gas was getting ready to be cut off. Explained she would have to discuss with family to assist her while in hospital with getting bill paid. Pt states she understands she will have to stay until medically stable. Will comply with care as established by her attending physicians. Pt states "I do not plan to harm myself."  Pt spoke to her Unit RN and made her aware she would comply with care.  Attending notified. Pt in room with Safety Sitter.   Expected Discharge Date:   (unknown)               [facility]  In-House Referral:  Clinical Social Work  Discharge planning Services  CM Consult  Post Acute Care Choice:  NA Choice offered to:  NA  DME Arranged:  N/A DME Agency:  NA  HH Arranged:  NA HH Agency:  NA  Status of Service:  Completed, signed off  Medicare Important Message Given:    Date Medicare IM Given:    Medicare IM give by:    Date Additional Medicare IM Given:    Additional Medicare Important Message give by:     If discussed at Long Length of Stay Meetings, dates discussed:    Additional Comments:  Elliot CousinShavis, Maeola Mchaney Ellen, RN 04/24/2015, 7:29 PM

## 2015-04-24 NOTE — Progress Notes (Signed)
Pt requesting to shower. Sitter at bedside. Verbal order obtained from Dr. Gwenlyn PerkingMadera via telephone. Central Tele noified to put pt on standby.  Delford FieldGagliano, Jenette Rayson E, RN

## 2015-04-24 NOTE — Progress Notes (Signed)
Patient c/o right and left chest pain. Pain was rated 10/10. Patient asked RN to get an order for Fentanyl. Patient was sitting on the bench,crosslegged when she told RN about chest pain.  PCP on call was notified. 12 lead EKG was performed.

## 2015-04-24 NOTE — Progress Notes (Signed)
[facility] PROGRESS NOTE  Brittany [name] AVW:[id number] DOB: 09-29-1965 DOA: 04/20/2015 PCP: Evelene [name], MD  Assessment/Plan: 1-acute resp failure: requiring intubation for airway protection. Also found to have pulmonary edema -now extubated and stable -Good O2 sat on RA -will continue pulmonary hygiene -continue IS -continue lasix   2-QtC prolongation -with concerns for tricyclic overdose  -will continue monitoring on telemetry  3-Hypokalemia  -continue repletion as needed -diuresis contributing   4-GERD -will continue PPI  5-Obesity -low calorie diet discussed with patient -Body mass index is 41.09 kg/(m^2).  6-hypothyroidism  -continue synthroid   7-DVT prophylaxis  -continue lovenox  8-bipolar disorder: -continue ativan and klonopin  -psych recommending to hold on [place] -needs inpatient   9-HLD: -continue statins  Code Status: Full Family Communication: fiancee at bedside  Disposition Plan: Needs inpatient psychiatry treatment; remains inpatient    Consultants:  Psych  PCCM  Procedures:  See below for x-ray reports   Patient required intubation from 3/7>>3/8  2-D echo: 3/10 - Left ventricle: The cavity size was normal. There was mild focal  basal hypertrophy of the septum. Systolic function was normal.  The estimated ejection fraction was in the range of 60% to 65%.  Wall motion was normal; there were no regional wall motion  abnormalities. Doppler parameters are consistent with abnormal  left ventricular relaxation (grade 1 diastolic dysfunction).  There was no evidence of elevated ventricular filling pressure by  Doppler parameters. - Aortic valve: Trileaflet; normal thickness leaflets. There was no  regurgitation. - Aortic root: The aortic root was normal in size. - Mitral valve: Structurally normal valve. There was no  regurgitation. - Left atrium: The atrium was mildly dilated. - Right ventricle: The cavity  size was normal. Wall thickness was  normal. Systolic function was normal. - Right atrium: The atrium was normal in size. - Tricuspid valve: There was mild regurgitation. - Pulmonic valve: There was no regurgitation. - Pulmonary arteries: Systolic pressure was within the normal  range. - Inferior vena cava: The vessel was normal in size. - Pericardium, extracardiac: There was no pericardial effusion.  Antibiotics:  None   HPI/Subjective: Afebrile, no CP, no SOB. Patient denies hallucinations, SI and homicidal ideas   Objective: Filed Vitals:   04/23/15 2031 04/24/15 0422  BP: 132/87 117/72  Pulse: 99 88  Temp: 98.2 F (36.8 C) 98.3 F (36.8 C)  Resp: 18 18    Intake/Output Summary (Last 24 hours) at 04/24/15 1540 Last data filed at 04/24/15 1106  Gross per 24 hour  Intake   1127 ml  Output   2850 ml  Net  -1723 ml   Filed Weights   04/22/15 0500 04/23/15 0500 04/24/15 0500  Weight: 109 kg (240 lb 4.8 oz) 110.3 kg (243 lb 2.7 oz) 108.636 kg (239 lb 8 oz)    Exam:   General:  Afebrile, no CP, no SOB. Denies SI, hallucinations and homicidal ideation   Cardiovascular: S1 and S2, no rubs or gallops  Respiratory: good air movement, no wheezing   Abdomen: obese, soft, NT, ND, positive BS  Musculoskeletal:edema bilaterally (1+) lower extremities mainly; some trace edema appreciated on her hands as well   Data Reviewed: Basic Metabolic Panel:  Recent Labs Lab 04/20/15 1827 04/21/15 0626 04/22/15 0318 04/24/15 0543  NA 148* 146* 149* 141  K 3.3* 3.4* 3.2* 3.7  CL 109 115* 109 106  CO2 GLUCOSE 102* 124* 116* 104*  BUN CREATININE 0.84  0.73 0.62 0.63  CALCIUM 9.1 7.2* 8.2* 9.4  MG  --  1.9  --  2.0  PHOS  --  2.5  --  4.9*   Liver Function Tests:  Recent Labs Lab 04/20/15 1827  AST 28  ALT 28  ALKPHOS 80  BILITOT 0.8  PROT 7.2  ALBUMIN 4.0    Recent Labs Lab 04/20/15 1827  LIPASE 26  AMYLASE 50   CBC:  Recent  Labs Lab 04/20/15 1827 04/21/15 0626 04/22/15 0318 04/24/15 0543  WBC 8.0 6.2 7.4 7.0  NEUTROABS 5.8  --   --   --   HGB 11.2* 9.5* 10.6* 11.1*  HCT 35.0* 29.9* 33.5* 33.7*  MCV 97.0 96.8 97.1 91.6  PLT 258 194 238 264   Cardiac Enzymes:  Recent Labs Lab 04/20/15 1827 04/20/15 2313 04/21/15 0626 04/23/15 2103  TROPONINI <0.03 <0.03 <0.03 <0.03    CBG:  Recent Labs Lab 04/23/15 2026 04/23/15 2354 04/24/15 0347 04/24/15 0729 04/24/15 1059  GLUCAP 78 112* 105* 114* 104*    Recent Results (from the past 240 hour(s))  MRSA PCR Screening     Status: None   Collection Time: 04/20/15 10:41 PM  Result Value Ref Range Status   MRSA by PCR NEGATIVE NEGATIVE Final    Comment:        The GeneXpert MRSA Assay (FDA approved for NASAL specimens only), is one component of a comprehensive MRSA colonization surveillance program. It is not intended to diagnose MRSA infection nor to guide or monitor treatment for MRSA infections.      Studies: No results found.  Scheduled Meds: . antiseptic oral rinse  7 mL Mouth Rinse q12n4p  . baclofen  10 mg Oral TID  . chlorhexidine  15 mL Mouth Rinse BID  . clonazePAM  1 mg Oral QID  . DULoxetine  60 mg Oral Daily  . folic acid  1 mg Oral Daily  . furosemide  20 mg Oral BID  . gabapentin  800 mg Oral TID  . heparin  5,000 Units Subcutaneous 3 times per day  . insulin aspart  0-9 Units Subcutaneous 6 times per day  . levothyroxine  100 mcg Oral QAC breakfast  . LORazepam  2 mg Oral 3 times per day  . multivitamin with minerals  1 tablet Oral Daily  . potassium chloride  40 mEq Oral Once  . pravastatin  40 mg Oral q1800  . thiamine  100 mg Oral Daily  . vitamin B-12  1,000 mcg Oral Daily   Continuous Infusions: . sodium chloride 10 mL/hr at 04/22/15 1113  . dextrose 30 mL/hr at 04/24/15 [id number]    Principal Problem:   Drug overdose Active Problems:   Bipolar affective (HCC)   Acute respiratory failure with hypoxia  (HCC)    Time spent: 35 minutes    Vassie LollMadera, Keeghan Bialy  [facility] Pager [phone]. If 7PM-7AM, please contact night-coverage at [email or web address], password [email or web address] 04/24/2015, 3:40 PM  LOS: 4 days
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
## 2015-04-25 DIAGNOSIS — F3160 Bipolar disorder, current episode mixed, unspecified: Secondary | ICD-10-CM

## 2015-04-25 DIAGNOSIS — G43009 Migraine without aura, not intractable, without status migrainosus: Secondary | ICD-10-CM

## 2015-04-25 LAB — PHOSPHORUS: PHOSPHORUS: 4.1 mg/dL (ref 2.5–4.6)

## 2015-04-25 LAB — BASIC METABOLIC PANEL
ANION GAP: 13 (ref 5–15)
BUN: 13 mg/dL (ref 6–20)
CO2: 24 mmol/L (ref 22–32)
Calcium: 9.2 mg/dL (ref 8.9–10.3)
Chloride: 105 mmol/L (ref 101–111)
Creatinine, Ser: 0.68 mg/dL (ref 0.44–1.00)
GLUCOSE: 123 mg/dL — AB (ref 65–99)
POTASSIUM: 3.6 mmol/L (ref 3.5–5.1)
Sodium: 142 mmol/L (ref 135–145)

## 2015-04-25 LAB — GLUCOSE, CAPILLARY
GLUCOSE-CAPILLARY: 104 mg/dL — AB (ref 65–99)
GLUCOSE-CAPILLARY: 86 mg/dL (ref 65–99)
Glucose-Capillary: 90 mg/dL (ref 65–99)
Glucose-Capillary: 105 mg/dL — ABNORMAL HIGH (ref 65–99)

## 2015-04-25 LAB — TROPONIN I: Troponin I: <0.03 ng/mL (ref <0.031)

## 2015-04-25 LAB — MAGNESIUM: Magnesium: 2 mg/dL (ref 1.7–2.4)

## 2015-04-25 MED ORDER — VITAMINS A & D EX OINT
TOPICAL_OINTMENT | CUTANEOUS | Status: AC
  Administered 2015-04-25: 5
  Filled 2015-04-25: qty 5

## 2015-04-25 MED ORDER — BUTALBITAL-APAP-CAFFEINE 50-325-40 MG PO TABS
1.0000 | ORAL_TABLET | ORAL | Status: DC | PRN
  Administered 2015-04-25 – 2015-04-27 (×11): 1 via ORAL
  Filled 2015-04-25 (×11): qty 1

## 2015-04-25 NOTE — Progress Notes (Signed)
[facility] PROGRESS NOTE  Brittany [name] ZOX:[id number] DOB: 1965/04/09 DOA: 04/20/2015 PCP: [name], [name], MD  Assessment/Plan: 1-acute resp failure: requiring intubation for airway protection. Also found to have pulmonary edema -now extubated and stable; no resp distress  -Good O2 sat on RA -will continue pulmonary hygiene -continue IS -continue lasix   2-QtC prolongation -with concerns for tricyclic overdose  -resolve dand stable on telemetry -in order to minimize agitation and given normal electrolytes now. Will d/c cardiac monitoring   3-Hypokalemia  -continue repletion as needed -diuresis contributing   4-GERD -will continue PPI  5-Obesity -low calorie diet discussed with patient -Body mass index is 40.61 kg/(m^2).  6-hypothyroidism  -continue synthroid   7-DVT prophylaxis  -continue lovenox  8-bipolar disorder: -continue ativan and klonopin  -psych recommending to hold on Pamelor -needs inpatient treatment   9-HLD: -continue statins  10-migraine : -will use PRN fioricet   Code Status: Full Family Communication: fiancee at bedside  Disposition Plan: Needs inpatient psychiatry treatment; remains inpatient    Consultants:  Psych  PCCM  Procedures:  See below for x-ray reports   Patient required intubation from 3/7>>3/8  2-D echo: 3/10 - Left ventricle: The cavity size was normal. There was mild focal  basal hypertrophy of the septum. Systolic function was normal.  The estimated ejection fraction was in the range of 60% to 65%.  Wall motion was normal; there were no regional wall motion  abnormalities. Doppler parameters are consistent with abnormal  left ventricular relaxation (grade 1 diastolic dysfunction).  There was no evidence of elevated ventricular filling pressure by  Doppler parameters. - Aortic valve: Trileaflet; normal thickness leaflets. There was no  regurgitation. - Aortic root: The aortic root was normal  in size. - Mitral valve: Structurally normal valve. There was no  regurgitation. - Left atrium: The atrium was mildly dilated. - Right ventricle: The cavity size was normal. Wall thickness was  normal. Systolic function was normal. - Right atrium: The atrium was normal in size. - Tricuspid valve: There was mild regurgitation. - Pulmonic valve: There was no regurgitation. - Pulmonary arteries: Systolic pressure was within the normal  range. - Inferior vena cava: The vessel was normal in size. - Pericardium, extracardiac: There was no pericardial effusion.  Antibiotics:  None   HPI/Subjective: Afebrile, no SOB. Patient denies hallucinations, SI and homicidal ideas. Overnight complaining of CP, no abnormalities on EKG, troponin or telemetry.  Objective: Filed Vitals:   04/24/15 2137 04/25/15 0442  BP: 133/86 137/87  Pulse: 89 96  Temp: 97.8 F (36.6 C) 97.8 F (36.6 C)  Resp:  18   No intake or output data in the 24 hours ending 04/25/15 1236 Filed Weights   04/23/15 0500 04/24/15 0500 04/25/15 0500  Weight: 110.3 kg (243 lb 2.7 oz) 108.636 kg (239 lb 8 oz) 107.366 kg (236 lb 11.2 oz)    Exam:   General:  Afebrile, no CP, no SOB. Denies SI, hallucinations and homicidal ideation currently. She is IVC and waiting for bed availability at psych facility.  Cardiovascular: S1 and S2, no rubs or gallops  Respiratory: good air movement, no wheezing   Abdomen: obese, soft, NT, ND, positive BS  Musculoskeletal:edema bilaterally (1+) lower extremities mainly; edema on her hands resolved  Data Reviewed: Basic Metabolic Panel:  Recent Labs Lab 04/20/15 1827 04/21/15 0626 04/22/15 0318 04/24/15 0543 04/25/15 0546  NA 148* 146* 149* 141 142  K 3.3* 3.4* 3.2* 3.7 3.6  CL 109 115* 109 106 105  CO2 GLUCOSE 102* 124* 116* 104* 123*  BUN CREATININE 0.84 0.73 0.62 0.63 0.68  CALCIUM 9.1 7.2* 8.2* 9.4 9.2  MG  --  1.9  --  2.0 2.0  PHOS   --  2.5  --  4.9* 4.1   Liver Function Tests:  Recent Labs Lab 04/20/15 1827  AST 28  ALT 28  ALKPHOS 80  BILITOT 0.8  PROT 7.2  ALBUMIN 4.0    Recent Labs Lab 04/20/15 1827  LIPASE 26  AMYLASE 50   CBC:  Recent Labs Lab 04/20/15 1827 04/21/15 0626 04/22/15 0318 04/24/15 0543  WBC 8.0 6.2 7.4 7.0  NEUTROABS 5.8  --   --   --   HGB 11.2* 9.5* 10.6* 11.1*  HCT 35.0* 29.9* 33.5* 33.7*  MCV 97.0 96.8 97.1 91.6  PLT 258 194 238 264   Cardiac Enzymes:  Recent Labs Lab 04/20/15 2313 04/21/15 0626 04/23/15 2103 04/24/15 2246 04/25/15 0546  TROPONINI <0.03 <0.03 <0.03 <0.03 <0.03    CBG:  Recent Labs Lab 04/24/15 1059 04/24/15 1642 04/24/15 2126 04/25/15 0730 04/25/15 1151  GLUCAP 104* 89 104* 104* 86    Recent Results (from the past 240 hour(s))  MRSA PCR Screening     Status: None   Collection Time: 04/20/15 10:41 PM  Result Value Ref Range Status   MRSA by PCR NEGATIVE NEGATIVE Final    Comment:        The GeneXpert MRSA Assay (FDA approved for NASAL specimens only), is one component of a comprehensive MRSA colonization surveillance program. It is not intended to diagnose MRSA infection nor to guide or monitor treatment for MRSA infections.      Studies: No results found.  Scheduled Meds: . antiseptic oral rinse  7 mL Mouth Rinse q12n4p  . baclofen  10 mg Oral TID  . chlorhexidine  15 mL Mouth Rinse BID  . clonazePAM  1 mg Oral QID  . DULoxetine  60 mg Oral Daily  . folic acid  1 mg Oral Daily  . furosemide  20 mg Oral BID  . gabapentin  800 mg Oral TID  . heparin  5,000 Units Subcutaneous 3 times per day  . insulin aspart  0-5 Units Subcutaneous QHS  . insulin aspart  0-9 Units Subcutaneous TID WC  . levothyroxine  100 mcg Oral QAC breakfast  . LORazepam  2 mg Oral 3 times per day  . multivitamin with minerals  1 tablet Oral Daily  . pantoprazole  40 mg Oral BID  . potassium chloride  40 mEq Oral Once  . pravastatin  40 mg  Oral q1800  . thiamine  100 mg Oral Daily  . vitamin B-12  1,000 mcg Oral Daily   Continuous Infusions: . sodium chloride 10 mL/hr at 04/22/15 1113  . dextrose 30 mL/hr at 04/24/15 4098    Principal Problem:   Drug overdose Active Problems:   Bipolar affective (HCC)   Acute respiratory failure with hypoxia (HCC)    Time spent: 30 minutes    Vassie Loll  [facility] Pager [phone]. If 7PM-7AM, please contact night-coverage at [email or web address], password [facility] 04/25/2015, 12:36 PM  LOS: 5 days

## 2015-04-26 DIAGNOSIS — G43009 Migraine without aura, not intractable, without status migrainosus: Secondary | ICD-10-CM | POA: Insufficient documentation

## 2015-04-26 DIAGNOSIS — R072 Precordial pain: Secondary | ICD-10-CM

## 2015-04-26 DIAGNOSIS — R499 Unspecified voice and resonance disorder: Secondary | ICD-10-CM

## 2015-04-26 DIAGNOSIS — F319 Bipolar disorder, unspecified: Secondary | ICD-10-CM

## 2015-04-26 DIAGNOSIS — K219 Gastro-esophageal reflux disease without esophagitis: Secondary | ICD-10-CM | POA: Insufficient documentation

## 2015-04-26 DIAGNOSIS — T50904A Poisoning by unspecified drugs, medicaments and biological substances, undetermined, initial encounter: Secondary | ICD-10-CM

## 2015-04-26 LAB — GLUCOSE, CAPILLARY
GLUCOSE-CAPILLARY: 95 mg/dL (ref 65–99)
GLUCOSE-CAPILLARY: 99 mg/dL (ref 65–99)
Glucose-Capillary: 98 mg/dL (ref 65–99)

## 2015-04-26 LAB — MAGNESIUM: Magnesium: 2.2 mg/dL (ref 1.7–2.4)

## 2015-04-26 LAB — PHOSPHORUS: Phosphorus: 4.7 mg/dL — ABNORMAL HIGH (ref 2.5–4.6)

## 2015-04-26 MED ORDER — PROMETHAZINE HCL 25 MG PO TABS
25.0000 mg | ORAL_TABLET | Freq: Three times a day (TID) | ORAL | Status: DC | PRN
  Administered 2015-04-26 – 2015-04-27 (×3): 25 mg via ORAL
  Filled 2015-04-26 (×3): qty 1

## 2015-04-26 MED ORDER — LORAZEPAM 1 MG PO TABS
2.0000 mg | ORAL_TABLET | Freq: Three times a day (TID) | ORAL | Status: DC | PRN
  Administered 2015-04-26 – 2015-04-27 (×2): 2 mg via ORAL
  Filled 2015-04-26 (×2): qty 2

## 2015-04-26 MED ORDER — DM-GUAIFENESIN ER 30-600 MG PO TB12
1.0000 | ORAL_TABLET | Freq: Two times a day (BID) | ORAL | Status: DC
  Administered 2015-04-26 – 2015-04-27 (×3): 1 via ORAL
  Filled 2015-04-26 (×3): qty 1

## 2015-04-26 NOTE — Progress Notes (Signed)
[facility] PROGRESS NOTE  Brittany [name] ZOX:[id number] DOB: 05/16/1965 DOA: 04/20/2015 PCP: Evelene [name], MD  Assessment/Plan: 1-acute resp failure: requiring intubation for airway protection. Also found to have pulmonary edema -now extubated and stable; no resp distress  -Good O2 sat on RA -will continue pulmonary hygiene -continue IS -continue lasix PO  2-QtC prolongation -with concerns for tricyclic overdose  -resolve dand stable on telemetry -in order to minimize agitation and given normal electrolytes now. Will d/c cardiac monitoring   3-Hypokalemia  -continue repletion as needed -diuresis contributing   4-GERD -will continue PPI  5-Obesity -low calorie diet discussed with patient -Body mass index is 39.94 kg/(m^2).  6-hypothyroidism  -continue synthroid   7-DVT prophylaxis  -continue lovenox  8-bipolar disorder: -continue ativan and klonopin  -psych recommending to hold on Pamelor -needs inpatient treatment   9-HLD: -continue statins  10-migraine : -will continue use PRN fioricet   11-cough/nausea -will use PRN phenergan PO and mucinex  Code Status: Full Family Communication: fiancee at bedside  Disposition Plan: Needs inpatient psychiatry treatment; remains inpatient waiting for bed.   Consultants:  Psych  PCCM  Procedures:  See below for x-ray reports   Patient required intubation from 3/7>>3/8  2-D echo: 3/10 - Left ventricle: The cavity size was normal. There was mild focal  basal hypertrophy of the septum. Systolic function was normal.  The estimated ejection fraction was in the range of 60% to 65%.  Wall motion was normal; there were no regional wall motion  abnormalities. Doppler parameters are consistent with abnormal  left ventricular relaxation (grade 1 diastolic dysfunction).  There was no evidence of elevated ventricular filling pressure by  Doppler parameters. - Aortic valve: Trileaflet; normal thickness  leaflets. There was no  regurgitation. - Aortic root: The aortic root was normal in size. - Mitral valve: Structurally normal valve. There was no  regurgitation. - Left atrium: The atrium was mildly dilated. - Right ventricle: The cavity size was normal. Wall thickness was  normal. Systolic function was normal. - Right atrium: The atrium was normal in size. - Tricuspid valve: There was mild regurgitation. - Pulmonic valve: There was no regurgitation. - Pulmonary arteries: Systolic pressure was within the normal  range. - Inferior vena cava: The vessel was normal in size. - Pericardium, extracardiac: There was no pericardial effusion.  Antibiotics:  None   HPI/Subjective: Afebrile, no SOB. Patient denies hallucinations, SI and homicidal ideas. No further CP. reprots some intermittent episodes of cough and nausea.  Objective: Filed Vitals:   04/25/15 2038 04/26/15 0636  BP: 154/98 124/78  Pulse: 106 78  Temp: 98.2 F (36.8 C) 97.8 F (36.6 C)  Resp: 20 20    Intake/Output Summary (Last 24 hours) at 04/26/15 1304 Last data filed at 04/26/15 0100  Gross per 24 hour  Intake    320 ml  Output    400 ml  Net    -80 ml   Filed Weights   04/24/15 0500 04/25/15 0500 04/26/15 0636  Weight: 108.636 kg (239 lb 8 oz) 107.366 kg (236 lb 11.2 oz) 105.6 kg (232 lb 12.9 oz)    Exam:   General:  Afebrile, no CP, no SOB. Denies SI, hallucinations and homicidal ideation currently. She is IVC and waiting for bed availability at psych facility. Reports some nausea and intermittent episodes of coughing spells  Cardiovascular: S1 and S2, no rubs or gallops  Respiratory: good air movement, no wheezing   Abdomen: obese, soft, NT, ND, positive BS  Musculoskeletal:edema  bilaterally (1+) lower extremities mainly; edema on her hands resolved  Data Reviewed: Basic Metabolic Panel:  Recent Labs Lab 04/20/15 1827 04/21/15 0626 04/22/15 0318 04/24/15 0543 04/25/15 0546  04/26/15 0627  NA 148* 146* 149* 141 142  --   K 3.3* 3.4* 3.2* 3.7 3.6  --   CL 109 115* 109 106 105  --   CO2 --   GLUCOSE 102* 124* 116* 104* 123*  --   BUN --   CREATININE 0.84 0.73 0.62 0.63 0.68  --   CALCIUM 9.1 7.2* 8.2* 9.4 9.2  --   MG  --  1.9  --  2.0 2.0 2.2  PHOS  --  2.5  --  4.9* 4.1 4.7*   Liver Function Tests:  Recent Labs Lab 04/20/15 1827  AST 28  ALT 28  ALKPHOS 80  BILITOT 0.8  PROT 7.2  ALBUMIN 4.0    Recent Labs Lab 04/20/15 1827  LIPASE 26  AMYLASE 50   CBC:  Recent Labs Lab 04/20/15 1827 04/21/15 0626 04/22/15 0318 04/24/15 0543  WBC 8.0 6.2 7.4 7.0  NEUTROABS 5.8  --   --   --   HGB 11.2* 9.5* 10.6* 11.1*  HCT 35.0* 29.9* 33.5* 33.7*  MCV 97.0 96.8 97.1 91.6  PLT 258 194 238 264   Cardiac Enzymes:  Recent Labs Lab 04/21/15 0626 04/23/15 2103 04/24/15 2246 04/25/15 0546 04/25/15 1200  TROPONINI <0.03 <0.03 <0.03 <0.03 <0.03    CBG:  Recent Labs Lab 04/25/15 0730 04/25/15 1151 04/25/15 1720 04/25/15 2035 04/26/15 0931  GLUCAP 104* 86 90 105* 98    Recent Results (from the past 240 hour(s))  MRSA PCR Screening     Status: None   Collection Time: 04/20/15 10:41 PM  Result Value Ref Range Status   MRSA by PCR NEGATIVE NEGATIVE Final    Comment:        The GeneXpert MRSA Assay (FDA approved for NASAL specimens only), is one component of a comprehensive MRSA colonization surveillance program. It is not intended to diagnose MRSA infection nor to guide or monitor treatment for MRSA infections.      Studies: No results found.  Scheduled Meds: . antiseptic oral rinse  7 mL Mouth Rinse q12n4p  . baclofen  10 mg Oral TID  . chlorhexidine  15 mL Mouth Rinse BID  . clonazePAM  1 mg Oral QID  . DULoxetine  60 mg Oral Daily  . folic acid  1 mg Oral Daily  . furosemide  20 mg Oral BID  . gabapentin  800 mg Oral TID  . heparin  5,000 Units Subcutaneous 3 times per day  .  insulin aspart  0-5 Units Subcutaneous QHS  . insulin aspart  0-9 Units Subcutaneous TID WC  . levothyroxine  100 mcg Oral QAC breakfast  . LORazepam  2 mg Oral 3 times per day  . multivitamin with minerals  1 tablet Oral Daily  . pantoprazole  40 mg Oral BID  . potassium chloride  40 mEq Oral Once  . pravastatin  40 mg Oral q1800  . thiamine  100 mg Oral Daily  . vitamin B-12  1,000 mcg Oral Daily   Continuous Infusions: . sodium chloride 10 mL/hr at 04/22/15 1113  . dextrose 30 mL/hr at 04/24/15 4098    Principal Problem:   Drug overdose Active Problems:   Bipolar affective (HCC)   Acute respiratory failure  with hypoxia (HCC)    Time spent: 30 minutes    Vassie LollMadera, Haylee Mcanany  [facility] Pager [phone]. If 7PM-7AM, please contact night-coverage at [email or web address], password [email or web address] 04/26/2015, 1:04 PM  LOS: 6 days

## 2015-04-26 NOTE — Progress Notes (Signed)
PT Cancellation Note  Patient Details Name: Brittany [name] MRN: [id number] DOB: August 26, 1965   Cancelled Treatment:    Reason Eval/Treat Not Completed: Medical issues which prohibited therapy (having  Diarrhea, up in room with sitter. will check back later time. )   Rada HayHill, Ottie Neglia Elizabeth 04/26/2015, 11:52 AM Blanchard KelchKaren Hellon Vaccarella PT [phone]
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
## 2015-04-27 ENCOUNTER — Inpatient Hospital Stay (HOSPITAL_COMMUNITY)
Admission: AD | Admit: 2015-04-27 | Discharge: 2015-05-03 | DRG: 885 | Disposition: A | Discharge status: Home / Self Care | Payer: Medicaid Other | Source: Intra-hospital | Attending: Psychiatry | Admitting: Psychiatry

## 2015-04-27 ENCOUNTER — Encounter (HOSPITAL_COMMUNITY): Payer: Self-pay | Admitting: *Deleted

## 2015-04-27 DIAGNOSIS — T424X2A Poisoning by benzodiazepines, intentional self-harm, initial encounter: Secondary | ICD-10-CM | POA: Diagnosis not present

## 2015-04-27 DIAGNOSIS — I4581 Long QT syndrome: Secondary | ICD-10-CM | POA: Diagnosis not present

## 2015-04-27 DIAGNOSIS — G500 Trigeminal neuralgia: Secondary | ICD-10-CM | POA: Diagnosis present

## 2015-04-27 DIAGNOSIS — T428X2A Poisoning by antiparkinsonism drugs and other central muscle-tone depressants, intentional self-harm, initial encounter: Secondary | ICD-10-CM | POA: Diagnosis not present

## 2015-04-27 DIAGNOSIS — F3163 Bipolar disorder, current episode mixed, severe, without psychotic features: Secondary | ICD-10-CM | POA: Diagnosis not present

## 2015-04-27 DIAGNOSIS — G43009 Migraine without aura, not intractable, without status migrainosus: Secondary | ICD-10-CM | POA: Diagnosis not present

## 2015-04-27 DIAGNOSIS — Z915 Personal history of self-harm: Secondary | ICD-10-CM

## 2015-04-27 DIAGNOSIS — J9601 Acute respiratory failure with hypoxia: Secondary | ICD-10-CM | POA: Diagnosis not present

## 2015-04-27 DIAGNOSIS — R9431 Abnormal electrocardiogram [ECG] [EKG]: Secondary | ICD-10-CM | POA: Diagnosis present

## 2015-04-27 DIAGNOSIS — Z6841 Body Mass Index (BMI) 40.0 and over, adult: Secondary | ICD-10-CM | POA: Diagnosis not present

## 2015-04-27 DIAGNOSIS — R45851 Suicidal ideations: Secondary | ICD-10-CM

## 2015-04-27 DIAGNOSIS — T43012A Poisoning by tricyclic antidepressants, intentional self-harm, initial encounter: Principal | ICD-10-CM

## 2015-04-27 DIAGNOSIS — K219 Gastro-esophageal reflux disease without esophagitis: Secondary | ICD-10-CM | POA: Diagnosis not present

## 2015-04-27 DIAGNOSIS — F319 Bipolar disorder, unspecified: Secondary | ICD-10-CM | POA: Diagnosis not present

## 2015-04-27 DIAGNOSIS — T1491 Suicide attempt: Secondary | ICD-10-CM

## 2015-04-27 DIAGNOSIS — F3181 Bipolar II disorder: Secondary | ICD-10-CM | POA: Diagnosis not present

## 2015-04-27 LAB — PHOSPHORUS: PHOSPHORUS: 4.2 mg/dL (ref 2.5–4.6)

## 2015-04-27 LAB — GLUCOSE, CAPILLARY
GLUCOSE-CAPILLARY: 100 mg/dL — AB (ref 65–99)
GLUCOSE-CAPILLARY: 111 mg/dL — AB (ref 65–99)
GLUCOSE-CAPILLARY: 118 mg/dL — AB (ref 65–99)

## 2015-04-27 LAB — MAGNESIUM: MAGNESIUM: 2 mg/dL (ref 1.7–2.4)

## 2015-04-27 MED ORDER — DULOXETINE HCL 60 MG PO CPEP
60.0000 mg | ORAL_CAPSULE | Freq: Every day | ORAL | Status: DC
  Administered 2015-04-28 – 2015-05-03 (×6): 60 mg via ORAL
  Filled 2015-04-27 (×9): qty 1

## 2015-04-27 MED ORDER — THIAMINE HCL 100 MG PO TABS: 100.0000 mg | ORAL_TABLET | Freq: Every day | ORAL | Status: DC

## 2015-04-27 MED ORDER — ADULT MULTIVITAMIN W/MINERALS CH
1.0000 | ORAL_TABLET | ORAL | Status: DC
Start: 2015-04-27 — End: 2015-04-27
  Filled 2015-04-27: qty 1

## 2015-04-27 MED ORDER — ACETAMINOPHEN 325 MG PO TABS
650.0000 mg | ORAL_TABLET | Freq: Four times a day (QID) | ORAL | Status: DC | PRN
  Administered 2015-04-28 – 2015-04-29 (×3): 650 mg via ORAL
  Filled 2015-04-27 (×3): qty 2

## 2015-04-27 MED ORDER — PANTOPRAZOLE SODIUM 40 MG PO TBEC
40.0000 mg | DELAYED_RELEASE_TABLET | Freq: Two times a day (BID) | ORAL | Status: DC
  Administered 2015-04-30 – 2015-05-03 (×3): 40 mg via ORAL
  Filled 2015-04-27 (×17): qty 1

## 2015-04-27 MED ORDER — MAGNESIUM HYDROXIDE 400 MG/5ML PO SUSP
30.0000 mL | Freq: Every day | ORAL | Status: DC | PRN
  Administered 2015-05-02: 30 mL via ORAL
  Filled 2015-04-27: qty 30

## 2015-04-27 MED ORDER — VITAMIN B-12 1000 MCG PO TABS
1000.0000 ug | ORAL_TABLET | Freq: Every day | ORAL | Status: DC
  Administered 2015-04-28 – 2015-05-03 (×6): 1000 ug via ORAL
  Filled 2015-04-27 (×8): qty 1

## 2015-04-27 MED ORDER — VITAMIN B-1 100 MG PO TABS
100.0000 mg | ORAL_TABLET | Freq: Every day | ORAL | Status: DC
  Administered 2015-04-28 – 2015-05-03 (×6): 100 mg via ORAL
  Filled 2015-04-27 (×9): qty 1

## 2015-04-27 MED ORDER — FOLIC ACID 1 MG PO TABS: 1.0000 mg | ORAL_TABLET | Freq: Every day | ORAL | Status: DC

## 2015-04-27 MED ORDER — FOLIC ACID 1 MG PO TABS
1.0000 mg | ORAL_TABLET | Freq: Every day | ORAL | Status: DC
  Administered 2015-04-28 – 2015-05-03 (×6): 1 mg via ORAL
  Filled 2015-04-27 (×9): qty 1

## 2015-04-27 MED ORDER — VITAMIN D3 25 MCG (1000 UNIT) PO TABS
1000.0000 [IU] | ORAL_TABLET | ORAL | Status: DC
  Administered 2015-04-27 – 2015-05-03 (×4): 1000 [IU] via ORAL
  Filled 2015-04-27 (×6): qty 1

## 2015-04-27 MED ORDER — QUETIAPINE FUMARATE 50 MG PO TABS
50.0000 mg | ORAL_TABLET | Freq: Every evening | ORAL | Status: DC | PRN
  Administered 2015-04-27 – 2015-04-28 (×3): 50 mg via ORAL
  Filled 2015-04-27 (×9): qty 1

## 2015-04-27 MED ORDER — ACETAMINOPHEN 325 MG PO TABS: 650.0000 mg | ORAL_TABLET | Freq: Four times a day (QID) | ORAL | Status: DC | PRN

## 2015-04-27 MED ORDER — LEVOTHYROXINE SODIUM 100 MCG PO TABS
100.0000 ug | ORAL_TABLET | Freq: Every day | ORAL | Status: DC
Start: 2015-04-28 — End: 2015-05-03
  Administered 2015-04-28 – 2015-05-03 (×6): 100 ug via ORAL
  Filled 2015-04-27 (×9): qty 1

## 2015-04-27 MED ORDER — ALUM & MAG HYDROXIDE-SIMETH 200-200-20 MG/5ML PO SUSP: 30.0000 mL | ORAL | Status: DC | PRN

## 2015-04-27 MED ORDER — ADULT MULTIVITAMIN W/MINERALS CH
1.0000 | ORAL_TABLET | ORAL | Status: DC
  Administered 2015-04-29 – 2015-05-03 (×3): 1 via ORAL
  Filled 2015-04-27 (×4): qty 1

## 2015-04-27 MED ORDER — DM-GUAIFENESIN ER 30-600 MG PO TB12: 1.0000 | ORAL_TABLET | Freq: Two times a day (BID) | ORAL | Status: DC | PRN

## 2015-04-27 MED ORDER — BUTALBITAL-APAP-CAFFEINE 50-325-40 MG PO TABS: 1.0000 | ORAL_TABLET | ORAL | Status: DC | PRN

## 2015-04-27 MED ORDER — PRAVASTATIN SODIUM 40 MG PO TABS
40.0000 mg | ORAL_TABLET | Freq: Every day | ORAL | Status: DC
  Administered 2015-04-30 – 2015-05-03 (×4): 40 mg via ORAL
  Filled 2015-04-27 (×8): qty 1

## 2015-04-27 MED ORDER — PROMETHAZINE HCL 25 MG PO TABS
25.0000 mg | ORAL_TABLET | Freq: Three times a day (TID) | ORAL | Status: DC | PRN
  Administered 2015-04-28 – 2015-04-30 (×3): 25 mg via ORAL
  Filled 2015-04-27 (×4): qty 1

## 2015-04-27 MED ORDER — CLONAZEPAM 1 MG PO TABS
1.0000 mg | ORAL_TABLET | Freq: Three times a day (TID) | ORAL | Status: DC | PRN
  Administered 2015-04-27 – 2015-04-29 (×4): 1 mg via ORAL
  Filled 2015-04-27 (×4): qty 1

## 2015-04-27 MED ORDER — BUTALBITAL-APAP-CAFFEINE 50-325-40 MG PO TABS
1.0000 | ORAL_TABLET | ORAL | Status: DC | PRN
  Administered 2015-04-27 – 2015-04-30 (×10): 1 via ORAL
  Filled 2015-04-27 (×10): qty 1

## 2015-04-27 MED ORDER — GABAPENTIN 300 MG PO CAPS
600.0000 mg | ORAL_CAPSULE | Freq: Three times a day (TID) | ORAL | Status: DC
  Administered 2015-04-28 – 2015-05-03 (×17): 600 mg via ORAL
  Filled 2015-04-27 (×24): qty 2

## 2015-04-27 MED ORDER — DM-GUAIFENESIN ER 30-600 MG PO TB12
1.0000 | ORAL_TABLET | Freq: Two times a day (BID) | ORAL | Status: DC | PRN
  Administered 2015-05-01: 1 via ORAL
  Filled 2015-04-27: qty 1

## 2015-04-27 MED ORDER — FUROSEMIDE 20 MG PO TABS: 20.0000 mg | ORAL_TABLET | Freq: Two times a day (BID) | ORAL | Status: DC

## 2015-04-27 MED ORDER — PROMETHAZINE HCL 25 MG PO TABS: 25.0000 mg | ORAL_TABLET | Freq: Three times a day (TID) | ORAL | Status: DC | PRN

## 2015-04-27 MED ORDER — CYANOCOBALAMIN 1000 MCG PO TABS: 1000.0000 ug | ORAL_TABLET | Freq: Every day | ORAL | Status: DC

## 2015-04-27 MED ORDER — FUROSEMIDE 20 MG PO TABS
20.0000 mg | ORAL_TABLET | Freq: Two times a day (BID) | ORAL | Status: DC
  Administered 2015-04-28 – 2015-05-03 (×11): 20 mg via ORAL
  Filled 2015-04-27 (×16): qty 1

## 2015-04-27 NOTE — Progress Notes (Signed)
Sitter at bedside. Patient is resting

## 2015-04-27 NOTE — Progress Notes (Signed)
CSW has faxed IVC paperwork to Magistrate and spoke to him re: [facility]  [facility].
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
## 2015-04-27 NOTE — Progress Notes (Signed)
50 year old female pt admitted on involuntary basis. [facility] but pt denies that she was depressed or suicidal and reports that she overused her medications because she was having pain due to her trigeminal neuralgia. Pt reports that while she is here she would like to talk about her PTSD and anxiety and reports that she has daily panic attacks. Pt is able to contract for safety on the unit. Pt was oriented to the unit and safety maintained.

## 2015-04-27 NOTE — Progress Notes (Signed)
Sitter is at bedside, pt is resting.

## 2015-04-27 NOTE — Discharge Summary (Signed)
Physician Discharge Summary  Brittany [name] XBM:[id number] DOB: [birth year] DOA: 04/20/2015  PCP: Evelene [name], MD  Admit date: 04/20/2015 Discharge date: 04/27/2015  Time spent: 40 minutes  Recommendations for Outpatient Follow-up:  1. Repeat BMET in 5 days to follow electrolytes and renal function  2. Reassess BP and volume status; lasix to finalize in 7 days. 3. Follow up with PCP in 2 weeks after discharge from [facility]   Discharge Diagnoses:  Principal Problem:   Drug overdose Active Problems:   Bipolar affective (HCC)   Acute respiratory failure with hypoxia (HCC)   Morbid obesity due to excess calories (HCC)   Esophageal reflux   Migraine without aura and without status migrainosus, not intractable   Discharge Condition: stable and improved. Currently w/o SI or hallucinations. Will discharge to [facility] for further mood stabilization and adjustments on her psychiatric medications.  Diet recommendation: heart healthy diet   Filed Weights   04/25/15 0500 04/26/15 0636 04/27/15 0628  Weight: 107.366 kg (236 lb 11.2 oz) 105.6 kg (232 lb 12.9 oz) 106.686 kg (235 lb 3.2 oz)    History of present illness:  As per H&P written by Dr. Tyson Alias on 04/20/15 50 yr old h/o bipola dz. Brought in by EMS with change in MS, lethargic, a notion of info about possible tryclcic OD. Although, given Narcan and woke up with severe agitation. Lost her IV access. IM haldol, ativan attempting. NO other history available. She is currently trying to hurt providers severely.  Hospital Course:  1-acute resp failure: requiring intubation for airway protection. Also found to have pulmonary edema -now extubated and stable; no resp distress  -Good O2 sat on RA -will continue pulmonary hygiene -continue IS -continue lasix PO for another 7 days and reassess volume status and need for further diuretic therapy.  2-QtC prolongation -with concerns for tricyclic overdose  -resolved and stable on  telemetry  3-Hypokalemia  -continue repletion as needed -diuresis contributing   4-GERD -will continue PPI  5-Obesity -low calorie diet discussed with patient -Body mass index is 39.94 kg/(m^2).  6-hypothyroidism  -continue synthroid   7-bipolar disorder: -continue ativan and klonopin for anxiety -psych recommending to hold on Pamelor; ok to continue cymbalta -needs inpatient treatment for further medication adjustments, psychotherapy and mood stabilization  8-HLD: -continue statins  9-migraine : -will continue use PRN fioricet   10-cough/nausea -will use PRN phenergan PO and mucinex  Procedures:  See below for x-ray reports   Patient required intubation from 3/7>>3/8  2-D echo: 3/10 - Left ventricle: The cavity size was normal. There was mild focal  basal hypertrophy of the septum. Systolic function was normal.  The estimated ejection fraction was in the range of 60% to 65%.  Wall motion was normal; there were no regional wall motion  abnormalities. Doppler parameters are consistent with abnormal  left ventricular relaxation (grade 1 diastolic dysfunction).  There was no evidence of elevated ventricular filling pressure by  Doppler parameters. - Aortic valve: Trileaflet; normal thickness leaflets. There was no  regurgitation. - Aortic root: The aortic root was normal in size. - Mitral valve: Structurally normal valve. There was no  regurgitation. - Left atrium: The atrium was mildly dilated. - Right ventricle: The cavity size was normal. Wall thickness was  normal. Systolic function was normal. - Right atrium: The atrium was normal in size. - Tricuspid valve: There was mild regurgitation. - Pulmonic valve: There was no regurgitation. - Pulmonary arteries: Systolic pressure was within the normal  range. - Inferior vena  cava: The vessel was normal in size. - Pericardium, extracardiac: There was no pericardial  effusion.  Consultations:  PCCM  Psychiatry   Discharge Exam: Filed Vitals:   04/26/15 2124 04/27/15 0628  BP: 108/62 106/72  Pulse: 92 82  Temp: 97.4 F (36.3 C) 97.2 F (36.2 C)  Resp: 22 20    General: Afebrile, no CP, no SOB. Denies SI, hallucinations and homicidal ideation currently. She is IVC and waiting for bed availability at psych facility. Reports some nausea and intermittent episodes of coughing spells; which have improved with PRN meds (phenergan and mucinex)  Cardiovascular: S1 and S2, no rubs or gallops  Respiratory: good air movement, no wheezing   Abdomen: obese, soft, NT, ND, positive BS  Musculoskeletal:edema bilaterally (1+) lower extremities mainly; edema on her hands resolved   Discharge Instructions   Discharge Instructions    Diet - low sodium heart healthy    Complete by:  As directed      Discharge instructions    Complete by:  As directed   Medications as prescribed Please keep patient well hydrated Psychiatry service to start/adjust medications for mood stabilization          Current Discharge Medication List    START taking these medications   Details  acetaminophen (TYLENOL) 325 MG tablet Take 2 tablets (650 mg total) by mouth every 6 (six) hours as needed for mild pain, moderate pain or headache.    butalbital-acetaminophen-caffeine (FIORICET, ESGIC) 50-325-40 MG tablet Take 1 tablet by mouth every 4 (four) hours as needed for headache or migraine.    dextromethorphan-guaiFENesin (MUCINEX DM) 30-600 MG 12hr tablet Take 1 tablet by mouth 2 (two) times daily as needed for cough.    folic acid (FOLVITE) 1 MG tablet Take 1 tablet (1 mg total) by mouth daily.    furosemide (LASIX) 20 MG tablet Take 1 tablet (20 mg total) by mouth 2 (two) times daily. Qty: 30 tablet    promethazine (PHENERGAN) 25 MG tablet Take 1 tablet (25 mg total) by mouth every 8 (eight) hours as needed for nausea or vomiting. Qty: 30 tablet, Refills: 0     thiamine 100 MG tablet Take 1 tablet (100 mg total) by mouth daily.    !! vitamin B-12 1000 MCG tablet Take 1 tablet (1,000 mcg total) by mouth daily.     !! - Potential duplicate medications found. Please discuss with provider.    CONTINUE these medications which have NOT CHANGED   Details  DULoxetine (CYMBALTA) 60 MG capsule Take 60 mg by mouth daily.    levothyroxine (SYNTHROID, LEVOTHROID) 100 MCG tablet Take 100 mcg by mouth daily before breakfast.    Cholecalciferol (VITAMIN D PO) Take 1 tablet by mouth every other day.    clonazePAM (KLONOPIN) 1 MG tablet TK 1 T PO QID Refills: 0    !! cyanocobalamin 1000 MCG tablet Take 1,000 mcg by mouth daily.    gabapentin (NEURONTIN) 300 MG capsule Take 2 capsules (600 mg total) by mouth 3 (three) times daily.    Multiple Vitamin (MULTIVITAMIN WITH MINERALS) TABS tablet Take 1 tablet by mouth every other day.    pantoprazole (PROTONIX) 40 MG tablet Take 1 tablet (40 mg total) by mouth 2 (two) times daily.    pravastatin (PRAVACHOL) 40 MG tablet Take 40 mg by mouth daily.     !! - Potential duplicate medications found. Please discuss with provider.    STOP taking these medications     baclofen (LIORESAL) 10  MG tablet      Benzphetamine HCl 25 MG TABS      cyclobenzaprine (FLEXERIL) 10 MG tablet      gabapentin (NEURONTIN) 800 MG tablet      nortriptyline (PAMELOR) 25 MG capsule        Allergies  Allergen Reactions  . Toradol [Ketorolac Tromethamine] Swelling  . Suboxone [Buprenorphine Hcl-Naloxone Hcl]     cellulitis   . Tegretol [Carbamazepine] Rash  . Trazodone And Nefazodone Other (See Comments)    Bladder infection   Follow-up Information    Follow up with Evelene [name], MD. Schedule an appointment as soon as possible for a visit in 2 weeks.   Specialty:  Family Medicine   Contact information:   [facility] [place] [address] [phone]       The results of significant diagnostics from this  hospitalization (including imaging, microbiology, ancillary and laboratory) are listed below for reference.    Significant Diagnostic Studies: Dg Chest Port 1 View  04/22/2015  CLINICAL DATA:  Drug overdose, acute respiratory failure EXAM: PORTABLE CHEST 1 VIEW COMPARISON:  Portable chest x-ray of April 21, 2015 FINDINGS: The patient has undergone interval extubation of the trachea and esophagus. The lungs remain borderline hypoinflated. There is persistent bibasilar atelectasis or pneumonia. There is no large pleural effusion and no pneumothorax. The cardiac silhouette is enlarged. The pulmonary vascularity is not engorged. IMPRESSION: Mild bilateral hypoinflation with bibasilar atelectasis or pneumonia. Cardiomegaly without pulmonary vascular congestion. Electronically Signed   By: David  [place] M.D.   On: 04/22/2015 07:19   Portable Chest Xray  04/21/2015  CLINICAL DATA:  Overdose.  ETT position EXAM: PORTABLE CHEST 1 VIEW COMPARISON:  04/20/2015 FINDINGS: Endotracheal tube remains in stable position. NG tube is in placed into the stomach. Worsening bilateral perihilar and lower lobe airspace opacities which could reflect edema. Heart is borderline in size. Low lung volumes. Possible small layering effusions. IMPRESSION: Worsening aeration with bilateral perihilar and lower lobe opacities, likely edema. Low lung volumes, question small effusions. Electronically Signed   By: Charlett Nose M.D.   On: 04/21/2015 07:23   Portable Chest Xray  04/20/2015  CLINICAL DATA:  Endotracheal tube placement. EXAM: PORTABLE CHEST 1 VIEW COMPARISON:  03/02/2015 FINDINGS: The endotracheal tube tip projects 2.5 cm above the carina, well positioned. There is streaky opacity in the left lung base, likely atelectasis. This is similar to the prior study. Right basilar atelectasis has improved. No new lung opacities. No evidence of pulmonary edema. No convincing pleural effusion and no pneumothorax. Cardiac silhouette is normal in  size. IMPRESSION: 1. Endotracheal tube is well positioned with its tip 2.5 cm above the carina. Electronically Signed   By: Amie Portland M.D.   On: 04/20/2015 18:24   Dg Abd Portable 1v  04/21/2015  CLINICAL DATA:  Orogastric tube placement EXAM: PORTABLE ABDOMEN - 1 VIEW COMPARISON:  Portable exam 0951 hours compared to 02/28/2015 FINDINGS: Orogastric tube coiled in proximal stomach with tip projecting over gastric antrum. Visualized bowel gas pattern normal. Streaky atelectasis LEFT lower lobe. Bones demineralized. IMPRESSION: Nasogastric tube coiled in proximal stomach with tip projecting over gastric antrum. Electronically Signed   By: Ulyses Southward M.D.   On: 04/21/2015 10:21    Microbiology: Recent Results (from the past 240 hour(s))  MRSA PCR Screening     Status: None   Collection Time: 04/20/15 10:41 PM  Result Value Ref Range Status   MRSA by PCR NEGATIVE NEGATIVE Final    Comment:  The GeneXpert MRSA Assay (FDA approved for NASAL specimens only), is one component of a comprehensive MRSA colonization surveillance program. It is not intended to diagnose MRSA infection nor to guide or monitor treatment for MRSA infections.      Labs: Basic Metabolic Panel:  Recent Labs Lab 04/20/15 1827 04/21/15 [id number] 04/22/15 [id number] 04/24/15 0543 04/25/15 0546 04/26/15 0627 04/27/15 0624  NA 148* 146* 149* 141 142  --   --   K 3.3* 3.4* 3.2* 3.7 3.6  --   --   CL 109 115* 109 106 105  --   --   CO2 26 25 31 25 24   --   --   GLUCOSE 102* 124* 116* 104* 123*  --   --   BUN 13 11 8 10 13   --   --   CREATININE 0.84 0.73 0.62 0.63 0.68  --   --   CALCIUM 9.1 7.2* 8.2* 9.4 9.2  --   --   MG  --  1.9  --  2.0 2.0 2.2 2.0  PHOS  --  2.5  --  4.9* 4.1 4.7* 4.2   Liver Function Tests:  Recent Labs Lab 04/20/15 1827  AST 28  ALT 28  ALKPHOS 80  BILITOT 0.8  PROT 7.2  ALBUMIN 4.0    Recent Labs Lab 04/20/15 1827  LIPASE 26  AMYLASE 50   CBC:  Recent Labs Lab  04/20/15 1827 04/21/15 0626 04/22/15 0318 04/24/15 0543  WBC 8.0 6.2 7.4 7.0  NEUTROABS 5.8  --   --   --   HGB 11.2* 9.5* 10.6* 11.1*  HCT 35.0* 29.9* 33.5* 33.7*  MCV 97.0 96.8 97.1 91.6  PLT 258 194 238 264   Cardiac Enzymes:  Recent Labs Lab 04/21/15 0626 04/23/15 2103 04/24/15 2246 04/25/15 0546 04/25/15 1200  TROPONINI <0.03 <0.03 <0.03 <0.03 <0.03   CBG:  Recent Labs Lab 04/25/15 2035 04/26/15 0931 04/26/15 1210 04/26/15 2102 04/27/15 0810  GLUCAP 105* 98 95 99 100*    Signed:  Vassie LollMadera, Elspeth Blucher MD.  [facility] 04/27/2015, 3:12 PM

## 2015-04-27 NOTE — Tx Team (Signed)
Initial Interdisciplinary Treatment Plan   PATIENT STRESSORS: Health problems   PATIENT STRENGTHS: Ability for insight Active sense of humor Capable of independent living Communication skills General fund of knowledge   PROBLEM LIST: Problem List/Patient Goals Date to be addressed Date deferred Reason deferred Estimated date of resolution  Anxiety 04/27/15     Suicide attempt 04/27/15     "I want to talk about PTSD and panic attacks" 04/27/15                                          DISCHARGE CRITERIA:  Ability to meet basic life and health needs Improved stabilization in mood, thinking, and/or behavior Verbal commitment to aftercare and medication compliance  PRELIMINARY DISCHARGE PLAN: Attend aftercare/continuing care group Return to previous living arrangement  PATIENT/FAMIILY INVOLVEMENT: This treatment plan has been presented to and reviewed with the patient, Brittany [name], and/or family member,.  The patient and family have been given the opportunity to ask questions and make suggestions.  Brittany [name], Brittany [name] 04/27/2015, 8:43 PM

## 2015-04-27 NOTE — Progress Notes (Signed)
[facility] with unit cm about pt, [facility]

## 2015-04-27 NOTE — Progress Notes (Signed)
sheriffs office contacted

## 2015-04-28 ENCOUNTER — Encounter (HOSPITAL_COMMUNITY): Payer: Self-pay | Admitting: Psychiatry

## 2015-04-28 DIAGNOSIS — F3181 Bipolar II disorder: Secondary | ICD-10-CM

## 2015-04-28 LAB — URINE DRUGS OF ABUSE SCREEN W ALC, ROUTINE (REF LAB)
BARBITURATE, UR: NEGATIVE ng/mL
CANNABINOID QUANT UR: NEGATIVE ng/mL
Cocaine (Metab.): NEGATIVE ng/mL
ETHANOL U, QUAN: NEGATIVE %
Methadone Screen, Urine: NEGATIVE ng/mL
Opiate Quant, Ur: NEGATIVE ng/mL
Phencyclidine, Ur: NEGATIVE ng/mL
Propoxyphene, Urine: NEGATIVE ng/mL

## 2015-04-28 LAB — COMPREHENSIVE METABOLIC PANEL
ALBUMIN: 4.1 g/dL (ref 3.5–5.0)
ALK PHOS: 70 U/L (ref 38–126)
ALT: 34 U/L (ref 14–54)
AST: 27 U/L (ref 15–41)
Anion gap: 10 (ref 5–15)
BUN: 17 mg/dL (ref 6–20)
CALCIUM: 9.1 mg/dL (ref 8.9–10.3)
CHLORIDE: 100 mmol/L — AB (ref 101–111)
CO2: 28 mmol/L (ref 22–32)
CREATININE: 0.7 mg/dL (ref 0.44–1.00)
GFR calc Af Amer: >60 mL/min (ref >60)
GFR calc non Af Amer: >60 mL/min (ref >60)
GLUCOSE: 83 mg/dL (ref 65–99)
Potassium: 4.1 mmol/L (ref 3.5–5.1)
SODIUM: 138 mmol/L (ref 135–145)
Total Bilirubin: 0.4 mg/dL (ref 0.3–1.2)
Total Protein: 7.4 g/dL (ref 6.5–8.1)

## 2015-04-28 LAB — AMPHETAMINE CONF, UR: Amphetamines: NEGATIVE

## 2015-04-28 LAB — DRUG PROFILE 799031: BENZODIAZEPINES: NEGATIVE

## 2015-04-28 NOTE — [facility] Counselor (Signed)
PSA attempt w patient, patient on phone and unwilling to end call, wants to talk later.  Santa GeneraAnne Cunningham, LCSW Lead Clinical Social Worker Phone:  [phone]

## 2015-04-28 NOTE — [facility] Group Notes (Signed)
[facility]   04/28/2015 11:29 AM  Participation Quality:  Engaged  Mood/Affect:  Appropriate  Depression Rating:  denies  Anxiety Rating:  denies  Thoughts of Suicide:  No Will you contract for safety?   NA  Current AVH:  No  Plan for Discharge/Comments:  "The only reason I am here is [place] made [place] come.  I was not trying to kill myself.  I am in pain 24/7 and was trying to make it go away.  I live with my fiance and he is supportive.  I guess all I need is a follow up appointment.  I need you to make me an appointment with my neurologist."  Told her I could not do that. "OK, [facility]  Transportation Means:   Supports:  Daryel GeraldNorth, Seaira Byus B

## 2015-04-28 NOTE — Progress Notes (Signed)
Adult Psychoeducational Group Note  Date:  04/28/2015 Time:  8:45 PM  Group Topic/Focus:  Wrap-Up Group:   The focus of this group is to help patients review their daily goal of treatment and discuss progress on daily workbooks.  Participation Level:  Active  Participation Quality:  Attentive  Affect:  Appropriate  Cognitive:  Appropriate  Insight: Appropriate  Engagement in Group:  Engaged  Modes of Intervention:  Discussion  Additional Comments:  Pt had a positive day. Pt stayed busy by coloring and drawing. Pt mentioned she has been having bad face pain but she was able to make all the appointments she needs upon discharge to handle her surgeries. Pt goal for tomorrow is to continue to stay busy and [place].   Brittany [name] 04/28/2015, 8:45 PM

## 2015-04-28 NOTE — [facility] Group Notes (Signed)
[facility] LCSW Group Therapy Group Therapy:  Recovering Wellness  1:15 - 2: 30 PM   Date:  04/28/2015 Time:  2:14 PM   Type of Therapy: Group Therapy  Participation Level: Appropriate  Participation Quality: Appropriate  Affect:Calm, attentive, sometimes intrusive  Cognitive: Appropriate  Insight: Developing/Improving   Engagement in Therapy: Developing/Improving   Modes of Intervention: Discussion Exploration Problem-Solving Supportive  Summary of Progress/Problems: [facility] Speaker came to talk about his personal journey with substance abuse and behavioral health.  Speaker highlighted multiple approaches to recovering wellness and emphasized need for recovery groups, medications, peer and social support. The pt processed ways by which to relate to the speaker. [facility]HA speaker provided handouts and educational information pertaining to groups and services offered by [facility]. Expressed much interest in presentation, offered stories of her own experience.  Expressed disgust w actions of others who were loud or went in/out of group.     Santa GeneraAnne Debbi Strandberg, LCSW Lead Clinical Social Worker Phone:  [phone]

## 2015-04-28 NOTE — H&P (Signed)
Psychiatric Admission Assessment Adult  Patient Identification: Brittany [name]  MRN:  [id number]  Date of Evaluation:  04/28/2015  Chief Complaint: Suicide attempt by overdose  Principal Diagnosis: Bipolar 2 disorder, major depressive episode ([place])  Diagnosis:   Patient Active Problem List   Diagnosis Date Noted  . Bipolar 2 disorder, major depressive episode ([place]) [F31.81] 04/27/2015  . Morbid obesity due to excess calories ([place]) [E66.01]   . Esophageal reflux [K21.9]   . Migraine without aura and without status migrainosus, not intractable [G43.009]   . Acute respiratory failure with hypoxia ([place]) [J96.01] 04/23/2015  . Overdose [T50.901A] 03/23/2015  . Postextubation stridor [T85.89XA, R06.1]   . History of hepatitis C virus infection [Z86.19] 02/26/2015  . Acute respiratory failure ([place]) [J96.00]   . Altered mental status [R41.82]   . Endotracheally intubated [Z78.9]   . Acute encephalopathy [G93.40] 02/24/2015  . Drug overdose [T50.901A] 02/24/2015  . 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 Right facial pain [R51] 01/05/2015  . Orbit fracture ([place]) [S02.80XA] 11/30/2013  . Polypharmacy [Z79.899] 01/18/2012    Class: Chronic  . Bipolar affective ([place]) [F31.9] 01/18/2012  . Borderline personality disorder [F60.3] 01/18/2012  . Anxiety disorder [F41.9] 01/14/2012  . Pyelonephritis [N12] 09/16/2011  . Hypothyroidism [E03.9] 09/16/2011  . Anxiety [F41.9] 09/16/2011   History of Present Illness: Brittany [name] is a 50 years old obese Caucasian female. Admitted to [facility] from the [facility] with complaints of suicide attempt by overdose on multiple medications after a verbal urtication with fiancee. This rendered her altered mental status & inpatient hospitalization. Per record review, patient has hx of multiple suicide attempts.   During this assessment, Brittany [name] reports, "I don't know why I was involuntarily committed to the hospital. 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 It is causing  the most intense pain to the right side of my face. It has been going on x 1 year. I'm not here because I wanted to harm myself or others. I just took a bunch of pills for the pain on my face. I was not trying to hurt myself. I took 4 Nortriptyline & 6 Baclofen pills together. Then, they messed up my breathing. I think I stopped breathing entirely at some point. I feel like someone is zooming a blow touch directly on my right face all the time. It can be unbearable. I did hit this part of my face on a tree limb while walking my dog about a year ago. This accident worsened my pain. I know what I need, and it this, 2 tablets of my Fioricet & 2 mg Clonazepam together. This is the cocktail that helps my pain. I'm not depressed or suicidal, although, I was diagnosed with depression in 2008. I need to be discharged from here because I have a court date on 05-03-15. Someone accused me of pointing a gun to their face. Do not give me any antipsychotic, it makes me crazy & fat".   Associated Signs/Symptoms:  Depression Symptoms:  psychomotor agitation, difficulty concentrating,  (Hypo) Manic Symptoms:  Distractibility, Elevated Mood, Impulsivity, Labiality of Mood,  Anxiety Symptoms:  Excessive Worry,  Psychotic Symptoms:  Denies any pschotic symptoms  PTSD Symptoms: None reported  Total Time spent with patient: 1 hour  Past Psychiatric History: Bipolar disorder, depressed  Is the patient at risk to self? Yes.    Has the patient been a risk to self in the past 6 months? Yes.    Has the patient been a risk to self within the distant past?  Yes.    Is the patient a risk to others? No.  Has the patient been a risk to others in the past 6 months? No.  Has the patient been a risk to others within the distant past? No.   Prior Inpatient Therapy: Yes [facility] ) Prior Outpatient Therapy: None reported  Alcohol Screening: 1. How often do you have a drink containing alcohol?: Monthly or less 2. How many  drinks containing alcohol do you have on a typical day when you are drinking?: 1 or 2 3. How often do you have six or more drinks on one occasion?: Never Preliminary Score: 0 9. Have you or someone else been injured as a result of your drinking?: No 10. Has a relative or friend or a doctor or another health worker been concerned about your drinking or suggested you cut down?: No Alcohol Use Disorder Identification Test Final Score (AUDIT): 1 Brief Intervention: AUDIT score less than 7 or less-screening does not suggest unhealthy drinking-brief intervention not indicated  Substance Abuse History in the last 12 months:  Yes.   (Baclofen, Klonopin & Nortriptyline in a suicide attempt).  Consequences of Substance Abuse: Medical Consequences:  Liver damage, Possible death by overdose Legal Consequences:  Arrests, jail time, Loss of driving privilege. Family Consequences:  Family discord, divorce and or separation.  Previous Psychotropic Medications: Yes (Seroquel, Klonopin, Cymbalta)  Psychological Evaluations: Yes   Past Medical History:  Past Medical History  Diagnosis Date  . Hypertension   . Bipolar 1 disorder ([place])   . Anxiety   . Bulging lumbar disc   . Ruptured intervertebral disc   . Head injury   . Short-term memory loss   . Depression   . Hypothyroidism   . Goiter   . Facial pain 11/2013    walking dog when she tripped and "faceplanted" landing on her face.   . Facial nerve injury   . Hyperlipidemia   . Heart murmur   . Chronic bronchitis ([place])   . GERD (gastroesophageal reflux disease)   . Migraine     "weekly" (03/23/2015)  . Seizures ([place])     "I've had a few; had a grand mal when I took Neurotin; still take Neurotin" (03/23/2015)  . Chronic lower back pain     Past Surgical History  Procedure Laterality Date  . Tonsillectomy    . Abdominal hysterectomy    . Cesarean section  2004  . Eye surgery Right 11/2013    emergent lateral canthotomy due to proptosis   /notes 11/30/2013   Family History:  Family History  Problem Relation Age of Onset  . Migraines Mother   . Migraines Father   . Other Father   . Migraines Sister    Family Psychiatric  History: Denies any family hx of mental illness  Tobacco Screening: Denies use tobacco products  Social History:  History  Alcohol Use  . Yes    Comment: 03/23/2015 "might drink at Christmastime"     History  Drug Use  . Yes  . Special: Marijuana    Comment: 03/23/2015 ""haven't smoked marijuana for years"    Additional Social History:  Allergies:   Allergies  Allergen Reactions  . Toradol [Ketorolac Tromethamine] Swelling  . Suboxone [Buprenorphine Hcl-Naloxone Hcl]     cellulitis   . Tegretol [Carbamazepine] Rash  . Trazodone And Nefazodone Other (See Comments)    Bladder infection   Lab Results:  Results for orders placed or performed during the hospital encounter of 04/27/15 (  from the past 48 hour(s))  Comprehensive metabolic panel     Status: Abnormal   Collection Time: 04/28/15  6:25 AM  Result Value Ref Range   Sodium 138 135 - 145 mmol/L   Potassium 4.1 3.5 - 5.1 mmol/L   Chloride 100 (L) 101 - 111 mmol/L   CO2 28 22 - 32 mmol/L   Glucose, Bld 83 65 - 99 mg/dL   BUN 17 6 - 20 mg/dL   Creatinine, Ser 0.70 0.44 - 1.00 mg/dL   Calcium 9.1 8.9 - 10.3 mg/dL   Total Protein 7.4 6.5 - 8.1 g/dL   Albumin 4.1 3.5 - 5.0 g/dL   AST 27 15 - 41 U/L   ALT 34 14 - 54 U/L   Alkaline Phosphatase 70 38 - 126 U/L   Total Bilirubin 0.4 0.3 - 1.2 mg/dL   GFR calc non Af Amer >60 >60 mL/min   GFR calc Af Amer >60 >60 mL/min    Comment: (NOTE) The eGFR has been calculated using the CKD EPI equation. This calculation has not been validated in all clinical situations. eGFR's persistently <60 mL/min signify possible Chronic Kidney Disease.    Anion gap 10 5 - 15    Comment: Performed at [facility]   Blood Alcohol level:  Lab Results  Component Value Date   [facility] <5  04/20/2015   ETH <5 08/65/7846   Metabolic Disorder Labs:  No results found for: HGBA1C, MPG No results found for: PROLACTIN Lab Results  Component Value Date   TRIG 292* 03/03/2015   Current Medications: Current Facility-Administered Medications  Medication Dose Route Frequency Provider Last Rate Last Dose  . acetaminophen (TYLENOL) tablet 650 mg  650 mg Oral Q6H PRN Laverle Hobby, PA-C      . alum & mag hydroxide-simeth (MAALOX/MYLANTA) 200-200-20 MG/5ML suspension 30 mL  30 mL Oral Q4H PRN Laverle Hobby, PA-C      . butalbital-acetaminophen-caffeine (FIORICET, ESGIC) [id number] MG per tablet 1 tablet  1 tablet Oral Q4H PRN Laverle Hobby, PA-C   1 tablet at 04/28/15 0753  . cholecalciferol (VITAMIN D) tablet 1,000 Units  1,000 Units Oral QODAY Laverle Hobby, PA-C   1,000 Units at 04/27/15 2054  . clonazePAM (KLONOPIN) tablet 1 mg  1 mg Oral TID PRN Laverle Hobby, PA-C   1 mg at 04/28/15 0925  . dextromethorphan-guaiFENesin (MUCINEX DM) 30-600 MG per 12 hr tablet 1 tablet  1 tablet Oral BID PRN Laverle Hobby, PA-C      . DULoxetine (CYMBALTA) DR capsule 60 mg  60 mg Oral Daily Laverle Hobby, PA-C   60 mg at 04/28/15 0749  . folic acid (FOLVITE) tablet 1 mg  1 mg Oral Daily Laverle Hobby, PA-C   1 mg at 04/28/15 0749  . furosemide (LASIX) tablet 20 mg  20 mg Oral BID Laverle Hobby, PA-C   20 mg at 04/28/15 0749  . gabapentin (NEURONTIN) capsule 600 mg  600 mg Oral TID Laverle Hobby, PA-C   600 mg at 04/28/15 0749  . levothyroxine (SYNTHROID, LEVOTHROID) tablet 100 mcg  100 mcg Oral QAC breakfast Laverle Hobby, PA-C   100 mcg at 04/28/15 4132  . magnesium hydroxide (MILK OF MAGNESIA) suspension 30 mL  30 mL Oral Daily PRN Laverle Hobby, PA-C      . [START ON 04/29/2015] multivitamin with minerals tablet 1 tablet  1 tablet Oral QODAY Jenne Campus, MD      .  pantoprazole (PROTONIX) EC tablet 40 mg  40 mg Oral BID Laverle Hobby, PA-C   40 mg at 04/27/15 2056  .  pravastatin (PRAVACHOL) tablet 40 mg  40 mg Oral Daily Laverle Hobby, PA-C   40 mg at 04/28/15 0849  . promethazine (PHENERGAN) tablet 25 mg  25 mg Oral Q8H PRN Laverle Hobby, PA-C      . QUEtiapine (SEROQUEL) tablet 50 mg  50 mg Oral QHS,MR X 1 Laverle Hobby, PA-C   50 mg at 04/27/15 2054  . thiamine (VITAMIN B-1) tablet 100 mg  100 mg Oral Daily Laverle Hobby, PA-C   100 mg at 04/28/15 0749  . vitamin B-12 (CYANOCOBALAMIN) tablet 1,000 mcg  1,000 mcg Oral Daily Laverle Hobby, PA-C   1,000 mcg at 04/28/15 1751   PTA Medications: Prescriptions prior to admission  Medication Sig Dispense Refill Last Dose  . acetaminophen (TYLENOL) 325 MG tablet Take 2 tablets (650 mg total) by mouth every 6 (six) hours as needed for mild pain, moderate pain or headache.     . butalbital-acetaminophen-caffeine (FIORICET, ESGIC) 50-325-40 MG tablet Take 1 tablet by mouth every 4 (four) hours as needed for headache or migraine.     . Cholecalciferol (VITAMIN D PO) Take 1 tablet by mouth every other day.   Past Week at Unknown time  . clonazePAM (KLONOPIN) 1 MG tablet TK 1 T PO QID  0   . cyanocobalamin 1000 MCG tablet Take 1,000 mcg by mouth daily.   03/23/2015 at Unknown time  . dextromethorphan-guaiFENesin (MUCINEX DM) 30-600 MG 12hr tablet Take 1 tablet by mouth 2 (two) times daily as needed for cough.     . DULoxetine (CYMBALTA) 60 MG capsule Take 60 mg by mouth daily.     . folic acid (FOLVITE) 1 MG tablet Take 1 tablet (1 mg total) by mouth daily.     . furosemide (LASIX) 20 MG tablet Take 1 tablet (20 mg total) by mouth 2 (two) times daily. 30 tablet    . gabapentin (NEURONTIN) 300 MG capsule Take 2 capsules (600 mg total) by mouth 3 (three) times daily. (Patient not taking: Reported on 04/20/2015)   Not Taking at    . levothyroxine (SYNTHROID, LEVOTHROID) 100 MCG tablet Take 100 mcg by mouth daily before breakfast.     . Multiple Vitamin (MULTIVITAMIN WITH MINERALS) TABS tablet Take 1 tablet by mouth  every other day.   Past Week at Unknown time  . pantoprazole (PROTONIX) 40 MG tablet Take 1 tablet (40 mg total) by mouth 2 (two) times daily.   03/23/2015 at Unknown time  . pravastatin (PRAVACHOL) 40 MG tablet Take 40 mg by mouth daily.   03/23/2015 at Unknown time  . promethazine (PHENERGAN) 25 MG tablet Take 1 tablet (25 mg total) by mouth every 8 (eight) hours as needed for nausea or vomiting. 30 tablet 0   . thiamine 100 MG tablet Take 1 tablet (100 mg total) by mouth daily.     . vitamin B-12 1000 MCG tablet Take 1 tablet (1,000 mcg total) by mouth daily.      Musculoskeletal: Strength & Muscle Tone: within normal limits Gait & Station: normal Patient leans: N/A  Psychiatric Specialty Exam: Physical Exam  Constitutional: She is oriented to person, place, and time. She appears well-developed.  Obese  HENT:  Head: Normocephalic.  Eyes: Pupils are equal, round, and reactive to light.  Neck: Normal range of motion.  Cardiovascular: Normal rate.  Respiratory: Effort normal.  GI: Soft.  Genitourinary:  Denies any issues in this rea  Musculoskeletal: Normal range of motion.  Neurological: She is alert and oriented to person, place, and time.  Skin: Skin is warm and dry.  Psychiatric: Her mood appears not anxious (Denies being or feeling anxious). Her affect is labile. Her affect is not angry, not blunt and not inappropriate. Her speech is rapid and/or pressured and tangential. She is hyperactive. Thought content is delusional. Thought content is not paranoid. Cognition and memory are normal. She expresses impulsivity. She does not exhibit a depressed mood (Denies feeling or being depressed). She expresses no homicidal and no suicidal ideation. She expresses no suicidal plans and no homicidal plans.    Review of Systems  Constitutional: Negative.        Obese  Eyes: Negative.  Eye pain: Right eye bigger the left eye.  Respiratory: Negative.   Cardiovascular: Negative.    Gastrointestinal: Negative.   Genitourinary: Negative.   Musculoskeletal: Positive for myalgias (C/O right-sided facial pain).  Skin: Negative.   Neurological: Positive for headaches (Hx Migraine headaches).  Endo/Heme/Allergies: Negative.   Psychiatric/Behavioral: Positive for depression. Negative for suicidal ideas, hallucinations, memory loss and substance abuse. The patient is nervous/anxious and has insomnia.     Blood pressure 133/64, pulse 95, temperature 98.4 F (36.9 C), temperature source Oral, resp. rate 18, height 5' 4"  (1.626 m), weight 110.678 kg (244 lb).Body mass index is 41.86 kg/(m^2).  General Appearance: Casual, Obese  Eye Contact::  Fair  Speech:  Clear and Coherent and Pressured  Volume:  Increased  Mood:  Euphoric  Affect:  Full range, labile  Thought Process:  Coherent and Tangential  Orientation:  Full (Time, Place, and Person)  Thought Content:  Rumination  Suicidal Thoughts:  Denies any suicidal thoughts, plans or intent  Homicidal Thoughts:  Denies  Memory:  Immediate;   Good Recent;   Fair Remote;   Fair  Judgement:  Impaired  Insight:  Lacking  Psychomotor Activity:  Increased  Concentration:  Poor  Recall:  [employer] of Knowledge:Poor  Language: Fair  Akathisia:  No  Handed:  Right  AIMS (if indicated):     Assets:  Desire for Improvement  ADL's:  Intact  Cognition: Fairly intact  Sleep:  Number of Hours: 5.5   Treatment Plan/Recommendations: 1. Admit for crisis management and stabilization, estimated length of stay 3-5 days.  2. Medication management to reduce current symptoms to base line and improve the patient's overall level of functioning;Resumed on; Klonopin 1 mg for anxiety, Cymbalta 60 mg for depression, Gabapentin 600 mg for agitation, Seroquel 50 mg for mood control,     3. Treat health problems as indicated.  4. Develop treatment plan to decrease risk of relapse upon discharge and the need for readmission.  5. Psycho-social  education regarding relapse prevention and self care.  6. Health care follow up as needed for medical problems.  7. Review, reconcile, and reinstate any pertinent home medications for other health issues where appropriate,  Fioricet 50-325-40 mg for HA, Vitamin D for bone health, Mucinex DM 30-600 mg for cough, Folic acid 1 mg for low folate, Furosemide 20 mg for edema, Synthroid 100 mg for hypothyroidism, Protonix 40 mg for GERD, Pravastatin 40 mg for high cholesterol, Thiamine 100 mg for low thiamine, Vitamin B-12 1,00 mcg for low B-12 level. 8. Call for consults with hospitalist for any additional specialty patient care services as needed.  Observation Level/Precautions:  15  minute checks  Laboratory:  Per ED  Psychotherapy: Group sessions  Medications: Resumed on; Klonopin 1 mg for anxiety, Cymbalta 60 mg for depression, Gabapentin 600 mg for agitation, Seroquel 50 mg for mood control, Fioricet 50-325-40 mg for HA, Vitamin D for bone health, Mucinex DM 30-600 mg for cough, Folic acid 1 mg for low folate, Furosemide 20 mg for edema, Synthroid 100 mg for hypothyroidism, Protonix 40 mg for GERD, Pravastatin 40 mg for high cholesterol, Thiamine 100 mg for low thiamine, Vitamin B-12 1,00 mcg for low B-12 level.  Consultations: As needed  Discharge Concerns: Mood stability   Estimated LOS: 5-7 days  Other: Admit to [id number]    I certify that inpatient services furnished can reasonably be expected to improve the patient's condition.    Encarnacion Slates, NP, PMHNP-BC 3/15/201711:45 AM Patient case discussed with NP and patient seen by me  Agree with NP Note/Assessment  50 year old female. Recent medical admission due to lethargy, prolongued QTc, and need for intubation due to respiratory failure . Patient reports she took " several " of her medications which included baclofen and a tricyclic antidepressant. At this time denies any suicidal intent regarding this overdose and states she was simply  trying to obtain relief from chronic trigeminal neuropathy/pain, which she states gradually appeared after facial trauma , and which she states is currently severe .  Dx- Overdose, undetermined intent. 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  Plan- On Cymbalta, Neurontin, and Klonopin PRNs for agitation or severe anxiety
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
## 2015-04-28 NOTE — Progress Notes (Signed)
D-  Patient denies SI, HI and AVH this shift.  Patient has been complaining of high levels of anxiety.  Patient has also had complaints of pain to the left side of her face.  Patient has been intrusive and needed reminders about boundaries during the shift.  Patient has been hyperverbal and pressured in speech.   A- Assess for safety, offer medications as prescribed, engage patient in 1:1 therapeutic staff talks.   R-  Patient able to contract for safety.

## 2015-04-28 NOTE — B[facility] Suicide Risk Assessment (Signed)
[facility] INPATIENT:  Family/Significant Other Suicide Prevention Education  Suicide Prevention Education:  Patient Refusal for Family/Significant Other Suicide Prevention Education: The patient Brittany [name] has refused to provide written consent for family/significant other to be provided Family/Significant Other Suicide Prevention Education during admission and/or prior to discharge.  Physician notified.  Sallee LangeCunningham, Anne C 04/28/2015, 12:01 PM

## 2015-04-28 NOTE — [facility] Suicide Risk Assessment (Addendum)
[facility]   Nursing information obtained from:   patient and chart  Demographic factors:   50 year old female  Current Mental Status:   see below  Loss Factors:   chronic facial pain  Historical Factors:   has been diagnosed with Bipolar Disorder and Borderline Personality Disorder in the past  Risk Reduction Factors:   resilience   Total Time spent with patient: 45 minutes Principal Problem: overdose - undetermined intent  Diagnosis:   Patient Active Problem List   Diagnosis Date Noted  . Bipolar 2 disorder, major depressive episode (HCC) [F31.81] 04/27/2015  . Morbid obesity due to excess calories (HCC) [E66.01]   . Esophageal reflux [K21.9]   . Migraine without aura and without status migrainosus, not intractable [G43.009]   . Acute respiratory failure with hypoxia (HCC) [J96.01] 04/23/2015  . Overdose [T50.901A] 03/23/2015  . Postextubation stridor [T85.89XA, R06.1]   . History of hepatitis C virus infection [Z86.19] 02/26/2015  . Acute respiratory failure (HCC) [J96.00]   . Altered mental status [R41.82]   . Endotracheally intubated [Z78.9]   . Acute encephalopathy [G93.40] 02/24/2015  . Drug overdose [T50.901A] 02/24/2015  . 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 Right facial pain [R51] 01/05/2015  . Orbit fracture (HCC) [S02.80XA] 11/30/2013  . Polypharmacy [Z79.899] 01/18/2012    Class: Chronic  . Bipolar affective (HCC) [F31.9] 01/18/2012  . Borderline personality disorder [F60.3] 01/18/2012  . Anxiety disorder [F41.9] 01/14/2012  . Pyelonephritis [N12] 09/16/2011  . Hypothyroidism [E03.9] 09/16/2011  . Anxiety [F41.9] 09/16/2011     Continued Clinical Symptoms:  Alcohol Use Disorder Identification Test Final Score (AUDIT): 1 The "Alcohol Use Disorders Identification Test", Guidelines for Use in Primary Care, Second Edition.  World Science writerHealth Organization [facility](WHO). Score between 0-7:  no or low risk or alcohol related problems. Score between  8-15:  moderate risk of alcohol related problems. Score between 16-19:  high risk of alcohol related problems. Score 20 or above:  warrants further diagnostic evaluation for alcohol dependence and treatment.   CLINICAL FACTORS:  50 year old female. Recent medical admission due to lethargy, prolongued QTc, and need for intubation due to respiratory failure . Patient reports she took " several " of her medications which included baclofen and a tricyclic antidepressant. At this time denies any suicidal intent regarding this overdose and states she was simply trying to obtain relief from chronic trigeminal neuropathy/pain, which she states gradually appeared after facial trauma , and which she states is currently severe .  Dx- Overdose, undetermined intent. 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  Plan- On Cymbalta, Neurontin, and Klonopin PRNs for agitation or severe anxiety   Musculoskeletal: Strength & Muscle Tone: within normal limits Gait & Station: normal Patient leans: N/A  Psychiatric Specialty Exam: ROS chronic R facial pain  Blood pressure 133/64, pulse 95, temperature 98.4 F (36.9 C), temperature source Oral, resp. rate 18, height 5\' 4"  (1.626 m), weight 244 lb (110.678 kg).Body mass index is 41.86 kg/(m^2).  General Appearance: Fairly Groomed  Patent attorneyye Contact::  Good- anisocoria noted, which she states is chronic and related to facial trauma   Speech:  Normal   Volume:  Normal  Mood:  Anxious and Depressed  Affect:  Constricted  Thought Process:  Linear  Orientation:  Other:  fully alert and attentive   Thought Content:  ruminative about pain and analgesic medications, denies hallucinations, no delusions expressed   Suicidal Thoughts:  denies SI and states recent overdose  was not suicidal in intent   Homicidal Thoughts:  No  Memory:  recent and remote grossly intact   Judgement:  Fair  Insight:  Fair  Psychomotor Activity:  Normal  Concentration:   Good  Recall:  Good  Fund of Knowledge:Good  Language: Good  Akathisia:  Negative  Handed:  Right  AIMS (if indicated):     Assets:  Desire for Improvement Resilience  Sleep:  Number of Hours: 5.5  Cognition: WNL  ADL's:  Intact    COGNITIVE FEATURES THAT CONTRIBUTE TO RISK:  Closed-mindedness and Loss of executive function    SUICIDE RISK:   Moderate:  Frequent suicidal ideation with limited intensity, and duration, some specificity in terms of plans, no associated intent, good self-control, limited dysphoria/symptomatology, some risk factors present, and identifiable protective factors, including available and accessible social support.  PLAN OF CARE: Patient will be admitted to inpatient psychiatric unit for stabilization and safety. Will provide and encourage milieu participation. Provide medication management and maked adjustments as needed.  Will follow daily.    I certify that inpatient services furnished can reasonably be expected to improve the patient's condition.   Nehemiah Massed, MD 04/28/2015, 4:51 PM

## 2015-04-29 DIAGNOSIS — F3163 Bipolar disorder, current episode mixed, severe, without psychotic features: Secondary | ICD-10-CM | POA: Diagnosis present

## 2015-04-29 MED ORDER — ZIPRASIDONE HCL 20 MG PO CAPS
20.0000 mg | ORAL_CAPSULE | Freq: Two times a day (BID) | ORAL | Status: DC
  Administered 2015-04-29 – 2015-05-01 (×4): 20 mg via ORAL
  Filled 2015-04-29 (×8): qty 1

## 2015-04-29 MED ORDER — CLONAZEPAM 0.5 MG PO TABS
0.5000 mg | ORAL_TABLET | Freq: Four times a day (QID) | ORAL | Status: DC | PRN
  Administered 2015-04-29 – 2015-05-03 (×14): 0.5 mg via ORAL
  Filled 2015-04-29 (×15): qty 1

## 2015-04-29 MED ORDER — ZOLPIDEM TARTRATE 5 MG PO TABS
5.0000 mg | ORAL_TABLET | Freq: Every day | ORAL | Status: DC
  Administered 2015-04-29 – 2015-05-02 (×4): 5 mg via ORAL
  Filled 2015-04-29 (×4): qty 1

## 2015-04-29 NOTE — Progress Notes (Signed)
Adult Psychoeducational Group Note  Date:  04/29/2015 Time:  9:12 PM  Group Topic/Focus:  Wrap-Up Group:   The focus of this group is to help patients review their daily goal of treatment and discuss progress on daily workbooks.  Participation Level:  Active  Participation Quality:  Sharing  Affect:  Appropriate  Cognitive:  Appropriate  Insight: Good  Engagement in Group:  Engaged  Modes of Intervention:  Activity  Additional Comments:  Patient rated her day a 5. Said she is focused on getting a mood stabilizer and to be less depressed and staying focused.  Claria DiceKiara M Kristel [name] 04/29/2015, 9:12 PM

## 2015-04-29 NOTE — Progress Notes (Signed)
D- Patient has been trying to find some relief from pain this shift.  Patient has needed support of pain medication throughout the day.  Patient reports feeling anxious due to pain medications.  Patient denies SI, HI and AVH but is very somatic.  A-  Assess patient for safety, offer medications as prescribed, encourage patient to use positive coping skills, assess for pain.  R-  Patient able to contract for safety.

## 2015-04-29 NOTE — Progress Notes (Signed)
[facility] MD Progress Note  04/29/2015 2:08 PM Brittany [name]  MRN:  [id number] Subjective:  Pt states " I was in a lot of pain, I did not try to kill myself, I just took 1 baclofen and I passed out. It was some kind of drug reaction this time. I am in a lot of pain all the time. I am going to have a procedure done by my neurologist , I am looking forward to it."  Objective:Brittany [name] is a 50 years old obese Caucasian female, who has a past hx of Bipolar disorder as well as multiple medical problems including trigeminal neuralgia , who lives in [place] was admitted to [facility] from the [facility] with complaints of suicide attempt by overdose on multiple medications . Pt per initial notes in EHR was treated for possible TCA OD .  Patient seen and chart reviewed.Discussed patient with treatment team.  Pt today continues to present as anxious and ruminates about her pain. Pt reports that she did not try to OD , but that it was a bad reaction to her baclofen.  Pt seen as restless , anxious . Reports she is allergic to a lot of medications including depakote, tegretol, lamictal . Pt is agreeable to be started on Geodon, has tried it in the past with good effects, will start the same to help stabilize her mood sx.          Principal Problem: Bipolar disorder, curr episode mixed, severe, w/o psychotic features ([place]) Diagnosis:   Patient Active Problem List   Diagnosis Date Noted  . Bipolar disorder, curr episode mixed, severe, w/o psychotic features ([place]) [F31.63] 04/29/2015  . Morbid obesity due to excess calories ([place]) [E66.01]   . Esophageal reflux [K21.9]   . Migraine without aura and without status migrainosus, not intractable [G43.009]   . Acute respiratory failure with hypoxia ([place]) [J96.01] 04/23/2015  . Overdose [T50.901A] 03/23/2015  . Postextubation stridor [T85.89XA, R06.1]   . History of hepatitis C virus infection [Z86.19] 02/26/2015  . Acute respiratory failure ([place]) [J96.00]   .  Endotracheally intubated [Z78.9]   . Acute encephalopathy [G93.40] 02/24/2015  . Drug overdose [T50.901A] 02/24/2015  . 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 Right facial pain [R51] 01/05/2015  . Orbit fracture ([place]) [S02.80XA] 11/30/2013  . Polypharmacy [Z79.899] 01/18/2012    Class: Chronic  . Borderline personality disorder [F60.3] 01/18/2012  . Pyelonephritis [N12] 09/16/2011  . Hypothyroidism [E03.9] 09/16/2011   Total Time spent with patient: 30 minutes  Past Psychiatric History: Pt with past hx of Bipolar disorder/ borderline personality do, has had multiple hospitalizations in the past for mood sx as well as for OD. Pt was admitted most recently three times in three months in 2017 for severe OD on medications including her TCA as well as baclofen.   Past Medical History:  Past Medical History  Diagnosis Date  . Hypertension   . Bipolar 1 disorder ([place])   . Anxiety   . Bulging lumbar disc   . Ruptured intervertebral disc   . Head injury   . Short-term memory loss   . Depression   . Hypothyroidism   . Goiter   . Facial pain 11/2013    walking dog when she tripped and "faceplanted" landing on her face.   . Facial nerve injury   . Hyperlipidemia   . Heart murmur   . Chronic bronchitis ([place])   . GERD (gastroesophageal reflux disease)   . Migraine     "weekly" (  03/23/2015)  . Seizures ([place])     "I've had a few; had a grand mal when I took Neurotin; still take Neurotin" (03/23/2015)  . Chronic lower back pain     Past Surgical History  Procedure Laterality Date  . Tonsillectomy    . Abdominal hysterectomy    . Cesarean section  2004  . Eye surgery Right 11/2013    emergent lateral canthotomy due to proptosis  /notes 11/30/2013   Family History:  Family History  Problem Relation Age of Onset  . Migraines Mother   . Migraines Father   . Other Father   . Migraines Sister    Family Psychiatric  History: As per H&p. Social History: As per H&p History  Alcohol Use   . Yes    Comment: 03/23/2015 "might drink at Christmastime"     History  Drug Use  . Yes  . Special: Marijuana    Comment: 03/23/2015 ""haven't smoked marijuana for years"    Social History   Social History  . Marital Status: Married    Spouse Name: N/A  . Number of Children: 1  . Years of Education: 16   Occupational History  . Unemployed    Social History Main Topics  . Smoking status: Former Smoker    Types: Cigarettes  . Smokeless tobacco: Never Used     Comment: "smoked cigarettes when I was 50 years old"  . Alcohol Use: Yes     Comment: 03/23/2015 "might drink at Christmastime"  . Drug Use: Yes    Special: Marijuana     Comment: 03/23/2015 ""haven't smoked marijuana for years"  . Sexual Activity: Yes    Birth Control/ Protection: Condom   Other Topics Concern  . None   Social History Narrative   Lives at home with her fiancee.   Right-handed.   No caffeine use.   Additional Social History:                         Sleep: Fair  Appetite:  Fair  Current Medications: Current Facility-Administered Medications  Medication Dose Route Frequency Provider Last Rate Last Dose  . acetaminophen (TYLENOL) tablet 650 mg  650 mg Oral Q6H PRN Laverle Hobby, PA-C   650 mg at 04/29/15 1036  . alum & mag hydroxide-simeth (MAALOX/MYLANTA) 200-200-20 MG/5ML suspension 30 mL  30 mL Oral Q4H PRN Laverle Hobby, PA-C      . butalbital-acetaminophen-caffeine (FIORICET, ESGIC) 5183717624 MG per tablet 1 tablet  1 tablet Oral Q4H PRN Laverle Hobby, PA-C   1 tablet at 04/29/15 1321  . cholecalciferol (VITAMIN D) tablet 1,000 Units  1,000 Units Oral QODAY Laverle Hobby, PA-C   1,000 Units at 04/29/15 7741  . clonazePAM (KLONOPIN) tablet 0.5 mg  0.5 mg Oral QID PRN Ursula Alert, MD   0.5 mg at 04/29/15 1320  . dextromethorphan-guaiFENesin (MUCINEX DM) 30-600 MG per 12 hr tablet 1 tablet  1 tablet Oral BID PRN Laverle Hobby, PA-C      . DULoxetine (CYMBALTA) DR capsule 60  mg  60 mg Oral Daily Laverle Hobby, PA-C   60 mg at 04/29/15 2878  . folic acid (FOLVITE) tablet 1 mg  1 mg Oral Daily Laverle Hobby, PA-C   1 mg at 04/29/15 6767  . furosemide (LASIX) tablet 20 mg  20 mg Oral BID Laverle Hobby, PA-C   20 mg at 04/29/15 2094  . gabapentin (NEURONTIN) capsule 600  mg  600 mg Oral TID Laverle Hobby, PA-C   600 mg at 04/29/15 1320  . levothyroxine (SYNTHROID, LEVOTHROID) tablet 100 mcg  100 mcg Oral QAC breakfast Laverle Hobby, PA-C   100 mcg at 04/29/15 1610  . magnesium hydroxide (MILK OF MAGNESIA) suspension 30 mL  30 mL Oral Daily PRN Laverle Hobby, PA-C      . multivitamin with minerals tablet 1 tablet  1 tablet Oral QODAY Jenne Campus, MD   1 tablet at 04/29/15 0949  . pantoprazole (PROTONIX) EC tablet 40 mg  40 mg Oral BID Laverle Hobby, PA-C   40 mg at 04/27/15 2056  . pravastatin (PRAVACHOL) tablet 40 mg  40 mg Oral Daily Laverle Hobby, PA-C   40 mg at 04/28/15 0849  . promethazine (PHENERGAN) tablet 25 mg  25 mg Oral Q8H PRN Laverle Hobby, PA-C   25 mg at 04/28/15 1721  . thiamine (VITAMIN B-1) tablet 100 mg  100 mg Oral Daily Laverle Hobby, PA-C   100 mg at 04/29/15 9604  . vitamin B-12 (CYANOCOBALAMIN) tablet 1,000 mcg  1,000 mcg Oral Daily Laverle Hobby, PA-C   1,000 mcg at 04/29/15 5409  . ziprasidone (GEODON) capsule 20 mg  20 mg Oral BID WC Pallavi Clifton, MD      . zolpidem (AMBIEN) tablet 5 mg  5 mg Oral QHS Ursula Alert, MD        Lab Results:  Results for orders placed or performed during the hospital encounter of 04/27/15 (from the past 48 hour(s))  Comprehensive metabolic panel     Status: Abnormal   Collection Time: 04/28/15  6:25 AM  Result Value Ref Range   Sodium 138 135 - 145 mmol/L   Potassium 4.1 3.5 - 5.1 mmol/L   Chloride 100 (L) 101 - 111 mmol/L   CO2 28 22 - 32 mmol/L   Glucose, Bld 83 65 - 99 mg/dL   BUN 17 6 - 20 mg/dL   Creatinine, Ser 0.70 0.44 - 1.00 mg/dL   Calcium 9.1 8.9 - 10.3 mg/dL    Total Protein 7.4 6.5 - 8.1 g/dL   Albumin 4.1 3.5 - 5.0 g/dL   AST 27 15 - 41 U/L   ALT 34 14 - 54 U/L   Alkaline Phosphatase 70 38 - 126 U/L   Total Bilirubin 0.4 0.3 - 1.2 mg/dL   GFR calc non Af Amer >60 >60 mL/min   GFR calc Af Amer >60 >60 mL/min    Comment: (NOTE) The eGFR has been calculated using the CKD EPI equation. This calculation has not been validated in all clinical situations. eGFR's persistently <60 mL/min signify possible Chronic Kidney Disease.    Anion gap 10 5 - 15    Comment: Performed at [facility]    Blood Alcohol level:  Lab Results  Component Value Date   [facility] <5 04/20/2015   ETH <5 03/23/2015    Physical Findings: AIMS: Facial and Oral Movements Muscles of Facial Expression: None, normal Lips and Perioral Area: None, normal Jaw: None, normal Tongue: None, normal,Extremity Movements Upper (arms, wrists, hands, fingers): None, normal Lower (legs, knees, ankles, toes): None, normal, Trunk Movements Neck, shoulders, hips: None, normal, Overall Severity Severity of abnormal movements (highest score from questions above): None, normal Incapacitation due to abnormal movements: None, normal Patient's awareness of abnormal movements (rate only patient's report): No Awareness, Dental Status Current problems with teeth and/or dentures?: No Does patient  usually wear dentures?: No  CIWA:    COWS:     Musculoskeletal: Strength & Muscle Tone: within normal limits Gait & Station: normal Patient leans: N/A  Psychiatric Specialty Exam: Review of Systems  HENT:       Right sided facial pain  Musculoskeletal: Positive for myalgias.  Neurological: Positive for headaches.  Psychiatric/Behavioral: The patient is nervous/anxious.   All other systems reviewed and are negative.   Blood pressure 128/83, pulse 82, temperature 97.7 F (36.5 C), temperature source Oral, resp. rate 14, height 5' 4"  (1.626 m), weight 110.678 kg (244 lb).Body  mass index is 41.86 kg/(m^2).  General Appearance: Fairly Groomed  Engineer, water::  Fair  Speech:  Normal Rate  Volume:  Normal  Mood:  Anxious  Affect:  Flat  Thought Process:  Goal Directed  Orientation:  Full (Time, Place, and Person)  Thought Content:  Rumination  Suicidal Thoughts:  No  Homicidal Thoughts:  No  Memory:  Immediate;   Fair Recent;   Fair Remote;   Fair  Judgement:  Impaired  Insight:  Shallow  Psychomotor Activity:  Restlessness  Concentration:  Poor  Recall:  [employer] of Knowledge:Fair  Language: Fair  Akathisia:  No  Handed:  Right  AIMS (if indicated):     Assets:  Communication Skills  ADL's:  Intact  Cognition: WNL  Sleep:  Number of Hours: 6.25   Treatment Plan Summary::Brittany [name] is a 50 years old obese Caucasian female, who has a past hx of Bipolar disorder as well as multiple medical problems including trigeminal neuralgia , who lives in [place] was admitted to [facility] from the [facility] with complaints of suicide attempt by overdose on multiple medications . Pt per initial notes in EHR was treated for possible TCA OD . Pt today continues to be restless, will continue treatment. Daily contact with patient to assess and evaluate symptoms and progress in treatment and Medication management   Reviewed past medical records,treatment plan. I have reviewed H&p. Will start a trial of Geodon 20 mg po bid for mood sx. EKG reviewed - qtc wnl. Will change Klonopin to 0.5 mg po qid prn for anxiety/muscle spasms. Will also continue Gabapentin 600 mg po tid for mood sx, pain. Will start Ambien 5 mg po qhs for sleep. Will DC Seroquel since pt reports ADRs in the past. Will restart home medications as per agitation protocol. Will continue to monitor vitals ,medication compliance and treatment side effects while patient is here.  Will monitor for medical issues as well as call consult as needed.  Reviewed labs UDS - shows stimulants pos, BZD pos ,will need to  explore more about stimulant abuse.  Will also get TSH, lipid panel, hba1c, pl if not already done.  CSW will continue working on disposition.  Patient to participate in therapeutic milieu .       Brittany Brunet, MD 04/29/2015, 2:08 PM

## 2015-04-29 NOTE — [facility] Group Notes (Signed)
[facility] LCSW Group Therapy  04/29/2015 1:15 pm  Type of Therapy: Process Group Therapy  Participation Level:  Active  Participation Quality:  Appropriate  Affect:  Flat  Cognitive:  Oriented  Insight:  Improving  Engagement in Group:  Limited  Engagement in Therapy:  Limited  Modes of Intervention:  Activity, Clarification, Education, Problem-solving and Support  Summary of Progress/Problems: Today's group addressed the issue of overcoming obstacles.  Patients were asked to identify their biggest obstacle post d/c that stands in the way of their on-going success, and then problem solve as to how to manage this.  Came to group late.  Settled in without disrupting-was coloring.  When invited to share, talked about her fear of the procedure to allieve pain as there are possible side affects.  "But I can't continue like this, and I sure can't continue to take medications because look what happens when I do that-I almost died three times."  Shared that reading, warm baths and coloring all help her cope to some extent.  Daryel Geraldorth, Cleston Lautner B 04/29/2015   2:40 PM

## 2015-04-29 NOTE — Progress Notes (Signed)
D: Pt endorses mild anxiety and depression. Pt states, "I have been thinking of my daughter and that have really made me depressed and then anxious; they are way better than what they use to be." Pt also complained of mild left jaw pain. Pt denies SI, HI, and AVH. Pt could be attentions seeking. Pt remained cooperative, and nonviolent through the shift assessment. A: Medications offered as prescribed.  Support, encouragement, and safe environment provided.  15-minute safety checks continue. R: Pt was med compliant.  Pt attended wrap-up group. Safety checks continue.

## 2015-04-30 LAB — LIPID PANEL
CHOL/HDL RATIO: 4.9 ratio
CHOLESTEROL: 199 mg/dL (ref 0–200)
HDL: 41 mg/dL (ref >40)
LDL Cholesterol: 105 mg/dL — ABNORMAL HIGH (ref 0–99)
TRIGLYCERIDES: 266 mg/dL — AB (ref <150)
VLDL: 53 mg/dL — AB (ref 0–40)

## 2015-04-30 MED ORDER — BUTALBITAL-APAP-CAFFEINE 50-325-40 MG PO TABS
1.0000 | ORAL_TABLET | Freq: Four times a day (QID) | ORAL | Status: DC | PRN
  Administered 2015-04-30 – 2015-05-03 (×10): 1 via ORAL
  Filled 2015-04-30 (×10): qty 1

## 2015-04-30 NOTE — Progress Notes (Signed)
Recreation Therapy Notes  03.17.2017 Order for recreation therapy services received and acknowledged. LRT attempted to meet with patient at approximately 2:30pm. Patient currently sleeping. LRT will continue to attempt to work 1:1 with patient during admission.   Marykay Lexenise L Vollie Brunty, LRT/CTRS  Jearl KlinefelterBlanchfield, Florentina Marquart L 04/30/2015 3:54 PM

## 2015-04-30 NOTE — Progress Notes (Signed)
Pt reports sleeping much better with the Ambien she received last night.  She slept 6.75 hours according to the flowsheet.

## 2015-04-30 NOTE — Progress Notes (Signed)
Brittany [name] remains manic in her behavior. She is intrusive. She demands things. SHe can fixate on medicaitons as opposed to modulating behaviors. A SHe  completed her daily assessment and on it she wrote she deneid SI today and she rated her depression, hopelessness and anxiety " 5/0/7", respectively. She complains of frequent pain with her trigeminal nueralgia ( and is getting around the clock fioricet and klonopin). She is seen laughing...joking...discussing clothes, children and makeup several times thoughout the day,....with staff and other patients. R Safety is in place.

## 2015-04-30 NOTE — Plan of Care (Signed)
Problem: Diagnosis: Increased Risk For Suicide Attempt Goal: STG-Patient Will Attend All Groups On The Unit Outcome: Progressing Pt attended evening group on 04/29/15

## 2015-04-30 NOTE — Progress Notes (Signed)
Adult Psychoeducational Group Note  Date:  04/30/2015 Time:  8:35 PM  Group Topic/Focus:  Wrap-Up Group:   The focus of this group is to help patients review their daily goal of treatment and discuss progress on daily workbooks.  Participation Level:  Active  Participation Quality:  Appropriate  Affect:  Appropriate  Cognitive:  Alert  Insight: Appropriate  Engagement in Group:  Engaged  Modes of Intervention:  Activity  Additional Comments:  Patient rated her day an 8. Goal is to be stable.  Natasha MeadKiara M Indiana Pechacek 04/30/2015, 8:35 PM

## 2015-04-30 NOTE — [facility] Group Notes (Signed)
[facility] LCSW Group Therapy  04/30/2015  1:05 PM  Type of Therapy:  Group therapy  Participation Level:  Active  Participation Quality:  Attentive  Affect:  Flat  Cognitive:  Oriented  Insight:  Limited  Engagement in Therapy:  Limited  Modes of Intervention:  Discussion, Socialization  Summary of Progress/Problems:  Chaplain was here to lead a group on themes of hope and courage." I agree with what that other woman was saying."  Minimal interaction.  Coloring.  Limited insight.  Daryel Geraldorth, Shimshon Narula B 04/30/2015 1:32 PM

## 2015-04-30 NOTE — Progress Notes (Signed)
D:Patient in hallway on approach.  Patient states she had a good day.  Patient states she plans to be discharged soon.  Patient she has had periods of anxiety today and states she has facial pain.  Patient states her goal was to be positive today.  Patient states she was able to meet her goal.  Patient states her fiance visited today and states it was a great visit.  Patient states she was happy to see him.  Patient denies SI/HI and denies AVH. A: Staff to monitor Q 15 mins for safety.  Encouragement and support offered.  Scheduled medications administered per orders.  Fioricet administered prn for pain.  Klonopin administered prn for anxiety. R: Patient remains safe on the unit.  Patient attended group tonight.  Patient visible on the unit and interacting with peers.  Patient taking administered medications.

## 2015-04-30 NOTE — Progress Notes (Signed)
[facility] MD Progress Note  04/30/2015 11:25 AM Brittany [name]  MRN:  [id number] Subjective:  Pt states " I feel better today , I am still anxious , I am still in pain, but I do understand why I ended up in the hospital.'   Objective:Brittany [name] is a 50 years old obese Caucasian female, who has a past hx of Bipolar disorder as well as multiple medical problems including trigeminal neuralgia , who lives in [place] was admitted to [facility] from the [facility] with complaints of suicide attempt by overdose on multiple medications . Pt per initial notes in EHR was treated for possible TCA OD .  Patient seen and chart reviewed.Discussed patient with treatment team.  Pt today continues to present as anxious, although progressing. Pt continues to be medication seeking , is always in pain , does have trigeminal neuralgia which seems to be frustrating. Pt reports she tried to take a lot of medications with the intention of helping her pain sx, but she ended up emergency departments and required intubation and admission atleast 3 times recently. Pt reports geodon as helpful , but wants her dosage to be increased to help with her anxiety sx/mood sx.         Principal Problem: Bipolar disorder, curr episode mixed, severe, w/o psychotic features (HCC) Diagnosis:   Patient Active Problem List   Diagnosis Date Noted  . Bipolar disorder, curr episode mixed, severe, w/o psychotic features (HCC) [F31.63] 04/29/2015  . Morbid obesity due to excess calories (HCC) [E66.01]   . Esophageal reflux [K21.9]   . Migraine without aura and without status migrainosus, not intractable [G43.009]   . Acute respiratory failure with hypoxia (HCC) [J96.01] 04/23/2015  . Overdose [T50.901A] 03/23/2015  . Postextubation stridor [T85.89XA, R06.1]   . History of hepatitis C virus infection [Z86.19] 02/26/2015  . Acute respiratory failure (HCC) [J96.00]   . Endotracheally intubated [Z78.9]   . Acute encephalopathy [G93.40]  02/24/2015  . Drug overdose [T50.901A] 02/24/2015  . 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 Right facial pain [R51] 01/05/2015  . Orbit fracture (HCC) [S02.80XA] 11/30/2013  . Polypharmacy [Z79.899] 01/18/2012    Class: Chronic  . Borderline personality disorder [F60.3] 01/18/2012  . Pyelonephritis [N12] 09/16/2011  . Hypothyroidism [E03.9] 09/16/2011   Total Time spent with patient: 30 minutes  Past Psychiatric History: Pt with past hx of Bipolar disorder/ borderline personality do, has had multiple hospitalizations in the past for mood sx as well as for OD. Pt was admitted most recently three times in three months in 2017 for severe OD on medications including her TCA as well as baclofen.   Past Medical History:  Past Medical History  Diagnosis Date  . Hypertension   . Bipolar 1 disorder (HCC)   . Anxiety   . Bulging lumbar disc   . Ruptured intervertebral disc   . Head injury   . Short-term memory loss   . Depression   . Hypothyroidism   . Goiter   . Facial pain 11/2013    walking dog when she tripped and "faceplanted" landing on her face.   . Facial nerve injury   . Hyperlipidemia   . Heart murmur   . Chronic bronchitis (HCC)   . GERD (gastroesophageal reflux disease)   . Migraine     "weekly" (03/23/2015)  . Seizures (HCC)     "I've had a few; had a grand mal when I took Neurotin; still take Neurotin" (03/23/2015)  . Chronic lower back pain  Past Surgical History  Procedure Laterality Date  . Tonsillectomy    . Abdominal hysterectomy    . Cesarean section  2004  . Eye surgery Right 11/2013    emergent lateral canthotomy due to proptosis  /notes 11/30/2013   Family History:  Family History  Problem Relation Age of Onset  . Migraines Mother   . Migraines Father   . Other Father   . Migraines Sister    Family Psychiatric  History: As per H&p. Social History: As per H&p History  Alcohol Use  . Yes    Comment: 03/23/2015 "might drink at Christmastime"      History  Drug Use  . Yes  . Special: Marijuana    Comment: 03/23/2015 ""haven't smoked marijuana for years"    Social History   Social History  . Marital Status: Married    Spouse Name: N/A  . Number of Children: 1  . Years of Education: 16   Occupational History  . Unemployed    Social History Main Topics  . Smoking status: Former Smoker    Types: Cigarettes  . Smokeless tobacco: Never Used     Comment: "smoked cigarettes when I was 50 years old"  . Alcohol Use: Yes     Comment: 03/23/2015 "might drink at Christmastime"  . Drug Use: Yes    Special: Marijuana     Comment: 03/23/2015 ""haven't smoked marijuana for years"  . Sexual Activity: Yes    Birth Control/ Protection: Condom   Other Topics Concern  . None   Social History Narrative   Lives at home with her fiancee.   Right-handed.   No caffeine use.   Additional Social History:                         Sleep: Fair  Appetite:  Fair  Current Medications: Current Facility-Administered Medications  Medication Dose Route Frequency Provider Last Rate Last Dose  . acetaminophen (TYLENOL) tablet 650 mg  650 mg Oral Q6H PRN Kerry Hough, PA-C   650 mg at 04/29/15 1820  . alum & mag hydroxide-simeth (MAALOX/MYLANTA) 200-200-20 MG/5ML suspension 30 mL  30 mL Oral Q4H PRN Kerry Hough, PA-C      . butalbital-acetaminophen-caffeine (FIORICET, ESGIC) [phone] MG per tablet 1 tablet  1 tablet Oral Q4H PRN Kerry Hough, PA-C   1 tablet at 04/30/15 1031  . cholecalciferol (VITAMIN D) tablet 1,000 Units  1,000 Units Oral QODAY Kerry Hough, PA-C   1,000 Units at 04/29/15 1308  . clonazePAM (KLONOPIN) tablet 0.5 mg  0.5 mg Oral QID PRN Jomarie Longs, MD   0.5 mg at 04/30/15 0634  . dextromethorphan-guaiFENesin (MUCINEX DM) 30-600 MG per 12 hr tablet 1 tablet  1 tablet Oral BID PRN Kerry Hough, PA-C      . DULoxetine (CYMBALTA) DR capsule 60 mg  60 mg Oral Daily Kerry Hough, PA-C   60 mg at 04/30/15  0800  . folic acid (FOLVITE) tablet 1 mg  1 mg Oral Daily Kerry Hough, PA-C   1 mg at 04/30/15 0800  . furosemide (LASIX) tablet 20 mg  20 mg Oral BID Kerry Hough, PA-C   20 mg at 04/30/15 0801  . gabapentin (NEURONTIN) capsule 600 mg  600 mg Oral TID Kerry Hough, PA-C   600 mg at 04/30/15 0800  . levothyroxine (SYNTHROID, LEVOTHROID) tablet 100 mcg  100 mcg Oral QAC breakfast Kerry Hough, PA-C  100 mcg at 04/30/15 0612  . magnesium hydroxide (MILK OF MAGNESIA) suspension 30 mL  30 mL Oral Daily PRN Kerry Hough, PA-C      . multivitamin with minerals tablet 1 tablet  1 tablet Oral QODAY Craige Cotta, MD   1 tablet at 04/29/15 0949  . pantoprazole (PROTONIX) EC tablet 40 mg  40 mg Oral BID Kerry Hough, PA-C   40 mg at 04/30/15 0800  . pravastatin (PRAVACHOL) tablet 40 mg  40 mg Oral Daily Kerry Hough, PA-C   40 mg at 04/30/15 0800  . promethazine (PHENERGAN) tablet 25 mg  25 mg Oral Q8H PRN Kerry Hough, PA-C   25 mg at 04/29/15 1817  . thiamine (VITAMIN B-1) tablet 100 mg  100 mg Oral Daily Kerry Hough, PA-C   100 mg at 04/30/15 0800  . vitamin B-12 (CYANOCOBALAMIN) tablet 1,000 mcg  1,000 mcg Oral Daily Kerry Hough, PA-C   1,000 mcg at 04/30/15 0801  . ziprasidone (GEODON) capsule 20 mg  20 mg Oral BID WC Jomarie Longs, MD   20 mg at 04/30/15 0801  . zolpidem (AMBIEN) tablet 5 mg  5 mg Oral QHS Jomarie Longs, MD   5 mg at 04/29/15 2207    Lab Results:  Results for orders placed or performed during the hospital encounter of 04/27/15 (from the past 48 hour(s))  Lipid panel     Status: Abnormal   Collection Time: 04/30/15  6:30 AM  Result Value Ref Range   Cholesterol 199 0 - 200 mg/dL   Triglycerides 161 (H) <150 mg/dL   HDL 41 >09 mg/dL   Total CHOL/HDL Ratio 4.9 RATIO   VLDL 53 (H) 0 - 40 mg/dL   LDL Cholesterol 604 (H) 0 - 99 mg/dL    Comment:        Total Cholesterol/HDL:CHD Risk Coronary Heart Disease Risk Table                     Men    Women  1/2 Average Risk   3.4   3.3  Average Risk       5.0   4.4  2 X Average Risk   9.6   7.1  3 X Average Risk  23.4   11.0        Use the calculated Patient Ratio above and the CHD Risk Table to determine the patient's CHD Risk.        ATP III CLASSIFICATION (LDL):  <100     mg/dL   Optimal  540-981  mg/dL   Near or Above                    Optimal  130-159  mg/dL   Borderline  191-478  mg/dL   High  >295     mg/dL   Very High Performed at [facility]     Blood Alcohol level:  Lab Results  Component Value Date   [facility] <5 04/20/2015   ETH <5 03/23/2015    Physical Findings: AIMS: Facial and Oral Movements Muscles of Facial Expression: None, normal Lips and Perioral Area: None, normal Jaw: None, normal Tongue: None, normal,Extremity Movements Upper (arms, wrists, hands, fingers): None, normal Lower (legs, knees, ankles, toes): None, normal, Trunk Movements Neck, shoulders, hips: None, normal, Overall Severity Severity of abnormal movements (highest score from questions above): None, normal Incapacitation due to abnormal movements: None, normal Patient's awareness of abnormal movements (  rate only patient's report): No Awareness, Dental Status Current problems with teeth and/or dentures?: No Does patient usually wear dentures?: No  CIWA:    COWS:     Musculoskeletal: Strength & Muscle Tone: within normal limits Gait & Station: normal Patient leans: N/A  Psychiatric Specialty Exam: Review of Systems  HENT:       Right sided facial pain  Musculoskeletal: Positive for myalgias.  Neurological: Positive for headaches.  Psychiatric/Behavioral: The patient is nervous/anxious.   All other systems reviewed and are negative.   Blood pressure 116/79, pulse 81, temperature 97.7 F (36.5 C), temperature source Oral, resp. rate 20, height 5\' 4"  (1.626 m), weight 110.678 kg (244 lb).Body mass index is 41.86 kg/(m^2).  General Appearance: Fairly Groomed  Proofreaderye  Contact::  Fair  Speech:  Normal Rate  Volume:  Normal  Mood:  Anxious  Affect:  Labile  Thought Process:  Goal Directed  Orientation:  Full (Time, Place, and Person)  Thought Content:  Rumination  Suicidal Thoughts:  No  Homicidal Thoughts:  No  Memory:  Immediate;   Fair Recent;   Fair Remote;   Fair  Judgement:  Impaired  Insight:  Shallow  Psychomotor Activity:  Restlessness  Concentration:  Poor  Recall:  [employer]Fair  Fund of Knowledge:Fair  Language: Fair  Akathisia:  No  Handed:  Right  AIMS (if indicated):     Assets:  Communication Skills  ADL's:  Intact  Cognition: WNL  Sleep:  Number of Hours: 6.75   Treatment Plan Summary::Brittany [name] is a 50 years old obese Caucasian female, who has a past hx of Bipolar disorder as well as multiple medical problems including trigeminal neuralgia , who lives in [place] [facility] with complaints of suicide attempt by overdose on multiple medications . Pt per initial notes in EHR was treated for possible TCA OD . Pt today continues to be restless/anxious , will continue treatment. Daily contact with patient to assess and evaluate symptoms and progress in treatment and Medication management   Reviewed past medical records,treatment plan. I have reviewed H&p. Will continue Geodon 20 mg po bid for mood sx. Will get repeat EKG since patient had recent hx of qtc prolongation. Will continue Klonopin to 0.5 mg po qid prn for anxiety/muscle spasms. Will also continue Gabapentin 600 mg po tid for mood sx, pain. Will continue Ambien 5 mg po qhs for sleep. Will restart home medications as per agitation protocol. Will continue to monitor vitals ,medication compliance and treatment side effects while patient is here.  Will monitor for medical issues as well as call consult as needed.  Reviewed labs UDS - shows stimulants pos, BZD pos ,will need to explore more about stimulant abuse. lipid panel- abnormal , will recommend  diet control ,pending  hba1c, pl .  CSW will continue working on disposition.  Patient to participate in therapeutic milieu .       Quame Spratlin, MD 04/30/2015, 11:25 AM

## 2015-04-30 NOTE — Progress Notes (Signed)
D: Pt has anxious affect and mood.  She has rapid, pressured speech.  Reports her goal for the day is "trying to stay pain-free and get used to having an antipsychotic medication."  Pt reports she "made an appointment with a neurologist" regarding her jaw pain.  She reports the pain is on-going and is worse when she tries to eat or drink.  Pt denies SI/HI, denies hallucinations.  Pt is very focused on what medications she can get and when can get them.  Pt has been visible in milieu interacting with peers and staff appropriately.  Pt attended evening group.   A: Introduced self to pt.  Met with pt 1:1 and provided support and encouragement.  Actively listened to pt.  Education provided related to medications and medication regimen.  Medications administered per order.  PRN medication administered for pain and anxiety. R: Pt is compliant with medications.  Pt verbally contracts for safety.  Will continue to monitor and assess.

## 2015-05-01 DIAGNOSIS — F3163 Bipolar disorder, current episode mixed, severe, without psychotic features: Principal | ICD-10-CM

## 2015-05-01 MED ORDER — HYDROXYZINE HCL 50 MG PO TABS
50.0000 mg | ORAL_TABLET | Freq: Once | ORAL | Status: AC
  Administered 2015-05-01: 50 mg via ORAL
  Filled 2015-05-01 (×2): qty 1

## 2015-05-01 MED ORDER — QUETIAPINE FUMARATE 25 MG PO TABS
25.0000 mg | ORAL_TABLET | Freq: Three times a day (TID) | ORAL | Status: DC | PRN
Start: 2015-05-01 — End: 2015-05-03
  Administered 2015-05-01 – 2015-05-03 (×4): 25 mg via ORAL
  Filled 2015-05-01 (×5): qty 1

## 2015-05-01 MED ORDER — HYDROXYZINE HCL 50 MG PO TABS
50.0000 mg | ORAL_TABLET | Freq: Every day | ORAL | Status: DC
  Filled 2015-05-01: qty 1

## 2015-05-01 MED ORDER — QUETIAPINE FUMARATE 50 MG PO TABS
50.0000 mg | ORAL_TABLET | Freq: Every day | ORAL | Status: DC
  Administered 2015-05-01 – 2015-05-02 (×2): 50 mg via ORAL
  Filled 2015-05-01 (×5): qty 1

## 2015-05-01 NOTE — [facility] Group Notes (Signed)
[facility] Group Notes:  (Clinical Social Work)  05/01/2015  11:15-12:00PM  Summary of Progress/Problems:   Today's process group involved patients discussing their feelings related to being hospitalized, as well as how they can use their present feelings to create a plan for how to stay well and out of the hospital. The patient expressed her primary feeling about being hospitalized is "elated" because she would have died if her mood stabilizer had not been changed.  She interacted very well with others and made numerous suggestions for healthy coping techniques to help oneself stay out of the hospital including a routine/schedule and coloring and taking someone with her (her fiance) to doctor's appointments.  Type of Therapy:  Group Therapy - Process  Participation Level:  Active  Participation Quality:  Attentive, Sharing and Supportive  Affect:  Appropriate  Cognitive:  Appropriate  Insight:  Engaged  Engagement in Therapy:  Engaged  Modes of Intervention:  Exploration, Discussion  Ambrose MantleMareida Grossman-Orr, LCSW 05/01/2015, 12:21 PM

## 2015-05-01 NOTE — Progress Notes (Signed)
Patient's came to [employer] wanting writer to get $20.00 out of her locker to given to her female friend.  Patient signed to retreive $20.00.

## 2015-05-01 NOTE — Progress Notes (Signed)
D:  Patient's self inventory sheet, patient has fair sleep, sleep medication is helpful.  Good appetite, normal energy level, good concentration.  Rated depression and hopeless 3, anxiety 7.  Denied withdrawals.  Trigeminal Neuralgia, agitation, nausea.  Denied SI.  Physical problems today pain, headaches, rash, trigeminal neuralgia.  Pain 8-10 in past 24 hours, L cheek and lip, jaw line, some eye pain.  Pain medication is helpful.  Goal is to stay focused all day, prepare for discharge.  Plans to pay attention to everybody, maintained and study monarch sheet.  Does have discharge plans. A:  Medications administered per MD orders.  Emotional support and encouragement given patient. R:  Deneid SI and HI, contracts for safety.  Denied A/V hallucinations.  Safety maintained with 15 minute checks.

## 2015-05-01 NOTE — Progress Notes (Signed)
[facility] Group Notes:  (Nursing/MHT/Case Management/Adjunct)  Date:  05/01/2015  Time:  9:31 PM  Type of Therapy:  Psychoeducational Skills  Participation Level:  Active  Participation Quality:  Attentive  Affect:  Anxious  Cognitive:  Appropriate  Insight:  Appropriate  Engagement in Group:  Monopolizing  Modes of Intervention:  Education  Summary of Progress/Problems: The patient expressed in group this evening that she had a "positive" day overall. She stated that she had a good talk with the nursing student. As for the theme of the day, her support system will be comprised of her lawyer, [facility], and her fiance.   Hazle CocaGOODMAN, Sipriano Fendley S 05/01/2015, 9:31 PM

## 2015-05-01 NOTE — Plan of Care (Signed)
Problem: Consults Goal: Depression Patient Education See Patient Education Module for education specifics.  Outcome: Progressing Nurse discussed depression/coping skills with patient.        

## 2015-05-01 NOTE — Progress Notes (Signed)
[facility] MD Progress Note  05/01/2015 12:08 PM Brittany [name]  MRN:  [id number] Subjective:  Pt states " I am feeling better than before."  Patient still endorses anxiety and trigeminal neuralgia pain.  Objective: Brittany [name] is awake, alert and oriented X4, found standing at nursing stations.  Denies suicidal or homicidal ideation. Denies auditory or visual hallucination and does not appear to be responding to internal stimuli. Patient is hyper verbal and has concerns with her discharge. Patient reports a "loan closing on Tuesday" and is requesting to be discharged by then. Patient reports interacting well with staff and others. Patient reports she is medication compliant without mediation side effects. Patient reports attending group session and learning coping skills. States her depression 4/10 today and states that her anxiety ins 8/10 however that is daily for her. Reports good appetite and states she is resting well. Support, encouragement and reassurance was provided.   Principal Problem: Bipolar disorder, curr episode mixed, severe, w/o psychotic features (HCC) Diagnosis:   Patient Active Problem List   Diagnosis Date Noted  . Bipolar disorder, curr episode mixed, severe, w/o psychotic features (HCC) [F31.63] 04/29/2015  . Morbid obesity due to excess calories (HCC) [E66.01]   . Esophageal reflux [K21.9]   . Migraine without aura and without status migrainosus, not intractable [G43.009]   . Acute respiratory failure with hypoxia (HCC) [J96.01] 04/23/2015  . Overdose [T50.901A] 03/23/2015  . Postextubation stridor [T85.89XA, R06.1]   . History of hepatitis C virus infection [Z86.19] 02/26/2015  . Acute respiratory failure (HCC) [J96.00]   . Endotracheally intubated [Z78.9]   . Acute encephalopathy [G93.40] 02/24/2015  . Drug overdose [T50.901A] 02/24/2015  . 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 Right facial pain [R51] 01/05/2015  . Orbit fracture (HCC) [S02.80XA] 11/30/2013  .  Polypharmacy [Z79.899] 01/18/2012    Class: Chronic  . Borderline personality disorder [F60.3] 01/18/2012  . Pyelonephritis [N12] 09/16/2011  . Hypothyroidism [E03.9] 09/16/2011   Total Time spent with patient: 30 minutes  Past Psychiatric History: Pt with past hx of Bipolar disorder/ borderline personality do, has had multiple hospitalizations in the past for mood sx as well as for OD. Pt was admitted most recently three times in three months in 2017 for severe OD on medications including her TCA as well as baclofen.   Past Medical History:  Past Medical History  Diagnosis Date  . Hypertension   . Bipolar 1 disorder (HCC)   . Anxiety   . Bulging lumbar disc   . Ruptured intervertebral disc   . Head injury   . Short-term memory loss   . Depression   . Hypothyroidism   . Goiter   . Facial pain 11/2013    walking dog when she tripped and "faceplanted" landing on her face.   . Facial nerve injury   . Hyperlipidemia   . Heart murmur   . Chronic bronchitis (HCC)   . GERD (gastroesophageal reflux disease)   . Migraine     "weekly" (03/23/2015)  . Seizures (HCC)     "I've had a few; had a grand mal when I took Neurotin; still take Neurotin" (03/23/2015)  . Chronic lower back pain     Past Surgical History  Procedure Laterality Date  . Tonsillectomy    . Abdominal hysterectomy    . Cesarean section  2004  . Eye surgery Right 11/2013    emergent lateral canthotomy due to proptosis  /notes 11/30/2013   Family History:  Family History  Problem Relation Age  of Onset  . Migraines Mother   . Migraines Father   . Other Father   . Migraines Sister    Family Psychiatric  History: As per H&p. Social History: As per H&p History  Alcohol Use  . Yes    Comment: 03/23/2015 "might drink at Christmastime"     History  Drug Use  . Yes  . Special: Marijuana    Comment: 03/23/2015 ""haven't smoked marijuana for years"    Social History   Social History  . Marital Status: Married     Spouse Name: N/A  . Number of Children: 1  . Years of Education: 16   Occupational History  . Unemployed    Social History Main Topics  . Smoking status: Former Smoker    Types: Cigarettes  . Smokeless tobacco: Never Used     Comment: "smoked cigarettes when I was 50 years old"  . Alcohol Use: Yes     Comment: 03/23/2015 "might drink at Christmastime"  . Drug Use: Yes    Special: Marijuana     Comment: 03/23/2015 ""haven't smoked marijuana for years"  . Sexual Activity: Yes    Birth Control/ Protection: Condom   Other Topics Concern  . None   Social History Narrative   Lives at home with her fiancee.   Right-handed.   No caffeine use.   Additional Social History:                         Sleep: Fair  Appetite:  Fair  Current Medications: Current Facility-Administered Medications  Medication Dose Route Frequency Provider Last Rate Last Dose  . alum & mag hydroxide-simeth (MAALOX/MYLANTA) 200-200-20 MG/5ML suspension 30 mL  30 mL Oral Q4H PRN Kerry HoughSpencer E Simon, PA-C      . butalbital-acetaminophen-caffeine (FIORICET, ESGIC) 50-325-40 MG per tablet 1 tablet  1 tablet Oral QID PRN Jomarie LongsSaramma Eappen, MD   1 tablet at 05/01/15 0812  . cholecalciferol (VITAMIN D) tablet 1,000 Units  1,000 Units Oral QODAY Kerry HoughSpencer E Simon, PA-C   1,000 Units at 05/01/15 [id number]  . clonazePAM (KLONOPIN) tablet 0.5 mg  0.5 mg Oral QID PRN Jomarie LongsSaramma Eappen, MD   0.5 mg at 05/01/15 0813  . dextromethorphan-guaiFENesin (MUCINEX DM) 30-600 MG per 12 hr tablet 1 tablet  1 tablet Oral BID PRN Kerry HoughSpencer E Simon, PA-C      . DULoxetine (CYMBALTA) DR capsule 60 mg  60 mg Oral Daily Kerry HoughSpencer E Simon, PA-C   60 mg at 05/01/15 0803  . folic acid (FOLVITE) tablet 1 mg  1 mg Oral Daily Kerry HoughSpencer E Simon, PA-C   1 mg at 05/01/15 [id number]  . furosemide (LASIX) tablet 20 mg  20 mg Oral BID Kerry HoughSpencer E Simon, PA-C   20 mg at 05/01/15 0804  . gabapentin (NEURONTIN) capsule 600 mg  600 mg Oral TID Kerry HoughSpencer E Simon, PA-C   600 mg at  05/01/15 1205  . levothyroxine (SYNTHROID, LEVOTHROID) tablet 100 mcg  100 mcg Oral QAC breakfast Kerry HoughSpencer E Simon, PA-C   100 mcg at 05/01/15 0700  . magnesium hydroxide (MILK OF MAGNESIA) suspension 30 mL  30 mL Oral Daily PRN Kerry HoughSpencer E Simon, PA-C      . multivitamin with minerals tablet 1 tablet  1 tablet Oral QODAY Craige CottaFernando A Cobos, MD   1 tablet at 05/01/15 0921  . pantoprazole (PROTONIX) EC tablet 40 mg  40 mg Oral BID Kerry HoughSpencer E Simon, PA-C   40 mg at 04/30/15  1615  . pravastatin (PRAVACHOL) tablet 40 mg  40 mg Oral Daily Kerry Hough, PA-C   40 mg at 05/01/15 0806  . promethazine (PHENERGAN) tablet 25 mg  25 mg Oral Q8H PRN Kerry Hough, PA-C   25 mg at 04/30/15 1619  . thiamine (VITAMIN B-1) tablet 100 mg  100 mg Oral Daily Kerry Hough, PA-C   100 mg at 05/01/15 1610  . vitamin B-12 (CYANOCOBALAMIN) tablet 1,000 mcg  1,000 mcg Oral Daily Kerry Hough, PA-C   1,000 mcg at 05/01/15 9604  . ziprasidone (GEODON) capsule 20 mg  20 mg Oral BID WC Jomarie Longs, MD   20 mg at 05/01/15 5409  . zolpidem (AMBIEN) tablet 5 mg  5 mg Oral QHS Jomarie Longs, MD   5 mg at 04/30/15 2120    Lab Results:  Results for orders placed or performed during the hospital encounter of 04/27/15 (from the past 48 hour(s))  Lipid panel     Status: Abnormal   Collection Time: 04/30/15  6:30 AM  Result Value Ref Range   Cholesterol 199 0 - 200 mg/dL   Triglycerides 811 (H) <150 mg/dL   HDL 41 >91 mg/dL   Total CHOL/HDL Ratio 4.9 RATIO   VLDL 53 (H) 0 - 40 mg/dL   LDL Cholesterol 478 (H) 0 - 99 mg/dL    Comment:        Total Cholesterol/HDL:CHD Risk Coronary Heart Disease Risk Table                     Men   Women  1/2 Average Risk   3.4   3.3  Average Risk       5.0   4.4  2 X Average Risk   9.6   7.1  3 X Average Risk  23.4   11.0        Use the calculated Patient Ratio above and the CHD Risk Table to determine the patient's CHD Risk.        ATP III CLASSIFICATION (LDL):  <100     mg/dL    Optimal  295-621  mg/dL   Near or Above                    Optimal  130-159  mg/dL   Borderline  308-657  mg/dL   High  >846     mg/dL   Very High Performed at [facility]     Blood Alcohol level:  Lab Results  Component Value Date   [facility] <5 04/20/2015   ETH <5 03/23/2015    Physical Findings: AIMS: Facial and Oral Movements Muscles of Facial Expression: None, normal Lips and Perioral Area: None, normal Jaw: None, normal Tongue: None, normal,Extremity Movements Upper (arms, wrists, hands, fingers): None, normal Lower (legs, knees, ankles, toes): None, normal, Trunk Movements Neck, shoulders, hips: None, normal, Overall Severity Severity of abnormal movements (highest score from questions above): None, normal Incapacitation due to abnormal movements: None, normal Patient's awareness of abnormal movements (rate only patient's report): No Awareness, Dental Status Current problems with teeth and/or dentures?: No Does patient usually wear dentures?: No  CIWA:  CIWA-Ar Total: 1 COWS:  COWS Total Score: 2  Musculoskeletal: Strength & Muscle Tone: within normal limits Gait & Station: normal Patient leans: N/A  Psychiatric Specialty Exam: Review of Systems  HENT:       Right sided facial pain  Musculoskeletal: Positive for myalgias.  Psychiatric/Behavioral: Positive  for depression. Negative for suicidal ideas and hallucinations. The patient is nervous/anxious. The patient does not have insomnia.   All other systems reviewed and are negative.   Blood pressure 127/85, pulse 81, temperature 97.7 F (36.5 C), temperature source Oral, resp. rate 20, height  (1.626 m), weight 110.678 kg (244 lb).Body mass index is 41.86 kg/(m^2).  General Appearance: Casual and Fairly Groomed  Patent attorney::  Fair trigeminal neuralgia noted  Speech:  Normal Rate    Volume:  Normal  Mood:  Anxious 8/10  Affect:  Labile  Thought Process:  Goal Directed  Orientation:  Full (Time,  Place, and Person)  Thought Content:  Rumination  Suicidal Thoughts:  No  Homicidal Thoughts:  No  Memory:  Immediate;   Fair Recent;   Fair Remote;   Fair  Judgement:  Impaired  Insight:  Shallow  Psychomotor Activity:  Restlessness-improvement   Concentration:  Poor  Recall:  [employer] of Knowledge:Fair  Language: Fair  Akathisia:  No  Handed:  Right  AIMS (if indicated):     Assets:  Communication Skills  ADL's:  Intact  Cognition: WNL  Sleep:  Number of Hours: 4.25     I agree with current treatment plan on 05/01/2015, Patient seen face-to-face for psychiatric evaluation follow-up, chart reviewed and case discussed with the MD [place] and [employer]. Reviewed the information documented and agree with the treatment plan.  Treatment Plan Summary: Daily contact with patient to assess and evaluate symptoms and progress in treatment and Medication management   Reviewed past medical records,treatment plan. I have reviewed H&p. Will continue Geodon 20 mg po bid for mood sx. Will get repeat EKG since patient had recent hx of qtc prolongation. Reviewed OT/OTc 404/486   Discontinue Geodon, Vistaril  and Phenergan for prolonged   Start Seroquel 25 mg PO TID PRN  and Seroquel   PO QHS for mood stabilization           Will continue Klonopin to 0.5 mg po qid prn for anxiety/muscle spasms. Will also continue Gabapentin 600 mg po tid for mood sx, pain. Start se Continue Ambien 5 mg po qhs for sleep. Will restart home medications as per agitation protocol. Will continue to monitor vitals ,medication compliance and treatment side effects while patient is here.  Will monitor for medical issues as well as call consult as needed.  Reviewed labs UDS - shows stimulants pos, BZD pos ,will need to explore more about stimulant abuse. lipid panel- abnormal , will recommend diet control ,pending  hba1c, pl .  CSW will continue working on disposition.  Patient to participate in therapeutic  milieu .    Oneta Rack, NP 05/01/2015, 12:08 PM I agree with assessment and plan Reymundo Poll. Dub Mikes, M.D.

## 2015-05-01 NOTE — Progress Notes (Signed)
Patient ID: Brittany [name], female   DOB: [birth year], 50 y.o.   MRN: [id number] D: Client is visible on the unit, walking back and forth to the nurses station, seen on the phone and in dayroom. Client reports pain in her right jaw, "I have this trigeminal nerve pain." A: Writer provide emotional support, administered medication for pain (see MAR). Staff will monitor q215min for safety. R: client is safe on the unit, attended group.

## 2015-05-02 NOTE — [facility] Group Notes (Signed)
[facility] Group Notes:  (Clinical Social Work)  05/02/2015  11:00AM-12:00PM  Summary of Progress/Problems:  The main focus of today's process group was to listen to a variety of genres of music and to identify that different types of music provoke different responses.  The patient then was able to identify personally what was soothing for them, as well as energizing, as well as how patient can personally use this knowledge in sleep habits, with depression, and with other symptoms.  The patient expressed at the beginning of group the overall feeling of anxiety at a level of 10 out of 10.  She left after half an hour, and did not return until the last song.  She stated that her anxiety was at a 7.  Type of Therapy:  Music Therapy   Participation Level:  Active  Participation Quality:  Attentive  Affect:  Blunted  Cognitive:  Disorganized  Insight:  Improving  Engagement in Therapy:  Limited  Modes of Intervention:   Activity, Exploration  Ambrose MantleMareida Grossman-Orr, LCSW 05/02/2015
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
## 2015-05-02 NOTE — [facility] Group Notes (Signed)
[facility] Group Notes:  (Nursing/MHT/Case Management/Adjunct)  Date:  05/02/2015  Time:  12:19 PM  Type of Therapy:  Psychoeducational Skills  Participation Level:  Active  Participation Quality:  Appropriate  Affect:  Appropriate  Cognitive:  Appropriate  Insight:  Appropriate  Engagement in Group:  Engaged  Modes of Intervention:  Discussion  Summary of Progress/Problems: Pt did attend self inventory group, pt reported that she was negative SI/HI, no AH/VH noted. Pt rated her depression as a 0, and her helplessness/hopelessness as a 0.     Pt reported no issues or concerns.   Jacquelyne BalintForrest, Lennette Fader Shanta 05/02/2015, 12:19 PM

## 2015-05-02 NOTE — Plan of Care (Signed)
Problem: Alteration in mood; excessive anxiety as evidenced by: Goal: STG-Pt will report an absence of self-harm thoughts/actions (Patient will report an absence of self-harm thoughts or actions)  Outcome: Progressing Client is safe on the unit, AEB q3015min safety checks, no suicide attempt made.

## 2015-05-02 NOTE — Progress Notes (Signed)
Patient ID: Brittany [name], female   DOB: 11-04-1965, 50 y.o.   MRN: [id number]   EKG completed and placed on chart. Brittany BalintShalita [name] RN

## 2015-05-02 NOTE — Progress Notes (Signed)
Adult Psychoeducational Group Note  Date:  05/02/2015 Time:  9:39 PM  Group Topic/Focus:  Wrap-Up Group:   The focus of this group is to help patients review their daily goal of treatment and discuss progress on daily workbooks.  Participation Level:  Active  Participation Quality:  Sharing  Affect:  Labile  Cognitive:  Lacking  Insight: Limited  Engagement in Group:  Engaged  Modes of Intervention:  Support  Additional Comments:  Patient attended and participated in group tonight. She reports that today she was in a lot of pain, however she received some pain medication that gave her some relief. She advised that she has good providers her that takes good care of her. Today she went outside, she went for her meals and attended group.  Brittany [name], Brittany [name] [facility] 05/02/2015, 9:39 PM

## 2015-05-02 NOTE — Progress Notes (Signed)
Patient ID: Brittany [name], female   DOB: [birth year], 50 y.o.   MRN: [id number]   D: Pt has been very intrusive and anxious on the unit today. Pt has continued to complain of anxiety, pain to her face, agitation, and headaches. Pt has required medication every 4 hours, with very little relief. At 4pm patient reported that she no longer needed anything but Percocet 10. Pt reported that her depression was a 1, hopelessness was a 1, and her anxiety was a 10. Pt reported being negative SI/HI, no AH/VH noted. Pt reported that her goal for today was to work on discharge plan and stay focused. A: 15 min checks continued for patient safety. R: Pt safety maintained.

## 2015-05-03 DIAGNOSIS — R9431 Abnormal electrocardiogram [ECG] [EKG]: Secondary | ICD-10-CM | POA: Diagnosis present

## 2015-05-03 LAB — PROLACTIN: PROLACTIN: 69.9 ng/mL — AB (ref 4.8–23.3)

## 2015-05-03 LAB — HEMOGLOBIN A1C
Hgb A1c MFr Bld: 5.1 % (ref 4.8–5.6)
MEAN PLASMA GLUCOSE: 100 mg/dL

## 2015-05-03 MED ORDER — QUETIAPINE FUMARATE 25 MG PO TABS: 25.0000 mg | ORAL_TABLET | Freq: Three times a day (TID) | ORAL | Status: DC | PRN

## 2015-05-03 MED ORDER — DULOXETINE HCL 60 MG PO CPEP: 60.0000 mg | ORAL_CAPSULE | Freq: Every day | ORAL | Status: DC

## 2015-05-03 MED ORDER — ZOLPIDEM TARTRATE 5 MG PO TABS: 5.0000 mg | ORAL_TABLET | Freq: Every day | ORAL | Status: DC

## 2015-05-03 NOTE — Progress Notes (Signed)
D: Pt presents anxious in affect and mood. Pt was tearful at the onset of shift as she requested something for pain and anxiety. " I need it before I go to group". Pt received the appropriate prn medications for her reported symptoms. Pt was able to receive some relief (refer to doc flow sheets).  Pt denied any SI/HI/AVH. Pt observed interacting with her peers appropriately within the milieu.   A: Writer administered scheduled and prn medications to pt, per MD orders. Continued support and availability as needed was extended to this pt. Staff continues to monitor pt with q2715min checks.  R: No adverse drug reactions noted. Pt receptive to treatment. Pt remains safe at this time.
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
## 2015-05-03 NOTE — [facility] Suicide Risk Assessment (Signed)
[facility]   Principal Problem: Bipolar disorder, curr episode mixed, severe, w/o psychotic features [facility](HCC) Discharge Diagnoses:  Patient Active Problem List   Diagnosis Date Noted  . Bipolar disorder, curr episode mixed, severe, w/o psychotic features (HCC) [F31.63] 04/29/2015  . Morbid obesity due to excess calories (HCC) [E66.01]   . Esophageal reflux [K21.9]   . Migraine without aura and without status migrainosus, not intractable [G43.009]   . Acute respiratory failure with hypoxia (HCC) [J96.01] 04/23/2015  . Overdose [T50.901A] 03/23/2015  . Postextubation stridor [T85.89XA, R06.1]   . History of hepatitis C virus infection [Z86.19] 02/26/2015  . Acute respiratory failure (HCC) [J96.00]   . Endotracheally intubated [Z78.9]   . Acute encephalopathy [G93.40] 02/24/2015  . Drug overdose [T50.901A] 02/24/2015  . 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 Right facial pain [R51] 01/05/2015  . Orbit fracture (HCC) [S02.80XA] 11/30/2013  . Polypharmacy [Z79.899] 01/18/2012    Class: Chronic  . Borderline personality disorder [F60.3] 01/18/2012  . Pyelonephritis [N12] 09/16/2011  . Hypothyroidism [E03.9] 09/16/2011    Total Time spent with patient: 30 minutes  Musculoskeletal: Strength & Muscle Tone: within normal limits Gait & Station: normal Patient leans: N/A  Psychiatric Specialty Exam: Review of Systems  Psychiatric/Behavioral: Negative for depression and hallucinations.  All other systems reviewed and are negative.   Blood pressure 124/77, pulse 85, temperature 97.7 F (36.5 C), temperature source Oral, resp. rate 20, height 5\' 4"  (1.626 m), weight 110.678 kg (244 lb).Body mass index is 41.86 kg/(m^2).  General Appearance: Casual  Eye Contact::  Fair  Speech:  Clear and Coherent409  Volume:  Normal  Mood:  Euthymic  Affect:  Congruent  Thought Process:  Coherent  Orientation:  Full (Time, Place, and Person)  Thought Content:  WDL  Suicidal  Thoughts:  No  Homicidal Thoughts:  No  Memory:  Immediate;   Fair Recent;   Fair Remote;   Fair  Judgement:  Fair  Insight:  Fair  Psychomotor Activity:  Normal  Concentration:  Fair  Recall:  Fis[employer]Fair  Fund of Knowledge:Fair  Language: Fair  Akathisia:  No  Handed:  Right  AIMS (if indicated):   0  Assets:  Communication Skills  Sleep:  Number of Hours: 5.75  Cognition: WNL  ADL's:  Intact   Mental Status Per Nursing Assessment::   On Admission:     Demographic Factors:  Caucasian  Loss Factors: Legal issues  Historical Factors: Impulsivity  Risk Reduction Factors:   Positive social support  Continued Clinical Symptoms:  Previous Psychiatric Diagnoses and Treatments  Cognitive Features That Contribute To Risk:  Polarized thinking    Suicide Risk:  Minimal: No identifiable suicidal ideation.  Patients presenting with no risk factors but with morbid ruminations; may be classified as minimal risk based on the severity of the depressive symptoms  Follow-up Information    [facility].   Specialty:  Behavioral Health   Why:  Contact Reggie Brooke DareKing with Transitional Care Team to find out when he has made you an appointment with [facility].  He can help with transportation as well.  [phone]   Contact information:   [address] [place] [address] [phone]       Plan Of Care/Follow-up recommendations:  Activity:  no restrictions Diet:  regular Tests:  as needed Other:  follow up with aftercare  Kreig Parson, MD 05/03/2015, 10:01 AM
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
## 2015-05-03 NOTE — Progress Notes (Signed)
D: Pt A &O X3. Pt presents with anxious affect and mood. Denies SI, HI, AVH and pain when assessed. Pt d/c home as per MD's order. Pt signed belonging sheet in agreement with items received from locker 19.Pt verbalized understanding related to d/c instructions. A: Scheduled medications and PRN medications administered as per [facility]. D/C instructions reviewed with pt. All belongings in locker 19 returned to pt at time of D/C. Availability, support and encouragement provided to pt throughout this shift. Q 15 minutes checks maintained for safety till time of d/c without self harm gestures or outburst to note at this time.  R: Pt receptive to care. Safety maintained till time of d/c without incident.

## 2015-05-03 NOTE — Progress Notes (Signed)
  [facility] Discharge Plan :  Will you be returning to the same living situation after discharge:  Yes,  home At discharge, do you have transportation home?: Yes,  [place] Do you have the ability to pay for your medications: Yes,  MCD  Release of information consent forms completed and in the chart;  Patient's signature needed at discharge.  Patient to Follow up at: Follow-up Information    [facility].   Specialty:  Behavioral Health   Why:  Contact Reggie Brooke DareKing with Transitional Care Team to find out when he has made you an appointment with [facility].  He can help with transportation as well.  [phone]   Contact information:   [address] [place] [address] [phone]       Next level of care provider has access to [facility] Link:no  Safety Planning and Suicide Prevention discussed: Yes,  yes  Have you used any form of tobacco in the last 30 days? (Cigarettes, Smokeless Tobacco, Cigars, and/or Pipes): No  Has patient been referred to the Quitline?: N/A patient is not a smoker  Patient has been referred for addiction treatment: N/A  Daryel Geraldorth, Aviella Disbrow B 05/03/2015, 11:10 AM

## 2015-05-10 ENCOUNTER — Ambulatory Visit: Payer: Medicaid Other | Admitting: Neurology

## 2015-05-10 ENCOUNTER — Telehealth: Payer: Self-pay | Admitting: Neurology

## 2015-05-10 NOTE — Telephone Encounter (Signed)
Pt called in and would like to switch physicians. She is requesting a female physician but does not care who. She is requesting a suggestion and a call back to let her know. Please call [phone]

## 2015-05-10 NOTE — Telephone Encounter (Addendum)
Chart reviewed, I just saw patient once in November 22nd 2016 for complains of right facial paresthesia, MRI of the brain without contrast was normal in January 2017.  She was admitted to psychiatric unit from March 15th to 20th 2017 for overdose on multiple medications, I have reviewed her hospital record.   She canceled follow-up appointment with me twice in January 25 2015 and again in May 10 2015, now she requested the change to a female physician.  I do not think she would be a suitable patient for our clinic with her current unstable psychiatric issues.

## 2015-05-11 ENCOUNTER — Encounter: Payer: Self-pay | Admitting: Neurology

## 2015-05-11 NOTE — Telephone Encounter (Signed)
[facility]

## 2015-05-11 NOTE — Telephone Encounter (Signed)
Spoke to patient - per Dr. Terrace ArabiaYan, she will need to get a referral from her PCP - patient agreeable.

## 2015-05-25 ENCOUNTER — Telehealth: Payer: Self-pay | Admitting: Neurology
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
## 2015-05-25 NOTE — Telephone Encounter (Signed)
Called pt back. Informed her that since she has missed too many f/u appt she was dismissed from our practice. She did state that she received our letter and said we could provide 30 day supply. Advised that Dr Terrace ArabiaYan did not prescribe tregretol in the past for her and she will have to call PCP or go to urgent care. She verbalized understanding. Also encouraged her to ask PCP for referral to [facility]. She stated "I can also see if my psychiatrist can do that referral".

## 2015-05-26 ENCOUNTER — Emergency Department (HOSPITAL_COMMUNITY)
Admission: EM | Admit: 2015-05-26 | Discharge: 2015-05-26 | Disposition: A | Discharge status: Home / Self Care | Payer: Medicaid Other | Attending: Emergency Medicine | Admitting: Emergency Medicine

## 2015-05-26 ENCOUNTER — Encounter (HOSPITAL_COMMUNITY): Payer: Self-pay

## 2015-05-26 DIAGNOSIS — Z87828 Personal history of other (healed) physical injury and trauma: Secondary | ICD-10-CM | POA: Diagnosis not present

## 2015-05-26 DIAGNOSIS — F319 Bipolar disorder, unspecified: Secondary | ICD-10-CM | POA: Insufficient documentation

## 2015-05-26 DIAGNOSIS — E039 Hypothyroidism, unspecified: Secondary | ICD-10-CM | POA: Diagnosis not present

## 2015-05-26 DIAGNOSIS — Z87891 Personal history of nicotine dependence: Secondary | ICD-10-CM | POA: Diagnosis not present

## 2015-05-26 DIAGNOSIS — Z8719 Personal history of other diseases of the digestive system: Secondary | ICD-10-CM | POA: Diagnosis not present

## 2015-05-26 DIAGNOSIS — G8929 Other chronic pain: Secondary | ICD-10-CM | POA: Insufficient documentation

## 2015-05-26 DIAGNOSIS — Z79899 Other long term (current) drug therapy: Secondary | ICD-10-CM | POA: Insufficient documentation

## 2015-05-26 DIAGNOSIS — R51 Headache: Secondary | ICD-10-CM | POA: Diagnosis present

## 2015-05-26 DIAGNOSIS — F419 Anxiety disorder, unspecified: Secondary | ICD-10-CM | POA: Diagnosis not present

## 2015-05-26 DIAGNOSIS — R011 Cardiac murmur, unspecified: Secondary | ICD-10-CM | POA: Diagnosis not present

## 2015-05-26 DIAGNOSIS — R519 Headache, unspecified: Secondary | ICD-10-CM

## 2015-05-26 DIAGNOSIS — I10 Essential (primary) hypertension: Secondary | ICD-10-CM | POA: Diagnosis not present

## 2015-05-26 DIAGNOSIS — Z9889 Other specified postprocedural states: Secondary | ICD-10-CM | POA: Insufficient documentation

## 2015-05-26 DIAGNOSIS — Z90710 Acquired absence of both cervix and uterus: Secondary | ICD-10-CM | POA: Diagnosis not present

## 2015-05-26 DIAGNOSIS — R109 Unspecified abdominal pain: Secondary | ICD-10-CM | POA: Diagnosis not present

## 2015-05-26 LAB — CBC WITH DIFFERENTIAL/PLATELET
BASOS PCT: 0 %
Basophils Absolute: 0 10*3/uL (ref 0.0–0.1)
EOS ABS: 0.1 10*3/uL (ref 0.0–0.7)
Eosinophils Relative: 1 %
HCT: 37.6 % (ref 36.0–46.0)
HEMOGLOBIN: 12.5 g/dL (ref 12.0–15.0)
Lymphocytes Relative: 22 %
Lymphs Abs: 2 10*3/uL (ref 0.7–4.0)
MCH: 30.1 pg (ref 26.0–34.0)
MCHC: 33.2 g/dL (ref 30.0–36.0)
MCV: 90.6 fL (ref 78.0–100.0)
MONOS PCT: 5 %
Monocytes Absolute: 0.5 10*3/uL (ref 0.1–1.0)
NEUTROS PCT: 72 %
Neutro Abs: 6.6 10*3/uL (ref 1.7–7.7)
Platelets: 285 10*3/uL (ref 150–400)
RBC: 4.15 MIL/uL (ref 3.87–5.11)
RDW: 13.7 % (ref 11.5–15.5)
WBC: 9.1 10*3/uL (ref 4.0–10.5)

## 2015-05-26 LAB — URINALYSIS, ROUTINE W REFLEX MICROSCOPIC
BILIRUBIN URINE: NEGATIVE
Glucose, UA: NEGATIVE mg/dL
Hgb urine dipstick: NEGATIVE
Ketones, ur: NEGATIVE mg/dL
LEUKOCYTES UA: NEGATIVE
NITRITE: NEGATIVE
PH: 8 (ref 5.0–8.0)
Protein, ur: NEGATIVE mg/dL
SPECIFIC GRAVITY, URINE: 1.013 (ref 1.005–1.030)

## 2015-05-26 LAB — COMPREHENSIVE METABOLIC PANEL
ALBUMIN: 4.4 g/dL (ref 3.5–5.0)
ALK PHOS: 78 U/L (ref 38–126)
ALT: 27 U/L (ref 14–54)
ANION GAP: 11 (ref 5–15)
AST: 34 U/L (ref 15–41)
BUN: 8 mg/dL (ref 6–20)
CALCIUM: 9.7 mg/dL (ref 8.9–10.3)
CHLORIDE: 105 mmol/L (ref 101–111)
CO2: 24 mmol/L (ref 22–32)
Creatinine, Ser: 0.69 mg/dL (ref 0.44–1.00)
GFR calc Af Amer: >60 mL/min (ref >60)
GFR calc non Af Amer: >60 mL/min (ref >60)
GLUCOSE: 95 mg/dL (ref 65–99)
Potassium: 4.6 mmol/L (ref 3.5–5.1)
SODIUM: 140 mmol/L (ref 135–145)
Total Bilirubin: 1 mg/dL (ref 0.3–1.2)
Total Protein: 8 g/dL (ref 6.5–8.1)

## 2015-05-26 LAB — LIPASE, BLOOD: Lipase: 21 U/L (ref 11–51)

## 2015-05-26 MED ORDER — OXYCODONE-ACETAMINOPHEN 5-325 MG PO TABS
1.0000 | ORAL_TABLET | Freq: Once | ORAL | Status: AC
  Administered 2015-05-26: 1 via ORAL
  Filled 2015-05-26: qty 1

## 2015-05-26 MED ORDER — SULFAMETHOXAZOLE-TRIMETHOPRIM 800-160 MG PO TABS: 1.0000 | ORAL_TABLET | Freq: Two times a day (BID) | ORAL | Status: AC

## 2015-05-26 MED ORDER — BACITRACIN ZINC 500 UNIT/GM EX OINT
TOPICAL_OINTMENT | Freq: Two times a day (BID) | CUTANEOUS | Status: DC
  Administered 2015-05-26: 1 via TOPICAL
  Filled 2015-05-26: qty 0.9

## 2015-05-26 MED ORDER — SULFAMETHOXAZOLE-TRIMETHOPRIM 800-160 MG PO TABS
1.0000 | ORAL_TABLET | Freq: Once | ORAL | Status: AC
  Administered 2015-05-26: 1 via ORAL
  Filled 2015-05-26: qty 1

## 2015-05-26 NOTE — ED Provider Notes (Signed)
CSN: [id number]     Arrival date & time 05/26/15  1120 History   First MD Initiated Contact with Patient 05/26/15 1221     Chief Complaint  Patient presents with  . Headache  . Aphasia  . Head Injury  . Bloated      HPI Patient presents to the emergency department with complaints of ongoing right sided facial pain for which she has been followed for approximately one year.  She has been seen by neurology.  she is also seen pain management without improvement in her symptoms.  She is frustrated by the recurrence of pain.  She also injured her head on April 5 this year.  She fell backwards and has a laceration to her scalp.  She did not seek medical care now she states she's having increasing pain from this area.  She also reports intermittent abdominal discomfort and states she was recently seen and evaluated and told she had gallstones.  She has not followed up with general surgery or GI or her doctors in regards to this.  She is without abdominal discomfort at this time.  She does report some dysuria and urinary frequency.  No fevers or chills.  No back pain.  No chest pain or shortness breath.   Past Medical History  Diagnosis Date  . Hypertension   . Bipolar 1 disorder (HCC)   . Anxiety   . Bulging lumbar disc   . Ruptured intervertebral disc   . Head injury   . Short-term memory loss   . Depression   . Hypothyroidism   . Goiter   . Facial pain 11/2013    walking dog when she tripped and "faceplanted" landing on her face.   . Facial nerve injury   . Hyperlipidemia   . Heart murmur   . Chronic bronchitis (HCC)   . GERD (gastroesophageal reflux disease)   . Migraine     "weekly" (03/23/2015)  . Seizures (HCC)     "I've had a few; had a grand mal when I took Neurotin; still take Neurotin" (03/23/2015)  . Chronic lower back pain    Past Surgical History  Procedure Laterality Date  . Tonsillectomy    . Abdominal hysterectomy    . Cesarean section  2004  . Eye surgery Right  11/2013    emergent lateral canthotomy due to proptosis  /notes 11/30/2013   Family History  Problem Relation Age of Onset  . Migraines Mother   . Migraines Father   . Other Father   . Migraines Sister    Social History  Substance Use Topics  . Smoking status: Former Smoker    Types: Cigarettes  . Smokeless tobacco: Never Used     Comment: "smoked cigarettes when I was 50 years old"  . Alcohol Use: Yes     Comment: 03/23/2015 "might drink at Christmastime"   OB History    No data available     Review of Systems  All other systems reviewed and are negative.     Allergies  Toradol; Suboxone; and Trazodone and nefazodone  Home Medications   Prior to Admission medications   Medication Sig Start Date End Date Taking? Authorizing Provider  b complex vitamins tablet Take 1 tablet by mouth daily.   Yes Historical Provider, MD  butalbital-acetaminophen-caffeine (FIORICET, ESGIC) 50-325-40 MG tablet Take 1 tablet by mouth every 4 (four) hours as needed for headache or migraine. 04/27/15  Yes Vassie Loll, MD  Cholecalciferol (VITAMIN D PO) Take 1 tablet  by mouth daily. Reported on 05/26/2015   Yes Historical Provider, MD  clonazePAM (KLONOPIN) 1 MG tablet Takes 1 tablet by mouth three times a day 04/14/15  Yes Historical Provider, MD  DULoxetine (CYMBALTA) 60 MG capsule Take 1 capsule (60 mg total) by mouth daily. 05/03/15  Yes Oneta Rackanika N Lewis, NP  folic acid (FOLVITE) 1 MG tablet Take 1 tablet (1 mg total) by mouth daily. 04/27/15  Yes Vassie Lollarlos Madera, MD  gabapentin (NEURONTIN) 400 MG capsule Take 800 mg by mouth 3 (three) times daily.   Yes Historical Provider, MD  hydrochlorothiazide (HYDRODIURIL) 25 MG tablet Take 25 mg by mouth daily.   Yes Historical Provider, MD  levothyroxine (SYNTHROID, LEVOTHROID) 100 MCG tablet Take 100 mcg by mouth daily before breakfast.   Yes Historical Provider, MD  lisinopril (PRINIVIL,ZESTRIL) 10 MG tablet Take 10 mg by mouth daily.   Yes Historical  Provider, MD  Multiple Vitamin (MULTIVITAMIN WITH MINERALS) TABS tablet Take 1 tablet by mouth daily.    Yes Historical Provider, MD  PROAIR HFA 108 [phone](90 Base) MCG/ACT inhaler Take 2 puffs by mouth every 4 (four) hours as needed. 05/21/15  Yes Historical Provider, MD  QVAR 80 MCG/ACT inhaler Take 2 puffs by mouth 2 (two) times daily. 05/21/15  Yes Historical Provider, MD  zolpidem (AMBIEN) 5 MG tablet Take 1 tablet (5 mg total) by mouth at bedtime. 05/03/15  Yes Oneta Rackanika N Lewis, NP  furosemide (LASIX) 20 MG tablet Take 1 tablet (20 mg total) by mouth 2 (two) times daily. 04/27/15 05/04/15  Vassie Lollarlos Madera, MD  gabapentin (NEURONTIN) 300 MG capsule Take 2 capsules (600 mg total) by mouth 3 (three) times daily. Patient not taking: Reported on 04/20/2015 03/27/15   Zannie CovePreetha Joseph, MD  pantoprazole (PROTONIX) 40 MG tablet Take 1 tablet (40 mg total) by mouth 2 (two) times daily. Patient not taking: Reported on 05/26/2015 03/10/15   Leroy SeaPrashant K Singh, MD  QUEtiapine (SEROQUEL) 25 MG tablet Take 1 tablet (25 mg total) by mouth 3 (three) times daily as needed (for anxiety). Take 2 tablets (50mg  total) by mouth at bedtime for mood stabilization/insomina Patient not taking: Reported on 05/26/2015 05/03/15   Oneta Rackanika N Lewis, NP         thiamine 100 MG tablet Take 1 tablet (100 mg total) by mouth daily. Patient not taking: Reported on 05/26/2015 04/27/15   Vassie Lollarlos Madera, MD  vitamin B-12 1000 MCG tablet Take 1 tablet (1,000 mcg total) by mouth daily. Patient not taking: Reported on 05/26/2015 04/27/15   Vassie Lollarlos Madera, MD   BP 129/84 mmHg  Pulse 73  Temp(Src) 98.4 F (36.9 C) (Oral)  Resp 18  Ht 5\' 4"  (1.626 m)  Wt 220 lb (99.791 kg)  BMI 37.74 kg/m2  SpO2 94% Physical Exam  Constitutional: She is oriented to person, place, and time. She appears well-developed and well-nourished. No distress.  HENT:  Head: Normocephalic.  Small laceration of the superior scalp without significant surrounding erythema but some mild serous  drainage.  Tenderness to palpation of the right face without right-sided facial swelling.  No obvious facial droop.  Eyes: EOM are normal.  Neck: Normal range of motion.  Cardiovascular: Normal rate, regular rhythm and normal heart sounds.   Pulmonary/Chest: Effort normal and breath sounds normal.  Abdominal: Soft. She exhibits no distension. There is no tenderness.  Musculoskeletal: Normal range of motion.  Neurological: She is alert and oriented to person, place, and time.  Skin: Skin is warm and dry.  Psychiatric:  She has a normal mood and affect. Judgment normal.  Nursing note and vitals reviewed.   ED Course  Procedures (including critical care time) Labs Review Labs Reviewed  CBC WITH DIFFERENTIAL/PLATELET  COMPREHENSIVE METABOLIC PANEL  LIPASE, BLOOD  URINALYSIS, ROUTINE W REFLEX MICROSCOPIC (NOT AT [facility])    Imaging Review No results found. I have personally reviewed and evaluated these images and lab results as part of my medical decision-making.   EKG Interpretation None      MDM   Final diagnoses:  Facial pain  Abdominal pain, unspecified abdominal location    Is a difficult situation given her long-standing history of recurrent pain.  She is asked for follow-up with [place] [facility] for which I've given her contact information.  In regards to ongoing pain management and I will leave this up to the primary care team and do not think that any changes in her medications need to be made to the emergency department.  I will place her on antibiotics for her scalp lesion.  This does not need to be repaired at this time but given the possibility of developing infection she'll be started on antibiotics and instructed to apply Neosporin twice a day.  Her LFTs are normal.  Of asked her to follow-up with the primary care physician regarding her gallstones and if necessary she may need referral to general surgery.  Her urine is without signs of infection.  Discharge home in good  condition.  Medical screening examination completed.  No life-threatening emergency.    Azalia Bilis, MD 05/26/15 1524

## 2015-05-26 NOTE — ED Notes (Signed)
Patient states she has trigeminal neuralgia and had a head injury on 05/19/15. Patient states she went to an UC and was given Neurontin with no relief. Patient states she is unable to eat or brush her teeth. Patient also c/o abdominal bloating and states she was seen on 05/21/15 and states she has gallbladder problems.

## 2015-05-26 NOTE — ED Notes (Signed)
[facility] Patient ambulated back to room without any issues

## 2015-06-10 ENCOUNTER — Encounter (HOSPITAL_COMMUNITY): Payer: Self-pay | Admitting: Emergency Medicine

## 2015-06-10 ENCOUNTER — Inpatient Hospital Stay (HOSPITAL_COMMUNITY)
Admission: EM | Admit: 2015-06-10 | Discharge: 2015-06-14 | DRG: 918 | Disposition: A | Discharge status: Other Acute Inpatient Hospital | Payer: Medicaid Other | Attending: Internal Medicine | Admitting: Internal Medicine

## 2015-06-10 ENCOUNTER — Emergency Department (HOSPITAL_COMMUNITY): Payer: Medicaid Other

## 2015-06-10 DIAGNOSIS — T428X2A Poisoning by antiparkinsonism drugs and other central muscle-tone depressants, intentional self-harm, initial encounter: Principal | ICD-10-CM | POA: Diagnosis present

## 2015-06-10 DIAGNOSIS — T50901A Poisoning by unspecified drugs, medicaments and biological substances, accidental (unintentional), initial encounter: Secondary | ICD-10-CM | POA: Diagnosis present

## 2015-06-10 DIAGNOSIS — E785 Hyperlipidemia, unspecified: Secondary | ICD-10-CM | POA: Diagnosis present

## 2015-06-10 DIAGNOSIS — F319 Bipolar disorder, unspecified: Secondary | ICD-10-CM

## 2015-06-10 DIAGNOSIS — Z885 Allergy status to narcotic agent status: Secondary | ICD-10-CM

## 2015-06-10 DIAGNOSIS — T50902A Poisoning by unspecified drugs, medicaments and biological substances, intentional self-harm, initial encounter: Secondary | ICD-10-CM

## 2015-06-10 DIAGNOSIS — Z7951 Long term (current) use of inhaled steroids: Secondary | ICD-10-CM

## 2015-06-10 DIAGNOSIS — R001 Bradycardia, unspecified: Secondary | ICD-10-CM | POA: Diagnosis present

## 2015-06-10 DIAGNOSIS — G839 Paralytic syndrome, unspecified: Secondary | ICD-10-CM | POA: Diagnosis present

## 2015-06-10 DIAGNOSIS — E870 Hyperosmolality and hypernatremia: Secondary | ICD-10-CM | POA: Diagnosis present

## 2015-06-10 DIAGNOSIS — Z886 Allergy status to analgesic agent status: Secondary | ICD-10-CM

## 2015-06-10 DIAGNOSIS — G500 Trigeminal neuralgia: Secondary | ICD-10-CM | POA: Diagnosis present

## 2015-06-10 DIAGNOSIS — T50904A Poisoning by unspecified drugs, medicaments and biological substances, undetermined, initial encounter: Secondary | ICD-10-CM

## 2015-06-10 DIAGNOSIS — F419 Anxiety disorder, unspecified: Secondary | ICD-10-CM | POA: Diagnosis present

## 2015-06-10 DIAGNOSIS — E039 Hypothyroidism, unspecified: Secondary | ICD-10-CM | POA: Diagnosis present

## 2015-06-10 DIAGNOSIS — K219 Gastro-esophageal reflux disease without esophagitis: Secondary | ICD-10-CM | POA: Diagnosis present

## 2015-06-10 DIAGNOSIS — G8929 Other chronic pain: Secondary | ICD-10-CM | POA: Diagnosis present

## 2015-06-10 DIAGNOSIS — Z79899 Other long term (current) drug therapy: Secondary | ICD-10-CM

## 2015-06-10 DIAGNOSIS — F3163 Bipolar disorder, current episode mixed, severe, without psychotic features: Secondary | ICD-10-CM | POA: Diagnosis present

## 2015-06-10 DIAGNOSIS — Z888 Allergy status to other drugs, medicaments and biological substances status: Secondary | ICD-10-CM

## 2015-06-10 DIAGNOSIS — M545 Low back pain: Secondary | ICD-10-CM | POA: Diagnosis present

## 2015-06-10 DIAGNOSIS — I10 Essential (primary) hypertension: Secondary | ICD-10-CM | POA: Insufficient documentation

## 2015-06-10 LAB — RAPID URINE DRUG SCREEN, HOSP PERFORMED
Amphetamines: NOT DETECTED
BARBITURATES: NOT DETECTED
BENZODIAZEPINES: POSITIVE — AB
Cocaine: NOT DETECTED
OPIATES: NOT DETECTED
Tetrahydrocannabinol: NOT DETECTED

## 2015-06-10 LAB — I-STAT CHEM 8, ED
BUN: 10 mg/dL (ref 6–20)
CALCIUM ION: 1.16 mmol/L (ref 1.12–1.23)
Chloride: 110 mmol/L (ref 101–111)
Creatinine, Ser: 0.5 mg/dL (ref 0.44–1.00)
Glucose, Bld: 111 mg/dL — ABNORMAL HIGH (ref 65–99)
HCT: 41 % (ref 36.0–46.0)
Hemoglobin: 13.9 g/dL (ref 12.0–15.0)
Potassium: 3.9 mmol/L (ref 3.5–5.1)
SODIUM: 151 mmol/L — AB (ref 135–145)
TCO2: 30 mmol/L (ref 0–100)

## 2015-06-10 LAB — CBC WITH DIFFERENTIAL/PLATELET
BASOS ABS: 0 10*3/uL (ref 0.0–0.1)
BASOS PCT: 0 %
EOS PCT: 1 %
Eosinophils Absolute: 0.1 10*3/uL (ref 0.0–0.7)
HCT: 39.8 % (ref 36.0–46.0)
Hemoglobin: 13 g/dL (ref 12.0–15.0)
Lymphocytes Relative: 20 %
Lymphs Abs: 1.6 10*3/uL (ref 0.7–4.0)
MCH: 29.7 pg (ref 26.0–34.0)
MCHC: 32.7 g/dL (ref 30.0–36.0)
MCV: 90.9 fL (ref 78.0–100.0)
MONO ABS: 0.6 10*3/uL (ref 0.1–1.0)
MONOS PCT: 8 %
NEUTROS ABS: 5.6 10*3/uL (ref 1.7–7.7)
Neutrophils Relative %: 71 %
PLATELETS: 343 10*3/uL (ref 150–400)
RBC: 4.38 MIL/uL (ref 3.87–5.11)
RDW: 13.9 % (ref 11.5–15.5)
WBC: 7.9 10*3/uL (ref 4.0–10.5)

## 2015-06-10 LAB — COMPREHENSIVE METABOLIC PANEL
ALK PHOS: 72 U/L (ref 38–126)
ALT: 38 U/L (ref 14–54)
ANION GAP: 11 (ref 5–15)
AST: 32 U/L (ref 15–41)
Albumin: 4 g/dL (ref 3.5–5.0)
BILIRUBIN TOTAL: 0.2 mg/dL — AB (ref 0.3–1.2)
BUN: 8 mg/dL (ref 6–20)
CALCIUM: 9.2 mg/dL (ref 8.9–10.3)
CO2: 25 mmol/L (ref 22–32)
CREATININE: 0.61 mg/dL (ref 0.44–1.00)
Chloride: 112 mmol/L — ABNORMAL HIGH (ref 101–111)
GFR calc Af Amer: >60 mL/min (ref >60)
GFR calc non Af Amer: >60 mL/min (ref >60)
GLUCOSE: 108 mg/dL — AB (ref 65–99)
Potassium: 3.7 mmol/L (ref 3.5–5.1)
SODIUM: 148 mmol/L — AB (ref 135–145)
TOTAL PROTEIN: 7.7 g/dL (ref 6.5–8.1)

## 2015-06-10 LAB — URINALYSIS, ROUTINE W REFLEX MICROSCOPIC
BILIRUBIN URINE: NEGATIVE
Glucose, UA: NEGATIVE mg/dL
Hgb urine dipstick: NEGATIVE
Ketones, ur: NEGATIVE mg/dL
LEUKOCYTES UA: NEGATIVE
NITRITE: NEGATIVE
PH: 7 (ref 5.0–8.0)
Protein, ur: NEGATIVE mg/dL
SPECIFIC GRAVITY, URINE: 1.021 (ref 1.005–1.030)

## 2015-06-10 LAB — ETHANOL: Alcohol, Ethyl (B): <5 mg/dL (ref <5)

## 2015-06-10 LAB — ACETAMINOPHEN LEVEL

## 2015-06-10 LAB — SALICYLATE LEVEL: Salicylate Lvl: <4 mg/dL (ref 2.8–30.0)

## 2015-06-10 LAB — CK: CK TOTAL: 250 U/L — AB (ref 38–234)

## 2015-06-10 LAB — CBG MONITORING, ED: GLUCOSE-CAPILLARY: 111 mg/dL — AB (ref 65–99)

## 2015-06-10 MED ORDER — ONDANSETRON HCL 4 MG/2ML IJ SOLN: 4.0000 mg | Freq: Four times a day (QID) | INTRAMUSCULAR | Status: DC | PRN

## 2015-06-10 MED ORDER — ACETAMINOPHEN 325 MG PO TABS
650.0000 mg | ORAL_TABLET | Freq: Four times a day (QID) | ORAL | Status: DC | PRN
  Administered 2015-06-10 – 2015-06-12 (×4): 650 mg via ORAL
  Filled 2015-06-10 (×5): qty 2

## 2015-06-10 MED ORDER — LEVOTHYROXINE SODIUM 100 MCG IV SOLR: 50.0000 ug | Freq: Every day | INTRAVENOUS | Status: DC

## 2015-06-10 MED ORDER — SODIUM CHLORIDE 0.9 % IV SOLN: INTRAVENOUS | Status: DC

## 2015-06-10 MED ORDER — ENOXAPARIN SODIUM 40 MG/0.4ML ~~LOC~~ SOLN
40.0000 mg | SUBCUTANEOUS | Status: DC
  Administered 2015-06-10 – 2015-06-14 (×5): 40 mg via SUBCUTANEOUS
  Filled 2015-06-10 (×6): qty 0.4

## 2015-06-10 MED ORDER — LORAZEPAM 2 MG/ML IJ SOLN
1.0000 mg | Freq: Once | INTRAMUSCULAR | Status: AC
  Administered 2015-06-10: 1 mg via INTRAVENOUS
  Filled 2015-06-10: qty 1

## 2015-06-10 MED ORDER — ONDANSETRON HCL 4 MG PO TABS: 4.0000 mg | ORAL_TABLET | Freq: Four times a day (QID) | ORAL | Status: DC | PRN

## 2015-06-10 MED ORDER — HYDRALAZINE HCL 20 MG/ML IJ SOLN: 10.0000 mg | INTRAMUSCULAR | Status: DC | PRN

## 2015-06-10 MED ORDER — SODIUM CHLORIDE 0.9% FLUSH
3.0000 mL | Freq: Two times a day (BID) | INTRAVENOUS | Status: DC
  Administered 2015-06-10 – 2015-06-11 (×3): 3 mL via INTRAVENOUS

## 2015-06-10 MED ORDER — DEXTROSE-NACL 5-0.45 % IV SOLN
INTRAVENOUS | Status: DC
  Administered 2015-06-10 (×2): via INTRAVENOUS

## 2015-06-10 MED ORDER — ALBUTEROL SULFATE (2.5 MG/3ML) 0.083% IN NEBU: 2.5000 mg | INHALATION_SOLUTION | RESPIRATORY_TRACT | Status: DC | PRN

## 2015-06-10 MED ORDER — SODIUM CHLORIDE 0.9 % IV BOLUS (SEPSIS)
1000.0000 mL | Freq: Once | INTRAVENOUS | Status: AC
Start: 2015-06-10 — End: 2015-06-10
  Administered 2015-06-10: 1000 mL via INTRAVENOUS

## 2015-06-10 MED ORDER — LORAZEPAM 2 MG/ML IJ SOLN
1.0000 mg | Freq: Four times a day (QID) | INTRAMUSCULAR | Status: DC | PRN
  Administered 2015-06-10: 1 mg via INTRAVENOUS
  Filled 2015-06-10: qty 1

## 2015-06-10 NOTE — ED Notes (Signed)
[facility] [employer].  They are encouraged by pt improvements.  Requested EKG.

## 2015-06-10 NOTE — ED Provider Notes (Signed)
By signing my name below, I, Freida Busman, attest that this documentation has been prepared under the direction and in the presence of Azarria Balint N Bekim Werntz, DO . Electronically Signed: Freida Busman, Scribe. 06/10/2015. 2:07 AM.  TIME SEEN: 1:51 AM   CHIEF COMPLAINT:  Chief Complaint  Patient presents with  . Drug Overdose   LEVEL 5 CAVEAT DUE TO ACUITY OF MEDICAL CONDITION   HPI:  HPI Comments:  Brittany [name] is a 50 y.o. female with history of hypertension, bipolar disorder, hyperlipidemia, multiple episodes of accidental overdose brought in by ambulance, who presents to the Emergency Department for drug overdose. Per EMS pt has ingested ~5500mg  of baclofen in the last 36 hours. Pt picked up new rx on 06/08/15 for 90 pills and their only approximately 33 pills left. Husband reports that he took 2 of her pills today. EMS states pt's husband called EMS when he found the pt laid out on the ground and "foaming at the mouth". EMS notes no other meds were found in the house. Patient has been somnolent but will open eyes and move extremities. She does not talk or follow commands. Blood glucose in the 150s with EMS.   ROS: LEVEL 5 CAVEAT DUE TO ACUITY OF MEDICAL CONDITION    PAST MEDICAL HISTORY/PAST SURGICAL HISTORY:  Past Medical History  Diagnosis Date  . Hypertension   . Bipolar 1 disorder (HCC)   . Anxiety   . Bulging lumbar disc   . Ruptured intervertebral disc   . Head injury   . Short-term memory loss   . Depression   . Hypothyroidism   . Goiter   . Facial pain 11/2013    walking dog when she tripped and "faceplanted" landing on her face.   . Facial nerve injury   . Hyperlipidemia   . Heart murmur   . Chronic bronchitis (HCC)   . GERD (gastroesophageal reflux disease)   . Migraine     "weekly" (03/23/2015)  . Seizures (HCC)     "I've had a few; had a grand mal when I took Neurotin; still take Neurotin" (03/23/2015)  . Chronic lower back pain     MEDICATIONS:  Prior to  Admission medications   Medication Sig Start Date End Date Taking? Authorizing Provider  b complex vitamins tablet Take 1 tablet by mouth daily.    Historical Provider, MD  butalbital-acetaminophen-caffeine (FIORICET, ESGIC) 50-325-40 MG tablet Take 1 tablet by mouth every 4 (four) hours as needed for headache or migraine. 04/27/15   Vassie Loll, MD  Cholecalciferol (VITAMIN D PO) Take 1 tablet by mouth daily. Reported on 05/26/2015    Historical Provider, MD  clonazePAM Scarlette Calico) 1 MG tablet Takes 1 tablet by mouth three times a day 04/14/15   Historical Provider, MD  DULoxetine (CYMBALTA) 60 MG capsule Take 1 capsule (60 mg total) by mouth daily. 05/03/15   Oneta Rack, NP  folic acid (FOLVITE) 1 MG tablet Take 1 tablet (1 mg total) by mouth daily. 04/27/15   Vassie Loll, MD  furosemide (LASIX) 20 MG tablet Take 1 tablet (20 mg total) by mouth 2 (two) times daily. 04/27/15 05/04/15  Vassie Loll, MD  gabapentin (NEURONTIN) 300 MG capsule Take 2 capsules (600 mg total) by mouth 3 (three) times daily. Patient not taking: Reported on 04/20/2015 03/27/15   Zannie Cove, MD  gabapentin (NEURONTIN) 400 MG capsule Take 800 mg by mouth 3 (three) times daily.    Historical Provider, MD  hydrochlorothiazide (HYDRODIURIL) 25 MG tablet Take  25 mg by mouth daily.    Historical Provider, MD  levothyroxine (SYNTHROID, LEVOTHROID) 100 MCG tablet Take 100 mcg by mouth daily before breakfast.    Historical Provider, MD  lisinopril (PRINIVIL,ZESTRIL) 10 MG tablet Take 10 mg by mouth daily.    Historical Provider, MD  Multiple Vitamin (MULTIVITAMIN WITH MINERALS) TABS tablet Take 1 tablet by mouth daily.     Historical Provider, MD  pantoprazole (PROTONIX) 40 MG tablet Take 1 tablet (40 mg total) by mouth 2 (two) times daily. Patient not taking: Reported on 05/26/2015 03/10/15   Leroy Sea, MD  PROAIR HFA 108 [phone] Base) MCG/ACT inhaler Take 2 puffs by mouth every 4 (four) hours as needed. 05/21/15   Historical  Provider, MD  QUEtiapine (SEROQUEL) 25 MG tablet Take 1 tablet (25 mg total) by mouth 3 (three) times daily as needed (for anxiety). Take 2 tablets (  total) by mouth at bedtime for mood stabilization/insomina Patient not taking: Reported on 05/26/2015 05/03/15   Oneta Rack, NP  QVAR 80 MCG/ACT inhaler Take 2 puffs by mouth 2 (two) times daily. 05/21/15   Historical Provider, MD  thiamine 100 MG tablet Take 1 tablet (100 mg total) by mouth daily. Patient not taking: Reported on 05/26/2015 04/27/15   Vassie Loll, MD  vitamin B-12 1000 MCG tablet Take 1 tablet (1,000 mcg total) by mouth daily. Patient not taking: Reported on 05/26/2015 04/27/15   Vassie Loll, MD  zolpidem (AMBIEN) 5 MG tablet Take 1 tablet (5 mg total) by mouth at bedtime. 05/03/15   Oneta Rack, NP    ALLERGIES:  Allergies  Allergen Reactions  . Toradol [Ketorolac Tromethamine] Swelling  . Suboxone [Buprenorphine Hcl-Naloxone Hcl] Other (See Comments)    cellulitis   . Trazodone And Nefazodone Other (See Comments)    Bladder infection    SOCIAL HISTORY:  Social History  Substance Use Topics  . Smoking status: Former Smoker    Types: Cigarettes  . Smokeless tobacco: Never Used     Comment: "smoked cigarettes when I was 50 years old"  . Alcohol Use: Yes     Comment: 03/23/2015 "might drink at Christmastime"    FAMILY HISTORY: Family History  Problem Relation Age of Onset  . Migraines Mother   . Migraines Father   . Other Father   . Migraines Sister     EXAM: BP 175/112 mmHg  Pulse 52  Temp(Src) 97.4 F (36.3 C) (Axillary)  Resp 15  SpO2 100% CONSTITUTIONAL: Alert; opens eyes to voice. Chronically ill appearing; well-nourished; GCS 10 HEAD: Normocephalic; atraumatic EYES: Conjunctivae clear, PERRL, EOMI ENT: normal nose; no rhinorrhea; moist mucous membranes; pharynx without lesions noted; no dental injury; no septal hematoma NECK: Supple, no meningismus, no LAD; no midline spinal tenderness,  step-off or deformity CARD: RRR; S1 and S2 appreciated; no murmurs, no clicks, no rubs, no gallops RESP: Normal chest excursion without splinting or tachypnea; breath sounds clear and equal bilaterally; no wheezes, no rhonchi, no rales; no hypoxia or respiratory distress CHEST:  chest wall stable, no crepitus or ecchymosis or deformity, nontender to palpation ABD/GI: Normal bowel sounds; non-distended; soft, non-tender, no rebound, no guarding PELVIS:  stable, nontender to palpation BACK:  The back appears normal and is non-tender to palpation, there is no CVA tenderness; no midline spinal tenderness, step-off or deformity EXT: Moves all extremities spontaneously; non-tender to palpation; no edema; normal capillary refill; no cyanosis, no bony tenderness or bony deformity of patient's extremities, no joint effusion, no ecchymosis  or lacerations    SKIN: Normal color for age and race; warm NEURO: Moves all extremities spontaneously; opens eyes spontaneously and to voice, does not follow commands or answer questions   MEDICAL DECISION MAKING: Patient here with overdose on baclofen.  Has been admitted to the hospital several times in the past for overdose. Discussed with poison control who states that this can cause bradycardia, coma and even signs of brain death. They feel she will likely need observation admission. Recommend labs, EKG, urine. Also given patient is found down, or GCS of 10, will obtain CT of her head and cervical spine. No other sign of trauma on exam.  ED PROGRESS: EG shows bradycardia but no other abnormality.  Labs show mild hypernatremia and hyperchloremia. CK elevated at 250 with normal creatinine. She is receiving IV hydration. Urine drug screen is positive for benzodiazepines. Ethanol level is negative. CT of her head and cervical spine are unremarkable. Chest x-ray shows mild vascular congestion and bibasilar atelectasis. Patient is still very drowsy but arousable to voice and  physical stimuli. GCS is still 10. I feel she will need admission. She is mildly hypertensive in the emergency per. Still mildly bradycardic. Sats are 94% on room air we have her on a nonrebreather.   4:40 AM  Discussed patient's case with hospitalist, Dr. Katrinka BlazingSmith.  Recommend admission to telemetry, observation bed.  I will place holding orders per their request. Patient and family (if present) updated with plan. Care transferred to hospitalist service.  I reviewed all nursing notes, vitals, pertinent old records, EKGs, labs, imaging (as available).     EKG Interpretation  Date/Time:  Thursday June 10 2015 02:23:37 EDT Ventricular Rate:  50 PR Interval:  166 QRS Duration: 97 QT Interval:  522 QTC Calculation: 476 R Axis:   -21 Text Interpretation:  Sinus rhythm Borderline left axis deviation Low voltage, precordial leads No significant change since last tracing Confirmed by Kenijah Benningfield,  DO, Michelle Wnek ([id number] on 06/10/2015 4:12:46 AM        I personally performed the services described in this documentation, which was scribed in my presence. The recorded information has been reviewed and is accurate.    Layla MawKristen N Calum Cormier, DO 06/10/15 [phone]

## 2015-06-10 NOTE — ED Notes (Signed)
MD  Paged re fluids.

## 2015-06-10 NOTE — ED Notes (Signed)
Dr Jerral RalphGhimire paged and responded RE pt increased agitation.  Pt c/o pain as well, but MD does not feel pain meds are appropriate at this time.

## 2015-06-10 NOTE — ED Notes (Signed)
Attempted report 

## 2015-06-10 NOTE — Progress Notes (Signed)
Spoke with debrah from poison control and updated her regarding pt, and most resent vitals. poison control will ctm

## 2015-06-10 NOTE — ED Notes (Signed)
Patient taken 5500mg  of Baclofen in the last 36 hours.  Patient has been combative, but she has been very lethargic.  CBG of 156.  Husband called EMS when she was unresponsive on floor.  Patient had prescription filled on 06/08/15, only 33 pills left in 90 pill bottle.  Patient only responsive to pain.

## 2015-06-10 NOTE — Progress Notes (Addendum)
PROGRESS NOTE        PATIENT DETAILS Name: Brittany [name] Age: 50 y.o. Sex: female Date of Birth: 08-Jun-1965 Admit Date: 06/10/2015 Admitting Physician Clydie Braunondell A Smith, MD LKG:[id number]:NIEMEYER, Chyrl CivatteMEINDERT, MD Outpatient Specialists:None  Brief Narrative: Patient is a 50 y.o. female with PMHx of numerous drug overdoses this year alone presented to the ED on 4/27 with drug overdose with baclofen.  Subjective: Still somnolent-but awakes-no longer requiring 100%NRB  Assessment/Plan: Principal Problem: Acute Encephalopathy:secondary to drug overdose with Baclofen. Improving slowly with supportive care. Follow and monitor closely  Active Problems: Drug overdose:not sure if this is accidental or intentional-patient currently encephalopathic-however has had multiple drug overdoses with suicidal intent this year alone -hence will order a sitter at bedside, and will require psychiatric consultation when more awake and alert.   Hypernatremia:gently hydrate and recheck in am  Sinus Bradycardia:appears mild and now essentially resolved.  Hx of Depression/Bipolar disorder:continue to hold Klonopin, Seroquel, and Cymbalta   Hx of chronic pain/neuropathy: still somnolent-resume home meds slowly  Hypothyroidism:start IV Levothyroxine-change to oral route when more awake and alert.  HTN: BP currently relatively controlled-continue to hold HCTZ, Lisinopril-resume when able.  DVT Prophylaxis: Prophylactic Lovenox   Code Status: Full code  Family Communication: None at bedside  Disposition Plan: Remain inpatient  Antimicrobial agents: None  Procedures: None  CONSULTS:  None  Time spent: 30 minutes-Greater than 50% of this time was spent in counseling, explanation of diagnosis, planning of further management, and coordination of care.  MEDICATIONS: Anti-infectives    None      Scheduled Meds: . enoxaparin (LOVENOX) injection  40 mg Subcutaneous Q24H  .  sodium chloride flush  3 mL Intravenous Q12H   Continuous Infusions: . sodium chloride    . dextrose 5 % and 0.45% NaCl 100 mL/hr at 06/10/15 0622   PRN Meds:.albuterol, hydrALAZINE, ondansetron **OR** ondansetron (ZOFRAN) IV   PHYSICAL EXAM: Vital signs: Filed Vitals:   06/10/15 0845 06/10/15 0930 06/10/15 1015 06/10/15 1100  BP: 135/84 141/86 153/88 145/88  Pulse: 66 71 77 84  Temp:      TempSrc:      Resp: 16 17 12 14   SpO2: 97% 95% 91% 93%   There were no vitals filed for this visit. There is no weight on file to calculate BMI.   Gen Exam: Still somnolent this am-but seems to be slowly improving compared to initial presentation.  Neck: Supple Chest: B/L Clear.   CVS: S1 S2 Regular, no murmurs.  Abdomen: soft, BS +, non tender, non distended.  Extremities: no edema, lower extremities warm to touch. Neurologic: Difficult exam-but moves all 4 ext Skin: No Rash or lesions   Wounds: N/A.    LABORATORY DATA: CBC:  Recent Labs Lab 06/10/15 0224 06/10/15 0300  WBC  --  7.9  NEUTROABS  --  5.6  HGB 13.9 13.0  HCT 41.0 39.8  MCV  --  90.9  PLT  --  343    Basic Metabolic Panel:  Recent Labs Lab 06/10/15 0224 06/10/15 0300  NA 151* 148*  K 3.9 3.7  CL 110 112*  CO2  --  25  GLUCOSE 111* 108*  BUN 10 8  CREATININE 0.50 0.61  CALCIUM  --  9.2    GFR: CrCl cannot be calculated (Unknown ideal weight.).  Liver Function Tests:  Recent Labs Lab 06/10/15 0300  AST 32  ALT 38  ALKPHOS 72  BILITOT 0.2*  PROT 7.7  ALBUMIN 4.0   No results for input(s): LIPASE, AMYLASE in the last 168 hours. No results for input(s): AMMONIA in the last 168 hours.  Coagulation Profile: No results for input(s): INR, PROTIME in the last 168 hours.  Cardiac Enzymes:  Recent Labs Lab 06/10/15 0300  CKTOTAL 250*    BNP (last 3 results) No results for input(s): PROBNP in the last 8760 hours.  HbA1C: No results for input(s): HGBA1C in the last 72  hours.  CBG:  Recent Labs Lab 06/10/15 0202  GLUCAP 111*    Lipid Profile: No results for input(s): CHOL, HDL, LDLCALC, TRIG, CHOLHDL, LDLDIRECT in the last 72 hours.  Thyroid Function Tests: No results for input(s): TSH, T4TOTAL, FREET4, T3FREE, THYROIDAB in the last 72 hours.  Anemia Panel: No results for input(s): VITAMINB12, FOLATE, FERRITIN, TIBC, IRON, RETICCTPCT in the last 72 hours.  Urine analysis:    Component Value Date/Time   COLORURINE YELLOW 06/10/2015 0253   COLORURINE Yellow 04/25/2011 1804   APPEARANCEUR CLOUDY* 06/10/2015 0253   APPEARANCEUR Clear 04/25/2011 1804   LABSPEC 1.021 06/10/2015 0253   LABSPEC 1.024 04/25/2011 1804   PHURINE 7.0 06/10/2015 0253   PHURINE 6.0 04/25/2011 1804   GLUCOSEU NEGATIVE 06/10/2015 0253   GLUCOSEU Negative 04/25/2011 1804   HGBUR NEGATIVE 06/10/2015 0253   HGBUR Negative 04/25/2011 1804   BILIRUBINUR NEGATIVE 06/10/2015 0253   BILIRUBINUR Negative 04/25/2011 1804   KETONESUR NEGATIVE 06/10/2015 0253   KETONESUR Negative 04/25/2011 1804   PROTEINUR NEGATIVE 06/10/2015 0253   PROTEINUR Negative 04/25/2011 1804   UROBILINOGEN 0.2 12/23/2014 1048   NITRITE NEGATIVE 06/10/2015 0253   NITRITE Negative 04/25/2011 1804   LEUKOCYTESUR NEGATIVE 06/10/2015 0253   LEUKOCYTESUR Negative 04/25/2011 1804    Sepsis Labs: Lactic Acid, Venous    Component Value Date/Time   LATICACIDVEN 1.6 04/20/2015 1827    MICROBIOLOGY: No results found for this or any previous visit (from the past 240 hour(s)).  RADIOLOGY STUDIES/RESULTS: Ct Head Wo Contrast  06/10/2015  CLINICAL DATA:  Found down.  Unresponsive. EXAM: CT HEAD WITHOUT CONTRAST CT CERVICAL SPINE WITHOUT CONTRAST TECHNIQUE: Multidetector CT imaging of the head and cervical spine was performed following the standard protocol without intravenous contrast. Multiplanar CT image reconstructions of the cervical spine were also generated. COMPARISON:  03/25/2015 head CT FINDINGS:  CT HEAD FINDINGS Skull and Sinuses:No acute fracture or destructive process. Remote right orbital floor blow-out fracture continuing along the anterior maxilla. Right nasal trumpet is located. Visualized orbits: Negative. Brain: Normal. No evidence of acute infarction, hemorrhage, hydrocephalus, or mass lesion/mass effect. CT CERVICAL SPINE FINDINGS Motion artifact at the level of C7 and the thoracic spine. Cervical spine imaging is overall diagnostic. No evidence of acute fracture or traumatic malalignment. No gross canal hematoma or prevertebral edema. Atelectasis seen in the left upper lobe. IMPRESSION: No evidence of acute intracranial or cervical spine injury. Electronically Signed   By: Marnee Spring M.D.   On: 06/10/2015 03:52   Ct Cervical Spine Wo Contrast  06/10/2015  CLINICAL DATA:  Found down.  Unresponsive. EXAM: CT HEAD WITHOUT CONTRAST CT CERVICAL SPINE WITHOUT CONTRAST TECHNIQUE: Multidetector CT imaging of the head and cervical spine was performed following the standard protocol without intravenous contrast. Multiplanar CT image reconstructions of the cervical spine were also generated. COMPARISON:  03/25/2015 head CT FINDINGS: CT HEAD FINDINGS Skull and Sinuses:No acute fracture or destructive process. Remote right orbital floor  blow-out fracture continuing along the anterior maxilla. Right nasal trumpet is located. Visualized orbits: Negative. Brain: Normal. No evidence of acute infarction, hemorrhage, hydrocephalus, or mass lesion/mass effect. CT CERVICAL SPINE FINDINGS Motion artifact at the level of C7 and the thoracic spine. Cervical spine imaging is overall diagnostic. No evidence of acute fracture or traumatic malalignment. No gross canal hematoma or prevertebral edema. Atelectasis seen in the left upper lobe. IMPRESSION: No evidence of acute intracranial or cervical spine injury. Electronically Signed   By: Marnee Spring M.D.   On: 06/10/2015 03:52   Dg Chest Portable 1  View  06/10/2015  CLINICAL DATA:  Patient found down. Unresponsive. Initial encounter. EXAM: PORTABLE CHEST 1 VIEW COMPARISON:  Chest radiograph from 04/22/2015 FINDINGS: The lungs are well-aerated. Mild vascular congestion is noted. Mild bibasilar atelectasis is seen. There is no evidence of pleural effusion or pneumothorax. The cardiomediastinal silhouette is borderline enlarged. No acute osseous abnormalities are seen. IMPRESSION: Mild vascular congestion and borderline cardiomegaly noted. Mild bibasilar atelectasis seen. Electronically Signed   By: Roanna Raider M.D.   On: 06/10/2015 02:37       Jeoffrey Massed, MD  [facility] Pager:[phone]  If 7PM-7AM, please contact night-coverage [email or web address] Password [email or web address] 06/10/2015, 1:41 PM

## 2015-06-10 NOTE — H&P (Addendum)
History and Physical    Brittany [name] UJW:[id number] DOB: [birth year] DOA: 06/10/2015  Referring MD/NP/PA: Dr. Elesa MassedWard PCP: [name], [name], MD  Outpatient Specialists: -- Patient coming from: Home  Chief Complaint: Overdose  HPI: Brittany [name] is a 50 y.o. female with medical history significant of bipolar disorder, overdose, HTN, hypothyroidism, and chronic lower back pain; who presented with drug overdose. Patient history is obtained from review of records as patient is obtunded and unable to provide her own history. Patient had picked up a new prescription on 05/29/2015 of baclofen and was found to have ingested 5500 mg within the last 36 hours. Patient was found by her husband on the ground foaming at the mouth and he called 911. EMS did not note any other medications found in the home. Patient's overall intent was not clear, but upon reviewing the patient's medical record it appears she has had multiple admissions to behavioral health with previous overdoses in the past. Most recent was in 04/2015.  ED Course: On admission into the emergency room patient was evaluated and seen have positive urine drug screen for benzodiazepines. Poison control was contacted and recommended close monitoring. CT of the brain and cervical spine show no acute abnormalities.Patient was placed on nonrebreather, but patient was never seen to be hypoxic. TRH consult to admit  Review of Systems: Unable to obtain secondary to patient's obtunded  Past Medical History  Diagnosis Date  . Hypertension   . Bipolar 1 disorder (HCC)   . Anxiety   . Bulging lumbar disc   . Ruptured intervertebral disc   . Head injury   . Short-term memory loss   . Depression   . Hypothyroidism   . Goiter   . Facial pain 11/2013    walking dog when she tripped and "faceplanted" landing on her face.   . Facial nerve injury   . Hyperlipidemia   . Heart murmur   . Chronic bronchitis (HCC)   . GERD (gastroesophageal reflux  disease)   . Migraine     "weekly" (03/23/2015)  . Seizures (HCC)     "I've had a few; had a grand mal when I took Neurotin; still take Neurotin" (03/23/2015)  . Chronic lower back pain     Past Surgical History  Procedure Laterality Date  . Tonsillectomy    . Abdominal hysterectomy    . Cesarean section  2004  . Eye surgery Right 11/2013    emergent lateral canthotomy due to proptosis  /notes 11/30/2013     reports that she has quit smoking. Her smoking use included Cigarettes. She has never used smokeless tobacco. She reports that she drinks alcohol. She reports that she does not use illicit drugs.  Allergies  Allergen Reactions  . Toradol [Ketorolac Tromethamine] Swelling  . Suboxone [Buprenorphine Hcl-Naloxone Hcl] Other (See Comments)    cellulitis   . Trazodone And Nefazodone Other (See Comments)    Bladder infection    Family History  Problem Relation Age of Onset  . Migraines Mother   . Migraines Father   . Other Father   . Migraines Sister     Prior to Admission medications   Medication Sig Start Date End Date Taking? Authorizing Provider  b complex vitamins tablet Take 1 tablet by mouth daily.    Historical Provider, MD  butalbital-acetaminophen-caffeine (FIORICET, ESGIC) 50-325-40 MG tablet Take 1 tablet by mouth every 4 (four) hours as needed for headache or migraine. 04/27/15   Vassie Lollarlos Madera, MD  Cholecalciferol (VITAMIN D PO) Take  1 tablet by mouth daily. Reported on 05/26/2015    Historical Provider, MD  clonazePAM Scarlette Calico) 1 MG tablet Takes 1 tablet by mouth three times a day 04/14/15   Historical Provider, MD  DULoxetine (CYMBALTA) 60 MG capsule Take 1 capsule (60 mg total) by mouth daily. 05/03/15   Oneta Rack, NP  folic acid (FOLVITE) 1 MG tablet Take 1 tablet (1 mg total) by mouth daily. 04/27/15   Vassie Loll, MD  furosemide (LASIX) 20 MG tablet Take 1 tablet (20 mg total) by mouth 2 (two) times daily. 04/27/15 05/04/15  Vassie Loll, MD  gabapentin  (NEURONTIN) 300 MG capsule Take 2 capsules (600 mg total) by mouth 3 (three) times daily. Patient not taking: Reported on 04/20/2015 03/27/15   Zannie Cove, MD  gabapentin (NEURONTIN) 400 MG capsule Take 800 mg by mouth 3 (three) times daily.    Historical Provider, MD  hydrochlorothiazide (HYDRODIURIL) 25 MG tablet Take 25 mg by mouth daily.    Historical Provider, MD  levothyroxine (SYNTHROID, LEVOTHROID) 100 MCG tablet Take 100 mcg by mouth daily before breakfast.    Historical Provider, MD  lisinopril (PRINIVIL,ZESTRIL) 10 MG tablet Take 10 mg by mouth daily.    Historical Provider, MD  Multiple Vitamin (MULTIVITAMIN WITH MINERALS) TABS tablet Take 1 tablet by mouth daily.     Historical Provider, MD  pantoprazole (PROTONIX) 40 MG tablet Take 1 tablet (40 mg total) by mouth 2 (two) times daily. Patient not taking: Reported on 05/26/2015 03/10/15   Leroy Sea, MD  PROAIR HFA 108 [phone] Base) MCG/ACT inhaler Take 2 puffs by mouth every 4 (four) hours as needed. 05/21/15   Historical Provider, MD  QUEtiapine (SEROQUEL) 25 MG tablet Take 1 tablet (25 mg total) by mouth 3 (three) times daily as needed (for anxiety). Take 2 tablets (50mg  total) by mouth at bedtime for mood stabilization/insomina Patient not taking: Reported on 05/26/2015 05/03/15   Oneta Rack, NP  QVAR 80 MCG/ACT inhaler Take 2 puffs by mouth 2 (two) times daily. 05/21/15   Historical Provider, MD  thiamine 100 MG tablet Take 1 tablet (100 mg total) by mouth daily. Patient not taking: Reported on 05/26/2015 04/27/15   Vassie Loll, MD  vitamin B-12 1000 MCG tablet Take 1 tablet (1,000 mcg total) by mouth daily. Patient not taking: Reported on 05/26/2015 04/27/15   Vassie Loll, MD  zolpidem (AMBIEN) 5 MG tablet Take 1 tablet (5 mg total) by mouth at bedtime. 05/03/15   Oneta Rack, NP    Physical Exam: Filed Vitals:   06/10/15 0300 06/10/15 0430 06/10/15 0445 06/10/15 0500  BP: 147/104 166/113 161/109 160/102  Pulse: 51 53 55 54   Temp:      TempSrc:      Resp: 15 17 18 18   SpO2: 100% 100% 100% 100%      Constitutional: Obese female who is obtunded who will withdraw to noxious stimuli Filed Vitals:   06/10/15 0300 06/10/15 0430 06/10/15 0445 06/10/15 0500  BP: 147/104 166/113 161/109 160/102  Pulse: 51 53 55 54  Temp:      TempSrc:      Resp: 15 17 18 18   SpO2: 100% 100% 100% 100%   Eyes: PERRL, lids and conjunctivae normal ENMT: Mucous membranes are moist. Posterior pharynx clear of any exudate or lesions.Normal dentition.  Neck: normal, supple, no masses, no thyromegaly Respiratory: Snoring, but otherwise sounds clear to auscultation bilaterally. No wheezes or rhonchi appreciated. Cardiovascular: Bradycardic Abdomen: no tenderness,  no masses palpated. No hepatosplenomegaly. Bowel sounds positive.  Musculoskeletal: no clubbing / cyanosis. No joint deformity upper and lower extremities. Good ROM, no contractures. Normal muscle tone.  Skin: no rashes, lesions, ulcers. No induration Neurologic: CN 2-12 grossly intact. Reflexes decreased 1+. Psychiatric: Patient obtunded   Labs on Admission: I have personally reviewed following labs and imaging studies  CBC:  Recent Labs Lab 06/10/15 0224 06/10/15 0300  WBC  --  7.9  NEUTROABS  --  5.6  HGB 13.9 13.0  HCT 41.0 39.8  MCV  --  90.9  PLT  --  343   Basic Metabolic Panel:  Recent Labs Lab 06/10/15 0224 06/10/15 0300  NA 151* 148*  K 3.9 3.7  CL 110 112*  CO2  --  25  GLUCOSE 111* 108*  BUN 10 8  CREATININE 0.50 0.61  CALCIUM  --  9.2   GFR: CrCl cannot be calculated (Unknown ideal weight.). Liver Function Tests:  Recent Labs Lab 06/10/15 0300  AST 32  ALT 38  ALKPHOS 72  BILITOT 0.2*  PROT 7.7  ALBUMIN 4.0   No results for input(s): LIPASE, AMYLASE in the last 168 hours. No results for input(s): AMMONIA in the last 168 hours. Coagulation Profile: No results for input(s): INR, PROTIME in the last 168 hours. Cardiac  Enzymes:  Recent Labs Lab 06/10/15 0300  CKTOTAL 250*   BNP (last 3 results) No results for input(s): PROBNP in the last 8760 hours. HbA1C: No results for input(s): HGBA1C in the last 72 hours. CBG:  Recent Labs Lab 06/10/15 0202  GLUCAP 111*   Lipid Profile: No results for input(s): CHOL, HDL, LDLCALC, TRIG, CHOLHDL, LDLDIRECT in the last 72 hours. Thyroid Function Tests: No results for input(s): TSH, T4TOTAL, FREET4, T3FREE, THYROIDAB in the last 72 hours. Anemia Panel: No results for input(s): VITAMINB12, FOLATE, FERRITIN, TIBC, IRON, RETICCTPCT in the last 72 hours. Urine analysis:    Component Value Date/Time   COLORURINE YELLOW 06/10/2015 0253   COLORURINE Yellow 04/25/2011 1804   APPEARANCEUR CLOUDY* 06/10/2015 0253   APPEARANCEUR Clear 04/25/2011 1804   LABSPEC 1.021 06/10/2015 0253   LABSPEC 1.024 04/25/2011 1804   PHURINE 7.0 06/10/2015 0253   PHURINE 6.0 04/25/2011 1804   GLUCOSEU NEGATIVE 06/10/2015 0253   GLUCOSEU Negative 04/25/2011 1804   HGBUR NEGATIVE 06/10/2015 0253   HGBUR Negative 04/25/2011 1804   BILIRUBINUR NEGATIVE 06/10/2015 0253   BILIRUBINUR Negative 04/25/2011 1804   KETONESUR NEGATIVE 06/10/2015 0253   KETONESUR Negative 04/25/2011 1804   PROTEINUR NEGATIVE 06/10/2015 0253   PROTEINUR Negative 04/25/2011 1804   UROBILINOGEN 0.2 12/23/2014 1048   NITRITE NEGATIVE 06/10/2015 0253   NITRITE Negative 04/25/2011 1804   LEUKOCYTESUR NEGATIVE 06/10/2015 0253   LEUKOCYTESUR Negative 04/25/2011 1804   Sepsis Labs:  )No results found for this or any previous visit (from the past 240 hour(s)).   Radiological Exams on Admission: Ct Head Wo Contrast  06/10/2015  CLINICAL DATA:  Found down.  Unresponsive. EXAM: CT HEAD WITHOUT CONTRAST CT CERVICAL SPINE WITHOUT CONTRAST TECHNIQUE: Multidetector CT imaging of the head and cervical spine was performed following the standard protocol without intravenous contrast. Multiplanar CT image  reconstructions of the cervical spine were also generated. COMPARISON:  03/25/2015 head CT FINDINGS: CT HEAD FINDINGS Skull and Sinuses:No acute fracture or destructive process. Remote right orbital floor blow-out fracture continuing along the anterior maxilla. Right nasal trumpet is located. Visualized orbits: Negative. Brain: Normal. No evidence of acute infarction, hemorrhage, hydrocephalus, or mass  lesion/mass effect. CT CERVICAL SPINE FINDINGS Motion artifact at the level of C7 and the thoracic spine. Cervical spine imaging is overall diagnostic. No evidence of acute fracture or traumatic malalignment. No gross canal hematoma or prevertebral edema. Atelectasis seen in the left upper lobe. IMPRESSION: No evidence of acute intracranial or cervical spine injury. Electronically Signed   By: Marnee Spring M.D.   On: 06/10/2015 03:52   Ct Cervical Spine Wo Contrast  06/10/2015  CLINICAL DATA:  Found down.  Unresponsive. EXAM: CT HEAD WITHOUT CONTRAST CT CERVICAL SPINE WITHOUT CONTRAST TECHNIQUE: Multidetector CT imaging of the head and cervical spine was performed following the standard protocol without intravenous contrast. Multiplanar CT image reconstructions of the cervical spine were also generated. COMPARISON:  03/25/2015 head CT FINDINGS: CT HEAD FINDINGS Skull and Sinuses:No acute fracture or destructive process. Remote right orbital floor blow-out fracture continuing along the anterior maxilla. Right nasal trumpet is located. Visualized orbits: Negative. Brain: Normal. No evidence of acute infarction, hemorrhage, hydrocephalus, or mass lesion/mass effect. CT CERVICAL SPINE FINDINGS Motion artifact at the level of C7 and the thoracic spine. Cervical spine imaging is overall diagnostic. No evidence of acute fracture or traumatic malalignment. No gross canal hematoma or prevertebral edema. Atelectasis seen in the left upper lobe. IMPRESSION: No evidence of acute intracranial or cervical spine injury.  Electronically Signed   By: Marnee Spring M.D.   On: 06/10/2015 03:52   Dg Chest Portable 1 View  06/10/2015  CLINICAL DATA:  Patient found down. Unresponsive. Initial encounter. EXAM: PORTABLE CHEST 1 VIEW COMPARISON:  Chest radiograph from 04/22/2015 FINDINGS: The lungs are well-aerated. Mild vascular congestion is noted. Mild bibasilar atelectasis is seen. There is no evidence of pleural effusion or pneumothorax. The cardiomediastinal silhouette is borderline enlarged. No acute osseous abnormalities are seen. IMPRESSION: Mild vascular congestion and borderline cardiomegaly noted. Mild bibasilar atelectasis seen. Electronically Signed   By: Roanna Raider M.D.   On: 06/10/2015 02:37    EKG: Independently reviewed. Sinus rhythm with borderline liver  Assessment/Plan Overdose on baclofen: Recurrent. Noted she likely took 5500 mg baclofen over the last 36 hours. UDS positive for benzodiazepines as well Suspect that this is intentional given patient's previous history of repeated overdoses in the past. - Admit to a telemetry bed - Continuous pulse oximetry with nasal cannula oxygen keep O2 sats greater than 92% - NPO - Zofran prn Nausea/vomiting - Would consult TTS once patient more alert  Sinus bradycardia: Thought likely to be secondary to baclofen. Heart rates appear to be staying above 50 currently - Continue to monitor   Mildly elevated CK level: CK 250 on admission. Patient was 2 L of NS while in the ED. -IV fluids of D5 1/2NS at 100 ml/hr  Hypernatremia: Na 151 on admission - Recheck BMP in a.m.  Bipolar disorder - Continue to monitor  DVT prophylaxis: Lovenox  Code Status: Full  Family Communication: None Disposition Plan: Undetermined Consults called: None Admission status: Telemetry obs  Clydie Braun MD [facility] Pager [phone]  If 7PM-7AM, please contact night-coverage [email or web address] Password [facility]  06/10/2015, 5:36 AM

## 2015-06-11 ENCOUNTER — Encounter (HOSPITAL_COMMUNITY): Payer: Self-pay

## 2015-06-11 DIAGNOSIS — T50901A Poisoning by unspecified drugs, medicaments and biological substances, accidental (unintentional), initial encounter: Secondary | ICD-10-CM | POA: Diagnosis not present

## 2015-06-11 DIAGNOSIS — T50901D Poisoning by unspecified drugs, medicaments and biological substances, accidental (unintentional), subsequent encounter: Secondary | ICD-10-CM | POA: Diagnosis not present

## 2015-06-11 DIAGNOSIS — K219 Gastro-esophageal reflux disease without esophagitis: Secondary | ICD-10-CM | POA: Diagnosis present

## 2015-06-11 DIAGNOSIS — M545 Low back pain: Secondary | ICD-10-CM | POA: Diagnosis present

## 2015-06-11 DIAGNOSIS — Z79899 Other long term (current) drug therapy: Secondary | ICD-10-CM | POA: Diagnosis not present

## 2015-06-11 DIAGNOSIS — G839 Paralytic syndrome, unspecified: Secondary | ICD-10-CM | POA: Diagnosis present

## 2015-06-11 DIAGNOSIS — T1491 Suicide attempt: Secondary | ICD-10-CM

## 2015-06-11 DIAGNOSIS — E039 Hypothyroidism, unspecified: Secondary | ICD-10-CM | POA: Diagnosis present

## 2015-06-11 DIAGNOSIS — Z885 Allergy status to narcotic agent status: Secondary | ICD-10-CM | POA: Diagnosis not present

## 2015-06-11 DIAGNOSIS — E870 Hyperosmolality and hypernatremia: Secondary | ICD-10-CM | POA: Diagnosis present

## 2015-06-11 DIAGNOSIS — F3163 Bipolar disorder, current episode mixed, severe, without psychotic features: Secondary | ICD-10-CM | POA: Diagnosis present

## 2015-06-11 DIAGNOSIS — T428X2A Poisoning by antiparkinsonism drugs and other central muscle-tone depressants, intentional self-harm, initial encounter: Principal | ICD-10-CM

## 2015-06-11 DIAGNOSIS — Z7951 Long term (current) use of inhaled steroids: Secondary | ICD-10-CM | POA: Diagnosis not present

## 2015-06-11 DIAGNOSIS — T50902A Poisoning by unspecified drugs, medicaments and biological substances, intentional self-harm, initial encounter: Secondary | ICD-10-CM | POA: Diagnosis not present

## 2015-06-11 DIAGNOSIS — R001 Bradycardia, unspecified: Secondary | ICD-10-CM | POA: Diagnosis present

## 2015-06-11 DIAGNOSIS — F419 Anxiety disorder, unspecified: Secondary | ICD-10-CM | POA: Diagnosis present

## 2015-06-11 DIAGNOSIS — F3160 Bipolar disorder, current episode mixed, unspecified: Secondary | ICD-10-CM | POA: Diagnosis not present

## 2015-06-11 DIAGNOSIS — E785 Hyperlipidemia, unspecified: Secondary | ICD-10-CM | POA: Diagnosis present

## 2015-06-11 DIAGNOSIS — T50902D Poisoning by unspecified drugs, medicaments and biological substances, intentional self-harm, subsequent encounter: Secondary | ICD-10-CM | POA: Diagnosis not present

## 2015-06-11 DIAGNOSIS — Z888 Allergy status to other drugs, medicaments and biological substances status: Secondary | ICD-10-CM | POA: Diagnosis not present

## 2015-06-11 DIAGNOSIS — G8929 Other chronic pain: Secondary | ICD-10-CM | POA: Diagnosis present

## 2015-06-11 DIAGNOSIS — G500 Trigeminal neuralgia: Secondary | ICD-10-CM | POA: Diagnosis present

## 2015-06-11 DIAGNOSIS — Z886 Allergy status to analgesic agent status: Secondary | ICD-10-CM | POA: Diagnosis not present

## 2015-06-11 DIAGNOSIS — I10 Essential (primary) hypertension: Secondary | ICD-10-CM | POA: Diagnosis present

## 2015-06-11 LAB — CBC
HCT: 38.8 % (ref 36.0–46.0)
Hemoglobin: 12.7 g/dL (ref 12.0–15.0)
MCH: 29.5 pg (ref 26.0–34.0)
MCHC: 32.7 g/dL (ref 30.0–36.0)
MCV: 90 fL (ref 78.0–100.0)
PLATELETS: 343 10*3/uL (ref 150–400)
RBC: 4.31 MIL/uL (ref 3.87–5.11)
RDW: 13.9 % (ref 11.5–15.5)
WBC: 7.3 10*3/uL (ref 4.0–10.5)

## 2015-06-11 LAB — BASIC METABOLIC PANEL
Anion gap: 11 (ref 5–15)
BUN: 5 mg/dL — AB (ref 6–20)
CALCIUM: 9.4 mg/dL (ref 8.9–10.3)
CHLORIDE: 106 mmol/L (ref 101–111)
CO2: 24 mmol/L (ref 22–32)
CREATININE: 0.66 mg/dL (ref 0.44–1.00)
GFR calc Af Amer: >60 mL/min (ref >60)
GFR calc non Af Amer: >60 mL/min (ref >60)
GLUCOSE: 94 mg/dL (ref 65–99)
Potassium: 3.4 mmol/L — ABNORMAL LOW (ref 3.5–5.1)
Sodium: 141 mmol/L (ref 135–145)

## 2015-06-11 MED ORDER — FOLIC ACID 1 MG PO TABS
1.0000 mg | ORAL_TABLET | Freq: Every day | ORAL | Status: DC
  Administered 2015-06-12 – 2015-06-14 (×3): 1 mg via ORAL
  Filled 2015-06-11 (×3): qty 1

## 2015-06-11 MED ORDER — LORAZEPAM 1 MG PO TABS
1.0000 mg | ORAL_TABLET | Freq: Four times a day (QID) | ORAL | Status: DC | PRN
  Administered 2015-06-11 – 2015-06-14 (×5): 1 mg via ORAL
  Filled 2015-06-11 (×5): qty 1

## 2015-06-11 MED ORDER — LEVOTHYROXINE SODIUM 100 MCG PO TABS
100.0000 ug | ORAL_TABLET | Freq: Every day | ORAL | Status: DC
  Administered 2015-06-12 – 2015-06-14 (×3): 100 ug via ORAL
  Filled 2015-06-11 (×3): qty 1

## 2015-06-11 MED ORDER — LISINOPRIL 10 MG PO TABS
10.0000 mg | ORAL_TABLET | Freq: Every day | ORAL | Status: DC
  Administered 2015-06-11 – 2015-06-13 (×3): 10 mg via ORAL
  Filled 2015-06-11 (×4): qty 1

## 2015-06-11 MED ORDER — PANTOPRAZOLE SODIUM 40 MG PO TBEC
40.0000 mg | DELAYED_RELEASE_TABLET | Freq: Every day | ORAL | Status: DC
  Administered 2015-06-11 – 2015-06-14 (×4): 40 mg via ORAL
  Filled 2015-06-11 (×4): qty 1

## 2015-06-11 MED ORDER — QUETIAPINE FUMARATE 25 MG PO TABS
25.0000 mg | ORAL_TABLET | Freq: Three times a day (TID) | ORAL | Status: DC | PRN
  Administered 2015-06-12: 25 mg via ORAL
  Filled 2015-06-11: qty 1

## 2015-06-11 MED ORDER — GABAPENTIN 400 MG PO CAPS
800.0000 mg | ORAL_CAPSULE | Freq: Three times a day (TID) | ORAL | Status: DC
  Administered 2015-06-11 – 2015-06-14 (×10): 800 mg via ORAL
  Filled 2015-06-11 (×10): qty 2

## 2015-06-11 MED ORDER — B COMPLEX-C PO TABS
1.0000 | ORAL_TABLET | Freq: Every day | ORAL | Status: DC
  Administered 2015-06-12 – 2015-06-14 (×3): 1 via ORAL
  Filled 2015-06-11 (×3): qty 1

## 2015-06-11 MED ORDER — DULOXETINE HCL 60 MG PO CPEP
60.0000 mg | ORAL_CAPSULE | Freq: Every day | ORAL | Status: DC
  Administered 2015-06-11 – 2015-06-14 (×4): 60 mg via ORAL
  Filled 2015-06-11 (×4): qty 1

## 2015-06-11 MED ORDER — BUTALBITAL-APAP-CAFFEINE 50-325-40 MG PO TABS
1.0000 | ORAL_TABLET | ORAL | Status: DC | PRN
  Administered 2015-06-11 – 2015-06-13 (×7): 1 via ORAL
  Filled 2015-06-11 (×7): qty 1

## 2015-06-11 MED ORDER — POTASSIUM CHLORIDE CRYS ER 20 MEQ PO TBCR
40.0000 meq | EXTENDED_RELEASE_TABLET | Freq: Once | ORAL | Status: AC
  Administered 2015-06-11: 40 meq via ORAL
  Filled 2015-06-11: qty 2

## 2015-06-11 MED ORDER — CLONAZEPAM 1 MG PO TABS
1.0000 mg | ORAL_TABLET | Freq: Three times a day (TID) | ORAL | Status: DC
  Administered 2015-06-11 – 2015-06-14 (×10): 1 mg via ORAL
  Filled 2015-06-11 (×10): qty 1

## 2015-06-11 NOTE — Plan of Care (Signed)
Problem: Education: Goal: Knowledge of [facility] Education information/materials will improve Outcome: Not Progressing Pt confused--no teaching can be done at this time

## 2015-06-11 NOTE — Progress Notes (Signed)
PROGRESS NOTE        PATIENT DETAILS Name: Brittany [name] Age: 50 y.o. Sex: female Date of Birth: [birth year] Admit Date: 06/10/2015 Admitting Physician Clydie Braun, MD BJY:[id number], Chyrl Civatte, MD Outpatient Specialists:None  Brief Narrative: Patient is a 50 y.o. female with PMHx of numerous drug overdoses this year alone presented to the ED on 4/27 with drug overdose with baclofen.   Subjective: Much more awake and alert compared to yesterday." Sorry" that she overdosed again.  Assessment/Plan: Principal Problem: Acute Encephalopathy: secondary to drug overdose with Baclofen. Resolved with supportive care. Follow and monitor closely  Active Problems: Drug overdose:not sure if this is accidental or intentional-patient does not recollect taking a lot of baclofen-but at the same time she is "sorry" for overdosing again. Continue sitter at bedside-she has had multiple drug overdoses with suicidal intent this year alone. Psychiatry consulted, recommendations are to transfer to inpatient psychiatry unit.  Anxiety: Ativan every 6 hours PRN  Hypernatremia: Appears to have resolved with gentle hydration.  Sinus Bradycardia: Appears to have resolved.   Hx of Depression/Bipolar disorder: All psych meds were held on admission-discussed with psychiatry-okay to resume  Klonopin, Seroquel (takes prn), and Cymbalta   Hx of chronic pain/neuropathy: resume Neurontin  Hypothyroidism:continue levothyroxin  HTN: Resume lisinopril-resume HCTZ over the next few days   DVT Prophylaxis: Prophylactic Lovenox   Code Status: Full code  Family Communication: None at bedside  Disposition Plan: Remain inpatient-inpatient psych unit when a bed available-likely in the next few days  Antimicrobial agents: None  Procedures: None  CONSULTS:  None  Time spent: 20 minutes-Greater than 50% of this time was spent in counseling, explanation of diagnosis, planning of  further management, and coordination of care.  MEDICATIONS: Anti-infectives    None      Scheduled Meds: . enoxaparin (LOVENOX) injection  40 mg Subcutaneous Q24H  . levothyroxine  50 mcg Intravenous QAC breakfast  . sodium chloride flush  3 mL Intravenous Q12H   Continuous Infusions: . sodium chloride    . dextrose 5 % and 0.45% NaCl 100 mL/hr at 06/10/15 1633   PRN Meds:.acetaminophen, albuterol, hydrALAZINE, LORazepam, ondansetron **OR** ondansetron (ZOFRAN) IV   PHYSICAL EXAM: Vital signs: Filed Vitals:   06/10/15 2235 06/11/15 0457 06/11/15 0650 06/11/15 0900  BP: 134/76 141/71 130/80   Pulse: 83 79 85   Temp: 98.2 F (36.8 C) 98.2 F (36.8 C) 98.4 F (36.9 C)   TempSrc:  Oral Oral   Resp: Height:     (1.626 m)  Weight:    105.5 kg (232 lb 9.4 oz)  SpO2: 96% 98% 98%    Filed Weights   06/11/15 0900  Weight: 105.5 kg (232 lb 9.4 oz)   Body mass index is 39.9 kg/(m^2).   Gen Exam: Much more awake and alert from initial exam.   Neck: Supple Chest: B/L Clear.   CVS: S1 S2 Regular, no murmurs.  Abdomen: soft, BS +, non tender, non distended.  Extremities: no edema, lower extremities warm to touch. Neurologic: Difficult exam-but moves all 4 ext Skin: No Rash or lesions   Wounds: N/A.    LABORATORY DATA: CBC:  Recent Labs Lab 06/10/15 0224 06/10/15 0300 06/11/15 0637  WBC  --  7.9 7.3  NEUTROABS  --  5.6  --   HGB 13.9 13.0 12.7  HCT 41.0 39.8 38.8  MCV  --  90.9 90.0  PLT  --  343 343    Basic Metabolic Panel:  Recent Labs Lab 06/10/15 0224 06/10/15 0300 06/11/15 0637  NA 151* 148* 141  K 3.9 3.7 3.4*  CL 110 112* 106  CO2  --  25 24  GLUCOSE 111* 108* 94  BUN 10 8 5*  CREATININE 0.50 0.61 0.66  CALCIUM  --  9.2 9.4    GFR: Estimated Creatinine Clearance: 101.8 mL/min (by C-G formula based on Cr of 0.66).  Liver Function Tests:  Recent Labs Lab 06/10/15 0300  AST 32  ALT 38  ALKPHOS 72  BILITOT 0.2*  PROT  7.7  ALBUMIN 4.0   No results for input(s): LIPASE, AMYLASE in the last 168 hours. No results for input(s): AMMONIA in the last 168 hours.  Coagulation Profile: No results for input(s): INR, PROTIME in the last 168 hours.  Cardiac Enzymes:  Recent Labs Lab 06/10/15 0300  CKTOTAL 250*    BNP (last 3 results) No results for input(s): PROBNP in the last 8760 hours.  HbA1C: No results for input(s): HGBA1C in the last 72 hours.  CBG:  Recent Labs Lab 06/10/15 0202  GLUCAP 111*    Lipid Profile: No results for input(s): CHOL, HDL, LDLCALC, TRIG, CHOLHDL, LDLDIRECT in the last 72 hours.  Thyroid Function Tests: No results for input(s): TSH, T4TOTAL, FREET4, T3FREE, THYROIDAB in the last 72 hours.  Anemia Panel: No results for input(s): VITAMINB12, FOLATE, FERRITIN, TIBC, IRON, RETICCTPCT in the last 72 hours.  Urine analysis:    Component Value Date/Time   COLORURINE YELLOW 06/10/2015 0253   COLORURINE Yellow 04/25/2011 1804   APPEARANCEUR CLOUDY* 06/10/2015 0253   APPEARANCEUR Clear 04/25/2011 1804   LABSPEC 1.021 06/10/2015 0253   LABSPEC 1.024 04/25/2011 1804   PHURINE 7.0 06/10/2015 0253   PHURINE 6.0 04/25/2011 1804   GLUCOSEU NEGATIVE 06/10/2015 0253   GLUCOSEU Negative 04/25/2011 1804   HGBUR NEGATIVE 06/10/2015 0253   HGBUR Negative 04/25/2011 1804   BILIRUBINUR NEGATIVE 06/10/2015 0253   BILIRUBINUR Negative 04/25/2011 1804   KETONESUR NEGATIVE 06/10/2015 0253   KETONESUR Negative 04/25/2011 1804   PROTEINUR NEGATIVE 06/10/2015 0253   PROTEINUR Negative 04/25/2011 1804   UROBILINOGEN 0.2 12/23/2014 1048   NITRITE NEGATIVE 06/10/2015 0253   NITRITE Negative 04/25/2011 1804   LEUKOCYTESUR NEGATIVE 06/10/2015 0253   LEUKOCYTESUR Negative 04/25/2011 1804    Sepsis Labs: Lactic Acid, Venous    Component Value Date/Time   LATICACIDVEN 1.6 04/20/2015 1827    MICROBIOLOGY: No results found for this or any previous visit (from the past 240  hour(s)).  RADIOLOGY STUDIES/RESULTS: Ct Head Wo Contrast  06/10/2015  CLINICAL DATA:  Found down.  Unresponsive. EXAM: CT HEAD WITHOUT CONTRAST CT CERVICAL SPINE WITHOUT CONTRAST TECHNIQUE: Multidetector CT imaging of the head and cervical spine was performed following the standard protocol without intravenous contrast. Multiplanar CT image reconstructions of the cervical spine were also generated. COMPARISON:  03/25/2015 head CT FINDINGS: CT HEAD FINDINGS Skull and Sinuses:No acute fracture or destructive process. Remote right orbital floor blow-out fracture continuing along the anterior maxilla. Right nasal trumpet is located. Visualized orbits: Negative. Brain: Normal. No evidence of acute infarction, hemorrhage, hydrocephalus, or mass lesion/mass effect. CT CERVICAL SPINE FINDINGS Motion artifact at the level of C7 and the thoracic spine. Cervical spine imaging is overall diagnostic. No evidence of acute fracture or traumatic malalignment. No gross canal hematoma or prevertebral edema. Atelectasis seen in  the left upper lobe. IMPRESSION: No evidence of acute intracranial or cervical spine injury. Electronically Signed   By: Marnee Spring M.D.   On: 06/10/2015 03:52   Ct Cervical Spine Wo Contrast  06/10/2015  CLINICAL DATA:  Found down.  Unresponsive. EXAM: CT HEAD WITHOUT CONTRAST CT CERVICAL SPINE WITHOUT CONTRAST TECHNIQUE: Multidetector CT imaging of the head and cervical spine was performed following the standard protocol without intravenous contrast. Multiplanar CT image reconstructions of the cervical spine were also generated. COMPARISON:  03/25/2015 head CT FINDINGS: CT HEAD FINDINGS Skull and Sinuses:No acute fracture or destructive process. Remote right orbital floor blow-out fracture continuing along the anterior maxilla. Right nasal trumpet is located. Visualized orbits: Negative. Brain: Normal. No evidence of acute infarction, hemorrhage, hydrocephalus, or mass lesion/mass effect. CT  CERVICAL SPINE FINDINGS Motion artifact at the level of C7 and the thoracic spine. Cervical spine imaging is overall diagnostic. No evidence of acute fracture or traumatic malalignment. No gross canal hematoma or prevertebral edema. Atelectasis seen in the left upper lobe. IMPRESSION: No evidence of acute intracranial or cervical spine injury. Electronically Signed   By: Marnee Spring M.D.   On: 06/10/2015 03:52   Dg Chest Portable 1 View  06/10/2015  CLINICAL DATA:  Patient found down. Unresponsive. Initial encounter. EXAM: PORTABLE CHEST 1 VIEW COMPARISON:  Chest radiograph from 04/22/2015 FINDINGS: The lungs are well-aerated. Mild vascular congestion is noted. Mild bibasilar atelectasis is seen. There is no evidence of pleural effusion or pneumothorax. The cardiomediastinal silhouette is borderline enlarged. No acute osseous abnormalities are seen. IMPRESSION: Mild vascular congestion and borderline cardiomegaly noted. Mild bibasilar atelectasis seen. Electronically Signed   By: Roanna Raider M.D.   On: 06/10/2015 02:37       Windell Norfolk, MD  [facility] Pager:[phone]  If 7PM-7AM, please contact night-coverage [email or web address] Password [facility] 06/11/2015, 12:20 PM

## 2015-06-11 NOTE — Consult Note (Signed)
[facility] Consult   Reason for Consult:  Bipolar disorder and intentional drug overdose Referring Physician:  Dr. Sloan Leiter Patient Identification: Brittany [name] MRN:  [id number] Principal Diagnosis: Drug overdose Diagnosis:   Patient Active Problem List   Diagnosis Date Noted  . Sinus bradycardia [R00.1] 06/10/2015  . Hypernatremia [E87.0] 06/10/2015  . Prolonged QT interval [I45.81] 05/03/2015  . Bipolar disorder, curr episode mixed, severe, w/o psychotic features ([place]) [F31.63] 04/29/2015  . Morbid obesity due to excess calories ([place]) [E66.01]   . Esophageal reflux [K21.9]   . Migraine without aura and without status migrainosus, not intractable [G43.009]   . Acute respiratory failure with hypoxia ([place]) [J96.01] 04/23/2015  . Overdose [T50.901A] 03/23/2015  . Postextubation stridor [T85.89XA, R06.1]   . History of hepatitis C virus infection [Z86.19] 02/26/2015  . Acute respiratory failure ([place]) [J96.00]   . Endotracheally intubated [Z78.9]   . Acute encephalopathy [G93.40] 02/24/2015  . Drug overdose [T50.901A] 02/24/2015  . 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 Right facial pain [R51] 01/05/2015  . Orbit fracture ([place]) [S02.80XA] 11/30/2013  . Polypharmacy [Z79.899] 01/18/2012    Class: Chronic  . Borderline personality disorder [F60.3] 01/18/2012  . Pyelonephritis [N12] 09/16/2011  . Hypothyroidism [E03.9] 09/16/2011    Total Time spent with patient: 1 hour  Subjective:   Brittany [name] is a 50 y.o. female patient admitted with intentional drug overdose.  HPI:  Brittany [name] is a 50 y.o. female with medical history significant of bipolar disorder, HTN, hypothyroidism, and chronic lower back pain admitted to [facility] status post intervention drug overdose. Patient seen, chart reviewed and case discussed with Dr. Sloan Leiter for these face-to-face psychiatric consultation and evaluation of intentional drug overdose. Patient picked up a new  prescription on 05/29/2015 of baclofen and was found to have ingested 5500 mg within the last 36 hours. Patient was found by her fianc on the ground foaming at the mouth and he called 911.   Patient is awake, alert, oriented to time place person and situation. Patient endorses intentional drug overdose and also reportedly stressed about her current relationship with her fianc who has been drinking, doing drugs and then staying with a [place] friend who gives food to him. Reportedly also takes her medication from disability funds. Patient is also depressed about not able to see her 70 years old daughter who was adopted out of state. Patient's overall intent was not clear,even though she makes statement she does not have a suicidal ideation but upon reviewing the patient's medical record it appears she has had multiple admissions to behavioral health with previous overdoses in the past. Patient was seen during the month of March with a similar clinical presentation and [facility] then she was committed to [facility]. Patient stated that she would like to go to behavioral [place] voluntarily then being committed this time. When patient was advised regarding out-of-home placement she is refused and stated she had a no loan on her house and does not want to  go out of the house. Patient is willing to give up her medication administration to her fianc who is not reliable or dependaable. Patient was evaluated and seen have positive urine drug screen for benzodiazepines.   Past Psychiatric History: Bipolar disorder and multiple acute psych admission at [facility] and recent admission in 04/2015 for poly drug overdose followed by verbal altercation.   Risk to Self: Is patient at risk for suicide?: Yes Risk to Others:   Prior  Inpatient Therapy:   Prior Outpatient Therapy:    Past Medical History:  Past Medical History  Diagnosis Date  . Hypertension   . Bipolar 1 disorder ([place])    . Anxiety   . Bulging lumbar disc   . Ruptured intervertebral disc   . Head injury   . Short-term memory loss   . Depression   . Hypothyroidism   . Goiter   . Facial pain 11/2013    walking dog when she tripped and "faceplanted" landing on her face.   . Facial nerve injury   . Hyperlipidemia   . Heart murmur   . Chronic bronchitis ([place])   . GERD (gastroesophageal reflux disease)   . Migraine     "weekly" (03/23/2015)  . Seizures ([place])     "I've had a few; had a grand mal when I took Neurotin; still take Neurotin" (03/23/2015)  . Chronic lower back pain     Past Surgical History  Procedure Laterality Date  . Tonsillectomy    . Abdominal hysterectomy    . Cesarean section  2004  . Eye surgery Right 11/2013    emergent lateral canthotomy due to proptosis  /notes 11/30/2013   Family History:  Family History  Problem Relation Age of Onset  . Migraines Mother   . Migraines Father   . Other Father   . Migraines Sister    Family Psychiatric  History: Unknown Social History:  History  Alcohol Use  . Yes    Comment: 03/23/2015 "might drink at Christmastime"     History  Drug Use No    Comment: 03/23/2015 ""haven't smoked marijuana for years"    Social History   Social History  . Marital Status: Married    Spouse Name: N/A  . Number of Children: 1  . Years of Education: 16   Occupational History  . Unemployed    Social History Main Topics  . Smoking status: Former Smoker    Types: Cigarettes  . Smokeless tobacco: Never Used     Comment: "smoked cigarettes when I was 50 years old"  . Alcohol Use: Yes     Comment: 03/23/2015 "might drink at Christmastime"  . Drug Use: No     Comment: 03/23/2015 ""haven't smoked marijuana for years"  . Sexual Activity: Yes    Birth Control/ Protection: Condom   Other Topics Concern  . None   Social History Narrative   Lives at home with her fiancee.   Right-handed.   No caffeine use.   Additional Social History:    Allergies:    Allergies  Allergen Reactions  . Toradol [Ketorolac Tromethamine] Swelling  . Suboxone [Buprenorphine Hcl-Naloxone Hcl] Other (See Comments)    cellulitis   . Trazodone And Nefazodone Other (See Comments)    Bladder infection    Labs:  Results for orders placed or performed during the hospital encounter of 06/10/15 (from the past 48 hour(s))  CBG monitoring, ED     Status: Abnormal   Collection Time: 06/10/15  2:02 AM  Result Value Ref Range   Glucose-Capillary 111 (H) 65 - 99 mg/dL  Acetaminophen level     Status: Abnormal   Collection Time: 06/10/15  2:05 AM  Result Value Ref Range   Acetaminophen (Tylenol), Serum <10 (L) 10 - 30 ug/mL    Comment:        THERAPEUTIC CONCENTRATIONS VARY SIGNIFICANTLY. A RANGE OF 10-30 ug/mL MAY BE AN EFFECTIVE CONCENTRATION FOR MANY PATIENTS. HOWEVER, SOME ARE BEST TREATED AT  CONCENTRATIONS OUTSIDE THIS RANGE. ACETAMINOPHEN CONCENTRATIONS >150 ug/mL AT 4 HOURS AFTER INGESTION AND >50 ug/mL AT 12 HOURS AFTER INGESTION ARE OFTEN ASSOCIATED WITH TOXIC REACTIONS.   Ethanol     Status: None   Collection Time: 06/10/15  2:05 AM  Result Value Ref Range   Alcohol, Ethyl (B) <5 <5 mg/dL    Comment:        LOWEST DETECTABLE LIMIT FOR SERUM ALCOHOL IS 5 mg/dL FOR MEDICAL PURPOSES ONLY   Salicylate level     Status: None   Collection Time: 06/10/15  2:05 AM  Result Value Ref Range   Salicylate Lvl <4.1 2.8 - 30.0 mg/dL  I-Stat Chem 8, ED  (not at [facility], [facility])     Status: Abnormal   Collection Time: 06/10/15  2:24 AM  Result Value Ref Range   Sodium 151 (H) 135 - 145 mmol/L   Potassium 3.9 3.5 - 5.1 mmol/L   Chloride 110 101 - 111 mmol/L   BUN 10 6 - 20 mg/dL   Creatinine, Ser 0.50 0.44 - 1.00 mg/dL   Glucose, Bld 111 (H) 65 - 99 mg/dL   Calcium, Ion 1.16 1.12 - 1.23 mmol/L   TCO2 30 0 - 100 mmol/L   Hemoglobin 13.9 12.0 - 15.0 g/dL   HCT 41.0 36.0 - 46.0 %  Urine rapid drug screen (hosp performed)not at [facility]     Status: Abnormal    Collection Time: 06/10/15  2:53 AM  Result Value Ref Range   Opiates NONE DETECTED NONE DETECTED   Cocaine NONE DETECTED NONE DETECTED   Benzodiazepines POSITIVE (A) NONE DETECTED   Amphetamines NONE DETECTED NONE DETECTED   Tetrahydrocannabinol NONE DETECTED NONE DETECTED   Barbiturates NONE DETECTED NONE DETECTED    Comment:        DRUG SCREEN FOR MEDICAL PURPOSES ONLY.  IF CONFIRMATION IS NEEDED FOR ANY PURPOSE, NOTIFY LAB WITHIN 5 DAYS.        LOWEST DETECTABLE LIMITS FOR URINE DRUG SCREEN Drug Class       Cutoff (ng/mL) Amphetamine      1000 Barbiturate      200 Benzodiazepine   324 Tricyclics       401 Opiates          300 Cocaine          300 THC              50   Urinalysis, Routine w reflex microscopic (not at [facility])     Status: Abnormal   Collection Time: 06/10/15  2:53 AM  Result Value Ref Range   Color, Urine YELLOW YELLOW   APPearance CLOUDY (A) CLEAR   Specific Gravity, Urine 1.021 1.005 - 1.030   pH 7.0 5.0 - 8.0   Glucose, UA NEGATIVE NEGATIVE mg/dL   Hgb urine dipstick NEGATIVE NEGATIVE   Bilirubin Urine NEGATIVE NEGATIVE   Ketones, ur NEGATIVE NEGATIVE mg/dL   Protein, ur NEGATIVE NEGATIVE mg/dL   Nitrite NEGATIVE NEGATIVE   Leukocytes, UA NEGATIVE NEGATIVE    Comment: MICROSCOPIC NOT DONE ON URINES WITH NEGATIVE PROTEIN, BLOOD, LEUKOCYTES, NITRITE, OR GLUCOSE <1000 mg/dL.  Comprehensive metabolic panel     Status: Abnormal   Collection Time: 06/10/15  3:00 AM  Result Value Ref Range   Sodium 148 (H) 135 - 145 mmol/L   Potassium 3.7 3.5 - 5.1 mmol/L   Chloride 112 (H) 101 - 111 mmol/L   CO2 25 22 - 32 mmol/L   Glucose, Bld 108 (H) 65 -  99 mg/dL   BUN 8 6 - 20 mg/dL   Creatinine, Ser 0.61 0.44 - 1.00 mg/dL   Calcium 9.2 8.9 - 10.3 mg/dL   Total Protein 7.7 6.5 - 8.1 g/dL   Albumin 4.0 3.5 - 5.0 g/dL   AST 32 15 - 41 U/L   ALT 38 14 - 54 U/L   Alkaline Phosphatase 72 38 - 126 U/L   Total Bilirubin 0.2 (L) 0.3 - 1.2 mg/dL   GFR calc non Af Amer  >60 >60 mL/min   GFR calc Af Amer >60 >60 mL/min    Comment: (NOTE) The eGFR has been calculated using the CKD EPI equation. This calculation has not been validated in all clinical situations. eGFR's persistently <60 mL/min signify possible Chronic Kidney Disease.    Anion gap 11 5 - 15  CBC WITH DIFFERENTIAL     Status: None   Collection Time: 06/10/15  3:00 AM  Result Value Ref Range   WBC 7.9 4.0 - 10.5 K/uL   RBC 4.38 3.87 - 5.11 MIL/uL   Hemoglobin 13.0 12.0 - 15.0 g/dL   HCT 39.8 36.0 - 46.0 %   MCV 90.9 78.0 - 100.0 fL   MCH 29.7 26.0 - 34.0 pg   MCHC 32.7 30.0 - 36.0 g/dL   RDW 13.9 11.5 - 15.5 %   Platelets 343 150 - 400 K/uL   Neutrophils Relative % 71 %   Neutro Abs 5.6 1.7 - 7.7 K/uL   Lymphocytes Relative 20 %   Lymphs Abs 1.6 0.7 - 4.0 K/uL   Monocytes Relative 8 %   Monocytes Absolute 0.6 0.1 - 1.0 K/uL   Eosinophils Relative 1 %   Eosinophils Absolute 0.1 0.0 - 0.7 K/uL   Basophils Relative 0 %   Basophils Absolute 0.0 0.0 - 0.1 K/uL  CK     Status: Abnormal   Collection Time: 06/10/15  3:00 AM  Result Value Ref Range   Total CK 250 (H) 38 - 234 U/L  Basic metabolic panel     Status: Abnormal   Collection Time: 06/11/15  6:37 AM  Result Value Ref Range   Sodium 141 135 - 145 mmol/L   Potassium 3.4 (L) 3.5 - 5.1 mmol/L   Chloride 106 101 - 111 mmol/L   CO2 24 22 - 32 mmol/L   Glucose, Bld 94 65 - 99 mg/dL   BUN 5 (L) 6 - 20 mg/dL   Creatinine, Ser 0.66 0.44 - 1.00 mg/dL   Calcium 9.4 8.9 - 10.3 mg/dL   GFR calc non Af Amer >60 >60 mL/min   GFR calc Af Amer >60 >60 mL/min    Comment: (NOTE) The eGFR has been calculated using the CKD EPI equation. This calculation has not been validated in all clinical situations. eGFR's persistently <60 mL/min signify possible Chronic Kidney Disease.    Anion gap 11 5 - 15  CBC     Status: None   Collection Time: 06/11/15  6:37 AM  Result Value Ref Range   WBC 7.3 4.0 - 10.5 K/uL   RBC 4.31 3.87 - 5.11 MIL/uL    Hemoglobin 12.7 12.0 - 15.0 g/dL   HCT 38.8 36.0 - 46.0 %   MCV 90.0 78.0 - 100.0 fL   MCH 29.5 26.0 - 34.0 pg   MCHC 32.7 30.0 - 36.0 g/dL   RDW 13.9 11.5 - 15.5 %   Platelets 343 150 - 400 K/uL    Current Facility-Administered Medications  Medication Dose  Route Frequency Provider Last Rate Last Dose  . 0.9 %  sodium chloride infusion   Intravenous Continuous Kristen N Ward, DO      . acetaminophen (TYLENOL) tablet 650 mg  650 mg Oral Q6H PRN Jonetta Osgood, MD   650 mg at 06/11/15 0656  . albuterol (PROVENTIL) (2.5 MG/3ML) 0.083% nebulizer solution 2.5 mg  2.5 mg Nebulization Q2H PRN Rondell A Tamala Julian, MD      . dextrose 5 %-0.45 % sodium chloride infusion   Intravenous Continuous Norval Morton, MD 100 mL/hr at 06/10/15 1633    . enoxaparin (LOVENOX) injection 40 mg  40 mg Subcutaneous Q24H Rondell A Tamala Julian, MD   40 mg at 06/10/15 1628  . hydrALAZINE (APRESOLINE) injection 10 mg  10 mg Intravenous Q4H PRN Rondell A Tamala Julian, MD      . levothyroxine (SYNTHROID, LEVOTHROID) injection 50 mcg  50 mcg Intravenous QAC breakfast Jonetta Osgood, MD   50 mcg at 06/11/15 0800  . LORazepam (ATIVAN) tablet 1 mg  1 mg Oral Q6H PRN Dianne Dun, NP   1 mg at 06/11/15 0210  . ondansetron (ZOFRAN) tablet 4 mg  4 mg Oral Q6H PRN Norval Morton, MD       Or  . ondansetron (ZOFRAN) injection 4 mg  4 mg Intravenous Q6H PRN Norval Morton, MD      . sodium chloride flush (NS) 0.9 % injection 3 mL  3 mL Intravenous Q12H Norval Morton, MD   3 mL at 06/10/15 2204    Musculoskeletal: Strength & Muscle Tone: decreased Gait & Station: unable to stand Patient leans: N/A  Psychiatric Specialty Exam: ROS complaining of back pain, facial pain but denied chest pain and shortness of breath No Fever-chills, No Headache, No changes with Vision or hearing, reports vertigo No problems swallowing food or Liquids, No Chest pain, Cough or Shortness of Breath, No Abdominal pain, No Nausea or  Vommitting, Bowel movements are regular, No Blood in stool or Urine, No dysuria, No new skin rashes or bruises, No new joints pains-aches,  No new weakness, tingling, numbness in any extremity, No recent weight gain or loss, No polyuria, polydypsia or polyphagia,   A full 10 point Review of Systems was done, except as stated above, all other Review of Systems were negative.  Blood pressure 130/80, pulse 85, temperature 98.4 F (36.9 C), temperature source Oral, resp. rate 16, height _0  (1.626 m), weight 105.5 kg (232 lb 9.4 oz), SpO2 98 %.Body mass index is 39.9 kg/(m^2).  General Appearance: Guarded  Eye Contact::  Good  Speech:  Clear and Coherent  Volume:  Decreased  Mood:  Depressed  Affect:  Constricted and Depressed  Thought Process:  Coherent and Goal Directed  Orientation:  Full (Time, Place, and Person)  Thought Content:  Rumination  Suicidal Thoughts:  Patient denied suicidal ideation and does not know the intent of taking overdose of muscle relaxants/whole bottle   Homicidal Thoughts:  No  Memory:  Immediate;   Fair Recent;   Poor  Judgement:  Impaired  Insight:  Fair  Psychomotor Activity:  Decreased  Concentration:  Fair  Recall:  Good  Fund of Knowledge:Good  Language: Good  Akathisia:  Negative  Handed:  Right  AIMS (if indicated):     Assets:  Communication Skills Desire for Improvement Financial Resources/Insurance Housing Leisure Time Resilience Social Support Transportation  ADL's:  Impaired  Cognition: WNL  Sleep:      Treatment  Plan Summary: Daily contact with patient to assess and evaluate symptoms and progress in treatment and Medication management  Continue safety sitter as patient has intentional drug overdose and cannot contract for safety Restart psychiatric medication from home for bipolar depression  Appreciate psychiatric consultation and follow up as clinically required Please contact [phone] or [phone] if needs further  assistance  Disposition: Refer to the unit social worker to contact behavioral [place] for placement Recommend psychiatric Inpatient admission when medically cleared. Supportive therapy provided about ongoing stressors.  Durward Parcel., MD 06/11/2015 9:45 AM

## 2015-06-11 NOTE — Care Management Note (Addendum)
Case Management Note  Patient Details  Name: Brittany [name] MRN: [id number] Date of Birth: December 03, 1965  Subjective/Objective:                 50 y.o. female with PMHx of numerous drug overdoses this year alone presented to the ED on 4/27 with drug overdose with baclofen. Lives with fiance'. Independent with ADL's, no DME usage.   Action/Plan: 06/13/2015 - awaiting inpatient psych bed [place] precautions, psych following  Expected Discharge Date:                  Expected Discharge Plan:  Home/Self Care  In-House Referral:  Clinical Social Work  Discharge planning Services  CM Consult  Post Acute Care Choice:    Choice offered to:     DME Arranged:    DME Agency:     HH Arranged:    HH Agency:     Status of Service:  In process, will continue to follow  Medicare Important Message Given:    Date Medicare IM Given:    Medicare IM give by:    Date Additional Medicare IM Given:    Additional Medicare Important Message give by:     If discussed at Long Length of Stay Meetings, dates discussed:    Additional Comments:  Epifanio LeschesCole, Jerad Dunlap Hudson, RN 06/11/2015, 2:39 PM

## 2015-06-12 DIAGNOSIS — I10 Essential (primary) hypertension: Secondary | ICD-10-CM | POA: Insufficient documentation

## 2015-06-12 DIAGNOSIS — T50901D Poisoning by unspecified drugs, medicaments and biological substances, accidental (unintentional), subsequent encounter: Secondary | ICD-10-CM

## 2015-06-12 MED ORDER — HYDROCHLOROTHIAZIDE 25 MG PO TABS
25.0000 mg | ORAL_TABLET | Freq: Every day | ORAL | Status: DC
  Administered 2015-06-12 – 2015-06-13 (×2): 25 mg via ORAL
  Filled 2015-06-12 (×3): qty 1

## 2015-06-12 NOTE — Progress Notes (Addendum)
PROGRESS NOTE    Brittany [name]  NWG:[id number] DOB: 07/12/1965 DOA: 06/10/2015 PCP: Evelene [name], MD    Brief Narrative:  Patient is a 50 y.o. female with PMHx of numerous drug overdoses this year alone presented to the ED on 4/27 with drug overdose with baclofen.   Assessment & Plan:   Drug overdose - She reports to me today that this was accidental, due to severe pain from trigeminal neuralgia.  She is not in pain today - Awaiting bed at inpatient psychiatry - Continue sitter - Awake and alert today, mildly hypertensive, pulse is normal - Patient is medically stable for discharge to inpatient psych when bed available.  Bipolar disorder/Depression/Anxiety - continue home meds of Klonopin, seroquel, cymbalta - Psychiatry following    Sinus bradycardia - Improved, possibly related to overdose    Hypernatremia - Resolved with hydration  Chronic trigeminal neuralgia pain - Gabapentin   H/O HTN - Resumed home lisinopril - Start HCTZ today, her QT prolongation improved on repeat EKG on 4/27   DVT prophylaxis: Lovenox  Code Status: Full   Disposition Plan: Pending bed at [facility]    Consultants:   Psychiatry   Procedures:   None  Antimicrobials:   none    Subjective: Patient reports to me today that she did not intentionally overdose and she was just trying to treat her trigeminal neuralgia pain.  She is interested in rescheduling her neurology appointment to get follow up for her neuralgia.   Objective: Filed Vitals:   06/11/15 0900 06/11/15 1449 06/11/15 2219 06/12/15 0421  BP:  143/92 136/93 145/79  Pulse:  81 78 80  Temp:  98.9 F (37.2 C) 98.8 F (37.1 C) 98.4 F (36.9 C)  TempSrc:    Oral  Resp:  Height:  (1.626 m)     Weight: 232 lb 9.4 oz (105.5 kg)     SpO2:   99% 98%    Intake/Output Summary (Last 24 hours) at 06/12/15 0736 Last data filed at 06/11/15 1859  Gross per 24 hour  Intake    600 ml  Output      0 ml    Net    600 ml   Filed Weights   06/11/15 0900  Weight: 232 lb 9.4 oz (105.5 kg)    Examination:  General exam: Appears calm and comfortable  Respiratory system: Clear to auscultation. Respiratory effort normal. Cardiovascular system: S1 & S2 heard, RRR.  Gastrointestinal system: Abdomen is nondistended, soft and nontender.  Normal bowel sounds heard. Central nervous system: Alert and oriented. No focal neurological deficits. Skin: No rashes, lesions or ulcers Psychiatry: Judgement and insight appear somewhat diminished. Mood & affect appropriate.     Data Reviewed: I have personally reviewed following labs and imaging studies  CBC:  Recent Labs Lab 06/10/15 0224 06/10/15 0300 06/11/15 0637  WBC  --  7.9 7.3  NEUTROABS  --  5.6  --   HGB 13.9 13.0 12.7  HCT 41.0 39.8 38.8  MCV  --  90.9 90.0  PLT  --  343 343   Basic Metabolic Panel:  Recent Labs Lab 06/10/15 0224 06/10/15 0300 06/11/15 0637  NA 151* 148* 141  K 3.9 3.7 3.4*  CL 110 112* 106  CO2  --  25 24  GLUCOSE 111* 108* 94  BUN 10 8 5*  CREATININE 0.50 0.61 0.66  CALCIUM  --  9.2 9.4   GFR: Estimated Creatinine Clearance: 101.8 mL/min (by C-G formula  based on Cr of 0.66). Liver Function Tests:  Recent Labs Lab 06/10/15 0300  AST 32  ALT 38  ALKPHOS 72  BILITOT 0.2*  PROT 7.7  ALBUMIN 4.0   Cardiac Enzymes:  Recent Labs Lab 06/10/15 0300  CKTOTAL 250*   CBG:  Recent Labs Lab 06/10/15 0202  GLUCAP 111*   Urine analysis:    Component Value Date/Time   COLORURINE YELLOW 06/10/2015 0253   COLORURINE Yellow 04/25/2011 1804   APPEARANCEUR CLOUDY* 06/10/2015 0253   APPEARANCEUR Clear 04/25/2011 1804   LABSPEC 1.021 06/10/2015 0253   LABSPEC 1.024 04/25/2011 1804   PHURINE 7.0 06/10/2015 0253   PHURINE 6.0 04/25/2011 1804   GLUCOSEU NEGATIVE 06/10/2015 0253   GLUCOSEU Negative 04/25/2011 1804   HGBUR NEGATIVE 06/10/2015 0253   HGBUR Negative 04/25/2011 1804   BILIRUBINUR  NEGATIVE 06/10/2015 0253   BILIRUBINUR Negative 04/25/2011 1804   KETONESUR NEGATIVE 06/10/2015 0253   KETONESUR Negative 04/25/2011 1804   PROTEINUR NEGATIVE 06/10/2015 0253   PROTEINUR Negative 04/25/2011 1804   UROBILINOGEN 0.2 12/23/2014 1048   NITRITE NEGATIVE 06/10/2015 0253   NITRITE Negative 04/25/2011 1804   LEUKOCYTESUR NEGATIVE 06/10/2015 0253   LEUKOCYTESUR Negative 04/25/2011 1804       Radiology Studies: No results found.      Scheduled Meds: . B-complex with vitamin C  1 tablet Oral Daily  . clonazePAM  1 mg Oral TID  . DULoxetine  60 mg Oral Daily  . enoxaparin (LOVENOX) injection  40 mg Subcutaneous Q24H  . folic acid  1 mg Oral Daily  . gabapentin  800 mg Oral TID  . levothyroxine  100 mcg Oral QAC breakfast  . lisinopril  10 mg Oral Daily  . pantoprazole  40 mg Oral Daily  . sodium chloride flush  3 mL Intravenous Q12H   Continuous Infusions: . dextrose 5 % and 0.45% NaCl 100 mL/hr at 06/10/15 1633     LOS: 1 day    Time spent: 25    Debe CoderMULLEN, EMILY, MD [facility] Pager [phone]  If 7PM-7AM, please contact night-coverage [email or web address] Password [email or web address] 06/12/2015, 7:36 AM

## 2015-06-12 NOTE — Progress Notes (Signed)
Per MD note pt is medically stable for transfer to inpatient psych facility  Ref[facility] the following facilities:  [facility]- [facility]- referral faxed [facility]- referral faxed [facility]- referral faxed [facility]- referral faxed  CSW will continue to follow  Merlyn LotJenna Holoman, LCSWA Clinical Social Worker [phone] (wknd number)

## 2015-06-13 DIAGNOSIS — T50901A Poisoning by unspecified drugs, medicaments and biological substances, accidental (unintentional), initial encounter: Secondary | ICD-10-CM

## 2015-06-13 MED ORDER — OXYCODONE-ACETAMINOPHEN 7.5-325 MG PO TABS
1.0000 | ORAL_TABLET | ORAL | Status: DC | PRN
  Administered 2015-06-13 – 2015-06-14 (×8): 1 via ORAL
  Filled 2015-06-13 (×8): qty 1

## 2015-06-13 NOTE — Progress Notes (Signed)
PROGRESS NOTE    Brittany [name]  ZOX:[id number] DOB: June 02, 1965 DOA: 06/10/2015 PCP: [name], [name], MD    Brief Narrative:  Patient is a 50 y.o. female with PMHx of numerous drug overdoses this year alone presented to the ED on 4/27 with drug overdose with baclofen.   Assessment & Plan:   Drug overdose - Awaiting bed at inpatient psychiatry - Continue sitter - Awake and alert today, no blood pressure issues - Patient is medically stable for discharge to inpatient psych when bed available.  Bipolar disorder/Depression/Anxiety - continue home meds of Klonopin, seroquel, cymbalta - Psychiatry following    Sinus bradycardia - Improved, possibly related to overdose    Hypernatremia - Resolved with hydration  Chronic trigeminal neuralgia pain - Gabapentin  - She requested some prn pain medication and was provided with percocet - She has a disconjugate gaze at baseline and some aniscoria.  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  Daily eye exams for any concerning changes  H/O HTN - Home HCTZ and lisinopril were started   DVT prophylaxis: Lovenox  Code Status: Full   [facility]    Consultants:   Psychiatry   Procedures:   None  Antimicrobials:   none    Subjective: Patient with worsening trigeminal neuralgia pain.  She seems to be in significant pain with eye tearing.    Objective: Filed Vitals:   06/12/15 0421 06/12/15 1300 06/12/15 2154 06/13/15 0553  BP: 145/79 150/86 119/74 127/83  Pulse: 80 94 85 86  Temp: 98.4 F (36.9 C) 97.7 F (36.5 C) 97.5 F (36.4 C) 97.4 F (36.3 C)  TempSrc: Oral Oral  Oral  Resp: 17 17 18 18   Height:      Weight:      SpO2: 98% 94% 98% 96%    Intake/Output Summary (Last 24 hours) at 06/13/15 0927 Last data filed at 06/13/15 0751  Gross per 24 hour  Intake    780 ml  Output      0 ml  Net    780 ml   Filed Weights   06/11/15 0900  Weight: 232 lb 9.4 oz  (105.5 kg)    Examination:  General exam: Appears calm and comfortable  Eyes: Disconjugate gaze, right pupil is larger, less reactive to light, ? CN3 palsy Respiratory system: Clear to auscultation. Respiratory effort normal. Cardiovascular system: S1 & S2 heard, RRR.  Gastrointestinal system: Abdomen is nondistended, soft and nontender.  Normal bowel sounds heard. Central nervous system: Alert and oriented. No focal neurological deficits. Skin: No rashes, lesions or ulcers noted on legs Psychiatry: Judgement and insight appear somewhat diminished. Mood & affect appropriate.     Data Reviewed: I have personally reviewed following labs and imaging studies  CBC:  Recent Labs Lab 06/10/15 0224 06/10/15 0300 06/11/15 0637  WBC  --  7.9 7.3  NEUTROABS  --  5.6  --   HGB 13.9 13.0 12.7  HCT 41.0 39.8 38.8  MCV  --  90.9 90.0  PLT  --  343 343   Basic Metabolic Panel:  Recent Labs Lab 06/10/15 0224 06/10/15 0300 06/11/15 0637  NA 151* 148* 141  K 3.9 3.7 3.4*  CL 110 112* 106  CO2  --  25 24  GLUCOSE 111* 108* 94  BUN 10 8 5*  CREATININE 0.50 0.61 0.66  CALCIUM  --  9.2 9.4   GFR: Estimated Creatinine Clearance: 101.8 mL/min (by C-G  formula based on Cr of 0.66). Liver Function Tests:  Recent Labs Lab 06/10/15 0300  AST 32  ALT 38  ALKPHOS 72  BILITOT 0.2*  PROT 7.7  ALBUMIN 4.0   Cardiac Enzymes:  Recent Labs Lab 06/10/15 0300  CKTOTAL 250*   CBG:  Recent Labs Lab 06/10/15 0202  GLUCAP 111*   Urine analysis:    Component Value Date/Time   COLORURINE YELLOW 06/10/2015 0253   COLORURINE Yellow 04/25/2011 1804   APPEARANCEUR CLOUDY* 06/10/2015 0253   APPEARANCEUR Clear 04/25/2011 1804   LABSPEC 1.021 06/10/2015 0253   LABSPEC 1.024 04/25/2011 1804   PHURINE 7.0 06/10/2015 0253   PHURINE 6.0 04/25/2011 1804   GLUCOSEU NEGATIVE 06/10/2015 0253   GLUCOSEU Negative 04/25/2011 1804   HGBUR NEGATIVE 06/10/2015 0253   HGBUR Negative 04/25/2011  1804   BILIRUBINUR NEGATIVE 06/10/2015 0253   BILIRUBINUR Negative 04/25/2011 1804   KETONESUR NEGATIVE 06/10/2015 0253   KETONESUR Negative 04/25/2011 1804   PROTEINUR NEGATIVE 06/10/2015 0253   PROTEINUR Negative 04/25/2011 1804   UROBILINOGEN 0.2 12/23/2014 1048   NITRITE NEGATIVE 06/10/2015 0253   NITRITE Negative 04/25/2011 1804   LEUKOCYTESUR NEGATIVE 06/10/2015 0253   LEUKOCYTESUR Negative 04/25/2011 1804       Radiology Studies: No results found.      Scheduled Meds: . B-complex with vitamin C  1 tablet Oral Daily  . clonazePAM  1 mg Oral TID  . DULoxetine  60 mg Oral Daily  . enoxaparin (LOVENOX) injection  40 mg Subcutaneous Q24H  . folic acid  1 mg Oral Daily  . gabapentin  800 mg Oral TID  . hydrochlorothiazide  25 mg Oral Daily  . levothyroxine  100 mcg Oral QAC breakfast  . lisinopril  10 mg Oral Daily  . pantoprazole  40 mg Oral Daily  . sodium chloride flush  3 mL Intravenous Q12H   Continuous Infusions: . dextrose 5 % and 0.45% NaCl 100 mL/hr at 06/10/15 1633     LOS: 2 days    Time spent: 25    Debe Coder, MD [facility] Pager [phone]  If 7PM-7AM, please contact night-coverage [email or web address] Password [facility] 06/13/2015, 9:27 AM

## 2015-06-14 ENCOUNTER — Ambulatory Visit: Payer: Medicaid Other | Admitting: Neurology

## 2015-06-14 ENCOUNTER — Inpatient Hospital Stay
Admission: EM | Admit: 2015-06-14 | Discharge: 2015-06-15 | DRG: 885 | Disposition: A | Discharge status: Home / Self Care | Payer: Medicaid Other | Source: Intra-hospital | Attending: Psychiatry | Admitting: Psychiatry

## 2015-06-14 DIAGNOSIS — R011 Cardiac murmur, unspecified: Secondary | ICD-10-CM | POA: Diagnosis present

## 2015-06-14 DIAGNOSIS — M545 Low back pain: Secondary | ICD-10-CM | POA: Diagnosis present

## 2015-06-14 DIAGNOSIS — Z888 Allergy status to other drugs, medicaments and biological substances status: Secondary | ICD-10-CM

## 2015-06-14 DIAGNOSIS — T50902D Poisoning by unspecified drugs, medicaments and biological substances, intentional self-harm, subsequent encounter: Secondary | ICD-10-CM

## 2015-06-14 DIAGNOSIS — F3163 Bipolar disorder, current episode mixed, severe, without psychotic features: Secondary | ICD-10-CM

## 2015-06-14 DIAGNOSIS — Z8619 Personal history of other infectious and parasitic diseases: Secondary | ICD-10-CM

## 2015-06-14 DIAGNOSIS — E039 Hypothyroidism, unspecified: Secondary | ICD-10-CM | POA: Diagnosis present

## 2015-06-14 DIAGNOSIS — Z91410 Personal history of adult physical and sexual abuse: Secondary | ICD-10-CM

## 2015-06-14 DIAGNOSIS — F3160 Bipolar disorder, current episode mixed, unspecified: Secondary | ICD-10-CM | POA: Diagnosis not present

## 2015-06-14 DIAGNOSIS — Z9889 Other specified postprocedural states: Secondary | ICD-10-CM | POA: Diagnosis not present

## 2015-06-14 DIAGNOSIS — Z6841 Body Mass Index (BMI) 40.0 and over, adult: Secondary | ICD-10-CM

## 2015-06-14 DIAGNOSIS — Z87891 Personal history of nicotine dependence: Secondary | ICD-10-CM

## 2015-06-14 DIAGNOSIS — G8929 Other chronic pain: Secondary | ICD-10-CM | POA: Diagnosis present

## 2015-06-14 DIAGNOSIS — R45851 Suicidal ideations: Secondary | ICD-10-CM | POA: Diagnosis present

## 2015-06-14 DIAGNOSIS — Z90710 Acquired absence of both cervix and uterus: Secondary | ICD-10-CM | POA: Diagnosis not present

## 2015-06-14 DIAGNOSIS — Z915 Personal history of self-harm: Secondary | ICD-10-CM | POA: Diagnosis not present

## 2015-06-14 DIAGNOSIS — K219 Gastro-esophageal reflux disease without esophagitis: Secondary | ICD-10-CM | POA: Diagnosis present

## 2015-06-14 DIAGNOSIS — I10 Essential (primary) hypertension: Secondary | ICD-10-CM | POA: Diagnosis present

## 2015-06-14 DIAGNOSIS — G500 Trigeminal neuralgia: Secondary | ICD-10-CM | POA: Diagnosis present

## 2015-06-14 DIAGNOSIS — E870 Hyperosmolality and hypernatremia: Secondary | ICD-10-CM

## 2015-06-14 DIAGNOSIS — T50901A Poisoning by unspecified drugs, medicaments and biological substances, accidental (unintentional), initial encounter: Secondary | ICD-10-CM | POA: Diagnosis present

## 2015-06-14 MED ORDER — DULOXETINE HCL 30 MG PO CPEP
60.0000 mg | ORAL_CAPSULE | Freq: Every day | ORAL | Status: DC
  Administered 2015-06-15: 60 mg via ORAL
  Filled 2015-06-14: qty 2

## 2015-06-14 MED ORDER — RENA-VITE PO TABS
1.0000 | ORAL_TABLET | Freq: Every day | ORAL | Status: DC
  Administered 2015-06-15: 1 via ORAL
  Filled 2015-06-14: qty 1

## 2015-06-14 MED ORDER — HYDROXYZINE HCL 50 MG PO TABS
50.0000 mg | ORAL_TABLET | Freq: Three times a day (TID) | ORAL | Status: DC | PRN
  Administered 2015-06-15: 50 mg via ORAL
  Filled 2015-06-14 (×2): qty 1

## 2015-06-14 MED ORDER — ACETAMINOPHEN 325 MG PO TABS: 650.0000 mg | ORAL_TABLET | Freq: Four times a day (QID) | ORAL | Status: DC | PRN

## 2015-06-14 MED ORDER — OXYCODONE-ACETAMINOPHEN 5-325 MG PO TABS: 1.0000 | ORAL_TABLET | Freq: Four times a day (QID) | ORAL | Status: DC | PRN

## 2015-06-14 MED ORDER — CLONAZEPAM 0.5 MG PO TABS
0.5000 mg | ORAL_TABLET | Freq: Three times a day (TID) | ORAL | Status: DC
  Administered 2015-06-14 – 2015-06-15 (×3): 0.5 mg via ORAL
  Filled 2015-06-14 (×3): qty 1

## 2015-06-14 MED ORDER — MAGNESIUM HYDROXIDE 400 MG/5ML PO SUSP: 30.0000 mL | Freq: Every day | ORAL | Status: DC | PRN

## 2015-06-14 MED ORDER — HYDROCHLOROTHIAZIDE 25 MG PO TABS
25.0000 mg | ORAL_TABLET | Freq: Every day | ORAL | Status: DC
  Administered 2015-06-15: 25 mg via ORAL
  Filled 2015-06-14: qty 1

## 2015-06-14 MED ORDER — GABAPENTIN 400 MG PO CAPS
800.0000 mg | ORAL_CAPSULE | Freq: Three times a day (TID) | ORAL | Status: DC
  Administered 2015-06-14 – 2015-06-15 (×3): 800 mg via ORAL
  Filled 2015-06-14 (×6): qty 2

## 2015-06-14 MED ORDER — ALBUTEROL SULFATE (2.5 MG/3ML) 0.083% IN NEBU: 2.5000 mg | INHALATION_SOLUTION | RESPIRATORY_TRACT | Status: DC | PRN

## 2015-06-14 MED ORDER — BUTALBITAL-APAP-CAFFEINE 50-325-40 MG PO TABS
1.0000 | ORAL_TABLET | Freq: Two times a day (BID) | ORAL | Status: DC | PRN
  Administered 2015-06-14 – 2015-06-15 (×2): 1 via ORAL
  Filled 2015-06-14 (×3): qty 1

## 2015-06-14 MED ORDER — ALUM & MAG HYDROXIDE-SIMETH 200-200-20 MG/5ML PO SUSP: 30.0000 mL | ORAL | Status: DC | PRN

## 2015-06-14 MED ORDER — QUETIAPINE FUMARATE 25 MG PO TABS
25.0000 mg | ORAL_TABLET | Freq: Three times a day (TID) | ORAL | Status: DC | PRN
  Administered 2015-06-14 – 2015-06-15 (×2): 25 mg via ORAL
  Filled 2015-06-14 (×2): qty 1

## 2015-06-14 MED ORDER — LEVOTHYROXINE SODIUM 75 MCG PO TABS
100.0000 ug | ORAL_TABLET | Freq: Every day | ORAL | Status: DC
  Administered 2015-06-15: 100 ug via ORAL
  Filled 2015-06-14: qty 1

## 2015-06-14 MED ORDER — FOLIC ACID 1 MG PO TABS
1.0000 mg | ORAL_TABLET | Freq: Every day | ORAL | Status: DC
  Administered 2015-06-15: 1 mg via ORAL
  Filled 2015-06-14: qty 1

## 2015-06-14 MED ORDER — LISINOPRIL 10 MG PO TABS
10.0000 mg | ORAL_TABLET | Freq: Every day | ORAL | Status: DC
  Administered 2015-06-15: 10 mg via ORAL
  Filled 2015-06-14: qty 1

## 2015-06-14 MED ORDER — PANTOPRAZOLE SODIUM 40 MG PO TBEC
40.0000 mg | DELAYED_RELEASE_TABLET | Freq: Every day | ORAL | Status: DC
  Administered 2015-06-15: 40 mg via ORAL
  Filled 2015-06-14: qty 1

## 2015-06-14 MED ORDER — GABAPENTIN 400 MG PO CAPS: 800.0000 mg | ORAL_CAPSULE | Freq: Three times a day (TID) | ORAL | Status: DC

## 2015-06-14 NOTE — Progress Notes (Signed)
PROGRESS NOTE                                                                                                                                                                                                             Patient Demographics:    Brittany [name], is a 50 y.o. female, DOB - [birth year], ZOX:[id number]  Admit date - 06/10/2015   Admitting Physician Clydie Braun, MD  Outpatient Primary MD for the patient is Evelene [name], MD  LOS - 3  Outpatient Specialists: Psychiatry  Chief Complaint  Patient presents with  . Drug Overdose       Brief Narrative   83 female with bipolar disorder, anxiety and depression with history of multiple drug overdoses presented on 4/27 with baclofen overdose    Subjective:   No overnight issues   Assessment  & Plan :    Principal Problem:   Drug overdose Stable. Sitter bedside. Awaiting inpatient psych bed as per recommendations.  Active Problems:    Bipolar disorder, curr episode mixed, severe, w/o psychotic features (HCC)/anxiety/depression Continue Klonopin, Seroquel and Cymbalta.   Chronic trigeminal neuralgia Continue gabapentin. On fioricet and Neurontin. Added Percocet on 4/30.   Hypothyroidism Continue Synthroid  Hypertension Continue HCTZ         Code Status : Full code  Family Communication  : None  Disposition Plan  : Inpatient psych  Barriers For Discharge :  Awaiting inpatient psych  Consults  :  Psychiatry  Procedures  :None  DVT Prophylaxis  :  Lovenox -  Lab Results  Component Value Date   PLT 343 06/11/2015    Antibiotics  :  None  Anti-infectives    None        Objective:   Filed Vitals:   06/13/15 0553 06/13/15 1522 06/13/15 2200 06/14/15 0616  BP: 127/83 119/62 114/53 95/59  Pulse: 86 91 83 76  Temp: 97.4 F (36.3 C) 97.9 F (36.6 C)  97.6 F (36.4 C)  TempSrc: Oral Oral    Resp: 18 16 16 20     Height:      Weight:      SpO2: 96% 97% 95% 97%    Wt Readings from Last 3 Encounters:  06/11/15 105.5 kg (232 lb 9.4 oz)  05/26/15 99.791 kg (220 lb)  04/27/15 110.678 kg (244 lb)  Intake/Output Summary (Last 24 hours) at 06/14/15 1451 Last data filed at 06/13/15 1837  Gross per 24 hour  Intake    160 ml  Output      0 ml  Net    160 ml     Physical Exam  Gen: not in distress HEENT:, moist mucosa, supple neck Chest: clear b/l, no added sounds CVS: N S1&S2, no murmurs, rubs or gallop GI: soft, NT, ND,  Musculoskeletal: warm, no edema     Data Review:    CBC  Recent Labs Lab 06/10/15 0224 06/10/15 0300 06/11/15 0637  WBC  --  7.9 7.3  HGB 13.9 13.0 12.7  HCT 41.0 39.8 38.8  PLT  --  343 343  MCV  --  90.9 90.0  MCH  --  29.7 29.5  MCHC  --  32.7 32.7  RDW  --  13.9 13.9  LYMPHSABS  --  1.6  --   MONOABS  --  0.6  --   EOSABS  --  0.1  --   BASOSABS  --  0.0  --     Chemistries   Recent Labs Lab 06/10/15 0224 06/10/15 0300 06/11/15 0637  NA 151* 148* 141  K 3.9 3.7 3.4*  CL 110 112* 106  CO2  --  25 24  GLUCOSE 111* 108* 94  BUN 10 8 5*  CREATININE 0.50 0.61 0.66  CALCIUM  --  9.2 9.4  AST  --  32  --   ALT  --  38  --   ALKPHOS  --  72  --   BILITOT  --  0.2*  --    ------------------------------------------------------------------------------------------------------------------ No results for input(s): CHOL, HDL, LDLCALC, TRIG, CHOLHDL, LDLDIRECT in the last 72 hours.  Lab Results  Component Value Date   HGBA1C 5.1 05/01/2015   ------------------------------------------------------------------------------------------------------------------ No results for input(s): TSH, T4TOTAL, T3FREE, THYROIDAB in the last 72 hours.  Invalid input(s): FREET3 ------------------------------------------------------------------------------------------------------------------ No results for input(s): VITAMINB12, FOLATE, FERRITIN, TIBC, IRON,  RETICCTPCT in the last 72 hours.  Coagulation profile No results for input(s): INR, PROTIME in the last 168 hours.  No results for input(s): DDIMER in the last 72 hours.  Cardiac Enzymes No results for input(s): CKMB, TROPONINI, MYOGLOBIN in the last 168 hours.  Invalid input(s): CK ------------------------------------------------------------------------------------------------------------------ No results found for: BNP  Inpatient Medications  Scheduled Meds: . B-complex with vitamin C  1 tablet Oral Daily  . clonazePAM  1 mg Oral TID  . DULoxetine  60 mg Oral Daily  . enoxaparin (LOVENOX) injection  40 mg Subcutaneous Q24H  . folic acid  1 mg Oral Daily  . gabapentin  800 mg Oral TID  . hydrochlorothiazide  25 mg Oral Daily  . levothyroxine  100 mcg Oral QAC breakfast  . lisinopril  10 mg Oral Daily  . pantoprazole  40 mg Oral Daily  . sodium chloride flush  3 mL Intravenous Q12H   Continuous Infusions: . dextrose 5 % and 0.45% NaCl 100 mL/hr at 06/10/15 1633   PRN Meds:.acetaminophen, albuterol, butalbital-acetaminophen-caffeine, hydrALAZINE, LORazepam, ondansetron **OR** ondansetron (ZOFRAN) IV, oxyCODONE-acetaminophen, QUEtiapine  Micro Results No results found for this or any previous visit (from the past 240 hour(s)).  Radiology Reports Ct Head Wo Contrast  06/10/2015  CLINICAL DATA:  Found down.  Unresponsive. EXAM: CT HEAD WITHOUT CONTRAST CT CERVICAL SPINE WITHOUT CONTRAST TECHNIQUE: Multidetector CT imaging of the head and cervical spine was performed following the standard protocol without intravenous contrast. Multiplanar CT image  reconstructions of the cervical spine were also generated. COMPARISON:  03/25/2015 head CT FINDINGS: CT HEAD FINDINGS Skull and Sinuses:No acute fracture or destructive process. Remote right orbital floor blow-out fracture continuing along the anterior maxilla. Right nasal trumpet is located. Visualized orbits: Negative. Brain: Normal. No  evidence of acute infarction, hemorrhage, hydrocephalus, or mass lesion/mass effect. CT CERVICAL SPINE FINDINGS Motion artifact at the level of C7 and the thoracic spine. Cervical spine imaging is overall diagnostic. No evidence of acute fracture or traumatic malalignment. No gross canal hematoma or prevertebral edema. Atelectasis seen in the left upper lobe. IMPRESSION: No evidence of acute intracranial or cervical spine injury. Electronically Signed   By: Marnee Spring M.D.   On: 06/10/2015 03:52   Ct Cervical Spine Wo Contrast  06/10/2015  CLINICAL DATA:  Found down.  Unresponsive. EXAM: CT HEAD WITHOUT CONTRAST CT CERVICAL SPINE WITHOUT CONTRAST TECHNIQUE: Multidetector CT imaging of the head and cervical spine was performed following the standard protocol without intravenous contrast. Multiplanar CT image reconstructions of the cervical spine were also generated. COMPARISON:  03/25/2015 head CT FINDINGS: CT HEAD FINDINGS Skull and Sinuses:No acute fracture or destructive process. Remote right orbital floor blow-out fracture continuing along the anterior maxilla. Right nasal trumpet is located. Visualized orbits: Negative. Brain: Normal. No evidence of acute infarction, hemorrhage, hydrocephalus, or mass lesion/mass effect. CT CERVICAL SPINE FINDINGS Motion artifact at the level of C7 and the thoracic spine. Cervical spine imaging is overall diagnostic. No evidence of acute fracture or traumatic malalignment. No gross canal hematoma or prevertebral edema. Atelectasis seen in the left upper lobe. IMPRESSION: No evidence of acute intracranial or cervical spine injury. Electronically Signed   By: Marnee Spring M.D.   On: 06/10/2015 03:52   Dg Chest Portable 1 View  06/10/2015  CLINICAL DATA:  Patient found down. Unresponsive. Initial encounter. EXAM: PORTABLE CHEST 1 VIEW COMPARISON:  Chest radiograph from 04/22/2015 FINDINGS: The lungs are well-aerated. Mild vascular congestion is noted. Mild bibasilar  atelectasis is seen. There is no evidence of pleural effusion or pneumothorax. The cardiomediastinal silhouette is borderline enlarged. No acute osseous abnormalities are seen. IMPRESSION: Mild vascular congestion and borderline cardiomegaly noted. Mild bibasilar atelectasis seen. Electronically Signed   By: Roanna Raider M.D.   On: 06/10/2015 02:37    Time Spent in minutes  20   Eddie North M.D on 06/14/2015 at 2:51 PM  Between 7am to 7pm - Pager - [phone]  After 7pm go to [email or web address] - password [facility]  [facility] -  Office  [phone]

## 2015-06-14 NOTE — Progress Notes (Signed)
50 year old female received on the unit for suicide attempt.  Affect flat.  Smells of urine.  Verbalized that she is here because she unintentionally took to many baclofen.  States "I took it to stop the pain not to kill myself"   Body search and skin assessment performed.  Multiple old cuts noted bilaterally to lower arms.  Small area of cellulitis noted on the top of right breast. No contraband found.

## 2015-06-14 NOTE — Clinical Social Work Psych Note (Signed)
Patient is currently being reviewed by both [facility] and [facility]. Facilities unsure of bed availability at this time.  Gina Yalonda Sample, LCSW [phone] 2H 1-14; Hospital Psychiatric Service Line Licensed Clinical Social Worker  

## 2015-06-14 NOTE — BH Assessment (Signed)
Patient has been [facility].  Accepting physician is Dr. Ardyth HarpsHernandez.  Attending Physician will be Dr. Jennet MaduroPucilowska.  Patient [facility] room 310[facility] Charge Nurse [place].  Call report to [phone].  Representative/Transfer Coordinator is Therapist, occupationalCalvin  [address] Staff Osborne Casco(Nadia, Social Work) made aware of acceptance.

## 2015-06-14 NOTE — Progress Notes (Signed)
CSW received call from [facility]. They are able to accept patient today. CSW faxed over signed voluntary commitment form and set up [facility] transport. Prescription on chart. Nurse and patient aware of discharge.  CSW signing off.  Osborne Cascoadia Geselle Cardosa LCSWA [phone]

## 2015-06-14 NOTE — Tx Team (Signed)
Initial Interdisciplinary Treatment Plan   PATIENT STRESSORS: Health problems   PATIENT STRENGTHS: Ability for insight Communication skills General fund of knowledge Motivation for treatment/growth Supportive family/friends   PROBLEM LIST: Problem List/Patient Goals Date to be addressed Date deferred Reason deferred Estimated date of resolution  Depression 06/14/15     Suicidal Ideation 06/14/15     Anxiety 06/14/15                                          DISCHARGE CRITERIA:  Improved stabilization in mood, thinking, and/or behavior  PRELIMINARY DISCHARGE PLAN: Outpatient therapy  PATIENT/FAMIILY INVOLVEMENT: This treatment plan has been presented to and reviewed with the patient, Brittany [name], and/or family member.  The patient and family have been given the opportunity to ask questions and make suggestions.  Brittany [name], Brittany [name] [facility] 06/14/2015, 10:37 PM

## 2015-06-14 NOTE — Progress Notes (Signed)
[facility]. Report called to staff. Voluntary admission paperwork and perscriptions  [facility].

## 2015-06-14 NOTE — Discharge Summary (Signed)
Physician Discharge Summary  Brittany [name] ZOX:[id number] DOB: 01-04-1966 DOA: 06/10/2015  PCP: Evelene [name], MD  Admit date: 06/10/2015 Discharge date: 06/14/2015  Time spent: 30 minutes  Recommendations for Outpatient Follow-up:  1. Discharged to inpatient psych in [place]   Discharge Diagnoses:  Principal Problem:   Drug overdose  Active Problems:   Bipolar disorder, curr episode mixed, severe, w/o psychotic features (HCC)   Sinus bradycardia   Hypernatremia   Essential hypertension   Trigeminal neuralgia   Discharge Condition: Fair  Diet recommendation: Regular    Filed Weights   06/11/15 0900  Weight: 105.5 kg (232 lb 9.4 oz)    History of present illness:  Please refer to admission H&P for details, in brief, 50 year old female with history of bipolar disorder with recurrent overdose, hypertension, hypothyroidism and chronic low back pain presented with drug overdose with baclofen. She was given a new pressure baclofen on 05/29/2015 and appears to have ingested over 5gm of baclofen with 36 hours. Patient was found by her husband undergone forming at the mouth and called 911. Patient was obtunded and unable to provide any history on admission. In the ED your induction was positive for benzodiazepine. Vitals were otherwise stable. Poison control contacted and recommended close monitoring. Head CT and CT of the cervical spine showed no acute abnormality. She was initially placed on nonrebreather and admitted to hospitalist service   Hospital Course:  Principal Problem:  Drug overdose Stable. Placed on sepsis sitter. Seen by psychiatry and recommended inpatient psych. Baclofen discontinued. Resumed her other home medications.  Patient remains hemodynamically stable. Has been accepted at inpatient psychiatry in [place].  Active Problems:   Bipolar disorder, curr episode mixed, severe, w/o psychotic features (HCC)/anxiety/depression Continue Klonopin,  Seroquel and Cymbalta.   Chronic trigeminal neuralgia Continue gabapentin. On fioricet . Added low dose Percocet on 4/30 with improvement. Please continue for a short course only.   Hypothyroidism Continue Synthroid  Hypertension Stable. Continue HCTZ  Code Status : Full code  Family Communication : None  Disposition Plan : Inpatient psych    Consults : Psychiatry  Procedures : Head and cervical spine CT  DVT Prophylaxis : Lovenox -   Recent Labs    Lab Results  Component Value Date   PLT 343 06/11/2015      Antibiotics : None  Anti-infectives    None          Discharge Exam: Filed Vitals:   06/14/15 0616 06/14/15 1607  BP: 95/59 128/65  Pulse: 76 92  Temp: 97.6 F (36.4 C) 97.9 F (36.6 C)  Resp: 20 17    Gen: not in distress HEENT: No pallor,, moist mucosa, supple neck Chest: clear b/l, no added sounds CVS: N S1&S2, no murmurs, rubs or gallop GI: soft, NT, ND,  Musculoskeletal: warm, no edema CNS: Alert and oriented  Discharge Instructions    Current Discharge Medication List    START taking these medications   Details  oxyCODONE-acetaminophen (PERCOCET) 5-325 MG tablet Take 1 tablet by mouth every 6 (six) hours as needed for severe pain. Qty: 15 tablet, Refills: 0      CONTINUE these medications which have CHANGED   Details  gabapentin (NEURONTIN) 400 MG capsule Take 2 capsules (800 mg total) by mouth 3 (three) times daily. Qty: 30 capsule, Refills: 0      CONTINUE these medications which have NOT CHANGED   Details  b complex vitamins tablet Take 1 tablet by mouth daily.    Cholecalciferol (VITAMIN D  PO) Take 1 tablet by mouth daily. Reported on 05/26/2015    clonazePAM (KLONOPIN) 1 MG tablet Takes 1 tablet by mouth four times a day Refills: 0    DULoxetine (CYMBALTA) 60 MG capsule Take 1 capsule (60 mg total) by mouth daily. Qty: 30 capsule, Refills: 0    levothyroxine (SYNTHROID, LEVOTHROID) 100  MCG tablet Take 100 mcg by mouth daily before breakfast.    lisinopril (PRINIVIL,ZESTRIL) 10 MG tablet Take 10 mg by mouth daily.    Multiple Vitamin (MULTIVITAMIN WITH MINERALS) TABS tablet Take 1 tablet by mouth daily.     PROAIR HFA 108 (90 Base) MCG/ACT inhaler Take 2 puffs by mouth every 4 (four) hours as needed. Refills: 5    QUEtiapine (SEROQUEL) 25 MG tablet Take 1 tablet (25 mg total) by mouth 3 (three) times daily as needed (for anxiety). Take 2 tablets (50mg  total) by mouth at bedtime for mood stabilization/insomina Qty: 90 tablet, Refills: 0    QVAR 80 MCG/ACT inhaler Take 2 puffs by mouth 2 (two) times daily. Refills: 5    thiamine 100 MG tablet Take 1 tablet (100 mg total) by mouth daily.    butalbital-acetaminophen-caffeine (FIORICET, ESGIC) 50-325-40 MG tablet Take 1 tablet by mouth every 4 (four) hours as needed for headache or migraine.    Carbamazepine (EQUETRO) 300 MG CP12 Take 300 mg by mouth daily.    hydrochlorothiazide (HYDRODIURIL) 25 MG tablet Take 25 mg by mouth daily.      STOP taking these medications     folic acid (FOLVITE) 1 MG tablet      pantoprazole (PROTONIX) 40 MG tablet        Allergies  Allergen Reactions  . Toradol [Ketorolac Tromethamine] Swelling  . Suboxone [Buprenorphine Hcl-Naloxone Hcl] Other (See Comments)    cellulitis   . Trazodone And Nefazodone Other (See Comments)    Bladder infection   Follow-up Information    Please follow up.   Why:  INPATIENT PSYCH       The results of significant diagnostics from this hospitalization (including imaging, microbiology, ancillary and laboratory) are listed below for reference.    Significant Diagnostic Studies: Ct Head Wo Contrast  06/10/2015  CLINICAL DATA:  Found down.  Unresponsive. EXAM: CT HEAD WITHOUT CONTRAST CT CERVICAL SPINE WITHOUT CONTRAST TECHNIQUE: Multidetector CT imaging of the head and cervical spine was performed following the standard protocol without  intravenous contrast. Multiplanar CT image reconstructions of the cervical spine were also generated. COMPARISON:  03/25/2015 head CT FINDINGS: CT HEAD FINDINGS Skull and Sinuses:No acute fracture or destructive process. Remote right orbital floor blow-out fracture continuing along the anterior maxilla. Right nasal trumpet is located. Visualized orbits: Negative. Brain: Normal. No evidence of acute infarction, hemorrhage, hydrocephalus, or mass lesion/mass effect. CT CERVICAL SPINE FINDINGS Motion artifact at the level of C7 and the thoracic spine. Cervical spine imaging is overall diagnostic. No evidence of acute fracture or traumatic malalignment. No gross canal hematoma or prevertebral edema. Atelectasis seen in the left upper lobe. IMPRESSION: No evidence of acute intracranial or cervical spine injury. Electronically Signed   By: Marnee SpringJonathon  Watts M.D.   On: 06/10/2015 03:52   Ct Cervical Spine Wo Contrast  06/10/2015  CLINICAL DATA:  Found down.  Unresponsive. EXAM: CT HEAD WITHOUT CONTRAST CT CERVICAL SPINE WITHOUT CONTRAST TECHNIQUE: Multidetector CT imaging of the head and cervical spine was performed following the standard protocol without intravenous contrast. Multiplanar CT image reconstructions of the cervical spine were also generated. COMPARISON:  03/25/2015 head CT FINDINGS: CT HEAD FINDINGS Skull and Sinuses:No acute fracture or destructive process. Remote right orbital floor blow-out fracture continuing along the anterior maxilla. Right nasal trumpet is located. Visualized orbits: Negative. Brain: Normal. No evidence of acute infarction, hemorrhage, hydrocephalus, or mass lesion/mass effect. CT CERVICAL SPINE FINDINGS Motion artifact at the level of C7 and the thoracic spine. Cervical spine imaging is overall diagnostic. No evidence of acute fracture or traumatic malalignment. No gross canal hematoma or prevertebral edema. Atelectasis seen in the left upper lobe. IMPRESSION: No evidence of acute  intracranial or cervical spine injury. Electronically Signed   By: Marnee Spring M.D.   On: 06/10/2015 03:52   Dg Chest Portable 1 View  06/10/2015  CLINICAL DATA:  Patient found down. Unresponsive. Initial encounter. EXAM: PORTABLE CHEST 1 VIEW COMPARISON:  Chest radiograph from 04/22/2015 FINDINGS: The lungs are well-aerated. Mild vascular congestion is noted. Mild bibasilar atelectasis is seen. There is no evidence of pleural effusion or pneumothorax. The cardiomediastinal silhouette is borderline enlarged. No acute osseous abnormalities are seen. IMPRESSION: Mild vascular congestion and borderline cardiomegaly noted. Mild bibasilar atelectasis seen. Electronically Signed   By: Roanna Raider M.D.   On: 06/10/2015 02:37    Microbiology: No results found for this or any previous visit (from the past 240 hour(s)).   Labs: Basic Metabolic Panel:  Recent Labs Lab 06/10/15 0224 06/10/15 0300 06/11/15 0637  NA 151* 148* 141  K 3.9 3.7 3.4*  CL 110 112* 106  CO2  --  25 24  GLUCOSE 111* 108* 94  BUN 10 8 5*  CREATININE 0.50 0.61 0.66  CALCIUM  --  9.2 9.4   Liver Function Tests:  Recent Labs Lab 06/10/15 0300  AST 32  ALT 38  ALKPHOS 72  BILITOT 0.2*  PROT 7.7  ALBUMIN 4.0   No results for input(s): LIPASE, AMYLASE in the last 168 hours. No results for input(s): AMMONIA in the last 168 hours. CBC:  Recent Labs Lab 06/10/15 0224 06/10/15 0300 06/11/15 0637  WBC  --  7.9 7.3  NEUTROABS  --  5.6  --   HGB 13.9 13.0 12.7  HCT 41.0 39.8 38.8  MCV  --  90.9 90.0  PLT  --  343 343   Cardiac Enzymes:  Recent Labs Lab 06/10/15 0300  CKTOTAL 250*   BNP: BNP (last 3 results) No results for input(s): BNP in the last 8760 hours.  ProBNP (last 3 results) No results for input(s): PROBNP in the last 8760 hours.  CBG:  Recent Labs Lab 06/10/15 0202  GLUCAP 111*       Signed:  Eddie North MD.  [facility] 06/14/2015, 5:09 PM

## 2015-06-15 DIAGNOSIS — F3160 Bipolar disorder, current episode mixed, unspecified: Principal | ICD-10-CM

## 2015-06-15 MED ORDER — METHOCARBAMOL 500 MG PO TABS: 500.0000 mg | ORAL_TABLET | Freq: Three times a day (TID) | ORAL | Status: DC | PRN

## 2015-06-15 MED ORDER — HYDROXYZINE HCL 50 MG PO TABS: 50.0000 mg | ORAL_TABLET | Freq: Three times a day (TID) | ORAL | Status: DC | PRN

## 2015-06-15 MED ORDER — CARBAMAZEPINE 200 MG PO TABS: 200.0000 mg | ORAL_TABLET | Freq: Two times a day (BID) | ORAL | Status: DC

## 2015-06-15 MED ORDER — METHOCARBAMOL 750 MG PO TABS
750.0000 mg | ORAL_TABLET | Freq: Once | ORAL | Status: AC
  Administered 2015-06-15: 750 mg via ORAL
  Filled 2015-06-15: qty 1

## 2015-06-15 MED ORDER — CARBAMAZEPINE ER 300 MG PO CP12: 300.0000 mg | ORAL_CAPSULE | Freq: Every day | ORAL | Status: DC

## 2015-06-15 NOTE — Progress Notes (Signed)
Patient denies SI/HI, denies A/V hallucinations. Patient verbalizes understanding of discharge instructions, follow up care and prescriptions. Patient given all belongings from  locker. Patient escorted out by staff, transported by security to bus stop. 
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
## 2015-06-15 NOTE — Progress Notes (Signed)
D: Patient denies SI/HI/AVH.  Patient affect and mood are anxious.  Patient did attend evening group. Patient visible on the milieu. No distress noted. A: Support and encouragement offered. Scheduled medications given to pt. Q 15 min checks continued for patient safety. R: Patient receptive. Patient remains safe on the unit.   
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
## 2015-06-15 NOTE — Progress Notes (Signed)
Recreation Therapy Notes  Date: 05.02.17 Time: 3:00 pm Location: Craft Room  Group Topic: Decision Making  Goal Area(s) Addresses:  Patient will be able to identify a good decision they have made. Patient will be able to identify a bad decision they have made.  Behavioral Response: Attentive, Interactive  Intervention: Mind Mapping  Activity: Patients were given a mind map and instructed to write 4 bad decisions they have made and the consequences of those decisions as well as 4 good decisions they have made and the outcome of those decisions.  Education: LRT educated patients on why it is important make good decisions.  Education Outcome: Acknowledges education/In group clarification offered   Clinical Observations/Feedback: Patient completed activity by writing 4 good decisions, 4 bad decisions, and the outcomes of those decisions. Patient contributed to group discussion by stating how making good decisions can positive affect her life, that she can use this activity to help her make better decisions in the future, and one thing she has learned about herself today.  Brittany [name],Brittany [name] M, LRT/CTRS 06/15/2015 4:19 PM
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
## 2015-06-15 NOTE — BHH Suicide Risk Assessment (Signed)
[facility]   Nursing information obtained from:  Patient Demographic factors:  Caucasian, Unemployed Current Mental Status:  NA Loss Factors:  Decline in physical health Historical Factors:  Prior suicide attempts Risk Reduction Factors:  Living with another person, especially a relative  Total Time spent with patient: 1 hour Principal Problem: Bipolar I disorder, most recent episode mixed (HCC) Diagnosis:   Patient Active Problem List   Diagnosis Date Noted  . Bipolar I disorder, most recent episode mixed (HCC) [F31.60] 06/14/2015  . Essential hypertension [I10]   . Sinus bradycardia [R00.1] 06/10/2015  . Prolonged QT interval [I45.81] 05/03/2015  . Morbid obesity due to excess calories (HCC) [E66.01]   . GERD (gastroesophageal reflux disease) [K21.9]   . Migraine without aura and without status migrainosus, not intractable [G43.009]   . Overdose [T50.901A] 03/23/2015  . History of hepatitis C virus infection [Z86.19] 02/26/2015  . 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 Right facial pain [R51] 01/05/2015  . Polypharmacy [Z79.899] 01/18/2012    Class: Chronic  . Borderline personality disorder [F60.3] 01/18/2012  . Hypothyroidism [E03.9] 09/16/2011   Subjective Data: Mood instability, chronic pain, medication overdose.  Continued Clinical Symptoms:  Alcohol Use Disorder Identification Test Final Score (AUDIT): 1 The "Alcohol Use Disorders Identification Test", Guidelines for Use in Primary Care, Second Edition.  World Science writerHealth Organization [facility](WHO). Score between 0-7:  no or low risk or alcohol related problems. Score between 8-15:  moderate risk of alcohol related problems. Score between 16-19:  high risk of alcohol related problems. Score 20 or above:  warrants further diagnostic evaluation for alcohol dependence and treatment.   CLINICAL FACTORS:   Bipolar Disorder:   Mixed State Chronic Pain   Musculoskeletal: Strength & Muscle Tone: within normal  limits Gait & Station: normal Patient leans: N/A  Psychiatric Specialty Exam: Review of Systems  Musculoskeletal: Positive for myalgias.  Neurological: Positive for headaches.  All other systems reviewed and are negative.   Blood pressure 109/83, pulse 83, temperature 98.7 F (37.1 C), temperature source Oral, resp. rate 18, height 5\' 4"  (1.626 m), weight 107.049 kg (236 lb), SpO2 95 %.Body mass index is 40.49 kg/(m^2).  General Appearance: Casual  Eye Contact::  Good  Speech:  Clear and Coherent  Volume:  Normal  Mood:  Anxious  Affect:  Appropriate  Thought Process:  Goal Directed  Orientation:  Full (Time, Place, and Person)  Thought Content:  WDL  Suicidal Thoughts:  No  Homicidal Thoughts:  No  Memory:  Immediate;   Fair Recent;   Fair Remote;   Fair  Judgement:  Impaired  Insight:  Shallow  Psychomotor Activity:  Normal  Concentration:  Fair  Recall:  F[employer]Fair  Fund of Knowledge:Fair  Language: Fair  Akathisia:  No  Handed:  Right  AIMS (if indicated):     Assets:  Communication Skills Desire for Improvement Financial Resources/Insurance Housing Intimacy Resilience Social Support  Sleep:  Number of Hours: 8  Cognition: WNL  ADL's:  Intact    COGNITIVE FEATURES THAT CONTRIBUTE TO RISK:  None    SUICIDE RISK:   Minimal: No identifiable suicidal ideation.  Patients presenting with no risk factors but with morbid ruminations; may be classified as minimal risk based on the severity of the depressive symptoms  PLAN OF CARE: Hospital admission, medication management, discharge planning.  [name] is a 50 year old female with a history of depression, mood instability, multiple suicide attempts who was admitted after overdose on baclofen.  1.  Suicidal ideation. The patient adamantly denies any thoughts, intentions, or plans to hurt herself or others. She is able to contract for safety. She is forward thinking and optimistic about the future.  2. Mood. We will  continue the Cymbalta and Seroquel for depression. We started Tegretol for mood stabilization and neuralgia.  3. Anxiety. The patient is on clonazepam and Vistaril.  4. GERD. She is on Protonix.  5. Hypertension. She is on lithium and hydrochlorothiazide.  6. Hypothyroidism. She is on Synthroid.  7. Metabolic syndrome monitoring. Lipid profile, hemoglobin A1c, TSH and prolactin are pending.  8. Trigeminal neuralgia. The patient reportedly takes Fioricet. She no longer is on Flexeril o or baclofen due to overdose. She is awaiting microvascular decompression of trigeminal nerve on May 9.  9. Disposition. She will be discharged to home with her fianc. She will follow up with [facility].  I certify that inpatient services furnished can reasonably be expected to improve the patient's condition.   Kristine Linea, MD 06/15/2015, 3:30 PM

## 2015-06-15 NOTE — BHH Suicide Risk Assessment (Signed)
BHH INPATIENT:  Family/Significant Other Suicide Prevention Education  Suicide Prevention Education:  Education Completed; Theo DillsJohn Gillikin (fiance) [phone] has been identified by the patient as the family member/significant other with whom the patient will be residing, and identified as the person(s) who will aid the patient in the event of a mental health crisis (suicidal ideations/suicide attempt).  With written consent from the patient, the family member/significant other has been provided the following suicide prevention education, prior to the and/or following the discharge of the patient.  The suicide prevention education provided includes the following:  Suicide risk factors  Suicide prevention and interventions  National Suicide Hotline telephone number  [facility] assessment telephone number  [facility] Emergency Assistance 911  [facility] and/or Residential Mobile Crisis Unit telephone number  Request made of family/significant other to:  Remove weapons (e.g., guns, rifles, knives), all items previously/currently identified as safety concern.    Remove drugs/medications (over-the-counter, prescriptions, illicit drugs), all items previously/currently identified as a safety concern.  The family member/significant other verbalizes understanding of the suicide prevention education information provided.  The family member/significant other agrees to remove the items of safety concern listed above.  Lulu RidingIngle, Irene Mitcham T, MSW, LCSW 06/15/2015, 3:01 PM

## 2015-06-15 NOTE — Progress Notes (Signed)
  [facility] Dis[facility] :  Will you be returning to the same living situation after discharge:  Yes,  home with fiance At discharge, do you have transportation home?: No. [place] bus fare and [place] bus ticket provided. Do you have the ability to pay for your medications: Yes,  patient has Medicaid  Release of information consent forms completed and in the chart;  Patient's signature needed at discharge.  Patient to Follow up at: Follow-up Information    Follow up with [facility]. Go on 06/17/2015.   Why:  For follow-up care appt Thursday 06/17/15 at 8:00am for walkin hospital follow up. (walk in appts are M-F 8-4)   Contact information:   [address] [place], [place] Ph [phone] Fax [phone]      Next level of care provider has access to [facility] Link:no  Safety Planning and Suicide Prevention discussed: Yes,  SPE discussed with patient and Theo DillsJohn Gillikin (fiance) [phone]  Have you used any form of tobacco in the last 30 days? (Cigarettes, Smokeless Tobacco, Cigars, and/or Pipes): No  Has patient been referred to the Quitline?: N/A patient is not a smoker  Patient has been referred for addiction treatment: N/A  Brittany [name], Brittany [name], Brittany [name], Brittany [name] 06/15/2015, 3:03 PM [phone]

## 2015-06-15 NOTE — BHH Group Notes (Signed)
BHH Group Notes:  (Nursing/MHT/Case Management/Adjunct)  Date:  06/15/2015  Time:  2:13 PM  Type of Therapy:  Psychoeducational Skills  Participation Level:  Active  Participation Quality:  Appropriate, Attentive and Supportive  Affect:  Appropriate  Cognitive:  Appropriate  Insight:  Appropriate  Engagement in Group:  Supportive  Modes of Intervention:  Discussion and Education  Summary of Progress/Problems:  Twanna Hymanda C Nikkie Liming 06/15/2015, 2:13 PM

## 2015-06-15 NOTE — Plan of Care (Signed)
Problem: [facility] Participation in Recreation Therapeutic Interventions Goal: STG-Other Recreation Therapy Goal (Specify) STG: Time Management - Within 3 treatment sessions, patient will verbalize understanding of scheduling in one treatment session to increase time management skills post d/c.  Outcome: Completed/Met Date Met:  06/15/15 Treatment Session 1; Completed 1 out of 1: At approximately 3:55 pm, LRT met with patient in craft room. LRT educated and provided patient with blank schedules. Patient verbalized understanding. LRT encouraged patient to use the schedules to help her manage her time.  Leonette Monarch, LRT/CTRS 05.02.17 4:30 pm  Problem: [facility] Participation in Recreation Therapeutic Interventions Goal: STG-Other Recreation Therapy Goal (Specify) STG: Stress Management - Within 3 treatment sessions, patient will verbalize understanding of the stress management techniques in one treatment session to increase stress management skills post d/c.  Outcome: Completed/Met Date Met:  06/15/15 Treatment Session 1; Completed 1 out of 1: At approximately 3:55 pm, LRT met with patient in craft room. LRT educated and provided patient with handouts on stress management techniques. Patient verbalized understanding. LRT encouraged patient to read over and practice the stress management techniques  Leonette Monarch, LRT/CTRS 05.02.17 4:31 pm
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
## 2015-06-16 NOTE — Progress Notes (Signed)
Recreation Therapy Notes  INPATIENT RECREATION TR PLAN  Patient Details Name: Mc Hollen MRN: [id number] DOB: [birth year] Today's Date: 06/16/2015  Rec Therapy Plan Is patient appropriate for Therapeutic Recreation?: Yes Treatment times per week: At least once a week TR Treatment/Interventions: 1:1 session, Group participation (Comment) (Appropriate participation in daily recreational therapy tx)  Discharge Criteria Pt will be discharged from therapy if:: Treatment goals are met, Discharged Treatment plan/goals/alternatives discussed and agreed upon by:: Patient/family  Discharge Summary Short term goals set: See Care Plan Short term goals met: Complete Progress toward goals comments: One-to-one attended Which groups?: Other (Comment) (Decision Making) One-to-one attended: Stress management, time management Reason goals not met: N/A Therapeutic equipment acquired: None Reason patient discharged from therapy: Discharge from hospital Pt/family agrees with progress & goals achieved: Yes Date patient discharged from therapy: 06/15/15   Leonette Monarch, LRT/CTRS 06/16/2015, 10:23 AM

## 2015-06-22 ENCOUNTER — Encounter: Payer: Self-pay | Admitting: Neurology

## 2015-06-22 ENCOUNTER — Ambulatory Visit (INDEPENDENT_AMBULATORY_CARE_PROVIDER_SITE_OTHER): Payer: Medicaid Other | Admitting: Neurology

## 2015-06-22 VITALS — BP 126/84 | HR 71 | Ht 64.0 in | Wt 232.2 lb

## 2015-06-22 DIAGNOSIS — G500 Trigeminal neuralgia: Secondary | ICD-10-CM

## 2015-06-22 NOTE — Progress Notes (Signed)
NEUROLOGY CONSULTATION NOTE  Brittany [name] MRN: [id number] DOB: December 20, 1965  Referring provider: Dr. Brunetta Genera Primary care provider: Dr. Brunetta Genera  Reason for consult:  Trigeminal neuralgia  HISTORY OF PRESENT ILLNESS: Brittany [name] is a 50 year old right-handed woman with Bipolar disorder, hypertension, hypothyroidism, anxiety and multiple suicide attempts who presents for right facial pain.  History obtained by patient, her fiance, hospital notes and prior neurologist's notes.  Labs and imaging of  Brain MRI from 02/27/15 personally reviewed.  In October 2015, she sustained facial injury with bilateral orbital bruising and right orbit and zygomatic fracture.  Since then, she has had constant severe burning pain in the right V2 and V3 distribution.  There is also paroxysmal electric pain in the same distribution, triggered and/or exacerbated with speaking or eating.  She has history of bipolar disorder.  She reportedly has history of suicide attempt.  She was admitted to the psychiatric unit from March 15 to 20 2017 for overdose of multiple medications.  She says she overdosed accidentally in attempt to treat the pain.  As a result, multiple medications were discontinued.  She was recently readmitted again.    Current medication:  gabapentin 829m three times daily, Tegretol 3061m(causing acne, cannot tolerate higher doses), Cymbalta 6041mPercocet.  Past medications with failure or side effects (some used to treat her Bipolar) include:  Lamictal (side effects), Trileptal (side effects), Baclofen (ineffective and stopped due to overdose), nortriptyline (ineffective and stopped due to overdose), Lyrica (weight gain), Flexeril  MRI of brain without contrast from 02/27/15 was unremarkable.  Labs from 06/11/15:  CBC normal with WBC 7.3, Hgb 12.7, HCT 38.8 and PLT 343.  BMP with Na 141, K 3.4, Cl 106, glucose 94, BUN 5 and Cr 0.66.  LFTs with total protein 6.1, Albumin 3.4, Alk Phos 92, AST 26,  ALT 41, and total bili 1.1.  PAST MEDICAL HISTORY: Past Medical History  Diagnosis Date  . Hypertension   . B[place] 1 disorder ([facility] . Anxiety   . Bulging lumbar disc   . Ruptured intervertebral disc   . Head injury   . Short-term memory loss   . Depression   . Hypothyroidism   . Goiter   . Facial pain 11/2013    walking dog when she tripped and "faceplanted" landing on her face.   . Facial nerve injury   . Hyperlipidemia   . Heart murmur   . Chronic bronchitis ([facility] . GERD (gastroesophageal reflux disease)   . Migraine     "weekly" (03/23/2015)  . Seizures ([facility]   "I've had a few; had a grand mal when I took Neurotin; still take Neurotin" (03/23/2015)  . Chronic lower back pain     PAST SURGICAL HISTORY: Past Surgical History  Procedure Laterality Date  . Tonsillectomy    . Abdominal hysterectomy    . Cesarean section  2004  . Eye surgery Right 11/2013    emergent lateral canthotomy due to proptosis  /notes 11/30/2013    MEDICATIONS: Current Outpatient Prescriptions on File Prior to Visit  Medication Sig Dispense Refill  . Carbamazepine (EQUETRO) 300 MG CP12 Take 1 capsule (300 mg total) by mouth daily. 30 each 0  . Cholecalciferol (VITAMIN D PO) Take 1 tablet by mouth daily. Reported on 05/26/2015    . clonazePAM (KLONOPIN) 1 MG tablet Takes 1 tablet by mouth four times a day  0  . DULoxetine (CYMBALTA) 60 MG capsule Take 1 capsule (60 mg  total) by mouth daily. 30 capsule 0  . gabapentin (NEURONTIN) 400 MG capsule Take 2 capsules (800 mg total) by mouth 3 (three) times daily. 30 capsule 0  . hydrochlorothiazide (HYDRODIURIL) 25 MG tablet Take 25 mg by mouth daily.    . hydrOXYzine (ATARAX/VISTARIL) 50 MG tablet Take 1 tablet (50 mg total) by mouth 3 (three) times daily as needed for anxiety (sleep). 90 tablet 0  . levothyroxine (SYNTHROID, LEVOTHROID) 100 MCG tablet Take 100 mcg by mouth daily before breakfast.    . Multiple Vitamin (MULTIVITAMIN WITH MINERALS)  TABS tablet Take 1 tablet by mouth daily.     Marland Kitchen PROAIR HFA 108 (90 Base) MCG/ACT inhaler Take 2 puffs by mouth every 4 (four) hours as needed.  5  . QVAR 80 MCG/ACT inhaler Take 2 puffs by mouth 2 (two) times daily.  5  . thiamine 100 MG tablet Take 1 tablet (100 mg total) by mouth daily.    Marland Kitchen b complex vitamins tablet Take 1 tablet by mouth daily.     No current facility-administered medications on file prior to visit.    ALLERGIES: Allergies  Allergen Reactions  . Toradol [Ketorolac Tromethamine] Swelling  . Suboxone [Buprenorphine Hcl-Naloxone Hcl] Other (See Comments)    cellulitis   . Trazodone And Nefazodone Other (See Comments)    Bladder infection    FAMILY HISTORY: Family History  Problem Relation Age of Onset  . Migraines Mother   . Migraines Father   . Other Father   . Migraines Sister     SOCIAL HISTORY: Social History   Social History  . Marital Status: Married    Spouse Name: N/A  . Number of Children: 1  . Years of Education: 16   Occupational History  . Unemployed    Social History Main Topics  . Smoking status: Former Smoker    Types: Cigarettes  . Smokeless tobacco: Never Used     Comment: "smoked cigarettes when I was 50 years old"  . Alcohol Use: Yes     Comment: 03/23/2015 "might drink at Christmastime"  . Drug Use: No     Comment: 03/23/2015 ""haven't smoked marijuana for years"  . Sexual Activity: Yes    Birth Control/ Protection: Condom   Other Topics Concern  . Not on file   Social History Narrative   Lives at home with her fiancee.   Right-handed.   No caffeine use.    REVIEW OF SYSTEMS: Constitutional: No fevers, chills, or sweats, no generalized fatigue, change in appetite Eyes: No visual changes, double vision, eye pain Ear, nose and throat: No hearing loss, ear pain, nasal congestion, sore throat Cardiovascular: No chest pain, palpitations Respiratory:  No shortness of breath at rest or with exertion,  wheezes GastrointestinaI: No nausea, vomiting, diarrhea, abdominal pain, fecal incontinence Genitourinary:  No dysuria, urinary retention or frequency Musculoskeletal:  No neck pain, back pain Integumentary: No rash, pruritus, skin lesions Neurological: as above Psychiatric: No depression, insomnia, anxiety Endocrine: No palpitations, fatigue, diaphoresis, mood swings, change in appetite, change in weight, increased thirst Hematologic/Lymphatic:  No anemia, purpura, petechiae. Allergic/Immunologic: no itchy/runny eyes, nasal congestion, recent allergic reactions, rashes  PHYSICAL EXAM: Filed Vitals:   06/22/15 0825  BP: 126/84  Pulse: 71   General: In distress.  Patient appears well-groomed.  Head:  Normocephalic/atraumatic Eyes:  fundi examined but not visualized Neck: supple, no paraspinal tenderness, full range of motion Back: No paraspinal tenderness Heart: regular rate and rhythm Lungs: Clear to auscultation bilaterally. Vascular:  No carotid bruits. Neurological Exam: Mental status: alert and oriented to person, place, and time, recent and remote memory intact, fund of knowledge intact, attention and concentration intact, speech fluent and not dysarthric, language intact. Cranial nerves: CN I: not tested CN II: pupils equal, round and reactive to light, visual fields intact CN III, IV, VI:  full range of motion, no nystagmus, no ptosis CN V: facial sensation intact CN VII: upper and lower face symmetric CN VIII: hearing intact CN IX, X: gag intact, uvula midline CN XI: sternocleidomastoid and trapezius muscles intact CN XII: tongue midline Bulk & Tone: normal, no fasciculations. Motor:  5/5 throughout  Sensation: temperature and vibration sensation intact. Deep Tendon Reflexes:  2+ throughout, toes downgoing.  Finger to nose testing:  Without dysmetria.  Heel to shin:  Without dysmetria.  Gait:  Normal station and stride.  Able to turn. Romberg  negative.  IMPRESSION: Right sided trigeminal neuralgia.  She has had side effects or failed multiple antidepressants and anticonvulsants.   PLAN: 1.  Due to multiple medication failure and intolerance, we will refer her for surgical evaluation. 2.  Ultimately, she needs pain management, as I do not prescribe opioids and she has history of overdose. 3.  She will follow up in 3 months to see how she is doing.  Thank you for allowing me to take part in the care of this patient.  Metta Clines, DO  CC: Lorelee Market, MD

## 2015-06-22 NOTE — Progress Notes (Signed)
Chart forwarded.  

## 2015-06-22 NOTE — Patient Instructions (Signed)
1.  We will refer you to a surgeon.  I don't think any medications that I prescribe will be helpful since you have already tried them and failed. 2.  If you are taking the Tegretol solely for the pain, and it is ineffective, then I would stop it. 3.  Follow up in 3 months.

## 2015-06-24 ENCOUNTER — Telehealth: Payer: Self-pay

## 2015-06-24 NOTE — Telephone Encounter (Signed)
[facility] @ 12:00 w/ Dr. Franky Machoabbell.

## 2015-07-05 ENCOUNTER — Other Ambulatory Visit: Payer: Self-pay | Admitting: Neurosurgery

## 2015-07-05 DIAGNOSIS — G500 Trigeminal neuralgia: Secondary | ICD-10-CM

## 2015-07-19 ENCOUNTER — Ambulatory Visit
Admission: RE | Admit: 2015-07-19 | Discharge: 2015-07-19 | Disposition: A | Discharge status: Home / Self Care | Payer: Medicaid Other | Source: Ambulatory Visit | Attending: Neurosurgery | Admitting: Neurosurgery

## 2015-07-19 DIAGNOSIS — G500 Trigeminal neuralgia: Secondary | ICD-10-CM

## 2015-07-19 MED ORDER — GADOBENATE DIMEGLUMINE 529 MG/ML IV SOLN
20.0000 mL | Freq: Once | INTRAVENOUS | Status: AC | PRN
  Administered 2015-07-19: 20 mL via INTRAVENOUS

## 2015-07-26 ENCOUNTER — Encounter (HOSPITAL_COMMUNITY): Payer: Self-pay | Admitting: Emergency Medicine

## 2015-07-26 ENCOUNTER — Ambulatory Visit (HOSPITAL_COMMUNITY)
Admission: EM | Admit: 2015-07-26 | Discharge: 2015-07-26 | Disposition: A | Discharge status: Home / Self Care | Payer: Medicaid Other | Attending: Family Medicine | Admitting: Family Medicine

## 2015-07-26 DIAGNOSIS — G500 Trigeminal neuralgia: Secondary | ICD-10-CM | POA: Diagnosis not present

## 2015-07-26 MED ORDER — OXYCODONE-ACETAMINOPHEN 5-325 MG PO TABS: 2.0000 | ORAL_TABLET | ORAL | Status: DC | PRN

## 2015-07-26 NOTE — ED Provider Notes (Signed)
CSN: [id number]     Arrival date & time 07/26/15  1608 History   First MD Initiated Contact with Patient 07/26/15 1749     Chief Complaint  Patient presents with  . Pain   (Consider location/radiation/quality/duration/timing/severity/associated sxs/prior Treatment) HPI History obtained from patient:  Pt presents with the cc of:  Pain right face Duration of symptoms: months Treatment prior to arrival: none Context: due to have a neurosurg procedure later this month, but states her trigem neuralgia is bothering her a great deal. States she was told by her surgeon to come to UC for dilaudid. Other symptoms include: headache Pain score: 6 FAMILY HISTORY: migraines-mother    Past Medical History  Diagnosis Date  . Hypertension   . Bipolar 1 disorder (HCC)   . Anxiety   . Bulging lumbar disc   . Ruptured intervertebral disc   . Head injury   . Short-term memory loss   . Depression   . Hypothyroidism   . Goiter   . Facial pain 11/2013    walking dog when she tripped and "faceplanted" landing on her face.   . Facial nerve injury   . Hyperlipidemia   . Heart murmur   . Chronic bronchitis (HCC)   . GERD (gastroesophageal reflux disease)   . Migraine     "weekly" (03/23/2015)  . Seizures (HCC)     "I've had a few; had a grand mal when I took Neurotin; still take Neurotin" (03/23/2015)  . Chronic lower back pain    Past Surgical History  Procedure Laterality Date  . Tonsillectomy    . Abdominal hysterectomy    . Cesarean section  2004  . Eye surgery Right 11/2013    emergent lateral canthotomy due to proptosis  /notes 11/30/2013   Family History  Problem Relation Age of Onset  . Migraines Mother   . Migraines Father   . Other Father   . Migraines Sister    Social History  Substance Use Topics  . Smoking status: Former Smoker    Types: Cigarettes  . Smokeless tobacco: Never Used     Comment: "smoked cigarettes when I was 50 years old"  . Alcohol Use: Yes     Comment:  03/23/2015 "might drink at Christmastime"   OB History    No data available     Review of Systems  Denies: HEADACHE, NAUSEA, ABDOMINAL PAIN, CHEST PAIN, CONGESTION, DYSURIA, SHORTNESS OF BREATH  Allergies  Toradol; Suboxone; and Trazodone and nefazodone  Home Medications   Prior to Admission medications   Medication Sig Start Date End Date Taking? Authorizing Provider  BACLOFEN PO Take by mouth.   Yes Historical Provider, MD  LISINOPRIL PO Take by mouth.   Yes Historical Provider, MD  b complex vitamins tablet Take 1 tablet by mouth daily.    Historical Provider, MD  Carbamazepine (EQUETRO) 300 MG CP12 Take 1 capsule (300 mg total) by mouth daily. 06/15/15   Shari Prows, MD  Cholecalciferol (VITAMIN D PO) Take 1 tablet by mouth daily. Reported on 05/26/2015    Historical Provider, MD  clonazePAM Scarlette Calico) 1 MG tablet Takes 1 tablet by mouth four times a day 04/14/15   Historical Provider, MD  DULoxetine (CYMBALTA) 60 MG capsule Take 1 capsule (60 mg total) by mouth daily. 05/03/15   Oneta Rack, NP  gabapentin (NEURONTIN) 400 MG capsule Take 2 capsules (800 mg total) by mouth 3 (three) times daily. 06/14/15   Nishant Dhungel, MD  hydrochlorothiazide (HYDRODIURIL) 25  MG tablet Take 25 mg by mouth daily.    Historical Provider, MD  hydrOXYzine (ATARAX/VISTARIL) 50 MG tablet Take 1 tablet (50 mg total) by mouth 3 (three) times daily as needed for anxiety (sleep). 06/15/15   Shari ProwsJolanta B Pucilowska, MD  levothyroxine (SYNTHROID, LEVOTHROID) 100 MCG tablet Take 100 mcg by mouth daily before breakfast.    Historical Provider, MD  Multiple Vitamin (MULTIVITAMIN WITH MINERALS) TABS tablet Take 1 tablet by mouth daily.     Historical Provider, MD  PROAIR HFA 108 [phone](90 Base) MCG/ACT inhaler Take 2 puffs by mouth every 4 (four) hours as needed. 05/21/15   Historical Provider, MD  QVAR 80 MCG/ACT inhaler Take 2 puffs by mouth 2 (two) times daily. 05/21/15   Historical Provider, MD  thiamine 100 MG tablet  Take 1 tablet (100 mg total) by mouth daily. 04/27/15   Vassie Lollarlos Madera, MD   Meds Ordered and Administered this Visit  Medications - No data to display  BP 123/87 mmHg  Pulse 71  Temp(Src) 98.3 F (36.8 C) (Oral)  Resp 20  SpO2 95% No data found.   Physical Exam NURSES NOTES AND VITAL SIGNS REVIEWED. CONSTITUTIONAL: Well developed, well nourished, no acute distress HEENT: normocephalic, atraumatic EYES: Conjunctiva normal NECK:normal ROM, supple, no adenopathy PULMONARY:No respiratory distress, normal effort ABDOMINAL: Soft, ND, NT BS+, No CVAT MUSCULOSKELETAL: Normal ROM of all extremities,  SKIN: warm and dry without rash PSYCHIATRIC: Mood and affect, behavior are normal  ED Course  Procedures (including critical care time)  Labs Review Labs Reviewed - No data to display  Imaging Review No results found.   Visual Acuity Review  Right Eye Distance:   Left Eye Distance:   Bilateral Distance:    Right Eye Near:   Left Eye Near:    Bilateral Near:      Discussed with on call neurosurgeon: spoke with pts neurosurgeon, he did not order dilaudid, but states he did suggest that she come to UC if she needed pain medication.   I have advised pt I am not comfortable administrating dilaudid for chronic pain. Happy to give her her usual pain control rx. Pt states she is OK with this.  Nurse present during discussion.   MDM   1. Trigeminal neuralgia of right side of face    Pt is advised to follow up with neurosurgeon.     Tharon AquasFrank C Burrel Legrand, PA 07/26/15 [place] [id number]

## 2015-07-26 NOTE — ED Notes (Signed)
At bedside when Brittany SarksFrank Patrick, Brittany [name] spoke to patient and family member about his phone conversation with dr Brittany [name].  Patient and family member notified that dr Brittany [name] did not order a dilaudid shot for her and that current provider not comfortable ordering this medication.

## 2015-07-26 NOTE — Discharge Instructions (Signed)
Trigeminal Neuralgia °Trigeminal neuralgia is a nerve disorder that causes attacks of severe facial pain. The attacks last from a few seconds to several minutes. They can happen for days, weeks, or months and then go away for months or years. Trigeminal neuralgia is also called tic douloureux. °CAUSES °This condition is caused by damage to a nerve in the face that is called the trigeminal nerve. An attack can be triggered by: °· Talking. °· Chewing. °· Putting on makeup. °· Washing your face. °· Shaving your face. °· Brushing your teeth. °· Touching your face. °RISK FACTORS °This condition is more likely to develop in: °· Women. °· People who are 50 years of age or older. °SYMPTOMS °The main symptom of this condition is pain in the jaw, lips, eyes, nose, scalp, forehead, and face. The pain may be intense, stabbing, electric, or shock-like. °DIAGNOSIS °This condition is diagnosed with a physical exam. A CT scan or MRI may be done to rule out other conditions that can cause facial pain. °TREATMENT °This condition may be treated with: °· Avoiding the things that trigger your attacks. °· Pain medicine. °· Surgery. This may be done in severe cases if other medical treatment does not provide relief. °HOME CARE INSTRUCTIONS °· Take over-the-counter and prescription medicines only as told by your health care provider. °· If you wish to get pregnant, talk with your health care provider before you start trying to get pregnant. °· Avoid the things that trigger your attacks. It may help to: °¨ Chew on the unaffected side of your mouth. °¨ Avoid touching your face. °¨ Avoid blasts of hot or cold air. °SEEK MEDICAL CARE IF: °· Your pain medicine is not helping. °· You develop new, unexplained symptoms, such as: °¨ Double vision. °¨ Facial weakness. °¨ Changes in hearing or balance. °· You become pregnant. °SEEK IMMEDIATE MEDICAL CARE IF: °· Your pain is unbearable, and your pain medicine does not help. °  °This information is not  intended to replace advice given to you by your health care provider. Make sure you discuss any questions you have with your health care provider. °  °Document Released: 01/28/2000 Document Revised: 10/21/2014 Document Reviewed: 05/25/2014 °Elsevier Interactive Patient Education ©2016 [employer] ° °Neuropathic Pain °Neuropathic pain is pain caused by damage to the nerves that are responsible for certain sensations in your body (sensory nerves). The pain can be caused by damage to:  °· The sensory nerves that send signals to your spinal cord and brain (peripheral nervous system). °· The sensory nerves in your brain or spinal cord (central nervous system). °Neuropathic pain can make you more sensitive to pain. What would be a minor sensation for most people may feel very painful if you have neuropathic pain. This is usually a long-term condition that can be difficult to treat. The type of pain can differ from person to person. It may start suddenly (acute), or it may develop slowly and last for a long time (chronic). Neuropathic pain may come and go as damaged nerves heal or may stay at the same level for years. It often causes emotional distress, loss of sleep, and a lower quality of life. °CAUSES  °The most common cause of damage to a sensory nerve is diabetes. Many other diseases and conditions can also cause neuropathic pain. Causes of neuropathic pain can be classified as: °· Toxic. Many drugs and chemicals can cause toxic damage. The most common cause of toxic neuropathic pain is damage from drug treatment for cancer (  chemotherapy). °· Metabolic. This type of pain can happen when a disease causes imbalances that damage nerves. Diabetes is the most common of these diseases. Vitamin B deficiency caused by long-term alcohol abuse is another common cause. °· Traumatic. Any injury that cuts, crushes, or stretches a nerve can cause damage and pain. A common example is feeling pain after losing an arm or leg  (phantom limb pain). °· Compression-related. If a sensory nerve gets trapped or compressed for a long period of time, the blood supply to the nerve can be cut off. °· Vascular. Many blood vessel diseases can cause neuropathic pain by decreasing blood supply and oxygen to nerves. °· Autoimmune. This type of pain results from diseases in which the body's defense system mistakenly attacks sensory nerves. Examples of autoimmune diseases that can cause neuropathic pain include lupus and multiple sclerosis. °· Infectious. Many types of viral infections can damage sensory nerves and cause pain. Shingles infection is a common cause of this type of pain. °· Inherited. Neuropathic pain can be a symptom of many diseases that are passed down through families (genetic). °SIGNS AND SYMPTOMS  °The main symptom is pain. Neuropathic pain is often described as: °· Burning. °· Shock-like. °· Stinging. °· Hot or cold. °· Itching. °DIAGNOSIS  °No single test can diagnose neuropathic pain. Your health care provider will do a physical exam and ask you about your pain. You may use a pain scale to describe how bad your pain is. You may also have tests to see if you have a high sensitivity to pain and to help find the cause and location of any sensory nerve damage. These tests may include: °· Imaging studies, such as: °¨ X-rays. °¨ CT scan. °¨ MRI. °· Nerve conduction studies to test how well nerve signals travel through your sensory nerves (electrodiagnostic testing). °· Stimulating your sensory nerves through electrodes on your skin and measuring the response in your spinal cord and brain (somatosensory evoked potentials). °TREATMENT  °Treatment for neuropathic pain may change over time. You may need to try different treatment options or a combination of treatments. Some options include: °· Over-the-counter pain relievers. °· Prescription medicines. Some medicines used to treat other conditions may also help neuropathic pain. These include  medicines to: °¨ Control seizures (anticonvulsants). °¨ Relieve depression (antidepressants). °· Prescription-strength pain relievers (narcotics). These are usually used when other pain relievers do not help. °· Transcutaneous nerve stimulation (TENS). This uses electrical currents to block painful nerve signals. The treatment is painless. °· Topical and local anesthetics. These are medicines that numb the nerves. They can be injected as a nerve block or applied to the skin. °· Alternative treatments, such as: °¨ Acupuncture. °¨ Meditation. °¨ Massage. °¨ Physical therapy. °¨ Pain management programs. °¨ Counseling. °HOME CARE INSTRUCTIONS °· Learn as much as you can about your condition. °· Take medicines only as directed by your health care provider. °· Work closely with all your health care providers to find what works best for you. °· Have a good support system at home. °· Consider joining a chronic pain support group. °SEEK MEDICAL CARE IF: °· Your pain treatments are not helping. °· You are having side effects from your medicines. °· You are struggling with fatigue, mood changes, depression, or anxiety. °  °This information is not intended to replace advice given to you by your health care provider. Make sure you discuss any questions you have with your health care provider. °  °Document Released: 10/28/2003 Document Revised: 02/20/2014   Document Reviewed: 07/10/2013 °Elsevier Interactive Patient Education ©2016 [employer] Inc[employer] ° °

## 2015-07-26 NOTE — ED Notes (Signed)
Patient reports having trigeminal neuralgia pain for 2 weeks on right side of face.  Patient was seen by dr cabbel  And sentAlphonzo Lemmings to [facility] for possible pain medicine injection.  Dr Alphonzo Lemmingsabbel is planning to do surgery on patient on 6/23 .  Patient reports tight right facial pain is severe.

## 2015-07-27 ENCOUNTER — Other Ambulatory Visit: Payer: Self-pay | Admitting: Neurosurgery

## 2015-08-02 ENCOUNTER — Encounter (HOSPITAL_COMMUNITY): Payer: Self-pay | Admitting: Emergency Medicine

## 2015-08-02 ENCOUNTER — Other Ambulatory Visit: Payer: Self-pay

## 2015-08-02 ENCOUNTER — Inpatient Hospital Stay (HOSPITAL_COMMUNITY): Payer: Medicaid Other

## 2015-08-02 ENCOUNTER — Emergency Department (HOSPITAL_COMMUNITY): Payer: Medicaid Other

## 2015-08-02 ENCOUNTER — Inpatient Hospital Stay (HOSPITAL_COMMUNITY)
Admission: EM | Admit: 2015-08-02 | Discharge: 2015-08-05 | DRG: 917 | Disposition: A | Discharge status: Home / Self Care | Payer: Medicaid Other | Attending: Internal Medicine | Admitting: Internal Medicine

## 2015-08-02 DIAGNOSIS — T40604A Poisoning by unspecified narcotics, undetermined, initial encounter: Secondary | ICD-10-CM

## 2015-08-02 DIAGNOSIS — R569 Unspecified convulsions: Secondary | ICD-10-CM | POA: Diagnosis present

## 2015-08-02 DIAGNOSIS — E049 Nontoxic goiter, unspecified: Secondary | ICD-10-CM | POA: Diagnosis present

## 2015-08-02 DIAGNOSIS — T1491 Suicide attempt: Secondary | ICD-10-CM | POA: Diagnosis not present

## 2015-08-02 DIAGNOSIS — F603 Borderline personality disorder: Secondary | ICD-10-CM | POA: Diagnosis present

## 2015-08-02 DIAGNOSIS — G9340 Encephalopathy, unspecified: Secondary | ICD-10-CM

## 2015-08-02 DIAGNOSIS — T413X4A Poisoning by local anesthetics, undetermined, initial encounter: Secondary | ICD-10-CM

## 2015-08-02 DIAGNOSIS — Z90710 Acquired absence of both cervix and uterus: Secondary | ICD-10-CM | POA: Diagnosis not present

## 2015-08-02 DIAGNOSIS — T50901D Poisoning by unspecified drugs, medicaments and biological substances, accidental (unintentional), subsequent encounter: Secondary | ICD-10-CM | POA: Diagnosis not present

## 2015-08-02 DIAGNOSIS — F419 Anxiety disorder, unspecified: Secondary | ICD-10-CM | POA: Diagnosis present

## 2015-08-02 DIAGNOSIS — E162 Hypoglycemia, unspecified: Secondary | ICD-10-CM | POA: Diagnosis present

## 2015-08-02 DIAGNOSIS — J9601 Acute respiratory failure with hypoxia: Secondary | ICD-10-CM | POA: Diagnosis not present

## 2015-08-02 DIAGNOSIS — Z6839 Body mass index (BMI) 39.0-39.9, adult: Secondary | ICD-10-CM

## 2015-08-02 DIAGNOSIS — Z886 Allergy status to analgesic agent status: Secondary | ICD-10-CM

## 2015-08-02 DIAGNOSIS — R011 Cardiac murmur, unspecified: Secondary | ICD-10-CM | POA: Diagnosis present

## 2015-08-02 DIAGNOSIS — G8929 Other chronic pain: Secondary | ICD-10-CM | POA: Diagnosis present

## 2015-08-02 DIAGNOSIS — E785 Hyperlipidemia, unspecified: Secondary | ICD-10-CM | POA: Diagnosis present

## 2015-08-02 DIAGNOSIS — T40601A Poisoning by unspecified narcotics, accidental (unintentional), initial encounter: Secondary | ICD-10-CM | POA: Diagnosis present

## 2015-08-02 DIAGNOSIS — G500 Trigeminal neuralgia: Secondary | ICD-10-CM | POA: Diagnosis present

## 2015-08-02 DIAGNOSIS — Z885 Allergy status to narcotic agent status: Secondary | ICD-10-CM

## 2015-08-02 DIAGNOSIS — E875 Hyperkalemia: Secondary | ICD-10-CM | POA: Diagnosis present

## 2015-08-02 DIAGNOSIS — R001 Bradycardia, unspecified: Secondary | ICD-10-CM | POA: Diagnosis present

## 2015-08-02 DIAGNOSIS — J9600 Acute respiratory failure, unspecified whether with hypoxia or hypercapnia: Secondary | ICD-10-CM

## 2015-08-02 DIAGNOSIS — M545 Low back pain: Secondary | ICD-10-CM | POA: Diagnosis present

## 2015-08-02 DIAGNOSIS — Z6841 Body Mass Index (BMI) 40.0 and over, adult: Secondary | ICD-10-CM

## 2015-08-02 DIAGNOSIS — R4182 Altered mental status, unspecified: Secondary | ICD-10-CM | POA: Diagnosis present

## 2015-08-02 DIAGNOSIS — K219 Gastro-esophageal reflux disease without esophagitis: Secondary | ICD-10-CM | POA: Diagnosis present

## 2015-08-02 DIAGNOSIS — I119 Hypertensive heart disease without heart failure: Secondary | ICD-10-CM | POA: Diagnosis present

## 2015-08-02 DIAGNOSIS — I10 Essential (primary) hypertension: Secondary | ICD-10-CM | POA: Diagnosis not present

## 2015-08-02 DIAGNOSIS — F332 Major depressive disorder, recurrent severe without psychotic features: Secondary | ICD-10-CM | POA: Insufficient documentation

## 2015-08-02 DIAGNOSIS — E039 Hypothyroidism, unspecified: Secondary | ICD-10-CM | POA: Diagnosis present

## 2015-08-02 DIAGNOSIS — T424X1A Poisoning by benzodiazepines, accidental (unintentional), initial encounter: Secondary | ICD-10-CM | POA: Diagnosis present

## 2015-08-02 DIAGNOSIS — E876 Hypokalemia: Secondary | ICD-10-CM | POA: Diagnosis not present

## 2015-08-02 DIAGNOSIS — T50901A Poisoning by unspecified drugs, medicaments and biological substances, accidental (unintentional), initial encounter: Secondary | ICD-10-CM

## 2015-08-02 DIAGNOSIS — Z87891 Personal history of nicotine dependence: Secondary | ICD-10-CM

## 2015-08-02 DIAGNOSIS — T50904A Poisoning by unspecified drugs, medicaments and biological substances, undetermined, initial encounter: Secondary | ICD-10-CM | POA: Diagnosis present

## 2015-08-02 DIAGNOSIS — T50904D Poisoning by unspecified drugs, medicaments and biological substances, undetermined, subsequent encounter: Secondary | ICD-10-CM | POA: Diagnosis not present

## 2015-08-02 DIAGNOSIS — T48202A Poisoning by unspecified drugs acting on muscles, intentional self-harm, initial encounter: Secondary | ICD-10-CM | POA: Diagnosis not present

## 2015-08-02 LAB — TSH: TSH: 1.903 u[IU]/mL (ref 0.350–4.500)

## 2015-08-02 LAB — CBC WITH DIFFERENTIAL/PLATELET
Basophils Absolute: 0 10*3/uL (ref 0.0–0.1)
Basophils Relative: 0 %
EOS ABS: 0.2 10*3/uL (ref 0.0–0.7)
Eosinophils Relative: 2 %
HCT: 42.5 % (ref 36.0–46.0)
Hemoglobin: 13.5 g/dL (ref 12.0–15.0)
Lymphocytes Relative: 28 %
Lymphs Abs: 2.1 10*3/uL (ref 0.7–4.0)
MCH: 29.2 pg (ref 26.0–34.0)
MCHC: 31.8 g/dL (ref 30.0–36.0)
MCV: 91.8 fL (ref 78.0–100.0)
MONO ABS: 0.8 10*3/uL (ref 0.1–1.0)
Monocytes Relative: 10 %
NEUTROS PCT: 60 %
Neutro Abs: 4.5 10*3/uL (ref 1.7–7.7)
PLATELETS: 292 10*3/uL (ref 150–400)
RBC: 4.63 MIL/uL (ref 3.87–5.11)
RDW: 14.4 % (ref 11.5–15.5)
WBC: 7.6 10*3/uL (ref 4.0–10.5)

## 2015-08-02 LAB — I-STAT ARTERIAL BLOOD GAS, ED
ACID-BASE EXCESS: 5 mmol/L — AB (ref 0.0–2.0)
Bicarbonate: 32.2 mEq/L — ABNORMAL HIGH (ref 20.0–24.0)
O2 Saturation: 100 %
PH ART: 7.338 — AB (ref 7.350–7.450)
TCO2: 34 mmol/L (ref 0–100)
pCO2 arterial: 60 mmHg (ref 35.0–45.0)
pO2, Arterial: 432 mmHg — ABNORMAL HIGH (ref 80.0–100.0)

## 2015-08-02 LAB — COMPREHENSIVE METABOLIC PANEL
ALK PHOS: 60 U/L (ref 38–126)
ALT: 14 U/L (ref 14–54)
AST: 58 U/L — ABNORMAL HIGH (ref 15–41)
Albumin: 3.7 g/dL (ref 3.5–5.0)
Anion gap: 3 — ABNORMAL LOW (ref 5–15)
BILIRUBIN TOTAL: 1.4 mg/dL — AB (ref 0.3–1.2)
BUN: 6 mg/dL (ref 6–20)
CALCIUM: 8.6 mg/dL — AB (ref 8.9–10.3)
CO2: 29 mmol/L (ref 22–32)
CREATININE: 0.74 mg/dL (ref 0.44–1.00)
Chloride: 113 mmol/L — ABNORMAL HIGH (ref 101–111)
Glucose, Bld: 94 mg/dL (ref 65–99)
Potassium: 5.5 mmol/L — ABNORMAL HIGH (ref 3.5–5.1)
Sodium: 145 mmol/L (ref 135–145)
Total Protein: 6.2 g/dL — ABNORMAL LOW (ref 6.5–8.1)

## 2015-08-02 LAB — URINALYSIS, ROUTINE W REFLEX MICROSCOPIC
Bilirubin Urine: NEGATIVE
GLUCOSE, UA: NEGATIVE mg/dL
Hgb urine dipstick: NEGATIVE
Ketones, ur: NEGATIVE mg/dL
LEUKOCYTES UA: NEGATIVE
NITRITE: NEGATIVE
PROTEIN: NEGATIVE mg/dL
Specific Gravity, Urine: 1.015 (ref 1.005–1.030)
pH: 7 (ref 5.0–8.0)

## 2015-08-02 LAB — GLUCOSE, CAPILLARY
GLUCOSE-CAPILLARY: 83 mg/dL (ref 65–99)
GLUCOSE-CAPILLARY: 65 mg/dL (ref 65–99)
Glucose-Capillary: 65 mg/dL (ref 65–99)
Glucose-Capillary: 86 mg/dL (ref 65–99)

## 2015-08-02 LAB — RAPID URINE DRUG SCREEN, HOSP PERFORMED
AMPHETAMINES: NOT DETECTED
Barbiturates: POSITIVE — AB
Benzodiazepines: POSITIVE — AB
Cocaine: NOT DETECTED
OPIATES: POSITIVE — AB
Tetrahydrocannabinol: NOT DETECTED

## 2015-08-02 LAB — CK: CK TOTAL: 538 U/L — AB (ref 38–234)

## 2015-08-02 LAB — MRSA PCR SCREENING: MRSA by PCR: NEGATIVE

## 2015-08-02 LAB — ACETAMINOPHEN LEVEL: Acetaminophen (Tylenol), Serum: <10 ug/mL — ABNORMAL LOW (ref 10–30)

## 2015-08-02 LAB — SALICYLATE LEVEL

## 2015-08-02 LAB — ETHANOL: Alcohol, Ethyl (B): <5 mg/dL (ref <5)

## 2015-08-02 LAB — CARBAMAZEPINE LEVEL, TOTAL

## 2015-08-02 LAB — TRIGLYCERIDES: Triglycerides: 386 mg/dL — ABNORMAL HIGH (ref <150)

## 2015-08-02 LAB — PREGNANCY, URINE: PREG TEST UR: NEGATIVE

## 2015-08-02 MED ORDER — SODIUM CHLORIDE 0.9 % IV SOLN: INTRAVENOUS | Status: DC

## 2015-08-02 MED ORDER — ETOMIDATE 2 MG/ML IV SOLN
0.3000 mg/kg | Freq: Once | INTRAVENOUS | Status: AC
  Administered 2015-08-02: 20 mg via INTRAVENOUS

## 2015-08-02 MED ORDER — SODIUM CHLORIDE 0.9 % IV SOLN: 250.0000 mL | INTRAVENOUS | Status: DC | PRN

## 2015-08-02 MED ORDER — MIDAZOLAM HCL 2 MG/2ML IJ SOLN
INTRAMUSCULAR | Status: AC
  Administered 2015-08-02: 2 mg
  Filled 2015-08-02: qty 2

## 2015-08-02 MED ORDER — DEXTROSE 50 % IV SOLN
INTRAVENOUS | Status: AC
  Filled 2015-08-02: qty 50

## 2015-08-02 MED ORDER — PROPOFOL 1000 MG/100ML IV EMUL
0.0000 ug/kg/min | INTRAVENOUS | Status: DC
  Administered 2015-08-02 (×2): 20 ug/kg/min via INTRAVENOUS
  Administered 2015-08-03 (×2): 30 ug/kg/min via INTRAVENOUS
  Filled 2015-08-02 (×3): qty 100

## 2015-08-02 MED ORDER — DEXTROSE 50 % IV SOLN
INTRAVENOUS | Status: AC
  Administered 2015-08-02: 50 mL
  Filled 2015-08-02: qty 50

## 2015-08-02 MED ORDER — SODIUM CHLORIDE 0.9 % IV BOLUS (SEPSIS)
1000.0000 mL | Freq: Once | INTRAVENOUS | Status: AC
  Administered 2015-08-02: 1000 mL via INTRAVENOUS

## 2015-08-02 MED ORDER — LEVOTHYROXINE SODIUM 100 MCG IV SOLR
50.0000 ug | Freq: Every day | INTRAVENOUS | Status: DC
  Administered 2015-08-02 – 2015-08-03 (×2): 50 ug via INTRAVENOUS
  Filled 2015-08-02 (×3): qty 5

## 2015-08-02 MED ORDER — HEPARIN SODIUM (PORCINE) 5000 UNIT/ML IJ SOLN
5000.0000 [IU] | Freq: Three times a day (TID) | INTRAMUSCULAR | Status: DC
  Administered 2015-08-02 – 2015-08-05 (×8): 5000 [IU] via SUBCUTANEOUS
  Filled 2015-08-02 (×8): qty 1

## 2015-08-02 MED ORDER — SODIUM CHLORIDE 0.9 % IV SOLN
25.0000 ug/h | INTRAVENOUS | Status: DC
  Administered 2015-08-02: 50 ug/h via INTRAVENOUS
  Filled 2015-08-02: qty 50

## 2015-08-02 MED ORDER — BISACODYL 10 MG RE SUPP: 10.0000 mg | Freq: Every day | RECTAL | Status: DC | PRN

## 2015-08-02 MED ORDER — DEXTROSE 50 % IV SOLN
1.0000 | Freq: Once | INTRAVENOUS | Status: AC
  Administered 2015-08-02: 50 mL via INTRAVENOUS

## 2015-08-02 MED ORDER — CETYLPYRIDINIUM CHLORIDE 0.05 % MT LIQD
7.0000 mL | Freq: Two times a day (BID) | OROMUCOSAL | Status: DC
  Administered 2015-08-04 (×2): 7 mL via OROMUCOSAL

## 2015-08-02 MED ORDER — PRO-STAT SUGAR FREE PO LIQD
30.0000 mL | Freq: Two times a day (BID) | ORAL | Status: DC
  Administered 2015-08-02: 30 mL
  Filled 2015-08-02 (×4): qty 30

## 2015-08-02 MED ORDER — PROPOFOL 1000 MG/100ML IV EMUL
INTRAVENOUS | Status: AC
  Filled 2015-08-02: qty 100

## 2015-08-02 MED ORDER — ALBUTEROL SULFATE (2.5 MG/3ML) 0.083% IN NEBU: 2.5000 mg | INHALATION_SOLUTION | RESPIRATORY_TRACT | Status: DC | PRN

## 2015-08-02 MED ORDER — CARBAMAZEPINE 200 MG PO TABS
300.0000 mg | ORAL_TABLET | Freq: Every day | ORAL | Status: DC
  Administered 2015-08-02 – 2015-08-05 (×4): 300 mg via ORAL
  Filled 2015-08-02 (×2): qty 1.5
  Filled 2015-08-02: qty 2
  Filled 2015-08-02: qty 1.5

## 2015-08-02 MED ORDER — PANTOPRAZOLE SODIUM 40 MG PO PACK
40.0000 mg | PACK | Freq: Every day | ORAL | Status: DC
  Administered 2015-08-02: 40 mg
  Filled 2015-08-02 (×3): qty 20

## 2015-08-02 MED ORDER — ACETAMINOPHEN 325 MG PO TABS: 650.0000 mg | ORAL_TABLET | ORAL | Status: DC | PRN

## 2015-08-02 MED ORDER — VITAL HIGH PROTEIN PO LIQD
1000.0000 mL | ORAL | Status: DC
  Administered 2015-08-02 (×2): 1000 mL
  Administered 2015-08-03 (×2)

## 2015-08-02 MED ORDER — NALOXONE HCL 2 MG/2ML IJ SOSY
2.0000 mg | PREFILLED_SYRINGE | Freq: Once | INTRAMUSCULAR | Status: AC
Start: 2015-08-02 — End: 2015-08-02
  Administered 2015-08-02: 2 mg via INTRAVENOUS
  Filled 2015-08-02: qty 2

## 2015-08-02 MED ORDER — CHLORHEXIDINE GLUCONATE 0.12 % MT SOLN
15.0000 mL | Freq: Two times a day (BID) | OROMUCOSAL | Status: DC
  Administered 2015-08-02 – 2015-08-04 (×4): 15 mL via OROMUCOSAL
  Filled 2015-08-02: qty 15

## 2015-08-02 MED ORDER — FENTANYL CITRATE (PF) 100 MCG/2ML IJ SOLN: 50.0000 ug | Freq: Once | INTRAMUSCULAR | Status: DC

## 2015-08-02 MED ORDER — NALOXONE HCL 2 MG/2ML IJ SOSY
1.0000 mg | PREFILLED_SYRINGE | Freq: Once | INTRAMUSCULAR | Status: AC
  Administered 2015-08-02: 1 mg via INTRAVENOUS
  Filled 2015-08-02: qty 2

## 2015-08-02 MED ORDER — NALOXONE HCL 2 MG/2ML IJ SOSY
1.0000 mg/h | PREFILLED_SYRINGE | INTRAVENOUS | Status: DC
  Administered 2015-08-02: 1 mg/h via INTRAVENOUS
  Filled 2015-08-02: qty 4

## 2015-08-02 MED ORDER — FENTANYL BOLUS VIA INFUSION
50.0000 ug | INTRAVENOUS | Status: DC | PRN
  Administered 2015-08-02 – 2015-08-03 (×4): 50 ug via INTRAVENOUS
  Filled 2015-08-02: qty 50

## 2015-08-02 MED ORDER — ROCURONIUM BROMIDE 50 MG/5ML IV SOLN
1.0000 mg/kg | Freq: Once | INTRAVENOUS | Status: AC
  Administered 2015-08-02: 50 mg via INTRAVENOUS
  Filled 2015-08-02: qty 10

## 2015-08-02 MED ORDER — FENTANYL CITRATE (PF) 100 MCG/2ML IJ SOLN
INTRAMUSCULAR | Status: AC
  Administered 2015-08-02: 100 ug
  Filled 2015-08-02: qty 2

## 2015-08-02 NOTE — ED Notes (Signed)
Per EMS: pt found in bed sleeping with snoring respirations; pt family sts hx of overdose in past; pt takes baclofin

## 2015-08-02 NOTE — Progress Notes (Signed)
Patient transported from ED to room 2M08 without complications.   

## 2015-08-02 NOTE — ED Notes (Signed)
Patient transported to CT 

## 2015-08-02 NOTE — ED Notes (Signed)
Patient transported to X-ray 

## 2015-08-02 NOTE — ED Provider Notes (Signed)
CSN: [id number]     Arrival date & time 08/02/15  1045 History   First MD Initiated Contact with Patient 08/02/15 1052     Chief Complaint  Patient presents with  . Drug Overdose   PT IS A 50 YO WF WHO TAKES MULTIPLE SEDATING PAIN MEDS, BUT NO NARCOTICS PER CHART.  THE PT WAS FOUND BY HER HUSBAND WITH SNORING RESPIRATIONS.  SHE WOULD NOT RESPOND TO HER HUSBAND, SO HE CALLED EMS.  THE FAMILY TOLD EMS THAT SHE HAS ACCIDENTALLY OVERDOSED IN THE PAST.  PT IS OBTUNDED AND IS UNABLE TO GIVE ANY HX.  (Consider location/radiation/quality/duration/timing/severity/associated sxs/prior Treatment) Patient is a 50 y.o. female presenting with Overdose. The history is provided by the EMS personnel.  Drug Overdose This is a new problem. Nothing aggravates the symptoms. Nothing relieves the symptoms.    Past Medical History  Diagnosis Date  . Hypertension   . Bipolar 1 disorder (HCC)   . Anxiety   . Bulging lumbar disc   . Ruptured intervertebral disc   . Head injury   . Short-term memory loss   . Depression   . Hypothyroidism   . Goiter   . Facial pain 11/2013    walking dog when she tripped and "faceplanted" landing on her face.   . Facial nerve injury   . Hyperlipidemia   . Heart murmur   . Chronic bronchitis (HCC)   . GERD (gastroesophageal reflux disease)   . Migraine     "weekly" (03/23/2015)  . Seizures (HCC)     "I've had a few; had a grand mal when I took Neurotin; still take Neurotin" (03/23/2015)  . Chronic lower back pain    Past Surgical History  Procedure Laterality Date  . Tonsillectomy    . Abdominal hysterectomy    . Cesarean section  2004  . Eye surgery Right 11/2013    emergent lateral canthotomy due to proptosis  /notes 11/30/2013   Family History  Problem Relation Age of Onset  . Migraines Mother   . Migraines Father   . Other Father   . Migraines Sister    Social History  Substance Use Topics  . Smoking status: Former Smoker    Types: Cigarettes  .  Smokeless tobacco: Never Used     Comment: "smoked cigarettes when I was 50 years old"  . Alcohol Use: Yes     Comment: 03/23/2015 "might drink at Christmastime"   OB History    No data available     Review of Systems  Unable to perform ROS: Patient unresponsive      Allergies  Toradol; Suboxone; and Trazodone and nefazodone  Home Medications   Prior to Admission medications   Medication Sig Start Date End Date Taking? Authorizing Provider  b complex vitamins tablet Take 1 tablet by mouth daily.    Historical Provider, MD  BACLOFEN PO Take by mouth.    Historical Provider, MD  Carbamazepine (EQUETRO) 300 MG CP12 Take 1 capsule (300 mg total) by mouth daily. 06/15/15   Shari ProwsJolanta B Pucilowska, MD  Cholecalciferol (VITAMIN D PO) Take 1 tablet by mouth daily. Reported on 05/26/2015    Historical Provider, MD  clonazePAM Scarlette Calico(KLONOPIN) 1 MG tablet Takes 1 tablet by mouth four times a day 04/14/15   Historical Provider, MD  DULoxetine (CYMBALTA) 60 MG capsule Take 1 capsule (60 mg total) by mouth daily. 05/03/15   Oneta Rackanika N Lewis, NP  gabapentin (NEURONTIN) 400 MG capsule Take 2 capsules (800 mg total)  by mouth 3 (three) times daily. 06/14/15   Nishant Dhungel, MD  hydrochlorothiazide (HYDRODIURIL) 25 MG tablet Take 25 mg by mouth daily.    Historical Provider, MD  hydrOXYzine (ATARAX/VISTARIL) 50 MG tablet Take 1 tablet (50 mg total) by mouth 3 (three) times daily as needed for anxiety (sleep). 06/15/15   Shari Prows, MD  levothyroxine (SYNTHROID, LEVOTHROID) 100 MCG tablet Take 100 mcg by mouth daily before breakfast.    Historical Provider, MD  LISINOPRIL PO Take by mouth.    Historical Provider, MD  Multiple Vitamin (MULTIVITAMIN WITH MINERALS) TABS tablet Take 1 tablet by mouth daily.     Historical Provider, MD  oxyCODONE-acetaminophen (PERCOCET/ROXICET) 5-325 MG tablet Take 2 tablets by mouth every 4 (four) hours as needed for severe pain. 07/26/15   Tharon Aquas, PA  PROAIR HFA 108 [phone]  Base) MCG/ACT inhaler Take 2 puffs by mouth every 4 (four) hours as needed. 05/21/15   Historical Provider, MD  QVAR 80 MCG/ACT inhaler Take 2 puffs by mouth 2 (two) times daily. 05/21/15   Historical Provider, MD  thiamine 100 MG tablet Take 1 tablet (100 mg total) by mouth daily. 04/27/15   Vassie Loll, MD   BP 180/109 mmHg  Pulse 56  Resp 14  SpO2 100% Physical Exam  Constitutional: She appears well-developed and well-nourished.  HENT:  Head: Normocephalic and atraumatic.  Right Ear: External ear normal.  Left Ear: External ear normal.  Nose: Nose normal.  Mouth/Throat: Oropharynx is clear and moist.  Eyes: Conjunctivae are normal. Pupils are equal, round, and reactive to light.  Cardiovascular: Regular rhythm, normal heart sounds and intact distal pulses.  Bradycardia present.   Pulmonary/Chest: Effort normal and breath sounds normal.  Abdominal: Soft. Bowel sounds are normal.  Neurological: She is unresponsive.  PT WITH SONOROUS RESPIRATIONS  Nursing note and vitals reviewed.   ED Course  Procedures (including critical care time) Labs Review Labs Reviewed  ACETAMINOPHEN LEVEL - Abnormal; Notable for the following:    Acetaminophen (Tylenol), Serum <10 (*)    All other components within normal limits  COMPREHENSIVE METABOLIC PANEL - Abnormal; Notable for the following:    Potassium 5.5 (*)    Chloride 113 (*)    Calcium 8.6 (*)    Total Protein 6.2 (*)    AST 58 (*)    Total Bilirubin 1.4 (*)    Anion gap 3 (*)    All other components within normal limits  URINE RAPID DRUG SCREEN, HOSP PERFORMED - Abnormal; Notable for the following:    Opiates POSITIVE (*)    Benzodiazepines POSITIVE (*)    Barbiturates POSITIVE (*)    All other components within normal limits  CK - Abnormal; Notable for the following:    Total CK 538 (*)    All other components within normal limits  CBC WITH DIFFERENTIAL/PLATELET  ETHANOL  SALICYLATE LEVEL  URINALYSIS, ROUTINE W REFLEX  MICROSCOPIC (NOT AT [facility])  PREGNANCY, URINE  MYOGLOBIN, SERUM  BLOOD GAS, ARTERIAL  TRIGLYCERIDES    Imaging Review Dg Chest 1 View  08/02/2015  CLINICAL DATA:  Drug overdose. EXAM: CHEST 1 VIEW COMPARISON:  None. FINDINGS: Mild cardiomegaly. No overt failure. No confluent opacities or effusions. No acute bony abnormality. IMPRESSION: Cardiomegaly.  No active disease. Electronically Signed   By: Charlett Nose M.D.   On: 08/02/2015 11:34   Ct Head Wo Contrast  08/02/2015  CLINICAL DATA:  Overdose. EXAM: CT HEAD WITHOUT CONTRAST TECHNIQUE: Contiguous axial images were  obtained from the base of the skull through the vertex without intravenous contrast. COMPARISON:  06/10/2015 FINDINGS: No acute intracranial abnormality. Specifically, no hemorrhage, hydrocephalus, mass lesion, acute infarction, or significant intracranial injury. No acute calvarial abnormality. Visualized paranasal sinuses and mastoids clear. Orbital soft tissues unremarkable. IMPRESSION: No acute intracranial abnormality. Electronically Signed   By: Charlett Nose M.D.   On: 08/02/2015 13:01   I have personally reviewed and evaluated these images and lab results as part of my medical decision-making.   EKG Interpretation None      MDM  PT HAD NO RESPONSE TO 1 MG OF NARCAN, BUT DID RESPOND TO 2 MG.  HR WENT UP AND RESPIRATIONS WENT UP.  HOWEVER, THAT WAS SHORT LIVED.  I PUT PT ON A NARCAN DRIP, AND SHE IS STILL VERY SOMNOLENT.  SHE WILL RESPOND TO PAIN, BUT WILL FALL RIGHT BACK ASLEEP.  PT D/W DR. Molli Knock WHO WILL ADMIT PT.  HE DECIDED TO INTUBATE PRIOR TO TRANSFER TO THE ICU. Final diagnoses:  Narcotic overdose, undetermined intent, initial encounter  Benzocaine overdose of undetermined intent, initial encounter      Jacalyn Lefevre, MD 08/02/15 1438

## 2015-08-02 NOTE — H&P (Signed)
PULMONARY / CRITICAL CARE MEDICINE   Name: Brittany [name] MRN: [id number] DOB: 29-Oct-1965    ADMISSION DATE:  08/02/2015 CONSULTATION DATE:  08/02/15  REFERRING MD:  Dr. Particia NearingHaviland / EDP   CHIEF COMPLAINT:  AMS, Overdose   HISTORY OF PRESENT ILLNESS:  50 y/o F with PMH of HTN, HLD, Murmur, migraines, GERD, hypothyroidism, goiter, chronic bronchitis, seizures, bipolar disorder, chronic low back pain, anxiety and frequent admissions in the past six months for overdose who [facility] after being found altered with snoring respirations.    The initial work up found the patient to be altered, bradycardic, minimal response to narcan but saturations of 100% on 4L. Initial labs Na 145, K 5.5, Cl 113, CO2 29, Sr Cr 0.74, AST 58, ALT 14, T. Bili 1.4, CK 538, WBC 7.6, Hgb 13.5 and platelets 292.  CXR showed cardiomegaly but no active disease.  The patient was treated with a narcan gtt with little improvement in mental status.  Exam showed little to no gag reflex with deep suction.  Home medication review showed long acting benzo's, percocet, atarax, cymbalta and carbamazepine.  Given concern for long acting medications and poor mental status, decision was made to intubate.    PCCM called for ICU admission.   PAST MEDICAL HISTORY :  She  has a past medical history of Hypertension; Bipolar 1 disorder (HCC); Anxiety; Bulging lumbar disc; Ruptured intervertebral disc; Head injury; Short-term memory loss; Depression; Hypothyroidism; Goiter; Facial pain (11/2013); Facial nerve injury; Hyperlipidemia; Heart murmur; Chronic bronchitis (HCC); GERD (gastroesophageal reflux disease); Migraine; Seizures (HCC); and Chronic lower back pain.  PAST SURGICAL HISTORY: She  has past surgical history that includes Tonsillectomy; Abdominal hysterectomy; Cesarean section (2004); and Eye surgery (Right, 11/2013).  Allergies  Allergen Reactions  . Toradol [Ketorolac Tromethamine] Swelling  . Suboxone  [Buprenorphine Hcl-Naloxone Hcl] Other (See Comments)    cellulitis   . Trazodone And Nefazodone Other (See Comments)    Bladder infection    No current facility-administered medications on file prior to encounter.   Current Outpatient Prescriptions on File Prior to Encounter  Medication Sig  . b complex vitamins tablet Take 1 tablet by mouth daily.  Marland Kitchen. BACLOFEN PO Take by mouth.  . Carbamazepine (EQUETRO) 300 MG CP12 Take 1 capsule (300 mg total) by mouth daily.  . Cholecalciferol (VITAMIN D PO) Take 1 tablet by mouth daily. Reported on 05/26/2015  . clonazePAM (KLONOPIN) 1 MG tablet Takes 1 tablet by mouth four times a day  . DULoxetine (CYMBALTA) 60 MG capsule Take 1 capsule (60 mg total) by mouth daily.  Marland Kitchen. gabapentin (NEURONTIN) 400 MG capsule Take 2 capsules (800 mg total) by mouth 3 (three) times daily.  . hydrochlorothiazide (HYDRODIURIL) 25 MG tablet Take 25 mg by mouth daily.  . hydrOXYzine (ATARAX/VISTARIL) 50 MG tablet Take 1 tablet (50 mg total) by mouth 3 (three) times daily as needed for anxiety (sleep).  Marland Kitchen. levothyroxine (SYNTHROID, LEVOTHROID) 100 MCG tablet Take 100 mcg by mouth daily before breakfast.  . LISINOPRIL PO Take by mouth.  . Multiple Vitamin (MULTIVITAMIN WITH MINERALS) TABS tablet Take 1 tablet by mouth daily.   Marland Kitchen. oxyCODONE-acetaminophen (PERCOCET/ROXICET) 5-325 MG tablet Take 2 tablets by mouth every 4 (four) hours as needed for severe pain.  Marland Kitchen. PROAIR HFA 108 (90 Base) MCG/ACT inhaler Take 2 puffs by mouth every 4 (four) hours as needed.  Marland Kitchen. QVAR 80 MCG/ACT inhaler Take 2 puffs by mouth 2 (two) times daily.  Marland Kitchen. thiamine 100  MG tablet Take 1 tablet (100 mg total) by mouth daily.    FAMILY HISTORY:  Her indicated that her mother is alive. She indicated that her father is deceased. She indicated that her sister is alive.   SOCIAL HISTORY: She  reports that she has quit smoking. Her smoking use included Cigarettes. She has never used smokeless tobacco. She  reports that she drinks alcohol. She reports that she does not use illicit drugs.  REVIEW OF SYSTEMS:  Unable to complete as patient is altered due to overdose.   SUBJECTIVE:  RN reports pt has been on Narcan gtt without significant change in mental status   VITAL SIGNS: BP 127/80 mmHg  Pulse 37  Resp 13  SpO2 100%  HEMODYNAMICS:    VENTILATOR SETTINGS:    INTAKE / OUTPUT:    PHYSICAL EXAMINATION: General:  Chronically ill appearing female, altered  Neuro:  No response to verbal stimuli, pupils 3mm, sluggish, moves ext's spontaneously to painful stimuli HEENT:  MM pink/moist, no jvd  Cardiovascular:  s1s2 rrr, no m/r/g, bradycardic in 30's/40's Lungs:  Non-labored, lungs bilaterally clear  Abdomen:  Obese/soft, bsx4 active  Musculoskeletal:  No acute deformities  Skin:  Warm/dry, no edema, dirty feet  LABS:  BMET  Recent Labs Lab 08/02/15 1216  NA 145  K 5.5*  CL 113*  CO2 29  BUN 6  CREATININE 0.74  GLUCOSE 94    Electrolytes  Recent Labs Lab 08/02/15 1216  CALCIUM 8.6*    CBC  Recent Labs Lab 08/02/15 1216  WBC 7.6  HGB 13.5  HCT 42.5  PLT 292    Coag's No results for input(s): APTT, INR in the last 168 hours.  Sepsis Markers No results for input(s): LATICACIDVEN, PROCALCITON, O2SATVEN in the last 168 hours.  ABG No results for input(s): PHART, PCO2ART, PO2ART in the last 168 hours.  Liver Enzymes  Recent Labs Lab 08/02/15 1216  AST 58*  ALT 14  ALKPHOS 60  BILITOT 1.4*  ALBUMIN 3.7    Cardiac Enzymes No results for input(s): TROPONINI, PROBNP in the last 168 hours.  Glucose No results for input(s): GLUCAP in the last 168 hours.  Imaging Dg Chest 1 View  08/02/2015  CLINICAL DATA:  Drug overdose. EXAM: CHEST 1 VIEW COMPARISON:  None. FINDINGS: Mild cardiomegaly. No overt failure. No confluent opacities or effusions. No acute bony abnormality. IMPRESSION: Cardiomegaly.  No active disease. Electronically Signed   By: Charlett Nose M.D.   On: 08/02/2015 11:34   Ct Head Wo Contrast  08/02/2015  CLINICAL DATA:  Overdose. EXAM: CT HEAD WITHOUT CONTRAST TECHNIQUE: Contiguous axial images were obtained from the base of the skull through the vertex without intravenous contrast. COMPARISON:  06/10/2015 FINDINGS: No acute intracranial abnormality. Specifically, no hemorrhage, hydrocephalus, mass lesion, acute infarction, or significant intracranial injury. No acute calvarial abnormality. Visualized paranasal sinuses and mastoids clear. Orbital soft tissues unremarkable. IMPRESSION: No acute intracranial abnormality. Electronically Signed   By: Charlett Nose M.D.   On: 08/02/2015 13:01     STUDIES:  EKG 6/19 >> SB, QTc 450 UDS 6/19 >>  Tylenol 6/19 >> less than 10 (negative) Salicylate 6/19 >> less than 4 (negative)  CULTURES:   ANTIBIOTICS:   SIGNIFICANT EVENTS: 6/19  Admit with overdose   LINES/TUBES: ETT 6/19 >>   DISCUSSION: 50 y/o F with PMH of bipolar disorder, anxiety, HTN, HLD and frequent overdoses who presented to [facility] on 6/19 as an overdose of unknown substances / quantity.  Rx'd with narcan gtt without response.  Intubated for airway protection.    ASSESSMENT / PLAN:  PULMONARY A: Acute Respiratory Failure - in setting of overdose, intubated for airway protection  P:   Now intubation PRVC 8 cc/kg  Wean PEEP / FiO2 for sats > 90% CXR now and in am  ABG one hour post intubation  Repeat ABG in am  Daily SBT / WUA   CARDIOVASCULAR A:  Bradycardia - suspect related to overdose  Hx HTN, murmur, HLD, Grade 1 Diastolic Dysfunction (ECHO 04/23/15) P:  Trend EKG, monitor QTc Avoid QT prolonging medications as able  Admit to ICU for hemodynamic monitoring  Hold home lisinopril 6/19, consider restart in AM if BP supports  RENAL A:   Mild Hyperkalemia  Elevated CK  P:   NS @ 100 ml/hr to ensure adequate hydration  Trend BMP / UOP  Replace electrolytes as indicated   GASTROINTESTINAL A:    Morbid Obesity  P:   Protonix for SUP  Begin TF per Nutrition   HEMATOLOGIC A:   No acute issues  P:  Heparin SQ for DVT prophylaxis  Trend CBC   INFECTIOUS A:   No evidence of acute infectious process - monitor for overdose associated concerns (aspiration etc) P:   Monitor fever curve / WBC Hold abx for now  ENDOCRINE A:   Hx Goiter, Hypothyroidism P:   Trend glucose on BMP  Assess TSH  Continue synthroid with IV dosing   NEUROLOGIC A:   Overdose - recurrent episodes with prior admission / intubations Hx Anxiety, Bipolar Disorder, Chronic Back Pain, Seizures P:   RASS goal: 0 to -1 Discontinue Narcan gtt given no response Propofol for sedation  Fentanyl gtt for pain Allow patient to wake Monitor for benzo withdrawal  Will need PSY evaluation prior to discharge.  Consider inpatient stabilization of medications for bipolar disorder / depression  Daily WUA  Continue Carbamazepine, assess level   Hold home baclofen, klonopin, cymbalta, percocet, neurontin & atarax  FAMILY  - Updates: No family at bedside 6/19.   - Inter-disciplinary family meet or Palliative Care meeting due by:   6/26  Canary Brim, NP-C [place] [facility] Pgr: [phone] or if no answer [phone] 08/02/2015, 2:27 PM  Attending Note:  50 year old female well known to our service who presents to the hospital with AMS after overdose on benzos/narcs/barbital in respiratory failure.  Patient did not respond to narcan.  Was not protecting her airway on exam and given the fact that she took Klonipin that is long acting decision was made to intubate.  Will place on propofol and fentanyl drip.  Will likely extubate in AM then will need psych consult and a suicide sitter.  The patient is critically ill with multiple organ systems failure and requires high complexity decision making for assessment and support, frequent evaluation and titration of therapies, application of advanced monitoring  technologies and extensive interpretation of multiple databases.   Critical Care Time devoted to patient care services described in this note is  35  Minutes. This time reflects time of care of this signee Dr Koren Bound. This critical care time does not reflect procedure time, or teaching time or supervisory time of PA/NP/Med student/Med Resident etc but could involve care discussion time.  Alyson Reedy, M.D. [facility]monary/Critical Care Medicine. Pager: [phone]. After hours pager: [phone].

## 2015-08-02 NOTE — ED Notes (Signed)
Pt had no response directly after getting 1mg  of Narcan.. Dr. Patria Maneampos came to bedside and was able to gain response to pain.  Pt sat up and moaned, but no actual words. Pt continues to have snoring respirations, HR in the 40s, and sats 100%/4L

## 2015-08-02 NOTE — Procedures (Signed)
Intubation Procedure Note [name] [id number] [birth year]  Procedure: Intubation Indications: Airway protection and maintenance  Procedure Details Consent: Unable to obtain consent because of emergent medical necessity. Time Out: Verified patient identification, verified procedure, site/side was marked, verified correct patient position, special equipment/implants available, medications/allergies/relevent history reviewed, required imaging and test results available.  Performed  Maximum sterile technique was used including gloves, hand hygiene and mask.  MAC    Evaluation Hemodynamic Status: BP stable throughout; O2 sats: stable throughout Patient's Current Condition: stable Complications: No apparent complications Patient did tolerate procedure well. Chest X-ray ordered to verify placement.  CXR: pending.   Koren BoundYACOUB,Carole Doner 08/02/2015

## 2015-08-02 NOTE — Progress Notes (Signed)
eLink Physician-Brief Progress Note Patient Name: Brittany [name] DOB: [birth year] MRN: [id number]   Date of Service  08/02/2015  HPI/Events of Note  CBG 65  eICU Interventions  1 amp D50 Start q4 checks Start dextrose drip is low again     Intervention Category Evaluation Type: Other  [name] 08/02/2015, 8:16 PM

## 2015-08-03 ENCOUNTER — Inpatient Hospital Stay (HOSPITAL_COMMUNITY): Admission: RE | Admit: 2015-08-03 | Payer: Self-pay | Source: Ambulatory Visit

## 2015-08-03 ENCOUNTER — Inpatient Hospital Stay (HOSPITAL_COMMUNITY): Payer: Medicaid Other

## 2015-08-03 DIAGNOSIS — T50901D Poisoning by unspecified drugs, medicaments and biological substances, accidental (unintentional), subsequent encounter: Secondary | ICD-10-CM

## 2015-08-03 LAB — BLOOD GAS, ARTERIAL
ACID-BASE EXCESS: 3.7 mmol/L — AB (ref 0.0–2.0)
BICARBONATE: 28.5 meq/L — AB (ref 20.0–24.0)
Drawn by: 10006
FIO2: 0.3
LHR: 16 {breaths}/min
O2 Saturation: 97.2 %
PATIENT TEMPERATURE: 99
PCO2 ART: 50 mmHg — AB (ref 35.0–45.0)
PEEP: 5 cmH2O
PO2 ART: 94.2 mmHg (ref 80.0–100.0)
TCO2: 30 mmol/L (ref 0–100)
VT: 450 mL
pH, Arterial: 7.376 (ref 7.350–7.450)

## 2015-08-03 LAB — CBC
HEMATOCRIT: 42.1 % (ref 36.0–46.0)
HEMOGLOBIN: 13.2 g/dL (ref 12.0–15.0)
MCH: 29.3 pg (ref 26.0–34.0)
MCHC: 31.4 g/dL (ref 30.0–36.0)
MCV: 93.6 fL (ref 78.0–100.0)
Platelets: 210 10*3/uL (ref 150–400)
RBC: 4.5 MIL/uL (ref 3.87–5.11)
RDW: 14.5 % (ref 11.5–15.5)
WBC: 8.9 10*3/uL (ref 4.0–10.5)

## 2015-08-03 LAB — GLUCOSE, CAPILLARY
GLUCOSE-CAPILLARY: 123 mg/dL — AB (ref 65–99)
GLUCOSE-CAPILLARY: 55 mg/dL — AB (ref 65–99)
GLUCOSE-CAPILLARY: 88 mg/dL (ref 65–99)
Glucose-Capillary: 74 mg/dL (ref 65–99)
Glucose-Capillary: 108 mg/dL — ABNORMAL HIGH (ref 65–99)
Glucose-Capillary: 84 mg/dL (ref 65–99)
Glucose-Capillary: 81 mg/dL (ref 65–99)

## 2015-08-03 LAB — BASIC METABOLIC PANEL
Anion gap: 8 (ref 5–15)
BUN: 7 mg/dL (ref 6–20)
CHLORIDE: 108 mmol/L (ref 101–111)
CO2: 26 mmol/L (ref 22–32)
CREATININE: 0.64 mg/dL (ref 0.44–1.00)
Calcium: 8.6 mg/dL — ABNORMAL LOW (ref 8.9–10.3)
GFR calc Af Amer: >60 mL/min (ref >60)
GFR calc non Af Amer: >60 mL/min (ref >60)
GLUCOSE: 112 mg/dL — AB (ref 65–99)
POTASSIUM: 3.6 mmol/L (ref 3.5–5.1)
Sodium: 142 mmol/L (ref 135–145)

## 2015-08-03 LAB — PHOSPHORUS: Phosphorus: 3.5 mg/dL (ref 2.5–4.6)

## 2015-08-03 LAB — MAGNESIUM: Magnesium: 1.9 mg/dL (ref 1.7–2.4)

## 2015-08-03 LAB — MYOGLOBIN, SERUM: Myoglobin: 150 ng/mL — ABNORMAL HIGH (ref 25–58)

## 2015-08-03 LAB — CK: Total CK: 358 U/L — ABNORMAL HIGH (ref 38–234)

## 2015-08-03 MED ORDER — LISINOPRIL 5 MG PO TABS
5.0000 mg | ORAL_TABLET | Freq: Every day | ORAL | Status: DC
  Filled 2015-08-03: qty 1

## 2015-08-03 MED ORDER — DEXTROSE-NACL 5-0.45 % IV SOLN
INTRAVENOUS | Status: DC
  Administered 2015-08-03 – 2015-08-04 (×3): via INTRAVENOUS

## 2015-08-03 MED ORDER — PROCHLORPERAZINE EDISYLATE 5 MG/ML IJ SOLN
5.0000 mg | Freq: Four times a day (QID) | INTRAMUSCULAR | Status: DC | PRN
  Filled 2015-08-03: qty 1

## 2015-08-03 MED ORDER — LORAZEPAM 2 MG/ML IJ SOLN: 1.0000 mg | INTRAMUSCULAR | Status: DC | PRN

## 2015-08-03 MED ORDER — DEXTROSE 50 % IV SOLN
INTRAVENOUS | Status: AC
  Administered 2015-08-03: 50 mL
  Filled 2015-08-03: qty 50

## 2015-08-03 NOTE — Progress Notes (Signed)
eLink Physician-Brief Progress Note Patient Name: [name] DOB: 08-09-1965 MRN: [id number]   Date of Service  08/03/2015  HPI/Events of Note  Persistent hypoglycemia.  eICU Interventions  Will add dextrose to IV fluid.     Intervention Category Major Interventions: Other:  Quorra Rosene 08/03/2015, 12:22 AM

## 2015-08-03 NOTE — Care Management Note (Signed)
Case Management Note  Patient Details  Name: [name] MRN: [id number] Date of Birth: 06-Oct-1965  Subjective/Objective:     Pt admitted for drug overdose - pt [facility] including [facility] health over last six months               Action/Plan:  CSW consulted.  Psych consulted.  CM will continue to monitor for disposition needs   Expected Discharge Date:   (unknown)               [facility]  [facility]  In-House Referral:  Clinical Social Work  Discharge planning Services  CM Consult  Post Acute Care Choice:  NA Choice offered to:  NA  DME Arranged:  N/A DME Agency:  NA  HH Arranged:  NA HH Agency:  NA  Status of Service:  In progress, will continue to monitor  Medicare Important Message Given:    Date Medicare IM Given:    Medicare IM give by:    Date Additional Medicare IM Given:    Additional Medicare Important Message give by:     If discussed at Long Length of Stay Meetings, dates discussed:    Additional Comments:  Cherylann ParrClaxton, Sharene Krikorian S, RN 08/03/2015, 10:37 AM

## 2015-08-03 NOTE — Progress Notes (Signed)
Patient boyfriend continues to call states that he needs information. Stated that patient is stable and explained the HIPPA law therefore  he would need to come to visit and establish a password.

## 2015-08-03 NOTE — Progress Notes (Signed)
eLink Physician-Brief Progress Note Patient Name: [name] DOB: [birth year] MRN: [id number]   Date of Service  08/03/2015  HPI/Events of Note  Patient c/o nausea. History of prolonged QTc.   eICU Interventions  Will order Compazine 5 mg IV Q 6 hours PRN.     Intervention Category Intermediate Interventions: Other:  Lenell AntuSommer,Tina Temme Eugene 08/03/2015, 8:56 PM

## 2015-08-03 NOTE — Progress Notes (Signed)
Nutrition Consult Note  Received consult for TF, which was discontinued due to extubation today. No nutrition intervention indicated at this time. Please re-consult RD as needed.  Joaquin CourtsKimberly Ayaana Biondo, RD, LDN, CNSC Pager [phone] After Hours Pager [phone]

## 2015-08-03 NOTE — Procedures (Signed)
Extubation Procedure Note  Patient Details:   Name: Brittany [name] DOB: 03/19/1965 MRN: [id number]   Airway Documentation:     Evaluation  O2 sats: stable throughout Complications: No apparent complications Patient did tolerate procedure well. Bilateral Breath Sounds: Diminished   Yes   Pt extubated per NP. Pt placed on 4L [place] with sats of 100%.  Pt tolerated well, RN at bedside.  RT will continue to monitor.  Ronny FlurryMorgan B Myrle Wanek 08/03/2015, 10:58 AM

## 2015-08-03 NOTE — Progress Notes (Signed)
Fentanyl gtt 125cc wasted in sink witnessed by Madaline SavageJosh Draper, RN

## 2015-08-03 NOTE — Progress Notes (Signed)
Psychiatry consult called for evaluation.  Appreciate assistance.  Concerned that the patient has had multiple admissions for overdose and inability to manage medications at home.  Currently, patient is unable to verbalize if this was an intentional overdose.  If not intentional, concerned she may need inpatient medical care to support her psychiatric disease.    Brittany BrimBrandi Freemon Binford, NP-C [place] [facility] Pgr: [phone] or if no answer [phone] 08/03/2015, 3:11 PM

## 2015-08-03 NOTE — Progress Notes (Signed)
PULMONARY / CRITICAL CARE MEDICINE   Name: Brittany [name] MRN: [id number] DOB: 12-28-1965    ADMISSION DATE:  08/02/2015 CONSULTATION DATE:  08/02/15  REFERRING MD:  Dr. Particia Nearing / EDP   CHIEF COMPLAINT:  AMS, Overdose   HISTORY OF PRESENT ILLNESS:  50 y/o F with PMH of HTN, HLD, Murmur, migraines, GERD, hypothyroidism, goiter, chronic bronchitis, seizures, bipolar disorder, chronic low back pain, anxiety and frequent admissions in the past six months for overdose who presented to [facility] on 6/19 after being found altered with snoring respirations.    The initial work up found the patient to be altered, bradycardic, minimal response to narcan but saturations of 100% on 4L. Initial labs Na 145, K 5.5, Cl 113, CO2 29, Sr Cr 0.74, AST 58, ALT 14, T. Bili 1.4, CK 538, WBC 7.6, Hgb 13.5 and platelets 292.  CXR showed cardiomegaly but no active disease.  The patient was treated with a narcan gtt with little improvement in mental status.  Exam showed little to no gag reflex with deep suction.  Home medication review showed long acting benzo's, percocet, atarax, cymbalta and carbamazepine.  Given concern for long acting medications and poor mental status, decision was made to intubate.  PCCM called for ICU admission.    SUBJECTIVE:   RN reports no acute events.  Awake on 30 mcg of propofol and fentanyl gtt at 50.  Follows commands.    VITAL SIGNS: BP 130/80 mmHg  Pulse 80  Temp(Src) 99 F (37.2 C) (Oral)  Resp 20  Ht  (1.626 m)  Wt 239 lb 13.8 oz (108.8 kg)  BMI 41.15 kg/m2  SpO2 100%  HEMODYNAMICS:    VENTILATOR SETTINGS: Vent Mode:  [-] PRVC FiO2 (%):  [30 %-100 %] 30 % Set Rate:  [16 bmp] 16 bmp Vt Set:  [450 mL] 450 mL PEEP:  [5 cmH20] 5 cmH20 Plateau Pressure:  [11 cmH20-20 cmH20] 11 cmH20  INTAKE / OUTPUT: I/O last 3 completed shifts: In: 2130.2 [I.V.:1647.5; NG/GT:482.7] Out: 875 [Urine:875]  PHYSICAL EXAMINATION: General:  Chronically ill appearing female on  vent Neuro:  Awakens to voice, follows commands, MAE HEENT:  MM pink/moist, no jvd  Cardiovascular:  s1s2 rrr, no m/r/g Lungs:  Non-labored, lungs bilaterally clear, faint yellow secretions from ETT Abdomen:  Obese/soft, bsx4 active  Musculoskeletal:  No acute deformities  Skin:  Warm/dry, no edema, dirty feet  LABS:  BMET  Recent Labs Lab 08/02/15 1216 08/03/15 0737  NA 145 142  K 5.5* 3.6  CL 113* 108  CO2 29 26  BUN 6 7  CREATININE 0.74 0.64  GLUCOSE 94 112*    Electrolytes  Recent Labs Lab 08/02/15 1216 08/03/15 0737  CALCIUM 8.6* 8.6*  MG  --  1.9  PHOS  --  3.5    CBC  Recent Labs Lab 08/02/15 1216 08/03/15 0737  WBC 7.6 8.9  HGB 13.5 13.2  HCT 42.5 42.1  PLT 292 210    Coag's No results for input(s): APTT, INR in the last 168 hours.  Sepsis Markers No results for input(s): LATICACIDVEN, PROCALCITON, O2SATVEN in the last 168 hours.  ABG  Recent Labs Lab 08/02/15 1536 08/03/15 0417  PHART 7.338* 7.376  PCO2ART 60.0* 50.0*  PO2ART 432.0* 94.2    Liver Enzymes  Recent Labs Lab 08/02/15 1216  AST 58*  ALT 14  ALKPHOS 60  BILITOT 1.4*  ALBUMIN 3.7    Cardiac Enzymes No results for input(s): TROPONINI, PROBNP in the last 168 hours.  Glucose  Recent Labs Lab 08/02/15 2052 08/02/15 2127 08/03/15 0013 08/03/15 0226 08/03/15 0351 08/03/15 0809  GLUCAP 65 86 55* 88 74 108*    Imaging Dg Chest 1 View  08/02/2015  CLINICAL DATA:  Drug overdose. EXAM: CHEST 1 VIEW COMPARISON:  None. FINDINGS: Mild cardiomegaly. No overt failure. No confluent opacities or effusions. No acute bony abnormality. IMPRESSION: Cardiomegaly.  No active disease. Electronically Signed   By: Charlett Nose M.D.   On: 08/02/2015 11:34   Ct Head Wo Contrast  08/02/2015  CLINICAL DATA:  Overdose. EXAM: CT HEAD WITHOUT CONTRAST TECHNIQUE: Contiguous axial images were obtained from the base of the skull through the vertex without intravenous contrast.  COMPARISON:  06/10/2015 FINDINGS: No acute intracranial abnormality. Specifically, no hemorrhage, hydrocephalus, mass lesion, acute infarction, or significant intracranial injury. No acute calvarial abnormality. Visualized paranasal sinuses and mastoids clear. Orbital soft tissues unremarkable. IMPRESSION: No acute intracranial abnormality. Electronically Signed   By: Charlett Nose M.D.   On: 08/02/2015 13:01   Dg Chest Port 1 View  08/03/2015  CLINICAL DATA:  Respiratory failure.  Shortness of breath. EXAM: PORTABLE CHEST 1 VIEW COMPARISON:  08/02/2015. FINDINGS: Endotracheal tube has been retracted, its tip is 2.8 cm above the carina. NG tube in stable position. Heart size stable. Low lung volumes with mild bibasilar atelectasis. No pleural effusion or pneumothorax . IMPRESSION: 1. Endotracheal tube is been retracted, its tip is 2.8 cm above the carina in good anatomic position. NG tube in stable position. 2. Low lung volumes with mild bibasilar atelectasis. Electronically Signed   By: Maisie Fus  Register   On: 08/03/2015 06:58   Dg Chest Port 1 View  08/02/2015  CLINICAL DATA:  Overdose, respiratory failure, status post intubation. EXAM: PORTABLE CHEST 1 VIEW COMPARISON:  08/02/2015 at 11:25 a.m. FINDINGS: Endotracheal tube tip is 10 mm above the carina. Consider retracting 1.5 cm as a safety margin. Low lung volumes are present, causing crowding of the pulmonary vasculature. Mild enlargement of the cardiopericardial silhouette, without edema. No discrete airspace opacity. IMPRESSION: 1. Endotracheal tube tip is 10 mm above the carina. Consider retracting 1.5 cm for a safety margin. 2. Low lung volumes are present, causing crowding of the pulmonary vasculature. 3. Mild enlargement of the cardiopericardial silhouette. Electronically Signed   By: Gaylyn Rong M.D.   On: 08/02/2015 14:42     STUDIES:  EKG 6/19 >> SB, QTc 450 UDS 6/19 >> positive for barbiturates, benzo's & opiates Tylenol 6/19 >> less  than 10 (negative) Salicylate 6/19 >> less than 4 (negative)  CULTURES:   ANTIBIOTICS:   SIGNIFICANT EVENTS: 6/19  Admit with overdose 6/20  Awake, weaning on PSV  LINES/TUBES: ETT 6/19 >>   DISCUSSION: 50 y/o F with PMH of bipolar disorder, anxiety, HTN, HLD and frequent overdoses who presented to [facility] on 6/19 as an overdose of unknown substances / quantity.  Rx'd with narcan gtt without response.  Intubated for airway protection.    ASSESSMENT / PLAN:  PULMONARY A: Acute Respiratory Failure - in setting of overdose, intubated for airway protection  P:   PRVC 8 cc/kg  Wean PEEP / FiO2 for sats > 90% SBT / WUA as tolerated Trend CXR ABG PRN Daily SBT / WUA   CARDIOVASCULAR A:  Bradycardia - suspect related to overdose  Hx HTN, murmur, HLD, Grade 1 Diastolic Dysfunction (ECHO 04/23/15) P:  Trend EKG, monitor QTc Avoid QT prolonging medications as able  ICU hemodynamic monitoring  Resume home lisinopril 6/20  RENAL A:   Mild Hyperkalemia  Elevated CK  P:   NS @ 100 ml/hr to ensure adequate hydration  Trend BMP / UOP  Replace electrolytes as indicated   GASTROINTESTINAL A:   Morbid Obesity  P:   Protonix for SUP  Begin TF per Nutrition   HEMATOLOGIC A:   No acute issues  P:  Heparin SQ for DVT prophylaxis  Trend CBC   INFECTIOUS A:   No evidence of acute infectious process - monitor for overdose associated concerns (aspiration etc) P:   Monitor fever curve / WBC Hold abx for now  ENDOCRINE A:   Hypoglycemia  Hx Goiter, Hypothyroidism - TSH 1.9 on admit P:   Trend glucose on BMP  Continue synthroid with IV dosing  D51/2 NS @ 125 ml/hr  NEUROLOGIC A:   Overdose - recurrent episodes with prior admission / intubations Hx Anxiety, Bipolar Disorder, Chronic Back Pain, Seizures P:   RASS goal: 0 to -1 Propofol for sedation  Fentanyl gtt for pain Allow patient to wake Monitor for benzo withdrawal  Will need PSY evaluation prior to  discharge.  Consider inpatient stabilization of medications for bipolar disorder / depression  Daily WUA  Continue Carbamazepine, level <2 on admit, doubt she was taking Hold home baclofen, klonopin, cymbalta, percocet, neurontin & atarax  FAMILY  - Updates: No family at bedside 6/20.    - Inter-disciplinary family meet or Palliative Care meeting due by:   6/26  Canary BrimBrandi Ollis, NP-C [facility] Pgr: [phone] or if no answer [phone] 08/03/2015, 9:50 AM  Attending Note:  50 year old female with PMH of multiple overdoses in the past presenting now another overdose with UDS positive for opiates, benzos and barbituates.  Patient was intubated and sedated overnight.  On exam this AM she is more awake and interactive.  Will begin weaning trials and extubate later today.  Patient is very confused and not answering questions appropriately post extubation.  Will check SLP and begin diet.  Will call psych and continue suicide sitter for now.  If doing ok by the afternoon [facility].  The patient is critically ill with multiple organ systems failure and requires high complexity decision making for assessment and support, frequent evaluation and titration of therapies, application of advanced monitoring technologies and extensive interpretation of multiple databases.   Critical Care Time devoted to patient care services described in this note is  35  Minutes. This time reflects time of care of this signee Dr Koren BoundWesam Khaled Herda. This critical care time does not reflect procedure time, or teaching time or supervisory time of PA/NP/Med student/Med Resident etc but could involve care discussion time.  Alyson ReedyWesam G. Maud Rubendall, M.D. [facility]. Pager: [phone]. After hours pager: [phone].

## 2015-08-03 NOTE — Progress Notes (Addendum)
Patient boyfriend came to visit. Patient remains confused however patient does know the visitors name. Patient became very emotional and crying one "Jonny Ruizjohn" came to visit. Patient continues to repeat that she is sorry, disoriented to place time and situation. "Jonny RuizJohn was ask not to upset the patient and that she had not been upset he until he came to visit.  Sitter remains at bedside

## 2015-08-04 ENCOUNTER — Inpatient Hospital Stay (HOSPITAL_COMMUNITY): Payer: Medicaid Other

## 2015-08-04 ENCOUNTER — Encounter (HOSPITAL_COMMUNITY): Payer: Self-pay | Admitting: General Practice

## 2015-08-04 DIAGNOSIS — T1491 Suicide attempt: Secondary | ICD-10-CM

## 2015-08-04 DIAGNOSIS — F603 Borderline personality disorder: Secondary | ICD-10-CM

## 2015-08-04 DIAGNOSIS — E876 Hypokalemia: Secondary | ICD-10-CM

## 2015-08-04 DIAGNOSIS — F332 Major depressive disorder, recurrent severe without psychotic features: Secondary | ICD-10-CM | POA: Insufficient documentation

## 2015-08-04 DIAGNOSIS — T50904D Poisoning by unspecified drugs, medicaments and biological substances, undetermined, subsequent encounter: Secondary | ICD-10-CM

## 2015-08-04 DIAGNOSIS — T48202A Poisoning by unspecified drugs acting on muscles, intentional self-harm, initial encounter: Secondary | ICD-10-CM

## 2015-08-04 LAB — BASIC METABOLIC PANEL
ANION GAP: 8 (ref 5–15)
BUN: <5 mg/dL — ABNORMAL LOW (ref 6–20)
CALCIUM: 9 mg/dL (ref 8.9–10.3)
CO2: 26 mmol/L (ref 22–32)
Chloride: 105 mmol/L (ref 101–111)
Creatinine, Ser: 0.63 mg/dL (ref 0.44–1.00)
GFR calc non Af Amer: >60 mL/min (ref >60)
Glucose, Bld: 101 mg/dL — ABNORMAL HIGH (ref 65–99)
Potassium: 3.3 mmol/L — ABNORMAL LOW (ref 3.5–5.1)
SODIUM: 139 mmol/L (ref 135–145)

## 2015-08-04 LAB — GLUCOSE, CAPILLARY
GLUCOSE-CAPILLARY: 111 mg/dL — AB (ref 65–99)
GLUCOSE-CAPILLARY: 113 mg/dL — AB (ref 65–99)
GLUCOSE-CAPILLARY: 78 mg/dL (ref 65–99)
Glucose-Capillary: 89 mg/dL (ref 65–99)

## 2015-08-04 LAB — CBC
HCT: 38.1 % (ref 36.0–46.0)
HEMOGLOBIN: 12.3 g/dL (ref 12.0–15.0)
MCH: 28.9 pg (ref 26.0–34.0)
MCHC: 32.3 g/dL (ref 30.0–36.0)
MCV: 89.4 fL (ref 78.0–100.0)
Platelets: 239 10*3/uL (ref 150–400)
RBC: 4.26 MIL/uL (ref 3.87–5.11)
RDW: 14.3 % (ref 11.5–15.5)
WBC: 7.1 10*3/uL (ref 4.0–10.5)

## 2015-08-04 MED ORDER — BUTALBITAL-APAP-CAFFEINE 50-325-40 MG PO TABS
1.0000 | ORAL_TABLET | Freq: Two times a day (BID) | ORAL | Status: DC | PRN
  Administered 2015-08-04 – 2015-08-05 (×3): 1 via ORAL
  Filled 2015-08-04 (×3): qty 1

## 2015-08-04 MED ORDER — TRAMADOL HCL 50 MG PO TABS
50.0000 mg | ORAL_TABLET | Freq: Four times a day (QID) | ORAL | Status: DC | PRN
  Administered 2015-08-04 – 2015-08-05 (×2): 50 mg via ORAL
  Filled 2015-08-04 (×3): qty 1

## 2015-08-04 MED ORDER — INSULIN ASPART 100 UNIT/ML ~~LOC~~ SOLN: 0.0000 [IU] | Freq: Every day | SUBCUTANEOUS | Status: DC

## 2015-08-04 MED ORDER — VITAMIN B-1 100 MG PO TABS
100.0000 mg | ORAL_TABLET | Freq: Every day | ORAL | Status: DC
  Administered 2015-08-04 – 2015-08-05 (×2): 100 mg via ORAL
  Filled 2015-08-04 (×2): qty 1

## 2015-08-04 MED ORDER — GABAPENTIN 400 MG PO CAPS
400.0000 mg | ORAL_CAPSULE | Freq: Three times a day (TID) | ORAL | Status: DC
  Administered 2015-08-04 – 2015-08-05 (×3): 400 mg via ORAL
  Filled 2015-08-04 (×3): qty 1

## 2015-08-04 MED ORDER — LEVOTHYROXINE SODIUM 100 MCG PO TABS
100.0000 ug | ORAL_TABLET | Freq: Every day | ORAL | Status: DC
  Administered 2015-08-05: 100 ug via ORAL
  Filled 2015-08-04: qty 1

## 2015-08-04 MED ORDER — LISINOPRIL 5 MG PO TABS
5.0000 mg | ORAL_TABLET | Freq: Every day | ORAL | Status: DC
  Administered 2015-08-04 – 2015-08-05 (×2): 5 mg via ORAL
  Filled 2015-08-04 (×2): qty 1

## 2015-08-04 MED ORDER — INSULIN ASPART 100 UNIT/ML ~~LOC~~ SOLN: 0.0000 [IU] | Freq: Three times a day (TID) | SUBCUTANEOUS | Status: DC

## 2015-08-04 MED ORDER — POTASSIUM CHLORIDE CRYS ER 20 MEQ PO TBCR
40.0000 meq | EXTENDED_RELEASE_TABLET | Freq: Once | ORAL | Status: AC
  Administered 2015-08-04: 40 meq via ORAL
  Filled 2015-08-04: qty 2

## 2015-08-04 MED ORDER — HYDROXYZINE HCL 25 MG PO TABS
50.0000 mg | ORAL_TABLET | Freq: Three times a day (TID) | ORAL | Status: DC | PRN
  Administered 2015-08-04: 50 mg via ORAL
  Filled 2015-08-04: qty 2
  Filled 2015-08-04: qty 1

## 2015-08-04 MED ORDER — ACETAMINOPHEN 325 MG PO TABS: 650.0000 mg | ORAL_TABLET | ORAL | Status: DC | PRN

## 2015-08-04 NOTE — Progress Notes (Signed)
[facility] TEAM 1 - Stepdown/ICU TEAM  [name]  ZOX:[id number] DOB: 27-Sep-1965 DOA: 08/02/2015 PCP: [name], [name], MD    Brief Narrative:  50 y/o F with Hx of HTN, HLD, Murmur, migraines, GERD, hypothyroidism, goiter, chronic bronchitis, seizures, bipolar disorder, chronic low back pain, anxiety and frequent admissions over the past six months for overdose who [facility] after being found altered with snoring respirations.   The initial work up found the patient to be altered and bradycardic w/ minimal response to narcan but saturations of 100% on 4L. CXR showed cardiomegaly but no active disease. The patient was treated with a narcan gtt with little improvement in mental status. Exam showed little to no gag reflex with deep suction. Home medication review showed long acting benzo's, percocet, atarax, cymbalta and carbamazepine. Given concern for long acting medications and poor mental status, decision was made to intubate.    Significant Events: 6/19 Admit with overdose 6/20 Awake, weaning on PSV  Subjective: Awake and alert.  Denies cp, sob, or abdom pain.  Tells me she "took a few too many of her medications" because she was upset her dog was lost.     Assessment & Plan:  Acute Respiratory Failure - in setting of overdose - resolved intubated for airway protection - resolved w/ sats now 96% on RA  Overdose recurrent episodes with prior admission / intubations - cont sitter and suicide precautions until Psych has seen - suspect pt will need inpt Psych admit/tx  Bradycardia suspect related to overdose - resolved   HTN  BP not at goal - adjust tx and follow  HLD  Grade 1 Diastolic Dysfunction Per TTE 04/23/15 - no evidence of volume overload at present  Hypokalemia Replace and follow   Elevated CK  Resolved   Morbid Obesity - Body mass index is 39.79 kg/(m^2).  Hx Goiter - Hypothyroidism TSH 1.9 - cont synthroid    Hx Anxiety, Bipolar  Disorder  Chronic Back Pain  Chronic trigeminal neuralgia  Seizures  DVT prophylaxis: SQ heparin  Code Status: FULL CODE Family Communication: no family present at time of exam  Disposition Plan: transfer to medical bed w/ sitter   Consultants:  PCCM Psych  Antimicrobials:  none  Objective: Blood pressure 140/90, pulse 80, temperature 99.1 F (37.3 C), temperature source Oral, resp. rate 18, height 5\' 4"  (1.626 m), weight 105.2 kg (231 lb 14.8 oz), SpO2 96 %.  Intake/Output Summary (Last 24 hours) at 08/04/15 0843 Last data filed at 08/04/15 0700  Gross per 24 hour  Intake 2925.18 ml  Output   2625 ml  Net 300.18 ml   Filed Weights   08/02/15 1430 08/03/15 0500 08/04/15 0351  Weight: 104 kg (229 lb 4.5 oz) 108.8 kg (239 lb 13.8 oz) 105.2 kg (231 lb 14.8 oz)    Examination: General: No acute respiratory distress Lungs: Clear to auscultation bilaterally without wheezes or crackles Cardiovascular: Regular rate and rhythm without murmur gallop or rub normal S1 and S2 Abdomen: Nontender, nondistended, soft, bowel sounds positive, no rebound, no ascites, no appreciable mass Extremities: No significant cyanosis, clubbing, or edema bilateral lower extremities  CBC:  Recent Labs Lab 08/02/15 1216 08/03/15 0737 08/04/15 0500  WBC 7.6 8.9 7.1  NEUTROABS 4.5  --   --   HGB 13.5 13.2 12.3  HCT 42.5 42.1 38.1  MCV 91.8 93.6 89.4  PLT 292 210 239   Basic Metabolic Panel:  Recent Labs Lab 08/02/15 1216 08/03/15 0737 08/04/15  0500  NA 145 142 139  K 5.5* 3.6 3.3*  CL 113* 108 105  CO2 GLUCOSE 94 112* 101*  BUN 6 7 <5*  CREATININE 0.74 0.64 0.63  CALCIUM 8.6* 8.6* 9.0  MG  --  1.9  --   PHOS  --  3.5  --    GFR: Estimated Creatinine Clearance: 101.7 mL/min (by C-G formula based on Cr of 0.63).  Liver Function Tests:  Recent Labs Lab 08/02/15 1216  AST 58*  ALT 14  ALKPHOS 60  BILITOT 1.4*  PROT 6.2*  ALBUMIN 3.7   Cardiac  Enzymes:  Recent Labs Lab 08/02/15 1216 08/03/15 0737  CKTOTAL 538* 358*    HbA1C: HGB A1C MFR BLD  Date/Time Value Ref Range Status  05/01/2015 07:39 AM 5.1 4.8 - 5.6 % Final    Comment:    (NOTE)         Pre-diabetes: 5.7 - 6.4         Diabetes: >6.4         Glycemic control for adults with diabetes: <7.0     CBG:  Recent Labs Lab 08/03/15 1520 08/03/15 1949 08/04/15 0003 08/04/15 0347 08/04/15 0733  GLUCAP 81 123* 78 89 113*    Recent Results (from the past 240 hour(s))  MRSA PCR Screening     Status: None   Collection Time: 08/02/15  4:47 PM  Result Value Ref Range Status   MRSA by PCR NEGATIVE NEGATIVE Final    Comment:        The GeneXpert MRSA Assay (FDA approved for NASAL specimens only), is one component of a comprehensive MRSA colonization surveillance program. It is not intended to diagnose MRSA infection nor to guide or monitor treatment for MRSA infections.      Scheduled Meds: . antiseptic oral rinse  7 mL Mouth Rinse q12n4p  . carbamazepine  300 mg Oral Daily  . chlorhexidine  15 mL Mouth Rinse BID  . feeding supplement (PRO-STAT SUGAR FREE 64)  30 mL Per Tube BID  . feeding supplement (VITAL HIGH PROTEIN)  1,000 mL Per Tube Q24H  . fentaNYL (SUBLIMAZE) injection  50 mcg Intravenous Once  . heparin  5,000 Units Subcutaneous Q8H  . levothyroxine  50 mcg Intravenous Daily   Continuous Infusions: . dextrose 5 % and 0.45% NaCl 125 mL/hr at 08/03/15 2332  . fentaNYL infusion INTRAVENOUS Stopped (08/03/15 1045)  . propofol (DIPRIVAN) infusion Stopped (08/03/15 1045)     LOS: 2 days   Time spent: 35 minutes   Lonia Blood, MD [facility] Office  [phone] Pager - Text Page per Amion as per below:  On-Call/Text Page:      [email or web address]      password [email or web address]  If 7PM-7AM, please contact night-coverage [email or web address] Password [facility] 08/04/2015, 8:43 AM

## 2015-08-04 NOTE — Progress Notes (Signed)
Telephone report called to 6E. Patient transported via wheelchair, with sitter and 1 bag of belongings.

## 2015-08-04 NOTE — Progress Notes (Signed)
New Admission Note:  Transfer 45M  Arrival Method: wheelchair Mental Orientation: a/o x4 Telemetry: not placed Assessment: Completed Skin: clean dry intact. Pink foam on sacrum IV: L EJ SL Pain: none Tubes: none Safety Measures: Safety Fall Prevention Plan has been given, discussed and signed Admission: Completed Unit Orientation: Patient has been orientated to the room, unit and staff.  Family: fiance at bedside Pt placed on suicide prtecautions  Orders have been reviewed and implemented. Will continue to monitor the patient. Call light has been placed within reach and bed alarm has been activated.   Janeann ForehandLuke Noga Fogg BSN, RN

## 2015-08-04 NOTE — Progress Notes (Signed)
CSW has staffed with Psych CSW who will continue to follow and establish psych disposition pending psychiatric evaluation and recommendation.     Lance MussAshley Gardner,MSW, LCSW [facility] ED/35M Clinical Social Worker [phone]

## 2015-08-04 NOTE — Consult Note (Signed)
[facility] Consult   Reason for Consult:  overdose Referring Physician:  Dr. Thereasa Solo Patient Identification: Brittany [name] MRN:  [id number] Principal Diagnosis: Overdose Diagnosis:   Patient Active Problem List   Diagnosis Date Noted  . Acute respiratory failure with hypoxemia ([place]) [J96.01]   . Bipolar I disorder, most recent episode mixed ([place]) [F31.60] 06/14/2015  . Essential hypertension [I10]   . Sinus bradycardia [R00.1] 06/10/2015  . Prolonged QT interval [I45.81] 05/03/2015  . Morbid obesity due to excess calories ([place]) [E66.01]   . GERD (gastroesophageal reflux disease) [K21.9]   . Migraine without aura and without status migrainosus, not intractable [G43.009]   . Overdose [T50.901A] 03/23/2015  . History of hepatitis C virus infection [Z86.19] 02/26/2015  . 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 Right facial pain [R51] 01/05/2015  . Polypharmacy [Z79.899] 01/18/2012    Class: Chronic  . Borderline personality disorder [F60.3] 01/18/2012  . Hypothyroidism [E03.9] 09/16/2011    Total Time spent with patient: 1 hour  Subjective:   Brittany [name] is a 50 y.o. female patient admitted with AMS.  HPI:  Brittany [name] is a 50 years old female admitted to [facility] with altered mental status, disorientation secondary to excessive oral intake of prescription medication baclofen. Patient regained her normal mental status and not disoriented any longer. Patient is oriented to current year, month, date, day and place and person. Patient is also oriented to current situation. Patient appeared sitting in a recliner chair next to the bed. Patient is calm, cooperative, pleasant and able to walk inside her room without any assistance. Patient with PMH of HTN, HLD, Murmur, migraines, GERD, hypothyroidism, goiter, chronic bronchitis, seizures, bipolar disorder, chronic low back pain, anxiety and frequent admissions in the past six months for overdose. Patient lives  with her fianc who is at bedside and able to contribute for this evaluation with the patient consent. Patient denies current symptoms of depression, mania, psychosis and denied suicidal/homicidal ideation, intention or plans. Patient stated that she overdosed on her muscle relaxant because she was upset due to her dog sneaked out of the house and has to search at least 25 minutes before neighbor found her and brought him back. Reportedly she took over 10-20 tablets over the period of 1-2 days but denied intention to end her life. Patient was known to this provider from her previous psychiatric consultation and evaluation in the hospital for altered mental status and multiple drug overdose. Review of medical records indicated patient was recently admitted to [facility] this [facility] psychiatric facility.   PAST MEDICAL HISTORY : She  has a past medical history of Hypertension; Bipolar 1 disorder ([place]); Anxiety; Bulging lumbar disc; Ruptured intervertebral disc; Head injury; Short-term memory loss; Depression; Hypothyroidism; Goiter; Facial pain (11/2013); Facial nerve injury; Hyperlipidemia; Heart murmur; Chronic bronchitis ([place]); GERD (gastroesophageal reflux disease); Migraine; Seizures ([place]); and Chronic lower back pain.  PAST SURGICAL HISTORY: She  has past surgical history that includes Tonsillectomy; Abdominal hysterectomy; Cesarean section (2004); and Eye surgery (Right, 11/2013).  Past Psychiatric History: She has been diagnosed with bipolar disorder and borderline personality disorder and had multiple psych admission both at behavioral [facility] and her last admission was May 2017 at [facility].  Risk to Self:   Risk to Others:   Prior Inpatient Therapy:   Prior Outpatient Therapy:    Past Medical History:  Past Medical History  Diagnosis Date  . Hypertension   . Bipolar 1 disorder ([place])   .  Anxiety   . Bulging lumbar disc   . Ruptured intervertebral  disc   . Head injury   . Short-term memory loss   . Depression   . Hypothyroidism   . Goiter   . Facial pain 11/2013    walking dog when she tripped and "faceplanted" landing on her face.   . Facial nerve injury   . Hyperlipidemia   . Heart murmur   . Chronic bronchitis ([place])   . GERD (gastroesophageal reflux disease)   . Migraine     "weekly" (03/23/2015)  . Seizures ([place])     "I've had a few; had a grand mal when I took Neurotin; still take Neurotin" (03/23/2015)  . Chronic lower back pain     Past Surgical History  Procedure Laterality Date  . Tonsillectomy    . Abdominal hysterectomy    . Cesarean section  2004  . Eye surgery Right 11/2013    emergent lateral canthotomy due to proptosis  /notes 11/30/2013   Family History:  Family History  Problem Relation Age of Onset  . Migraines Mother   . Migraines Father   . Other Father   . Migraines Sister    Family Psychiatric  History: Patient has no Family history of psychiatric illness and was reportedly had a daughter who was adopted out of the state. Social History:  History  Alcohol Use  . Yes    Comment: 03/23/2015 "might drink at Christmastime"     History  Drug Use No    Comment: 03/23/2015 ""haven't smoked marijuana for years"    Social History   Social History  . Marital Status: Married    Spouse Name: N/A  . Number of Children: 1  . Years of Education: 16   Occupational History  . Unemployed    Social History Main Topics  . Smoking status: Former Smoker    Types: Cigarettes  . Smokeless tobacco: Never Used     Comment: "smoked cigarettes when I was 50 years old"  . Alcohol Use: Yes     Comment: 03/23/2015 "might drink at Christmastime"  . Drug Use: No     Comment: 03/23/2015 ""haven't smoked marijuana for years"  . Sexual Activity: Yes    Birth Control/ Protection: Condom   Other Topics Concern  . None   Social History Narrative   Lives at home with her fiancee.   Right-handed.   No caffeine use.    Additional Social History:    Allergies:   Allergies  Allergen Reactions  . Toradol [Ketorolac Tromethamine] Swelling  . Suboxone [Buprenorphine Hcl-Naloxone Hcl] Other (See Comments)    cellulitis   . Trazodone And Nefazodone Other (See Comments)    Bladder infection    Labs:  Results for orders placed or performed during the hospital encounter of 08/02/15 (from the past 48 hour(s))  Acetaminophen level     Status: Abnormal   Collection Time: 08/02/15 12:16 PM  Result Value Ref Range   Acetaminophen (Tylenol), Serum <10 (L) 10 - 30 ug/mL    Comment:        THERAPEUTIC CONCENTRATIONS VARY SIGNIFICANTLY. A RANGE OF 10-30 ug/mL MAY BE AN EFFECTIVE CONCENTRATION FOR MANY PATIENTS. HOWEVER, SOME ARE BEST TREATED AT CONCENTRATIONS OUTSIDE THIS RANGE. ACETAMINOPHEN CONCENTRATIONS >150 ug/mL AT 4 HOURS AFTER INGESTION AND >50 ug/mL AT 12 HOURS AFTER INGESTION ARE OFTEN ASSOCIATED WITH TOXIC REACTIONS.   Comprehensive metabolic panel     Status: Abnormal   Collection Time: 08/02/15 12:16 PM  Result Value Ref Range   Sodium 145 135 - 145 mmol/L   Potassium 5.5 (H) 3.5 - 5.1 mmol/L    Comment: HEMOLYSIS AT THIS LEVEL MAY AFFECT RESULT   Chloride 113 (H) 101 - 111 mmol/L   CO2 29 22 - 32 mmol/L   Glucose, Bld 94 65 - 99 mg/dL   BUN 6 6 - 20 mg/dL   Creatinine, Ser 0.74 0.44 - 1.00 mg/dL   Calcium 8.6 (L) 8.9 - 10.3 mg/dL   Total Protein 6.2 (L) 6.5 - 8.1 g/dL   Albumin 3.7 3.5 - 5.0 g/dL   AST 58 (H) 15 - 41 U/L   ALT 14 14 - 54 U/L   Alkaline Phosphatase 60 38 - 126 U/L   Total Bilirubin 1.4 (H) 0.3 - 1.2 mg/dL   GFR calc non Af Amer >60 >60 mL/min   GFR calc Af Amer >60 >60 mL/min    Comment: (NOTE) The eGFR has been calculated using the CKD EPI equation. This calculation has not been validated in all clinical situations. eGFR's persistently <60 mL/min signify possible Chronic Kidney Disease.    Anion gap 3 (L) 5 - 15  CBC WITH DIFFERENTIAL     Status: None    Collection Time: 08/02/15 12:16 PM  Result Value Ref Range   WBC 7.6 4.0 - 10.5 K/uL    Comment: WHITE COUNT CONFIRMED ON SMEAR   RBC 4.63 3.87 - 5.11 MIL/uL   Hemoglobin 13.5 12.0 - 15.0 g/dL   HCT 42.5 36.0 - 46.0 %   MCV 91.8 78.0 - 100.0 fL   MCH 29.2 26.0 - 34.0 pg   MCHC 31.8 30.0 - 36.0 g/dL   RDW 14.4 11.5 - 15.5 %   Platelets 292 150 - 400 K/uL   Neutrophils Relative % 60 %   Lymphocytes Relative 28 %   Monocytes Relative 10 %   Eosinophils Relative 2 %   Basophils Relative 0 %   Neutro Abs 4.5 1.7 - 7.7 K/uL   Lymphs Abs 2.1 0.7 - 4.0 K/uL   Monocytes Absolute 0.8 0.1 - 1.0 K/uL   Eosinophils Absolute 0.2 0.0 - 0.7 K/uL   Basophils Absolute 0.0 0.0 - 0.1 K/uL   Smear Review MORPHOLOGY UNREMARKABLE   Ethanol     Status: None   Collection Time: 08/02/15 12:16 PM  Result Value Ref Range   Alcohol, Ethyl (B) <5 <5 mg/dL    Comment:        LOWEST DETECTABLE LIMIT FOR SERUM ALCOHOL IS 5 mg/dL FOR MEDICAL PURPOSES ONLY   Salicylate level     Status: None   Collection Time: 08/02/15 12:16 PM  Result Value Ref Range   Salicylate Lvl <1.2 2.8 - 30.0 mg/dL  CK     Status: Abnormal   Collection Time: 08/02/15 12:16 PM  Result Value Ref Range   Total CK 538 (H) 38 - 234 U/L    Comment: HEMOLYSIS AT THIS LEVEL MAY AFFECT RESULT  Myoglobin, serum     Status: Abnormal   Collection Time: 08/02/15 12:16 PM  Result Value Ref Range   Myoglobin 150 (H) 25 - 58 ng/mL    Comment: (NOTE) Performed At: [facility] [place], [place] [id number] Lindon Romp MD NO:[id number]   Urine rapid drug screen (hosp performed)not at [facility]     Status: Abnormal   Collection Time: 08/02/15 12:29 PM  Result Value Ref Range   Opiates POSITIVE (A) NONE DETECTED  Cocaine NONE DETECTED NONE DETECTED   Benzodiazepines POSITIVE (A) NONE DETECTED   Amphetamines NONE DETECTED NONE DETECTED   Tetrahydrocannabinol NONE DETECTED NONE DETECTED   Barbiturates POSITIVE (A) NONE  DETECTED    Comment:        DRUG SCREEN FOR MEDICAL PURPOSES ONLY.  IF CONFIRMATION IS NEEDED FOR ANY PURPOSE, NOTIFY LAB WITHIN 5 DAYS.        LOWEST DETECTABLE LIMITS FOR URINE DRUG SCREEN Drug Class       Cutoff (ng/mL) Amphetamine      1000 Barbiturate      200 Benzodiazepine   269 Tricyclics       485 Opiates          300 Cocaine          300 THC              50   Urinalysis, Routine w reflex microscopic (not at [facility])     Status: None   Collection Time: 08/02/15 12:29 PM  Result Value Ref Range   Color, Urine YELLOW YELLOW   APPearance CLEAR CLEAR   Specific Gravity, Urine 1.015 1.005 - 1.030   pH 7.0 5.0 - 8.0   Glucose, UA NEGATIVE NEGATIVE mg/dL   Hgb urine dipstick NEGATIVE NEGATIVE   Bilirubin Urine NEGATIVE NEGATIVE   Ketones, ur NEGATIVE NEGATIVE mg/dL   Protein, ur NEGATIVE NEGATIVE mg/dL   Nitrite NEGATIVE NEGATIVE   Leukocytes, UA NEGATIVE NEGATIVE    Comment: MICROSCOPIC NOT DONE ON URINES WITH NEGATIVE PROTEIN, BLOOD, LEUKOCYTES, NITRITE, OR GLUCOSE <1000 mg/dL.  Pregnancy, urine     Status: None   Collection Time: 08/02/15 12:29 PM  Result Value Ref Range   Preg Test, Ur NEGATIVE NEGATIVE    Comment:        THE SENSITIVITY OF THIS METHODOLOGY IS >20 mIU/mL.   I-Stat arterial blood gas, ED     Status: Abnormal   Collection Time: 08/02/15  3:36 PM  Result Value Ref Range   pH, Arterial 7.338 (L) 7.350 - 7.450   pCO2 arterial 60.0 (HH) 35.0 - 45.0 mmHg   pO2, Arterial 432.0 (H) 80.0 - 100.0 mmHg   Bicarbonate 32.2 (H) 20.0 - 24.0 mEq/L   TCO2 34 0 - 100 mmol/L   O2 Saturation 100.0 %   Acid-Base Excess 5.0 (H) 0.0 - 2.0 mmol/L   Patient temperature 98.6 F    Collection site RADIAL, ALLEN'S TEST ACCEPTABLE    Drawn by Operator    Sample type ARTERIAL    Comment NOTIFIED PHYSICIAN   MRSA PCR Screening     Status: None   Collection Time: 08/02/15  4:47 PM  Result Value Ref Range   MRSA by PCR NEGATIVE NEGATIVE    Comment:        The GeneXpert  MRSA Assay (FDA approved for NASAL specimens only), is one component of a comprehensive MRSA colonization surveillance program. It is not intended to diagnose MRSA infection nor to guide or monitor treatment for MRSA infections.   TSH     Status: None   Collection Time: 08/02/15  5:03 PM  Result Value Ref Range   TSH 1.903 0.350 - 4.500 uIU/mL  Carbamazepine level, total     Status: Abnormal   Collection Time: 08/02/15  5:03 PM  Result Value Ref Range   Carbamazepine Lvl <2.0 (L) 4.0 - 12.0 ug/mL  Triglycerides     Status: Abnormal   Collection Time: 08/02/15  5:16 PM  Result Value  Ref Range   Triglycerides 386 (H) <150 mg/dL  Glucose, capillary     Status: None   Collection Time: 08/02/15  5:18 PM  Result Value Ref Range   Glucose-Capillary 83 65 - 99 mg/dL  Glucose, capillary     Status: None   Collection Time: 08/02/15  8:03 PM  Result Value Ref Range   Glucose-Capillary 65 65 - 99 mg/dL  Glucose, capillary     Status: None   Collection Time: 08/02/15  8:52 PM  Result Value Ref Range   Glucose-Capillary 65 65 - 99 mg/dL  Glucose, capillary     Status: None   Collection Time: 08/02/15  9:27 PM  Result Value Ref Range   Glucose-Capillary 86 65 - 99 mg/dL  Glucose, capillary     Status: Abnormal   Collection Time: 08/03/15 12:13 AM  Result Value Ref Range   Glucose-Capillary 55 (L) 65 - 99 mg/dL  Glucose, capillary     Status: None   Collection Time: 08/03/15  2:26 AM  Result Value Ref Range   Glucose-Capillary 88 65 - 99 mg/dL  Glucose, capillary     Status: None   Collection Time: 08/03/15  3:51 AM  Result Value Ref Range   Glucose-Capillary 74 65 - 99 mg/dL  Blood gas, arterial     Status: Abnormal   Collection Time: 08/03/15  4:17 AM  Result Value Ref Range   FIO2 0.30    Delivery systems VENTILATOR    Mode PRESSURE REGULATED VOLUME CONTROL    VT 450 mL   LHR 16 resp/min   Peep/cpap 5.0 cm H20   pH, Arterial 7.376 7.350 - 7.450   pCO2 arterial 50.0 (H)  35.0 - 45.0 mmHg   pO2, Arterial 94.2 80.0 - 100.0 mmHg   Bicarbonate 28.5 (H) 20.0 - 24.0 mEq/L   TCO2 30.0 0 - 100 mmol/L   Acid-Base Excess 3.7 (H) 0.0 - 2.0 mmol/L   O2 Saturation 97.2 %   Patient temperature 99.0    Collection site RIGHT BRACHIAL    Drawn by 10006    Sample type ARTERIAL    Allens test (pass/fail) PASS PASS  CBC     Status: None   Collection Time: 08/03/15  7:37 AM  Result Value Ref Range   WBC 8.9 4.0 - 10.5 K/uL   RBC 4.50 3.87 - 5.11 MIL/uL   Hemoglobin 13.2 12.0 - 15.0 g/dL   HCT 42.1 36.0 - 46.0 %   MCV 93.6 78.0 - 100.0 fL   MCH 29.3 26.0 - 34.0 pg   MCHC 31.4 30.0 - 36.0 g/dL   RDW 14.5 11.5 - 15.5 %   Platelets 210 150 - 400 K/uL  Basic metabolic panel     Status: Abnormal   Collection Time: 08/03/15  7:37 AM  Result Value Ref Range   Sodium 142 135 - 145 mmol/L   Potassium 3.6 3.5 - 5.1 mmol/L   Chloride 108 101 - 111 mmol/L   CO2 26 22 - 32 mmol/L   Glucose, Bld 112 (H) 65 - 99 mg/dL   BUN 7 6 - 20 mg/dL   Creatinine, Ser 0.64 0.44 - 1.00 mg/dL   Calcium 8.6 (L) 8.9 - 10.3 mg/dL   GFR calc non Af Amer >60 >60 mL/min   GFR calc Af Amer >60 >60 mL/min    Comment: (NOTE) The eGFR has been calculated using the CKD EPI equation. This calculation has not been validated in all clinical situations. eGFR's persistently <  60 mL/min signify possible Chronic Kidney Disease.    Anion gap 8 5 - 15  Magnesium     Status: None   Collection Time: 08/03/15  7:37 AM  Result Value Ref Range   Magnesium 1.9 1.7 - 2.4 mg/dL  Phosphorus     Status: None   Collection Time: 08/03/15  7:37 AM  Result Value Ref Range   Phosphorus 3.5 2.5 - 4.6 mg/dL  CK     Status: Abnormal   Collection Time: 08/03/15  7:37 AM  Result Value Ref Range   Total CK 358 (H) 38 - 234 U/L  Glucose, capillary     Status: Abnormal   Collection Time: 08/03/15  8:09 AM  Result Value Ref Range   Glucose-Capillary 108 (H) 65 - 99 mg/dL  Glucose, capillary     Status: None    Collection Time: 08/03/15 11:47 AM  Result Value Ref Range   Glucose-Capillary 84 65 - 99 mg/dL  Glucose, capillary     Status: None   Collection Time: 08/03/15  3:20 PM  Result Value Ref Range   Glucose-Capillary 81 65 - 99 mg/dL  Glucose, capillary     Status: Abnormal   Collection Time: 08/03/15  7:49 PM  Result Value Ref Range   Glucose-Capillary 123 (H) 65 - 99 mg/dL   Comment 1 Notify RN   Glucose, capillary     Status: None   Collection Time: 08/04/15 12:03 AM  Result Value Ref Range   Glucose-Capillary 78 65 - 99 mg/dL  Glucose, capillary     Status: None   Collection Time: 08/04/15  3:47 AM  Result Value Ref Range   Glucose-Capillary 89 65 - 99 mg/dL   Comment 1 Notify RN   Basic metabolic panel     Status: Abnormal   Collection Time: 08/04/15  5:00 AM  Result Value Ref Range   Sodium 139 135 - 145 mmol/L   Potassium 3.3 (L) 3.5 - 5.1 mmol/L   Chloride 105 101 - 111 mmol/L   CO2 26 22 - 32 mmol/L   Glucose, Bld 101 (H) 65 - 99 mg/dL   BUN <5 (L) 6 - 20 mg/dL   Creatinine, Ser 0.63 0.44 - 1.00 mg/dL   Calcium 9.0 8.9 - 10.3 mg/dL   GFR calc non Af Amer >60 >60 mL/min   GFR calc Af Amer >60 >60 mL/min    Comment: (NOTE) The eGFR has been calculated using the CKD EPI equation. This calculation has not been validated in all clinical situations. eGFR's persistently <60 mL/min signify possible Chronic Kidney Disease.    Anion gap 8 5 - 15  CBC     Status: None   Collection Time: 08/04/15  5:00 AM  Result Value Ref Range   WBC 7.1 4.0 - 10.5 K/uL   RBC 4.26 3.87 - 5.11 MIL/uL   Hemoglobin 12.3 12.0 - 15.0 g/dL   HCT 38.1 36.0 - 46.0 %   MCV 89.4 78.0 - 100.0 fL   MCH 28.9 26.0 - 34.0 pg   MCHC 32.3 30.0 - 36.0 g/dL   RDW 14.3 11.5 - 15.5 %   Platelets 239 150 - 400 K/uL  Glucose, capillary     Status: Abnormal   Collection Time: 08/04/15  7:33 AM  Result Value Ref Range   Glucose-Capillary 113 (H) 65 - 99 mg/dL    Current Facility-Administered Medications   Medication Dose Route Frequency Provider Last Rate Last Dose  . acetaminophen (TYLENOL) tablet  650 mg  650 mg Per NG tube Q4H PRN Donita Brooks, NP      . albuterol (PROVENTIL) (2.5 MG/3ML) 0.083% nebulizer solution 2.5 mg  2.5 mg Nebulization Q2H PRN Donita Brooks, NP      . antiseptic oral rinse (CPC / CETYLPYRIDINIUM CHLORIDE 0.05%) solution 7 mL  7 mL Mouth Rinse q12n4p Rush Farmer, MD   7 mL at 08/03/15 1200  . bisacodyl (DULCOLAX) suppository 10 mg  10 mg Rectal Daily PRN Donita Brooks, NP      . carbamazepine (TEGRETOL) tablet 300 mg  300 mg Oral Daily Donita Brooks, NP   300 mg at 08/03/15 1451  . chlorhexidine (PERIDEX) 0.12 % solution 15 mL  15 mL Mouth Rinse BID Rush Farmer, MD   15 mL at 08/03/15 2123  . dextrose 5 %-0.45 % sodium chloride infusion   Intravenous Continuous Chesley Mires, MD 125 mL/hr at 08/04/15 0745    . feeding supplement (PRO-STAT SUGAR FREE 64) liquid 30 mL  30 mL Per Tube BID Donita Brooks, NP   30 mL at 08/02/15 2222  . feeding supplement (VITAL HIGH PROTEIN) liquid 1,000 mL  1,000 mL Per Tube Q24H Donita Brooks, NP      . fentaNYL (SUBLIMAZE) 2,500 mcg in sodium chloride 0.9 % 250 mL (10 mcg/mL) infusion  25-400 mcg/hr Intravenous Continuous Donita Brooks, NP   Stopped at 08/03/15 1045  . fentaNYL (SUBLIMAZE) bolus via infusion 50 mcg  50 mcg Intravenous Q1H PRN Donita Brooks, NP   50 mcg at 08/03/15 0404  . fentaNYL (SUBLIMAZE) injection 50 mcg  50 mcg Intravenous Once Donita Brooks, NP      . heparin injection 5,000 Units  5,000 Units Subcutaneous Q8H Donita Brooks, NP   5,000 Units at 08/04/15 0522  . levothyroxine (SYNTHROID, LEVOTHROID) injection 50 mcg  50 mcg Intravenous Daily Donita Brooks, NP   50 mcg at 08/03/15 0950  . LORazepam (ATIVAN) injection 1-2 mg  1-2 mg Intravenous Q4H PRN Rush Farmer, MD      . prochlorperazine (COMPAZINE) injection 5 mg  5 mg Intravenous Q6H PRN Anders Simmonds, MD      . propofol (DIPRIVAN) 1000 MG/100ML  infusion  0-50 mcg/kg/min Intravenous Continuous Donita Brooks, NP   Stopped at 08/03/15 1045    Musculoskeletal: Strength & Muscle Tone: within normal limits Gait & Station: normal Patient leans: N/A  Psychiatric Specialty Exam: Physical Exam   ROS  No Fever-chills, No Headache, No changes with Vision or hearing, reports vertigo No problems swallowing food or Liquids, No Chest pain, Cough or Shortness of Breath, No Abdominal pain, No Nausea or Vommitting, Bowel movements are regular, No Blood in stool or Urine, No dysuria, No new skin rashes or bruises, No new joints pains-aches,  No new weakness, tingling, numbness in any extremity, No recent weight gain or loss, No polyuria, polydypsia or polyphagia,   A full 10 point Review of Systems was done, except as stated above, all other Review of Systems were negative.  Blood pressure 140/90, pulse 80, temperature 99.1 F (37.3 C), temperature source Oral, resp. rate 18, height 5' 4"  (1.626 m), weight 105.2 kg (231 lb 14.8 oz), SpO2 96 %.Body mass index is 39.79 kg/(m^2).  General Appearance: Casual  Eye Contact:  Good  Speech:  Clear and Coherent  Volume:  Normal  Mood:  Anxious and Depressed  Affect:  Appropriate and Congruent  Thought Process:  Coherent and Goal Directed  Orientation:  Full (Time, Place, and Person)  Thought Content:  WDL  Suicidal Thoughts:  No, Status post unintentional overdose of baclofen and history of multiple intentional overdoses   Homicidal Thoughts:  No  Memory:  Immediate;   Fair Recent;   Poor  Judgement:  Impaired  Insight:  Fair  Psychomotor Activity:  Normal  Concentration:  Concentration: Fair and Attention Span: Poor  Recall:  [employer] of Knowledge:  Fair  Language:  Good  Akathisia:  Negative  Handed:  Right  AIMS (if indicated):     Assets:  Communication Skills Desire for Improvement Financial Resources/Insurance Housing Intimacy Leisure Time Resilience Social  Support Transportation  ADL's:  Intact  Cognition:  Impaired,  Mild  Sleep:        Treatment Plan Summary: Brittany [name] is a 50 year old female with a history of depression, mood instability, multiple suicide attempts who was admitted after overdose on baclofen after being upset regarding her dog sneaking out of the home. Patient denies active suicidal ideation and intent. She has no homicidal ideation, intention or plan and no evidence of psychosis.  Review of labs indicated patient urine drug screen is positive for benzos, opiates and barbiturates. Personally reviewed EKG which indicated normal sinus rhythm and no QTC prolongation. Patient has mildly elevated total CK.  1. Suicidal ideation: Patient adamantly denies thoughts, intentions, or plans to hurt herself or others. She is able to contract for safety. She is forward thinking and optimistic about the future and has a scheduled neurological procedure and also has a legal matters regarding her house.  2. Mood:  Cymbalta and Seroquel for depression. Tegretol for mood stabilization and neuralgia.  3. Anxiety:  Clonazepam and Vistaril as needed.  4. Hypothyroidism. Hormone replacement therapy with Synthroid 100 g daily by mouth prior breakfast.  5.  Trigeminal neuralgia. May take Fioricet only and patient no longer is on Flexeril o or baclofen due to overdose, patient in her fianc is willing to get rid of the remaining supply of those medication from medication and keeping away from her access. Patient can continue taking Tegretol 300 mg daily for trigeminal neuralgia as per neurology. She has scheduled microvascular decompression of trigeminal nerve on 08/06/2015 as per patient and her fianc.  Disposition: Patient will be referred to the outpatient medication management No evidence of imminent risk to self or others at present.   Patient does not meet criteria for psychiatric inpatient admission. Supportive therapy provided about  ongoing stressors.  Ambrose Finland, MD 08/04/2015 9:26 AM

## 2015-08-05 DIAGNOSIS — F332 Major depressive disorder, recurrent severe without psychotic features: Secondary | ICD-10-CM

## 2015-08-05 NOTE — Care Management Note (Signed)
Case Management Note  Patient Details  Name: Brittany [name] MRN: [id number] Date of Birth: 1965/11/16  Subjective/Objective:          CM following for progression and d/c planning.           Action/Plan: 08/05/2015 Noted plan to d/c pt to home, no HH or DME needs identified.   Expected Discharge Date:    08/05/2015           Expected Discharge Plan:  Home/Self Care  In-House Referral:  Clinical Social Work  Discharge planning Services  NA  Post Acute Care Choice:  NA Choice offered to:  NA  DME Arranged:   NA DME Agency:   NA  HH Arranged:   NA HH Agency:   NA  Status of Service:  Completed, signed off  If discussed at [employer] Length [employer] Stay Meetings, dates discussed:    Additional Comments:  Starlyn SkeansRoyal, Belvin Gauss U, RN 08/05/2015, 12:12 PM

## 2015-08-05 NOTE — Progress Notes (Signed)
Reviewed discharge instructions with pt. Pt verbalized understanding. Removed PIV no issues Pt to leave the unit via wheelchair to private vehicle.  Belongings sent home to with patient.

## 2015-08-05 NOTE — Discharge Summary (Signed)
[facility] Discharge Summary   Patient: Brittany [name] XBJ:[id number]   PCP: [name], [name], MD DOB: Jun 25, 1965   Date of admission: 08/02/2015   Date of discharge:  08/05/2015    Discharge Diagnoses:  Principal Problem:   Overdose Active Problems:   Severe episode of recurrent major depressive disorder, without psychotic features (HCC)  Admitted From: Home Disposition:  Home  Recommendations for Outpatient Follow-up:  1. Please follow-up with PCP in one week. 2. Please establish care with outpatient psychiatry 3. Please follow-up with neurosurgery as scheduled. 4. Please remain compliant with medical regimen.   Follow-up Information    Follow up with [name], [name], MD. Schedule an appointment as soon as possible for a visit in 1 week.   Specialty:  Family Medicine   Why:  APPOINTMENT: ATTEMPTED TO CONTACT OFFICE FOR APPOINTMENT, BUT THERE WAS NO ANSWER   Contact informationElla Bodo:   [facility] [place] [address] [phone]       [facility]. Call in 1 week.   Specialty:  Behavioral Health   Why:  for appointment   Contact information:   [address] [place] [address] [phone]       Follow up with CABBELL,KYLE L, MD. Call in 1 week.   Specialty:  Neurosurgery   Why:  for appointment   Contact information:   [address] [place] [address] [phone]      Diet recommendation: Regular diet  Activity: The patient is advised to gradually reintroduce usual activities.  Discharge Condition: fair  Code Status: Full code  History of present illness: As per the H and P dictated on admission, "50 y/o F with PMH of HTN, HLD, Murmur, migraines, GERD, hypothyroidism, goiter, chronic bronchitis, seizures, bipolar disorder, chronic low back pain, anxiety and frequent admissions in the past six months for overdose who [facility] after being found altered with snoring respirations.   The initial work  up found the patient to be altered, bradycardic, minimal response to narcan but saturations of 100% on 4L. Initial labs Na 145, K 5.5, Cl 113, CO2 29, Sr Cr 0.74, AST 58, ALT 14, T. Bili 1.4, CK 538, WBC 7.6, Hgb 13.5 and platelets 292. CXR showed cardiomegaly but no active disease. The patient was treated with a narcan gtt with little improvement in mental status. Exam showed little to no gag reflex with deep suction. Home medication review showed long acting benzo's, percocet, atarax, cymbalta and carbamazepine. Given concern for long acting medications and poor mental status, decision was made to intubate.   PCCM called for ICU admission. "  Hospital Course:  The patient presented with drug overdose requiring intubation and ICU admission. Patient maintains that she took more than her usual medication prescribed. She was upset her dog was lost. Patient was extubated and currently mentating okay. Patient was evaluated by psychiatry, in the dispoition is as  Following,  "Patient will be referred to the outpatient medication management No evidence of imminent risk to self or others at present.  Patient does not meet criteria for psychiatric inpatient admission. Supportive therapy provided about ongoing stressors."  Summary of her active problems in the hospital is as following. Acute Respiratory Failure - in setting of overdose - resolved intubated for airway protection - resolved w/ sats now 96% on RA  Overdose Psych has Recommended that the pt can be safely discharged back to outpatient psych follow up with continuation of her clonazepam, vistaril, cymbalta as  well as seroquel. Also Recommend to continue tegretol at present. Her muscle relaxant will be stopped. As well as percocet is stopped. Per psych, "No evidence of imminent risk to self or others at present.  Patient does not meet criteria for psychiatric inpatient admission."  Bradycardia suspect related to overdose -  resolved   HTN  Blood pressure stable  Grade 1 Diastolic Dysfunction Per TTE 04/23/15 - no evidence of volume overload at present  Hypokalemia Replaced  Elevated CK  Resolved   Hx Goiter - Hypothyroidism TSH 1.9 - cont synthroid   Hx Anxiety, Bipolar Disorder Continue klonopin per psychiatry  Chronic Back Pain Chronic trigeminal neuralgia Neurosurgery will call pt to follow up and further plans for surgery   All other chronic medical condition were stable during the hospitalization.  Patient was ambulatory without any assistance. On the day of the discharge the patient's vitals were stable, and no other acute medical condition were reported by patient. the patient was felt safe to be discharge at home with family.  Procedures and Results:  Intubation and extubation  Central line insertion and removal   Consultations:  Psychiatry  PCCM admission  DISCHARGE MEDICATION: Current Discharge Medication List    CONTINUE these medications which have NOT CHANGED   Details  butalbital-acetaminophen-caffeine (FIORICET, ESGIC) 50-325-40 MG tablet Take 1 tablet by mouth 2 (two) times daily as needed for headache.    Carbamazepine (EQUETRO) 300 MG CP12 Take 1 capsule (300 mg total) by mouth daily. Qty: 30 each, Refills: 0    clonazePAM (KLONOPIN) 1 MG tablet Takes 1 tablet by mouth four times a day Refills: 0    DULoxetine (CYMBALTA) 60 MG capsule Take 1 capsule (60 mg total) by mouth daily. Qty: 30 capsule, Refills: 0    fluticasone (FLONASE) 50 MCG/ACT nasal spray Place 1 spray into both nostrils daily.    gabapentin (NEURONTIN) 400 MG capsule Take 2 capsules (800 mg total) by mouth 3 (three) times daily. Qty: 30 capsule, Refills: 0    levothyroxine (SYNTHROID, LEVOTHROID) 100 MCG tablet Take 100 mcg by mouth daily before breakfast.    PROAIR HFA 108 (90 Base) MCG/ACT inhaler Take 2 puffs by mouth every 4 (four) hours as needed. Refills: 5    QVAR 80 MCG/ACT  inhaler Take 2 puffs by mouth 2 (two) times daily. Refills: 5    traMADol (ULTRAM) 50 MG tablet Take 50 mg by mouth every 6 (six) hours as needed (pain).    Vitamin D, Ergocalciferol, (DRISDOL) 50000 units CAPS capsule Take 50,000 Units by mouth.    b complex vitamins tablet Take 1 tablet by mouth daily.    Cholecalciferol (VITAMIN D PO) Take 1 tablet by mouth daily. Reported on 05/26/2015    hydrOXYzine (ATARAX/VISTARIL) 50 MG tablet Take 1 tablet (50 mg total) by mouth 3 (three) times daily as needed for anxiety (sleep). Qty: 90 tablet, Refills: 0    LISINOPRIL PO Take by mouth.    Multiple Vitamin (MULTIVITAMIN WITH MINERALS) TABS tablet Take 1 tablet by mouth daily.     thiamine 100 MG tablet Take 1 tablet (100 mg total) by mouth daily.      STOP taking these medications     baclofen (LIORESAL) 10 MG tablet      methocarbamol (ROBAXIN) 500 MG tablet      oxyCODONE-acetaminophen (PERCOCET/ROXICET) 5-325 MG tablet      hydrochlorothiazide (HYDRODIURIL) 25 MG tablet        Allergies  Allergen Reactions  . Toradol [  Ketorolac Tromethamine] Swelling  . Suboxone [Buprenorphine Hcl-Naloxone Hcl] Other (See Comments)    cellulitis   . Trazodone And Nefazodone Other (See Comments)    Bladder infection   Discharge Instructions    Diet - low sodium heart healthy    Complete by:  As directed      Discharge instructions    Complete by:  As directed   It is important that you read following instructions as well as go over your medication list with RN to help you understand your care after this hospitalization.  Discharge Instructions: Please follow-up with PCP in one week Please follow up with neurosurgery in 1-2 weeks.  Please request your primary care physician to go over all Hospital Tests and Procedure/Radiological results at the follow up,  Please get all Hospital records sent to your PCP by signing hospital release before you go home.   Do not drive, operating heavy  machinery, perform activities at heights, swimming or participation in water activities or provide baby sitting services ; until you have been seen by Primary Care Physician or a Neurologist and advised to do so again. Do not take more than prescribed Pain, Sleep and Anxiety Medications. You were cared for by a hospitalist during your hospital stay. If you have any questions about your discharge medications or the care you received while you were in the hospital after you are discharged, you can call the unit and ask to speak with the hospitalist on call if the hospitalist that took care of you is not available.  Once you are discharged, your primary care physician will handle any further medical issues. Please note that NO REFILLS for any discharge medications will be authorized once you are discharged, as it is imperative that you return to your primary care physician (or establish a relationship with a primary care physician if you do not have one) for your aftercare needs so that they can reassess your need for medications and monitor your lab values. You Must read complete instructions/literature along with all the possible adverse reactions/side effects for all the Medicines you take and that have been prescribed to you. Take any new Medicines after you have completely understood and accept all the possible adverse reactions/side effects. Wear Seat belts while driving. If you have smoked or chewed Tobacco in the last 2 yrs please stop smoking and/or stop any Recreational drug use.     Increase activity slowly    Complete by:  As directed           Discharge Exam: Filed Weights   08/03/15 0500 08/04/15 0351 08/04/15 2028  Weight: 108.8 kg (239 lb 13.8 oz) 105.2 kg (231 lb 14.8 oz) 105.507 kg (232 lb 9.6 oz)   Filed Vitals:   08/05/15 0500 08/05/15 0901  BP: 114/87 125/87  Pulse: 81 78  Temp: 98.2 F (36.8 C) 97.7 F (36.5 C)  Resp: 18 18   General: Appear in no distress, no Rash;  Oral Mucosa moist. Cardiovascular: S1 and S2 Present, no Murmur, no JVD Respiratory: Bilateral Air entry present and Clear to Auscultation, no Crackles, no wheezes Abdomen: Bowel Sound present, Soft and no tenderness Extremities: no Pedal edema, no calf tenderness Neurology: Grossly no focal neuro deficit.  The results of significant diagnostics from this hospitalization (including imaging, microbiology, ancillary and laboratory) are listed below for reference.    Significant Diagnostic Studies: Dg Chest 1 View  08/02/2015  CLINICAL DATA:  Drug overdose. EXAM: CHEST 1 VIEW COMPARISON:  None.  FINDINGS: Mild cardiomegaly. No overt failure. No confluent opacities or effusions. No acute bony abnormality. IMPRESSION: Cardiomegaly.  No active disease. Electronically Signed   By: Charlett Nose M.D.   On: 08/02/2015 11:34   Ct Head Wo Contrast  08/02/2015  CLINICAL DATA:  Overdose. EXAM: CT HEAD WITHOUT CONTRAST TECHNIQUE: Contiguous axial images were obtained from the base of the skull through the vertex without intravenous contrast. COMPARISON:  06/10/2015 FINDINGS: No acute intracranial abnormality. Specifically, no hemorrhage, hydrocephalus, mass lesion, acute infarction, or significant intracranial injury. No acute calvarial abnormality. Visualized paranasal sinuses and mastoids clear. Orbital soft tissues unremarkable. IMPRESSION: No acute intracranial abnormality. Electronically Signed   By: Charlett Nose M.D.   On: 08/02/2015 13:01   Mr Laqueta Jean NF Contrast  07/19/2015  CLINICAL DATA:  RIGHT-sided trigeminal neuralgia for 2 years. History of head trauma. EXAM: MRI HEAD WITHOUT AND WITH CONTRAST TECHNIQUE: Multiplanar, multiecho pulse sequences of the brain and surrounding structures were obtained without and with intravenous contrast. CONTRAST:  20mL MULTIHANCE GADOBENATE DIMEGLUMINE 529 MG/ML IV SOLN COMPARISON:  CT head 06/10/2015.  MR head 02/27/2015 FINDINGS: Thin-section imaging was performed  through the brainstem with special attention to the trigeminal nerve. Normal-appearing brainstem. No perimesencephalic or pre-pontine masses. Trigeminal nerve root exit zones are seen bilaterally. There is no demonstrable mass effect on the trigeminal nerve from either the superior cerebellar artery or the anterior inferior cerebellar artery. There is no cavernous sinus mass. No skull base osseous abnormality. No visible abnormal postcontrast enhancement. No sinus or orbital disease. No middle ear or mastoid fluid. No visible brainstem abnormality. No abnormality of the carotid or basilar arteries. IMPRESSION: Negative exam. No structural abnormality is seen to correlate with the patient's presumed trigeminal neuralgia. Electronically Signed   By: Elsie Stain M.D.   On: 07/19/2015 16:08   Dg Chest Port 1 View  08/04/2015  CLINICAL DATA:  Respiratory failure. EXAM: PORTABLE CHEST 1 VIEW COMPARISON:  08/03/2015. FINDINGS: Interim extubation and removal of NG tube. Mediastinum and hilar structures are normal. Cardiomegaly with normal pulmonary vascularity. Mild bibasilar subsegmental atelectasis, improved from prior exam. No pleural effusion or pneumothorax. IMPRESSION: 1. Interim extubation removal of NG tube. 2.  Cardiomegaly.  No evidence of congestive heart failure. 3. Mild bibasilar subsegmental atelectasis, improved from prior exam . Electronically Signed   By: Maisie Fus  Register   On: 08/04/2015 07:01   Dg Chest Port 1 View  08/03/2015  CLINICAL DATA:  Respiratory failure.  Shortness of breath. EXAM: PORTABLE CHEST 1 VIEW COMPARISON:  08/02/2015. FINDINGS: Endotracheal tube has been retracted, its tip is 2.8 cm above the carina. NG tube in stable position. Heart size stable. Low lung volumes with mild bibasilar atelectasis. No pleural effusion or pneumothorax . IMPRESSION: 1. Endotracheal tube is been retracted, its tip is 2.8 cm above the carina in good anatomic position. NG tube in stable position. 2.  Low lung volumes with mild bibasilar atelectasis. Electronically Signed   By: Maisie Fus  Register   On: 08/03/2015 06:58   Dg Chest Port 1 View  08/02/2015  CLINICAL DATA:  Overdose, respiratory failure, status post intubation. EXAM: PORTABLE CHEST 1 VIEW COMPARISON:  08/02/2015 at 11:25 a.m. FINDINGS: Endotracheal tube tip is 10 mm above the carina. Consider retracting 1.5 cm as a safety margin. Low lung volumes are present, causing crowding of the pulmonary vasculature. Mild enlargement of the cardiopericardial silhouette, without edema. No discrete airspace opacity. IMPRESSION: 1. Endotracheal tube tip is 10 mm above the carina. Consider  retracting 1.5 cm for a safety margin. 2. Low lung volumes are present, causing crowding of the pulmonary vasculature. 3. Mild enlargement of the cardiopericardial silhouette. Electronically Signed   By: Gaylyn Rong M.D.   On: 08/02/2015 14:42    Microbiology: Recent Results (from the past 240 hour(s))  MRSA PCR Screening     Status: None   Collection Time: 08/02/15  4:47 PM  Result Value Ref Range Status   MRSA by PCR NEGATIVE NEGATIVE Final    Comment:        The GeneXpert MRSA Assay (FDA approved for NASAL specimens only), is one component of a comprehensive MRSA colonization surveillance program. It is not intended to diagnose MRSA infection nor to guide or monitor treatment for MRSA infections.      Labs: CBC:  Recent Labs Lab 08/02/15 1216 08/03/15 0737 08/04/15 0500  WBC 7.6 8.9 7.1  NEUTROABS 4.5  --   --   HGB 13.5 13.2 12.3  HCT 42.5 42.1 38.1  MCV 91.8 93.6 89.4  PLT 292 210 239   Basic Metabolic Panel:  Recent Labs Lab 08/02/15 1216 08/03/15 0737 08/04/15 0500  NA 145 142 139  K 5.5* 3.6 3.3*  CL 113* 108 105  CO2 GLUCOSE 94 112* 101*  BUN 6 7 <5*  CREATININE 0.74 0.64 0.63  CALCIUM 8.6* 8.6* 9.0  MG  --  1.9  --   PHOS  --  3.5  --    Liver Function Tests:  Recent Labs Lab 08/02/15 1216  AST  58*  ALT 14  ALKPHOS 60  BILITOT 1.4*  PROT 6.2*  ALBUMIN 3.7   No results for input(s): LIPASE, AMYLASE in the last 168 hours. No results for input(s): AMMONIA in the last 168 hours. Cardiac Enzymes:  Recent Labs Lab 08/02/15 1216 08/03/15 0737  CKTOTAL 538* 358*   BNP (last 3 results) No results for input(s): BNP in the last 8760 hours. CBG:  Recent Labs Lab 08/03/15 1949 08/04/15 0003 08/04/15 0347 08/04/15 0733 08/04/15 1107  GLUCAP 123* 78 89 113* 111*   Time spent: 30 minutes  Signed:  Chaun Uemura  [facility]  08/05/2015  , 1:13 PM

## 2015-08-06 ENCOUNTER — Inpatient Hospital Stay (HOSPITAL_COMMUNITY): Admission: RE | Admit: 2015-08-06 | Payer: Medicaid Other | Source: Ambulatory Visit | Admitting: Neurosurgery

## 2015-08-06 ENCOUNTER — Encounter (HOSPITAL_COMMUNITY): Admission: RE | Payer: Self-pay | Source: Ambulatory Visit

## 2015-08-06 SURGERY — CRANIECTOMY FOR TIC DOULOUREUX
Anesthesia: General | Laterality: Right

## 2015-08-30 ENCOUNTER — Telehealth: Payer: Self-pay

## 2015-08-30 NOTE — Telephone Encounter (Signed)
Fax sent to [facility] requesting recent ov notes.

## 2015-08-30 NOTE — Telephone Encounter (Signed)
Pt was seen by Dr. Franky Machoabbell at [facility]. Pt states they would like her to have a second opinion by Rockne MenghiniKilpatrick, [facility] ([address], [place], [place]  [phone]). Pt states they told her we would refer her. Have you seen Dr. Sueanne Margaritaabbell's notes? Do I need to place referral? Pt also states that she will need a f/u sooner than her August appointment, because you will have to clear her for surgery. Pt also wants DX written down, so family will believe that she is suffering from ailment. Advised I could print her out copy of her last office visit note. Please advise.

## 2015-08-30 NOTE — Telephone Encounter (Signed)
I would like to review notes.  Why do I need to clear her for surgery when I referred her to see if she would be a candidate for possible surgery?

## 2015-08-31 NOTE — Telephone Encounter (Signed)
Refer her to the second opinion.

## 2015-08-31 NOTE — Telephone Encounter (Signed)
Note now in inbox.

## 2015-08-31 NOTE — Telephone Encounter (Signed)
Brittany HughsKimberly [name] [phone] Fax Number for [facility] attn Sharyne PeachEd Turnbull.  This is her PCP office and she would like her Dx sent to them. She will see her PCP on 09/06/15 at 430.  Her phone # [phone]. Thank you

## 2015-08-31 NOTE — Telephone Encounter (Signed)
Left message for pt to return call.

## 2015-08-31 NOTE — Telephone Encounter (Signed)
Called Dawna at [facility] and requested medical records again.

## 2015-09-03 NOTE — Telephone Encounter (Signed)
Pt is to come sign a medical release for info to be faxed. Pt is aware.

## 2015-09-06 ENCOUNTER — Telehealth: Payer: Self-pay

## 2015-09-06 NOTE — Telephone Encounter (Signed)
Patient called with continuing complaints of pain. Pt states that the neurosurgeon did not think it was trigeminal neuralgia, but that it was in her head. Pt was referred to a seek a second opinion by the [facility] in [place]. Advised that we could place a pain specialist referral for her. Pt agreeable, but states she doubts they'll be any help. Pt very distraught. Please advise.

## 2015-09-06 NOTE — Telephone Encounter (Signed)
Pain referral sent to [facility] ([address], [place], [place] [address]    Ph: [phone]  Fax: [phone])

## 2015-09-06 NOTE — Telephone Encounter (Signed)
She has already had complications to the medications I would typically prescribe.  There really isn't anything else I can prescribe that would help her.  I think she requires a pain specialist.

## 2015-09-06 NOTE — Telephone Encounter (Signed)
Attempted to reach pt on both lines, I was not able to leave message on either line.

## 2015-09-07 NOTE — Telephone Encounter (Signed)
Attempted to reach pt again. No answer. Will close encounter until I hear from patient.

## 2015-09-08 ENCOUNTER — Telehealth: Payer: Self-pay

## 2015-09-22 NOTE — Telephone Encounter (Signed)
Opened in error

## 2015-09-28 ENCOUNTER — Ambulatory Visit: Payer: Self-pay | Admitting: Neurology

## 2015-10-04 ENCOUNTER — Ambulatory Visit: Payer: Self-pay | Admitting: Neurology

## 2015-10-25 ENCOUNTER — Telehealth: Payer: Self-pay | Admitting: Neurology

## 2015-10-28 ENCOUNTER — Inpatient Hospital Stay (HOSPITAL_COMMUNITY)
Admission: EM | Admit: 2015-10-28 | Discharge: 2015-11-03 | DRG: 917 | Disposition: A | Discharge status: Home / Self Care | Payer: Medicaid Other | Attending: Internal Medicine | Admitting: Internal Medicine

## 2015-10-28 ENCOUNTER — Encounter (HOSPITAL_COMMUNITY): Payer: Self-pay | Admitting: Emergency Medicine

## 2015-10-28 ENCOUNTER — Emergency Department (HOSPITAL_COMMUNITY): Payer: Medicaid Other

## 2015-10-28 DIAGNOSIS — Z888 Allergy status to other drugs, medicaments and biological substances status: Secondary | ICD-10-CM | POA: Diagnosis not present

## 2015-10-28 DIAGNOSIS — I10 Essential (primary) hypertension: Secondary | ICD-10-CM | POA: Diagnosis present

## 2015-10-28 DIAGNOSIS — E039 Hypothyroidism, unspecified: Secondary | ICD-10-CM | POA: Diagnosis present

## 2015-10-28 DIAGNOSIS — T428X2D Poisoning by antiparkinsonism drugs and other central muscle-tone depressants, intentional self-harm, subsequent encounter: Secondary | ICD-10-CM | POA: Diagnosis not present

## 2015-10-28 DIAGNOSIS — T428X2A Poisoning by antiparkinsonism drugs and other central muscle-tone depressants, intentional self-harm, initial encounter: Secondary | ICD-10-CM

## 2015-10-28 DIAGNOSIS — J42 Unspecified chronic bronchitis: Secondary | ICD-10-CM | POA: Diagnosis present

## 2015-10-28 DIAGNOSIS — F3160 Bipolar disorder, current episode mixed, unspecified: Secondary | ICD-10-CM | POA: Diagnosis not present

## 2015-10-28 DIAGNOSIS — K219 Gastro-esophageal reflux disease without esophagitis: Secondary | ICD-10-CM | POA: Diagnosis present

## 2015-10-28 DIAGNOSIS — Z4659 Encounter for fitting and adjustment of other gastrointestinal appliance and device: Secondary | ICD-10-CM

## 2015-10-28 DIAGNOSIS — Z886 Allergy status to analgesic agent status: Secondary | ICD-10-CM | POA: Diagnosis not present

## 2015-10-28 DIAGNOSIS — K0889 Other specified disorders of teeth and supporting structures: Secondary | ICD-10-CM | POA: Diagnosis present

## 2015-10-28 DIAGNOSIS — T50902A Poisoning by unspecified drugs, medicaments and biological substances, intentional self-harm, initial encounter: Secondary | ICD-10-CM

## 2015-10-28 DIAGNOSIS — F419 Anxiety disorder, unspecified: Secondary | ICD-10-CM | POA: Diagnosis present

## 2015-10-28 DIAGNOSIS — Z90710 Acquired absence of both cervix and uterus: Secondary | ICD-10-CM

## 2015-10-28 DIAGNOSIS — T1491 Suicide attempt: Secondary | ICD-10-CM | POA: Diagnosis not present

## 2015-10-28 DIAGNOSIS — Z79899 Other long term (current) drug therapy: Secondary | ICD-10-CM

## 2015-10-28 DIAGNOSIS — E785 Hyperlipidemia, unspecified: Secondary | ICD-10-CM | POA: Diagnosis present

## 2015-10-28 DIAGNOSIS — E876 Hypokalemia: Secondary | ICD-10-CM | POA: Diagnosis present

## 2015-10-28 DIAGNOSIS — Z885 Allergy status to narcotic agent status: Secondary | ICD-10-CM | POA: Diagnosis not present

## 2015-10-28 DIAGNOSIS — T428X4D Poisoning by antiparkinsonism drugs and other central muscle-tone depressants, undetermined, subsequent encounter: Secondary | ICD-10-CM | POA: Diagnosis not present

## 2015-10-28 DIAGNOSIS — G92 Toxic encephalopathy: Secondary | ICD-10-CM | POA: Diagnosis present

## 2015-10-28 DIAGNOSIS — G500 Trigeminal neuralgia: Secondary | ICD-10-CM | POA: Diagnosis present

## 2015-10-28 DIAGNOSIS — Z915 Personal history of self-harm: Secondary | ICD-10-CM | POA: Diagnosis not present

## 2015-10-28 DIAGNOSIS — J9601 Acute respiratory failure with hypoxia: Secondary | ICD-10-CM | POA: Diagnosis present

## 2015-10-28 DIAGNOSIS — T428X1A Poisoning by antiparkinsonism drugs and other central muscle-tone depressants, accidental (unintentional), initial encounter: Secondary | ICD-10-CM | POA: Diagnosis present

## 2015-10-28 DIAGNOSIS — Z87891 Personal history of nicotine dependence: Secondary | ICD-10-CM | POA: Diagnosis not present

## 2015-10-28 DIAGNOSIS — G9340 Encephalopathy, unspecified: Secondary | ICD-10-CM

## 2015-10-28 DIAGNOSIS — M545 Low back pain: Secondary | ICD-10-CM | POA: Diagnosis present

## 2015-10-28 DIAGNOSIS — Z7951 Long term (current) use of inhaled steroids: Secondary | ICD-10-CM | POA: Diagnosis not present

## 2015-10-28 DIAGNOSIS — G8929 Other chronic pain: Secondary | ICD-10-CM | POA: Diagnosis present

## 2015-10-28 DIAGNOSIS — F319 Bipolar disorder, unspecified: Secondary | ICD-10-CM | POA: Diagnosis present

## 2015-10-28 DIAGNOSIS — T428X1D Poisoning by antiparkinsonism drugs and other central muscle-tone depressants, accidental (unintentional), subsequent encounter: Secondary | ICD-10-CM | POA: Diagnosis not present

## 2015-10-28 DIAGNOSIS — J9600 Acute respiratory failure, unspecified whether with hypoxia or hypercapnia: Secondary | ICD-10-CM | POA: Diagnosis not present

## 2015-10-28 LAB — URINALYSIS, ROUTINE W REFLEX MICROSCOPIC
BILIRUBIN URINE: NEGATIVE
Glucose, UA: NEGATIVE mg/dL
Hgb urine dipstick: NEGATIVE
KETONES UR: NEGATIVE mg/dL
Leukocytes, UA: NEGATIVE
NITRITE: NEGATIVE
Protein, ur: NEGATIVE mg/dL
SPECIFIC GRAVITY, URINE: 1.009 (ref 1.005–1.030)
pH: 5.5 (ref 5.0–8.0)

## 2015-10-28 LAB — PROTIME-INR
INR: 0.9
Prothrombin Time: 12.1 seconds (ref 11.4–15.2)

## 2015-10-28 LAB — BLOOD GAS, ARTERIAL
ACID-BASE DEFICIT: 1.7 mmol/L (ref 0.0–2.0)
Bicarbonate: 24.1 mmol/L (ref 20.0–28.0)
DRAWN BY: 11249
FIO2: 100
LHR: 18 {breaths}/min
MECHVT: 460 mL
O2 SAT: 99.1 %
PATIENT TEMPERATURE: 34.8
PEEP/CPAP: 5 cmH2O
pCO2 arterial: 42.9 mmHg (ref 32.0–48.0)
pH, Arterial: 7.355 (ref 7.350–7.450)
pO2, Arterial: 230 mmHg — ABNORMAL HIGH (ref 83.0–108.0)

## 2015-10-28 LAB — CBC WITH DIFFERENTIAL/PLATELET
Basophils Absolute: 0 10*3/uL (ref 0.0–0.1)
Basophils Relative: 0 %
EOS PCT: 2 %
Eosinophils Absolute: 0.2 10*3/uL (ref 0.0–0.7)
HCT: 39.7 % (ref 36.0–46.0)
Hemoglobin: 13.6 g/dL (ref 12.0–15.0)
LYMPHS ABS: 3.1 10*3/uL (ref 0.7–4.0)
LYMPHS PCT: 32 %
MCH: 30.8 pg (ref 26.0–34.0)
MCHC: 34.3 g/dL (ref 30.0–36.0)
MCV: 89.8 fL (ref 78.0–100.0)
MONO ABS: 0.6 10*3/uL (ref 0.1–1.0)
MONOS PCT: 6 %
Neutro Abs: 5.7 10*3/uL (ref 1.7–7.7)
Neutrophils Relative %: 60 %
PLATELETS: 283 10*3/uL (ref 150–400)
RBC: 4.42 MIL/uL (ref 3.87–5.11)
RDW: 13.7 % (ref 11.5–15.5)
WBC: 9.7 10*3/uL (ref 4.0–10.5)

## 2015-10-28 LAB — I-STAT CG4 LACTIC ACID, ED
Lactic Acid, Venous: 1.54 mmol/L (ref 0.5–1.9)
Lactic Acid, Venous: 4.7 mmol/L (ref 0.5–1.9)

## 2015-10-28 LAB — I-STAT CHEM 8, ED
BUN: 8 mg/dL (ref 6–20)
CREATININE: 0.9 mg/dL (ref 0.44–1.00)
Calcium, Ion: 1.14 mmol/L — ABNORMAL LOW (ref 1.15–1.40)
Chloride: 102 mmol/L (ref 101–111)
Glucose, Bld: 106 mg/dL — ABNORMAL HIGH (ref 65–99)
HEMATOCRIT: 42 % (ref 36.0–46.0)
Hemoglobin: 14.3 g/dL (ref 12.0–15.0)
Potassium: 3.5 mmol/L (ref 3.5–5.1)
Sodium: 140 mmol/L (ref 135–145)
TCO2: 27 mmol/L (ref 0–100)

## 2015-10-28 LAB — RAPID URINE DRUG SCREEN, HOSP PERFORMED
Amphetamines: NOT DETECTED
Barbiturates: NOT DETECTED
Benzodiazepines: POSITIVE — AB
COCAINE: POSITIVE — AB
OPIATES: NOT DETECTED
Tetrahydrocannabinol: NOT DETECTED

## 2015-10-28 LAB — ACETAMINOPHEN LEVEL: Acetaminophen (Tylenol), Serum: <10 ug/mL — ABNORMAL LOW (ref 10–30)

## 2015-10-28 LAB — COMPREHENSIVE METABOLIC PANEL
ALBUMIN: 4.5 g/dL (ref 3.5–5.0)
ALK PHOS: 74 U/L (ref 38–126)
ALT: 24 U/L (ref 14–54)
AST: 21 U/L (ref 15–41)
Anion gap: 9 (ref 5–15)
BILIRUBIN TOTAL: 0.4 mg/dL (ref 0.3–1.2)
BUN: 9 mg/dL (ref 6–20)
CALCIUM: 8.8 mg/dL — AB (ref 8.9–10.3)
CO2: 25 mmol/L (ref 22–32)
CREATININE: 0.71 mg/dL (ref 0.44–1.00)
Chloride: 105 mmol/L (ref 101–111)
GFR calc Af Amer: >60 mL/min (ref >60)
GLUCOSE: 97 mg/dL (ref 65–99)
Potassium: 3.5 mmol/L (ref 3.5–5.1)
Sodium: 139 mmol/L (ref 135–145)
TOTAL PROTEIN: 8.6 g/dL — AB (ref 6.5–8.1)

## 2015-10-28 LAB — I-STAT BETA HCG BLOOD, ED (MC, WL, AP ONLY)

## 2015-10-28 LAB — SALICYLATE LEVEL: Salicylate Lvl: <4 mg/dL (ref 2.8–30.0)

## 2015-10-28 LAB — CBG MONITORING, ED: Glucose-Capillary: 99 mg/dL (ref 65–99)

## 2015-10-28 LAB — TROPONIN I: Troponin I: <0.03 ng/mL (ref <0.03)

## 2015-10-28 LAB — ETHANOL: Alcohol, Ethyl (B): 151 mg/dL — ABNORMAL HIGH (ref <5)

## 2015-10-28 MED ORDER — MIDAZOLAM HCL 2 MG/2ML IJ SOLN: 2.0000 mg | INTRAMUSCULAR | Status: DC | PRN

## 2015-10-28 MED ORDER — DEXTROSE-NACL 5-0.9 % IV SOLN
INTRAVENOUS | Status: DC
  Administered 2015-10-29 – 2015-11-02 (×10): via INTRAVENOUS

## 2015-10-28 MED ORDER — HYDRALAZINE HCL 20 MG/ML IJ SOLN: 10.0000 mg | INTRAMUSCULAR | Status: DC | PRN

## 2015-10-28 MED ORDER — SODIUM CHLORIDE 0.9 % IV SOLN
250.0000 mL | INTRAVENOUS | Status: DC | PRN
  Administered 2015-10-30: 1000 mL via INTRAVENOUS

## 2015-10-28 MED ORDER — SUCCINYLCHOLINE CHLORIDE 20 MG/ML IJ SOLN
INTRAMUSCULAR | Status: AC | PRN
  Administered 2015-10-28: 120 mg via INTRAVENOUS

## 2015-10-28 MED ORDER — FENTANYL CITRATE (PF) 100 MCG/2ML IJ SOLN: 100.0000 ug | INTRAMUSCULAR | Status: DC | PRN

## 2015-10-28 MED ORDER — PANTOPRAZOLE SODIUM 40 MG PO PACK
40.0000 mg | PACK | Freq: Every day | ORAL | Status: DC
  Administered 2015-10-29 – 2015-11-02 (×3): 40 mg
  Filled 2015-10-28 (×4): qty 20

## 2015-10-28 MED ORDER — SODIUM CHLORIDE 0.9 % IV SOLN
INTRAVENOUS | Status: DC
  Administered 2015-10-28 (×2): via INTRAVENOUS

## 2015-10-28 MED ORDER — POTASSIUM CHLORIDE 10 MEQ/100ML IV SOLN
10.0000 meq | INTRAVENOUS | Status: AC
  Administered 2015-10-29 (×2): 10 meq via INTRAVENOUS
  Filled 2015-10-28 (×2): qty 100

## 2015-10-28 MED ORDER — ENOXAPARIN SODIUM 40 MG/0.4ML ~~LOC~~ SOLN
40.0000 mg | Freq: Every day | SUBCUTANEOUS | Status: DC
  Administered 2015-10-29 – 2015-10-31 (×4): 40 mg via SUBCUTANEOUS
  Filled 2015-10-28 (×4): qty 0.4

## 2015-10-28 NOTE — H&P (Signed)
PULMONARY / CRITICAL CARE MEDICINE   Name: Brittany [name] MRN: [id number] DOB: 12-30-1965    ADMISSION DATE:  10/28/2015 CONSULTATION DATE:  10/28/2015  REFERRING MD:  ED, Pfeiffer  CHIEF COMPLAINT:  Unresponsive, overdose  HISTORY OF PRESENT ILLNESS:   50 year old woman with anxiety and bipolar disorder with a history of depression and recurrent overdoses in the past was brought in by EMS after being found unresponsive. History is obtained after reviewing medical reports since no family is available, on EMS arrival she was seated in a chair with snoring respirations and unresponsive. Per her significant other, she took a handful of pills mostly baclofen possibly Lamictal and Klonopin.  She was intubated shortly after arrival to the emergency room with minimal medication. [facility] Review of her chart shows recurrent episodes of overdose last in 07/2015 where she was discharged home after psychiatric evaluation. Prior to this in 05/2015 she was admitted for inpatient behavioral health after a similar overdose. I also note admits for overdose in 03/2015 and 04/2015  PAST MEDICAL HISTORY :  She  has a past medical history of Anxiety; Bipolar 1 disorder (HCC); Bulging lumbar disc; Chronic bronchitis (HCC); Chronic lower back pain; Depression; Facial nerve injury; Facial pain (11/2013); GERD (gastroesophageal reflux disease); Goiter; Head injury; Heart murmur; Hyperlipidemia; Hypertension; Hypothyroidism; Migraine; Ruptured intervertebral disc; Seizures (HCC); and Short-term memory loss.  PAST SURGICAL HISTORY: She  has a past surgical history that includes Tonsillectomy; Abdominal hysterectomy; Cesarean section (2004); and Eye surgery (Right, 11/2013).  Allergies  Allergen Reactions  . Toradol [Ketorolac Tromethamine] Swelling  . Suboxone [Buprenorphine Hcl-Naloxone Hcl] Other (See Comments)    cellulitis   . Trazodone And Nefazodone Other (See Comments)    Bladder infection    No  current facility-administered medications on file prior to encounter.    Current Outpatient Prescriptions on File Prior to Encounter  Medication Sig  . b complex vitamins tablet Take 1 tablet by mouth daily.  . butalbital-acetaminophen-caffeine (FIORICET, ESGIC) 50-325-40 MG tablet Take 1 tablet by mouth 2 (two) times daily as needed for headache.  . Carbamazepine (EQUETRO) 300 MG CP12 Take 1 capsule (300 mg total) by mouth daily.  . Cholecalciferol (VITAMIN D PO) Take 1 tablet by mouth daily. Reported on 05/26/2015  . clonazePAM (KLONOPIN) 1 MG tablet Takes 1 tablet by mouth four times a day  . DULoxetine (CYMBALTA) 60 MG capsule Take 1 capsule (60 mg total) by mouth daily. (Patient taking differently: Take 60 mg by mouth 2 (two) times daily. )  . fluticasone (FLONASE) 50 MCG/ACT nasal spray Place 1 spray into both nostrils daily.  Marland Kitchen. gabapentin (NEURONTIN) 400 MG capsule Take 2 capsules (800 mg total) by mouth 3 (three) times daily.  . hydrOXYzine (ATARAX/VISTARIL) 50 MG tablet Take 1 tablet (50 mg total) by mouth 3 (three) times daily as needed for anxiety (sleep).  Marland Kitchen. levothyroxine (SYNTHROID, LEVOTHROID) 100 MCG tablet Take 100 mcg by mouth daily before breakfast.  . LISINOPRIL PO Take by mouth.  . Multiple Vitamin (MULTIVITAMIN WITH MINERALS) TABS tablet Take 1 tablet by mouth daily.   Marland Kitchen. PROAIR HFA 108 (90 Base) MCG/ACT inhaler Take 2 puffs by mouth every 4 (four) hours as needed.  Marland Kitchen. QVAR 80 MCG/ACT inhaler Take 2 puffs by mouth 2 (two) times daily.  Marland Kitchen. thiamine 100 MG tablet Take 1 tablet (100 mg total) by mouth daily.  . traMADol (ULTRAM) 50 MG tablet Take 50 mg by mouth every 6 (six) hours as needed (pain).  .Marland Kitchen  Vitamin D, Ergocalciferol, (DRISDOL) 50000 units CAPS capsule Take 50,000 Units by mouth.    FAMILY HISTORY:  Her indicated that her mother is alive. She indicated that her father is deceased. She indicated that her sister is alive.    SOCIAL HISTORY: She  reports that she has  quit smoking. Her smoking use included Cigarettes. She has never used smokeless tobacco. She reports that she drinks alcohol. She reports that she does not use drugs.  REVIEW OF SYSTEMS:   Unable to obtain    VITAL SIGNS: BP (!) 140/101   Pulse 74   Temp (!) 94.5 F (34.7 C)   Resp 18   Ht 5\' 5"  (1.651 m)   SpO2 92%   HEMODYNAMICS:    VENTILATOR SETTINGS: Vent Mode: PRVC FiO2 (%):  [100 %] 100 % Set Rate:  [18 bmp] 18 bmp Vt Set:  [460 mL] 460 mL PEEP:  [5 cmH20] 5 cmH20 Plateau Pressure:  [18 cmH20] 18 cmH20  INTAKE / OUTPUT: No intake/output data recorded.  PHYSICAL EXAMINATION: Gen. , obese, in no distress, orally intubated ENT - no lesions, no post nasal drip, class 2-3 airway Neck: No JVD, no thyromegaly, no carotid bruits Lungs: no use of accessory muscles, no dullness to percussion, decreased without rales or rhonchi  Cardiovascular: Rhythm regular, heart sounds  normal, no murmurs or gallops, no peripheral edema Abdomen: soft and non-tender, no hepatosplenomegaly, BS normal. Musculoskeletal: No deformities, no cyanosis or clubbing Neuro:  Unresponsive, non focal, no tremors, pupils 4 mm not reactive to light  LABS:  BMET  Recent Labs Lab 10/28/15 2110 10/28/15 2124  NA 139 140  K 3.5 3.5  CL 105 102  CO2 25  --   BUN 9 8  CREATININE 0.71 0.90  GLUCOSE 97 106*    Electrolytes  Recent Labs Lab 10/28/15 2110  CALCIUM 8.8*    CBC  Recent Labs Lab 10/28/15 2110 10/28/15 2124  WBC 9.7  --   HGB 13.6 14.3  HCT 39.7 42.0  PLT 283  --     Coag's  Recent Labs Lab 10/28/15 2110  INR 0.90    Sepsis Markers  Recent Labs Lab 10/28/15 2123 10/28/15 2126  LATICACIDVEN 4.70* 1.54    ABG  Recent Labs Lab 10/28/15 2158  PHART 7.355  PCO2ART 42.9  PO2ART 230*    Liver Enzymes  Recent Labs Lab 10/28/15 2110  AST 21  ALT 24  ALKPHOS 74  BILITOT 0.4  ALBUMIN 4.5    Cardiac Enzymes  Recent Labs Lab 10/28/15 2110   TROPONINI <0.03    Glucose  Recent Labs Lab 10/28/15 2126  GLUCAP 99    Imaging No results found.   STUDIES:    CULTURES:   ANTIBIOTICS:   SIGNIFICANT EVENTS:   LINES/TUBES: ETT 9/14 >>  DISCUSSION: Recurrent overdose -this time appears to be baclofen possibly with Lamictal and Klonopin, UDS surprisingly positive for cocaine The patient is noted to have a lactate>4. With the current information available to me, I don't think the patient is in septic shock. The lactate>4, is related to drug / toxin overdose.  ASSESSMENT / PLAN:  PULMONARY A: Acute respiratory failure-intubated for airway protection P:   Vent settings reviewed and adjusted Chest x-ray and ABG reviewed  CARDIOVASCULAR A:  Hypertension Lactic acidosis-resolved P:  Hold lisinopril Use hydralazine when necessary  RENAL A:   Hypokalemia At risk AK I P:   Replete lites as indicated  GASTROINTESTINAL A:   No issues  P:   Nothing by mouth  HEMATOLOGIC A:   No issues P:  Subcutaneous Lovenox for DVT prophylaxis  INFECTIOUS A:   At risk aspiration P:   Follow fever and white count  ENDOCRINE A:   No issues   P:   Follow CBG  NEUROLOGIC A:   Acute encephalopathy, related to overdose Substance abuse Depression-suicidal intent P:   RASS goal: 0 PAD protocol with intermittent Fent and versed as needed - will definitely need psychiatric evaluation and inpatient behavioral health Hold home medications for now including carbamazepine   FAMILY  - Updates: None at bedside  - Inter-disciplinary family meet or Palliative Care meeting due by:  NA  My cc time x 45 m  Cyril Mourning MD. [facility]. [place] [facility] Pager [phone] If no response call [phone]    10/28/2015, 11:48 PM

## 2015-10-28 NOTE — ED Provider Notes (Signed)
[facility]-EMERGENCY DEPT Provider Note   CSN: [id number] Arrival date & time: 10/28/15  2051     History   Chief Complaint Chief Complaint  Patient presents with  . Ingestion    HPI Brittany [name] is a 50 y.o. female.  HPI All history is from EMS. The patient arrives obtunded. Reportedly patient was having an disagreement with her significant other and he reports that she took a "handful of pills". Most of these are reportedly baclofen. Possibly also some Lamictal and Klonopin. EMS reports that on their arrival the patient was seated in a chair with sonorous respirations and not responsive. They placed a nasal trumpet, gave supplemental oxygen and established peripheral IV. The patient reportedly has a history of prior attempts at suicide. Past Medical History:  Diagnosis Date  . Anxiety   . Bipolar 1 disorder (HCC)   . Bulging lumbar disc   . Chronic bronchitis (HCC)   . Chronic lower back pain   . Depression   . Facial nerve injury   . Facial pain 11/2013   walking dog when she tripped and "faceplanted" landing on her face.   Marland Kitchen GERD (gastroesophageal reflux disease)   . Goiter   . Head injury   . Heart murmur   . Hyperlipidemia   . Hypertension   . Hypothyroidism   . Migraine    "weekly" (03/23/2015)  . Ruptured intervertebral disc   . Seizures (HCC)    "I've had a few; had a grand mal when I took Neurotin; still take Neurotin" (03/23/2015)  . Short-term memory loss     Patient Active Problem List   Diagnosis Date Noted  . Severe episode of recurrent major depressive disorder, without psychotic features (HCC)   . Acute respiratory failure with hypoxemia (HCC)   . Bipolar I disorder, most recent episode mixed (HCC) 06/14/2015  . Essential hypertension   . Sinus bradycardia 06/10/2015  . Prolonged QT interval 05/03/2015  . Morbid obesity due to excess calories (HCC)   . GERD (gastroesophageal reflux disease)   . Migraine without aura and without status migrainosus, not  intractable   . Overdose 03/23/2015  . History of hepatitis C virus infection 02/26/2015  . Facial nerve injury   . Right facial pain 01/05/2015  . Polypharmacy 01/18/2012    Class: Chronic  . Borderline personality disorder 01/18/2012  . Hypothyroidism 09/16/2011    Past Surgical History:  Procedure Laterality Date  . ABDOMINAL HYSTERECTOMY    . CESAREAN SECTION  2004  . EYE SURGERY Right 11/2013   emergent lateral canthotomy due to proptosis  [name] 11/30/2013  . TONSILLECTOMY      OB History    No data available       Home Medications    Prior to Admission medications   Medication Sig Start Date End Date Taking? Authorizing Provider  b complex vitamins tablet Take 1 tablet by mouth daily.    Historical Provider, MD  butalbital-acetaminophen-caffeine (FIORICET, ESGIC) 50-325-40 MG tablet Take 1 tablet by mouth 2 (two) times daily as needed for headache.    Historical Provider, MD  Carbamazepine (EQUETRO) 300 MG CP12 Take 1 capsule (300 mg total) by mouth daily. 06/15/15   Shari Prows, MD  Cholecalciferol (VITAMIN D PO) Take 1 tablet by mouth daily. Reported on 05/26/2015    Historical Provider, MD  clonazePAM Scarlette Calico) 1 MG tablet Takes 1 tablet by mouth four times a day 04/14/15   Historical Provider, MD  DULoxetine (CYMBALTA) 60 MG capsule Take  1 capsule (60 mg total) by mouth daily. Patient taking differently: Take 60 mg by mouth 2 (two) times daily.  05/03/15   Oneta Rack, NP  fluticasone (FLONASE) 50 MCG/ACT nasal spray Place 1 spray into both nostrils daily.    Historical Provider, MD  gabapentin (NEURONTIN) 400 MG capsule Take 2 capsules (800 mg total) by mouth 3 (three) times daily. 06/14/15   Nishant Dhungel, MD  hydrOXYzine (ATARAX/VISTARIL) 50 MG tablet Take 1 tablet (50 mg total) by mouth 3 (three) times daily as needed for anxiety (sleep). 06/15/15   Shari Prows, MD  levothyroxine (SYNTHROID, LEVOTHROID) 100 MCG tablet Take 100 mcg by mouth daily  before breakfast.    Historical Provider, MD  LISINOPRIL PO Take by mouth.    Historical Provider, MD  Multiple Vitamin (MULTIVITAMIN WITH MINERALS) TABS tablet Take 1 tablet by mouth daily.     Historical Provider, MD  PROAIR HFA 108 [phone] Base) MCG/ACT inhaler Take 2 puffs by mouth every 4 (four) hours as needed. 05/21/15   Historical Provider, MD  QVAR 80 MCG/ACT inhaler Take 2 puffs by mouth 2 (two) times daily. 05/21/15   Historical Provider, MD  thiamine 100 MG tablet Take 1 tablet (100 mg total) by mouth daily. 04/27/15   Vassie Loll, MD  traMADol (ULTRAM) 50 MG tablet Take 50 mg by mouth every 6 (six) hours as needed (pain).    Historical Provider, MD  Vitamin D, Ergocalciferol, (DRISDOL) 50000 units CAPS capsule Take 50,000 Units by mouth.    Historical Provider, MD    Family History Family History  Problem Relation Age of Onset  . Migraines Mother   . Migraines Father   . Other Father   . Migraines Sister     Social History Social History  Substance Use Topics  . Smoking status: Former Smoker    Types: Cigarettes  . Smokeless tobacco: Never Used     Comment: "smoked cigarettes when I was 50 years old"  . Alcohol use Yes     Comment: 03/23/2015 "might drink at Christmastime"     Allergies   Toradol [ketorolac tromethamine]; Suboxone [buprenorphine hcl-naloxone hcl]; and Trazodone and nefazodone   Review of Systems Review of Systems Cannot obtain review of systems due to patient condition. Level V caveat obtundation.  Physical Exam Updated Vital Signs BP (!) 160/110   Pulse 79   Temp (!) 94.6 F (34.8 C)   Resp 18   Ht 5\' 5"  (1.651 m)   SpO2 100%   Physical Exam  Constitutional:  The patient is obtunded with spontaneous but shallow respirations. Color is good. She is profusely diaphoretic. Patient is moderately obese.  HENT:  Head: Normocephalic and atraumatic.  Right Ear: External ear normal.  Left Ear: External ear normal.  Nose: Nose normal.  Mouth/Throat:  Oropharynx is clear and moist.  Nasal trumpet is in place. There is no bleeding.  Eyes:  Pupils are symmetric and widely dilated to 10 mm.  Neck: Neck supple.  No stridor.  Cardiovascular: Normal rate, regular rhythm, normal heart sounds and intact distal pulses.   Pulmonary/Chest:  Patient has shallow respirations. No gross wheeze rhonchi rail. Airway is clear.  Abdominal: Soft. She exhibits no distension.  Musculoskeletal: She exhibits no edema or deformity.  Neurological:  Patient is obtunded. She is not making response to pain. No spontaneous movements or vocalizations.  Skin: Skin is warm.  Patient's skin is warm with normal color. Face and chest are very diaphoretic.  ED Treatments / Results  Labs (all labs ordered are listed, but only abnormal results are displayed) Labs Reviewed  ACETAMINOPHEN LEVEL - Abnormal; Notable for the following:       Result Value   Acetaminophen (Tylenol), Serum <10 (*)    All other components within normal limits  COMPREHENSIVE METABOLIC PANEL - Abnormal; Notable for the following:    Calcium 8.8 (*)    Total Protein 8.6 (*)    All other components within normal limits  ETHANOL - Abnormal; Notable for the following:    Alcohol, Ethyl (B) 151 (*)    All other components within normal limits  URINE RAPID DRUG SCREEN, HOSP PERFORMED - Abnormal; Notable for the following:    Cocaine POSITIVE (*)    Benzodiazepines POSITIVE (*)    All other components within normal limits  BLOOD GAS, ARTERIAL - Abnormal; Notable for the following:    pO2, Arterial 230 (*)    All other components within normal limits  I-STAT CHEM 8, ED - Abnormal; Notable for the following:    Glucose, Bld 106 (*)    Calcium, Ion 1.14 (*)    All other components within normal limits  I-STAT CG4 LACTIC ACID, ED - Abnormal; Notable for the following:    Lactic Acid, Venous 4.70 (*)    All other components within normal limits  SALICYLATE LEVEL  TROPONIN I  CBC WITH  DIFFERENTIAL/PLATELET  PROTIME-INR  URINALYSIS, ROUTINE W REFLEX MICROSCOPIC (NOT AT [facility])  CBG MONITORING, ED  I-STAT BETA HCG BLOOD, ED ([facility], [facility], [facility] ONLY)  I-STAT CG4 LACTIC ACID, ED    EKG  EKG Interpretation None       Radiology No results found.  Procedures .Intubation Date/Time: 10/28/2015 11:29 PM Performed by: Arby BarrettePFEIFFER, Demika Langenderfer Authorized by: Arby BarrettePFEIFFER, Wrigley Plasencia   Consent:    Consent obtained:  Emergent situation Pre-procedure details:    Patient status:  Unresponsive   Mallampati score:  III   Pretreatment medications:  None   Paralytics:  Succinylcholine Procedure details:    Preoxygenation:  Nonrebreather mask   Intubation method:  Oral   Oral intubation technique:  Video-assisted   Laryngoscope blade:  Mac 2   Tube size (mm):  7.5   Tube type:  Cuffed   Number of attempts:  1   Tube visualized through cords: yes   Placement assessment:    ETT to lip:  21   Tube secured with:  ETT holder   Breath sounds:  Equal   Placement verification: chest rise, equal breath sounds, ETCO2 detector and fiberoptic scope     CXR findings:  ETT in proper place Post-procedure details:    Patient tolerance of procedure:  Tolerated well, no immediate complications Comments:     Intubation without complications. No emesis or significant pooling of secretions in the airway. Patient intubated upon first attempt without oxygen desaturation.    (including critical care time) CRITICAL CARE Performed by: Arby BarrettePfeiffer, Landree Fernholz   Total critical care time: 30 minutes  Critical care time was exclusive of separately billable procedures and treating other patients.  Critical care was necessary to treat or prevent imminent or life-threatening deterioration.  Critical care was time spent personally by me on the following activities: development of treatment plan with patient and/or surrogate as well as nursing, discussions with consultants, evaluation of patient's response to treatment,  examination of patient, obtaining history from patient or surrogate, ordering and performing treatments and interventions, ordering and review of laboratory studies, ordering and review of  radiographic studies, pulse oximetry and re-evaluation of patient's condition.  Medications Ordered in ED Medications  0.9 %  sodium chloride infusion ( Intravenous New Bag/Given 10/28/15 2148)  succinylcholine (ANECTINE) injection (120 mg Intravenous Given 10/28/15 2133)     Initial Impression / Assessment and Plan / ED Course  I have reviewed the triage vital signs and the nursing notes.  Pertinent labs & imaging results that were available during my care of the patient were reviewed by me and considered in my medical decision making (see chart for details).  Clinical Course  Consult: Intensivist for admission.  Final Clinical Impressions(s) / ED Diagnoses   Final diagnoses:  Encounter for orogastric tube placement  Overdose, intentional self-harm, initial encounter [facility])   Patient arrived obtunded. Medics had found the patient in this condition and at no point has she become verbal. Report is for intentional overdose. Patient was intubated to protect her airway. New Prescriptions New Prescriptions   No medications on file     Arby Barrette, MD 10/28/15 2336

## 2015-10-28 NOTE — ED Triage Notes (Signed)
Per EMS pt took a handful of medication at home about an hour prior to their arrival  Pt's family states pt took baclofen and possibly klonapin and lamictal  Pt has had some beer today  Upon EMS arrival pt was sitting up in a chair with snoring respirations  IV started with a #20 left hand, monitor reading ST in the 90s, nasal trumpet was placed and oxygen applied via NRBM  Pt diaphoretic upon arrival

## 2015-10-29 ENCOUNTER — Inpatient Hospital Stay (HOSPITAL_COMMUNITY): Payer: Medicaid Other

## 2015-10-29 DIAGNOSIS — T428X4D Poisoning by antiparkinsonism drugs and other central muscle-tone depressants, undetermined, subsequent encounter: Secondary | ICD-10-CM

## 2015-10-29 LAB — GLUCOSE, CAPILLARY: Glucose-Capillary: 113 mg/dL — ABNORMAL HIGH (ref 65–99)

## 2015-10-29 LAB — PHOSPHORUS: PHOSPHORUS: 3.9 mg/dL (ref 2.5–4.6)

## 2015-10-29 LAB — CBC
HCT: 38.3 % (ref 36.0–46.0)
HEMOGLOBIN: 12.5 g/dL (ref 12.0–15.0)
MCH: 30 pg (ref 26.0–34.0)
MCHC: 32.6 g/dL (ref 30.0–36.0)
MCV: 91.8 fL (ref 78.0–100.0)
Platelets: 282 10*3/uL (ref 150–400)
RBC: 4.17 MIL/uL (ref 3.87–5.11)
RDW: 14.2 % (ref 11.5–15.5)
WBC: 7.5 10*3/uL (ref 4.0–10.5)

## 2015-10-29 LAB — BASIC METABOLIC PANEL
Anion gap: 8 (ref 5–15)
BUN: 9 mg/dL (ref 6–20)
CHLORIDE: 106 mmol/L (ref 101–111)
CO2: 25 mmol/L (ref 22–32)
CREATININE: 0.69 mg/dL (ref 0.44–1.00)
Calcium: 8.4 mg/dL — ABNORMAL LOW (ref 8.9–10.3)
GFR calc Af Amer: >60 mL/min (ref >60)
GFR calc non Af Amer: >60 mL/min (ref >60)
Glucose, Bld: 98 mg/dL (ref 65–99)
POTASSIUM: 3.9 mmol/L (ref 3.5–5.1)
SODIUM: 139 mmol/L (ref 135–145)

## 2015-10-29 LAB — MRSA PCR SCREENING: MRSA BY PCR: NEGATIVE

## 2015-10-29 LAB — PROCALCITONIN: Procalcitonin: <0.1 ng/mL

## 2015-10-29 LAB — MAGNESIUM: MAGNESIUM: 1.9 mg/dL (ref 1.7–2.4)

## 2015-10-29 MED ORDER — SODIUM CHLORIDE 0.9 % IV BOLUS (SEPSIS)
500.0000 mL | Freq: Once | INTRAVENOUS | Status: AC
  Administered 2015-10-29: 500 mL via INTRAVENOUS

## 2015-10-29 MED ORDER — CHLORHEXIDINE GLUCONATE 0.12% ORAL RINSE (MEDLINE KIT)
15.0000 mL | Freq: Two times a day (BID) | OROMUCOSAL | Status: DC
  Administered 2015-10-29 – 2015-10-31 (×6): 15 mL via OROMUCOSAL

## 2015-10-29 MED ORDER — ORAL CARE MOUTH RINSE
15.0000 mL | Freq: Four times a day (QID) | OROMUCOSAL | Status: DC
  Administered 2015-10-29 – 2015-10-30 (×6): 15 mL via OROMUCOSAL

## 2015-10-29 NOTE — Progress Notes (Signed)
PULMONARY / CRITICAL CARE MEDICINE   Name: Brittany [name] MRN: [id number] DOB: 1965/03/05    ADMISSION DATE:  10/28/2015 CONSULTATION DATE:  10/29/2015  REFERRING MD:  ED, Pfeiffer  CHIEF COMPLAINT:  Unresponsive, overdose  HISTORY OF PRESENT ILLNESS:   50 year old woman with anxiety and bipolar disorder with a history of depression and recurrent overdoses in the past was brought in by EMS after being found unresponsive. History is obtained after reviewing medical reports since no family is available, on EMS arrival she was seated in a chair with snoring respirations and unresponsive. Per her significant other, she took a handful of pills mostly baclofen possibly Lamictal and Klonopin.  She was intubated shortly after arrival to the emergency room with minimal medication. [facility] Review of her chart shows recurrent episodes of overdose last in 07/2015 where she was discharged home after psychiatric evaluation. Prior to this in 05/2015 she was admitted for inpatient behavioral health after a similar overdose. I also note admits for overdose in 03/2015 and 04/2015  PAST MEDICAL HISTORY :  She  has a past medical history of Anxiety; Bipolar 1 disorder (HCC); Bulging lumbar disc; Chronic bronchitis (HCC); Chronic lower back pain; Depression; Facial nerve injury; Facial pain (11/2013); GERD (gastroesophageal reflux disease); Goiter; Head injury; Heart murmur; Hyperlipidemia; Hypertension; Hypothyroidism; Migraine; Ruptured intervertebral disc; Seizures (HCC); and Short-term memory loss.  PAST SURGICAL HISTORY: She  has a past surgical history that includes Tonsillectomy; Abdominal hysterectomy; Cesarean section (2004); and Eye surgery (Right, 11/2013).  Allergies  Allergen Reactions  . Toradol [Ketorolac Tromethamine] Swelling  . Suboxone [Buprenorphine Hcl-Naloxone Hcl] Other (See Comments)    cellulitis   . Trazodone And Nefazodone Other (See Comments)    Bladder infection    No  current facility-administered medications on file prior to encounter.    Current Outpatient Prescriptions on File Prior to Encounter  Medication Sig  . b complex vitamins tablet Take 1 tablet by mouth daily.  . butalbital-acetaminophen-caffeine (FIORICET, ESGIC) 50-325-40 MG tablet Take 1 tablet by mouth 2 (two) times daily as needed for headache.  . Carbamazepine (EQUETRO) 300 MG CP12 Take 1 capsule (300 mg total) by mouth daily.  . Cholecalciferol (VITAMIN D PO) Take 1 tablet by mouth daily. Reported on 05/26/2015  . clonazePAM (KLONOPIN) 1 MG tablet Takes 1 tablet by mouth four times a day  . DULoxetine (CYMBALTA) 60 MG capsule Take 1 capsule (60 mg total) by mouth daily. (Patient taking differently: Take 60 mg by mouth 2 (two) times daily. )  . fluticasone (FLONASE) 50 MCG/ACT nasal spray Place 1 spray into both nostrils daily.  Marland Kitchen. gabapentin (NEURONTIN) 400 MG capsule Take 2 capsules (800 mg total) by mouth 3 (three) times daily.  . hydrOXYzine (ATARAX/VISTARIL) 50 MG tablet Take 1 tablet (50 mg total) by mouth 3 (three) times daily as needed for anxiety (sleep).  Marland Kitchen. levothyroxine (SYNTHROID, LEVOTHROID) 100 MCG tablet Take 100 mcg by mouth daily before breakfast.  . LISINOPRIL PO Take by mouth.  . Multiple Vitamin (MULTIVITAMIN WITH MINERALS) TABS tablet Take 1 tablet by mouth daily.   Marland Kitchen. PROAIR HFA 108 (90 Base) MCG/ACT inhaler Take 2 puffs by mouth every 4 (four) hours as needed.  Marland Kitchen. QVAR 80 MCG/ACT inhaler Take 2 puffs by mouth 2 (two) times daily.  Marland Kitchen. thiamine 100 MG tablet Take 1 tablet (100 mg total) by mouth daily.  . traMADol (ULTRAM) 50 MG tablet Take 50 mg by mouth every 6 (six) hours as needed (pain).  .Marland Kitchen  Vitamin D, Ergocalciferol, (DRISDOL) 50000 units CAPS capsule Take 50,000 Units by mouth.    FAMILY HISTORY:  Her indicated that her mother is alive. She indicated that her father is deceased. She indicated that her sister is alive.    SOCIAL HISTORY: She  reports that she has  quit smoking. Her smoking use included Cigarettes. She has never used smokeless tobacco. She reports that she drinks alcohol. She reports that she does not use drugs.  REVIEW OF SYSTEMS:   Unable to obtain    VITAL SIGNS: BP (!) 82/44   Pulse 63   Temp 98.4 F (36.9 C)   Resp 15   Ht 5\' 5"  (1.651 m)   Wt 222 lb 0.1 oz (100.7 kg)   SpO2 95%   BMI 36.94 kg/m   HEMODYNAMICS:    VENTILATOR SETTINGS: Vent Mode: PRVC FiO2 (%):  [30 %-100 %] 30 % Set Rate:  [15 bmp-18 bmp] 15 bmp Vt Set:  [460 mL] 460 mL PEEP:  [5 cmH20] 5 cmH20 Plateau Pressure:  [16 cmH20-18 cmH20] 16 cmH20  INTAKE / OUTPUT: I/O last 3 completed shifts: In: 957.1 [I.V.:757.1; IV Piggyback:200] Out: 1375 [Urine:925; Emesis/NG output:450]  PHYSICAL EXAMINATION: Gen. No distress.\ Neuro:  Unresponsive, non focal, no tremors, pupils 4 mm not reactive to light HENT - Oral ETT, moist mucus membranes, No thyromegaly, JVD Cardiovascular: Rhythm regular, heart sounds  normal, No MRG Lungs: no use of accessory muscles, no dullness to percussion, decreased without rales or rhonchi  Abdomen: soft and non-tender, no hepatosplenomegaly, BS normal. Musculoskeletal: No deformities, no cyanosis or clubbing, no edema Skin: Intact  LABS:  BMET  Recent Labs Lab 10/28/15 2110 10/28/15 2124 10/29/15 0319  NA 139 140 139  K 3.5 3.5 3.9  CL 105 102 106  CO2 25  --  25  BUN 9 8 9   CREATININE 0.71 0.90 0.69  GLUCOSE 97 106* 98    Electrolytes  Recent Labs Lab 10/28/15 2110 10/29/15 0319  CALCIUM 8.8* 8.4*  MG  --  1.9  PHOS  --  3.9    CBC  Recent Labs Lab 10/28/15 2110 10/28/15 2124 10/29/15 0319  WBC 9.7  --  7.5  HGB 13.6 14.3 12.5  HCT 39.7 42.0 38.3  PLT 283  --  282    Coag's  Recent Labs Lab 10/28/15 2110  INR 0.90    Sepsis Markers  Recent Labs Lab 10/28/15 2123 10/28/15 2126  LATICACIDVEN 4.70* 1.54    ABG  Recent Labs Lab 10/28/15 2158  PHART 7.355  PCO2ART 42.9   PO2ART 230*    Liver Enzymes  Recent Labs Lab 10/28/15 2110  AST 21  ALT 24  ALKPHOS 74  BILITOT 0.4  ALBUMIN 4.5    Cardiac Enzymes  Recent Labs Lab 10/28/15 2110  TROPONINI <0.03    Glucose  Recent Labs Lab 10/28/15 2126 10/29/15 0347  GLUCAP 99 113*    Imaging Dg Chest Port 1 View  Result Date: 10/29/2015 CLINICAL DATA:  Encounter for OG tube placement. EXAM: PORTABLE CHEST 1 VIEW COMPARISON:  Chest radiograph 08/04/2015 FINDINGS: Endotracheal tube is 5.3 cm from the carina. Enteric tube in place, tip and side port below the diaphragm in the stomach. Lung volumes are low. The heart size and mediastinal contours are unchanged with probable mild cardiomegaly. Ill-defined right infrahilar, retrocardiac, and left suprahilar opacities. Difficult to exclude small left pleural effusion. No evidence pneumothorax. No acute osseous abnormality is seen. IMPRESSION: 1. Endotracheal tube 5.3 cm  from the carina. Tip and side port of the enteric tube below the diaphragm in the stomach. 2. Multifocal ill-defined opacities right infrahilar, retrocardiac, and left suprahilar regions. This may be atelectasis, aspiration or pneumonia. Electronically Signed   By: Rubye Oaks M.D.   On: 10/29/2015 00:12     STUDIES:    CULTURES:   ANTIBIOTICS:   SIGNIFICANT EVENTS:   LINES/TUBES: ETT 9/14 >>  DISCUSSION: Recurrent overdose -this time appears to be baclofen possibly with Lamictal and Klonopin, UDS positive for cocaine.    ASSESSMENT / PLAN:  PULMONARY A: Acute respiratory failure-intubated for airway protection P:   Follow CXR and ABG Waiting for mental status to improve before weaning  CARDIOVASCULAR A:  Hypertension Lactic acidosis-resolved P:  Hold lisinopril Use hydralazine when necessary  RENAL A:   Hypokalemia At risk AK I P:   Replete lytes as indicated  GASTROINTESTINAL A:   No issues P:   Start feeds  HEMATOLOGIC A:   No issues P:   Subcutaneous Lovenox for DVT prophylaxis  INFECTIOUS A:   At risk aspiration P:   Follow fever and white count. Observe off antibiotics. Follow Pct  ENDOCRINE A:   No issues   P:   Follow CBG  NEUROLOGIC A:   Acute encephalopathy, related to overdose Substance abuse Depression-suicidal intent P:   RASS goal: 0 PAD protocol with intermittent Fent and versed as needed Psychiatric evaluation and inpatient behavioral health after extubation Hold home medications for now including carbamazepine  FAMILY  - Updates: None at bedside - Inter-disciplinary family meet or Palliative Care meeting due by: 9/21  Critical care time- 40 mins  Chilton Greathouse MD [facility] Pager [phone] If no answer or after 3pm call: [phone] 10/29/2015, 9:13 AM

## 2015-10-29 NOTE — Progress Notes (Signed)
eLink Physician-Brief Progress Note Patient Name: Brittany [name] DOB: [birth year] MRN: [id number]   Date of Service  10/29/2015  HPI/Events of Note  Notified by bedside nurse that patient has ongoing oliguric urine output. Reportedly Foley is functioning. Reportedly pupils are symmetric but slow. Patient still unresponsive but does seem to have a cough/gag reflex. UDS positive for cocaine.   eICU Interventions  1. Bladder scan to determine if there is urine being obstructed by Foley 2. Stat CT head without contrast      Intervention Category Major Interventions: Other:  Lawanda CousinsJennings Serin Thornell 10/29/2015, 3:53 PM

## 2015-10-29 NOTE — Progress Notes (Signed)
Initial Nutrition Assessment  DOCUMENTATION CODES:   Obesity unspecified  INTERVENTION:  - If pt to remain intubated >/= 24 hours, recommend Vital High Protein @ 55 mL/hr to provide 1320 kcal, 116 grams of protein, and 1104 mL free water.  - RD will follow-up 9/18.  NUTRITION DIAGNOSIS:   Inadequate oral intake related to inability to eat as evidenced by NPO status.   GOAL:   Provide needs based on ASPEN/SCCM guidelines  MONITOR:   Vent status, Weight trends, Labs, I & O's  REASON FOR ASSESSMENT:   Ventilator, Low Braden  ASSESSMENT:   50 year old woman with anxiety and bipolar disorder with a history of depression and recurrent overdoses in the past was brought in by EMS after being found unresponsive. On EMS arrival she was seated in a chair with snoring respirations and unresponsive. Per her significant other, she took a handful of pills mostly baclofen possibly Lamictal and Klonopin. She was intubated shortly after arrival to the emergency room with minimal medication[facility]  BMI indicates obesity. No family/visitors present at this time. OGT in place and noted canister on the wall was completely full; RN reports that this drainage was from overnight and that OGT is currently clamped and medications were given shortly before RD visit.   Physical assessment shows no muscle or fat wasting, no edema. Per chart review, pt has lost 10 lbs (4.3% body weight) in the past 3 months which is not significant for time frame. Will monitor weight trends during hospitalization.   Patient is currently intubated on ventilator support MV: 7.2 L/min Temp (24hrs), Avg:96 F (35.6 C), Min:94.1 F (34.5 C), Max:98.6 F (37 C) Propofol: none   Medications reviewed; 40 mg Protonix via OGT/day, 140 mEq IV KCl x2 runs today.  Labs reviewed; CBG: 113 mg/dL this AM, Ca: 8.4 mg/dL. IVF: D5-NS @ 75 mL/hr (306 kcal).       Diet Order:  Diet NPO time specified  Skin:   Reviewed, no issues  Last BM:  PTA  Height:   Ht Readings from Last 1 Encounters:  10/29/15 5\' 5"  (1.651 m)    Weight:   Wt Readings from Last 1 Encounters:  10/29/15 222 lb 0.1 oz (100.7 kg)    Ideal Body Weight:  56.82 kg  BMI:  Body mass index is 36.94 kg/m.  Estimated Nutritional Needs:   Kcal:  1108-1410 (11-14 kcal/kg actual body weight)  Protein:  114 grams (2 grams/kg IBW)  Fluid:  1.2-1.5 L/day  EDUCATION NEEDS:   No education needs identified at this time    Trenton GammonJessica Waver Dibiasio, MS, RD, LDN Inpatient Clinical Dietitian Pager # [phone] After hours/weekend pager # [phone]

## 2015-10-29 NOTE — Care Management Note (Signed)
Case Management Note  Patient Details  Name: [name] MRN: [id number] Date of Birth: 1965-03-11  Subjective/Objective:          resp dyspnea requiring intubation          Action/Plan: tbd  Expected Discharge Date:                  Expected Discharge Plan:     In-House Referral:     Discharge planning Services     Post Acute Care Choice:    Choice offered to:     DME Arranged:    DME Agency:     HH Arranged:    HH Agency:     Status of Service:  In process, will continue to follow  If discussed at Long Length of Stay Meetings, dates discussed:    Additional Comments: Date:  October 29, 2015 Chart reviewed for concurrent status and case management needs. Will continue to follow the patient for status change: Discharge Planning: following for needs Expected discharge date: [id number] Marcelle SmilingRhonda Rosia Syme, BSN, [place]3, [place]   [phone] Golda Acreavis, Baine Decesare Lynn, RN 10/29/2015, 10:22 AM

## 2015-10-29 NOTE — Progress Notes (Addendum)
Significant other called, with password, agitated and very upset, he sated unable to financially make it to the hospital, they've been kicked out of their home due to city code violations. He said that Selena BattenKim was not trying to kill herself, "she loves her daughter and she is smart and attractive and has a lot to live for and is not suicidal she is just a drug addict, she had some beers and her mood changed and she became angry and swallowed pills." He sated she needs brain surgery and keeps sabotaging preoperative appointments by over dosing. He was very emotional and paranoid talking in circles about Selena BattenKim getting adequate care and how traumatized his dog is and questioning why he was treated like a suspect when the police told him their hotel room was a crime scene. Family was reassured and that we would get [name] adequate care and set up with resources prior to discharge.

## 2015-10-30 ENCOUNTER — Inpatient Hospital Stay (HOSPITAL_COMMUNITY): Payer: Medicaid Other

## 2015-10-30 DIAGNOSIS — J9601 Acute respiratory failure with hypoxia: Secondary | ICD-10-CM

## 2015-10-30 LAB — CBC
HEMATOCRIT: 38.9 % (ref 36.0–46.0)
Hemoglobin: 12.4 g/dL (ref 12.0–15.0)
MCH: 30.5 pg (ref 26.0–34.0)
MCHC: 31.9 g/dL (ref 30.0–36.0)
MCV: 95.6 fL (ref 78.0–100.0)
PLATELETS: 234 10*3/uL (ref 150–400)
RBC: 4.07 MIL/uL (ref 3.87–5.11)
RDW: 14.9 % (ref 11.5–15.5)
WBC: 6.8 10*3/uL (ref 4.0–10.5)

## 2015-10-30 LAB — BLOOD GAS, ARTERIAL
Acid-Base Excess: 2.6 mmol/L — ABNORMAL HIGH (ref 0.0–2.0)
Bicarbonate: 28.8 mmol/L — ABNORMAL HIGH (ref 20.0–28.0)
Drawn by: 235321
FIO2: 30
O2 Saturation: 95.6 %
PCO2 ART: 54 mmHg — AB (ref 32.0–48.0)
PEEP: 5 cmH2O
Patient temperature: 98.6
RATE: 15 resp/min
VT: 460 mL
pH, Arterial: 7.347 — ABNORMAL LOW (ref 7.350–7.450)
pO2, Arterial: 82.1 mmHg — ABNORMAL LOW (ref 83.0–108.0)

## 2015-10-30 LAB — PROCALCITONIN

## 2015-10-30 MED ORDER — LAMOTRIGINE 100 MG PO TABS
100.0000 mg | ORAL_TABLET | Freq: Two times a day (BID) | ORAL | Status: DC
  Administered 2015-10-31 – 2015-11-03 (×7): 100 mg via ORAL
  Filled 2015-10-30 (×9): qty 1

## 2015-10-30 MED ORDER — HYDRALAZINE HCL 20 MG/ML IJ SOLN
10.0000 mg | INTRAMUSCULAR | Status: DC | PRN
  Administered 2015-10-30 – 2015-11-01 (×4): 20 mg via INTRAVENOUS
  Filled 2015-10-30 (×5): qty 1

## 2015-10-30 MED ORDER — DULOXETINE HCL 30 MG PO CPEP
60.0000 mg | ORAL_CAPSULE | Freq: Every day | ORAL | Status: DC
  Administered 2015-10-31 – 2015-11-03 (×4): 60 mg via ORAL
  Filled 2015-10-30 (×5): qty 2

## 2015-10-30 MED ORDER — CARBAMAZEPINE ER 300 MG PO CP12: 300.0000 mg | ORAL_CAPSULE | Freq: Every day | ORAL | Status: DC

## 2015-10-30 MED ORDER — LEVOTHYROXINE SODIUM 100 MCG PO TABS
100.0000 ug | ORAL_TABLET | Freq: Every day | ORAL | Status: DC
  Administered 2015-10-31 – 2015-11-03 (×4): 100 ug via ORAL
  Filled 2015-10-30 (×4): qty 1

## 2015-10-30 MED ORDER — CARBAMAZEPINE ER 100 MG PO CP12
300.0000 mg | ORAL_CAPSULE | Freq: Two times a day (BID) | ORAL | Status: DC
  Administered 2015-10-31 – 2015-11-02 (×5): 300 mg via ORAL
  Filled 2015-10-30 (×9): qty 3

## 2015-10-30 MED ORDER — HYDRALAZINE HCL 20 MG/ML IJ SOLN
INTRAMUSCULAR | Status: AC
  Filled 2015-10-30: qty 1

## 2015-10-30 MED ORDER — CARISOPRODOL 350 MG PO TABS
350.0000 mg | ORAL_TABLET | Freq: Two times a day (BID) | ORAL | Status: DC
  Administered 2015-10-31 – 2015-11-03 (×7): 350 mg via ORAL
  Filled 2015-10-30 (×7): qty 1

## 2015-10-30 MED ORDER — ORAL CARE MOUTH RINSE
15.0000 mL | Freq: Two times a day (BID) | OROMUCOSAL | Status: DC
  Administered 2015-10-30: 15 mL via OROMUCOSAL

## 2015-10-30 MED ORDER — MIDAZOLAM HCL 2 MG/2ML IJ SOLN
1.0000 mg | INTRAMUSCULAR | Status: DC | PRN
  Administered 2015-10-30 (×3): 2 mg via INTRAVENOUS
  Filled 2015-10-30 (×2): qty 2

## 2015-10-30 MED ORDER — LISINOPRIL 10 MG PO TABS
10.0000 mg | ORAL_TABLET | Freq: Every day | ORAL | Status: DC
  Administered 2015-10-31 – 2015-11-03 (×4): 10 mg via ORAL
  Filled 2015-10-30 (×4): qty 1

## 2015-10-30 MED ORDER — CLONAZEPAM 1 MG PO TABS
1.0000 mg | ORAL_TABLET | Freq: Two times a day (BID) | ORAL | Status: DC | PRN
  Administered 2015-11-01 – 2015-11-03 (×6): 1 mg via ORAL
  Filled 2015-10-30 (×6): qty 1

## 2015-10-30 MED ORDER — GABAPENTIN 400 MG PO CAPS
400.0000 mg | ORAL_CAPSULE | Freq: Three times a day (TID) | ORAL | Status: DC
  Administered 2015-10-31 – 2015-11-03 (×10): 400 mg via ORAL
  Filled 2015-10-30 (×10): qty 1

## 2015-10-30 MED ORDER — MIDAZOLAM HCL 2 MG/2ML IJ SOLN
INTRAMUSCULAR | Status: AC
  Filled 2015-10-30: qty 2

## 2015-10-30 MED ORDER — TRAMADOL HCL 50 MG PO TABS
50.0000 mg | ORAL_TABLET | Freq: Four times a day (QID) | ORAL | Status: DC | PRN
  Administered 2015-11-01: 50 mg via ORAL
  Filled 2015-10-30: qty 1

## 2015-10-30 NOTE — Progress Notes (Signed)
RT extubated pt per MD order. Rt placed pt on 2lpm [place].

## 2015-10-30 NOTE — Progress Notes (Signed)
PULMONARY / CRITICAL CARE MEDICINE   Name: Brittany [name] MRN: [id number] DOB: Feb 14, 1966    ADMISSION DATE:  10/28/2015 CONSULTATION DATE:  10/30/2015  REFERRING MD:  ED, Pfeiffer  CHIEF COMPLAINT:  Unresponsive, overdose  HISTORY OF PRESENT ILLNESS:   50 year old woman with anxiety and bipolar disorder with a history of depression and recurrent overdoses in the past was brought in by EMS after being found unresponsive. On EMS arrival she was seated in a chair with snoring respirations and unresponsive. Per her significant other, she took a handful of pills mostly baclofen possibly Lamictal and Klonopin.  She was intubated shortly after arrival to the emergency room with minimal medication. [facility] Review of her chart shows recurrent episodes of overdose last in 07/2015 where she was discharged home after psychiatric evaluation. Prior to this in 05/2015 she was admitted for inpatient behavioral health after a similar overdose. I also note admits for overdose in 03/2015 and 04/2015  SUBj - afebrile Intubated Denies pain Weaning well on PS 5/5  VITAL SIGNS: BP (!) 147/84   Pulse 64   Temp 97.9 F (36.6 C)   Resp 15   Ht 5\' 5"  (1.651 m)   Wt 224 lb 13.9 oz (102 kg)   SpO2 97%   BMI 37.42 kg/m   HEMODYNAMICS:    VENTILATOR SETTINGS: Vent Mode: PSV;CPAP FiO2 (%):  [30 %] 30 % Set Rate:  [15 bmp] 15 bmp Vt Set:  [460 mL] 460 mL PEEP:  [5 cmH20] 5 cmH20 Pressure Support:  [5 cmH20] 5 cmH20 Plateau Pressure:  [14 cmH20-18 cmH20] 18 cmH20  INTAKE / OUTPUT: I/O last 3 completed shifts: In: 3107.1 [I.V.:2907.1; IV Piggyback:200] Out: 3630 [Urine:1480; Emesis/NG output:2150]  PHYSICAL EXAMINATION: Gen. No distress. Neuro:  Awake, non focal, no tremors, pupils 4 mm not reactive to light HENT - Oral ETT, moist mucus membranes, No thyromegaly, JVD Cardiovascular: Rhythm regular, heart sounds  normal, No MRG Lungs: no use of accessory muscles, no dullness to  percussion, decreased without rales or rhonchi  Abdomen: soft and non-tender, no hepatosplenomegaly, BS normal. Musculoskeletal: No deformities, no cyanosis or clubbing, no edema Skin: Intact  LABS:  BMET  Recent Labs Lab 10/28/15 2110 10/28/15 2124 10/29/15 0319  NA 139 140 139  K 3.5 3.5 3.9  CL 105 102 106  CO2 25  --  25  BUN 9 8 9   CREATININE 0.71 0.90 0.69  GLUCOSE 97 106* 98    Electrolytes  Recent Labs Lab 10/28/15 2110 10/29/15 0319  CALCIUM 8.8* 8.4*  MG  --  1.9  PHOS  --  3.9    CBC  Recent Labs Lab 10/28/15 2110 10/28/15 2124 10/29/15 0319 10/30/15 0338  WBC 9.7  --  7.5 6.8  HGB 13.6 14.3 12.5 12.4  HCT 39.7 42.0 38.3 38.9  PLT 283  --  282 234    Coag's  Recent Labs Lab 10/28/15 2110  INR 0.90    Sepsis Markers  Recent Labs Lab 10/28/15 2123 10/28/15 2126 10/29/15 0319  LATICACIDVEN 4.70* 1.54  --   PROCALCITON  --   --  <0.10    ABG  Recent Labs Lab 10/28/15 2158 10/30/15 0305  PHART 7.355 7.347*  PCO2ART 42.9 54.0*  PO2ART 230* 82.1*    Liver Enzymes  Recent Labs Lab 10/28/15 2110  AST 21  ALT 24  ALKPHOS 74  BILITOT 0.4  ALBUMIN 4.5    Cardiac Enzymes  Recent Labs Lab 10/28/15 2110  TROPONINI <0.03    Glucose  Recent Labs Lab 10/28/15 2126 10/29/15 0347  GLUCAP 99 113*    Imaging Ct Head Wo Contrast  Result Date: 10/29/2015 CLINICAL DATA:  Acute encephalopathy. EXAM: CT HEAD WITHOUT CONTRAST TECHNIQUE: Contiguous axial images were obtained from the base of the skull through the vertex without intravenous contrast. COMPARISON:  08/02/2015 FINDINGS: Brain: No acute intracranial abnormality. Specifically, no hemorrhage, hydrocephalus, mass lesion, acute infarction, or significant intracranial injury. Vascular: No hyperdense vessel or unexpected calcification. Skull: No acute calvarial abnormality. Sinuses/Orbits: Visualized paranasal sinuses and mastoids clear. Orbital soft tissues  unremarkable. Other: None IMPRESSION: Negative. Electronically Signed   By: Charlett Nose M.D.   On: 10/29/2015 17:18   Dg Chest Port 1 View  Result Date: 10/30/2015 CLINICAL DATA:  Acute respiratory failure. Subsequent encounter. History of bronchitis. EXAM: PORTABLE CHEST 1 VIEW COMPARISON:  10/28/2015 and older studies. FINDINGS: Cardiac silhouette is borderline enlarged. Normal mediastinal and hilar contours. Mild streaky medial lung base opacity consistent with atelectasis. Lungs otherwise clear. No pleural effusion or pneumothorax. Endotracheal tube tip projects 5.5 cm above the carina, without change. Nasal/orogastric tube passes below the diaphragm well into stomach. IMPRESSION: 1. Stable support apparatus. 2. Medial lung base opacities most consistent with atelectasis. Left suprahilar opacity noted on the prior study has improved. Electronically Signed   By: Amie Portland M.D.   On: 10/30/2015 07:24     STUDIES:  Head CT 9/15 >> neg  CULTURES:   ANTIBIOTICS:   SIGNIFICANT EVENTS:   LINES/TUBES: ETT 9/14 >>  DISCUSSION: Recurrent overdose -this time appears to be baclofen possibly with Lamictal and Klonopin, UDS positive for cocaine.    ASSESSMENT / PLAN:  PULMONARY A: Acute respiratory failure-intubated for airway protection P:   Tolerates SBTs- extubate  CARDIOVASCULAR A:  Hypertension Lactic acidosis-resolved P:  Hold lisinopril, avoid BB Use hydralazine when necessary  RENAL A:   Hypokalemia At risk AK I P:   Replete lytes as indicated  GASTROINTESTINAL A:   No issues P:   Resume PO once extubated  HEMATOLOGIC A:   No issues P:  Subcutaneous Lovenox for DVT prophylaxis  INFECTIOUS A:   No evidence of aspiration P:    Observe off antibiotics. Low Pct reassuring  ENDOCRINE A:   No issues   P:   Follow CBG  NEUROLOGIC A:   Acute encephalopathy, related to overdose Substance abuse Depression-suicidal intent P:   RASS goal:  0 PAD protocol with intermittent Fent and versed as needed Psychiatric evaluation and inpatient behavioral health after extubation resume home medications slowly including carbamazepine once taking PO  FAMILY  - Updates: None at bedside - Inter-disciplinary family meet or Palliative Care meeting due by: NA  Critical care time- 32 mins  Cyril Mourning MD. [facility] [place] [facility] Pager [phone] If no response call [phone]   10/30/2015, 9:57 AM

## 2015-10-30 NOTE — Procedures (Signed)
Extubation Procedure Note  Patient Details:   Name: [name] DOB: 10/25/1965 MRN: [id number]   Airway Documentation:     Evaluation  O2 sats: stable throughout Complications: No apparent complications Patient did tolerate procedure well. Bilateral Breath Sounds: Clear, Diminished   No  Suzan GaribaldiCraddock, Nyla Creason Ann 10/30/2015, 10:59 AM

## 2015-10-30 NOTE — Progress Notes (Signed)
eLink Physician-Brief Progress Note Patient Name: [name] DOB: 07-15-1965 MRN: [id number]   Date of Service  10/30/2015  HPI/Events of Note  Bedside nurse reporting patient now becoming more awake. Dyssynchronous with ventilator.   eICU Interventions  Versed IV when necessary for sedation      Intervention Category Major Interventions: Delirium, psychosis, severe agitation - evaluation and management  Lawanda CousinsJennings Jameire Kouba 10/30/2015, 12:10 AM

## 2015-10-31 MED ORDER — ORAL CARE MOUTH RINSE
15.0000 mL | Freq: Two times a day (BID) | OROMUCOSAL | Status: DC
  Administered 2015-10-31 – 2015-11-03 (×7): 15 mL via OROMUCOSAL

## 2015-10-31 NOTE — Progress Notes (Signed)
Patients left eye appears slightly drooping, more so when the patient turns her head to the left. Patients smile symmetrical, tongue protruding midline, grip equal bilaterally, and no drifting when arms held out from body.  Will continue to monitor.

## 2015-10-31 NOTE — Progress Notes (Signed)
Pt appears alert, sitting in bed with eyes open. She is answering yes/no questions with head nod "yes" to all questions asked. When asking questions requiring elaboration or a verbal answer, pt not responding verbally. Pt is able to move upper extremities to command, but does not move her lower extremities to command. Will continue to monitor.

## 2015-10-31 NOTE — Progress Notes (Signed)
Assessment of patients swallowing abilities attempted with 1 ounce of water. The patient was asked if she felt she could swallow, to which she nodded, "yes". Patient took a small amount of water into her mouth, but allowed it to run out of the sides of her mouth. When pt was asked to swallow, she held the remaining water in her mouth without swallowing. The water was suctioned out of her mouth with a yonker. A second small amount of water was given to the patient, and again, the water ran out of the sides of her mouth and the remaining water she held in her mouth without swallowing. Multiple attempts were made to have the patient swallow, however, she did not/was unable to comply. The water was suctioned out of her mouth with a yonker. Will attempt to test patients ability to swallow and understand/follow commands throughout the shift. None of the patients AM medications were able to be given due to her inability to safely take PO meds at this time.

## 2015-10-31 NOTE — Consult Note (Signed)
I am unable to do a psychiatric consult on this patient today, she is still lethargic and unable to give information. Plan: Patient will be seen when she is more alert and oriented.  Thedore MinsMojeed Lyndle Pang, MD

## 2015-10-31 NOTE — Progress Notes (Signed)
PULMONARY / CRITICAL CARE MEDICINE   Name: Brittany [name] MRN: [id number] DOB: [birth year]    ADMISSION DATE:  10/28/2015 CONSULTATION DATE:  10/31/2015  REFERRING MD:  ED, Pfeiffer  CHIEF COMPLAINT:  Unresponsive, overdose  HISTORY OF PRESENT ILLNESS:   50 year old woman with anxiety and bipolar disorder with a history of depression and recurrent overdoses in the past was brought in by EMS after being found unresponsive. On EMS arrival she was seated in a chair with snoring respirations and unresponsive. Per her significant other, she took a handful of pills mostly baclofen possibly Lamictal and Klonopin.  She was intubated shortly after arrival to the emergency room with minimal medication. [facility] M asked to admit Review of her chart shows recurrent episodes of overdose last in 07/2015 where she was discharged home after psychiatric evaluation. Prior to this in 05/2015 she was admitted for inpatient behavioral health after a similar overdose. I also note admits for overdose in 03/2015 and 04/2015  SUBj - afebrile Denies pain More verbal  VITAL SIGNS: BP (!) 169/98   Pulse 90   Temp 99.3 F (37.4 C)   Resp 15   Ht 5\' 5"  (1.651 m)   Wt 101.4 kg (223 lb 8.7 oz)   SpO2 96%   BMI 37.20 kg/m   HEMODYNAMICS:    VENTILATOR SETTINGS:    INTAKE / OUTPUT: I/O last 3 completed shifts: In: 2800 [I.V.:2800] Out: 3806 [Urine:1931; Emesis/NG output:1875]  PHYSICAL EXAMINATION: Gen. No distress. Neuro:  Awake,oriented to self & place, non focal, no tremors, pupils 4 mm RTL HENT - moist mucus membranes, No thyromegaly, JVD Cardiovascular: Rhythm regular, heart sounds  normal, No MRG Lungs: no use of accessory muscles, no dullness to percussion, decreased without rales or rhonchi  Abdomen: soft and non-tender, no hepatosplenomegaly, BS normal. Musculoskeletal: No deformities, no cyanosis or clubbing, no edema Skin: Intact  LABS:  BMET  Recent Labs Lab 10/28/15 2110 10/28/15 2124  10/29/15 0319  NA 139 140 139  K 3.5 3.5 3.9  CL 105 102 106  CO2 25  --  25  BUN 9 8 9   CREATININE 0.71 0.90 0.69  GLUCOSE 97 106* 98    Electrolytes  Recent Labs Lab 10/28/15 2110 10/29/15 0319  CALCIUM 8.8* 8.4*  MG  --  1.9  PHOS  --  3.9    CBC  Recent Labs Lab 10/28/15 2110 10/28/15 2124 10/29/15 0319 10/30/15 0338  WBC 9.7  --  7.5 6.8  HGB 13.6 14.3 12.5 12.4  HCT 39.7 42.0 38.3 38.9  PLT 283  --  282 234    Coag's  Recent Labs Lab 10/28/15 2110  INR 0.90    Sepsis Markers  Recent Labs Lab 10/28/15 2123 10/28/15 2126 10/29/15 0319 10/30/15 0338  LATICACIDVEN 4.70* 1.54  --   --   PROCALCITON  --   --  <0.10 <0.10    ABG  Recent Labs Lab 10/28/15 2158 10/30/15 0305  PHART 7.355 7.347*  PCO2ART 42.9 54.0*  PO2ART 230* 82.1*    Liver Enzymes  Recent Labs Lab 10/28/15 2110  AST 21  ALT 24  ALKPHOS 74  BILITOT 0.4  ALBUMIN 4.5    Cardiac Enzymes  Recent Labs Lab 10/28/15 2110  TROPONINI <0.03    Glucose  Recent Labs Lab 10/28/15 2126 10/29/15 0347  GLUCAP 99 113*    Imaging No results found.   STUDIES:  Head CT 9/15 >> neg  CULTURES:   ANTIBIOTICS:   SIGNIFICANT EVENTS:  LINES/TUBES: ETT 9/14 >>  DISCUSSION: Recurrent overdose -this time appears to be baclofen possibly with Lamictal and Klonopin, UDS positive for cocaine.  Appeared to be somewhat catatonic yesterday but slowly improving today, not swallowing yet   ASSESSMENT / PLAN:  PULMONARY A: Acute respiratory failure-intubated for airway protection P:   stable  CARDIOVASCULAR A:  Hypertension Lactic acidosis-resolved P:  Resume lisinopril, avoid BB Use hydralazine when necessary  RENAL A:   Hypokalemia  P:   Replete lytes as indicated  GASTROINTESTINAL A:   No issues P:   Resume PO once extubated  HEMATOLOGIC A:   No issues P:  Subcutaneous Lovenox for DVT prophylaxis  INFECTIOUS A:   No evidence of  aspiration P:    Observe off antibiotics. Low Pct reassuring   NEUROLOGIC A:   Acute encephalopathy, related to overdose Substance abuse Depression-suicidal intent P:   Psychiatric evaluation  Requested, needs inpatient behavioral health  resume home medications slowly including carbamazepine once taking PO  FAMILY  - Updates: None at bedside - Inter-disciplinary family meet or Palliative Care meeting due by: NA    Cyril Mourningakesh Tahlia Deamer MD. FCCP. [facility] Pager [phone] If no response call [phone]   10/31/2015, 10:29 AM

## 2015-10-31 NOTE — Progress Notes (Signed)
Upon reassessment patient's left eye is still drooping.  Patient can raise both arms with equal grip; tongue protrudes midline, and smile is symmetrical.  Will continue to monitor.
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
## 2015-11-01 ENCOUNTER — Other Ambulatory Visit: Payer: Self-pay

## 2015-11-01 DIAGNOSIS — T428X2D Poisoning by antiparkinsonism drugs and other central muscle-tone depressants, intentional self-harm, subsequent encounter: Secondary | ICD-10-CM

## 2015-11-01 MED ORDER — TRAMADOL HCL 50 MG PO TABS
100.0000 mg | ORAL_TABLET | Freq: Four times a day (QID) | ORAL | Status: DC | PRN
  Administered 2015-11-01 – 2015-11-03 (×3): 100 mg via ORAL
  Filled 2015-11-01 (×3): qty 2

## 2015-11-01 MED ORDER — ENOXAPARIN SODIUM 60 MG/0.6ML ~~LOC~~ SOLN
50.0000 mg | Freq: Every day | SUBCUTANEOUS | Status: DC
  Administered 2015-11-01 – 2015-11-02 (×2): 50 mg via SUBCUTANEOUS
  Filled 2015-11-01 (×2): qty 0.6

## 2015-11-01 MED ORDER — PROMETHAZINE HCL 25 MG PO TABS
12.5000 mg | ORAL_TABLET | Freq: Once | ORAL | Status: AC
  Administered 2015-11-01: 12.5 mg via ORAL
  Filled 2015-11-01: qty 1
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
## 2015-11-01 NOTE — Progress Notes (Signed)
Date:  November 01, 2015 Chart reviewed for concurrent status and case management needs. Will continue to follow the patient for status change:  Overdose/extubated on [id number]/remains on 02 and being readjusted/awake with flat affect Discharge Planning: following for needs Expected discharge date: [id number] Marcelle SmilingRhonda Davis, BSN, DavisRN3, [place]M   [phone]

## 2015-11-01 NOTE — Progress Notes (Signed)
Nutrition Follow-up  DOCUMENTATION CODES:   Obesity unspecified  INTERVENTION:  - Continue to encourage PO intakes of meals. - RD will continue to monitor for nutrition-related needs.  NUTRITION DIAGNOSIS:   Inadequate oral intake related to acute illness, poor appetite as evidenced by meal completion < 50%. -revised.  GOAL:   Patient will meet greater than or equal to 90% of their needs -unmet.  MONITOR:   PO intake, Weight trends, Labs, I & O's  ASSESSMENT:   50 year old woman with anxiety and bipolar disorder with a history of depression and recurrent overdoses in the past was brought in by EMS after being found unresponsive. On EMS arrival she was seated in a chair with snoring respirations and unresponsive. Per her significant other, she took a handful of pills mostly baclofen possibly Lamictal and Klonopin. She was intubated shortly after arrival to the emergency room with minimal medication. [facility]  [facility]18 Pt extubated 9/16 AM and estimated nutrition needs updated during this visit related to this event.  Per chart review, weight up 1.5 kg since admission. Diet advanced yesterday PM from NPO to FLD with 25% completion of dinner. Visualized breakfast tray with 25-50% completion and pt interested in having something more than liquids for lunch today. RN, who was at bedside, reported that diet was recently advanced to Soft. Briefly reviewed this with pt and sitter reports she will further review with pt and find items that pt would like to have for lunch meal.  Pt denies chewing or swallowing difficulty although she does state some pain associated with swallowing. Will monitor for intakes on Soft diet and order oral nutrition supplements and/or snacks at follow-up if pt is unable to meet needs at meal times.   Medications reviewed; 100 mcg oral Synthroid/day, 40 mg Protonix/day.  Labs reviewed; CBG: 113 mg/dL this AM, Ca: 8.4 mg/dL. IVF: D5-NS @ 75 mL/hr (306 kcal).      9/15 - No family/visitors present at this time.  - OGT in place and noted canister on the wall was completely full; RN reports that this drainage was from overnight and that OGT is currently clamped and medications were given recently.  - Physical assessment shows no muscle or fat wasting, no edema.  - Per chart review, pt has lost 10 lbs (4.3% body weight) in the past 3 months which is not significant for time frame.   Patient is currently intubated on ventilator support MV: 7.2 L/min Temp (24hrs), Avg:96 F (35.6 C), Min:94.1 F (34.5 C), Max:98.6 F (37 C) Propofol: none  IVF: D5-NS @ 75 mL/hr (306 kcal).    Diet Order:  DIET SOFT Room service appropriate? Yes; Fluid consistency: Thin  Skin:  Reviewed, no issues  Last BM:  PTA  Height:   Ht Readings from Last 1 Encounters:  10/29/15 5\' 5"  (1.651 m)    Weight:   Wt Readings from Last 1 Encounters:  11/01/15 225 lb 5 oz (102.2 kg)    Ideal Body Weight:  56.82 kg  BMI:  Body mass index is 37.49 kg/m.  Estimated Nutritional Needs:   Kcal:  1550-1750  Protein:  70-80 grams  Fluid:  >/= 1.7 L/day  EDUCATION NEEDS:   No education needs identified at this time    Trenton GammonJessica Hiliary Osorto, MS, RD, LDN Inpatient Clinical Dietitian Pager # [phone] After hours/weekend pager # [phone]

## 2015-11-01 NOTE — Progress Notes (Signed)
PROGRESS NOTE    Brittany [name]  WUJ:[id number] DOB: 03-09-1965 DOA: 10/28/2015 PCP: Brittany [name]   Brief Narrative:   50 year old woman with anxiety and bipolar disorder with a history of depression and recurrent overdoses in the past was brought in by EMS after being found unresponsive. On EMS arrival she was seated in a chair with snoring respirations and unresponsive. Per her significant other, she took a handful of pills mostly baclofen possibly Lamictal and Klonopin.  She was intubated shortly after arrival to the emergency room with minimal medication. [facility] Review of her chart shows recurrent episodes of overdose last in 07/2015 where she was discharged home after psychiatric evaluation. Prior to this in 05/2015 she was admitted for inpatient behavioral health after a similar overdose.  [facility]   Assessment & Plan:   Active Problems:   Baclofen overdose  Acute respiratory failure with hypoxia , intubated for airway protection from possible baclofen overdose.  Extubated and on RA.    Hypertension:  Well controlled.  Resume prn hydralazine.   Hypokalemia: repleted as needed.   Depression, suicidal intent with substance overdose: Psychiatry consulted and awaiting recommendations.  Resume home meds.    Trigeminal neuralgia: resumed home meds and increased tramadol.       DVT prophylaxis: (Lovenox/ Code Status: (Full/ Family Communication: none at bedside.  Disposition Plan: pending further evaluation by psychiatry.   Consultants:   Psychaitry.    Procedures: none   Antimicrobials: none.   Subjective: Reports her neuralgia Is worse this am.  Objective: Vitals:   11/01/15 0800 11/01/15 1000 11/01/15 1100 11/01/15 1323  BP: 130/73 129/80  127/75  Pulse: 89 83 77 83  Resp: (!) 21 14 16 15   Temp: 98.6 F (37 C) 98.4 F (36.9 C) 98.6 F (37 C) 98.6 F (37 C)  TempSrc:        SpO2: 95% 97% 96% 98%  Weight:      Height:        Intake/Output Summary (Last 24 hours) at 11/01/15 1428 Last data filed at 11/01/15 1334  Gross per 24 hour  Intake          1503.75 ml  Output             3750 ml  Net         -2246.25 ml   Filed Weights   10/30/15 0434 10/31/15 0550 11/01/15 0342  Weight: 102 kg (224 lb 13.9 oz) 101.4 kg (223 lb 8.7 oz) 102.2 kg (225 lb 5 oz)    Examination:  General exam: Appears anxious and in mild distress from facial pain. Respiratory system: Clear to auscultation. Respiratory effort normal. Cardiovascular system: S1 & S2 heard, RRR. No JVD, murmurs, rubs, gallops or clicks. No pedal edema. Gastrointestinal system: Abdomen is nondistended, soft and nontender. No organomegaly or masses felt. Normal bowel sounds heard. Central nervous system: Alert and oriented. No focal neurological deficits. Extremities: Symmetric 5 x 5 power. Skin: No rashes, lesions or ulcers     Data Reviewed: I have personally reviewed following labs and imaging studies  CBC:  Recent Labs Lab 10/28/15 2110 10/28/15 2124 10/29/15 0319 10/30/15 0338  WBC 9.7  --  7.5 6.8  NEUTROABS 5.7  --   --   --   HGB 13.6 14.3 12.5 12.4  HCT 39.7 42.0 38.3 38.9  MCV 89.8  --  91.8 95.6  PLT 283  --  282  234   Basic Metabolic Panel:  Recent Labs Lab 10/28/15 2110 10/28/15 2124 10/29/15 0319  NA 139 140 139  K 3.5 3.5 3.9  CL 105 102 106  CO2 25  --  25  GLUCOSE 97 106* 98  BUN 9 8 9   CREATININE 0.71 0.90 0.69  CALCIUM 8.8*  --  8.4*  MG  --   --  1.9  PHOS  --   --  3.9   GFR: Estimated Creatinine Clearance: 102 mL/min (by C-G formula based on SCr of 0.69 mg/dL). Liver Function Tests:  Recent Labs Lab 10/28/15 2110  AST 21  ALT 24  ALKPHOS 74  BILITOT 0.4  PROT 8.6*  ALBUMIN 4.5   No results for input(s): LIPASE, AMYLASE in the last 168 hours. No results for input(s): AMMONIA in the last 168 hours. Coagulation Profile:  Recent Labs Lab  10/28/15 2110  INR 0.90   Cardiac Enzymes:  Recent Labs Lab 10/28/15 2110  TROPONINI <0.03   BNP (last 3 results) No results for input(s): PROBNP in the last 8760 hours. HbA1C: No results for input(s): HGBA1C in the last 72 hours. CBG:  Recent Labs Lab 10/28/15 2126 10/29/15 0347  GLUCAP 99 113*   Lipid Profile: No results for input(s): CHOL, HDL, LDLCALC, TRIG, CHOLHDL, LDLDIRECT in the last 72 hours. Thyroid Function Tests: No results for input(s): TSH, T4TOTAL, FREET4, T3FREE, THYROIDAB in the last 72 hours. Anemia Panel: No results for input(s): VITAMINB12, FOLATE, FERRITIN, TIBC, IRON, RETICCTPCT in the last 72 hours. Sepsis Labs:  Recent Labs Lab 10/28/15 2123 10/28/15 2126 10/29/15 0319 10/30/15 0338  PROCALCITON  --   --  <0.10 <0.10  LATICACIDVEN 4.70* 1.54  --   --     Recent Results (from the past 240 hour(s))  MRSA PCR Screening     Status: None   Collection Time: 10/29/15 12:39 AM  Result Value Ref Range Status   MRSA by PCR NEGATIVE NEGATIVE Final    Comment:        The GeneXpert MRSA Assay (FDA approved for NASAL specimens only), is one component of a comprehensive MRSA colonization surveillance program. It is not intended to diagnose MRSA infection nor to guide or monitor treatment for MRSA infections.          Radiology Studies: No results found.      Scheduled Meds: . carbamazepine  300 mg Oral BID  . carisoprodol  350 mg Oral BID  . DULoxetine  60 mg Oral Daily  . enoxaparin (LOVENOX) injection  40 mg Subcutaneous QHS  . gabapentin  400 mg Oral TID  . lamoTRIgine  100 mg Oral BID  . levothyroxine  100 mcg Oral QAC breakfast  . lisinopril  10 mg Oral Daily  . mouth rinse  15 mL Mouth Rinse BID  . pantoprazole sodium  40 mg Per Tube Daily  . promethazine  12.5 mg Oral Once   Continuous Infusions: . dextrose 5 % and 0.9% NaCl 75 mL/hr at 11/01/15 0600     LOS: 4 days    Time spent: 25  minutes    Brittany Wittner, [name] Pager [phone] If 7PM-7AM, please contact night-coverage [email or web address] Password [email or web address] 11/01/2015, 2:28 PM

## 2015-11-02 DIAGNOSIS — F3160 Bipolar disorder, current episode mixed, unspecified: Secondary | ICD-10-CM

## 2015-11-02 DIAGNOSIS — Z79899 Other long term (current) drug therapy: Secondary | ICD-10-CM

## 2015-11-02 DIAGNOSIS — T1491 Suicide attempt: Secondary | ICD-10-CM

## 2015-11-02 DIAGNOSIS — Z87891 Personal history of nicotine dependence: Secondary | ICD-10-CM

## 2015-11-02 MED ORDER — PANTOPRAZOLE SODIUM 40 MG PO TBEC
40.0000 mg | DELAYED_RELEASE_TABLET | Freq: Every day | ORAL | Status: DC
  Administered 2015-11-03: 40 mg via ORAL
  Filled 2015-11-02: qty 1

## 2015-11-02 MED ORDER — OXYCODONE-ACETAMINOPHEN 5-325 MG PO TABS
1.0000 | ORAL_TABLET | Freq: Once | ORAL | Status: AC
  Administered 2015-11-02: 1 via ORAL
  Filled 2015-11-02: qty 1
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
## 2015-11-02 NOTE — Progress Notes (Signed)
Patient's facial pain is relieved by half with dose of oxycodone. See MAR and Pain Flow sheet. Patient is requesting this medication prn.
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
## 2015-11-02 NOTE — Progress Notes (Signed)
PROGRESS NOTE    Brittany [name]  EAV:[id number] DOB: [birth year] DOA: 10/28/2015 PCP: [name], [name], MD   Brief Narrative:   50 year old woman with anxiety and bipolar disorder with a history of depression and recurrent overdoses in the past was brought in by EMS after being found unresponsive. On EMS arrival she was seated in a chair with snoring respirations and unresponsive. Per her significant other, she took a handful of pills mostly baclofen possibly Lamictal and Klonopin.  She was intubated shortly after arrival to the emergency room with minimal medication[facility] Review of her chart shows recurrent episodes of overdose last in 07/2015 where she was discharged home after psychiatric evaluation. Prior to this in 05/2015 she was admitted for inpatient behavioral health after a similar overdose.  [facility]   Assessment & Plan:   Principal Problem:   Baclofen overdose  Acute respiratory failure with hypoxia , intubated for airway protection from possible baclofen overdose.  Extubated and on RA.    Hypertension:  Well controlled.  Resume prn hydralazine.   Hypokalemia: repleted as needed.   Depression, suicidal intent with substance overdose: Psychiatry consulted and awaiting recommendations.  Resume home meds.    Trigeminal neuralgia: resumed home meds and increased tramadol.  Added one dose of percocet.       DVT prophylaxis: (Lovenox/ Code Status: (Full/ Family Communication: none at bedside.  Disposition Plan: pending further evaluation by psychiatry. Transfer to med surg.    Consultants:   Psychaitry.    Procedures: none   Antimicrobials: none.   Subjective: Facial pain improved.   Objective: Vitals:   11/02/15 0800 11/02/15 1100 11/02/15 1120 11/02/15 1200  BP: 116/67  120/66 122/74  Pulse: 87 89    Resp: 14 14 12 13   Temp: 99.3 F (37.4 C) 99.3 F (37.4 C) 99.3 F (37.4 C) 99  F (37.2 C)  TempSrc:      SpO2: 97% 100%    Weight:      Height:        Intake/Output Summary (Last 24 hours) at 11/02/15 1356 Last data filed at 11/02/15 1337  Gross per 24 hour  Intake             2460 ml  Output             1125 ml  Net             1335 ml   Filed Weights   10/30/15 0434 10/31/15 0550 11/01/15 0342  Weight: 102 kg (224 lb 13.9 oz) 101.4 kg (223 lb 8.7 oz) 102.2 kg (225 lb 5 oz)    Examination:  General exam: comfortable.  Respiratory system: Clear to auscultation. Respiratory effort normal. Cardiovascular system: S1 & S2 heard, RRR. No JVD, murmurs, rubs, gallops or clicks. No pedal edema. Gastrointestinal system: Abdomen is nondistended, soft and nontender. No organomegaly or masses felt. Normal bowel sounds heard. Central nervous system: Alert and oriented. No focal neurological deficits. Extremities: Symmetric 5 x 5 power. Skin: No rashes, lesions or ulcers     Data Reviewed: I have personally reviewed following labs and imaging studies  CBC:  Recent Labs Lab 10/28/15 2110 10/28/15 2124 10/29/15 0319 10/30/15 0338  WBC 9.7  --  7.5 6.8  NEUTROABS 5.7  --   --   --   HGB 13.6 14.3 12.5 12.4  HCT 39.7 42.0 38.3 38.9  MCV 89.8  --  91.8 95.6  PLT 283  --  282 234   Basic Metabolic Panel:  Recent Labs Lab 10/28/15 2110 10/28/15 2124 10/29/15 0319  NA 139 140 139  K 3.5 3.5 3.9  CL 105 102 106  CO2 25  --  25  GLUCOSE 97 106* 98  BUN 9 8 9   CREATININE 0.71 0.90 0.69  CALCIUM 8.8*  --  8.4*  MG  --   --  1.9  PHOS  --   --  3.9   GFR: Estimated Creatinine Clearance: 102 mL/min (by C-G formula based on SCr of 0.69 mg/dL). Liver Function Tests:  Recent Labs Lab 10/28/15 2110  AST 21  ALT 24  ALKPHOS 74  BILITOT 0.4  PROT 8.6*  ALBUMIN 4.5   No results for input(s): LIPASE, AMYLASE in the last 168 hours. No results for input(s): AMMONIA in the last 168 hours. Coagulation Profile:  Recent Labs Lab 10/28/15 2110    INR 0.90   Cardiac Enzymes:  Recent Labs Lab 10/28/15 2110  TROPONINI <0.03   BNP (last 3 results) No results for input(s): PROBNP in the last 8760 hours. HbA1C: No results for input(s): HGBA1C in the last 72 hours. CBG:  Recent Labs Lab 10/28/15 2126 10/29/15 0347  GLUCAP 99 113*   Lipid Profile: No results for input(s): CHOL, HDL, LDLCALC, TRIG, CHOLHDL, LDLDIRECT in the last 72 hours. Thyroid Function Tests: No results for input(s): TSH, T4TOTAL, FREET4, T3FREE, THYROIDAB in the last 72 hours. Anemia Panel: No results for input(s): VITAMINB12, FOLATE, FERRITIN, TIBC, IRON, RETICCTPCT in the last 72 hours. Sepsis Labs:  Recent Labs Lab 10/28/15 2123 10/28/15 2126 10/29/15 0319 10/30/15 0338  PROCALCITON  --   --  <0.10 <0.10  LATICACIDVEN 4.70* 1.54  --   --     Recent Results (from the past 240 hour(s))  MRSA PCR Screening     Status: None   Collection Time: 10/29/15 12:39 AM  Result Value Ref Range Status   MRSA by PCR NEGATIVE NEGATIVE Final    Comment:        The GeneXpert MRSA Assay (FDA approved for NASAL specimens only), is one component of a comprehensive MRSA colonization surveillance program. It is not intended to diagnose MRSA infection nor to guide or monitor treatment for MRSA infections.          Radiology Studies: No results found.      Scheduled Meds: . carbamazepine  300 mg Oral BID  . carisoprodol  350 mg Oral BID  . DULoxetine  60 mg Oral Daily  . enoxaparin (LOVENOX) injection  50 mg Subcutaneous QHS  . gabapentin  400 mg Oral TID  . lamoTRIgine  100 mg Oral BID  . levothyroxine  100 mcg Oral QAC breakfast  . lisinopril  10 mg Oral Daily  . mouth rinse  15 mL Mouth Rinse BID  . oxyCODONE-acetaminophen  1 tablet Oral Once  . [START ON 11/03/2015] pantoprazole  40 mg Oral Daily   Continuous Infusions: . dextrose 5 % and 0.9% NaCl 75 mL/hr at 11/02/15 1004     LOS: 5 days    Time spent: 25  minutes    Robyne Matar, MD [facility] Pager [phone] If 7PM-7AM, please contact night-coverage [email or web address] Password [email or web address] 11/02/2015, 1:56 PM

## 2015-11-03 DIAGNOSIS — T428X1D Poisoning by antiparkinsonism drugs and other central muscle-tone depressants, accidental (unintentional), subsequent encounter: Secondary | ICD-10-CM

## 2015-11-03 MED ORDER — DULOXETINE HCL 60 MG PO CPEP: 60.0000 mg | ORAL_CAPSULE | Freq: Two times a day (BID) | ORAL | 0 refills | Status: DC

## 2015-11-03 MED ORDER — BUTALBITAL-APAP-CAFFEINE 50-325-40 MG PO TABS: 1.0000 | ORAL_TABLET | Freq: Two times a day (BID) | ORAL | 0 refills | Status: DC | PRN

## 2015-11-03 MED ORDER — CARBAMAZEPINE ER 300 MG PO CP12: 300.0000 mg | ORAL_CAPSULE | Freq: Every day | ORAL | 0 refills | Status: DC

## 2015-11-03 MED ORDER — LEVOTHYROXINE SODIUM 100 MCG PO TABS: 100.0000 ug | ORAL_TABLET | Freq: Every day | ORAL | 0 refills | Status: DC

## 2015-11-03 MED ORDER — GABAPENTIN 400 MG PO CAPS: 400.0000 mg | ORAL_CAPSULE | Freq: Three times a day (TID) | ORAL | 0 refills | Status: DC

## 2015-11-03 NOTE — Progress Notes (Signed)
Patient belongings retrieved from security and brought back to patient.

## 2015-11-03 NOTE — Progress Notes (Signed)
Received a referral for OP physical therapy, provided patient with script and list of facilities to chose from for therapy. They will call to make appt.

## 2015-11-03 NOTE — Progress Notes (Addendum)
Patient refused to take her Carbamazepine last night.  She reports that she feels like her face is burning after taking this medication.  RN reminded patient that she had been taking this already for several doses since being in the hospital.  She refused despite medication education.  Will monitor patient.Kenton KingfisherMills, Kylin Genna [place]

## 2015-11-03 NOTE — Progress Notes (Signed)
Patient alert and oriented with pain controlled. Patient verbalized understanding of discharge instructions. Patient given prescriptions all questions and concerns answered.

## 2015-11-04 NOTE — Telephone Encounter (Signed)
error 

## 2015-11-04 NOTE — Discharge Summary (Signed)
Physician Discharge Summary  Brittany [name] ZOX:[id number] DOB: 1965/09/24 DOA: 10/28/2015  PCP: Evelene [name], MD  Admit date: 10/28/2015 Discharge date: 11/03/2015  Admitted From: Home.  Disposition:  Home.   Recommendations for Outpatient Follow-up:  1. Follow up with PCP in 1-2 weeks 2. Please obtain BMP/CBC in one week 3. Please follow up on the following pending results:  Home Health:NO Equipment/Devices:*NONE   Discharge Condition:STABLE.  CODE STATUS:FULL CODE.  Diet recommendation:regular.   Brief/Interim Summary: 50 year old woman with anxiety and bipolar disorder with a history of depression and recurrent overdoses in the past was brought in by EMS after being found unresponsive. On EMS arrival she was seated in a chair with snoring respirations and unresponsive. Per her significant other, she took a handful of pills mostly baclofen possibly Lamictal and Klonopin.  She was intubated shortly after arrival to the emergency room with minimal medication. [facility] M asked to admit Review of her chart shows recurrent episodes of overdose last in 07/2015 where she was discharged home after psychiatric evaluation. Prior to this in 05/2015 she was admitted for inpatient behavioral health after a similar overdose.  She was transferred to [facility] service for further evaluation.   Discharge Diagnoses:  Principal Problem:   Baclofen overdose  Acute respiratory failure with hypoxia , intubated for airway protection from possible baclofen overdose.  Extubated and on RA.    Hypertension:  Well controlled.  Stopped the hydralazine and lisinopril.   Hypokalemia: repleted as needed.   Depression,  No suicidal intent , she reports baclofen over dose is from the headache and the tooth pain and that she was not trying to harm herself.  Psychiatry consulted and recommended outpatient follow up .  Social worker gave her the resources.  Meanwhile she absolutely refused to take the  cabamazepine twice daily as recommended, switched it back to the home dose.   Trigeminal neuralgia: resumed home meds  Ordered Fioricet, and decreased the dose of gabapentin.   Discharge Instructions  Discharge Instructions    Diet general    Complete by:  As directed    Discharge instructions    Complete by:  As directed    Follow up with PCP in one week.   Increase activity slowly    Complete by:  As directed        Medication List    STOP taking these medications   LISINOPRIL PO   methocarbamol 500 MG tablet Commonly known as:  ROBAXIN     TAKE these medications   b complex vitamins tablet Take 1 tablet by mouth daily.   baclofen 10 MG tablet Commonly known as:  LIORESAL Take 10 mg by mouth 3 (three) times daily as needed for spasms.   butalbital-acetaminophen-caffeine 50-325-40 MG tablet Commonly known as:  FIORICET, ESGIC Take 1 tablet by mouth 2 (two) times daily as needed for headache.   Carbamazepine 300 MG Cp12 Commonly known as:  EQUETRO Take 1 capsule (300 mg total) by mouth daily.   carisoprodol 250 MG tablet Commonly known as:  SOMA Take 250 mg by mouth 2 (two) times daily.   clonazePAM 1 MG tablet Commonly known as:  KLONOPIN Takes 1 tablet by mouth four times a day   DULoxetine 60 MG capsule Commonly known as:  CYMBALTA Take 1 capsule (60 mg total) by mouth 2 (two) times daily.   fluticasone 50 MCG/ACT nasal spray Commonly known as:  FLONASE Place 1 spray into both nostrils daily.   gabapentin 400 MG capsule Commonly  known as:  NEURONTIN Take 1 capsule (400 mg total) by mouth 3 (three) times daily. What changed:  how much to take   hydrOXYzine 50 MG tablet Commonly known as:  ATARAX/VISTARIL Take 1 tablet (50 mg total) by mouth 3 (three) times daily as needed for anxiety (sleep).   lamoTRIgine 100 MG tablet Commonly known as:  LAMICTAL Take 100 mg by mouth 2 (two) times daily.   levothyroxine 100 MCG tablet Commonly known as:   SYNTHROID, LEVOTHROID Take 1 tablet (100 mcg total) by mouth daily before breakfast.   multivitamin with minerals Tabs tablet Take 1 tablet by mouth daily.   PROAIR HFA 108 (90 Base) MCG/ACT inhaler Generic drug:  albuterol Take 2 puffs by mouth every 4 (four) hours as needed.   QVAR 80 MCG/ACT inhaler Generic drug:  beclomethasone Take 2 puffs by mouth 2 (two) times daily.   thiamine 100 MG tablet Take 1 tablet (100 mg total) by mouth daily.   traMADol 50 MG tablet Commonly known as:  ULTRAM Take 50 mg by mouth every 6 (six) hours as needed (pain).   Vitamin D (Ergocalciferol) 50000 units Caps capsule Commonly known as:  DRISDOL Take 50,000 Units by mouth.   VITAMIN D PO Take 1 tablet by mouth daily. Reported on 05/26/2015      Follow-up Information    [name], [name], MD .   Specialty:  Family Medicine Contact information: [facility] [place] [id number] [phone]          Allergies  Allergen Reactions  . Toradol [Ketorolac Tromethamine] Swelling  . Suboxone [Buprenorphine Hcl-Naloxone Hcl] Other (See Comments)    cellulitis   . Trazodone And Nefazodone Other (See Comments)    Bladder infection    Consultations:  PCCm  Psychiatry.    Procedures/Studies: Ct Head Wo Contrast  Result Date: 10/29/2015 CLINICAL DATA:  Acute encephalopathy. EXAM: CT HEAD WITHOUT CONTRAST TECHNIQUE: Contiguous axial images were obtained from the base of the skull through the vertex without intravenous contrast. COMPARISON:  08/02/2015 FINDINGS: Brain: No acute intracranial abnormality. Specifically, no hemorrhage, hydrocephalus, mass lesion, acute infarction, or significant intracranial injury. Vascular: No hyperdense vessel or unexpected calcification. Skull: No acute calvarial abnormality. Sinuses/Orbits: Visualized paranasal sinuses and mastoids clear. Orbital soft tissues unremarkable. Other: None IMPRESSION: Negative. Electronically Signed   By: Charlett NoseKevin  Dover M.D.    On: 10/29/2015 17:18   Dg Chest Port 1 View  Result Date: 10/30/2015 CLINICAL DATA:  Acute respiratory failure. Subsequent encounter. History of bronchitis. EXAM: PORTABLE CHEST 1 VIEW COMPARISON:  10/28/2015 and older studies. FINDINGS: Cardiac silhouette is borderline enlarged. Normal mediastinal and hilar contours. Mild streaky medial lung base opacity consistent with atelectasis. Lungs otherwise clear. No pleural effusion or pneumothorax. Endotracheal tube tip projects 5.5 cm above the carina, without change. Nasal/orogastric tube passes below the diaphragm well into stomach. IMPRESSION: 1. Stable support apparatus. 2. Medial lung base opacities most consistent with atelectasis. Left suprahilar opacity noted on the prior study has improved. Electronically Signed   By: Amie Portlandavid  Ormond M.D.   On: 10/30/2015 07:24   Dg Chest Port 1 View  Result Date: 10/29/2015 CLINICAL DATA:  Encounter for OG tube placement. EXAM: PORTABLE CHEST 1 VIEW COMPARISON:  Chest radiograph 08/04/2015 FINDINGS: Endotracheal tube is 5.3 cm from the carina. Enteric tube in place, tip and side port below the diaphragm in the stomach. Lung volumes are low. The heart size and mediastinal contours are unchanged with probable mild cardiomegaly. Ill-defined right infrahilar, retrocardiac, and  left suprahilar opacities. Difficult to exclude small left pleural effusion. No evidence pneumothorax. No acute osseous abnormality is seen. IMPRESSION: 1. Endotracheal tube 5.3 cm from the carina. Tip and side port of the enteric tube below the diaphragm in the stomach. 2. Multifocal ill-defined opacities right infrahilar, retrocardiac, and left suprahilar regions. This may be atelectasis, aspiration or pneumonia. Electronically Signed   By: Rubye Oaks M.D.   On: 10/29/2015 00:12       Subjective: denie any headache or chest pain.   Discharge Exam: Vitals:   11/03/15 0628 11/03/15 1413  BP: 118/77 122/74  Pulse: 77 89  Resp: 16 15   Temp: 98.3 F (36.8 C)    Vitals:   11/02/15 1642 11/02/15 2224 11/03/15 0628 11/03/15 1413  BP: 122/70 123/72 118/77 122/74  Pulse: 92 91 77 89  Resp: 14 16 16 15   Temp: 99.5 F (37.5 C) 97.6 F (36.4 C) 98.3 F (36.8 C)   TempSrc: Oral Oral Oral   SpO2: 95% 96% 98% 95%  Weight: 102.3 kg (225 lb 8.5 oz)     Height: 5\' 4"  (1.626 m)       General: Pt is alert, awake, not in acute distress Cardiovascular: RRR, S1/S2 +, no rubs, no gallops Respiratory: CTA bilaterally, no wheezing, no rhonchi Abdominal: Soft, NT, ND, bowel sounds + Extremities: no edema, no cyanosis    The results of significant diagnostics from this hospitalization (including imaging, microbiology, ancillary and laboratory) are listed below for reference.     Microbiology: Recent Results (from the past 240 hour(s))  MRSA PCR Screening     Status: None   Collection Time: 10/29/15 12:39 AM  Result Value Ref Range Status   MRSA by PCR NEGATIVE NEGATIVE Final    Comment:        The GeneXpert MRSA Assay (FDA approved for NASAL specimens only), is one component of a comprehensive MRSA colonization surveillance program. It is not intended to diagnose MRSA infection nor to guide or monitor treatment for MRSA infections.      Labs: BNP (last 3 results) No results for input(s): BNP in the last 8760 hours. Basic Metabolic Panel:  Recent Labs Lab 10/28/15 2110 10/28/15 2124 10/29/15 0319  NA 139 140 139  K 3.5 3.5 3.9  CL 105 102 106  CO2 25  --  25  GLUCOSE 97 106* 98  BUN 9 8 9   CREATININE 0.71 0.90 0.69  CALCIUM 8.8*  --  8.4*  MG  --   --  1.9  PHOS  --   --  3.9   Liver Function Tests:  Recent Labs Lab 10/28/15 2110  AST 21  ALT 24  ALKPHOS 74  BILITOT 0.4  PROT 8.6*  ALBUMIN 4.5   No results for input(s): LIPASE, AMYLASE in the last 168 hours. No results for input(s): AMMONIA in the last 168 hours. CBC:  Recent Labs Lab 10/28/15 2110 10/28/15 2124 10/29/15 0319  10/30/15 0338  WBC 9.7  --  7.5 6.8  NEUTROABS 5.7  --   --   --   HGB 13.6 14.3 12.5 12.4  HCT 39.7 42.0 38.3 38.9  MCV 89.8  --  91.8 95.6  PLT 283  --  282 234   Cardiac Enzymes:  Recent Labs Lab 10/28/15 2110  TROPONINI <0.03   BNP: Invalid input(s): POCBNP CBG:  Recent Labs Lab 10/28/15 2126 10/29/15 0347  GLUCAP 99 113*   D-Dimer No results for input(s): DDIMER in the last  72 hours. Hgb A1c No results for input(s): HGBA1C in the last 72 hours. Lipid Profile No results for input(s): CHOL, HDL, LDLCALC, TRIG, CHOLHDL, LDLDIRECT in the last 72 hours. Thyroid function studies No results for input(s): TSH, T4TOTAL, T3FREE, THYROIDAB in the last 72 hours.  Invalid input(s): FREET3 Anemia work up No results for input(s): VITAMINB12, FOLATE, FERRITIN, TIBC, IRON, RETICCTPCT in the last 72 hours. Urinalysis    Component Value Date/Time   COLORURINE YELLOW 10/28/2015 2122   APPEARANCEUR CLEAR 10/28/2015 2122   APPEARANCEUR Clear 04/25/2011 1804   LABSPEC 1.009 10/28/2015 2122   LABSPEC 1.024 04/25/2011 1804   PHURINE 5.5 10/28/2015 2122   GLUCOSEU NEGATIVE 10/28/2015 2122   GLUCOSEU Negative 04/25/2011 1804   HGBUR NEGATIVE 10/28/2015 2122   BILIRUBINUR NEGATIVE 10/28/2015 2122   BILIRUBINUR Negative 04/25/2011 1804   KETONESUR NEGATIVE 10/28/2015 2122   PROTEINUR NEGATIVE 10/28/2015 2122   UROBILINOGEN 0.2 12/23/2014 1048   NITRITE NEGATIVE 10/28/2015 2122   LEUKOCYTESUR NEGATIVE 10/28/2015 2122   LEUKOCYTESUR Negative 04/25/2011 1804   Sepsis Labs Invalid input(s): PROCALCITONIN,  WBC,  LACTICIDVEN Microbiology Recent Results (from the past 240 hour(s))  MRSA PCR Screening     Status: None   Collection Time: 10/29/15 12:39 AM  Result Value Ref Range Status   MRSA by PCR NEGATIVE NEGATIVE Final    Comment:        The GeneXpert MRSA Assay (FDA approved for NASAL specimens only), is one component of a comprehensive MRSA colonization surveillance  program. It is not intended to diagnose MRSA infection nor to guide or monitor treatment for MRSA infections.      Time coordinating discharge: Over 30 minutes  SIGNED:   Kathlen Mody, MD  [facility] 11/04/2015, 9:00 AM Pager   If 7PM-7AM, please contact night-coverage [email or web address] Password [email or web address]

## 2015-12-21 ENCOUNTER — Telehealth: Payer: Self-pay

## 2015-12-21 NOTE — Telephone Encounter (Signed)
Attempted to reach pt to cancel next weeks appointment. Dr Everlena CooperJaffe said he has nothing to offer her to help. Pt needs pain management to take over treatment

## 2015-12-22 ENCOUNTER — Encounter (HOSPITAL_COMMUNITY): Payer: Self-pay | Admitting: Emergency Medicine

## 2015-12-22 ENCOUNTER — Emergency Department (HOSPITAL_COMMUNITY): Payer: Medicaid Other

## 2015-12-22 ENCOUNTER — Inpatient Hospital Stay (HOSPITAL_COMMUNITY)
Admission: EM | Admit: 2015-12-22 | Discharge: 2015-12-24 | DRG: 917 | Disposition: A | Discharge status: Home / Self Care | Payer: Medicaid Other | Attending: Internal Medicine | Admitting: Internal Medicine

## 2015-12-22 DIAGNOSIS — T428X1A Poisoning by antiparkinsonism drugs and other central muscle-tone depressants, accidental (unintentional), initial encounter: Secondary | ICD-10-CM | POA: Diagnosis not present

## 2015-12-22 DIAGNOSIS — T50904A Poisoning by unspecified drugs, medicaments and biological substances, undetermined, initial encounter: Principal | ICD-10-CM | POA: Diagnosis present

## 2015-12-22 DIAGNOSIS — E039 Hypothyroidism, unspecified: Secondary | ICD-10-CM | POA: Diagnosis present

## 2015-12-22 DIAGNOSIS — T424X1A Poisoning by benzodiazepines, accidental (unintentional), initial encounter: Secondary | ICD-10-CM | POA: Diagnosis not present

## 2015-12-22 DIAGNOSIS — B182 Chronic viral hepatitis C: Secondary | ICD-10-CM | POA: Diagnosis present

## 2015-12-22 DIAGNOSIS — F17200 Nicotine dependence, unspecified, uncomplicated: Secondary | ICD-10-CM | POA: Diagnosis present

## 2015-12-22 DIAGNOSIS — Z8669 Personal history of other diseases of the nervous system and sense organs: Secondary | ICD-10-CM | POA: Diagnosis not present

## 2015-12-22 DIAGNOSIS — R4182 Altered mental status, unspecified: Secondary | ICD-10-CM | POA: Diagnosis present

## 2015-12-22 DIAGNOSIS — F1410 Cocaine abuse, uncomplicated: Secondary | ICD-10-CM | POA: Diagnosis not present

## 2015-12-22 DIAGNOSIS — R748 Abnormal levels of other serum enzymes: Secondary | ICD-10-CM | POA: Diagnosis not present

## 2015-12-22 DIAGNOSIS — Z22322 Carrier or suspected carrier of Methicillin resistant Staphylococcus aureus: Secondary | ICD-10-CM | POA: Diagnosis not present

## 2015-12-22 DIAGNOSIS — G92 Toxic encephalopathy: Secondary | ICD-10-CM | POA: Diagnosis not present

## 2015-12-22 DIAGNOSIS — T428X2A Poisoning by antiparkinsonism drugs and other central muscle-tone depressants, intentional self-harm, initial encounter: Secondary | ICD-10-CM | POA: Diagnosis not present

## 2015-12-22 DIAGNOSIS — T50901A Poisoning by unspecified drugs, medicaments and biological substances, accidental (unintentional), initial encounter: Secondary | ICD-10-CM | POA: Diagnosis present

## 2015-12-22 DIAGNOSIS — R410 Disorientation, unspecified: Secondary | ICD-10-CM | POA: Diagnosis present

## 2015-12-22 DIAGNOSIS — T404X1A Poisoning by other synthetic narcotics, accidental (unintentional), initial encounter: Secondary | ICD-10-CM | POA: Diagnosis not present

## 2015-12-22 DIAGNOSIS — Z915 Personal history of self-harm: Secondary | ICD-10-CM

## 2015-12-22 DIAGNOSIS — T1491XA Suicide attempt, initial encounter: Secondary | ICD-10-CM | POA: Diagnosis not present

## 2015-12-22 DIAGNOSIS — Z79899 Other long term (current) drug therapy: Secondary | ICD-10-CM

## 2015-12-22 DIAGNOSIS — G9340 Encephalopathy, unspecified: Secondary | ICD-10-CM | POA: Diagnosis present

## 2015-12-22 DIAGNOSIS — R7689 Other specified abnormal immunological findings in serum: Secondary | ICD-10-CM | POA: Diagnosis present

## 2015-12-22 DIAGNOSIS — F17210 Nicotine dependence, cigarettes, uncomplicated: Secondary | ICD-10-CM | POA: Diagnosis not present

## 2015-12-22 DIAGNOSIS — G500 Trigeminal neuralgia: Secondary | ICD-10-CM | POA: Diagnosis present

## 2015-12-22 DIAGNOSIS — J189 Pneumonia, unspecified organism: Secondary | ICD-10-CM

## 2015-12-22 DIAGNOSIS — J690 Pneumonitis due to inhalation of food and vomit: Secondary | ICD-10-CM | POA: Diagnosis not present

## 2015-12-22 DIAGNOSIS — R768 Other specified abnormal immunological findings in serum: Secondary | ICD-10-CM | POA: Diagnosis present

## 2015-12-22 DIAGNOSIS — F319 Bipolar disorder, unspecified: Secondary | ICD-10-CM | POA: Diagnosis present

## 2015-12-22 DIAGNOSIS — K759 Inflammatory liver disease, unspecified: Secondary | ICD-10-CM | POA: Diagnosis not present

## 2015-12-22 LAB — PROCALCITONIN: Procalcitonin: 0.37 ng/mL

## 2015-12-22 LAB — URINALYSIS, ROUTINE W REFLEX MICROSCOPIC
BILIRUBIN URINE: NEGATIVE
GLUCOSE, UA: NEGATIVE mg/dL
HGB URINE DIPSTICK: NEGATIVE
Ketones, ur: NEGATIVE mg/dL
Leukocytes, UA: NEGATIVE
Nitrite: NEGATIVE
PROTEIN: NEGATIVE mg/dL
Specific Gravity, Urine: 1.004 — ABNORMAL LOW (ref 1.005–1.030)
pH: 6.5 (ref 5.0–8.0)

## 2015-12-22 LAB — CARBAMAZEPINE LEVEL, TOTAL

## 2015-12-22 LAB — CBC WITH DIFFERENTIAL/PLATELET
BASOS ABS: 0.2 10*3/uL — AB (ref 0.0–0.1)
Basophils Relative: 1 %
Eosinophils Absolute: 0.3 10*3/uL (ref 0.0–0.7)
Eosinophils Relative: 2 %
HEMATOCRIT: 35.2 % — AB (ref 36.0–46.0)
HEMOGLOBIN: 12 g/dL (ref 12.0–15.0)
LYMPHS ABS: 2 10*3/uL (ref 0.7–4.0)
Lymphocytes Relative: 13 %
MCH: 30.8 pg (ref 26.0–34.0)
MCHC: 34.1 g/dL (ref 30.0–36.0)
MCV: 90.3 fL (ref 78.0–100.0)
MONOS PCT: 12 %
Monocytes Absolute: 1.9 10*3/uL — ABNORMAL HIGH (ref 0.1–1.0)
NEUTROS ABS: 11.1 10*3/uL — AB (ref 1.7–7.7)
Neutrophils Relative %: 72 %
Platelets: 236 10*3/uL (ref 150–400)
RBC: 3.9 MIL/uL (ref 3.87–5.11)
RDW: 13.4 % (ref 11.5–15.5)
WBC: 15.5 10*3/uL — ABNORMAL HIGH (ref 4.0–10.5)

## 2015-12-22 LAB — COMPREHENSIVE METABOLIC PANEL
ALBUMIN: 2.9 g/dL — AB (ref 3.5–5.0)
ALK PHOS: 182 U/L — AB (ref 38–126)
ALK PHOS: 144 U/L — AB (ref 38–126)
ALT: 107 U/L — ABNORMAL HIGH (ref 14–54)
ALT: 79 U/L — AB (ref 14–54)
ANION GAP: 14 (ref 5–15)
AST: 127 U/L — ABNORMAL HIGH (ref 15–41)
AST: 68 U/L — ABNORMAL HIGH (ref 15–41)
Albumin: 3.7 g/dL (ref 3.5–5.0)
Anion gap: 8 (ref 5–15)
BILIRUBIN TOTAL: 0.6 mg/dL (ref 0.3–1.2)
BUN: 15 mg/dL (ref 6–20)
BUN: 14 mg/dL (ref 6–20)
CALCIUM: 9.4 mg/dL (ref 8.9–10.3)
CALCIUM: 8.5 mg/dL — AB (ref 8.9–10.3)
CO2: 19 mmol/L — AB (ref 22–32)
CO2: 24 mmol/L (ref 22–32)
CREATININE: 1.02 mg/dL — AB (ref 0.44–1.00)
Chloride: 107 mmol/L (ref 101–111)
Chloride: 111 mmol/L (ref 101–111)
Creatinine, Ser: 1.07 mg/dL — ABNORMAL HIGH (ref 0.44–1.00)
GFR calc Af Amer: >60 mL/min (ref >60)
GFR calc non Af Amer: >60 mL/min (ref >60)
GFR, EST NON AFRICAN AMERICAN: 57 mL/min — AB (ref >60)
GLUCOSE: 86 mg/dL (ref 65–99)
Glucose, Bld: 94 mg/dL (ref 65–99)
Potassium: 3.8 mmol/L (ref 3.5–5.1)
Potassium: 3.7 mmol/L (ref 3.5–5.1)
SODIUM: 140 mmol/L (ref 135–145)
SODIUM: 143 mmol/L (ref 135–145)
TOTAL PROTEIN: 7.9 g/dL (ref 6.5–8.1)
Total Bilirubin: 0.5 mg/dL (ref 0.3–1.2)
Total Protein: 6.5 g/dL (ref 6.5–8.1)

## 2015-12-22 LAB — I-STAT BETA HCG BLOOD, ED (MC, WL, AP ONLY)

## 2015-12-22 LAB — ETHANOL

## 2015-12-22 LAB — I-STAT ARTERIAL BLOOD GAS, ED
Acid-base deficit: 3 mmol/L — ABNORMAL HIGH (ref 0.0–2.0)
BICARBONATE: 21.4 mmol/L (ref 20.0–28.0)
O2 Saturation: 89 %
PH ART: 7.384 (ref 7.350–7.450)
PO2 ART: 57 mmHg — AB (ref 83.0–108.0)
TCO2: 22 mmol/L (ref 0–100)
pCO2 arterial: 35.8 mmHg (ref 32.0–48.0)

## 2015-12-22 LAB — ACETAMINOPHEN LEVEL: Acetaminophen (Tylenol), Serum: <10 ug/mL — ABNORMAL LOW (ref 10–30)

## 2015-12-22 LAB — CK: Total CK: 999 U/L — ABNORMAL HIGH (ref 38–234)

## 2015-12-22 LAB — RAPID URINE DRUG SCREEN, HOSP PERFORMED
Amphetamines: NOT DETECTED
Barbiturates: NOT DETECTED
Benzodiazepines: NOT DETECTED
Cocaine: POSITIVE — AB
OPIATES: NOT DETECTED
Tetrahydrocannabinol: NOT DETECTED

## 2015-12-22 LAB — CBG MONITORING, ED: Glucose-Capillary: 108 mg/dL — ABNORMAL HIGH (ref 65–99)

## 2015-12-22 LAB — PROTIME-INR
INR: 1.05
Prothrombin Time: 13.8 seconds (ref 11.4–15.2)

## 2015-12-22 LAB — APTT: aPTT: 42 seconds — ABNORMAL HIGH (ref 24–36)

## 2015-12-22 LAB — SALICYLATE LEVEL

## 2015-12-22 LAB — MRSA PCR SCREENING: MRSA by PCR: POSITIVE — AB

## 2015-12-22 MED ORDER — MUPIROCIN 2 % EX OINT
1.0000 "application " | TOPICAL_OINTMENT | Freq: Two times a day (BID) | CUTANEOUS | Status: DC
  Administered 2015-12-22 – 2015-12-24 (×5): 1 via NASAL
  Filled 2015-12-22 (×2): qty 22

## 2015-12-22 MED ORDER — WHITE PETROLATUM GEL
Status: AC
  Administered 2015-12-22: 1
  Filled 2015-12-22: qty 1

## 2015-12-22 MED ORDER — SODIUM CHLORIDE 0.9 % IV SOLN
3.0000 g | Freq: Four times a day (QID) | INTRAVENOUS | Status: DC
  Administered 2015-12-22 – 2015-12-23 (×4): 3 g via INTRAVENOUS
  Filled 2015-12-22 (×7): qty 3

## 2015-12-22 MED ORDER — SODIUM CHLORIDE 0.9 % IV SOLN
INTRAVENOUS | Status: DC
  Administered 2015-12-22 – 2015-12-23 (×7): via INTRAVENOUS

## 2015-12-22 MED ORDER — DEXTROSE 5 % IV SOLN
500.0000 mg | Freq: Once | INTRAVENOUS | Status: DC
  Administered 2015-12-22: 500 mg via INTRAVENOUS
  Filled 2015-12-22: qty 500

## 2015-12-22 MED ORDER — NALOXONE HCL 2 MG/2ML IJ SOSY
PREFILLED_SYRINGE | INTRAMUSCULAR | Status: AC
  Filled 2015-12-22: qty 2

## 2015-12-22 MED ORDER — NALOXONE HCL 0.4 MG/ML IJ SOLN: 0.4000 mg | INTRAMUSCULAR | Status: DC | PRN

## 2015-12-22 MED ORDER — ACETAMINOPHEN 650 MG RE SUPP: 650.0000 mg | Freq: Four times a day (QID) | RECTAL | Status: DC | PRN

## 2015-12-22 MED ORDER — ENOXAPARIN SODIUM 40 MG/0.4ML ~~LOC~~ SOLN
40.0000 mg | SUBCUTANEOUS | Status: DC
  Administered 2015-12-22 – 2015-12-24 (×3): 40 mg via SUBCUTANEOUS
  Filled 2015-12-22 (×3): qty 0.4

## 2015-12-22 MED ORDER — NALOXONE HCL 0.4 MG/ML IJ SOLN: 0.4000 mg | Freq: Once | INTRAMUSCULAR | Status: DC

## 2015-12-22 MED ORDER — ACETAMINOPHEN 325 MG PO TABS: 650.0000 mg | ORAL_TABLET | Freq: Four times a day (QID) | ORAL | Status: DC | PRN

## 2015-12-22 MED ORDER — SENNOSIDES-DOCUSATE SODIUM 8.6-50 MG PO TABS: 1.0000 | ORAL_TABLET | Freq: Every evening | ORAL | Status: DC | PRN

## 2015-12-22 MED ORDER — CHLORHEXIDINE GLUCONATE CLOTH 2 % EX PADS
6.0000 | MEDICATED_PAD | Freq: Every day | CUTANEOUS | Status: DC
  Administered 2015-12-22 – 2015-12-24 (×3): 6 via TOPICAL

## 2015-12-22 MED ORDER — CEFTRIAXONE SODIUM 1 G IJ SOLR
1.0000 g | Freq: Once | INTRAMUSCULAR | Status: AC
  Administered 2015-12-22: 1 g via INTRAVENOUS
  Filled 2015-12-22: qty 10

## 2015-12-22 MED ORDER — NALOXONE HCL 2 MG/2ML IJ SOSY
2.0000 mg | PREFILLED_SYRINGE | Freq: Once | INTRAMUSCULAR | Status: AC
Start: 2015-12-22 — End: 2015-12-22
  Administered 2015-12-22: 2 mg via INTRAVENOUS

## 2015-12-22 MED ORDER — SODIUM CHLORIDE 0.9% FLUSH
3.0000 mL | Freq: Two times a day (BID) | INTRAVENOUS | Status: DC
  Administered 2015-12-23 – 2015-12-24 (×3): 3 mL via INTRAVENOUS

## 2015-12-22 MED ORDER — NALOXONE HCL 2 MG/2ML IJ SOSY
2.0000 mg | PREFILLED_SYRINGE | Freq: Once | INTRAMUSCULAR | Status: AC
  Administered 2015-12-22: 2 mg via INTRAVENOUS
  Filled 2015-12-22: qty 2

## 2015-12-22 MED ORDER — LEVOTHYROXINE SODIUM 100 MCG IV SOLR
50.0000 ug | Freq: Every day | INTRAVENOUS | Status: DC
  Administered 2015-12-23: 50 ug via INTRAVENOUS
  Filled 2015-12-22: qty 5

## 2015-12-22 NOTE — H&P (Signed)
Date: 12/22/2015               Patient Name:  Brittany [name] MRN: [id number]  DOB: 03/29/1957 Age / Sex: 50 y.o., female   PCP: No primary care provider on file.         Medical Service: Internal Medicine Teaching Service         Attending Physician: Dr. Lucious Groves, DO    First Contact: Dr. Wynetta Emery Pager: [phone]  Second Contact: Dr. Tiburcio Pea Pager: [phone]       After Hours (After 5p/  First Contact Pager: [phone]  weekends / holidays): Second Contact Pager: [phone]   Chief Complaint: AMS  History of Present Illness: 50 year old woman presenting after probable overdose. History obtained from EMS/ED provider/EMR due to acute encephalopathy. She was found by her husband this morning with a change in her mental status. He reviewed her medications and thinks she took a large quantity of baclofen (possibly >60 tablets). En route with EMS, she became more somnolent. She was given supplemental oxygen. Blood glucose was normal.   No family at bedside. There are two bottles labeled tramadol 29m and baclofen 138m The bottle of tramadol contained some clonzepam 98m8mThese were prescribed to her by Dr. KenBernita Raisinsychiatry).  Upon review of EMR, her old chart was found. She was admitted 9/14 to 9/20 for overdose. At that time she took baclofen and possibly Lamictal and Klonopin. She was seen by psychiatry as inpatient (Dr. JonLouretta Shortennd [facility] HeaRosine Doorhe has had 5 other hospitalizations prior to that for overdose since January 2017.  [place] Controlled Substance Database lists a dispense on 10/31 for clonazepam 1 mg 90 tablets, on 10/26 tramadol 50 mg 90 tablets, on 10/2 clonazepam 1 mg 120 tablets, on 9/7 Soma 250 mg 60 tablets.  Meds:  Per EMR, last medication list: Vitamin D Tramadol Thiamine Qvar Pro  Air Multivitamin Synthroid Lamictal Atarax Gabapentin Flonase Duloxetine Clonazepam Soma Carbamazepine Fioricet Baclofen B complex vitamins  Allergies: Allergies as of 12/22/2015  . (No Known Allergies)   History reviewed. No pertinent past medical history.  Family History: Unknown due to mental status  Social History: Unknown due to mental status  Review of Systems: Unable to complete ROS due to mental status  Physical Exam: Blood pressure 147/87, pulse 89, temperature (!) 96.9 F (36.1 C), temperature source Rectal, resp. rate 18, height 5' 6"  (1.676 m), weight 250 lb (113.4 kg), SpO2 100 %. General Apperance: NAD, somnolent Head: Normocephalic, atraumatic Eyes: Pupils were 4mm55mluggish but reactive to light, disconjugate, anicteric sclera Ears: Normal external ear canal Nose: Nares normal, septum midline, mucosa normal Throat: Lips, mucosa and tongue normal  Neck: Supple, trachea midline Back: No tenderness or bony abnormality  Lungs: Clear to auscultation bilaterally.  Chest Wall: Nontender, no deformity Heart: Regular rate and rhythm Abdomen: Soft, nontender, nondistended, no rebound/guarding Extremities: Normal, atraumatic, warm and well perfused, no edema Pulses: 2+ throughout Skin: No rashes or lesions Neurologic: Somnolent prior to narcan. After narcan, she opened eyes spontaneously but remained confused. Incomprehensible verbal response. Withdraws to pain. Moves all extremities spontaneously. Mute babinski.   EKG: NSR. No widened QRS or prolonged QTc. No previous EKG for comparison.  CXR: Airspace consolidation in right mid and lower lungs.  Assessment & Plan by Problem: 58 y7r old woman presenting after probable overdose.   Polysubstance overdose: History consistent with overdose with possible tramadol, baclofen and  clonazepam. Unknown psychiatric history but she is prescribed at least two of these medications by psychiatry. Somnolent but becomes  more alert with Narcan. ABG with normal pH, pCO2, and Bicarb. No anion gap on BMP. Acetaminophen and salicylate lvl negative. EKG unremarkable. Case discussed with poison control by ED.  -Admit to SDU for close monitoring -Will need to monitor for seizures given tramadol and baclofen ingestion -Repeat CMP in 8 hours and if elevating will need n-acetylcysteine -NPO, Normal saline @150ml /hr -Narcan prn -Suicide precautions -Will need reevaluation for suicidal ideation when she is more alert and awake  Aspiration pneumonia: Given history and distribution of consolidation, concerning for aspiration. She was given ceftriaxone in ED. Procalcitonin 0.37. Leukocytosis to 15.5 and temp of 95.22F.  -Follow up blood cultures -Change to Unasyn  Elevated CK: CK 999. UA negative. Nonspecific and likely related to above. -Continue IV fluids  Mild hepatitis: Alk phos 182, AST 127, ALT 107. Likely related to above. Alcohol level negative.  -Continue supportive care -Hep B and C screening  History of seizures:  -Seizure precautions -Check lamictal and carbapazempine levels  Hypothyroidism: Was on synthroid 149mg as of September. Will continue this as IV 520m.   VTE ppx: Subq Lovenox Code status: FULL  Dispo: Admit patient to Inpatient with expected length of stay greater than 2 midnights.  Signed: JeMilagros LollMD 12/22/2015, 8:06 AM  Pager: [phone]

## 2015-12-22 NOTE — ED Notes (Signed)
Zithromax is not compatible with Rocephin. Poor IV access.

## 2015-12-22 NOTE — ED Triage Notes (Signed)
[facility].  All she could say to him was that "I'm fine".  He found her to have taken an overdose of her medication.  Upon EMS arrival, noted that she had taken 6768 10mg  EMS states that en route to ED, patient became more somnolent and stopped talking.  EMS stated that they started assisting ventilations en route.  Patient with snoring respirations upon arrival.

## 2015-12-22 NOTE — ED Notes (Signed)
Admitting provider at bedside.

## 2015-12-22 NOTE — ED Notes (Signed)
Patient awake, answering questions but continues to be confused.

## 2015-12-22 NOTE — Telephone Encounter (Signed)
Attempted to reach pt again to cancel appt. VM left on home number. Cell is not active.

## 2015-12-22 NOTE — ED Notes (Signed)
Pt is moving side to side in bed, spontaneously and purposefully moving her arms around. Pt will not follow commands or answer any questions.

## 2015-12-22 NOTE — ED Provider Notes (Signed)
[facility]-EMERGENCY DEPT Provider Note   CSN: [id number] Arrival date & time: 12/22/15  0557     History   Chief Complaint Chief Complaint  Patient presents with  . Altered Mental Status   LEVEL 5 CAVEAT DUE TO ALTERED MENTAL STATUS  HPI Brittany [name] is a 50 y.o. female.  The history is provided by the patient and the EMS personnel. The history is limited by the condition of the patient.  Altered Mental Status   This is a new problem. Episode onset: unknown time ago. The problem has been rapidly worsening. Associated symptoms include confusion and somnolence. Risk factors include the patient not taking medications correctly.  Patient presents via EMS for altered mental status, concern for overdose She was found by husband this morning with change in mental status, and when reviewing her medications, it appeared that she took large quantity of baclofen (possibly > 60 tablets) Per EMS, she became more somnolent en route and required oxygenation  glucose was normal per EMS.  She was not given narcan   PMH - unknown Soc hx - unknown OB History    No data available       Home Medications    Prior to Admission medications   Not on File    Family History No family history on file.  Social History Social History  Substance Use Topics  . Smoking status: Current Every Day Smoker  . Smokeless tobacco: Never Used  . Alcohol use No     Allergies   Patient has no known allergies.   Review of Systems Review of Systems  Unable to perform ROS: Mental status change  Psychiatric/Behavioral: Positive for confusion.     Physical Exam Updated Vital Signs BP 147/87 (BP Location: Left Arm)   Pulse 89   Temp (!) 95.6 F (35.3 C) (Rectal)   Resp 18   Ht 5\' 6"  (1.676 m)   Wt 113.4 kg   LMP  (LMP Unknown)   SpO2 100%   BMI 40.35 kg/m   Physical Exam CONSTITUTIONAL: somnolent, disheveled HEAD: Normocephalic/atraumatic, no signs of trauma EYES: pupils midsize/equal  bilaterally.  Right eye deviated to right.  No nystagmus ENMT: Mucous membranes moist NECK: supple no meningeal signs SPINE/BACK:No bruising/crepitance/stepoffs noted to spine CV: S1/S2 noted, no murmurs/rubs/gallops noted LUNGS: Lungs are clear to auscultation bilaterally, no apparent distress ABDOMEN: soft NEURO: Pt is somnolent.  She does not speak or open eyes.  She localizes pain.  GCS = 7.   EXTREMITIES: pulses normal/equal,no deformities noted SKIN: skin is cool to touch PSYCH: unable to assess  ED Treatments / Results  Labs (all labs ordered are listed, but only abnormal results are displayed) Labs Reviewed  ACETAMINOPHEN LEVEL - Abnormal; Notable for the following:       Result Value   Acetaminophen (Tylenol), Serum <10 (*)    All other components within normal limits  COMPREHENSIVE METABOLIC PANEL - Abnormal; Notable for the following:    CO2 19 (*)    Creatinine, Ser 1.07 (*)    AST 127 (*)    ALT 107 (*)    Alkaline Phosphatase 182 (*)    GFR calc non Af Amer 57 (*)    All other components within normal limits  CBC WITH DIFFERENTIAL/PLATELET - Abnormal; Notable for the following:    WBC 15.5 (*)    HCT 35.2 (*)    Neutro Abs 11.1 (*)    Monocytes Absolute 1.9 (*)    Basophils Absolute 0.2 (*)  All other components within normal limits  I-STAT ARTERIAL BLOOD GAS, ED - Abnormal; Notable for the following:    pO2, Arterial 57.0 (*)    Acid-base deficit 3.0 (*)    All other components within normal limits  CBG MONITORING, ED - Abnormal; Notable for the following:    Glucose-Capillary 108 (*)    All other components within normal limits  CULTURE, BLOOD (ROUTINE X 2)  CULTURE, BLOOD (ROUTINE X 2)  ETHANOL  SALICYLATE LEVEL  RAPID URINE DRUG SCREEN, HOSP PERFORMED  URINALYSIS, ROUTINE W REFLEX MICROSCOPIC (NOT AT [facility])  CK  COMPREHENSIVE METABOLIC PANEL  PROCALCITONIN  HIV ANTIBODY (ROUTINE TESTING)  HEPATITIS C ANTIBODY  I-STAT BETA HCG BLOOD, ED ([facility], [facility],  [facility] ONLY)    EKG  EKG Interpretation  Date/Time:  Wednesday December 22 2015 05:58:54 EST Ventricular Rate:  82 PR Interval:    QRS Duration: 95 QT Interval:  391 QTC Calculation: 457 R Axis:   30 Text Interpretation:  Sinus rhythm Non-specific ST-t changes No previous ECGs available Confirmed by Bebe ShaggyWICKLINE  MD, Hazley Dezeeuw ([id number]) on 12/22/2015 6:16:30 AM       Radiology Dg Chest Port 1 View  Result Date: 12/22/2015 CLINICAL DATA:  Overdose.  Altered mental status. EXAM: PORTABLE CHEST 1 VIEW COMPARISON:  None. FINDINGS: Normal heart size and pulmonary vascularity. Airspace infiltration in the right mid and lower lung zone. This could represent pneumonia or aspiration. Left lung is clear. No blunting of costophrenic angles. No pneumothorax. Mediastinal contours appear intact. IMPRESSION: Airspace consolidation in the right mid and lower lungs. This could represent pneumonia or aspiration. Electronically Signed   By: Burman NievesWilliam  Stevens M.D.   On: 12/22/2015 06:33    Procedures Procedures  CRITICAL CARE Performed by: Joya GaskinsWICKLINE,Jereld Presti W Total critical care time: 45 minutes Critical care time was exclusive of separately billable procedures and treating other patients. Critical care was necessary to treat or prevent imminent or life-threatening deterioration. Critical care was time spent personally by me on the following activities: development of treatment plan with patient and/or surrogate as well as nursing, discussions with consultants, evaluation of patient's response to treatment, examination of patient, obtaining history from patient or surrogate, ordering and performing treatments and interventions, ordering and review of laboratory studies, ordering and review of radiographic studies, pulse oximetry and re-evaluation of patient's condition. PATIENT WITH POLYSUBSTANCE OVERDOSE, REQUIRING MONITORING, NARCAN AND ADMISSION  Medications Ordered in ED Medications  cefTRIAXone (ROCEPHIN) 1 g in  dextrose 5 % 50 mL IVPB (not administered)  azithromycin (ZITHROMAX) 500 mg in dextrose 5 % 250 mL IVPB (not administered)  naloxone (NARCAN) injection 2 mg (not administered)  naloxone (NARCAN) injection 2 mg (2 mg Intravenous Given 12/22/15 0608)     Initial Impression / Assessment and Plan / ED Course  I have reviewed the triage vital signs and the nursing notes.  Pertinent labs & imaging results that were available during my care of the patient were reviewed by me and considered in my medical decision making (see chart for details).  Clinical Course     6:31 AM Pt seen on arrival for AMS with presumed overdose Pt with initial GCS 7 Glucose >100 She was given narcan and patient woke up immediately.  She is now awake/alert, but appears confused After further evaluation of her meds, she was also taking tramadol/baclofen and klonopin 7:21 AM Pt more awake/alert but confused She moves all extremities This appears all c/w with polysubstance overdose, will defer CT head I spoke to poison  control - beware seizures given that patient has taken tramadol and baclofen She will need repeat LFTs in 8 hours - if elevating will need n-aceytlcysteine   Pt also noted to have pneumonia, antibiotics ordered  7:37 AM D/W INTERNAL MEDICINE TEAM, WILL AD[facility]   Pt becoming more somnolent but will respond to pain Narcan ordered Pt is protecting airway at this time, no significant hypoxia Awaiting admission to stepdown unit  Final Clinical Impressions(s) / ED Diagnoses   Final diagnoses:  Delirium  Community acquired pneumonia of right lung, unspecified part of lung  Drug overdose, undetermined intent, initial encounter    New Prescriptions New Prescriptions   No medications on file     Zadie Rhineonald Hari Casaus, MD 12/22/15 [phone]

## 2015-12-22 NOTE — ED Notes (Signed)
Attempted to call report

## 2015-12-22 NOTE — Progress Notes (Signed)
   12/22/15 1600  Clinical Encounter Type  Visited With Patient  Visit Type Initial  Referral From Nurse  Consult/Referral To Chaplain  Stress Factors  Patient Stress Factors None identified    Chaplain responded to [place] consult. Pt not able to respond or communicate at this time.

## 2015-12-22 NOTE — Progress Notes (Signed)
RN Spoke with a representative from [employer], and they suggested that the MD re-order a liver function test 8 hours from the initial result, that we monitor the patient for seizures, brain death symptoms and that seizures should treated with benzos first line, followed by phenobarbital as a second line. MD Laural BenesJohnson made aware and new orders received. Will continue to monitor patient closely.

## 2015-12-23 DIAGNOSIS — F1410 Cocaine abuse, uncomplicated: Secondary | ICD-10-CM

## 2015-12-23 DIAGNOSIS — J690 Pneumonitis due to inhalation of food and vomit: Secondary | ICD-10-CM

## 2015-12-23 DIAGNOSIS — F319 Bipolar disorder, unspecified: Secondary | ICD-10-CM | POA: Diagnosis present

## 2015-12-23 DIAGNOSIS — K759 Inflammatory liver disease, unspecified: Secondary | ICD-10-CM

## 2015-12-23 DIAGNOSIS — F17210 Nicotine dependence, cigarettes, uncomplicated: Secondary | ICD-10-CM

## 2015-12-23 DIAGNOSIS — T428X1A Poisoning by antiparkinsonism drugs and other central muscle-tone depressants, accidental (unintentional), initial encounter: Secondary | ICD-10-CM

## 2015-12-23 DIAGNOSIS — T424X1A Poisoning by benzodiazepines, accidental (unintentional), initial encounter: Secondary | ICD-10-CM

## 2015-12-23 DIAGNOSIS — Z79899 Other long term (current) drug therapy: Secondary | ICD-10-CM

## 2015-12-23 DIAGNOSIS — T1491XA Suicide attempt, initial encounter: Secondary | ICD-10-CM

## 2015-12-23 DIAGNOSIS — T404X1A Poisoning by other synthetic narcotics, accidental (unintentional), initial encounter: Secondary | ICD-10-CM

## 2015-12-23 DIAGNOSIS — E039 Hypothyroidism, unspecified: Secondary | ICD-10-CM

## 2015-12-23 DIAGNOSIS — G500 Trigeminal neuralgia: Secondary | ICD-10-CM | POA: Diagnosis present

## 2015-12-23 DIAGNOSIS — Z8669 Personal history of other diseases of the nervous system and sense organs: Secondary | ICD-10-CM

## 2015-12-23 DIAGNOSIS — R748 Abnormal levels of other serum enzymes: Secondary | ICD-10-CM

## 2015-12-23 DIAGNOSIS — Z915 Personal history of self-harm: Secondary | ICD-10-CM

## 2015-12-23 DIAGNOSIS — T428X2A Poisoning by antiparkinsonism drugs and other central muscle-tone depressants, intentional self-harm, initial encounter: Secondary | ICD-10-CM

## 2015-12-23 LAB — COMPREHENSIVE METABOLIC PANEL
ALT: 64 U/L — ABNORMAL HIGH (ref 14–54)
AST: 51 U/L — AB (ref 15–41)
Albumin: 2.6 g/dL — ABNORMAL LOW (ref 3.5–5.0)
Alkaline Phosphatase: 124 U/L (ref 38–126)
Anion gap: 10 (ref 5–15)
BILIRUBIN TOTAL: 0.4 mg/dL (ref 0.3–1.2)
BUN: 9 mg/dL (ref 6–20)
CO2: 22 mmol/L (ref 22–32)
Calcium: 8.1 mg/dL — ABNORMAL LOW (ref 8.9–10.3)
Chloride: 107 mmol/L (ref 101–111)
Creatinine, Ser: 0.99 mg/dL (ref 0.44–1.00)
GFR calc Af Amer: >60 mL/min (ref >60)
Glucose, Bld: 121 mg/dL — ABNORMAL HIGH (ref 65–99)
POTASSIUM: 3.1 mmol/L — AB (ref 3.5–5.1)
Sodium: 139 mmol/L (ref 135–145)
TOTAL PROTEIN: 6.2 g/dL — AB (ref 6.5–8.1)

## 2015-12-23 LAB — CBC
HEMATOCRIT: 31.9 % — AB (ref 36.0–46.0)
Hemoglobin: 10.4 g/dL — ABNORMAL LOW (ref 12.0–15.0)
MCH: 29.8 pg (ref 26.0–34.0)
MCHC: 32.6 g/dL (ref 30.0–36.0)
MCV: 91.4 fL (ref 78.0–100.0)
PLATELETS: 230 10*3/uL (ref 150–400)
RBC: 3.49 MIL/uL — ABNORMAL LOW (ref 3.87–5.11)
RDW: 13.8 % (ref 11.5–15.5)
WBC: 12.1 10*3/uL — AB (ref 4.0–10.5)

## 2015-12-23 LAB — HEPATITIS B CORE ANTIBODY, TOTAL: HEP B C TOTAL AB: NEGATIVE

## 2015-12-23 LAB — HEPATITIS B SURFACE ANTIGEN: HEP B S AG: NEGATIVE

## 2015-12-23 LAB — HIV ANTIBODY (ROUTINE TESTING W REFLEX): HIV Screen 4th Generation wRfx: NONREACTIVE

## 2015-12-23 LAB — HEPATITIS C ANTIBODY: HCV AB: 2.3 {s_co_ratio} — AB (ref 0.0–0.9)

## 2015-12-23 LAB — CK: Total CK: 415 U/L — ABNORMAL HIGH (ref 38–234)

## 2015-12-23 LAB — HEPATITIS B SURFACE ANTIBODY, QUANTITATIVE

## 2015-12-23 LAB — LAMOTRIGINE LEVEL: Lamotrigine Lvl: NOT DETECTED ug/mL (ref 2.0–20.0)

## 2015-12-23 MED ORDER — GABAPENTIN 300 MG PO CAPS
300.0000 mg | ORAL_CAPSULE | Freq: Three times a day (TID) | ORAL | Status: DC
  Administered 2015-12-23 – 2015-12-24 (×3): 300 mg via ORAL
  Filled 2015-12-23 (×3): qty 1

## 2015-12-23 MED ORDER — LORAZEPAM 2 MG/ML IJ SOLN
0.5000 mg | Freq: Once | INTRAMUSCULAR | Status: AC
  Administered 2015-12-23: 0.5 mg via INTRAVENOUS
  Filled 2015-12-23: qty 1

## 2015-12-23 MED ORDER — LEVOTHYROXINE SODIUM 100 MCG PO TABS
100.0000 ug | ORAL_TABLET | Freq: Every day | ORAL | Status: DC
  Administered 2015-12-24: 100 ug via ORAL
  Filled 2015-12-23: qty 1

## 2015-12-23 MED ORDER — CLONAZEPAM 0.5 MG PO TABS
0.5000 mg | ORAL_TABLET | Freq: Two times a day (BID) | ORAL | Status: DC | PRN
  Administered 2015-12-23: 0.5 mg via ORAL
  Filled 2015-12-23: qty 1

## 2015-12-23 MED ORDER — LAMOTRIGINE 100 MG PO TABS
100.0000 mg | ORAL_TABLET | Freq: Every day | ORAL | Status: DC
  Administered 2015-12-24: 100 mg via ORAL
  Filled 2015-12-23: qty 1

## 2015-12-23 MED ORDER — RAMELTEON 8 MG PO TABS
8.0000 mg | ORAL_TABLET | Freq: Every day | ORAL | Status: DC
  Administered 2015-12-23: 8 mg via ORAL
  Filled 2015-12-23: qty 1

## 2015-12-23 MED ORDER — CARBAMAZEPINE ER 200 MG PO CP12
200.0000 mg | ORAL_CAPSULE | Freq: Two times a day (BID) | ORAL | Status: DC
  Filled 2015-12-23 (×3): qty 1

## 2015-12-23 MED ORDER — DULOXETINE HCL 60 MG PO CPEP
60.0000 mg | ORAL_CAPSULE | Freq: Every day | ORAL | Status: DC
  Administered 2015-12-24: 60 mg via ORAL
  Filled 2015-12-23: qty 1

## 2015-12-23 MED ORDER — POTASSIUM CHLORIDE CRYS ER 20 MEQ PO TBCR
60.0000 meq | EXTENDED_RELEASE_TABLET | Freq: Once | ORAL | Status: AC
  Administered 2015-12-23: 60 meq via ORAL
  Filled 2015-12-23: qty 3

## 2015-12-23 NOTE — Progress Notes (Signed)
Pt becoming increasingly more anxious, stating she needs her klonopin and is upset.  Pt answers questions appropriately and is A&Ox4, but anxious and slightly manic.  MD on call notified, orders given.  Will continue to monitor.

## 2015-12-23 NOTE — Progress Notes (Signed)
Subjective: Brittany [name] is alert and conversational this morning. Somewhat pressured speech, often tangential or perseverating. Overall odd affect and poor insight into her medical conditions and her medications. States that we need to call her lawyer to let him know that she is here. She again denies suicidal intention and stated that she took excess baclofen and tramadol to treat her trigeminal pain. No acute complaints today, but reports ongoing right-sided V2 trigeminal neuralgia pain and inquires about restarting home medications. Discussed some other treatment options for trigeminal neuralgia. Discussed the need to see our inpatient psychiatry first.   Objective: Vital signs in last 24 hours: Vitals:   12/22/15 2015 12/22/15 2315 12/23/15 0025 12/23/15 0427  BP: 107/64  117/76   Pulse: 88  87   Resp: (!) 22  (!) 25   Temp:  98.2 F (36.8 C)  98.7 F (37.1 C)  TempSrc:  Oral  Oral  SpO2: 98%  100%   Weight:      Height:        Intake/Output Summary (Last 24 hours) at 12/23/15 0719 Last data filed at 12/23/15 0600  Gross per 24 hour  Intake             4760 ml  Output                0 ml  Net             4760 ml    Physical Exam General appearance: Woman laying in bed with sitter at bedside, conversational and somewhat disheveled HENT: Normocephalic, atraumatic, exotropia of left eye, anisocoria and mydriasis (L>R, both significantly dilated 69m+), reactive to light, EOMI Cardiovascular: Regular rate and rhythm, no murmurs, rubs, gallops Respiratory/Chest: Clear to ausculation bilaterally, normal work of breathing Abdomen: Bowel sounds present, soft, non-tender, non-distended Skin: Warm, dry, intact, numerous linear scars along both forearms and legs Neuro: Alert and oriented, strength grossly intact Psych: Odd affect, tangential, pressures speech, often gives nonsensical circumferential answers and explanations   Labs / Imaging / Procedures: CBC Latest Ref Rng & Units  12/23/2015 12/22/2015  WBC 4.0 - 10.5 K/uL 12.1(H) 15.5(H)  Hemoglobin 12.0 - 15.0 g/dL 10.4(L) 12.0  Hematocrit 36.0 - 46.0 % 31.9(L) 35.2(L)  Platelets 150 - 400 K/uL 230 236   BMP Latest Ref Rng & Units 12/23/2015 12/22/2015 12/22/2015  Glucose 65 - 99 mg/dL 121(H) 86 94  BUN 6 - 20 mg/dL 9 14 15   Creatinine 0.44 - 1.00 mg/dL 0.99 1.02(H) 1.07(H)  Sodium 135 - 145 mmol/L 139 143 140  Potassium 3.5 - 5.1 mmol/L 3.1(L) 3.7 3.8  Chloride 101 - 111 mmol/L 107 111 107  CO2 22 - 32 mmol/L 22 24 19(L)  Calcium 8.9 - 10.3 mg/dL 8.1(L) 8.5(L) 9.4   No results found.   EKG: NSR. No widened QRS or prolonged QTc. No previous EKG for comparison.   Assessment/Plan: Brittany [name] a 50y.o. woman with PMH bipolar I disorder, trigeminal neuralgia, hypothyroidism, multiple drug overdoses in the past year who presents after polysubstance overdose at home.  Polysubstance overdose, baclofen, tramadol, and clonazepam. UDS + for cocaine. Acetaminophen and salicylate lvl negative. EKG unremarkable. Case discussed with poison control by ED. Admitted to SDU for close monitoring, some increased responsiveness s/p narcan. Awoke several hours after arrival. States unintentional, attempting to treat her trigeminal nerve pain with large amounts of baclofen and tramadol - Consulted inpatient psychiatry to assess, defer for restarting home medications - Will need to contact  Brittany [name] to discuss weaning off Tramadaol, Baclofen, Klonopin in setting of multiple overdoses and seeing neurology for her trigeminal neuralgia - Suicide precautions - AM CMP  Trigeminal neuralgia, severe nearly constant pain of right V2 distribution, worse with talking and tooth brushing, unable to characterize pain - Needs to follow up with neurology Dr. Tomi [name] to explore other treatment options - may be surgical or radiotherapy candidate given inadequate control and experiencing complications with medical management of this condition -  Continue home cymbalta, lamictal, gabapentin  Suspected aspiration pneumonia: Given history and distribution of consolidation, concerning for aspiration.s/p ceftriaxone in ED. Procalcitonin 0.37. Leukocytosis to 15.5 and temp of 95.31F, awakened and no cough or respiratory distress, afebrile - Dc unasyn - Follow up blood cultures  Elevated CK, trending down, likely secondary to immobility/overdose -Continue IV fluids, dc fluids when eating diet  Mild hepatitis: Alk phos 182, AST 127, ALT 107. Likely related to above. Alcohol level negative.  -Continue supportive care -Hep B and C screening  History of seizures,  -Seizure precautions -Check lamictal and carbapazempine levels - no lamictal detected, carbemazepine low  Hypothyroidism: Was on synthroid 157mg as of September. Will continue this as IV 540m.   Dispo: Anticipated discharge in approximately 1-2 day(s).   LOS: 1 day   AdAsencion Brittany [name] 12/23/2015, 7:19 AM Pager: [phone]

## 2015-12-23 NOTE — Progress Notes (Signed)
Patient has asked multiple times today about her home medications (topamax, gabapentin, klonopin) and is asking why she is not receiving them while in the hospital. I have informed her multiple times that the medical team is addressing it. Dr. Laural BenesJohnson notified again and stated he would discuss it with the team and then update the patient.  Brittany [name], Brittany Thune Eileen, RN.

## 2015-12-23 NOTE — Consult Note (Signed)
[place] Psychiatry Consult   Reason for Consult:  Intentional drug overdose Referring Physician:  Dr. Heber Rockland Patient Identification: Brittany [name] MRN:  [id number] Principal Diagnosis: Overdose Diagnosis:   Patient Active Problem List   Diagnosis Date Noted  . Overdose [T50.901A] 12/22/2015    Total Time spent with patient: 1 hour  Subjective:   Brittany [name] is a 50 y.o. female patient admitted with intentional drug overdose and AMS.  HPI:  Brittany [name] is a 50 years old female admitted to [facility] after intentional drug overdose and has altered mental status. Patient reported she has taken large quantity of baclofen to control her chronic pain and at the same time denied current symptoms of depression, anxiety, mania, auditory/visual hallucinations, delusions and paranoia. Patient was known to this provider from her previous multiple acute medical hospitalization secondary to frequent intentional drug overdose. Patient was seen by Dr. Bernita Raisin at alternate to behavioral Center for psychiatric outpatient medication management. Patient altered mental status as being slowly improving during my evaluation. Patient has no significant disturbance of orientation, concentration, language functions and attention.  Past Psychiatric History: Bipolar depression, substance abuse and multiple medical and psych admission for intentional drug overdose.   Risk to Self: Is patient at risk for suicide?: (S)  (took unknown medications tonight and patient unable to explain what happened tonight) Risk to Others:   Prior Inpatient Therapy:   Prior Outpatient Therapy:    Past Medical History: History reviewed. No pertinent past medical history. History reviewed. No pertinent surgical history. Family History: No family history on file. Family Psychiatric  History: Noncontributory Social History:  History  Alcohol Use No     History  Drug Use    Social History   Social  History  . Marital status: Single    Spouse name: N/A  . Number of children: N/A  . Years of education: N/A   Social History Main Topics  . Smoking status: Current Every Day Smoker  . Smokeless tobacco: Never Used  . Alcohol use No  . Drug use:   . Sexual activity: Not Asked   Other Topics Concern  . None   Social History Narrative  . None   Additional Social History:    Allergies:  No Known Allergies  Labs:  Results for orders placed or performed during the hospital encounter of 12/22/15 (from the past 48 hour(s))  CBG monitoring, ED     Status: Abnormal   Collection Time: 12/22/15  5:58 AM  Result Value Ref Range   Glucose-Capillary 108 (H) 65 - 99 mg/dL  Acetaminophen level     Status: Abnormal   Collection Time: 12/22/15  6:39 AM  Result Value Ref Range   Acetaminophen (Tylenol), Serum <10 (L) 10 - 30 ug/mL    Comment:        THERAPEUTIC CONCENTRATIONS VARY SIGNIFICANTLY. A RANGE OF 10-30 ug/mL MAY BE AN EFFECTIVE CONCENTRATION FOR MANY PATIENTS. HOWEVER, SOME ARE BEST TREATED AT CONCENTRATIONS OUTSIDE THIS RANGE. ACETAMINOPHEN CONCENTRATIONS >150 ug/mL AT 4 HOURS AFTER INGESTION AND >50 ug/mL AT 12 HOURS AFTER INGESTION ARE OFTEN ASSOCIATED WITH TOXIC REACTIONS.   Ethanol     Status: None   Collection Time: 12/22/15  6:39 AM  Result Value Ref Range   Alcohol, Ethyl (B) <5 <5 mg/dL    Comment:        LOWEST DETECTABLE LIMIT FOR SERUM ALCOHOL IS 5 mg/dL FOR MEDICAL PURPOSES ONLY   Salicylate level     Status: None  Collection Time: 12/22/15  6:39 AM  Result Value Ref Range   Salicylate Lvl <7.1 2.8 - 30.0 mg/dL  Comprehensive metabolic panel     Status: Abnormal   Collection Time: 12/22/15  6:40 AM  Result Value Ref Range   Sodium 140 135 - 145 mmol/L   Potassium 3.8 3.5 - 5.1 mmol/L   Chloride 107 101 - 111 mmol/L   CO2 19 (L) 22 - 32 mmol/L   Glucose, Bld 94 65 - 99 mg/dL   BUN 15 6 - 20 mg/dL   Creatinine, Ser 1.07 (H) 0.44 - 1.00 mg/dL    Calcium 9.4 8.9 - 10.3 mg/dL   Total Protein 7.9 6.5 - 8.1 g/dL   Albumin 3.7 3.5 - 5.0 g/dL   AST 127 (H) 15 - 41 U/L   ALT 107 (H) 14 - 54 U/L   Alkaline Phosphatase 182 (H) 38 - 126 U/L   Total Bilirubin 0.6 0.3 - 1.2 mg/dL   GFR calc non Af Amer 57 (L) >60 mL/min   GFR calc Af Amer >60 >60 mL/min    Comment: (NOTE) The eGFR has been calculated using the CKD EPI equation. This calculation has not been validated in all clinical situations. eGFR's persistently <60 mL/min signify possible Chronic Kidney Disease.    Anion gap 14 5 - 15  CBC WITH DIFFERENTIAL     Status: Abnormal   Collection Time: 12/22/15  6:40 AM  Result Value Ref Range   WBC 15.5 (H) 4.0 - 10.5 K/uL   RBC 3.90 3.87 - 5.11 MIL/uL   Hemoglobin 12.0 12.0 - 15.0 g/dL   HCT 35.2 (L) 36.0 - 46.0 %   MCV 90.3 78.0 - 100.0 fL   MCH 30.8 26.0 - 34.0 pg   MCHC 34.1 30.0 - 36.0 g/dL   RDW 13.4 11.5 - 15.5 %   Platelets 236 150 - 400 K/uL   Neutrophils Relative % 72 %   Lymphocytes Relative 13 %   Monocytes Relative 12 %   Eosinophils Relative 2 %   Basophils Relative 1 %   Neutro Abs 11.1 (H) 1.7 - 7.7 K/uL   Lymphs Abs 2.0 0.7 - 4.0 K/uL   Monocytes Absolute 1.9 (H) 0.1 - 1.0 K/uL   Eosinophils Absolute 0.3 0.0 - 0.7 K/uL   Basophils Absolute 0.2 (H) 0.0 - 0.1 K/uL   RBC Morphology POLYCHROMASIA PRESENT    WBC Morphology      MODERATE LEFT SHIFT (>5% METAS AND MYELOS,OCC PRO NOTED)  I-Stat Beta hCG blood, ED ([facility], [facility], [facility] only)     Status: None   Collection Time: 12/22/15  6:55 AM  Result Value Ref Range   I-stat hCG, quantitative <5.0 <5 mIU/mL   Comment 3            Comment:   GEST. AGE      CONC.  (mIU/mL)   <=1 WEEK        5 - 50     2 WEEKS       50 - 500     3 WEEKS       100 - 10,000     4 WEEKS     1,000 - 30,000        FEMALE AND NON-PREGNANT FEMALE:     LESS THAN 5 mIU/mL   Urine rapid drug screen (hosp performed)not at [facility]     Status: Abnormal   Collection Time: 12/22/15  7:08 AM  Result Value  Ref  Range   Opiates NONE DETECTED NONE DETECTED   Cocaine POSITIVE (A) NONE DETECTED   Benzodiazepines NONE DETECTED NONE DETECTED   Amphetamines NONE DETECTED NONE DETECTED   Tetrahydrocannabinol NONE DETECTED NONE DETECTED   Barbiturates NONE DETECTED NONE DETECTED    Comment:        DRUG SCREEN FOR MEDICAL PURPOSES ONLY.  IF CONFIRMATION IS NEEDED FOR ANY PURPOSE, NOTIFY LAB WITHIN 5 DAYS.        LOWEST DETECTABLE LIMITS FOR URINE DRUG SCREEN Drug Class       Cutoff (ng/mL) Amphetamine      1000 Barbiturate      200 Benzodiazepine   322 Tricyclics       025 Opiates          300 Cocaine          300 THC              50   Urinalysis, Routine w reflex microscopic (not at [facility])     Status: Abnormal   Collection Time: 12/22/15  7:08 AM  Result Value Ref Range   Color, Urine YELLOW YELLOW   APPearance CLEAR CLEAR   Specific Gravity, Urine 1.004 (L) 1.005 - 1.030   pH 6.5 5.0 - 8.0   Glucose, UA NEGATIVE NEGATIVE mg/dL   Hgb urine dipstick NEGATIVE NEGATIVE   Bilirubin Urine NEGATIVE NEGATIVE   Ketones, ur NEGATIVE NEGATIVE mg/dL   Protein, ur NEGATIVE NEGATIVE mg/dL   Nitrite NEGATIVE NEGATIVE   Leukocytes, UA NEGATIVE NEGATIVE    Comment: MICROSCOPIC NOT DONE ON URINES WITH NEGATIVE PROTEIN, BLOOD, LEUKOCYTES, NITRITE, OR GLUCOSE <1000 mg/dL.  I-Stat Arterial Blood Gas, ED  ([facility], [facility])     Status: Abnormal   Collection Time: 12/22/15  7:11 AM  Result Value Ref Range   pH, Arterial 7.384 7.350 - 7.450   pCO2 arterial 35.8 32.0 - 48.0 mmHg   pO2, Arterial 57.0 (L) 83.0 - 108.0 mmHg   Bicarbonate 21.4 20.0 - 28.0 mmol/L   TCO2 22 0 - 100 mmol/L   O2 Saturation 89.0 %   Acid-base deficit 3.0 (H) 0.0 - 2.0 mmol/L   Patient temperature HIDE    Collection site RADIAL, ALLEN'S TEST ACCEPTABLE    Drawn by RT    Sample type ARTERIAL   Blood culture (routine x 2)     Status: None (Preliminary result)   Collection Time: 12/22/15  8:00 AM  Result Value Ref Range   Specimen  Description BLOOD LEFT WRIST    Special Requests BOTTLES DRAWN AEROBIC AND ANAEROBIC  5CC    Culture NO GROWTH 1 DAY    Report Status PENDING   Blood culture (routine x 2)     Status: None (Preliminary result)   Collection Time: 12/22/15  8:02 AM  Result Value Ref Range   Specimen Description BLOOD LEFT HAND    Special Requests BOTTLES DRAWN AEROBIC AND ANAEROBIC  5CC    Culture NO GROWTH 1 DAY    Report Status PENDING   CK     Status: Abnormal   Collection Time: 12/22/15  8:04 AM  Result Value Ref Range   Total CK 999 (H) 38 - 234 U/L  Procalcitonin - Baseline     Status: None   Collection Time: 12/22/15  8:04 AM  Result Value Ref Range   Procalcitonin 0.37 ng/mL    Comment:        Interpretation: PCT (Procalcitonin) <= 0.5 ng/mL: Systemic infection (sepsis) is not likely.  Local bacterial infection is possible. (NOTE)         ICU PCT Algorithm               Non ICU PCT Algorithm    ----------------------------     ------------------------------         PCT < 0.25 ng/mL                 PCT < 0.1 ng/mL     Stopping of antibiotics            Stopping of antibiotics       strongly encouraged.               strongly encouraged.    ----------------------------     ------------------------------       PCT level decrease by               PCT < 0.25 ng/mL       >= 80% from peak PCT       OR PCT 0.25 - 0.5 ng/mL          Stopping of antibiotics                                             encouraged.     Stopping of antibiotics           encouraged.    ----------------------------     ------------------------------       PCT level decrease by              PCT >= 0.25 ng/mL       < 80% from peak PCT        AND PCT >= 0.5 ng/mL            Continuin g antibiotics                                              encouraged.       Continuing antibiotics            encouraged.    ----------------------------     ------------------------------     PCT level increase compared          PCT >  0.5 ng/mL         with peak PCT AND          PCT >= 0.5 ng/mL             Escalation of antibiotics                                          strongly encouraged.      Escalation of antibiotics        strongly encouraged.   MRSA PCR Screening     Status: Abnormal   Collection Time: 12/22/15  9:06 AM  Result Value Ref Range   MRSA by PCR POSITIVE (A) NEGATIVE    Comment:        The GeneXpert MRSA Assay (FDA approved for NASAL specimens only), is one component of a comprehensive MRSA colonization surveillance program. It is not intended to diagnose MRSA infection nor to guide  or monitor treatment for MRSA infections. RESULT CALLED TO, READ BACK BY AND VERIFIED WITH: Dellis Filbert RN 10:40 12/22/15 ([email or web address])   Comprehensive metabolic panel     Status: Abnormal   Collection Time: 12/22/15  3:04 PM  Result Value Ref Range   Sodium 143 135 - 145 mmol/L   Potassium 3.7 3.5 - 5.1 mmol/L   Chloride 111 101 - 111 mmol/L   CO2 24 22 - 32 mmol/L   Glucose, Bld 86 65 - 99 mg/dL   BUN 14 6 - 20 mg/dL   Creatinine, Ser 1.02 (H) 0.44 - 1.00 mg/dL   Calcium 8.5 (L) 8.9 - 10.3 mg/dL   Total Protein 6.5 6.5 - 8.1 g/dL   Albumin 2.9 (L) 3.5 - 5.0 g/dL   AST 68 (H) 15 - 41 U/L   ALT 79 (H) 14 - 54 U/L   Alkaline Phosphatase 144 (H) 38 - 126 U/L   Total Bilirubin 0.5 0.3 - 1.2 mg/dL   GFR calc non Af Amer >60 >60 mL/min   GFR calc Af Amer >60 >60 mL/min    Comment: (NOTE) The eGFR has been calculated using the CKD EPI equation. This calculation has not been validated in all clinical situations. eGFR's persistently <60 mL/min signify possible Chronic Kidney Disease.    Anion gap 8 5 - 15  HIV antibody     Status: None   Collection Time: 12/22/15  3:04 PM  Result Value Ref Range   HIV Screen 4th Generation wRfx Non Reactive Non Reactive    Comment: (NOTE) Performed At: [facility] [address] [place], [place] [id number] Lindon Romp MD [id number]   Protime-INR      Status: None   Collection Time: 12/22/15  3:04 PM  Result Value Ref Range   Prothrombin Time 13.8 11.4 - 15.2 seconds   INR 1.05   APTT     Status: Abnormal   Collection Time: 12/22/15  3:04 PM  Result Value Ref Range   aPTT 42 (H) 24 - 36 seconds    Comment:        IF BASELINE aPTT IS ELEVATED, SUGGEST PATIENT RISK ASSESSMENT BE USED TO DETERMINE APPROPRIATE ANTICOAGULANT THERAPY.   Hepatitis C antibody     Status: Abnormal   Collection Time: 12/22/15  3:04 PM  Result Value Ref Range   HCV Ab 2.3 (H) 0.0 - 0.9 s/co ratio    Comment: (NOTE)                                  Negative:     < 0.8                             Indeterminate: 0.8 - 0.9                                  Positive:     > 0.9 The CDC recommends that a positive HCV antibody result be followed up with a HCV Nucleic Acid Amplification test ([id number]). Performed At: [facility] [address] [place], [place] [id number] Lindon Romp MD [id number]   Hepatitis B core antibody, total     Status: None   Collection Time: 12/22/15  3:04 PM  Result Value Ref Range   Hep B Core Total Ab Negative  Negative    Comment: (NOTE) Performed At: [facility] [name], [place] [id number] Lindon Romp MD [id number]   Hepatitis B surface antigen     Status: None   Collection Time: 12/22/15  3:04 PM  Result Value Ref Range   Hepatitis B Surface Ag Negative Negative    Comment: (NOTE) Performed At: [facility] [place], [place] [id number] Lindon Romp MD [id number]   Hepatitis B surface antibody     Status: Abnormal   Collection Time: 12/22/15  3:04 PM  Result Value Ref Range   Hepatitis B-Post <3.1 (L) Immunity>9.9 mIU/mL    Comment: (NOTE)  Status of Immunity                     Anti-HBs Level  ------------------                     -------------- Inconsistent with Immunity                   0.0 - 9.9 Consistent with Immunity                           >9.9 Performed At: [facility] [place], [place] [id number] Lindon Romp MD [id number]   Carbamazepine level, total     Status: Abnormal   Collection Time: 12/22/15  3:04 PM  Result Value Ref Range   Carbamazepine Lvl <2.0 (L) 4.0 - 12.0 ug/mL    Comment: REPEATED TO VERIFY  Comprehensive metabolic panel     Status: Abnormal   Collection Time: 12/23/15  4:47 AM  Result Value Ref Range   Sodium 139 135 - 145 mmol/L   Potassium 3.1 (L) 3.5 - 5.1 mmol/L   Chloride 107 101 - 111 mmol/L   CO2 22 22 - 32 mmol/L   Glucose, Bld 121 (H) 65 - 99 mg/dL   BUN 9 6 - 20 mg/dL   Creatinine, Ser 0.99 0.44 - 1.00 mg/dL   Calcium 8.1 (L) 8.9 - 10.3 mg/dL   Total Protein 6.2 (L) 6.5 - 8.1 g/dL   Albumin 2.6 (L) 3.5 - 5.0 g/dL   AST 51 (H) 15 - 41 U/L   ALT 64 (H) 14 - 54 U/L   Alkaline Phosphatase 124 38 - 126 U/L   Total Bilirubin 0.4 0.3 - 1.2 mg/dL   GFR calc non Af Amer >60 >60 mL/min   GFR calc Af Amer >60 >60 mL/min    Comment: (NOTE) The eGFR has been calculated using the CKD EPI equation. This calculation has not been validated in all clinical situations. eGFR's persistently <60 mL/min signify possible Chronic Kidney Disease.    Anion gap 10 5 - 15  CBC     Status: Abnormal   Collection Time: 12/23/15  4:47 AM  Result Value Ref Range   WBC 12.1 (H) 4.0 - 10.5 K/uL   RBC 3.49 (L) 3.87 - 5.11 MIL/uL   Hemoglobin 10.4 (L) 12.0 - 15.0 g/dL   HCT 31.9 (L) 36.0 - 46.0 %   MCV 91.4 78.0 - 100.0 fL   MCH 29.8 26.0 - 34.0 pg   MCHC 32.6 30.0 - 36.0 g/dL   RDW 13.8 11.5 - 15.5 %   Platelets 230 150 - 400 K/uL  CK     Status: Abnormal   Collection Time: 12/23/15  4:47 AM  Result Value Ref  Range   Total CK 415 (H) 38 - 234 U/L    Current Facility-Administered Medications  Medication Dose Route Frequency Provider Last Rate Last Dose  . 0.9 %  sodium chloride infusion   Intravenous Continuous Milagros Loll, MD 150 mL/hr at 12/23/15 1333    .  acetaminophen (TYLENOL) tablet 650 mg  650 mg Oral Q6H PRN Milagros Loll, MD       Or  . acetaminophen (TYLENOL) suppository 650 mg  650 mg Rectal Q6H PRN Milagros Loll, MD      . carbamazepine (EQUETRO) 12 hr capsule 200 mg  200 mg Oral BID Ambrose Finland, MD      . Chlorhexidine Gluconate Cloth 2 % PADS 6 each  6 each Topical Q0600 Oval Linsey, MD   6 each at 12/23/15 0600  . clonazePAM (KLONOPIN) tablet 0.5 mg  0.5 mg Oral BID PRN Ambrose Finland, MD      . Derrill Memo ON 12/24/2015] DULoxetine (CYMBALTA) DR capsule 60 mg  60 mg Oral Daily Ambrose Finland, MD      . enoxaparin (LOVENOX) injection 40 mg  40 mg Subcutaneous Q24H Milagros Loll, MD   40 mg at 12/23/15 1013  . gabapentin (NEURONTIN) capsule 300 mg  300 mg Oral TID Ambrose Finland, MD      . lamoTRIgine (LAMICTAL) tablet 100 mg  100 mg Oral Daily Ambrose Finland, MD      . Derrill Memo ON 12/24/2015] levothyroxine (SYNTHROID, LEVOTHROID) tablet 100 mcg  100 mcg Oral QAC breakfast Renee J Ackley, RPH      . mupirocin ointment (BACTROBAN) 2 % 1 application  1 application Nasal BID Oval Linsey, MD   1 application at 49/75/30 1006  . naloxone (NARCAN) injection 0.4 mg  0.4 mg Intravenous PRN Milagros Loll, MD      . ramelteon (ROZEREM) tablet 8 mg  8 mg Oral QHS Bethany Molt, DO      . senna-docusate (Senokot-S) tablet 1 tablet  1 tablet Oral QHS PRN Milagros Loll, MD      . sodium chloride flush (NS) 0.9 % injection 3 mL  3 mL Intravenous Q12H Milagros Loll, MD   3 mL at 12/23/15 1006    Musculoskeletal: Strength & Muscle Tone: within normal limits Gait & Station: normal Patient leans: N/A  Psychiatric Specialty Exam: Physical Exam as per history and physical   ROS denied nausea, vomiting, abdomen pain, shortness of breath adjustment.  No Fever-chills, No Headache, No changes with Vision or hearing, reports vertigo No problems swallowing food or Liquids, No Chest pain,  Cough or Shortness of Breath, No Abdominal pain, No Nausea or Vommitting, Bowel movements are regular, No Blood in stool or Urine, No dysuria, No new skin rashes or bruises, No new joints pains-aches,  No new weakness, tingling, numbness in any extremity, No recent weight gain or loss, No polyuria, polydypsia or polyphagia,   A full 10 point Review of Systems was done, except as stated above, all other Review of Systems were negative.  Blood pressure (!) 149/96, pulse 91, temperature 98.8 F (37.1 C), temperature source Oral, resp. rate (!) 27, height 5' 6"  (1.676 m), weight 93.7 kg (206 lb 9.1 oz), SpO2 100 %.Body mass index is 33.34 kg/m.  General Appearance: Casual  Eye Contact:  Good  Speech:  Clear and Coherent  Volume:  Decreased  Mood:  Euthymic  Affect:  Appropriate and Congruent  Thought Process:  Coherent and Goal Directed  Orientation:  Full (Time, Place, and Person)  Thought Content:  WDL  Suicidal Thoughts:  No  Homicidal Thoughts:  No  Memory:  Immediate;   Good Recent;   Fair Remote;   Fair  Judgement:  Impaired  Insight:  Fair  Psychomotor Activity:  Decreased  Concentration:  Concentration: Fair and Attention Span: Fair  Recall:  Good  Fund of Knowledge:  Good  Language:  Good  Akathisia:  Negative  Handed:  Right  AIMS (if indicated):     Assets:  Communication Skills Desire for Improvement Financial Resources/Insurance Housing Leisure Time Resilience Social Support Transportation  ADL's:  Intact  Cognition:  WNL  Sleep:        Treatment Plan Summary: 50 years old female who is married, living with her husband admitted status post intentional drug overdose which is baclofen reportedly she is trying to control her chronic pain from trigeminal neuralgia. Patient denied symptoms of depression, mania, anxiety, suicidal/homicidal ideation, intention or plans.  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  Appreciate psychiatric consultation and we sign off as of today Please contact [phone] or [phone] if needs further assistance  Disposition: Recently be referred to the outpatient psychiatric services for medication management and counseling services and medically stable. No evidence of imminent risk to self or others at present.   Patient does not meet criteria for psychiatric inpatient admission. Supportive therapy provided about ongoing stressors.  Ambrose Finland, MD 12/23/2015 3:59 PM

## 2015-12-23 NOTE — Progress Notes (Signed)
During 0800 neuro check it was noted that patient's pupils appear slightly unequal, but remain very dilated. Both pupils reacted briskly to light. Dr. Laural BenesJohnson with [facility] notified. Will continue to monitor with q4h neuro checks.   Leanna BattlesEckelmann, Saqib Cazarez Eileen, RN.

## 2015-12-23 NOTE — Care Management Note (Signed)
Case Management Note  Patient Details  Name: Brittany [name] MRN: [id number] Date of Birth: 15-Oct-1965  Subjective/Objective:     Patient is from home with a friend, presents with AMS from OD off baclofen and tramadol.  She has a Comptrollersitter in the room, unrine positive for psa, psych has been consulted. Patient states she  Will need a bus pass at discharge but she would rather have a cab, also she states she has a court date tomorrow.  NCM made referral to CSW for court date and transportation issue.  Patient is interested in pcs services, she has medicaid and she has co pay of 3.00 for her medications.  NCM will give patient the form for pcs services to take to her pcp.                 Action/Plan:   Expected Discharge Date:                  Expected Discharge Plan:  Home/Self Care  In-House Referral:     Discharge planning Services  CM Consult  Post Acute Care Choice:    Choice offered to:     DME Arranged:    DME Agency:     HH Arranged:    HH Agency:     Status of Service:  In process, will continue to follow  If discussed at Long Length of Stay Meetings, dates discussed:    Additional Comments:  Leone Havenaylor, Kamylah Manzo Clinton, RN 12/23/2015, 12:12 PM

## 2015-12-23 NOTE — Clinical Social Work Note (Addendum)
Per RNCM, patient in need of a letter to her attorney, letting him know that she will not be able to make her court date at 8:30 am tomorrow morning (11/10). CSW spoke with patient. Patient gave verbal permission to call her attorney to discuss and get fax number to fax over letter. CSW called attorney and left a voicemail.  Charlynn CourtSarah Lasheena Frieze, CSW [phone]  3:14 pm CSW had patient sign a consent form to fax letter to patient's attorney. CSW called attorney's office and got fax number. Consent form and letter faxed.  Charlynn CourtSarah Tien Aispuro, CSW [phone]

## 2015-12-24 DIAGNOSIS — G92 Toxic encephalopathy: Secondary | ICD-10-CM

## 2015-12-24 DIAGNOSIS — R768 Other specified abnormal immunological findings in serum: Secondary | ICD-10-CM | POA: Diagnosis present

## 2015-12-24 LAB — COMPREHENSIVE METABOLIC PANEL
ALT: 50 U/L (ref 14–54)
AST: 38 U/L (ref 15–41)
Albumin: 2.8 g/dL — ABNORMAL LOW (ref 3.5–5.0)
Alkaline Phosphatase: 108 U/L (ref 38–126)
Anion gap: 7 (ref 5–15)
BUN: 7 mg/dL (ref 6–20)
CHLORIDE: 113 mmol/L — AB (ref 101–111)
CO2: 20 mmol/L — ABNORMAL LOW (ref 22–32)
Calcium: 8.5 mg/dL — ABNORMAL LOW (ref 8.9–10.3)
Creatinine, Ser: 0.89 mg/dL (ref 0.44–1.00)
Glucose, Bld: 89 mg/dL (ref 65–99)
POTASSIUM: 3.7 mmol/L (ref 3.5–5.1)
Sodium: 140 mmol/L (ref 135–145)
Total Bilirubin: 0.9 mg/dL (ref 0.3–1.2)
Total Protein: 6.4 g/dL — ABNORMAL LOW (ref 6.5–8.1)

## 2015-12-24 LAB — CBC
HCT: 32 % — ABNORMAL LOW (ref 36.0–46.0)
Hemoglobin: 10.7 g/dL — ABNORMAL LOW (ref 12.0–15.0)
MCH: 30.2 pg (ref 26.0–34.0)
MCHC: 33.4 g/dL (ref 30.0–36.0)
MCV: 90.4 fL (ref 78.0–100.0)
PLATELETS: 255 10*3/uL (ref 150–400)
RBC: 3.54 MIL/uL — ABNORMAL LOW (ref 3.87–5.11)
RDW: 13.4 % (ref 11.5–15.5)
WBC: 11.3 10*3/uL — AB (ref 4.0–10.5)

## 2015-12-24 LAB — PROCALCITONIN: Procalcitonin: 0.2 ng/mL

## 2015-12-24 MED ORDER — DULOXETINE HCL 60 MG PO CPEP: 60.0000 mg | ORAL_CAPSULE | Freq: Every day | ORAL | 0 refills | Status: DC

## 2015-12-24 MED ORDER — CLONAZEPAM 1 MG PO TABS: 0.5000 mg | ORAL_TABLET | Freq: Two times a day (BID) | ORAL | 0 refills | Status: DC | PRN

## 2015-12-24 MED ORDER — TRAMADOL HCL 50 MG PO TABS
50.0000 mg | ORAL_TABLET | Freq: Once | ORAL | Status: AC
  Administered 2015-12-24: 50 mg via ORAL
  Filled 2015-12-24: qty 1

## 2015-12-24 MED ORDER — CARBAMAZEPINE ER 200 MG PO TB12: 200.0000 mg | ORAL_TABLET | Freq: Two times a day (BID) | ORAL | 0 refills | Status: DC

## 2015-12-24 MED ORDER — LAMOTRIGINE 100 MG PO TABS: 100.0000 mg | ORAL_TABLET | Freq: Every day | ORAL | 0 refills | Status: DC

## 2015-12-24 MED ORDER — CARBAMAZEPINE ER 200 MG PO TB12
200.0000 mg | ORAL_TABLET | Freq: Two times a day (BID) | ORAL | Status: DC
  Administered 2015-12-24: 200 mg via ORAL
  Filled 2015-12-24: qty 1

## 2015-12-24 MED ORDER — NALOXONE HCL 4 MG/0.1ML NA LIQD
NASAL | 0 refills | Status: DC
Start: 2015-12-24 — End: 2016-04-14

## 2015-12-24 NOTE — Progress Notes (Signed)
Pt c/o of "20"  Out of 10 pain called me into room and said she couldn't see, I then examined pt and her pupils reacted and she tracked me around the room. She then said she could see but was in horrible pain paged Dr. Vincente LibertyMolt and she ordered a 1 time dose of ultram will continue to monitor

## 2015-12-24 NOTE — Progress Notes (Signed)
Subjective: Feeling well and ready to go home today. Seen by [facility]/psych yesterday. Resumed carbamazepine, cymbalta, klonopin, gabapentin, lamictal yesterday per psych. Still experiencing 20/10 pain overnight and received 1x ultram to effect. States understanding that baclofen is not be used anymore. Reports that her court case issue has been resolved. Discussed with inpatient psychiatry and cleared for discharge today  Objective: Vital signs in last 24 hours: Vitals:   12/23/15 1455 12/23/15 2027 12/23/15 2356 12/24/15 0313  BP: (!) 149/96 121/75 124/68 119/72  Pulse: 91 75 72 74  Resp: (!) 27 (!) 30 (!) 27 (!) 24  Temp: 98.8 F (37.1 C) 98.1 F (36.7 C) 98.5 F (36.9 C) 98.7 F (37.1 C)  TempSrc: Oral Oral Oral Oral  SpO2: 100% 100% 99% 95%  Weight:      Height:        Intake/Output Summary (Last 24 hours) at 12/24/15 9191 Last data filed at 12/24/15 0600  Gross per 24 hour  Intake             4320 ml  Output                0 ml  Net             4320 ml    Physical Exam General appearance: Woman laying in bed with sitter at bedside, conversational and somewhat disheveled HENT: Normocephalic, atraumatic, exotropia of left eye, anisocoria and mydriasis (L>R, both significantly dilated 52m+), reactive to light, EOMI Cardiovascular: Regular rate and rhythm, no murmurs, rubs, gallops Respiratory/Chest: Clear to ausculation bilaterally, normal work of breathing Abdomen: Bowel sounds present, soft, non-tender, non-distended Skin: Warm, dry, intact, numerous linear scars along both forearms and legs Neuro: Alert and oriented, strength grossly intact Psych: Odd affect, tangential, pressures speech, often gives nonsensical circumferential answers and explanations   Labs / Imaging / Procedures: CBC Latest Ref Rng & Units 12/24/2015 12/23/2015 12/22/2015  WBC 4.0 - 10.5 K/uL 11.3(H) 12.1(H) 15.5(H)  Hemoglobin 12.0 - 15.0 g/dL 10.7(L) 10.4(L) 12.0  Hematocrit 36.0 - 46.0 % 32.0(L)  31.9(L) 35.2(L)  Platelets 150 - 400 K/uL 255 230 236   BMP Latest Ref Rng & Units 12/24/2015 12/23/2015 12/22/2015  Glucose 65 - 99 mg/dL 89 121(H) 86  BUN 6 - 20 mg/dL 7 9 14   Creatinine 0.44 - 1.00 mg/dL 0.89 0.99 1.02(H)  Sodium 135 - 145 mmol/L 140 139 143  Potassium 3.5 - 5.1 mmol/L 3.7 3.1(L) 3.7  Chloride 101 - 111 mmol/L 113(H) 107 111  CO2 22 - 32 mmol/L 20(L) 22 24  Calcium 8.9 - 10.3 mg/dL 8.5(L) 8.1(L) 8.5(L)   No results found.   EKG: NSR. No widened QRS or prolonged QTc. No previous EKG for comparison.   Assessment/Plan: KAleea Hendryis a 50y.o. woman with PMH bipolar I disorder, trigeminal neuralgia, hypothyroidism, multiple drug overdoses in the past year who presents after polysubstance overdose at home.  Polysubstance overdose, baclofen, tramadol, and clonazepam. UDS + for cocaine. Acetaminophen and salicylate lvl negative. EKG unremarkable. Case discussed with poison control by ED. Admitted to SDU for close monitoring, some increased responsiveness s/p narcan. Awoke several hours after arrival. States unintentional, attempting to treat her trigeminal nerve pain with large amounts of baclofen and tramadol - Consulted inpatient psychiatry to assess, started home meds, cleared and not deemed suicidal - Added Cymbalta, Tegretol, Lamictal - Baclofen and Tramadol should be discontinued - Set up appointment with Dr. HRosine Door(psych) and has appointment next week with Dr. JTomi Likens  of neurology  Trigeminal neuralgia, severe nearly constant pain of right V2 distribution, worse with talking and tooth brushing, unable to characterize pain - Needs to follow up with neurology Dr. Tomi Likens to explore other treatment options - may be surgical or radiotherapy candidate given inadequate control and experiencing complications with medical management of this condition - Continue home cymbalta, lamictal, gabapentin  Suspected aspiration pneumonia: Given history and distribution of  consolidation, concerning for aspiration.s/p ceftriaxone in ED. Procalcitonin 0.37. Leukocytosis to 15.5 and temp of 95.45F, awakened and no cough or respiratory distress, afebrile - Dc unasyn - Follow up blood cultures  Elevated CK, trending down, likely secondary to immobility/overdose -Continue IV fluids, dc fluids when eating diet  Mild hepatitis: Alk phos 182, AST 127, ALT 107. Likely related to above. Alcohol level negative.  -Continue supportive care -Hep B and C screening  History of seizures,  -Seizure precautions -Check lamictal and carbapazempine levels - no lamictal detected, carbemazepine low  Hypothyroidism: Was on synthroid 159mg as of September. Will continue this as IV 579m.   Dispo: Anticipated discharge today..   LOS: 2 days   AdAsencion PartridgeMD 12/24/2015, 7:23 AM Pager: [phone]

## 2015-12-24 NOTE — Discharge Summary (Signed)
Name: [name] MRN: [id number] DOB: 1965/08/16 50 y.o. PCP: Evelene CroonMeindert Niemeyer, MD  Date of Admission: 12/22/2015  5:57 AM Date of Discharge: 12/24/2015 Attending Physician: Gust RungErik C Hoffman, DO  Discharge Diagnosis: 1. Prescription drug overdose  Principal Problem:   Overdose Active Problems:   Bipolar 1 disorder (HCC)   Trigeminal neuralgia   Positive hepatitis C antibody test  Discharge Medications:   Medication List    STOP taking these medications   traMADol 50 MG tablet Commonly known as:  ULTRAM     TAKE these medications   carbamazepine 200 MG 12 hr tablet Commonly known as:  TEGRETOL XR Take 1 tablet (200 mg total) by mouth 2 (two) times daily.   clonazePAM 1 MG tablet Commonly known as:  KLONOPIN Take 0.5 tablets (0.5 mg total) by mouth 2 (two) times daily as needed for anxiety. What changed:  how much to take  when to take this   DULoxetine 60 MG capsule Commonly known as:  CYMBALTA Take 1 capsule (60 mg total) by mouth daily.   gabapentin 400 MG capsule Commonly known as:  NEURONTIN Take 400 mg by mouth 3 (three) times daily.   lamoTRIgine 100 MG tablet Commonly known as:  LAMICTAL Take 1 tablet (100 mg total) by mouth daily.   levothyroxine 100 MCG tablet Commonly known as:  SYNTHROID, LEVOTHROID Take 100 mcg by mouth daily before breakfast.   naloxone HCl 4 MG/0.1ML Liqd For suspected opioid overdose, administer one spray into one nostril. Use a new nasal spray for subsequent doses and administer into alternating nostrils. May repeat every 2-3 minutes.   topiramate 25 MG tablet Commonly known as:  TOPAMAX Take 25 mg by mouth 3 (three) times daily. What changed:  Another medication with the same name was removed. Continue taking this medication, and follow the directions you see here.       Disposition and follow-up:   Ms.Brittany [name] [facility] St[facility].  At the hospital follow  up visit please address:  1.  Prescription drug overdoses - several admissions this year for drug overdose, this admission for inintentional overdose of Baclofen and Tramadol and should not be taking these medications anymore - assess medication use and patterns of abuse  Trigeminal neuralgia - right V2 distribution, seems to be refractory to numerous medication, patient overdosed on Baclofen and Tramadol trying to treat the severe pain from this condition - assess pain control and titrate medication, patient may require referal for advanced intervention such as stereotactic radiation treatment  Suspected aspiration pneumonia - presented with AMS and leukocytosis, CXR was concerning for aspiration event - when patient awoke had no cough or dyspnea, remained afebrile, so this is unlikely - assess for any symptoms of pneumonia  Psychiatric conditions - ensure patient adhering to regular follow up with her provider (Dr. Omelia BlackwaterHeaden)  Drug use - UDS positive for cocaine during this admission, assess and encourage cessaton  2.  Labs / imaging needed at time of follow-up: UDS  3.  Pending labs/ test needing follow-up: None  Follow-up Appointments: Follow-up Information    Cira Servantdam Robert Jaffe, DO Follow up on 12/28/2015.   Specialty:  Neurology Why:  At 11:15 AM, please arrive 15 minutes early to check in. Contact information: [address] [place] [id number] [phone]        Dolores FrameHEADEN,KENNETH, MD. Go on 01/03/2016.   Why:  For therapy visit with Suzan GaribaldiJasmine [place]om[place]  MUST GO TO THIS TO SEE DR. HEADEN later this week Contact information: [address] [place] [address] [phone]           Hospital Course by problem list: Principal Problem:   Overdose Active Problems:   Bipolar 1 disorder (HCC)   Trigeminal neuralgia   Positive hepatitis C antibody test   1. Prescription drug overdose Brittany [name] is a 50 y/o woman with PMH Bipolar I  disorder, trigeminal neuralgia, hypothyroidism who presented on 11/8 after an overdose.  She was found altered by her husband who called EMS. The suspicion was that she had taken large amounts of baclofen, and possibly tramadol and clonazepam. She was monitored overnight in the stepdown unit with supportive care in conjunction with poison control. She awoke later in the day on 11/8 and reported that the overdose was not intentional but that she was trying to treat her trigeminal nerve pain and took too much Baclofen and Tramadol. Psychiatry inpatient consult saw her on 11/9, deemed her not actively suicidal, and restarted most of her home psychiatric medications for bipolar disorder and trigeminal neuralgia. We discontinued her Baclofen and Tramadol. She was also very preoccupied about a court appearance for disorderly conduct that was scheduled for one of the days she was hospitalized and asked for a note confirming her presence in the hospital. She remained stable on her medications and was discharged on 11/10, given rx for Narcan, and arranged for close neurology and psychiatry follow up.   2. Trigeminal neuralgia - endorsing ongoing severe intermittent pain in right V2 distribution, restarted home Cymbalta, Lamictal, Gabapentin, Tegretol on 11/9 but continued to require intermittent prn Tramadol for pain control. Medical management of this complicated by numerous medication "intolerances." She is scheduled to follow up with neurology (Dr. Everlena Cooper) and is interested in pursuing alternative treatments.  3. Suspected aspiration pneumonia - given recent overdose, consolidation on CXR, and leukocytosis she was started on IV Unasyn. This was discontinued by 11/9 as she remained afebrile with no cough or dyspnea. Blood cultures with no growth.  Discharge Vitals:   BP 122/76 (BP Location: Left Arm)   Pulse 82   Temp 97.9 F (36.6 C) (Oral)   Resp (!) 25   Ht 5\' 6"  (1.676 m)   Wt 93.7 kg (206 lb 9.1 oz)   LMP   (LMP Unknown)   SpO2 96%   BMI 33.34 kg/m   Pertinent Labs, Studies, and Procedures:  CBC Latest Ref Rng & Units 12/24/2015 12/23/2015 12/22/2015  WBC 4.0 - 10.5 K/uL 11.3(H) 12.1(H) 15.5(H)  Hemoglobin 12.0 - 15.0 g/dL 10.7(L) 10.4(L) 12.0  Hematocrit 36.0 - 46.0 % 32.0(L) 31.9(L) 35.2(L)  Platelets 150 - 400 K/uL 255 230 236   CMP Latest Ref Rng & Units 12/24/2015 12/23/2015 12/22/2015  Glucose 65 - 99 mg/dL 89 914(N) 86  BUN 6 - 20 mg/dL 7 9 14   Creatinine 0.44 - 1.00 mg/dL 8.29 5.62 1.30(Q)  Sodium 135 - 145 mmol/L 140 139 143  Potassium 3.5 - 5.1 mmol/L 3.7 3.1(L) 3.7  Chloride 101 - 111 mmol/L 113(H) 107 111  CO2 22 - 32 mmol/L 20(L) 22 24  Calcium 8.9 - 10.3 mg/dL 6.5(H) 8.1(L) 8.5(L)  Total Protein 6.5 - 8.1 g/dL 6.4(L) 6.2(L) 6.5  Total Bilirubin 0.3 - 1.2 mg/dL 0.9 0.4 0.5  Alkaline Phos 38 - 126 U/L 108 124 144(H)  AST 15 - 41 U/L 38 51(H) 68(H)  ALT 14 - 54 U/L 50 64(H) 79(H)   Drugs  of Abuse     Component Value Date/Time   LABOPIA NONE DETECTED 12/22/2015 0708   COCAINSCRNUR POSITIVE (A) 12/22/2015 0708   COCAINSCRNUR Negative 04/20/2015 1829   COCAINSCRNUR NEGATIVE 08/20/2011 2347   LABBENZ NONE DETECTED 12/22/2015 0708   AMPHETMU NONE DETECTED 12/22/2015 0708   THCU NONE DETECTED 12/22/2015 0708   LABBARB NONE DETECTED 12/22/2015 0708   Dg Chest Port 1 View  Result Date: 12/22/2015 CLINICAL DATA:  Overdose.  Altered mental status. EXAM: PORTABLE CHEST 1 VIEW COMPARISON:  None. FINDINGS: Normal heart size and pulmonary vascularity. Airspace infiltration in the right mid and lower lung zone. This could represent pneumonia or aspiration. Left lung is clear. No blunting of costophrenic angles. No pneumothorax. Mediastinal contours appear intact. IMPRESSION: Airspace consolidation in the right mid and lower lungs. This could represent pneumonia or aspiration. Electronically Signed   By: Burman NievesWilliam  Stevens M.D.   On: 12/22/2015 06:33   Discharge Instructions: Discharge  Instructions    Call MD for:  difficulty breathing, headache or visual disturbances    Complete by:  As directed    Call MD for:  extreme fatigue    Complete by:  As directed    Call MD for:  persistant dizziness or light-headedness    Complete by:  As directed    Call MD for:  persistant nausea and vomiting    Complete by:  As directed    Call MD for:  severe uncontrolled pain    Complete by:  As directed    Diet - low sodium heart healthy    Complete by:  As directed    Discharge instructions    Complete by:  As directed    Please stop taking baclofen, as this is a dangerous medication that is not a good treatment for trigeminal neuralgia. Please stop taking Tramadol, as this medication is also dangerous. Follow up and work with your psychiatrist and neurologist to work on tapering your klonopin, and also finding the right medications and doses to treat your trigeminal pain.   Per recommendation of our inpatient psychiatrist: We are sending you home with Tegretol XR twice daily, Cymbalta 60mg  daily, Lamictal 100mg  daily. You can continue to take your home dose of Neurontin (400 mg three times daily) and Topiramate (25 mg three times daily).  We have also provided a prescription for Nasal Narcan - an opiate reversal drug that you husband/family can administer if they find you unresponsive and suspect opiate overdose (in addition to calling 911 of course).   Increase activity slowly    Complete by:  As directed       Signed: Althia FortsAdam Jermon Chalfant, MD 01/02/2016, 2:37 PM   Pager: [phone]

## 2015-12-24 NOTE — Clinical Social Work Note (Signed)
CSW provided two bus passes to RN. She will give to patient when ready to leave.  CSW signing off. Patient discharging today.  Charlynn CourtSarah Arianna Haydon, CSW [phone]

## 2015-12-25 ENCOUNTER — Encounter (HOSPITAL_COMMUNITY): Payer: Self-pay | Admitting: Emergency Medicine

## 2015-12-27 LAB — CULTURE, BLOOD (ROUTINE X 2)
CULTURE: NO GROWTH
Culture: NO GROWTH

## 2015-12-28 ENCOUNTER — Ambulatory Visit: Payer: Self-pay | Admitting: Neurology

## 2015-12-28 ENCOUNTER — Encounter: Payer: Self-pay | Admitting: Neurology

## 2016-01-03 ENCOUNTER — Encounter: Payer: Self-pay | Admitting: Neurology

## 2016-01-12 ENCOUNTER — Telehealth: Payer: Self-pay | Admitting: Neurology

## 2016-01-12 NOTE — Telephone Encounter (Signed)
Patient dismissed from [facility] by Shon MilletAdam Jaffe DO , effective December 31, 2015. Dismissal letter sent out by certified / registered mail.  DAJ

## 2016-01-19 NOTE — Telephone Encounter (Signed)
Received signed domestic return receipt verifying delivery of certified letter on January 15, 2016. Article number [id number] [id number]

## 2016-03-07 ENCOUNTER — Ambulatory Visit: Payer: Medicaid Other | Admitting: Neurology

## 2016-04-14 ENCOUNTER — Emergency Department (HOSPITAL_COMMUNITY)
Admission: EM | Admit: 2016-04-14 | Discharge: 2016-04-14 | Disposition: A | Discharge status: Home / Self Care | Payer: Medicaid Other | Attending: Emergency Medicine | Admitting: Emergency Medicine

## 2016-04-14 ENCOUNTER — Encounter (HOSPITAL_COMMUNITY): Payer: Self-pay | Admitting: Emergency Medicine

## 2016-04-14 DIAGNOSIS — G8929 Other chronic pain: Secondary | ICD-10-CM | POA: Diagnosis not present

## 2016-04-14 DIAGNOSIS — Y999 Unspecified external cause status: Secondary | ICD-10-CM | POA: Diagnosis not present

## 2016-04-14 DIAGNOSIS — S8991XA Unspecified injury of right lower leg, initial encounter: Secondary | ICD-10-CM | POA: Diagnosis present

## 2016-04-14 DIAGNOSIS — Y929 Unspecified place or not applicable: Secondary | ICD-10-CM | POA: Insufficient documentation

## 2016-04-14 DIAGNOSIS — F17200 Nicotine dependence, unspecified, uncomplicated: Secondary | ICD-10-CM | POA: Insufficient documentation

## 2016-04-14 DIAGNOSIS — X58XXXA Exposure to other specified factors, initial encounter: Secondary | ICD-10-CM | POA: Diagnosis not present

## 2016-04-14 DIAGNOSIS — S81801A Unspecified open wound, right lower leg, initial encounter: Secondary | ICD-10-CM | POA: Diagnosis not present

## 2016-04-14 DIAGNOSIS — Y939 Activity, unspecified: Secondary | ICD-10-CM | POA: Diagnosis not present

## 2016-04-14 DIAGNOSIS — E039 Hypothyroidism, unspecified: Secondary | ICD-10-CM | POA: Diagnosis not present

## 2016-04-14 DIAGNOSIS — D649 Anemia, unspecified: Secondary | ICD-10-CM | POA: Insufficient documentation

## 2016-04-14 DIAGNOSIS — I10 Essential (primary) hypertension: Secondary | ICD-10-CM | POA: Insufficient documentation

## 2016-04-14 DIAGNOSIS — G894 Chronic pain syndrome: Secondary | ICD-10-CM

## 2016-04-14 DIAGNOSIS — S81802A Unspecified open wound, left lower leg, initial encounter: Secondary | ICD-10-CM | POA: Insufficient documentation

## 2016-04-14 LAB — CBC WITH DIFFERENTIAL/PLATELET
BASOS ABS: 0 10*3/uL (ref 0.0–0.1)
Basophils Relative: 0 %
Eosinophils Absolute: 0.2 10*3/uL (ref 0.0–0.7)
Eosinophils Relative: 3 %
HEMATOCRIT: 30.8 % — AB (ref 36.0–46.0)
HEMOGLOBIN: 10 g/dL — AB (ref 12.0–15.0)
LYMPHS PCT: 21 %
Lymphs Abs: 1.7 10*3/uL (ref 0.7–4.0)
MCH: 30 pg (ref 26.0–34.0)
MCHC: 32.5 g/dL (ref 30.0–36.0)
MCV: 92.5 fL (ref 78.0–100.0)
MONO ABS: 0.7 10*3/uL (ref 0.1–1.0)
Monocytes Relative: 8 %
NEUTROS ABS: 5.6 10*3/uL (ref 1.7–7.7)
NEUTROS PCT: 68 %
Platelets: 315 10*3/uL (ref 150–400)
RBC: 3.33 MIL/uL — ABNORMAL LOW (ref 3.87–5.11)
RDW: 14.5 % (ref 11.5–15.5)
WBC: 8.3 10*3/uL (ref 4.0–10.5)

## 2016-04-14 LAB — COMPREHENSIVE METABOLIC PANEL
ALBUMIN: 3.9 g/dL (ref 3.5–5.0)
ALK PHOS: 64 U/L (ref 38–126)
ALT: 51 U/L (ref 14–54)
ANION GAP: 9 (ref 5–15)
AST: 41 U/L (ref 15–41)
BUN: 12 mg/dL (ref 6–20)
CALCIUM: 9.3 mg/dL (ref 8.9–10.3)
CO2: 27 mmol/L (ref 22–32)
Chloride: 104 mmol/L (ref 101–111)
Creatinine, Ser: 1.15 mg/dL — ABNORMAL HIGH (ref 0.44–1.00)
GFR calc non Af Amer: 55 mL/min — ABNORMAL LOW (ref >60)
GLUCOSE: 90 mg/dL (ref 65–99)
POTASSIUM: 4.2 mmol/L (ref 3.5–5.1)
SODIUM: 140 mmol/L (ref 135–145)
TOTAL PROTEIN: 7.3 g/dL (ref 6.5–8.1)
Total Bilirubin: 1.1 mg/dL (ref 0.3–1.2)

## 2016-04-14 MED ORDER — SULFAMETHOXAZOLE-TRIMETHOPRIM 800-160 MG PO TABS
1.0000 | ORAL_TABLET | Freq: Once | ORAL | Status: AC
  Administered 2016-04-14: 1 via ORAL
  Filled 2016-04-14: qty 1

## 2016-04-14 MED ORDER — SULFAMETHOXAZOLE-TRIMETHOPRIM 800-160 MG PO TABS: 1.0000 | ORAL_TABLET | Freq: Two times a day (BID) | ORAL | 0 refills | Status: AC

## 2016-04-14 MED ORDER — BACITRACIN ZINC 500 UNIT/GM EX OINT
TOPICAL_OINTMENT | Freq: Two times a day (BID) | CUTANEOUS | Status: DC
  Administered 2016-04-14: 21:00:00 via TOPICAL
  Filled 2016-04-14: qty 0.9

## 2016-04-14 MED ORDER — IBUPROFEN 200 MG PO TABS
400.0000 mg | ORAL_TABLET | Freq: Once | ORAL | Status: AC
  Administered 2016-04-14: 400 mg via ORAL
  Filled 2016-04-14: qty 2

## 2016-04-14 MED ORDER — BACITRACIN ZINC 500 UNIT/GM EX OINT
TOPICAL_OINTMENT | Freq: Two times a day (BID) | CUTANEOUS | Status: DC
Start: 2016-04-14 — End: 2016-04-14
  Filled 2016-04-14: qty 0.9

## 2016-04-14 NOTE — ED Provider Notes (Signed)
[facility]-EMERGENCY DEPT Provider Note   CSN: [id number] Arrival date & time: 04/14/16  1611     History   Chief Complaint Chief Complaint  Patient presents with  . Facial Pain    HPI Brittany [name] is a 51 y.o. female.Patient reports pain at her right cheek for the past 2 years which is typical of trigeminal neuralgia. She's been treated with gabapentin and tramadol, without relief. She has an appointment with a neurologist for follow-up. She has also discussed surgery for trigeminal neuralgia and pain management clinic which she has declined. She also complains of pain at her right forearm and numbness in ring finger and fifth finger of left hand since November 2017. Patient also reports sores over her legs for the past one or 2 weeks which she describes as "cellulitis" she's been treated in the past for similar sores with Bactrim. No other associated symptoms. No fever no nausea or vomiting  HPI  Past Medical History:  Diagnosis Date  . Anxiety   . Bipolar 1 disorder (HCC)   . Bulging lumbar disc   . Chronic bronchitis (HCC)   . Chronic lower back pain   . Depression   . Facial nerve injury   . Facial pain 11/2013   walking dog when she tripped and "faceplanted" landing on her face.   Marland Kitchen GERD (gastroesophageal reflux disease)   . Goiter   . Head injury   . Heart murmur   . Hyperlipidemia   . Hypertension   . Hypothyroidism   . Migraine    "weekly" (03/23/2015)  . Ruptured intervertebral disc   . Seizures (HCC)    "I've had a few; had a grand mal when I took Neurotin; still take Neurotin" (03/23/2015)  . Short-term memory loss     Patient Active Problem List   Diagnosis Date Noted  . Positive hepatitis C antibody test 12/24/2015  . Bipolar 1 disorder (HCC) 12/23/2015  . Trigeminal neuralgia 12/23/2015  . Overdose 12/22/2015  . Baclofen overdose 10/28/2015  . Severe episode of recurrent major depressive disorder, without psychotic features (HCC)   . Acute respiratory  failure with hypoxemia (HCC)   . Bipolar I disorder, most recent episode mixed (HCC) 06/14/2015  . Essential hypertension   . Sinus bradycardia 06/10/2015  . Prolonged QT interval 05/03/2015  . Morbid obesity due to excess calories (HCC)   . GERD (gastroesophageal reflux disease)   . Migraine without aura and without status migrainosus, not intractable   . Overdose 03/23/2015  . History of hepatitis C virus infection 02/26/2015  . Facial nerve injury   . Right facial pain 01/05/2015  . Polypharmacy 01/18/2012    Class: Chronic  . Borderline personality disorder 01/18/2012  . Hypothyroidism 09/16/2011    Past Surgical History:  Procedure Laterality Date  . ABDOMINAL HYSTERECTOMY    . CESAREAN SECTION  2004  . EYE SURGERY Right 11/2013   emergent lateral canthotomy due to proptosis  [name] 11/30/2013  . TONSILLECTOMY      OB History    No data available       Home Medications    Prior to Admission medications   Medication Sig Start Date End Date Taking? Authorizing Provider  DULoxetine (CYMBALTA) 60 MG capsule Take 1 capsule (60 mg total) by mouth 2 (two) times daily. 11/03/15  Yes Kathlen Mody, MD  gabapentin (NEURONTIN) 400 MG capsule Take 1 capsule (400 mg total) by mouth 3 (three) times daily. Patient taking differently: Take 400 mg by mouth 4 (  four) times daily.  11/03/15  Yes Kathlen Mody, MD  lamoTRIgine (LAMICTAL) 100 MG tablet Take 1 tablet (100 mg total) by mouth daily. Patient taking differently: Take 50 mg by mouth 2 (two) times daily.  12/24/15  Yes Althia Forts, MD  LORazepam (ATIVAN) 0.5 MG tablet Take 0.5 mg by mouth 4 (four) times daily as needed for anxiety.   Yes Historical Provider, MD  Multiple Vitamin (MULTIVITAMIN WITH MINERALS) TABS tablet Take 1 tablet by mouth daily.    Yes Historical Provider, MD  topiramate (TOPAMAX) 50 MG tablet Take 50 mg by mouth 3 (three) times daily as needed.   Yes Historical Provider, MD  traMADol (ULTRAM) 50 MG tablet Take  50-100 mg by mouth every 6 (six) hours as needed for moderate pain.    Yes Historical Provider, MD  naloxone HCl 4 MG/0.1ML LIQD For suspected opioid overdose, administer one spray into one nostril. Use a new nasal spray for subsequent doses and administer into alternating nostrils. May repeat every 2-3 minutes. 12/24/15   Althia Forts, MD  sulfamethoxazole-trimethoprim (BACTRIM DS,SEPTRA DS) 800-160 MG tablet Take 1 tablet by mouth 2 (two) times daily. 04/14/16 04/21/16  Doug Sou, MD    Family History Family History  Problem Relation Age of Onset  . Migraines Mother   . Migraines Father   . Other Father   . Migraines Sister     Social History Social History  Substance Use Topics  . Smoking status: Current Every Day Smoker  . Smokeless tobacco: Never Used  . Alcohol use No     Comment: 03/23/2015 "might drink at Christmastime"     Allergies   Toradol [ketorolac tromethamine]; Toradol [ketorolac tromethamine]; Suboxone [buprenorphine hcl-naloxone hcl]; Subutex [buprenorphine]; and Trazodone and nefazodone   Review of Systems Review of Systems  Constitutional: Negative.   HENT: Negative.   Respiratory: Negative.   Cardiovascular: Negative.   Gastrointestinal: Negative.   Musculoskeletal: Negative.   Skin: Positive for wound.       Sores on legs  Neurological: Positive for numbness.       Numbness in fourth and fifth fingers of left hand, left forearm pain, right facial pain  Psychiatric/Behavioral: Negative.   All other systems reviewed and are negative.    Physical Exam Updated Vital Signs BP 120/70 (BP Location: Left Arm)   Pulse 73   Temp 98.7 F (37.1 C) (Oral)   Resp 16   Ht 5\' 4"  (1.626 m)   Wt 209 lb 3.2 oz (94.9 kg)   LMP  (LMP Unknown)   SpO2 97%   BMI 35.91 kg/m   Physical Exam  Constitutional: She is oriented to person, place, and time. She appears well-developed and well-nourished. No distress.  HENT:  Head: Normocephalic and atraumatic.  No  facial asymmetry  Eyes: Conjunctivae are normal. Pupils are equal, round, and reactive to light.  Neck: Neck supple. No tracheal deviation present. No thyromegaly present.  Cardiovascular: Normal rate and regular rhythm.   No murmur heard. Pulmonary/Chest: Effort normal and breath sounds normal.  Abdominal: Soft. Bowel sounds are normal. She exhibits no distension. There is no tenderness.  Musculoskeletal: Normal range of motion. She exhibits no edema or tenderness.  Upper extremity without redness swelling or deformity. Full range of motion. Grip strength 5 over 5. Good capillary refill of all digits. Radial pulse 2+.  Neurological: She is alert and oriented to person, place, and time. Coordination normal.  Skin: Skin is warm and dry. No rash noted.  Bilateral  lower extremities with multiple scabbed lesions approximately dime size in diameter. Without surrounding redness or drainage. Not involving soles or feet.  Psychiatric: She has a normal mood and affect.  Nursing note and vitals reviewed.    ED Treatments / Results  Labs (all labs ordered are listed, but only abnormal results are displayed) Labs Reviewed  COMPREHENSIVE METABOLIC PANEL - Abnormal; Notable for the following:       Result Value   Creatinine, Ser 1.15 (*)    GFR calc non Af Amer 55 (*)    All other components within normal limits  CBC WITH DIFFERENTIAL/PLATELET - Abnormal; Notable for the following:    RBC 3.33 (*)    Hemoglobin 10.0 (*)    HCT 30.8 (*)    All other components within normal limits   Results for orders placed or performed during the hospital encounter of 04/14/16  Comprehensive metabolic panel  Result Value Ref Range   Sodium 140 135 - 145 mmol/L   Potassium 4.2 3.5 - 5.1 mmol/L   Chloride 104 101 - 111 mmol/L   CO2 27 22 - 32 mmol/L   Glucose, Bld 90 65 - 99 mg/dL   BUN 12 6 - 20 mg/dL   Creatinine, Ser 2.951.15 (H) 0.44 - 1.00 mg/dL   Calcium 9.3 8.9 - 62.110.3 mg/dL   Total Protein 7.3 6.5 -  8.1 g/dL   Albumin 3.9 3.5 - 5.0 g/dL   AST 41 15 - 41 U/L   ALT 51 14 - 54 U/L   Alkaline Phosphatase 64 38 - 126 U/L   Total Bilirubin 1.1 0.3 - 1.2 mg/dL   GFR calc non Af Amer 55 (L) >60 mL/min   GFR calc Af Amer >60 >60 mL/min   Anion gap 9 5 - 15  CBC with Differential  Result Value Ref Range   WBC 8.3 4.0 - 10.5 K/uL   RBC 3.33 (L) 3.87 - 5.11 MIL/uL   Hemoglobin 10.0 (L) 12.0 - 15.0 g/dL   HCT 30.830.8 (L) 65.736.0 - 84.646.0 %   MCV 92.5 78.0 - 100.0 fL   MCH 30.0 26.0 - 34.0 pg   MCHC 32.5 30.0 - 36.0 g/dL   RDW 96.214.5 95.211.5 - 84.115.5 %   Platelets 315 150 - 400 K/uL   Neutrophils Relative % 68 %   Neutro Abs 5.6 1.7 - 7.7 K/uL   Lymphocytes Relative 21 %   Lymphs Abs 1.7 0.7 - 4.0 K/uL   Monocytes Relative 8 %   Monocytes Absolute 0.7 0.1 - 1.0 K/uL   Eosinophils Relative 3 %   Eosinophils Absolute 0.2 0.0 - 0.7 K/uL   Basophils Relative 0 %   Basophils Absolute 0.0 0.0 - 0.1 K/uL   No results found. EKG  EKG Interpretation None       Radiology No results found.  Procedures Procedures (including critical care time)  Medications Ordered in ED Medications  bacitracin ointment (not administered)  sulfamethoxazole-trimethoprim (BACTRIM DS,SEPTRA DS) 800-160 MG per tablet 1 tablet (not administered)  ibuprofen (ADVIL,MOTRIN) tablet 400 mg (400 mg Oral Given 04/14/16 2034)     Initial Impression / Assessment and Plan / ED Course  I have reviewed the triage vital signs and the nursing notes.  Pertinent labs & imaging results that were available during my care of the patient were reviewed by me and considered in my medical decision making (see chart for details).     I see no signs of cellulitis however I'll empirically treat  multiple leg wounds with Bactrim. As well as topical antibiotic, bacitracin. Patient is asking for Percocet by name. Which I declined. Suggests Advil for pain as her pain is largely chronic. She has an appointment with a neurologist within a week. I've  advised her to follow up at urgent care center or to find a primary care physician for recheck of leg wounds Anemia is chronic Final Clinical Impressions(s) / ED Diagnoses  Diagnosis #1 chronic pain #2 open wounds of legs #3 anemia Final diagnoses:  None    New Prescriptions New Prescriptions   SULFAMETHOXAZOLE-TRIMETHOPRIM (BACTRIM DS,SEPTRA DS) 800-160 MG TABLET    Take 1 tablet by mouth 2 (two) times daily.     Doug Sou, MD 04/15/16 [phone]

## 2016-04-14 NOTE — Discharge Instructions (Signed)
Keep your scheduled appointment with your neurologist regarding chronic pain in your face and right arm.. Wash wounds on your legs daily with soap and water and place a thin layer of bacitracin ointment over the wounds and cover with a bandage. Band-Aids should work well. Take the antibiotic as prescribed. Take Advil as directed for pain. Get your wounds rechecked next week at an urgent care center or call the number on these instructions to get a primary care physician

## 2016-04-14 NOTE — ED Triage Notes (Signed)
Pt verbalizes flare up of right facial pain related to hx of trigeminal neuralgia onset yesterday unrelieved pain by tramadol.Pt continues to verbalizes right arm pain related to hx of ulnar nerve entrapment unrelieved by anti inflammatory.

## 2016-04-14 NOTE — ED Notes (Addendum)
Pt presents with several open sores on bila legs.  Pt constantly tries to peel a piece of scab off of one of the larger wounds.  Pt reports severe pain.  She was wondering if she can have an ultram to take while she is in the ED but not to take home.  Asked pt if she has asked the EDP for it, she states that she asked the EDP for a percocet but the EDP said no.

## 2016-04-14 NOTE — ED Notes (Signed)
Post triage pt verbalizes generalized sores over legs. Pt has tissue placed in all areas of sores.

## 2016-05-14 ENCOUNTER — Encounter (HOSPITAL_COMMUNITY): Payer: Self-pay | Admitting: Emergency Medicine

## 2016-05-14 ENCOUNTER — Emergency Department (HOSPITAL_COMMUNITY)
Admission: EM | Admit: 2016-05-14 | Discharge: 2016-05-15 | Disposition: A | Discharge status: Other Acute Inpatient Hospital | Payer: Medicaid Other | Attending: Emergency Medicine | Admitting: Emergency Medicine

## 2016-05-14 DIAGNOSIS — I10 Essential (primary) hypertension: Secondary | ICD-10-CM | POA: Insufficient documentation

## 2016-05-14 DIAGNOSIS — F1910 Other psychoactive substance abuse, uncomplicated: Secondary | ICD-10-CM

## 2016-05-14 DIAGNOSIS — F1210 Cannabis abuse, uncomplicated: Secondary | ICD-10-CM | POA: Insufficient documentation

## 2016-05-14 DIAGNOSIS — G8929 Other chronic pain: Secondary | ICD-10-CM | POA: Insufficient documentation

## 2016-05-14 DIAGNOSIS — R45851 Suicidal ideations: Secondary | ICD-10-CM

## 2016-05-14 DIAGNOSIS — F329 Major depressive disorder, single episode, unspecified: Secondary | ICD-10-CM | POA: Insufficient documentation

## 2016-05-14 DIAGNOSIS — E039 Hypothyroidism, unspecified: Secondary | ICD-10-CM | POA: Insufficient documentation

## 2016-05-14 DIAGNOSIS — F17200 Nicotine dependence, unspecified, uncomplicated: Secondary | ICD-10-CM | POA: Insufficient documentation

## 2016-05-14 DIAGNOSIS — F1310 Sedative, hypnotic or anxiolytic abuse, uncomplicated: Secondary | ICD-10-CM | POA: Insufficient documentation

## 2016-05-14 DIAGNOSIS — Z79899 Other long term (current) drug therapy: Secondary | ICD-10-CM | POA: Insufficient documentation

## 2016-05-14 LAB — COMPREHENSIVE METABOLIC PANEL
ALK PHOS: 75 U/L (ref 38–126)
ALT: 23 U/L (ref 14–54)
ANION GAP: 11 (ref 5–15)
AST: 27 U/L (ref 15–41)
Albumin: 3.9 g/dL (ref 3.5–5.0)
BILIRUBIN TOTAL: 0.7 mg/dL (ref 0.3–1.2)
BUN: 10 mg/dL (ref 6–20)
CALCIUM: 9.4 mg/dL (ref 8.9–10.3)
CO2: 25 mmol/L (ref 22–32)
Chloride: 105 mmol/L (ref 101–111)
Creatinine, Ser: 0.96 mg/dL (ref 0.44–1.00)
GFR calc non Af Amer: >60 mL/min (ref >60)
Glucose, Bld: 83 mg/dL (ref 65–99)
Potassium: 3.6 mmol/L (ref 3.5–5.1)
SODIUM: 141 mmol/L (ref 135–145)
TOTAL PROTEIN: 7.2 g/dL (ref 6.5–8.1)

## 2016-05-14 LAB — CBC
HCT: 32.6 % — ABNORMAL LOW (ref 36.0–46.0)
Hemoglobin: 10.5 g/dL — ABNORMAL LOW (ref 12.0–15.0)
MCH: 30 pg (ref 26.0–34.0)
MCHC: 32.2 g/dL (ref 30.0–36.0)
MCV: 93.1 fL (ref 78.0–100.0)
PLATELETS: 268 10*3/uL (ref 150–400)
RBC: 3.5 MIL/uL — ABNORMAL LOW (ref 3.87–5.11)
RDW: 14 % (ref 11.5–15.5)
WBC: 10.8 10*3/uL — ABNORMAL HIGH (ref 4.0–10.5)

## 2016-05-14 LAB — RAPID URINE DRUG SCREEN, HOSP PERFORMED
Amphetamines: NOT DETECTED
Barbiturates: NOT DETECTED
Benzodiazepines: POSITIVE — AB
COCAINE: NOT DETECTED
OPIATES: NOT DETECTED
Tetrahydrocannabinol: POSITIVE — AB

## 2016-05-14 LAB — ACETAMINOPHEN LEVEL

## 2016-05-14 LAB — SALICYLATE LEVEL

## 2016-05-14 LAB — ETHANOL: ALCOHOL ETHYL (B): 112 mg/dL — AB (ref <5)

## 2016-05-14 MED ORDER — ADULT MULTIVITAMIN W/MINERALS CH
1.0000 | ORAL_TABLET | Freq: Every day | ORAL | Status: DC
  Administered 2016-05-15: 1 via ORAL
  Filled 2016-05-14: qty 1

## 2016-05-14 MED ORDER — BUTALBITAL-APAP-CAFFEINE 50-325-40 MG PO TABS
1.0000 | ORAL_TABLET | ORAL | Status: DC | PRN
  Administered 2016-05-15 (×2): 1 via ORAL
  Filled 2016-05-14 (×2): qty 1

## 2016-05-14 MED ORDER — DULOXETINE HCL 60 MG PO CPEP
60.0000 mg | ORAL_CAPSULE | Freq: Two times a day (BID) | ORAL | Status: DC
  Administered 2016-05-15 (×2): 60 mg via ORAL
  Filled 2016-05-14 (×2): qty 1

## 2016-05-14 MED ORDER — LAMOTRIGINE 100 MG PO TABS
100.0000 mg | ORAL_TABLET | Freq: Every day | ORAL | Status: DC
  Administered 2016-05-15: 100 mg via ORAL
  Filled 2016-05-14: qty 1

## 2016-05-14 MED ORDER — TOPIRAMATE 25 MG PO TABS: 50.0000 mg | ORAL_TABLET | ORAL | Status: DC | PRN

## 2016-05-14 MED ORDER — LORAZEPAM 0.5 MG PO TABS
0.5000 mg | ORAL_TABLET | Freq: Two times a day (BID) | ORAL | Status: DC | PRN
  Administered 2016-05-15: 0.5 mg via ORAL
  Filled 2016-05-14: qty 1

## 2016-05-14 MED ORDER — GABAPENTIN 400 MG PO CAPS
400.0000 mg | ORAL_CAPSULE | Freq: Three times a day (TID) | ORAL | Status: DC
  Administered 2016-05-15 (×3): 400 mg via ORAL
  Filled 2016-05-14 (×3): qty 1

## 2016-05-14 NOTE — ED Triage Notes (Signed)
Reports being suicidal due to chronic pain issues.  Plan to cut wrists.  Multiple scars noted to arms.  Drowsy in triage.
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
## 2016-05-14 NOTE — ED Notes (Signed)
Dr. Clarene Duke notified of pt going to [facility].

## 2016-05-14 NOTE — ED Provider Notes (Signed)
[facility]-EMERGENCY DEPT Provider Note   CSN: [id number] Arrival date & time: 05/14/16  2038     History   Chief Complaint Chief Complaint  Patient presents with  . Suicidal    HPI Brittany [name] is a 51 y.o. female.  HPI  Pt was seen at 2125. Per pt, c/o gradual onset and worsening of persistent SI since yesterday. Pt has plan to "cut her wrists." Pt denies SA, no HI, no hallucinations.   Past Medical History:  Diagnosis Date  . Anxiety   . Bipolar 1 disorder (HCC)   . Bulging lumbar disc   . Chronic bronchitis (HCC)   . Chronic lower back pain   . Depression   . Facial nerve injury   . Facial pain 11/2013   walking dog when she tripped and "faceplanted" landing on her face.   Marland Kitchen GERD (gastroesophageal reflux disease)   . Goiter   . Head injury   . Heart murmur   . Hyperlipidemia   . Hypertension   . Hypothyroidism   . Migraine    "weekly" (03/23/2015)  . Ruptured intervertebral disc   . Seizures (HCC)    "I've had a few; had a grand mal when I took Neurotin; still take Neurotin" (03/23/2015)  . Short-term memory loss     Patient Active Problem List   Diagnosis Date Noted  . Positive hepatitis C antibody test 12/24/2015  . Bipolar 1 disorder (HCC) 12/23/2015  . Trigeminal neuralgia 12/23/2015  . Overdose 12/22/2015  . Baclofen overdose 10/28/2015  . Severe episode of recurrent major depressive disorder, without psychotic features (HCC)   . Acute respiratory failure with hypoxemia (HCC)   . Bipolar I disorder, most recent episode mixed (HCC) 06/14/2015  . Essential hypertension   . Sinus bradycardia 06/10/2015  . Prolonged QT interval 05/03/2015  . Morbid obesity due to excess calories (HCC)   . GERD (gastroesophageal reflux disease)   . Migraine without aura and without status migrainosus, not intractable   . Overdose 03/23/2015  . History of hepatitis C virus infection 02/26/2015  . Facial nerve injury   . Right facial pain 01/05/2015  . Polypharmacy  01/18/2012    Class: Chronic  . Borderline personality disorder 01/18/2012  . Hypothyroidism 09/16/2011    Past Surgical History:  Procedure Laterality Date  . ABDOMINAL HYSTERECTOMY    . CESAREAN SECTION  2004  . EYE SURGERY Right 11/2013   emergent lateral canthotomy due to proptosis  [name] 11/30/2013  . TONSILLECTOMY      OB History    No data available       Home Medications    Prior to Admission medications   Medication Sig Start Date End Date Taking? Authorizing Provider  butalbital-acetaminophen-caffeine (FIORICET, ESGIC) 50-325-40 MG tablet Take 1 tablet by mouth every 4 (four) hours as needed for headache or migraine.   Yes Historical Provider, MD  DULoxetine (CYMBALTA) 60 MG capsule Take 1 capsule (60 mg total) by mouth 2 (two) times daily. 11/03/15  Yes Kathlen Mody, MD  gabapentin (NEURONTIN) 400 MG capsule Take 1 capsule (400 mg total) by mouth 3 (three) times daily. Patient taking differently: Take 400 mg by mouth 2 (two) times daily.  11/03/15  Yes Kathlen Mody, MD  lamoTRIgine (LAMICTAL) 100 MG tablet Take 1 tablet (100 mg total) by mouth daily. Patient taking differently: Take 100 mg by mouth 2 (two) times daily.  12/24/15  Yes Althia Forts, MD  LORazepam (ATIVAN) 0.5 MG tablet Take 0.5 mg by  mouth 2 (two) times daily as needed for anxiety.    Yes Historical Provider, MD  Multiple Vitamin (MULTIVITAMIN WITH MINERALS) TABS tablet Take 1 tablet by mouth daily.    Yes Historical Provider, MD  naproxen sodium (ALEVE) 220 MG tablet Take 880-1,100 mg by mouth 2 (two) times daily as needed (pain).   Yes Historical Provider, MD  topiramate (TOPAMAX) 50 MG tablet Take 50 mg by mouth every 4 (four) hours as needed (nerve pain).    Yes Historical Provider, MD  traMADol (ULTRAM) 50 MG tablet Take 100 mg by mouth every 6 (six) hours as needed for moderate pain.    Yes Historical Provider, MD    Family History Family History  Problem Relation Age of Onset  . Migraines  Mother   . Migraines Father   . Other Father   . Migraines Sister     Social History Social History  Substance Use Topics  . Smoking status: Current Every Day Smoker  . Smokeless tobacco: Never Used  . Alcohol use No     Comment: 03/23/2015 "might drink at Christmastime"     Allergies   Toradol [ketorolac tromethamine]; Suboxone [buprenorphine hcl-naloxone hcl]; Subutex [buprenorphine]; and Trazodone and nefazodone   Review of Systems Review of Systems ROS: Statement: All systems negative except as marked or noted in the HPI; Constitutional: Negative for fever and chills. ; ; Eyes: Negative for eye pain, redness and discharge. ; ; ENMT: Negative for ear pain, hoarseness, nasal congestion, sinus pressure and sore throat. ; ; Cardiovascular: Negative for chest pain, palpitations, diaphoresis, dyspnea and peripheral edema. ; ; Respiratory: Negative for cough, wheezing and stridor. ; ; Gastrointestinal: Negative for nausea, vomiting, diarrhea, abdominal pain, blood in stool, hematemesis, jaundice and rectal bleeding. . ; ; Genitourinary: Negative for dysuria, flank pain and hematuria. ; ; Musculoskeletal: Negative for back pain and neck pain. Negative for swelling and trauma.; ; Skin: Negative for pruritus, rash, abrasions, blisters, bruising and skin lesion.; ; Neuro: Negative for headache, lightheadedness and neck stiffness. Negative for weakness, altered level of consciousness, altered mental status, extremity weakness, paresthesias, involuntary movement, seizure and syncope.; Psych:  +SI, no SA, no HI, no hallucinations.      Physical Exam Updated Vital Signs BP 100/74 (BP Location: Left Arm)   Pulse (!) 107   Temp 98.5 F (36.9 C) (Oral)   Resp 18   Ht  (1.626 m)   Wt 204 lb 6 oz (92.7 kg)   LMP  (LMP Unknown)   SpO2 96%   BMI 35.08 kg/m   Physical Exam 2130: Physical examination:  Nursing notes reviewed; Vital signs and O2 SAT reviewed;  Constitutional: Well developed,  Well nourished, Well hydrated, In no acute distress; Head:  Normocephalic, atraumatic; Eyes: EOMI, PERRL, No scleral icterus; ENMT: Mouth and pharynx normal, Mucous membranes moist; Neck: Supple, Full range of motion; Cardiovascular: Regular rate and rhythm; Respiratory: Breath sounds clear, No wheezes.  Speaking full sentences with ease, Normal respiratory effort/excursion; Chest: No deformity, Movement normal; Abdomen: Nondistended; Extremities: No deformity.; Neuro: AA&Ox3, Major CN grossly intact.  Speech clear. No gross focal motor deficits in extremities. Climbs on and off stretcher easily by herself. Gait steady.; Skin: Color normal, Warm, Dry.; Psych:  Affect flat. Endorses SI.    ED Treatments / Results  Labs (all labs ordered are listed, but only abnormal results are displayed)   EKG  EKG Interpretation None       Radiology   Procedures Procedures (including critical  care time)  Medications Ordered in ED Medications - No data to display   Initial Impression / Assessment and Plan / ED Course  I have reviewed the triage vital signs and the nursing notes.  Pertinent labs & imaging results that were available during my care of the patient were reviewed by me and considered in my medical decision making (see chart for details).  MDM Reviewed: previous chart, nursing note and vitals Reviewed previous: labs Interpretation: labs   Results for orders placed or performed during the hospital encounter of 05/14/16  Comprehensive metabolic panel  Result Value Ref Range   Sodium 141 135 - 145 mmol/L   Potassium 3.6 3.5 - 5.1 mmol/L   Chloride 105 101 - 111 mmol/L   CO2 25 22 - 32 mmol/L   Glucose, Bld 83 65 - 99 mg/dL   BUN 10 6 - 20 mg/dL   Creatinine, Ser 1.61 0.44 - 1.00 mg/dL   Calcium 9.4 8.9 - 09.6 mg/dL   Total Protein 7.2 6.5 - 8.1 g/dL   Albumin 3.9 3.5 - 5.0 g/dL   AST 27 15 - 41 U/L   ALT 23 14 - 54 U/L   Alkaline Phosphatase 75 38 - 126 U/L   Total  Bilirubin 0.7 0.3 - 1.2 mg/dL   GFR calc non Af Amer >60 >60 mL/min   GFR calc Af Amer >60 >60 mL/min   Anion gap 11 5 - 15  Ethanol  Result Value Ref Range   Alcohol, Ethyl (B) 112 (H) <5 mg/dL  Salicylate level  Result Value Ref Range   Salicylate Lvl <7.0 2.8 - 30.0 mg/dL  Acetaminophen level  Result Value Ref Range   Acetaminophen (Tylenol), Serum <10 (L) 10 - 30 ug/mL  cbc  Result Value Ref Range   WBC 10.8 (H) 4.0 - 10.5 K/uL   RBC 3.50 (L) 3.87 - 5.11 MIL/uL   Hemoglobin 10.5 (L) 12.0 - 15.0 g/dL   HCT 04.5 (L) 40.9 - 81.1 %   MCV 93.1 78.0 - 100.0 fL   MCH 30.0 26.0 - 34.0 pg   MCHC 32.2 30.0 - 36.0 g/dL   RDW 91.4 78.2 - 95.6 %   Platelets 268 150 - 400 K/uL  Rapid urine drug screen (hospital performed)  Result Value Ref Range   Opiates NONE DETECTED NONE DETECTED   Cocaine NONE DETECTED NONE DETECTED   Benzodiazepines POSITIVE (A) NONE DETECTED   Amphetamines NONE DETECTED NONE DETECTED   Tetrahydrocannabinol POSITIVE (A) NONE DETECTED   Barbiturates NONE DETECTED NONE DETECTED    2245:  TTS to evaluate. Holding orders written.    Final Clinical Impressions(s) / ED Diagnoses   Final diagnoses:  None    New Prescriptions New Prescriptions   No medications on file     Samuel Jester, DO 05/14/16 2258

## 2016-05-15 ENCOUNTER — Inpatient Hospital Stay (HOSPITAL_COMMUNITY)
Admission: AD | Admit: 2016-05-15 | Discharge: 2016-05-20 | DRG: 885 | Disposition: A | Discharge status: Home / Self Care | Payer: Medicaid Other | Source: Intra-hospital | Attending: Psychiatry | Admitting: Psychiatry

## 2016-05-15 ENCOUNTER — Inpatient Hospital Stay (HOSPITAL_COMMUNITY): Admit: 2016-05-15 | Payer: Medicaid Other

## 2016-05-15 DIAGNOSIS — R45851 Suicidal ideations: Secondary | ICD-10-CM | POA: Diagnosis present

## 2016-05-15 DIAGNOSIS — Z8669 Personal history of other diseases of the nervous system and sense organs: Secondary | ICD-10-CM

## 2016-05-15 DIAGNOSIS — Z9049 Acquired absence of other specified parts of digestive tract: Secondary | ICD-10-CM

## 2016-05-15 DIAGNOSIS — Z716 Tobacco abuse counseling: Secondary | ICD-10-CM | POA: Diagnosis not present

## 2016-05-15 DIAGNOSIS — Z818 Family history of other mental and behavioral disorders: Secondary | ICD-10-CM | POA: Diagnosis not present

## 2016-05-15 DIAGNOSIS — I10 Essential (primary) hypertension: Secondary | ICD-10-CM | POA: Diagnosis present

## 2016-05-15 DIAGNOSIS — F1020 Alcohol dependence, uncomplicated: Secondary | ICD-10-CM | POA: Diagnosis present

## 2016-05-15 DIAGNOSIS — F411 Generalized anxiety disorder: Secondary | ICD-10-CM | POA: Diagnosis present

## 2016-05-15 DIAGNOSIS — Z885 Allergy status to narcotic agent status: Secondary | ICD-10-CM | POA: Diagnosis not present

## 2016-05-15 DIAGNOSIS — Z79899 Other long term (current) drug therapy: Secondary | ICD-10-CM | POA: Diagnosis not present

## 2016-05-15 DIAGNOSIS — J42 Unspecified chronic bronchitis: Secondary | ICD-10-CM | POA: Diagnosis present

## 2016-05-15 DIAGNOSIS — Z888 Allergy status to other drugs, medicaments and biological substances status: Secondary | ICD-10-CM | POA: Diagnosis not present

## 2016-05-15 DIAGNOSIS — K219 Gastro-esophageal reflux disease without esophagitis: Secondary | ICD-10-CM | POA: Diagnosis present

## 2016-05-15 DIAGNOSIS — G500 Trigeminal neuralgia: Secondary | ICD-10-CM | POA: Diagnosis present

## 2016-05-15 DIAGNOSIS — G4700 Insomnia, unspecified: Secondary | ICD-10-CM | POA: Diagnosis present

## 2016-05-15 DIAGNOSIS — Z23 Encounter for immunization: Secondary | ICD-10-CM | POA: Diagnosis not present

## 2016-05-15 DIAGNOSIS — M545 Low back pain: Secondary | ICD-10-CM | POA: Diagnosis present

## 2016-05-15 DIAGNOSIS — Z915 Personal history of self-harm: Secondary | ICD-10-CM

## 2016-05-15 DIAGNOSIS — F1320 Sedative, hypnotic or anxiolytic dependence, uncomplicated: Secondary | ICD-10-CM | POA: Diagnosis present

## 2016-05-15 DIAGNOSIS — G8929 Other chronic pain: Secondary | ICD-10-CM | POA: Diagnosis present

## 2016-05-15 DIAGNOSIS — F3130 Bipolar disorder, current episode depressed, mild or moderate severity, unspecified: Principal | ICD-10-CM | POA: Diagnosis present

## 2016-05-15 DIAGNOSIS — F17210 Nicotine dependence, cigarettes, uncomplicated: Secondary | ICD-10-CM | POA: Diagnosis present

## 2016-05-15 DIAGNOSIS — F603 Borderline personality disorder: Secondary | ICD-10-CM | POA: Diagnosis present

## 2016-05-15 DIAGNOSIS — F1290 Cannabis use, unspecified, uncomplicated: Secondary | ICD-10-CM | POA: Diagnosis not present

## 2016-05-15 DIAGNOSIS — B1920 Unspecified viral hepatitis C without hepatic coma: Secondary | ICD-10-CM | POA: Diagnosis present

## 2016-05-15 DIAGNOSIS — E785 Hyperlipidemia, unspecified: Secondary | ICD-10-CM | POA: Diagnosis present

## 2016-05-15 DIAGNOSIS — E039 Hypothyroidism, unspecified: Secondary | ICD-10-CM | POA: Diagnosis present

## 2016-05-15 DIAGNOSIS — Z6835 Body mass index (BMI) 35.0-35.9, adult: Secondary | ICD-10-CM

## 2016-05-15 MED ORDER — MAGNESIUM HYDROXIDE 400 MG/5ML PO SUSP: 30.0000 mL | Freq: Every day | ORAL | Status: DC | PRN

## 2016-05-15 MED ORDER — DULOXETINE HCL 60 MG PO CPEP
60.0000 mg | ORAL_CAPSULE | Freq: Two times a day (BID) | ORAL | Status: DC
  Administered 2016-05-16 – 2016-05-20 (×9): 60 mg via ORAL
  Filled 2016-05-15 (×12): qty 1

## 2016-05-15 MED ORDER — IBUPROFEN 600 MG PO TABS
600.0000 mg | ORAL_TABLET | Freq: Four times a day (QID) | ORAL | Status: DC | PRN
  Administered 2016-05-16 – 2016-05-17 (×4): 600 mg via ORAL
  Filled 2016-05-15 (×3): qty 1

## 2016-05-15 MED ORDER — ALUM & MAG HYDROXIDE-SIMETH 200-200-20 MG/5ML PO SUSP: 30.0000 mL | ORAL | Status: DC | PRN

## 2016-05-15 MED ORDER — TOPIRAMATE 25 MG PO TABS
50.0000 mg | ORAL_TABLET | Freq: Two times a day (BID) | ORAL | Status: DC
  Administered 2016-05-16 – 2016-05-20 (×9): 50 mg via ORAL
  Filled 2016-05-15 (×12): qty 2

## 2016-05-15 MED ORDER — LAMOTRIGINE 100 MG PO TABS
100.0000 mg | ORAL_TABLET | Freq: Two times a day (BID) | ORAL | Status: DC
  Administered 2016-05-16: 100 mg via ORAL
  Filled 2016-05-15 (×5): qty 1

## 2016-05-15 MED ORDER — GABAPENTIN 400 MG PO CAPS
400.0000 mg | ORAL_CAPSULE | Freq: Two times a day (BID) | ORAL | Status: DC
Start: 2016-05-16 — End: 2016-05-16
  Administered 2016-05-16: 400 mg via ORAL
  Filled 2016-05-15 (×5): qty 1

## 2016-05-15 MED ORDER — TRAMADOL HCL 50 MG PO TABS
50.0000 mg | ORAL_TABLET | Freq: Once | ORAL | Status: AC
  Administered 2016-05-15: 50 mg via ORAL
  Filled 2016-05-15: qty 1

## 2016-05-15 MED ORDER — BUTALBITAL-APAP-CAFFEINE 50-325-40 MG PO TABS
1.0000 | ORAL_TABLET | ORAL | Status: DC | PRN
  Administered 2016-05-16 – 2016-05-20 (×7): 1 via ORAL
  Filled 2016-05-15 (×7): qty 1

## 2016-05-15 MED ORDER — ACETAMINOPHEN 325 MG PO TABS
650.0000 mg | ORAL_TABLET | Freq: Four times a day (QID) | ORAL | Status: DC | PRN
  Administered 2016-05-18 – 2016-05-19 (×4): 650 mg via ORAL
  Filled 2016-05-15 (×4): qty 2

## 2016-05-15 MED ORDER — IBUPROFEN 400 MG PO TABS
400.0000 mg | ORAL_TABLET | Freq: Once | ORAL | Status: AC
  Administered 2016-05-15: 400 mg via ORAL
  Filled 2016-05-15: qty 1

## 2016-05-15 NOTE — Progress Notes (Signed)
Pt accepted at [facility].  Dr. Stark Klein accepting, Call report to [phone].  Timmothy Euler. Kaylyn Lim, MSW, LCSWA Clinical Social Work Disposition [phone]

## 2016-05-15 NOTE — ED Triage Notes (Signed)
I made a TC to [facility] To give report for Pt transfer. Staff reported they would only take report when Transport was present to pick up PT.

## 2016-05-15 NOTE — ED Triage Notes (Signed)
TC to [facility] assessment Dept . Left message that Pt did not want to go that far because of court date on 05/25/16.

## 2016-05-15 NOTE — ED Notes (Signed)
Called received from [facility] . Bed offer extended to pt if blood pressure has gone up.

## 2016-05-15 NOTE — ED Triage Notes (Signed)
Reported Pt's low BP  to EDP [place] . No new orders.

## 2016-05-15 NOTE — ED Triage Notes (Signed)
Pt requesting more pain meds. Pt requesting  Ultram and ibuprofen for pain.

## 2016-05-15 NOTE — ED Triage Notes (Signed)
TTS in progress 

## 2016-05-15 NOTE — Progress Notes (Signed)
Pt meets criteria for inpatient admission; CSW faxed referral packet to the following facilities in attempts to secure inpatient bed:   [facility]  TTS will continue to seek placement.   Vernie Shanks, LCSW Clinical Social Work [phone]

## 2016-05-15 NOTE — ED Triage Notes (Addendum)
PT reported she had a little HA that might get worse . Pt asked if she could have   Fioricet.

## 2016-05-15 NOTE — ED Triage Notes (Signed)
Pt reports She does not want to go to [facility]. . Pt reports She has a court fine to pay. Pt does not want to be that far from home. Attempted to call Erskine Squibb @ [facility] to report Pt's  concerns .

## 2016-05-15 NOTE — ED Triage Notes (Signed)
TC from [place] , Pt is being evaluated for possible placement at [facility].  Alona Bene ,RN updated with information.

## 2016-05-15 NOTE — Progress Notes (Signed)
Pt. refused bed off at [facility].  CSW called to notify.    CSW contacted [facility] ED to notify that [facility] has made a bed offer to bed 304-1.  Dr. Jama Flavors accepting.  Call report to [phone].  Timmothy Euler. Kaylyn Lim, MSW, LCSWA Clinical Social Work Disposition [phone]

## 2016-05-15 NOTE — [facility] Counselor (Signed)
Reassessment TTS: Pt was depressed talking to Clinical research associate and states that she is still feeling suicidal. 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 She states that the pain and stress is starting to impact her as well as her fiance and she feels hopeless. She rates her depression at a 9 out 10 with 10 being the worst. Pt states that she still feels like she needs to be somewhere safe and can not contract for safety. Pt continues to meet inpatient criteria. TTS to place.  [address] [place], [address]

## 2016-05-16 ENCOUNTER — Encounter (HOSPITAL_COMMUNITY): Payer: Self-pay | Admitting: Emergency Medicine

## 2016-05-16 DIAGNOSIS — Z8669 Personal history of other diseases of the nervous system and sense organs: Secondary | ICD-10-CM

## 2016-05-16 DIAGNOSIS — F3130 Bipolar disorder, current episode depressed, mild or moderate severity, unspecified: Secondary | ICD-10-CM | POA: Diagnosis present

## 2016-05-16 DIAGNOSIS — F1020 Alcohol dependence, uncomplicated: Secondary | ICD-10-CM | POA: Diagnosis present

## 2016-05-16 DIAGNOSIS — Z79899 Other long term (current) drug therapy: Secondary | ICD-10-CM

## 2016-05-16 DIAGNOSIS — F1320 Sedative, hypnotic or anxiolytic dependence, uncomplicated: Secondary | ICD-10-CM | POA: Diagnosis present

## 2016-05-16 MED ORDER — IBUPROFEN 600 MG PO TABS
ORAL_TABLET | ORAL | Status: AC
  Administered 2016-05-16: 600 mg via ORAL
  Filled 2016-05-16: qty 1

## 2016-05-16 MED ORDER — CHLORDIAZEPOXIDE HCL 25 MG PO CAPS
25.0000 mg | ORAL_CAPSULE | Freq: Every day | ORAL | Status: AC
  Administered 2016-05-18: 25 mg via ORAL

## 2016-05-16 MED ORDER — HYDROXYZINE HCL 25 MG PO TABS
25.0000 mg | ORAL_TABLET | ORAL | Status: DC | PRN
  Administered 2016-05-18 – 2016-05-19 (×7): 25 mg via ORAL
  Filled 2016-05-16 (×9): qty 1

## 2016-05-16 MED ORDER — GABAPENTIN 300 MG PO CAPS
600.0000 mg | ORAL_CAPSULE | Freq: Three times a day (TID) | ORAL | Status: DC
  Administered 2016-05-16 – 2016-05-19 (×11): 600 mg via ORAL
  Filled 2016-05-16 (×18): qty 2

## 2016-05-16 MED ORDER — CHLORDIAZEPOXIDE HCL 25 MG PO CAPS
25.0000 mg | ORAL_CAPSULE | Freq: Three times a day (TID) | ORAL | Status: DC
  Administered 2016-05-18 (×2): 25 mg via ORAL
  Filled 2016-05-16 (×3): qty 1

## 2016-05-16 MED ORDER — CHLORDIAZEPOXIDE HCL 25 MG PO CAPS
25.0000 mg | ORAL_CAPSULE | Freq: Four times a day (QID) | ORAL | Status: DC | PRN
  Administered 2016-05-18: 25 mg via ORAL
  Filled 2016-05-16 (×2): qty 1

## 2016-05-16 MED ORDER — THIAMINE HCL 100 MG/ML IJ SOLN
100.0000 mg | Freq: Once | INTRAMUSCULAR | Status: AC
Start: 2016-05-16 — End: 2016-05-16
  Administered 2016-05-16: 100 mg via INTRAMUSCULAR
  Filled 2016-05-16: qty 2

## 2016-05-16 MED ORDER — PNEUMOCOCCAL VAC POLYVALENT 25 MCG/0.5ML IJ INJ
0.5000 mL | INJECTION | INTRAMUSCULAR | Status: AC
  Administered 2016-05-17: 0.5 mL via INTRAMUSCULAR

## 2016-05-16 MED ORDER — ADULT MULTIVITAMIN W/MINERALS CH
1.0000 | ORAL_TABLET | Freq: Every day | ORAL | Status: DC
  Administered 2016-05-16 – 2016-05-20 (×5): 1 via ORAL
  Filled 2016-05-16 (×7): qty 1

## 2016-05-16 MED ORDER — ENSURE ENLIVE PO LIQD
237.0000 mL | Freq: Two times a day (BID) | ORAL | Status: DC
  Administered 2016-05-16 – 2016-05-20 (×5): 237 mL via ORAL

## 2016-05-16 MED ORDER — VITAMIN B-1 100 MG PO TABS
100.0000 mg | ORAL_TABLET | Freq: Every day | ORAL | Status: DC
  Administered 2016-05-17 – 2016-05-20 (×4): 100 mg via ORAL
  Filled 2016-05-16 (×6): qty 1

## 2016-05-16 MED ORDER — CHLORDIAZEPOXIDE HCL 25 MG PO CAPS: 25.0000 mg | ORAL_CAPSULE | ORAL | Status: DC

## 2016-05-16 MED ORDER — MIRTAZAPINE 15 MG PO TBDP
15.0000 mg | ORAL_TABLET | Freq: Every evening | ORAL | Status: DC | PRN
  Administered 2016-05-16 – 2016-05-17 (×4): 15 mg via ORAL
  Filled 2016-05-16 (×11): qty 1

## 2016-05-16 MED ORDER — CARBAMAZEPINE 100 MG PO CHEW
200.0000 mg | CHEWABLE_TABLET | Freq: Three times a day (TID) | ORAL | Status: DC
  Administered 2016-05-18: 200 mg via ORAL
  Filled 2016-05-16 (×13): qty 2

## 2016-05-16 MED ORDER — CHLORDIAZEPOXIDE HCL 25 MG PO CAPS
25.0000 mg | ORAL_CAPSULE | Freq: Four times a day (QID) | ORAL | Status: AC
  Administered 2016-05-16 – 2016-05-17 (×6): 25 mg via ORAL
  Filled 2016-05-16 (×4): qty 1

## 2016-05-16 NOTE — H&P (Signed)
Psychiatric Admission Assessment Adult  Patient Identification: Brittany [name]  MRN:  [id number]  Date of Evaluation:  05/16/2016  Chief Complaint:  MDD  Principal Diagnosis: Worsening symptoms of depression triggering suicidal ideations.  Diagnosis:   Patient Active Problem List   Diagnosis Date Noted  . Bipolar affective disorder, depressed ([place]) [F31.30] 05/15/2016  . Positive hepatitis C antibody test [R76.8] 12/24/2015  . Bipolar 1 disorder ([place]) [F31.9] 12/23/2015  . Trigeminal neuralgia [G50.0] 12/23/2015  . Overdose [T50.901A] 12/22/2015  . Baclofen overdose [T42.8X1A] 10/28/2015  . Severe episode of recurrent major depressive disorder, without psychotic features ([place]) [F33.2]   . Acute respiratory failure with hypoxemia ([place]) [J96.01]   . Bipolar I disorder, most recent episode mixed ([place]) [F31.60] 06/14/2015  . Essential hypertension [I10]   . Sinus bradycardia [R00.1] 06/10/2015  . Prolonged QT interval [R94.31] 05/03/2015  . Morbid obesity due to excess calories ([place]) [E66.01]   . GERD (gastroesophageal reflux disease) [K21.9]   . Migraine without aura and without status migrainosus, not intractable [G43.009]   . Overdose [T50.901A] 03/23/2015  . History of hepatitis C virus infection [Z86.19] 02/26/2015  . 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 Right facial pain [R51] 01/05/2015  . Polypharmacy [Z79.899] 01/18/2012    Class: Chronic  . Borderline personality disorder [F60.3] 01/18/2012  . Hypothyroidism [E03.9] 09/16/2011   History of Present Illness: This is an admission assessment for this Caucasian female known to this hospital from previous hospitalization. She has hx of Bipolar disorder as well as intentional suicide attempts by cutting & overdose. She is being admitted to this hospital this time for complaints of worsening suicidal ideations. Her BAL on admission was 112 & UDS positive for Benzodiazepine as well THC. However, she denies any alcohol consumption or  drug use. During this assessment, Brittany [name] reports, "I have Trigeminal neuralgia on the left side of my face. I also has pain to my right arm. I'm in pain all the time & it stresses me out. The stress between my fiance & me is too much. I have to pay for everything we use in the house, do all the chores. He does not help me with any chores or the financial responsibility. I got frustrated yesterday & I felt like I will be better off not be here any more. I did not attempt suicide, but I had in the past by cutting & overdose on a bunch of tylenol pills. My first suicide attempt was just to get attention from my parents. I was diagnosed with bipolar disorder, major depression & chronic anxiety".  Objective: Patient is wearing a right fore-arm brace. There is a wheel chair in her room to use on as needed basis due to complaints of lower extremity weakness. She has pending legal charge for disorderly conduct.    Associated Signs/Symptoms:  Depression Symptoms:  depressed mood, suicidal thoughts without plan, anxiety,  (Hypo) Manic Symptoms:  Impulsivity, Irritable Mood, Labiality of Mood,  Anxiety Symptoms:  Excessive Worry,  Psychotic Symptoms:  Denies any hallucinations, delusional thoughts or paranoia.  PTSD Symptoms: Denies any PTSD symptoms or events.  Total Time spent with patient: 1 hour  Past Psychiatric History: Bipolar affective disorder.  Is the patient at risk to self? Yes.    Has the patient been a risk to self in the past 6 months? No.  Has the patient been a risk to self within the distant past? Yes.    Is the patient a risk to others? No.  Has the  patient been a risk to others in the past 6 months? No.  Has the patient been a risk to others within the distant past? No.   Prior Inpatient Therapy: Yes Prior Outpatient Therapy: Yes  Alcohol Screening: 1. How often do you have a drink containing alcohol?: Never 9. Have you or someone else been injured as a result of your  drinking?: No 10. Has a relative or friend or a doctor or another health worker been concerned about your drinking or suggested you cut down?: No Alcohol Use Disorder Identification Test Final Score (AUDIT): 0 Brief Intervention: AUDIT score less than 7 or less-screening does not suggest unhealthy drinking-brief intervention not indicated  Substance Abuse History in the last 12 months:  Yes.    Consequences of Substance Abuse: Medical Consequences:  Liver damage, Possible death by overdose Legal Consequences:  Arrests, jail time, Loss of driving privilege. Family Consequences:  Family discord, divorce and or separation.  Previous Psychotropic Medications: Yes   Psychological Evaluations: No   Past Medical History:  Past Medical History:  Diagnosis Date  . Anxiety   . Bipolar 1 disorder ([place])   . Bulging lumbar disc   . Chronic bronchitis ([place])   . Chronic lower back pain   . Depression   . Facial nerve injury   . Facial pain 11/2013   walking dog when she tripped and "faceplanted" landing on her face.   Marland Kitchen GERD (gastroesophageal reflux disease)   . Goiter   . Head injury   . Heart murmur   . Hyperlipidemia   . Hypertension   . Hypothyroidism   . Migraine    "weekly" (03/23/2015)  . Ruptured intervertebral disc   . Seizures ([place])    "I've had a few; had a grand mal when I took Neurotin; still take Neurotin" (03/23/2015)  . Short-term memory loss     Past Surgical History:  Procedure Laterality Date  . ABDOMINAL HYSTERECTOMY    . CESAREAN SECTION  2004  . EYE SURGERY Right 11/2013   emergent lateral canthotomy due to proptosis  Brittany [name] 11/30/2013  . TONSILLECTOMY     Family History:  Family History  Problem Relation Age of Onset  . Migraines Mother   . Migraines Father   . Other Father   . Migraines Sister    Family Psychiatric  History:   Tobacco Screening: Have you used any form of tobacco in the last 30 days? (Cigarettes, Smokeless Tobacco, Cigars, and/or  Pipes): Yes Tobacco use, Select all that apply: 5 or more cigarettes per day Are you interested in Tobacco Cessation Medications?: No, patient refused Counseled patient on smoking cessation including recognizing danger situations, developing coping skills and basic information about quitting provided: Refused/Declined practical counseling Social History:  History  Alcohol Use No    Comment: 03/23/2015 "might drink at Christmastime"     History  Drug Use  . Types: Marijuana    Comment: 03/23/2015 ""haven't smoked marijuana for years"    Additional Social History:  Allergies:   Allergies  Allergen Reactions  . Toradol [Ketorolac Tromethamine] Swelling and Other (See Comments)    Arms and stomach swelled  . Suboxone [Buprenorphine Hcl-Naloxone Hcl] Other (See Comments)    Reaction:  Cellulitis   . Subutex [Buprenorphine] Rash  . Trazodone And Nefazodone Other (See Comments)    Reaction:  Bladder infection   Lab Results:  Results for orders placed or performed during the hospital encounter of 05/14/16 (from the past 48 hour(s))  Rapid urine  drug screen (hospital performed)     Status: Abnormal   Collection Time: 05/14/16  9:00 PM  Result Value Ref Range   Opiates NONE DETECTED NONE DETECTED   Cocaine NONE DETECTED NONE DETECTED   Benzodiazepines POSITIVE (A) NONE DETECTED   Amphetamines NONE DETECTED NONE DETECTED   Tetrahydrocannabinol POSITIVE (A) NONE DETECTED   Barbiturates NONE DETECTED NONE DETECTED    Comment:        DRUG SCREEN FOR MEDICAL PURPOSES ONLY.  IF CONFIRMATION IS NEEDED FOR ANY PURPOSE, NOTIFY LAB WITHIN 5 DAYS.        LOWEST DETECTABLE LIMITS FOR URINE DRUG SCREEN Drug Class       Cutoff (ng/mL) Amphetamine      1000 Barbiturate      200 Benzodiazepine   161 Tricyclics       096 Opiates          300 Cocaine          300 THC              50   Comprehensive metabolic panel     Status: None   Collection Time: 05/14/16  9:16 PM  Result Value Ref Range    Sodium 141 135 - 145 mmol/L   Potassium 3.6 3.5 - 5.1 mmol/L   Chloride 105 101 - 111 mmol/L   CO2 25 22 - 32 mmol/L   Glucose, Bld 83 65 - 99 mg/dL   BUN 10 6 - 20 mg/dL   Creatinine, Ser 0.96 0.44 - 1.00 mg/dL   Calcium 9.4 8.9 - 10.3 mg/dL   Total Protein 7.2 6.5 - 8.1 g/dL   Albumin 3.9 3.5 - 5.0 g/dL   AST 27 15 - 41 U/L   ALT 23 14 - 54 U/L   Alkaline Phosphatase 75 38 - 126 U/L   Total Bilirubin 0.7 0.3 - 1.2 mg/dL   GFR calc non Af Amer >60 >60 mL/min   GFR calc Af Amer >60 >60 mL/min    Comment: (NOTE) The eGFR has been calculated using the CKD EPI equation. This calculation has not been validated in all clinical situations. eGFR's persistently <60 mL/min signify possible Chronic Kidney Disease.    Anion gap 11 5 - 15  Ethanol     Status: Abnormal   Collection Time: 05/14/16  9:16 PM  Result Value Ref Range   Alcohol, Ethyl (B) 112 (H) <5 mg/dL    Comment:        LOWEST DETECTABLE LIMIT FOR SERUM ALCOHOL IS 5 mg/dL FOR MEDICAL PURPOSES ONLY   Salicylate level     Status: None   Collection Time: 05/14/16  9:16 PM  Result Value Ref Range   Salicylate Lvl <0.4 2.8 - 30.0 mg/dL  Acetaminophen level     Status: Abnormal   Collection Time: 05/14/16  9:16 PM  Result Value Ref Range   Acetaminophen (Tylenol), Serum <10 (L) 10 - 30 ug/mL    Comment:        THERAPEUTIC CONCENTRATIONS VARY SIGNIFICANTLY. A RANGE OF 10-30 ug/mL MAY BE AN EFFECTIVE CONCENTRATION FOR MANY PATIENTS. HOWEVER, SOME ARE BEST TREATED AT CONCENTRATIONS OUTSIDE THIS RANGE. ACETAMINOPHEN CONCENTRATIONS >150 ug/mL AT 4 HOURS AFTER INGESTION AND >50 ug/mL AT 12 HOURS AFTER INGESTION ARE OFTEN ASSOCIATED WITH TOXIC REACTIONS.   cbc     Status: Abnormal   Collection Time: 05/14/16  9:16 PM  Result Value Ref Range   WBC 10.8 (H) 4.0 - 10.5 K/uL   RBC  3.50 (L) 3.87 - 5.11 MIL/uL   Hemoglobin 10.5 (L) 12.0 - 15.0 g/dL   HCT 32.6 (L) 36.0 - 46.0 %   MCV 93.1 78.0 - 100.0 fL   MCH 30.0  26.0 - 34.0 pg   MCHC 32.2 30.0 - 36.0 g/dL   RDW 14.0 11.5 - 15.5 %   Platelets 268 150 - 400 K/uL   Blood Alcohol level:  Lab Results  Component Value Date   ETH 112 (H) 05/14/2016   ETH <5 08/13/1599   Metabolic Disorder Labs:  Lab Results  Component Value Date   HGBA1C 5.1 05/01/2015   MPG 100 05/01/2015   Lab Results  Component Value Date   PROLACTIN 69.9 (H) 05/01/2015   Lab Results  Component Value Date   CHOL 199 04/30/2015   TRIG 386 (H) 08/02/2015   HDL 41 04/30/2015   CHOLHDL 4.9 04/30/2015   VLDL 53 (H) 04/30/2015   LDLCALC 105 (H) 04/30/2015    Current Medications: Current Facility-Administered Medications  Medication Dose Route Frequency Provider Last Rate Last Dose  . acetaminophen (TYLENOL) tablet 650 mg  650 mg Oral Q6H PRN Jenne Campus, MD      . alum & mag hydroxide-simeth (MAALOX/MYLANTA) 200-200-20 MG/5ML suspension 30 mL  30 mL Oral Q4H PRN Laverle Hobby, PA-C      . butalbital-acetaminophen-caffeine (FIORICET, ESGIC) [phone] MG per tablet 1 tablet  1 tablet Oral Q4H PRN Jenne Campus, MD   1 tablet at 05/16/16 [phone]  . DULoxetine (CYMBALTA) DR capsule 60 mg  60 mg Oral BID Jenne Campus, MD   60 mg at 05/16/16 0820  . feeding supplement (ENSURE ENLIVE) (ENSURE ENLIVE) liquid 237 mL  237 mL Oral BID BM Fernando A Cobos, MD      . gabapentin (NEURONTIN) capsule 400 mg  400 mg Oral BID Jenne Campus, MD   400 mg at 05/16/16 0820  . ibuprofen (ADVIL,MOTRIN) tablet 600 mg  600 mg Oral Q6H PRN Jenne Campus, MD   600 mg at 05/16/16 0042  . lamoTRIgine (LAMICTAL) tablet 100 mg  100 mg Oral BID Jenne Campus, MD   100 mg at 05/16/16 0820  . magnesium hydroxide (MILK OF MAGNESIA) suspension 30 mL  30 mL Oral Daily PRN Laverle Hobby, PA-C      . [START ON 05/17/2016] pneumococcal 23 valent vaccine (PNU-IMMUNE) injection 0.5 mL  0.5 mL Intramuscular Tomorrow-1000 Myer Peer Cobos, MD      . topiramate (TOPAMAX) tablet 50 mg  50 mg Oral BID  Jenne Campus, MD   50 mg at 05/16/16 0254   PTA Medications: Prescriptions Prior to Admission  Medication Sig Dispense Refill Last Dose  . butalbital-acetaminophen-caffeine (FIORICET, ESGIC) 50-325-40 MG tablet Take 1 tablet by mouth every 4 (four) hours as needed for headache or migraine.   week ago  . DULoxetine (CYMBALTA) 60 MG capsule Take 1 capsule (60 mg total) by mouth 2 (two) times daily. 60 capsule 0 05/14/2016 at am  . gabapentin (NEURONTIN) 400 MG capsule Take 1 capsule (400 mg total) by mouth 3 (three) times daily. (Patient taking differently: Take 400 mg by mouth 2 (two) times daily. ) 30 capsule 0 05/14/2016 at am  . ibuprofen (ADVIL,MOTRIN) 200 MG tablet Take 200 mg by mouth every 6 (six) hours as needed for headache (pain).   unknown  . lamoTRIgine (LAMICTAL) 100 MG tablet Take 1 tablet (100 mg total) by mouth daily. (Patient taking differently: Take  100 mg by mouth 2 (two) times daily. ) 30 tablet 0 05/14/2016 at am  . LORazepam (ATIVAN) 0.5 MG tablet Take 0.5 mg by mouth 2 (two) times daily as needed for anxiety.    05/13/2016 at pm  . Multiple Vitamin (MULTIVITAMIN WITH MINERALS) TABS tablet Take 1 tablet by mouth daily.    05/14/2016 at am  . naproxen sodium (ALEVE) 220 MG tablet Take 880-1,100 mg by mouth 2 (two) times daily as needed (pain).   week ago  . topiramate (TOPAMAX) 50 MG tablet Take 50 mg by mouth every 4 (four) hours as needed (nerve pain).    week ago  . traMADol (ULTRAM) 50 MG tablet Take 100 mg by mouth every 6 (six) hours as needed for moderate pain.    05/13/2016 at pm   Musculoskeletal: Strength & Muscle Tone: within normal limits Gait & Station: normal Patient leans: N/A  Psychiatric Specialty Exam: Physical Exam  Constitutional: She is oriented to person, place, and time. She appears well-developed.  HENT:  Head: Normocephalic.  Eyes: Pupils are equal, round, and reactive to light.  Neck: Normal range of motion.  Cardiovascular:  Elevated systolic  pressure.  Respiratory: Effort normal.  GI: Soft.  Genitourinary:  Genitourinary Comments: Deferred  Musculoskeletal: Normal range of motion.  Neurological: She is alert and oriented to person, place, and time.  Skin: Skin is warm and dry.    Review of Systems  Constitutional: Positive for malaise/fatigue.  HENT: Negative.   Eyes: Negative.   Respiratory: Negative.   Cardiovascular:       Elevated systolic pressure.  Gastrointestinal: Negative.   Genitourinary: Negative.   Musculoskeletal: Negative.   Skin: Negative.   Neurological: Negative.   [name]/Heme/Allergies: Negative.   Psychiatric/Behavioral: Positive for depression, substance abuse (BAL 112, UDS positive for Benzodiazepine/THC) and suicidal ideas. Negative for hallucinations and memory loss. The patient is nervous/anxious and has insomnia.     Blood pressure (!) 144/71, pulse (!) 59, temperature 97.1 F (36.2 C), temperature source Oral, resp. rate 16, height 5' 4"  (1.626 m), weight 93.9 kg (207 lb), SpO2 98 %.Body mass index is 35.53 kg/m.  General Appearance: Disheveled  Eye Contact:  Fair  Speech:  Clear and Coherent  Volume:  Normal  Mood:  Anxious and Depressed  Affect:  Flat  Thought Process:  Coherent  Orientation:  Full (Time, Place, and Person)  Thought Content:  Rumination  Suicidal Thoughts:  Currently denies any thoughts, plans or intent.  Homicidal Thoughts:  Denies  Memory:  Immediate;   Good Recent;   Good Remote;   Good  Judgement:  Good  Insight:  Fair  Psychomotor Activity:  Normal  Concentration:  Concentration: Good and Attention Span: Good  Recall:  Good  Fund of Knowledge:  Fair  Language:  Good  Akathisia:  Negative  Handed:  Right  AIMS (if indicated):     Assets:  Communication Skills Desire for Improvement  ADL's:  Intact  Cognition:  WNL  Sleep:  Number of Hours: 5   Treatment Plan/Recommendations: 1. Admit for crisis management and stabilization, estimated length of stay  3-5 days.  2. Medication management to reduce current symptoms to base line and improve the patient's overall level of functioning: Will initiate Tegretol 200 mg tid for trigeminal neuralgia & mood stabilization, Neurontin 600 mg qid for agitation/pain management & Cymbalta 60 mg bid for depression.  Will initiate Librium detox protocols for alcohol/benzodiazepine detox.  3. Treat health problems as indicated.  4.  Develop treatment plan to decrease risk of relapse upon discharge and the need for readmission.  5. Psycho-social education regarding relapse prevention and self care.  6. Health care follow up as needed for medical problems.  7. Review, reconcile, and reinstate any pertinent home medications for other health issues where appropriate. 8. Call for consults with hospitalist for any additional specialty patient care services as needed.  Observation Level/Precautions:  15 minute checks  Laboratory:  Per ED, BAL 112, UDS + for Benzodiazepine & THC  Psychotherapy: Group sessions   Medications: See MAR   Consultations: As needed   Discharge Concerns: Safety, mood stability  Estimated LOS: 2-4 days  Other: Admit to [facility]     Physician Treatment Plan for Primary Diagnosis: Will initiate medication management for mood stability. Set up an outpatient psychiatric services for medication management. Will encourage medication adherence with psychiatric medications.   Long Term Goal(s): Improvement in symptoms so as ready for discharge  Short Term Goals: Ability to identify changes in lifestyle to reduce recurrence of condition will improve, Ability to disclose and discuss suicidal ideas and Ability to demonstrate self-control will improve  Physician Treatment Plan for Secondary Diagnosis: Active Problems:   Bipolar affective disorder, depressed ([place])  Long Term Goal(s): Improvement in symptoms so as ready for discharge  Short Term Goals: Ability to identify and develop effective  coping behaviors will improve, Ability to maintain clinical measurements within normal limits will improve, Compliance with prescribed medications will improve and Ability to identify triggers associated with substance abuse/mental health issues will improve  I certify that inpatient services furnished can reasonably be expected to improve the patient's condition.    Encarnacion Slates, NP, PMHMP, FNP-BC 4/3/20189:49 AM

## 2016-05-16 NOTE — Social Work (Signed)
Referred to [facility], is [place] resident.  Brittany Salminen, LCSW Lead Clinical Social Worker Phone:  [phone]  

## 2016-05-16 NOTE — [facility] Group Notes (Signed)
[facility] LCSW Group Therapy  05/16/2016 1:43 PM  Type of Therapy:  Group Therapy  Participation Level:  Did Not Attend-pt invited. Chose to remain in bed.   Summary of Progress/Problems: [facility] Speaker came to talk about his personal journey with substance abuse and addiction. The pt processed ways by which to relate to the speaker. MHA speaker provided handouts and educational information pertaining to groups and services offered by the [facility].   Ernesha Ramone N Smart LCSW 05/16/2016, 1:43 PM

## 2016-05-16 NOTE — [facility] Group Notes (Signed)
[facility] Group Notes:  (Nursing/MHT/Case Management/Adjunct)  Date:  05/16/2016  Time:  1:33 PM  Type of Therapy:  Psychoeducational Skills  Participation Level:  Did Not Attend  Participation Quality:  DID NOT ATTEND  Affect:  DID NOT ATTEND  Cognitive:  DID NOT ATTEND  Insight:  None  Engagement in Group:  DID NOT ATTEND  Modes of Intervention:  DID NOT ATTEND  Summary of Progress/Problems: Pt did not attend patient self inventory group.   Bethann Punches 05/16/2016, 1:33 PM

## 2016-05-16 NOTE — [facility] Suicide Risk Assessment (Signed)
[facility] Admission Suicide Risk Assessment   Nursing information obtained from:    Demographic factors:    Current Mental Status:    Loss Factors:    Historical Factors:    Risk Reduction Factors:     Total Time spent with patient: 30 minutes Principal Problem: Bipolar I disorder, most recent episode depressed ([facility]) Diagnosis:   Patient Active Problem List   Diagnosis Date Noted  . Bipolar I disorder, most recent episode depressed ([facility]) [F31.30] 05/16/2016  . History of trigeminal neuralgia [Z86.69] 05/16/2016  . Benzodiazepine dependence ([facility]) [F13.20] 05/16/2016  . Alcohol use disorder, moderate, dependence ([facility]) [F10.20] 05/16/2016  . Positive hepatitis C antibody test [R76.8] 12/24/2015  . Bipolar 1 disorder ([facility]) [F31.9] 12/23/2015  . Trigeminal neuralgia [G50.0] 12/23/2015  . Overdose [T50.901A] 12/22/2015  . Baclofen overdose [T42.8X1A] 10/28/2015  . 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 Acute respiratory failure with hypoxemia ([facility]) [J96.01]   . Bipolar I disorder, most recent episode mixed ([facility]) [F31.60] 06/14/2015  . Essential hypertension [I10]   . Sinus bradycardia [R00.1] 06/10/2015  . Prolonged QT interval [R94.31] 05/03/2015  . Morbid obesity due to excess calories ([facility]) [E66.01]   . GERD (gastroesophageal reflux disease) [K21.9]   . Migraine without aura and without status migrainosus, not intractable [G43.009]   . Overdose [T50.901A] 03/23/2015  . History of hepatitis C virus infection [Z86.19] 02/26/2015  . 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 Right facial pain [R51] 01/05/2015  . Polypharmacy [Z79.899] 01/18/2012    Class: Chronic  . Borderline personality disorder [F60.3] 01/18/2012  . Hypothyroidism [E03.9] 09/16/2011   Subjective Data: Patient with bipolar do, presents depressed, withdrawn, suicidal, has substance abuse issues, chronic pain, trigeminal neuralgia- will restart her medications and  continue to observe. Restart Tegretol for mood as well as pain, cymbalta , CIWA protocol. Repeat EKG for hx of prolonged qtc.  Continued Clinical Symptoms:  Alcohol Use Disorder Identification Test Final Score (AUDIT): 0 The "Alcohol Use Disorders Identification Test", Guidelines for Use in Primary Care, Second Edition.  World Science writer [place]). Score between 0-7:  no or low risk or alcohol related problems. Score between 8-15:  moderate risk of alcohol related problems. Score between 16-19:  high risk of alcohol related problems. Score 20 or above:  warrants further diagnostic evaluation for alcohol dependence and treatment.   CLINICAL FACTORS:   Severe Anxiety and/or Agitation Depression:   Hopelessness Impulsivity Chronic Pain Unstable or Poor Therapeutic Relationship Previous Psychiatric Diagnoses and Treatments   Musculoskeletal: Strength & Muscle Tone: within normal limits Gait & Station: normal Patient leans: N/A  Psychiatric Specialty Exam: Physical Exam  Review of Systems  Psychiatric/Behavioral: Positive for depression, substance abuse and suicidal ideas. The patient is nervous/anxious.   All other systems reviewed and are negative.   Blood pressure (!) 144/71, pulse (!) 59, temperature 97.1 F (36.2 C), temperature source Oral, resp. rate 16, height  (1.626 m), weight 93.9 kg (207 lb), SpO2 98 %.Body mass index is 35.53 kg/m.  General Appearance: Casual  Eye Contact:  Fair  Speech:  Normal Rate  Volume:  Normal  Mood:  Anxious and Depressed  Affect:  Appropriate  Thought Process:  Goal Directed and Descriptions of Associations: Intact  Orientation:  Other:  to place, situation  Thought Content:  Rumination  Suicidal Thoughts:  Yes.  without intent/plan  Homicidal Thoughts:  No  Memory:  Immediate;   Fair Recent;   Fair Remote;  Fair  Judgement:  Impaired  Insight:  Shallow  Psychomotor Activity:  Decreased  Concentration:  Concentration:  Fair and Attention Span: Poor  Recall:  [employer] of Knowledge:  Fair  Language:  Fair  Akathisia:  No  Handed:  Right  AIMS (if indicated):     Assets:  Desire for Improvement  ADL's:  Intact  Cognition:  WNL  Sleep:  Number of Hours: 5      COGNITIVE FEATURES THAT CONTRIBUTE TO RISK:  Closed-mindedness, Polarized thinking and Thought constriction (tunnel vision)    SUICIDE RISK:   Moderate:  Frequent suicidal ideation with limited intensity, and duration, some specificity in terms of plans, no associated intent, good self-control, limited dysphoria/symptomatology, some risk factors present, and identifiable protective factors, including available and accessible social support.  PLAN OF CARE: Case discussed with NP - please see H&P. Will restart her medications .  I certify that inpatient services furnished can reasonably be expected to improve the patient's condition.   Britnie Colville, MD 05/16/2016, 2:30 PM

## 2016-05-16 NOTE — Plan of Care (Signed)
Problem: Education: Goal: Knowledge of  General Education information/materials will improve Outcome: Progressing Patient given information about the unit rules and regulations, as well as fall risk safety and proper use of wheelchair while on the unit. Patient verbalizes an understanding.
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
## 2016-05-16 NOTE — Progress Notes (Signed)
Admission Note: Patient admitted to unit for suicidal ideations with a plan to cut herself with a knife due to her chronic pain. Patient currently denies suicidal ideations or plans to harm herself, but states she needs pain medication at this time. Patient continuously asking for pain medications during the entirety of the admission process. Patient also requesting to have a doctor come to see her as soon as possible to get new orders. Patient drowsy and closing her eyes mid-sentence multiple times, but continued to complain of severe pain. Patient also stating "I fall all of the time for no reason. I was walking down the side walk a couple of days ago and I just collapsed. I can't walk that good and I have to wear an elbow brace for nerve pain and can't do a lot of things without help". Patient states "I can't function without pain medication, that's what I really need around the clock". During the admission process, the patient seemed as if she was too weak to walk by bending her knees as if to not be able to support her own weight, but when offered assistance with ambulating, pt immediately stood up straight and walked on her own. Patient appearing to be manipulative of staff during admission and was in need of assistance with getting dressed. Patient given a wheelchair to use for ambulation; safety instructions provided to avoid falls; pt agrees to use call light before attempting to ambulate or use wheelchair. Patient instructed to use wheelchair at all times due to fall risk, to which she verbally agreed. Patient assisted to her bed by Clinical research associate. No signs or symptoms of distress noted at this time.

## 2016-05-16 NOTE — Progress Notes (Signed)
Nutrition Brief Note  Patient identified on the Malnutrition Screening Tool (MST) Report  Pt with insignificant weight loss for time frame. Pt has been ordered ensure supplements. Was eating 50-100% of meals at [facility] ED.  Wt Readings from Last 15 Encounters:  05/15/16 207 lb (93.9 kg)  05/14/16 204 lb 6 oz (92.7 kg)  04/14/16 209 lb 3.2 oz (94.9 kg)  12/22/15 206 lb 9.1 oz (93.7 kg)  11/02/15 225 lb 8.5 oz (102.3 kg)  08/04/15 232 lb 9.6 oz (105.5 kg)  07/19/15 230 lb (104.3 kg)  06/22/15 232 lb 3.2 oz (105.3 kg)  06/14/15 236 lb (107 kg)  06/11/15 232 lb 9.4 oz (105.5 kg)  05/26/15 220 lb (99.8 kg)  04/27/15 244 lb (110.7 kg)  04/27/15 235 lb 3.2 oz (106.7 kg)  03/24/15 224 lb 13.9 oz (102 kg)  03/03/15 224 lb 6.9 oz (101.8 kg)    Body mass index is 35.53 kg/m. Patient meets criteria for obesity based on current BMI.   Labs and medications reviewed.   No nutrition interventions warranted at this time. If nutrition issues arise, please consult RD.   Tilda Franco, MS, RD, LDN Pager: [phone] After Hours Pager: [phone]

## 2016-05-16 NOTE — Progress Notes (Signed)
DAR NOTE: Patient presents with anxious affect and depressed mood.  Pt uses wheelchair  For safety . Stayed in the room most of the time. Complained of headache pain, denies auditory and visual hallucinations.  Rates depression at 5, hopelessness at 2, and anxiety at 6.  Maintained on routine safety checks.  Medications given as prescribed. Support and encouragement offered as needed. Offered no complaint, will continue to monitor.

## 2016-05-16 NOTE — Tx Team (Signed)
Interdisciplinary Treatment and Diagnostic Plan Update  05/16/2016 Time of Session: 0930 Brittany [name] MRN: [id number]  Principal Diagnosis: MDD  Secondary Diagnoses: Active Problems:   Bipolar affective disorder, depressed (HCC)   Current Medications:  Current Facility-Administered Medications  Medication Dose Route Frequency Provider Last Rate Last Dose  . acetaminophen (TYLENOL) tablet 650 mg  650 mg Oral Q6H PRN Craige Cotta, MD      . alum & mag hydroxide-simeth (MAALOX/MYLANTA) 200-200-20 MG/5ML suspension 30 mL  30 mL Oral Q4H PRN Kerry Hough, PA-C      . butalbital-acetaminophen-caffeine (FIORICET, ESGIC) [phone] MG per tablet 1 tablet  1 tablet Oral Q4H PRN Craige Cotta, MD   1 tablet at 05/16/16 [phone]  . DULoxetine (CYMBALTA) DR capsule 60 mg  60 mg Oral BID Craige Cotta, MD   60 mg at 05/16/16 0820  . feeding supplement (ENSURE ENLIVE) (ENSURE ENLIVE) liquid 237 mL  237 mL Oral BID BM Rockey Situ Cobos, MD   237 mL at 05/16/16 1017  . gabapentin (NEURONTIN) capsule 400 mg  400 mg Oral BID Craige Cotta, MD   400 mg at 05/16/16 0820  . ibuprofen (ADVIL,MOTRIN) tablet 600 mg  600 mg Oral Q6H PRN Craige Cotta, MD   600 mg at 05/16/16 0042  . lamoTRIgine (LAMICTAL) tablet 100 mg  100 mg Oral BID Craige Cotta, MD   100 mg at 05/16/16 0820  . magnesium hydroxide (MILK OF MAGNESIA) suspension 30 mL  30 mL Oral Daily PRN Kerry Hough, PA-C      . [START ON 05/17/2016] pneumococcal 23 valent vaccine (PNU-IMMUNE) injection 0.5 mL  0.5 mL Intramuscular Tomorrow-1000 Rockey Situ Cobos, MD      . topiramate (TOPAMAX) tablet 50 mg  50 mg Oral BID Craige Cotta, MD   50 mg at 05/16/16 2956   PTA Medications: Prescriptions Prior to Admission  Medication Sig Dispense Refill Last Dose  . butalbital-acetaminophen-caffeine (FIORICET, ESGIC) 50-325-40 MG tablet Take 1 tablet by mouth every 4 (four) hours as needed for headache or migraine.   week ago  . DULoxetine  (CYMBALTA) 60 MG capsule Take 1 capsule (60 mg total) by mouth 2 (two) times daily. 60 capsule 0 05/14/2016 at am  . gabapentin (NEURONTIN) 400 MG capsule Take 1 capsule (400 mg total) by mouth 3 (three) times daily. (Patient taking differently: Take 400 mg by mouth 2 (two) times daily. ) 30 capsule 0 05/14/2016 at am  . ibuprofen (ADVIL,MOTRIN) 200 MG tablet Take 200 mg by mouth every 6 (six) hours as needed for headache (pain).   unknown  . lamoTRIgine (LAMICTAL) 100 MG tablet Take 1 tablet (100 mg total) by mouth daily. (Patient taking differently: Take 100 mg by mouth 2 (two) times daily. ) 30 tablet 0 05/14/2016 at am  . LORazepam (ATIVAN) 0.5 MG tablet Take 0.5 mg by mouth 2 (two) times daily as needed for anxiety.    05/13/2016 at pm  . Multiple Vitamin (MULTIVITAMIN WITH MINERALS) TABS tablet Take 1 tablet by mouth daily.    05/14/2016 at am  . naproxen sodium (ALEVE) 220 MG tablet Take 880-1,100 mg by mouth 2 (two) times daily as needed (pain).   week ago  . topiramate (TOPAMAX) 50 MG tablet Take 50 mg by mouth every 4 (four) hours as needed (nerve pain).    week ago  . traMADol (ULTRAM) 50 MG tablet Take 100 mg by mouth every 6 (six) hours as needed for  moderate pain.    05/13/2016 at pm    Patient Stressors:    Patient Strengths:    Treatment Modalities: Medication Management, Group therapy, Case management,  1 to 1 session with clinician, Psychoeducation, Recreational therapy.   Physician Treatment Plan for Primary Diagnosis: MDD Long Term Goal(s): Improvement in symptoms so as ready for discharge Improvement in symptoms so as ready for discharge   Short Term Goals: Ability to identify changes in lifestyle to reduce recurrence of condition will improve Ability to disclose and discuss suicidal ideas Ability to demonstrate self-control will improve Ability to identify and develop effective coping behaviors will improve Ability to maintain clinical measurements within normal limits will  improve Compliance with prescribed medications will improve Ability to identify triggers associated with substance abuse/mental health issues will improve  Medication Management: Evaluate patient's response, side effects, and tolerance of medication regimen.  Therapeutic Interventions: 1 to 1 sessions, Unit Group sessions and Medication administration.  Evaluation of Outcomes: Progressing  Physician Treatment Plan for Secondary Diagnosis: Active Problems:   Bipolar affective disorder, depressed (HCC)  Long Term Goal(s): Improvement in symptoms so as ready for discharge Improvement in symptoms so as ready for discharge   Short Term Goals: Ability to identify changes in lifestyle to reduce recurrence of condition will improve Ability to disclose and discuss suicidal ideas Ability to demonstrate self-control will improve Ability to identify and develop effective coping behaviors will improve Ability to maintain clinical measurements within normal limits will improve Compliance with prescribed medications will improve Ability to identify triggers associated with substance abuse/mental health issues will improve     Medication Management: Evaluate patient's response, side effects, and tolerance of medication regimen.  Therapeutic Interventions: 1 to 1 sessions, Unit Group sessions and Medication administration.  Evaluation of Outcomes: Progressing   RN Treatment Plan for Primary Diagnosis: MDD Long Term Goal(s): Knowledge of disease and therapeutic regimen to maintain health will improve  Short Term Goals: Ability to remain free from injury will improve, Ability to verbalize feelings will improve and Ability to disclose and discuss suicidal ideas  Medication Management: RN will administer medications as ordered by provider, will assess and evaluate patient's response and provide education to patient for prescribed medication. RN will report any adverse and/or side effects to  prescribing provider.  Therapeutic Interventions: 1 on 1 counseling sessions, Psychoeducation, Medication administration, Evaluate responses to treatment, Monitor vital signs and CBGs as ordered, Perform/monitor CIWA, COWS, AIMS and Fall Risk screenings as ordered, Perform wound care treatments as ordered.  Evaluation of Outcomes: Progressing   LCSW Treatment Plan for Primary Diagnosis: MDD Long Term Goal(s): Safe transition to appropriate next level of care at discharge, Engage patient in therapeutic group addressing interpersonal concerns.  Short Term Goals: Engage patient in aftercare planning with referrals and resources, Facilitate patient progression through stages of change regarding substance use diagnoses and concerns and Identify triggers associated with mental health/substance abuse issues  Therapeutic Interventions: Assess for all discharge needs, 1 to 1 time with Social worker, Explore available resources and support systems, Assess for adequacy in community support network, Educate family and significant other(s) on suicide prevention, Complete Psychosocial Assessment, Interpersonal group therapy.  Evaluation of Outcomes: Progressing   Progress in Treatment: Attending groups: No. New to unit. Continuing to assess.  Participating in groups: No. Taking medication as prescribed: Yes. Toleration medication: Yes. Family/Significant other contact made: No, will contact:  family member or significant other if patient consents Patient understands diagnosis: Yes. Discussing patient identified problems/goals with  staff: Yes. Medical problems stabilized or resolved: Yes. Denies suicidal/homicidal ideation: No, passive SI. Able to contract for safety on the unit.  Issues/concerns per patient self-inventory: No. Other: n/a   New problem(s) identified: No, Describe:  n/a  New Short Term/Long Term Goal(s): detox; medication stabilization; chronic pain issues.   Discharge Plan or  Barriers: CSW assessing for apprpriate referrals. Psychiatrist, Dolores Frame, and therapist Ms. Harris.  Reason for Continuation of Hospitalization: Depression Medication stabilization Withdrawal symptoms Other; describe chronic pain issues  SI  Estimated Length of Stay: 3-5 days   Attendees: Patient: 05/16/2016 10:47 AM  Physician: Dr. Elna Breslow MD 05/16/2016 10:47 AM  Nursing: Louis Matte RN 05/16/2016 10:47 AM  RN Care Manager: Juliann Pares 05/16/2016 10:47 AM  Social Worker: Trula Slade, LCSW 05/16/2016 10:47 AM  Recreational Therapist: Juliann Pares 05/16/2016 10:47 AM  Other: May Augustin NP 05/16/2016 10:47 AM  Other:  05/16/2016 10:47 AM  Other: 05/16/2016 10:47 AM    Scribe for Treatment Team: Ledell Peoples Smart, LCSW 05/16/2016 10:47 AM

## 2016-05-17 DIAGNOSIS — F1290 Cannabis use, unspecified, uncomplicated: Secondary | ICD-10-CM

## 2016-05-17 DIAGNOSIS — G4700 Insomnia, unspecified: Secondary | ICD-10-CM

## 2016-05-17 DIAGNOSIS — F1320 Sedative, hypnotic or anxiolytic dependence, uncomplicated: Secondary | ICD-10-CM

## 2016-05-17 DIAGNOSIS — Z818 Family history of other mental and behavioral disorders: Secondary | ICD-10-CM

## 2016-05-17 DIAGNOSIS — F1020 Alcohol dependence, uncomplicated: Secondary | ICD-10-CM

## 2016-05-17 DIAGNOSIS — F17210 Nicotine dependence, cigarettes, uncomplicated: Secondary | ICD-10-CM

## 2016-05-17 MED ORDER — IBUPROFEN 800 MG PO TABS
800.0000 mg | ORAL_TABLET | Freq: Four times a day (QID) | ORAL | Status: DC | PRN
  Administered 2016-05-17 – 2016-05-19 (×5): 800 mg via ORAL
  Filled 2016-05-17 (×4): qty 1

## 2016-05-17 MED ORDER — METHOCARBAMOL 500 MG PO TABS
500.0000 mg | ORAL_TABLET | Freq: Three times a day (TID) | ORAL | Status: AC
  Administered 2016-05-17 – 2016-05-19 (×6): 500 mg via ORAL
  Filled 2016-05-17 (×8): qty 1

## 2016-05-17 NOTE — Progress Notes (Signed)
Adult Psychoeducational Group Note  Date:  05/17/2016 Time:  1:13 AM  Group Topic/Focus:  Wrap-Up Group:   The focus of this group is to help patients review their daily goal of treatment and discuss progress on daily workbooks.  Participation Level:  Active  Participation Quality:  Appropriate, Attentive and Sharing  Affect:  Appropriate and Labile  Cognitive:  Appropriate  Insight: Improving  Engagement in Group:  Developing/Improving  Modes of Intervention:  Discussion, Socialization and Support  Additional Comments:  Pt attend wrap up group and discussed she is trying to be positive even though she is in physical pain. Stated she was able to get out of bed and walk around she is grateful for that.  Karleen Hampshire Brittini 05/17/2016, 1:13 AM

## 2016-05-17 NOTE — Progress Notes (Signed)
Recreation Therapy Notes  Date: 05/17/16 Time: 0930 Location: 300 Hall Dayroom  Group Topic: Stress Management  Goal Area(s) Addresses:  Patient will verbalize importance of using healthy stress management.  Patient will identify positive emotions associated with healthy stress management.   Intervention: Stress Management  Activity :  Progressive Muscle Relaxation.  LRT introduced the stress management technique of progressive muscle relaxation.  LRT read a script to allow patients to tense and relax each muscle group individually.  Patients were to follow along as LRT read script to engage in activity.  Education:  Stress Management, Discharge Planning.   Education Outcome: Acknowledges edcuation/In group clarification offered/Needs additional education  Clinical Observations/Feedback: Pt did not attend group.   [name], LRT/CTRS         Quinta Eimer A 05/17/2016 12:18 PM 

## 2016-05-17 NOTE — Progress Notes (Signed)
[facility] MD Progress Note  05/17/2016 3:11 PM Brittany [name]  MRN:  [id number]  Subjective: Brittany [name] reports, "Noise bothers me, but I'm okay mentally, I just don't feel like being around people today. That is why I'm in my room".  Objective: Brittany [name] is seen, chart reviewed. She is alert, oriented x 3 & aware of situation. She is dressed in street clothes. She has her right fore-arm brace intact. She says her mental health is improving. However, she is complaining that her mouth & her arm hurts. She says she can't take a bath unless she had Tramadol. Patient has been explained that other alternative pain measures will be taken to manage her pain complaints instead of Tramadol. She is currently on Neurontin 600 mg & Ibuprofen & 800 mg prn for pain management. She has an order for Robaxin 500 mg tid prn x 2 days. She also says noise bothers her as a result not attending group & trying very hard to avoid people. She is however encouraged to go to the cafeteria to eat meals. She currently denies any SIHI, AVH, delusional thoughts or paranoia. She says she is mentally okay. Brittany [name] says she is going to see a neurologist after discharge. She is not in any apparent distress.  Principal Problem: Bipolar I disorder, most recent episode depressed (HCC)  Diagnosis:   Patient Active Problem List   Diagnosis Date Noted  . Bipolar I disorder, most recent episode depressed (HCC) [F31.30] 05/16/2016  . History of trigeminal neuralgia [Z86.69] 05/16/2016  . Benzodiazepine dependence (HCC) [F13.20] 05/16/2016  . Alcohol use disorder, moderate, dependence (HCC) [F10.20] 05/16/2016  . Positive hepatitis C antibody test [R76.8] 12/24/2015  . Bipolar 1 disorder (HCC) [F31.9] 12/23/2015  . Trigeminal neuralgia [G50.0] 12/23/2015  . Overdose [T50.901A] 12/22/2015  . Baclofen overdose [T42.8X1A] 10/28/2015  . 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 Acute  respiratory failure with hypoxemia (HCC) [J96.01]   . Bipolar I disorder, most recent episode mixed (HCC) [F31.60] 06/14/2015  . Essential hypertension [I10]   . Sinus bradycardia [R00.1] 06/10/2015  . Prolonged QT interval [R94.31] 05/03/2015  . Morbid obesity due to excess calories (HCC) [E66.01]   . GERD (gastroesophageal reflux disease) [K21.9]   . Migraine without aura and without status migrainosus, not intractable [G43.009]   . Overdose [T50.901A] 03/23/2015  . History of hepatitis C virus infection [Z86.19] 02/26/2015  . 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 Right facial pain [R51] 01/05/2015  . Polypharmacy [Z79.899] 01/18/2012    Class: Chronic  . Borderline personality disorder [F60.3] 01/18/2012  . Hypothyroidism [E03.9] 09/16/2011   Total Time spent with patient: 25 minutes  Past Psychiatric History: Polysubstance dependence  Past Medical History:  Past Medical History:  Diagnosis Date  . Anxiety   . Bipolar 1 disorder (HCC)   . Bulging lumbar disc   . Chronic bronchitis (HCC)   . Chronic lower back pain   . Depression   . Facial nerve injury   . Facial pain 11/2013   walking dog when she tripped and "faceplanted" landing on her face.   Marland Kitchen GERD (gastroesophageal reflux disease)   . Goiter   . Head injury   . Heart murmur   . Hyperlipidemia   . Hypertension   . Hypothyroidism   . Migraine    "weekly" (03/23/2015)  . Ruptured intervertebral disc   . Seizures (HCC)    "I've had a few; had a grand mal when I took Neurotin;  still take Neurotin" (03/23/2015)  . Short-term memory loss     Past Surgical History:  Procedure Laterality Date  . ABDOMINAL HYSTERECTOMY    . CESAREAN SECTION  2004  . EYE SURGERY Right 11/2013   emergent lateral canthotomy due to proptosis  [name] 11/30/2013  . TONSILLECTOMY     Family History:  Family History  Problem Relation Age of Onset  . Migraines Mother   . Bipolar disorder Mother   . Migraines Father   . Other Father   .  Parkinson's disease Father   . Migraines Sister    Family Psychiatric  History: See H&P Social History:  History  Alcohol Use No    Comment: 03/23/2015 "might drink at Christmastime"     History  Drug Use  . Types: Marijuana    Comment: 03/23/2015 ""haven't smoked marijuana for years"    Social History   Social History  . Marital status: Single    Spouse name: N/A  . Number of children: 1  . Years of education: 6   Occupational History  . Unemployed    Social History Main Topics  . Smoking status: Current Every Day Smoker  . Smokeless tobacco: Never Used  . Alcohol use No     Comment: 03/23/2015 "might drink at Christmastime"  . Drug use: Yes    Types: Marijuana     Comment: 03/23/2015 ""haven't smoked marijuana for years"  . Sexual activity: Yes    Birth control/ protection: Condom   Other Topics Concern  . None   Social History Narrative   ** Merged History Encounter **       Lives at home with her fiancee. Right-handed. No caffeine use.   Additional Social History:   Sleep: Fair  Appetite:  Good  Current Medications: Current Facility-Administered Medications  Medication Dose Route Frequency Provider Last Rate Last Dose  . acetaminophen (TYLENOL) tablet 650 mg  650 mg Oral Q6H PRN Craige Cotta, MD      . alum & mag hydroxide-simeth (MAALOX/MYLANTA) 200-200-20 MG/5ML suspension 30 mL  30 mL Oral Q4H PRN Kerry Hough, PA-C      . butalbital-acetaminophen-caffeine (FIORICET, ESGIC) [phone] MG per tablet 1 tablet  1 tablet Oral Q4H PRN Craige Cotta, MD   1 tablet at 05/16/16 [phone]  . carbamazepine (TEGRETOL) chewable tablet 200 mg  200 mg Oral TID Sanjuana Kava, NP      . chlordiazePOXIDE (LIBRIUM) capsule 25 mg  25 mg Oral Q6H PRN Sanjuana Kava, NP      . chlordiazePOXIDE (LIBRIUM) capsule 25 mg  25 mg Oral QID Sanjuana Kava, NP   25 mg at 05/17/16 1302   Followed by  . [START ON 05/18/2016] chlordiazePOXIDE (LIBRIUM) capsule 25 mg  25 mg Oral TID Sanjuana Kava, NP       Followed by  . [START ON 05/19/2016] chlordiazePOXIDE (LIBRIUM) capsule 25 mg  25 mg Oral BH-qamhs Sanjuana Kava, NP       Followed by  . [START ON 05/20/2016] chlordiazePOXIDE (LIBRIUM) capsule 25 mg  25 mg Oral Daily Sanjuana Kava, NP      . DULoxetine (CYMBALTA) DR capsule 60 mg  60 mg Oral BID Craige Cotta, MD   60 mg at 05/17/16 0855  . feeding supplement (ENSURE ENLIVE) (ENSURE ENLIVE) liquid 237 mL  237 mL Oral BID BM Rockey Situ Cobos, MD   237 mL at 05/16/16 1513  . gabapentin (NEURONTIN) capsule 600  mg  600 mg Oral TID PC & HS Sanjuana Kava, NP   600 mg at 05/17/16 1302  . hydrOXYzine (ATARAX/VISTARIL) tablet 25 mg  25 mg Oral Q4H PRN Sanjuana Kava, NP      . ibuprofen (ADVIL,MOTRIN) tablet 600 mg  600 mg Oral Q6H PRN Craige Cotta, MD   600 mg at 05/17/16 1411  . magnesium hydroxide (MILK OF MAGNESIA) suspension 30 mL  30 mL Oral Daily PRN Kerry Hough, PA-C      . mirtazapine (REMERON SOL-TAB) disintegrating tablet 15 mg  15 mg Oral QHS,MR X 1 Spencer E Simon, PA-C   15 mg at 05/16/16 2238  . multivitamin with minerals tablet 1 tablet  1 tablet Oral Daily Sanjuana Kava, NP   1 tablet at 05/17/16 0854  . pneumococcal 23 valent vaccine (PNU-IMMUNE) injection 0.5 mL  0.5 mL Intramuscular Tomorrow-1000 Fernando A Cobos, MD      . thiamine (VITAMIN B-1) tablet 100 mg  100 mg Oral Daily Sanjuana Kava, NP   100 mg at 05/17/16 0856  . topiramate (TOPAMAX) tablet 50 mg  50 mg Oral BID Craige Cotta, MD   50 mg at 05/17/16 1610    Lab Results: No results found for this or any previous visit (from the past 48 hour(s)).  Blood Alcohol level:  Lab Results  Component Value Date   ETH 112 (H) 05/14/2016   ETH <5 12/22/2015   Metabolic Disorder Labs: Lab Results  Component Value Date   HGBA1C 5.1 05/01/2015   MPG 100 05/01/2015   Lab Results  Component Value Date   PROLACTIN 69.9 (H) 05/01/2015   Lab Results  Component Value Date   CHOL 199 04/30/2015    TRIG 386 (H) 08/02/2015   HDL 41 04/30/2015   CHOLHDL 4.9 04/30/2015   VLDL 53 (H) 04/30/2015   LDLCALC 105 (H) 04/30/2015   Physical Findings: AIMS: Facial and Oral Movements Muscles of Facial Expression: None, normal Lips and Perioral Area: None, normal Jaw: None, normal Tongue: None, normal,Extremity Movements Upper (arms, wrists, hands, fingers): None, normal Lower (legs, knees, ankles, toes): None, normal, Trunk Movements Neck, shoulders, hips: None, normal, Overall Severity Severity of abnormal movements (highest score from questions above): None, normal Incapacitation due to abnormal movements: None, normal Patient's awareness of abnormal movements (rate only patient's report): No Awareness, Dental Status Current problems with teeth and/or dentures?: No Does patient usually wear dentures?: No  CIWA:  CIWA-Ar Total: 1 COWS:     Musculoskeletal: Strength & Muscle Tone: within normal limits Gait & Station: normal Patient leans: N/A  Psychiatric Specialty Exam: Physical Exam  Review of Systems  Genitourinary: Positive for dysuria.  Psychiatric/Behavioral: Positive for depression ("Improving") and substance abuse (Hx. polysubstance dependence). Negative for hallucinations, memory loss and suicidal ideas. The patient is nervous/anxious and has insomnia.     Blood pressure 118/71, pulse 61, temperature 97.9 F (36.6 C), temperature source Oral, resp. rate 16, height  (1.626 m), weight 93.9 kg (207 lb), SpO2 98 %.Body mass index is 35.53 kg/m.  General Appearance: Casual  Eye Contact:  Good  Speech:  Clear and Coherent and Normal Rate  Volume:  Normal  Mood:  "Improving"  Affect:  Appropriate and Congruent  Thought Process:  Coherent  Orientation:  Full (Time, Place, and Person)  Thought Content:  Rumination  Suicidal Thoughts:  Denies  Homicidal Thoughts:  Denies  Memory:  Immediate;   Good Recent;   Good  Remote;   Good  Judgement:  Fair  Insight:  Lacking   Psychomotor Activity:  Normal  Concentration:  Concentration: Good and Attention Span: Good  Recall:  Good  Fund of Knowledge:  Fair  Language:  Good  Akathisia:  No  Handed:  Right  AIMS (if indicated):     Assets:  Communication Skills Desire for Improvement  ADL's:  Intact  Cognition:  WNL  Sleep:  Number of Hours: 5   Treatment Plan Summary: the stressors. Daily contact with patient to assess and evaluate symptoms and progress in treatment and Medication management  Reviewed past medical records & treatment plan.   For Depressed mood:  Will continue Cymbalta 60 mg po bid. Will continue Mirtazapine 15 mg Q hs.  For Anxiety/agitation: Will continue Gabapentin 600 mg  mg po QID.  For insomnia: Will continue Mirtazapine 15 mg po qhs.  For mood stabilization/trigeminal neuralgia: Will continue the Tegretol cheweable 200 mg tid. Will continue Topamax 50 mg bid.  Other medical issues & concerns: Will continue to monitor & treat PRN such as pain complaints.  - Continue 15 minutes observation for safety concerns - Encouraged to participate in milieu therapy and group therapy counseling sessions and also work with coping skills -  Develop treatment plan to reduce the need for readmission. -  Psycho-social education regarding self care. - Health care follow up as needed for medical problems. - Restart home medications where appropriate.  Sanjuana Kava, NP, PMHNP, FNP-BC 05/17/2016, 3:11 PM

## 2016-05-17 NOTE — [facility] Group Notes (Signed)
[facility] LCSW Group Therapy  05/17/2016 2:58 PM  Type of Therapy:  Group Therapy  Participation Level:  Active  Participation Quality:  Attentive  Affect:  Appropriate  Cognitive:  Alert and Oriented  Insight:  Improving  Engagement in Therapy:  Improving  Modes of Intervention:  Discussion, Education, Exploration, Problem-solving, Socialization and Support  Summary of Progress/Problems:  Finding Balance in Life. Today's group focused on defining balance in one's own words, identifying things that can knock one off balance, and exploring healthy ways to maintain balance in life. Group members were asked to provide an example of a time when they felt off balance, describe how they handled that situation,and process healthier ways to regain balance in the future. Group members were asked to share the most important tool for maintaining balance that they learned while at [facility] and how they plan to apply this method after discharge.   Binnie Droessler N Smart LCSW 05/17/2016, 2:58 PM

## 2016-05-17 NOTE — Progress Notes (Signed)
D: Pt has mht retrieve Clinical research associate to inform of her desire to take her medications earlier than scheduled. Writer informed pt that meds could be given a 2100. Pt explained that she would like to have "at least one tramadol" for her arm.  Stated, nerve pain extended to her elbow. Writer informed pt that she was scheduled to have nuerontin at 2100 which may help with the pain. Pt continued to explain her need for the tramadol. Writer informed that on call extender would be contacted. Later in evening pt asked writer for sleep medication. Extender wrote order for remeron.   A:  Support and encouragement was offered. 15 min checks continued for safety.  R: Pt remains safe.

## 2016-05-18 DIAGNOSIS — Z8669 Personal history of other diseases of the nervous system and sense organs: Secondary | ICD-10-CM

## 2016-05-18 MED ORDER — CARBAMAZEPINE 200 MG PO TABS
200.0000 mg | ORAL_TABLET | Freq: Three times a day (TID) | ORAL | Status: DC
  Filled 2016-05-18 (×6): qty 1

## 2016-05-18 MED ORDER — BACITRACIN ZINC 500 UNIT/GM EX OINT
TOPICAL_OINTMENT | Freq: Two times a day (BID) | CUTANEOUS | Status: DC
  Administered 2016-05-19: 09:00:00 via TOPICAL
  Filled 2016-05-18: qty 28.35

## 2016-05-18 NOTE — Progress Notes (Addendum)
[facility] MD Progress Note  05/18/2016 11:32 AM [name]  MRN:  [id number]  Subjective: patient reports history of chronic pain, states she has history of trigeminal neuralgia and of ulnar nerve damage . She reports prior history of cellulitis and reports concern she may develop cellulitis again. Presents medication focused and in particular requesting Ultram for pain.   Objective:  I have discussed case with treatment team and have met with patient. She has been diagnosed with Bipolar Disorder in the past, reports chronic pain, as above. Presented to ED with suicidal ideations related to pain.   At this time patient presents vaguely anxious, somatically focused, reporting chronic pain and reporting fear of developing cellulitis- has a healing, scabbed lesion on L calf area- no surrounding erythema, no drainage, no visible surrounding inflammation, no fever, no chills . She reports history of good response to Ultram for her chronic pain and is focused on medication issues. At this time denies suicidal ideations, and is future oriented, states she plans to go to pain specialist after discharge in order to discuss novel treatment modalities for her trigeminal neuralgia. No disruptive or agitated behaviors on unit . Going to some groups. Of note, has declined Tegretol , reporting allergy  Denies medication side effects. Of note, patient's history includes history of QTc prolongation-  4/3 EKG reported as NSR with QTc of 466, 11/9 EKG reported as NSR with QTc of 469.   Principal Problem: Bipolar I disorder, most recent episode depressed ([place])  Diagnosis:   Patient Active Problem List   Diagnosis Date Noted  . Bipolar I disorder, most recent episode depressed ([place]) [F31.30] 05/16/2016  . History of trigeminal neuralgia [Z86.69] 05/16/2016  . Benzodiazepine dependence ([place]) [F13.20] 05/16/2016  . Alcohol use disorder, moderate, dependence ([place]) [F10.20] 05/16/2016  . Positive hepatitis C  antibody test [R76.8] 12/24/2015  . Bipolar 1 disorder ([place]) [F31.9] 12/23/2015  . Trigeminal neuralgia [G50.0] 12/23/2015  . Overdose [T50.901A] 12/22/2015  . Baclofen overdose [T42.8X1A] 10/28/2015  . Severe episode of recurrent major depressive disorder, without psychotic features ([place]) [F33.2]   . Acute respiratory failure with hypoxemia ([place]) [J96.01]   . Bipolar I disorder, most recent episode mixed ([place]) [F31.60] 06/14/2015  . Essential hypertension [I10]   . Sinus bradycardia [R00.1] 06/10/2015  . Prolonged QT interval [R94.31] 05/03/2015  . Morbid obesity due to excess calories ([place]) [E66.01]   . GERD (gastroesophageal reflux disease) [K21.9]   . Migraine without aura and without status migrainosus, not intractable [G43.009]   . Overdose [T50.901A] 03/23/2015  . History of hepatitis C virus infection [Z86.19] 02/26/2015  . 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 Right facial pain [R51] 01/05/2015  . Polypharmacy [Z79.899] 01/18/2012    Class: Chronic  . Borderline personality disorder [F60.3] 01/18/2012  . Hypothyroidism [E03.9] 09/16/2011   Total Time spent with patient: 20 minutes  Past Psychiatric History: Polysubstance dependence  Past Medical History:  Past Medical History:  Diagnosis Date  . Anxiety   . Bipolar 1 disorder ([place])   . Bulging lumbar disc   . Chronic bronchitis ([place])   . Chronic lower back pain   . Depression   . Facial nerve injury   . Facial pain 11/2013   walking dog when she tripped and "faceplanted" landing on her face.   Marland Kitchen GERD (gastroesophageal reflux disease)   . Goiter   . Head injury   . Heart murmur   . Hyperlipidemia   . Hypertension   . Hypothyroidism   .  Migraine    "weekly" (03/23/2015)  . Ruptured intervertebral disc   . Seizures ([place])    "I've had a few; had a grand mal when I took Neurotin; still take Neurotin" (03/23/2015)  . Short-term memory loss     Past Surgical History:  Procedure Laterality Date  . ABDOMINAL  HYSTERECTOMY    . CESAREAN SECTION  2004  . EYE SURGERY Right 11/2013   emergent lateral canthotomy due to proptosis  Archie Endo 11/30/2013  . TONSILLECTOMY     Family History:  Family History  Problem Relation Age of Onset  . Migraines Mother   . Bipolar disorder Mother   . Migraines Father   . Other Father   . Parkinson's disease Father   . Migraines Sister    Family Psychiatric  History: See H&P Social History:  History  Alcohol Use No    Comment: 03/23/2015 "might drink at Christmastime"     History  Drug Use  . Types: Marijuana    Comment: 03/23/2015 ""haven't smoked marijuana for years"    Social History   Social History  . Marital status: Single    Spouse name: N/A  . Number of children: 1  . Years of education: 43   Occupational History  . Unemployed    Social History Main Topics  . Smoking status: Current Every Day Smoker  . Smokeless tobacco: Never Used  . Alcohol use No     Comment: 03/23/2015 "might drink at Christmastime"  . Drug use: Yes    Types: Marijuana     Comment: 03/23/2015 ""haven't smoked marijuana for years"  . Sexual activity: Yes    Birth control/ protection: Condom   Other Topics Concern  . None   Social History Narrative   ** Merged History Encounter **       Lives at home with her fiancee. Right-handed. No caffeine use.   Additional Social History:   Sleep: improved   Appetite:  Fair  Current Medications: Current Facility-Administered Medications  Medication Dose Route Frequency Provider Last Rate Last Dose  . acetaminophen (TYLENOL) tablet 650 mg  650 mg Oral Q6H PRN Jenne Campus, MD      . alum & mag hydroxide-simeth (MAALOX/MYLANTA) 200-200-20 MG/5ML suspension 30 mL  30 mL Oral Q4H PRN Laverle Hobby, PA-C      . butalbital-acetaminophen-caffeine (FIORICET, ESGIC) [phone] MG per tablet 1 tablet  1 tablet Oral Q4H PRN Jenne Campus, MD   1 tablet at 05/16/16 [phone]  . carbamazepine (TEGRETOL) chewable tablet 200 mg   200 mg Oral TID Encarnacion Slates, NP   200 mg at 05/18/16 0904  . chlordiazePOXIDE (LIBRIUM) capsule 25 mg  25 mg Oral Q6H PRN Encarnacion Slates, NP      . chlordiazePOXIDE (LIBRIUM) capsule 25 mg  25 mg Oral TID Encarnacion Slates, NP   25 mg at 05/18/16 0800   Followed by  . [START ON 05/19/2016] chlordiazePOXIDE (LIBRIUM) capsule 25 mg  25 mg Oral BH-qamhs Encarnacion Slates, NP      . DULoxetine (CYMBALTA) DR capsule 60 mg  60 mg Oral BID Jenne Campus, MD   60 mg at 05/18/16 0906  . feeding supplement (ENSURE ENLIVE) (ENSURE ENLIVE) liquid 237 mL  237 mL Oral BID BM Myer Peer Mayleigh Tetrault, MD   237 mL at 05/16/16 1513  . gabapentin (NEURONTIN) capsule 600 mg  600 mg Oral TID PC & HS Encarnacion Slates, NP   600 mg at 05/18/16  0947  . hydrOXYzine (ATARAX/VISTARIL) tablet 25 mg  25 mg Oral Q4H PRN Encarnacion Slates, NP      . ibuprofen (ADVIL,MOTRIN) tablet 800 mg  800 mg Oral Q6H PRN Encarnacion Slates, NP   800 mg at 05/17/16 2113  . magnesium hydroxide (MILK OF MAGNESIA) suspension 30 mL  30 mL Oral Daily PRN Laverle Hobby, PA-C      . methocarbamol (ROBAXIN) tablet 500 mg  500 mg Oral TID Encarnacion Slates, NP   500 mg at 05/18/16 0904  . mirtazapine (REMERON SOL-TAB) disintegrating tablet 15 mg  15 mg Oral QHS,MR X 1 Laverle Hobby, PA-C   15 mg at 05/17/16 2214  . multivitamin with minerals tablet 1 tablet  1 tablet Oral Daily Encarnacion Slates, NP   1 tablet at 05/18/16 0800  . thiamine (VITAMIN B-1) tablet 100 mg  100 mg Oral Daily Encarnacion Slates, NP   100 mg at 05/18/16 0905  . topiramate (TOPAMAX) tablet 50 mg  50 mg Oral BID Jenne Campus, MD   50 mg at 05/18/16 0962    Lab Results: No results found for this or any previous visit (from the past 48 hour(s)).  Blood Alcohol level:  Lab Results  Component Value Date   ETH 112 (H) 05/14/2016   ETH <5 83/66/2947   Metabolic Disorder Labs: Lab Results  Component Value Date   HGBA1C 5.1 05/01/2015   MPG 100 05/01/2015   Lab Results  Component Value Date    PROLACTIN 69.9 (H) 05/01/2015   Lab Results  Component Value Date   CHOL 199 04/30/2015   TRIG 386 (H) 08/02/2015   HDL 41 04/30/2015   CHOLHDL 4.9 04/30/2015   VLDL 53 (H) 04/30/2015   LDLCALC 105 (H) 04/30/2015   Physical Findings: AIMS: Facial and Oral Movements Muscles of Facial Expression: None, normal Lips and Perioral Area: None, normal Jaw: None, normal Tongue: None, normal,Extremity Movements Upper (arms, wrists, hands, fingers): None, normal Lower (legs, knees, ankles, toes): None, normal, Trunk Movements Neck, shoulders, hips: None, normal, Overall Severity Severity of abnormal movements (highest score from questions above): None, normal Incapacitation due to abnormal movements: None, normal Patient's awareness of abnormal movements (rate only patient's report): No Awareness, Dental Status Current problems with teeth and/or dentures?: No Does patient usually wear dentures?: No  CIWA:  CIWA-Ar Total: 3 COWS:     Musculoskeletal: Strength & Muscle Tone: within normal limits Gait & Station: normal Patient leans: N/A  Psychiatric Specialty Exam: Physical Exam  Review of Systems  Genitourinary: Positive for dysuria.  Psychiatric/Behavioral: Positive for depression ("Improving") and substance abuse (Hx. polysubstance dependence). Negative for hallucinations, memory loss and suicidal ideas. The patient is nervous/anxious and has insomnia.   reports chronic pain -affecting face and R forearm, hand  Blood pressure 122/77, pulse 75, temperature 97.7 F (36.5 C), temperature source Oral, resp. rate 16, height _0  (1.626 m), weight 93.9 kg (207 lb), SpO2 98 %.Body mass index is 35.53 kg/m.  General Appearance: Fairly Groomed  Eye Contact:  Good  Speech:  Normal Rate  Volume:  Normal  Mood:  states she is feeling  " a little bit better"  Affect:  anxious   Thought Process:  Linear and Descriptions of Associations: Intact  Orientation:  Other:  fully alert and  attentive   Thought Content:  Rumination ( about pain, medications ). Denies hallucinations, no delusions are expressed   Suicidal Thoughts:  Denies denies any  current suicidal or self injurious ideations, denies homicidal or violent ideations , contracts for safety on unit   Homicidal Thoughts:  Denies  Memory:  Recent and remote grossly intact   Judgement:  Fair  Insight:  Fair  Psychomotor Activity:  Normal  Concentration:  Concentration: Good and Attention Span: Good  Recall:  Good  Fund of Knowledge:  Good  Language:  Good  Akathisia:  NA  Handed:  Right  AIMS (if indicated):     Assets:  Communication Skills Desire for Improvement Resilience  ADL's:  Intact  Cognition:  WNL  Sleep:  Number of Hours: 6.25   Assessment - patient presents in no acute distress, but reports chronic pain related to trigeminal neuralgia and ulnar nerve damage. Presents vaguely anxious, but acknowledges feeling better than on admission and denies SI. Focused on medication issues, and stating Ultram has been effective in the past .   Treatment Plan Summary:  Plan reviewed as below today 4/5  Daily contact with patient to assess and evaluate symptoms and progress in treatment and Medication management Encourage group and milieu participation to work on coping skills and symptom reduction  Reviewed past medical records & treatment plan.   For Depressed mood:  Will continue Cymbalta 60 mg po bid. D/C Remeron to minimize risk of increased sedation/ simplify medication regimen  For Anxiety/agitation: Will continue Gabapentin 600 mg  mg po qid    For mood stabilization/trigeminal neuralgia: D/C Tegretol as patient reports allergy history to it  We discussed concerns about Ultram related to potential drug drug interactions and increased risk of Serotonin Syndrome as on Cymbalta .   Other medical issues & concerns:  I have reviewed skin lesion/patient concern about emerging cellulitis with NP , RN,  and hospitalist consultant- at this time it is not felt patient has symptoms of cellulitis- will order Bacitracin topically and continue to monitor   As patient not presenting with symptoms of BZD or alcohol WDL , will D/C standing Libirium detox protocol ( Continue PRNs for potential WDL if needed ) - rationale is to decrease risk of excessive sedation, drug drug interactions .  Treatment team working on disposition options     Jenne Campus, MD 05/18/2016, 11:32 AM   Patient ID: Brittany [name], female   DOB: May 03, 1965, 51 y.o.   MRN: [id number]

## 2016-05-18 NOTE — Progress Notes (Signed)
Patient ID: Brittany [name], female   DOB: [birth year], 51 y.o.   MRN: [id number] D  ---  Pt is refusing her Tegretol , saying she has an allergy to it which cause facial rash and swelling

## 2016-05-18 NOTE — [facility] Group Notes (Signed)
[facility] LCSW Group Therapy  05/18/2016 3:14 PM  Type of Therapy:  Group Therapy  Participation Level:  Active  Participation Quality:  Attentive  Affect:  Appropriate  Cognitive:  Alert and Oriented  Insight:  Improving  Engagement in Therapy:  Improving  Modes of Intervention:  Confrontation, Discussion, Education, Problem-solving, Socialization and Support  Summary of Progress/Problems: Feelings around Relapse. Group members discussed the meaning of relapse and shared personal stories of relapse, how it affected them and others, and how they perceived themselves during this time. Group members were encouraged to identify triggers, warning signs and coping skills used when facing the possibility of relapse. Social supports were discussed and explored in detail. Post Acute Withdrawal Syndrome (handout provided) was introduced and examined. Pt's were encouraged to ask questions, talk about key points associated with PAWS, and process this information in terms of relapse prevention.   Brittany [name] N Smart LCSW 05/18/2016, 3:14 PM

## 2016-05-18 NOTE — Progress Notes (Signed)
Nursing Progress Note 7p-7a  D) Patient presents anxious and irritable. Patient states "they changed all of medicines today but didn't tell me". Patient states "I am having bad withdrawals and I am too anxious to sleep. I need something, I am crawling out of my skin".  Patient is isolative to room this shift and did not attend karaoke. Patient denies SI/HI/AVH but endorses chronic pain to her right hand and requests pain medication. Patient contracts for safety on the unit. Patient states "I really want to go home".  A) Emotional support given. 1:1 interaction and active listening provided. Patient medicated with orders as prescribed. Medications and regimen reviewed with patient. Patient verbalized understanding of medications without further questions. Snacks and fluids provided. Opportunities for questions or concerns presented to patient. Patient encouraged to continue to work on treatment goals. Labs, vital signs and patient behavior monitored throughout shift. Patient safety maintained with q15 min safety checks.  R) Patient receptive to interaction with nurse. Patient remains safe on the unit at this time. Patient denies any adverse medication reactions at this time. Will continue to monitor.

## 2016-05-18 NOTE — Plan of Care (Signed)
Problem: Safety: Goal: Periods of time without injury will increase Outcome: Progressing Patient is on Level 3 q 15 minute safety checks. Patient contracts for safety on the unit. Fall safety precautions reviewed with patient.   

## 2016-05-18 NOTE — Progress Notes (Signed)
Pt has been up and visible in the milieu.  She attended evening AA group, but when it was finished, she came to writer upset that during the meeting it was discussed that people could go to doctors and get whatever medications that they wanted.  She did not think that was right.  No other patients mentioned this to staff.  Pt began telling writer what medications she needed tonight.  Writer informed pt of what meds were available to her.  She wanted the Robaxin, but Clinical research associate explained that it was a scheduled medication and she would receive it in the morning.  Pt was visibly irritated, but did accept that she could get Motrin prn at this time.  Pt spent some time talking about her meds.  She received her two doses of Remeron before going to bed.  Pt denies SI/HI/AVH.  Her only complaint was her chronic R hand and mouth pain.  Support and encouragement offered.  Discharge plans are in process.  Pt is cooperative and redirectable.  Safety maintained with q15 minute checks.

## 2016-05-18 NOTE — Progress Notes (Signed)
Patient ID: Brittany [name], female   DOB: 26-May-1965, 51 y.o.   MRN: [id number] D  ---  Pt agrees to contract for safety.  She states chronic pain in her jaw and right arm.  She is pleasant and friendly with good eye contact.  She interacts well with peers and attends groups.  She has refuses Tegretol today saying it causes facial swelling and rash.  She continues to have withdrawal issues of anxiety and takes meds with no sign of adverse effect.  --- A --- provide support  ---  R --   Pt safe on unit

## 2016-05-18 NOTE — Tx Team (Signed)
Initial Treatment Plan 05/18/2016 1:33 AM Brittany [name] ZOX:[id number]    PATIENT STRESSORS: Financial difficulties Health problems Marital or family conflict Medication change or noncompliance Substance abuse   PATIENT STRENGTHS: Average or above average intelligence Capable of independent living General fund of knowledge   PATIENT IDENTIFIED PROBLEMS: Depression  Chronic pain  Risk for self harm      "I need to see a doctor for medications for my pain"           DISCHARGE CRITERIA:  Ability to meet basic life and health needs Improved stabilization in mood, thinking, and/or behavior Motivation to continue treatment in a less acute level of care Need for constant or close observation no longer present Verbal commitment to aftercare and medication compliance Withdrawal symptoms are absent or subacute and managed without 24-hour nursing intervention  PRELIMINARY DISCHARGE PLAN: Attend aftercare/continuing care group Outpatient therapy Return to previous living arrangement  PATIENT/FAMILY INVOLVEMENT: This treatment plan has been presented to and reviewed with the patient, Brittany [name], and/or family member.  The patient and family have been given the opportunity to ask questions and make suggestions.  Charlott Holler, RN 05/18/2016, 1:33 AM

## 2016-05-19 MED ORDER — GABAPENTIN 400 MG PO CAPS: 800.0000 mg | ORAL_CAPSULE | Freq: Every day | ORAL | Status: DC

## 2016-05-19 MED ORDER — CHLORDIAZEPOXIDE HCL 25 MG PO CAPS
25.0000 mg | ORAL_CAPSULE | Freq: Four times a day (QID) | ORAL | Status: DC | PRN
  Administered 2016-05-19 – 2016-05-20 (×3): 25 mg via ORAL
  Filled 2016-05-19 (×3): qty 1

## 2016-05-19 MED ORDER — GABAPENTIN 400 MG PO CAPS
800.0000 mg | ORAL_CAPSULE | Freq: Three times a day (TID) | ORAL | Status: DC
  Administered 2016-05-19: 800 mg via ORAL
  Filled 2016-05-19 (×9): qty 2

## 2016-05-19 MED ORDER — GABAPENTIN 400 MG PO CAPS
800.0000 mg | ORAL_CAPSULE | Freq: Every day | ORAL | Status: DC
  Administered 2016-05-19: 800 mg via ORAL
  Filled 2016-05-19 (×3): qty 2

## 2016-05-19 MED ORDER — CHLORDIAZEPOXIDE HCL 25 MG PO CAPS: 25.0000 mg | ORAL_CAPSULE | Freq: Four times a day (QID) | ORAL | Status: DC | PRN

## 2016-05-19 MED ORDER — GABAPENTIN 400 MG PO CAPS
800.0000 mg | ORAL_CAPSULE | Freq: Three times a day (TID) | ORAL | Status: DC
  Administered 2016-05-19 – 2016-05-20 (×2): 800 mg via ORAL
  Filled 2016-05-19 (×5): qty 2

## 2016-05-19 NOTE — Tx Team (Signed)
Interdisciplinary Treatment and Diagnostic Plan Update  05/19/2016 Time of Session: 0930 Brittany [name] MRN: [id number]  Principal Diagnosis: MDD  Secondary Diagnoses: Principal Problem:   Bipolar I disorder, most recent episode depressed (HCC) Active Problems:   History of trigeminal neuralgia   Benzodiazepine dependence (HCC)   Alcohol use disorder, moderate, dependence (HCC)   Current Medications:  Current Facility-Administered Medications  Medication Dose Route Frequency Provider Last Rate Last Dose  . acetaminophen (TYLENOL) tablet 650 mg  650 mg Oral Q6H PRN Craige Cotta, MD   650 mg at 05/19/16 1031  . alum & mag hydroxide-simeth (MAALOX/MYLANTA) 200-200-20 MG/5ML suspension 30 mL  30 mL Oral Q4H PRN Kerry Hough, PA-C      . bacitracin ointment   Topical BID Craige Cotta, MD      . butalbital-acetaminophen-caffeine (FIORICET, ESGIC) [phone] MG per tablet 1 tablet  1 tablet Oral Q4H PRN Craige Cotta, MD   1 tablet at 05/19/16 0911  . chlordiazePOXIDE (LIBRIUM) capsule 25 mg  25 mg Oral Q6H PRN Sanjuana Kava, NP   25 mg at 05/18/16 2239  . DULoxetine (CYMBALTA) DR capsule 60 mg  60 mg Oral BID Craige Cotta, MD   60 mg at 05/19/16 0905  . feeding supplement (ENSURE ENLIVE) (ENSURE ENLIVE) liquid 237 mL  237 mL Oral BID BM Rockey Situ Cobos, MD   237 mL at 05/19/16 1000  . gabapentin (NEURONTIN) capsule 600 mg  600 mg Oral TID PC & HS Sanjuana Kava, NP   600 mg at 05/19/16 0905  . hydrOXYzine (ATARAX/VISTARIL) tablet 25 mg  25 mg Oral Q4H PRN Sanjuana Kava, NP   25 mg at 05/19/16 0403  . ibuprofen (ADVIL,MOTRIN) tablet 800 mg  800 mg Oral Q6H PRN Sanjuana Kava, NP   800 mg at 05/18/16 2239  . magnesium hydroxide (MILK OF MAGNESIA) suspension 30 mL  30 mL Oral Daily PRN Kerry Hough, PA-C      . methocarbamol (ROBAXIN) tablet 500 mg  500 mg Oral TID Sanjuana Kava, NP   500 mg at 05/19/16 0905  . multivitamin with minerals tablet 1 tablet  1 tablet Oral Daily  Sanjuana Kava, NP   1 tablet at 05/19/16 0905  . thiamine (VITAMIN B-1) tablet 100 mg  100 mg Oral Daily Sanjuana Kava, NP   100 mg at 05/19/16 0905  . topiramate (TOPAMAX) tablet 50 mg  50 mg Oral BID Craige Cotta, MD   50 mg at 05/19/16 4985   PTA Medications: Prescriptions Prior to Admission  Medication Sig Dispense Refill Last Dose  . butalbital-acetaminophen-caffeine (FIORICET, ESGIC) 50-325-40 MG tablet Take 1 tablet by mouth every 4 (four) hours as needed for headache or migraine.   week ago  . DULoxetine (CYMBALTA) 60 MG capsule Take 1 capsule (60 mg total) by mouth 2 (two) times daily. 60 capsule 0 05/14/2016 at am  . gabapentin (NEURONTIN) 400 MG capsule Take 1 capsule (400 mg total) by mouth 3 (three) times daily. (Patient taking differently: Take 400 mg by mouth 2 (two) times daily. ) 30 capsule 0 05/14/2016 at am  . ibuprofen (ADVIL,MOTRIN) 200 MG tablet Take 200 mg by mouth every 6 (six) hours as needed for headache (pain).   unknown  . lamoTRIgine (LAMICTAL) 100 MG tablet Take 1 tablet (100 mg total) by mouth daily. (Patient taking differently: Take 100 mg by mouth 2 (two) times daily. ) 30 tablet 0 05/14/2016 at  am  . LORazepam (ATIVAN) 0.5 MG tablet Take 0.5 mg by mouth 2 (two) times daily as needed for anxiety.    05/13/2016 at pm  . Multiple Vitamin (MULTIVITAMIN WITH MINERALS) TABS tablet Take 1 tablet by mouth daily.    05/14/2016 at am  . naproxen sodium (ALEVE) 220 MG tablet Take 880-1,100 mg by mouth 2 (two) times daily as needed (pain).   week ago  . topiramate (TOPAMAX) 50 MG tablet Take 50 mg by mouth every 4 (four) hours as needed (nerve pain).    week ago  . traMADol (ULTRAM) 50 MG tablet Take 100 mg by mouth every 6 (six) hours as needed for moderate pain.    05/13/2016 at pm    Patient Stressors: Financial difficulties Health problems Marital or family conflict Medication change or noncompliance Substance abuse  Patient Strengths: Average or above average  intelligence Capable of independent living General fund of knowledge  Treatment Modalities: Medication Management, Group therapy, Case management,  1 to 1 session with clinician, Psychoeducation, Recreational therapy.   Physician Treatment Plan for Primary Diagnosis: MDD Long Term Goal(s): Improvement in symptoms so as ready for discharge Improvement in symptoms so as ready for discharge   Short Term Goals: Ability to identify changes in lifestyle to reduce recurrence of condition will improve Ability to disclose and discuss suicidal ideas Ability to demonstrate self-control will improve Ability to identify and develop effective coping behaviors will improve Ability to maintain clinical measurements within normal limits will improve Compliance with prescribed medications will improve Ability to identify triggers associated with substance abuse/mental health issues will improve  Medication Management: Evaluate patient's response, side effects, and tolerance of medication regimen.  Therapeutic Interventions: 1 to 1 sessions, Unit Group sessions and Medication administration.  Evaluation of Outcomes: Met  Physician Treatment Plan for Secondary Diagnosis: Principal Problem:   Bipolar I disorder, most recent episode depressed ([place]) Active Problems:   History of trigeminal neuralgia   Benzodiazepine dependence ([place])   Alcohol use disorder, moderate, dependence ([place])  Long Term Goal(s): Improvement in symptoms so as ready for discharge Improvement in symptoms so as ready for discharge   Short Term Goals: Ability to identify changes in lifestyle to reduce recurrence of condition will improve Ability to disclose and discuss suicidal ideas Ability to demonstrate self-control will improve Ability to identify and develop effective coping behaviors will improve Ability to maintain clinical measurements within normal limits will improve Compliance with prescribed medications will  improve Ability to identify triggers associated with substance abuse/mental health issues will improve     Medication Management: Evaluate patient's response, side effects, and tolerance of medication regimen.  Therapeutic Interventions: 1 to 1 sessions, Unit Group sessions and Medication administration.  Evaluation of Outcomes: Met  RN Treatment Plan for Primary Diagnosis: MDD Long Term Goal(s): Knowledge of disease and therapeutic regimen to maintain health will improve  Short Term Goals: Ability to remain free from injury will improve, Ability to verbalize feelings will improve and Ability to disclose and discuss suicidal ideas  Medication Management: RN will administer medications as ordered by provider, will assess and evaluate patient's response and provide education to patient for prescribed medication. RN will report any adverse and/or side effects to prescribing provider.  Therapeutic Interventions: 1 on 1 counseling sessions, Psychoeducation, Medication administration, Evaluate responses to treatment, Monitor vital signs and CBGs as ordered, Perform/monitor CIWA, COWS, AIMS and Fall Risk screenings as ordered, Perform wound care treatments as ordered.  Evaluation of Outcomes: Met  LCSW  Treatment Plan for Primary Diagnosis: MDD Long Term Goal(s): Safe transition to appropriate next level of care at discharge, Engage patient in therapeutic group addressing interpersonal concerns.  Short Term Goals: Engage patient in aftercare planning with referrals and resources, Facilitate patient progression through stages of change regarding substance use diagnoses and concerns and Identify triggers associated with mental health/substance abuse issues  Therapeutic Interventions: Assess for all discharge needs, 1 to 1 time with Social worker, Explore available resources and support systems, Assess for adequacy in community support network, Educate family and significant other(s) on suicide  prevention, Complete Psychosocial Assessment, Interpersonal group therapy.  Evaluation of Outcomes: Met  Progress in Treatment: Attending groups: Yes, monopolizing at times.  Participating in groups: Yes Taking medication as prescribed: Yes. Toleration medication: Yes. Family/Significant other contact made: SPE completed with pt; pt declined to consent to family contact.  Patient understands diagnosis: Yes. Discussing patient identified problems/goals with staff: Yes. Medical problems stabilized or resolved: Yes. Denies suicidal/homicidal ideation: Yes, per self report.  Issues/concerns per patient self-inventory: No. Other: n/a   New problem(s) identified: No, Describe:  n/a  New Short Term/Long Term Goal(s): detox; medication stabilization; chronic pain issues.   Discharge Plan or Barriers: Pt plans to return home; follow-up at [facility]. [facility] pamphlet provided and [facility] list for [facility] provided to pt.  Reason for Continuation of Hospitalization: medication management   Estimated Length of Stay: 1 day (discharge scheduled for Saturday after lunch). Bus pass provided to pt.   Attendees: Patient: 05/19/2016 10:37 AM  Physician: Dr. Parke Poisson MD 05/19/2016 10:37 AM  Nursing: Kieth Brightly RN; Jinny Sanders RN 05/19/2016 10:37 AM  RN Care Manager: Lars Pinks CM 05/19/2016 10:37 AM  Social Worker: Maxie Better, LCSW 05/19/2016 10:37 AM  Recreational Therapist: Rhunette Croft 05/19/2016 10:37 AM  Other: Samuel Jester NP; Lindell Spar NP 05/19/2016 10:37 AM  Other:  05/19/2016 10:37 AM  Other: 05/19/2016 10:37 AM    Scribe for Treatment Team: Kimber Relic Smart, LCSW 05/19/2016 10:37 AM

## 2016-05-19 NOTE — Progress Notes (Signed)
At 1700 med pass, patient observed placing pills in hand and walking away attempting to place in pocket. Patient called back to window during which time she placed pills in her mouth but again attempted to walk away without allowing this writer to observe swallowing. Patient redirected at which time she did swallow pills. Mouth check completed. Patient replied when confronted with her actions, "oh I didn't know." Will continue to monitor closely.

## 2016-05-19 NOTE — Progress Notes (Signed)
[facility] MD Progress Note  05/19/2016 2:01 PM Brittany [name]  MRN:  [id number]  Subjective: patient reports feeling better than on admission. She continues to report chronic, persistent pain, affecting mostly R side of face, associated with trigeminal neuralgia, and of hand. She denies medication side effects, and does feel current medications are at least partially helpful. As she improves she is focusing more on discharge planning and is wanting to discharge soon. States she plans to go stay with a good friend for a period of time, as her SO is actively drinking .   Objective:  I have discussed case with treatment team and have met with patient. Patient presents improved at this time. Reports some ongoing depression, anxiety, which she attributes to chronic pain, but overall states she is feeling better than on admission and today presents future oriented, wanting to discuss disposition treatment plans . She reports history of opiate abuse in the past but not recently . Reports Ultram has helped before, but expresses understanding regarding its abuse potential and increased risk of drug drug interactions such as serotonin syndrome. She states current medications are helping, although partially. At this time presents calm, comfortable, in no acute distress or discomfort . Denies any suicidal ideations. No disruptive or agitated behaviors on unit. Principal Problem: Bipolar I disorder, most recent episode depressed ([place])  Diagnosis:   Patient Active Problem List   Diagnosis Date Noted  . Bipolar I disorder, most recent episode depressed ([place]) [F31.30] 05/16/2016  . History of trigeminal neuralgia [Z86.69] 05/16/2016  . Benzodiazepine dependence ([place]) [F13.20] 05/16/2016  . Alcohol use disorder, moderate, dependence ([place]) [F10.20] 05/16/2016  . Positive hepatitis C antibody test [R76.8] 12/24/2015  . Bipolar 1 disorder ([place]) [F31.9] 12/23/2015  . Trigeminal neuralgia [G50.0] 12/23/2015  .  Overdose [T50.901A] 12/22/2015  . Baclofen overdose [T42.8X1A] 10/28/2015  . Severe episode of recurrent major depressive disorder, without psychotic features ([place]) [F33.2]   . Acute respiratory failure with hypoxemia ([place]) [J96.01]   . Bipolar I disorder, most recent episode mixed ([place]) [F31.60] 06/14/2015  . Essential hypertension [I10]   . Sinus bradycardia [R00.1] 06/10/2015  . Prolonged QT interval [R94.31] 05/03/2015  . Morbid obesity due to excess calories ([place]) [E66.01]   . GERD (gastroesophageal reflux disease) [K21.9]   . Migraine without aura and without status migrainosus, not intractable [G43.009]   . Overdose [T50.901A] 03/23/2015  . History of hepatitis C virus infection [Z86.19] 02/26/2015  . 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 Right facial pain [R51] 01/05/2015  . Polypharmacy [Z79.899] 01/18/2012    Class: Chronic  . Borderline personality disorder [F60.3] 01/18/2012  . Hypothyroidism [E03.9] 09/16/2011   Total Time spent with patient: 20 minutes  Past Psychiatric History: Polysubstance dependence  Past Medical History:  Past Medical History:  Diagnosis Date  . Anxiety   . Bipolar 1 disorder ([place])   . Bulging lumbar disc   . Chronic bronchitis ([place])   . Chronic lower back pain   . Depression   . Facial nerve injury   . Facial pain 11/2013   walking dog when she tripped and "faceplanted" landing on her face.   Marland Kitchen GERD (gastroesophageal reflux disease)   . Goiter   . Head injury   . Heart murmur   . Hyperlipidemia   . Hypertension   . Hypothyroidism   . Migraine    "weekly" (03/23/2015)  . Ruptured intervertebral disc   . Seizures ([place])    "I've had a few; had a grand mal  when I took Neurotin; still take Neurotin" (03/23/2015)  . Short-term memory loss     Past Surgical History:  Procedure Laterality Date  . ABDOMINAL HYSTERECTOMY    . CESAREAN SECTION  2004  . EYE SURGERY Right 11/2013   emergent lateral canthotomy due to proptosis  Archie Endo 11/30/2013   . TONSILLECTOMY     Family History:  Family History  Problem Relation Age of Onset  . Migraines Mother   . Bipolar disorder Mother   . Migraines Father   . Other Father   . Parkinson's disease Father   . Migraines Sister    Family Psychiatric  History: See H&P Social History:  History  Alcohol Use No    Comment: 03/23/2015 "might drink at Christmastime"     History  Drug Use  . Types: Marijuana    Comment: 03/23/2015 ""haven't smoked marijuana for years"    Social History   Social History  . Marital status: Single    Spouse name: N/A  . Number of children: 1  . Years of education: 90   Occupational History  . Unemployed    Social History Main Topics  . Smoking status: Current Every Day Smoker  . Smokeless tobacco: Never Used  . Alcohol use No     Comment: 03/23/2015 "might drink at Christmastime"  . Drug use: Yes    Types: Marijuana     Comment: 03/23/2015 ""haven't smoked marijuana for years"  . Sexual activity: Yes    Birth control/ protection: Condom   Other Topics Concern  . None   Social History Narrative   ** Merged History Encounter **       Lives at home with her fiancee. Right-handed. No caffeine use.   Additional Social History:   Sleep: Fair  Appetite:  Fair  Current Medications: Current Facility-Administered Medications  Medication Dose Route Frequency Provider Last Rate Last Dose  . acetaminophen (TYLENOL) tablet 650 mg  650 mg Oral Q6H PRN Jenne Campus, MD   650 mg at 05/19/16 1031  . alum & mag hydroxide-simeth (MAALOX/MYLANTA) 200-200-20 MG/5ML suspension 30 mL  30 mL Oral Q4H PRN Laverle Hobby, PA-C      . bacitracin ointment   Topical BID Jenne Campus, MD      . butalbital-acetaminophen-caffeine (FIORICET, ESGIC) [phone] MG per tablet 1 tablet  1 tablet Oral Q4H PRN Jenne Campus, MD   1 tablet at 05/19/16 1312  . chlordiazePOXIDE (LIBRIUM) capsule 25 mg  25 mg Oral Q6H PRN Encarnacion Slates, NP   25 mg at 05/18/16 2239  .  DULoxetine (CYMBALTA) DR capsule 60 mg  60 mg Oral BID Jenne Campus, MD   60 mg at 05/19/16 0905  . feeding supplement (ENSURE ENLIVE) (ENSURE ENLIVE) liquid 237 mL  237 mL Oral BID BM Myer Peer Carder Yin, MD   237 mL at 05/19/16 1000  . gabapentin (NEURONTIN) capsule 800 mg  800 mg Oral TID PC & HS Encarnacion Slates, NP   800 mg at 05/19/16 1346  . hydrOXYzine (ATARAX/VISTARIL) tablet 25 mg  25 mg Oral Q4H PRN Encarnacion Slates, NP   25 mg at 05/19/16 1124  . ibuprofen (ADVIL,MOTRIN) tablet 800 mg  800 mg Oral Q6H PRN Encarnacion Slates, NP   800 mg at 05/19/16 1346  . magnesium hydroxide (MILK OF MAGNESIA) suspension 30 mL  30 mL Oral Daily PRN Laverle Hobby, PA-C      . multivitamin with minerals tablet 1  tablet  1 tablet Oral Daily Encarnacion Slates, NP   1 tablet at 05/19/16 0905  . thiamine (VITAMIN B-1) tablet 100 mg  100 mg Oral Daily Encarnacion Slates, NP   100 mg at 05/19/16 0905  . topiramate (TOPAMAX) tablet 50 mg  50 mg Oral BID Jenne Campus, MD   50 mg at 05/19/16 1610    Lab Results: No results found for this or any previous visit (from the past 48 hour(s)).  Blood Alcohol level:  Lab Results  Component Value Date   ETH 112 (H) 05/14/2016   ETH <5 96/05/5407   Metabolic Disorder Labs: Lab Results  Component Value Date   HGBA1C 5.1 05/01/2015   MPG 100 05/01/2015   Lab Results  Component Value Date   PROLACTIN 69.9 (H) 05/01/2015   Lab Results  Component Value Date   CHOL 199 04/30/2015   TRIG 386 (H) 08/02/2015   HDL 41 04/30/2015   CHOLHDL 4.9 04/30/2015   VLDL 53 (H) 04/30/2015   LDLCALC 105 (H) 04/30/2015   Physical Findings: AIMS: Facial and Oral Movements Muscles of Facial Expression: None, normal Lips and Perioral Area: None, normal Jaw: None, normal Tongue: None, normal,Extremity Movements Upper (arms, wrists, hands, fingers): None, normal Lower (legs, knees, ankles, toes): None, normal, Trunk Movements Neck, shoulders, hips: None, normal, Overall  Severity Severity of abnormal movements (highest score from questions above): None, normal Incapacitation due to abnormal movements: None, normal Patient's awareness of abnormal movements (rate only patient's report): No Awareness, Dental Status Current problems with teeth and/or dentures?: No Does patient usually wear dentures?: No  CIWA:  CIWA-Ar Total: 0 COWS:     Musculoskeletal: Strength & Muscle Tone: within normal limits Gait & Station: normal Patient leans: N/A  Psychiatric Specialty Exam: Physical Exam  Review of Systems  Genitourinary: Positive for dysuria.  Psychiatric/Behavioral: Positive for depression ("Improving") and substance abuse (Hx. polysubstance dependence). Negative for hallucinations, memory loss and suicidal ideas. The patient is nervous/anxious and has insomnia.   chronic pain, no nausea, no vomiting   Blood pressure 118/88, pulse 77, temperature 97.7 F (36.5 C), temperature source Oral, resp. rate 16, height 5' 4"  (1.626 m), weight 93.9 kg (207 lb), SpO2 98 %.Body mass index is 35.53 kg/m.  General Appearance: improved grooming today   Eye Contact:  Good  Speech:  Normal Rate  Volume:  Normal  Mood:  reports improving mood   Affect:  presents with improving range of affect, vaguely anxious, smiles at times appropriately during session  Thought Process:  Goal Directed and Descriptions of Associations: Intact  Orientation:  Full (Time, Place, and Person)  Thought Content:  less ruminative and less focused on pain/medications today. Denies hallucinations, no delusions  ( about pain, medications ). Denies hallucinations, no delusions are expressed   Suicidal Thoughts:  No denies suicidal or self injurious ideations   Homicidal Thoughts:  No  Memory:  Recent and remote grossly intact   Judgement:  Other:  improving   Insight:  improving   Psychomotor Activity:  Normal  Concentration:  Concentration: Good and Attention Span: Good  Recall:  Good  Fund of  Knowledge:  Good  Language:  Good  Akathisia:  No  Handed:  Right  AIMS (if indicated):     Assets:  Desire for Improvement Resilience  ADL's:  Intact  Cognition:  WNL  Sleep:  Number of Hours: 5   Assessment - patient presents partially improved compared to yesterday, at this time  endorsing improving mood and presenting less focused on pain or medications. She is more future oriented. States she is wanting discharge soon, but is concerned about returning home, as she shares it with her SO, who is currently drinking , and she states she plans to go stay with a friend instead. Denies medication side effects.   Treatment Plan Summary:  Plan reviewed as below today 4/6  Daily contact with patient to assess and evaluate symptoms and progress in treatment and Medication management Encourage group and milieu participation to work on coping skills and symptom reduction  Reviewed past medical records & treatment plan.   For Depressed mood:  Will continue Cymbalta 60 mg po bid.   For Anxiety/agitation: Increase Gabapentin to 800  mg  mg po tid Librium 25 mgrs Q 6 hours for symptoms of withdrawal or severe anxiety    For pain/trigeminal neuralgia: Gabapentin 800 mgrs tid Cymbalta 60 mgrs bid     Treatment team working on disposition options     Jenne Campus, MD 05/19/2016, 2:01 PM   Patient ID: Brittany [name], female   DOB: 1965/11/27, 51 y.o.   MRN: [id number]

## 2016-05-19 NOTE — Progress Notes (Signed)
D: Patient up and visible in the milieu. Frequently asking for meds, particularly librium and pain medications.  Rating depression at a 4/10, hopelessness at a 0/10 and anxiety at an 8/10. Rates sleep as poor, appetite as fair, energy as normal and concentration as good. Patient's affect anxious, agitated with congruent mood. Became agitated and angry after lunch, screaming at staff, crying, cursing. States she heard a negative comment by staff which set her off though she acknowledges comment was not directed toward or about patient. "Please just given me one more librium. Just one time." States goal for today is to "discharge, focus, therapy, keeping positive relationships, stay positive." Patient's pain remains at a 10/10.   A: Notified MD and NP regarding patient complaints and concerns. Order received to restart librium for anxiety which was given. Medicated per orders, various prns given - fiorcet, vistaril, advil, tylenol.  Emotional support offered and patient assisted to the quiet room to decrease stimuli. Self inventory reviewed. Encouraged completion of Suicide Safety Plan. Discussed POC with MD, SW.  Fall precautions in place and reviewed.   R: Patient verbalizes understanding of POC, falls teacing however refuses to use wheelchair (staff continues to strongly encourage.) Continues to state pain is unbearable at a 10/10. "It's always a 10." States librium did not work. MD presently speaking with patient. Patient denies SI/HI and remains safe on level III obs. Will continue to monitor closely.

## 2016-05-19 NOTE — Progress Notes (Signed)
Patient observed up and walking around the milieu without wheelchair. Patient reeducated about fall safety precautions and encouraged to use her wheelchair per MD order. Patient verbalized understanding.

## 2016-05-19 NOTE — Progress Notes (Signed)
Recreation Therapy Notes  Date:  05/19/16    Time: 0930 Location: [address] Hall Dayroom  Group Topic: Stress Management  Goal Area(s) Addresses:  Patient will verbalize importance of using healthy stress management.  Patient will identify positive emotions associated with healthy stress management.   Behavioral Response: Engaged  Intervention: Stress Management  Activity :  Gratitude Meditation.  LRT introduced the stress management technique of meditation to patients.  LRT played [name] meditation that allowed patients to focus on breathing and being grateful for people who have been supportive in their lives.  Patients were to follow along as the meditation played to fully participate and engage in the activity.  Education:  Stress Management, Discharge Planning.   Education Outcome: Acknowledges edcuation/In group clarification offered/Needs additional education  Clinical Observations/Feedback: Pt attended group.    Brittany [name], LRT/CTRS     Brittany [name], Brittany [name] 05/19/2016 11:21 AM

## 2016-05-19 NOTE — Progress Notes (Signed)
  [facility] Adult Case Management Discharge Plan :  Will you be returning to the same living situation after discharge:  Yes,  home At discharge, do you have transportation home?: Yes,  boyfriend/bus pass also provided to pt per her request. Patient scheduled for discharge after lunch on Saturday 05/20/16 Do you have the ability to pay for your medications: Yes,  [facility]  Release of information consent forms completed and submitted to medical records--pt refused to sign [facility] consent.  Patient to Follow up at: Follow-up Information    [facility] Follow up.   Specialty:  Behavioral Health Why:  Walk in between 8am-9am Monday through Friday for hospital follow-up/medication management/counseling services. Please walk in within 7 days of hospital discharge. Patient accepted Transitional Care Team services, services to begin on day of discharge.  Contact information: [address] [place] [address] [phone]           Next level of care provider has access to [facility] Link:no--patient also refused to sign release but stated that she would be going to [facility] for outpatient services.   Safety Planning and Suicide Prevention discussed: Yes,  SPE completed with pt; pt declined to consent to family contact. SPI pamphlet and Mobile Crisis information provided.  Have you used any form of tobacco in the last 30 days? (Cigarettes, Smokeless Tobacco, Cigars, and/or Pipes): Yes  Has patient been referred to the Quitline?: Patient refused referral  Patient has been referred for addiction treatment: Yes  Brittany [name] N Smart LCSW 05/19/2016, 10:35 AM

## 2016-05-19 NOTE — [facility] Suicide Risk Assessment (Signed)
[facility] INPATIENT:  Family/Significant Other Suicide Prevention Education  Suicide Prevention Education:  Patient Refusal for Family/Significant Other Suicide Prevention Education: The patient Brittany [name] has refused to provide written consent for family/significant other to be provided Family/Significant Other Suicide Prevention Education during admission and/or prior to discharge.  Physician notified.  SPE completed with pt, as pt refused to consent to family contact. SPI pamphlet provided to pt and pt was encouraged to share information with support network, ask questions, and talk about any concerns relating to SPE. Pt denies access to guns/firearms and verbalized understanding of information provided. Mobile Crisis information also provided to pt.   Avalina Benko N Smart LCSW 05/19/2016, 10:35 AM

## 2016-05-19 NOTE — [facility] Group Notes (Signed)
[facility] LCSW Group Therapy  05/19/2016 3:12 PM  Type of Therapy:  Group Therapy  Participation Level:  Did Not Attend-pt invited. Pt upset about earlier incident with staff member per pt and chose to remain in room.   Summary of Progress/Problems: Feelings around Relapse. Group members discussed the meaning of relapse and shared personal stories of relapse, how it affected them and others, and how they perceived themselves during this time. Group members were encouraged to identify triggers, warning signs and coping skills used when facing the possibility of relapse. Social supports were discussed and explored in detail.  Midori Dado N Smart LCSW 05/19/2016, 3:12 PM

## 2016-05-20 ENCOUNTER — Encounter (HOSPITAL_COMMUNITY): Payer: Self-pay | Admitting: Registered Nurse

## 2016-05-20 MED ORDER — BACITRACIN ZINC 500 UNIT/GM EX OINT: TOPICAL_OINTMENT | Freq: Two times a day (BID) | CUTANEOUS | 0 refills | Status: DC

## 2016-05-20 MED ORDER — HYDROXYZINE HCL 25 MG PO TABS: 25.0000 mg | ORAL_TABLET | Freq: Three times a day (TID) | ORAL | 0 refills | Status: DC | PRN

## 2016-05-20 MED ORDER — TOPIRAMATE 50 MG PO TABS: 50.0000 mg | ORAL_TABLET | Freq: Two times a day (BID) | ORAL | 0 refills | Status: DC

## 2016-05-20 MED ORDER — DULOXETINE HCL 60 MG PO CPEP: 60.0000 mg | ORAL_CAPSULE | Freq: Two times a day (BID) | ORAL | 0 refills | Status: DC

## 2016-05-20 MED ORDER — GABAPENTIN 400 MG PO CAPS: 800.0000 mg | ORAL_CAPSULE | Freq: Three times a day (TID) | ORAL | 0 refills | Status: DC

## 2016-05-20 NOTE — Discharge Summary (Signed)
Physician Discharge Summary Note  Patient:  Brittany [name] is an 51 y.o., female MRN:  [id number] DOB:  1965-10-14 Patient phone:  [phone] (home)  Patient address:   [address] [place] [address],  Total Time spent with patient: 45 minutes  Date of Admission:  05/15/2016 Date of Discharge: 04/19/16/18  Reason for Admission:  Worsening depression  Principal Problem: Bipolar I disorder, most recent episode depressed [facility]) Discharge Diagnoses: Patient Active Problem List   Diagnosis Date Noted  . Bipolar I disorder, most recent episode depressed (HCC) [F31.30] 05/16/2016  . History of trigeminal neuralgia [Z86.69] 05/16/2016  . Benzodiazepine dependence (HCC) [F13.20] 05/16/2016  . Alcohol use disorder, moderate, dependence (HCC) [F10.20] 05/16/2016  . Positive hepatitis C antibody test [R76.8] 12/24/2015  . Bipolar 1 disorder (HCC) [F31.9] 12/23/2015  . Trigeminal neuralgia [G50.0] 12/23/2015  . Overdose [T50.901A] 12/22/2015  . Baclofen overdose [T42.8X1A] 10/28/2015  . 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 Acute respiratory failure with hypoxemia (HCC) [J96.01]   . Bipolar I disorder, most recent episode mixed (HCC) [F31.60] 06/14/2015  . Essential hypertension [I10]   . Sinus bradycardia [R00.1] 06/10/2015  . Prolonged QT interval [R94.31] 05/03/2015  . Morbid obesity due to excess calories (HCC) [E66.01]   . GERD (gastroesophageal reflux disease) [K21.9]   . Migraine without aura and without status migrainosus, not intractable [G43.009]   . Overdose [T50.901A] 03/23/2015  . History of hepatitis C virus infection [Z86.19] 02/26/2015  . 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 Right facial pain [R51] 01/05/2015  . Polypharmacy [Z79.899] 01/18/2012    Class: Chronic  . Borderline personality disorder [F60.3] 01/18/2012  . Hypothyroidism [E03.9] 09/16/2011    Past Psychiatric History: Bipolar affective  disorder.  Past Medical History:  Past Medical History:  Diagnosis Date  . Anxiety   . Bipolar 1 disorder (HCC)   . Bulging lumbar disc   . Chronic bronchitis (HCC)   . Chronic lower back pain   . Depression   . Facial nerve injury   . Facial pain 11/2013   walking dog when she tripped and "faceplanted" landing on her face.   Marland Kitchen GERD (gastroesophageal reflux disease)   . Goiter   . Head injury   . Heart murmur   . Hyperlipidemia   . Hypertension   . Hypothyroidism   . Migraine    "weekly" (03/23/2015)  . Ruptured intervertebral disc   . Seizures (HCC)    "I've had a few; had a grand mal when I took Neurotin; still take Neurotin" (03/23/2015)  . Short-term memory loss     Past Surgical History:  Procedure Laterality Date  . ABDOMINAL HYSTERECTOMY    . CESAREAN SECTION  2004  . EYE SURGERY Right 11/2013   emergent lateral canthotomy due to proptosis  [name] 11/30/2013  . TONSILLECTOMY     Family History:  Family History  Problem Relation Age of Onset  . Migraines Mother   . Bipolar disorder Mother   . Migraines Father   . Other Father   . Parkinson's disease Father   . Migraines Sister    Family Psychiatric  History:  Mother/Bipolar disorder Social History:  History  Alcohol Use No    Comment: 03/23/2015 "might drink at Christmastime"     History  Drug Use  . Types: Marijuana    Comment: 03/23/2015 ""haven't smoked marijuana for years"    Social History   Social History  . Marital status: Single  Spouse name: N/A  . Number of children: 1  . Years of education: 62   Occupational History  . Unemployed    Social History Main Topics  . Smoking status: Current Every Day Smoker  . Smokeless tobacco: Never Used  . Alcohol use No     Comment: 03/23/2015 "might drink at Christmastime"  . Drug use: Yes    Types: Marijuana     Comment: 03/23/2015 ""haven't smoked marijuana for years"  . Sexual activity: Yes    Birth control/ protection: Condom   Other Topics  Concern  . None   Social History Narrative   ** Merged History Encounter **       Lives at home with her fiancee. Right-handed. No caffeine use.    Hospital Course:  Nyia Tsao was admitted for Bipolar I disorder, most recent episode depressed (HCC) and crisis management.  She was treated with the following medications Librium withdrawal protocol; Cymbalta for depression; and Vistaril for anxiety.  Marissah Vandemark was discharged with current medication and was instructed on how to take medications as prescribed; (details listed below under Medication List).  Medical problems were identified and treated as needed.  Home medications were restarted as appropriate.  Improvement was monitored by observation and Stanton Kidney daily report of symptom reduction.  Emotional and mental status was monitored by daily self-inventory reports completed by Stanton Kidney and clinical staff.         Rupa Lagan was evaluated by the treatment team for stability and plans for continued recovery upon discharge.  Samentha Perham motivation was an integral factor for scheduling further treatment.  Employment, transportation, bed availability, health status, family support, and any pending legal issues were also considered during her hospital stay.  She was offered further treatment options upon discharge including but not limited to Residential, Intensive Outpatient, and Outpatient treatment.  Leonarda Leis will follow up with the services as listed below under Follow Up Information.     Upon completion of this admission the [place] was both mentally and medically stable for discharge denying suicidal/homicidal ideation, auditory/visual/tactile hallucinations, delusional thoughts and paranoia.      Physical Findings: AIMS: Facial and Oral Movements Muscles of Facial Expression: None, normal Lips and Perioral Area: None, normal Jaw: None, normal Tongue: None, normal,Extremity Movements Upper  (arms, wrists, hands, fingers): None, normal Lower (legs, knees, ankles, toes): None, normal, Trunk Movements Neck, shoulders, hips: None, normal, Overall Severity Severity of abnormal movements (highest score from questions above): None, normal Incapacitation due to abnormal movements: None, normal Patient's awareness of abnormal movements (rate only patient's report): No Awareness, Dental Status Current problems with teeth and/or dentures?: No Does patient usually wear dentures?: No  CIWA:  CIWA-Ar Total: 4 COWS:     Musculoskeletal: Strength & Muscle Tone: within normal limits Gait & Station: normal Patient leans: N/A  Psychiatric Specialty Exam:  See Suicide Risk Assessment Physical Exam  ROS  Blood pressure (!) 145/93, pulse 84, temperature 98.2 F (36.8 C), temperature source Oral, resp. rate 20, height  (1.626 m), weight 93.9 kg (207 lb), SpO2 100 %.Body mass index is 35.53 kg/m.   Have you used any form of tobacco in the last 30 days? (Cigarettes, Smokeless Tobacco, Cigars, and/or Pipes): Yes  Has this patient used any form of tobacco in the last 30 days? (Cigarettes, Smokeless Tobacco, Cigars, and/or Pipes) Yes, Yes, A prescription for an FDA-approved tobacco cessation medication was offered at discharge and the patient refused  Blood Alcohol level:  Lab Results  Component Value Date   ETH 112 (H) 05/14/2016   ETH <5 12/22/2015    Metabolic Disorder Labs:  Lab Results  Component Value Date   HGBA1C 5.1 05/01/2015   MPG 100 05/01/2015   Lab Results  Component Value Date   PROLACTIN 69.9 (H) 05/01/2015   Lab Results  Component Value Date   CHOL 199 04/30/2015   TRIG 386 (H) 08/02/2015   HDL 41 04/30/2015   CHOLHDL 4.9 04/30/2015   VLDL 53 (H) 04/30/2015   LDLCALC 105 (H) 04/30/2015    See Psychiatric Specialty Exam and Suicide Risk Assessment completed by Attending Physician prior to discharge.  Discharge destination:  Home  Is patient on multiple  antipsychotic therapies at discharge:  No   Has Patient had three or more failed trials of antipsychotic monotherapy by history:  No  Recommended Plan for Multiple Antipsychotic Therapies: NA  Discharge Instructions    Diet - low sodium heart healthy    Complete by:  As directed    Discharge instructions    Complete by:  As directed    Take all of you medications as prescribed by your mental healthcare provider.  Report any adverse effects and reactions from your medications to your outpatient provider promptly.  Do not engage in alcohol and or illegal drug use while on prescription medicines. Keep all scheduled appointments. This is to ensure that you are getting refills on time and to avoid any interruption in your medication.  If you are unable to keep an appointment call to reschedule.  Be sure to follow up with resources and follow ups given. In the event of worsening symptoms call the crisis hotline, 911, and or go to the nearest emergency department for appropriate evaluation and treatment of symptoms. Follow-up with your primary care provider for your medical issues, concerns and or health care needs.   Increase activity slowly    Complete by:  As directed      Allergies as of 05/20/2016      Reactions   Toradol [ketorolac Tromethamine] Swelling, Other (See Comments)   Arms and stomach swelled   Suboxone [buprenorphine Hcl-naloxone Hcl] Other (See Comments)   Reaction:  Cellulitis    Subutex [buprenorphine] Rash   Tegretol [carbamazepine] Rash   Trazodone And Nefazodone Other (See Comments)   Reaction:  Bladder infection      Medication List    STOP taking these medications   ALEVE 220 MG tablet Generic drug:  naproxen sodium   lamoTRIgine 100 MG tablet Commonly known as:  LAMICTAL   LORazepam 0.5 MG tablet Commonly known as:  ATIVAN   traMADol 50 MG tablet Commonly known as:  ULTRAM     TAKE these medications     Indication  bacitracin ointment Apply topically  2 (two) times daily.  Indication:  Infection caused by Bacteria   butalbital-acetaminophen-caffeine 50-325-40 MG tablet Commonly known as:  FIORICET, ESGIC Take 1 tablet by mouth every 4 (four) hours as needed for headache or migraine.  Indication:  Migraine Headache   DULoxetine 60 MG capsule Commonly known as:  CYMBALTA Take 1 capsule (60 mg total) by mouth 2 (two) times daily.  Indication:  mood stabilization   gabapentin 400 MG capsule Commonly known as:  NEURONTIN Take 2 capsules (800 mg total) by mouth 3 (three) times daily. What changed:  how much to take  Indication:  Aggressive Behavior, Agitation, Neuropathic Pain   hydrOXYzine 25 MG tablet Commonly known as:  ATARAX/VISTARIL  Take 1 tablet (25 mg total) by mouth 3 (three) times daily as needed for anxiety (Sleep).  Indication:  Anxiety Neurosis   ibuprofen 200 MG tablet Commonly known as:  ADVIL,MOTRIN Take 200 mg by mouth every 6 (six) hours as needed for headache (pain).  Indication:  pain   multivitamin with minerals Tabs tablet Take 1 tablet by mouth daily.  Indication:  Nutritional Support   topiramate 50 MG tablet Commonly known as:  TOPAMAX Take 50 mg by mouth every 4 (four) hours as needed (nerve pain).  Indication:  Migraine Headache      Follow-up Information    [facility] Follow up.   Specialty:  Behavioral Health Why:  Walk in between 8am-9am Monday through Friday for hospital follow-up/medication management/counseling services. Please walk in within 7 days of hospital discharge. Patient accepted Transitional Care Team services, services to begin on day of discharge.  [address] [place] [address] [phone]           Follow-up recommendations:  Activity:  As tolerated Diet:  Heart Healthy  Comments:  Nikkia Devoss has been instructed to take medications as prescribed; and report adverse effects to outpatient provider.  Follow up with primary doctor for any  medical issues and If symptoms recur report to nearest emergency or crisis hot line.    SignedAssunta Found, NP 05/20/2016, 9:07 AM   Patient seen, Suicide Assessment Completed.  Disposition Plan Reviewed

## 2016-05-20 NOTE — Progress Notes (Signed)
Pt awoke and requested PRN Fioricet for pain from headache of 10/10.  Medication was administered and pt had tab under her tongue when mouth check was performed.  Pt then said she swallowed it and upon recheck, tab was in her cheek. Pt reported difficulty swallowing although this was not a problem for her earlier tonight.   Pt agreed to take medication with pudding.  Tab was crushed and mixed with pudding and pt took medication.  Will continue to monitor and assess for safety.

## 2016-05-20 NOTE — Progress Notes (Signed)
D: Pt came to nurse's station to ask about what medications she can get and when she can get them upon initial approach.  She was very focused on medications and discussed how she plans to go to a pain clinic after discharge.  She reports her goal is to "be more positive."  Pt reports her anxiety is "through the roof."  However, pt was seen smiling and laughing while interacting with peers after she made this statement.  Pt denies SI/HI, denies hallucinations, reports chronic pain of 10/10.  Pt is intrusive with peers and staff.  Pt attended evening group.    A: Introduced self to pt.  Actively listened to pt and offered support and encouragement.  PRN medication administered for headache, anxiety, pain, and sleep.  Mouth check performed after medications administered and pt had not swallowed her medications.  Pt swallowed medications after mouth check performed.    R: Pt is safe on the unit.  Pt verbally contracts for safety.  Will continue to monitor and assess.

## 2016-05-20 NOTE — [facility] Suicide Risk Assessment (Signed)
[facility] Discharge Suicide Risk Assessment   Principal Problem: Bipolar I disorder, most recent episode depressed [facility]) Discharge Diagnoses:  Patient Active Problem List   Diagnosis Date Noted  . Bipolar I disorder, most recent episode depressed (HCC) [F31.30] 05/16/2016  . History of trigeminal neuralgia [Z86.69] 05/16/2016  . Benzodiazepine dependence (HCC) [F13.20] 05/16/2016  . Alcohol use disorder, moderate, dependence (HCC) [F10.20] 05/16/2016  . Positive hepatitis C antibody test [R76.8] 12/24/2015  . Bipolar 1 disorder (HCC) [F31.9] 12/23/2015  . Trigeminal neuralgia [G50.0] 12/23/2015  . Overdose [T50.901A] 12/22/2015  . Baclofen overdose [T42.8X1A] 10/28/2015  . 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 Acute respiratory failure with hypoxemia (HCC) [J96.01]   . Bipolar I disorder, most recent episode mixed (HCC) [F31.60] 06/14/2015  . Essential hypertension [I10]   . Sinus bradycardia [R00.1] 06/10/2015  . Prolonged QT interval [R94.31] 05/03/2015  . Morbid obesity due to excess calories (HCC) [E66.01]   . GERD (gastroesophageal reflux disease) [K21.9]   . Migraine without aura and without status migrainosus, not intractable [G43.009]   . Overdose [T50.901A] 03/23/2015  . History of hepatitis C virus infection [Z86.19] 02/26/2015  . 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 Right facial pain [R51] 01/05/2015  . Polypharmacy [Z79.899] 01/18/2012    Class: Chronic  . Borderline personality disorder [F60.3] 01/18/2012  . Hypothyroidism [E03.9] 09/16/2011    Total Time spent with patient: 30 minutes  Musculoskeletal: Strength & Muscle Tone: within normal limits Gait & Station: normal Patient leans: N/A  Psychiatric Specialty Exam: ROS reports chronic pain,related to  Trigeminal Neuralgia, Ulnar Nerve damage. Denies chest pain, no shortness of breath, no vomiting , no fever.  Blood pressure (!) 145/93, pulse 84,  temperature 98.2 F (36.8 C), temperature source Oral, resp. rate 20, height  (1.626 m), weight 93.9 kg (207 lb), SpO2 100 %.Body mass index is 35.53 kg/m.  General Appearance: improved grooming   Eye Contact::  Good  Speech:  Normal Rate409  Volume:  Decreased  Mood:  reports her mood is better, states " I am feeling better"  Affect:  Appropriate and reactive   Thought Process:  Linear and Descriptions of Associations: Intact  Orientation:  Other:  fully alert and attentive   Thought Content:  no hallucinations, no delusions, not internally preoccupied   Suicidal Thoughts:  No denies any suicidal or self injurious ideations, denies any homicidal or violent ideations   Homicidal Thoughts:  No  Memory:  reent and remote grossly intact   Judgement:  Improving   Insight:  Fair  Psychomotor Activity:  Normal  Concentration:  Good  Recall:  Good  Fund of Knowledge:Good  Language: Good  Akathisia:  Negative  Handed:  Right  AIMS (if indicated):     Assets:  Desire for Improvement Resilience  Sleep:  Number of Hours: 5.25  Cognition: WNL  ADL's:  Intact   Mental Status Per Nursing Assessment::   On Admission:     Demographic Factors:  51 year old female, has a 29 year old daughter who lives with adoptive family,  lives at home with her fiance, on disability  Loss Factors: history of chronic pain, fiance is alcoholic, disability  Historical Factors: History of prior psychiatric admissions, history of prior suicide attempt at 29, History of Opiate Use Disorder   Risk Reduction Factors:   Living with another person, especially a relative and Positive coping skills or problem solving skills  Continued Clinical Symptoms:  At this time patient is alert, attentive, calm, does not appear to be in any acute distress, mood is improved , at this time states she is feeling better, affect is improved, no thought disorder, no suicidal ideations, no homicidal ideations, no psychotic  symptoms. At this time future oriented, states she plans to get elective surgical procedure to address ulnar nerve entrapment, pain. States she plans to go home to pick up clothes and belongings and is thinking of living with a supportive friend for a period of time. Denies medication side effects.   Cognitive Features That Contribute To Risk:  No gross cognitive deficits noted upon discharge. Is alert , attentive, and oriented x 3   Suicide Risk:  Mild:  Suicidal ideation of limited frequency, intensity, duration, and specificity.  There are no identifiable plans, no associated intent, mild dysphoria and related symptoms, good self-control (both objective and subjective assessment), few other risk factors, and identifiable protective factors, including available and accessible social support.  Follow-up Information    [facility] Follow up.   Specialty:  Behavioral Health Why:  Walk in between 8am-9am Monday through Friday for hospital follow-up/medication management/counseling services. Please walk in within 7 days of hospital discharge. Patient accepted Transitional Care Team services, services to begin on day of discharge.  Contact information: [address] [place] [address] [phone]           Plan Of Care/Follow-up recommendations:  Activity:  as tolerated  Diet:  heart healthy Tests:  NA Other:  See below  Patient is leaving unit in good spirits  Plans to go live with a good friend , whom she describes as supportive Plans to follow up as above  Has a PCP for medical management as needed   Craige Cotta, MD 05/20/2016, 9:56 AM

## 2016-05-20 NOTE — Progress Notes (Signed)
D: Patient up and visible in the milieu. Intrusive with frequent needs. Focused on medications and requests them frequently. States goal for today is to discharge. Continues to report chronic R arm and facial pain of a 10/10 which does not resolve with prn's however states, "it takes the edge off."   A: Medicated per orders, fiorcet prn given. Emotional support offered and self inventory reviewed. Encouraged completion of Suicide Safety Plan. Discussed POC with MD, SW.  Fall precautions reviewed and in place though patient continues to refuse use of wheelchair.   R: Patient verbalizes understanding of POC. On reassess, patient's pain remains a 10/10. Patient denies SI/HI and remains safe on level III obs. Awaiting ride for discharge.

## 2016-05-20 NOTE — Plan of Care (Signed)
Problem: Medication: Goal: Compliance with prescribed medication regimen will improve Outcome: Not Progressing Pt attempted to cheek medications tonight.  She took the medications after a mouth check was performed.

## 2016-05-20 NOTE — Progress Notes (Signed)
Patient verbalizes readiness for discharge. Follow up plan explained, AVS, transition record and SRA given along with prescriptions. All belongings returned. Patient verbalizes understanding. Denies SI/HI and assures this Probation officer she will seek assistance should that change. Patient discharged ambulatory and in stable condition to [place] with plan to go to friend's house. Withdrew cash from the ATM. Assisted into the cab by driver and this Probation officer.

## 2016-05-21 NOTE — [facility] Group Notes (Signed)
  [facility] LCSW Group Therapy Note  Date and  10:00 AM  Type of Therapy and Topic:  Group Therapy: Avoiding Self-Sabotaging and Enabling Behaviors  Participation Level:  Did Not Attend; patient was encouraged to attend group yet intent on preparing for discharge.   Summary of Progress/Problems:  The main focus of today's process group was for the patient to identify ways in which they have in the past sabotaged their own recovery. Motivational Interviewing was utilized to identify motivation they may have for wanting to change. The Stages of Change were explained using a handout, and patients identified where they currently are with regard to stages of change.    Carney Bern, LCSW

## 2016-09-19 ENCOUNTER — Emergency Department (HOSPITAL_COMMUNITY): Payer: Medicaid Other

## 2016-09-19 ENCOUNTER — Other Ambulatory Visit: Payer: Self-pay

## 2016-09-19 ENCOUNTER — Inpatient Hospital Stay (HOSPITAL_COMMUNITY): Payer: Medicaid Other

## 2016-09-19 ENCOUNTER — Inpatient Hospital Stay (HOSPITAL_COMMUNITY)
Admission: EM | Admit: 2016-09-19 | Discharge: 2016-09-21 | DRG: 917 | Disposition: A | Discharge status: Other Acute Inpatient Hospital | Payer: Medicaid Other | Attending: Internal Medicine | Admitting: Internal Medicine

## 2016-09-19 DIAGNOSIS — G9340 Encephalopathy, unspecified: Secondary | ICD-10-CM | POA: Diagnosis not present

## 2016-09-19 DIAGNOSIS — F1410 Cocaine abuse, uncomplicated: Secondary | ICD-10-CM | POA: Diagnosis present

## 2016-09-19 DIAGNOSIS — J9601 Acute respiratory failure with hypoxia: Secondary | ICD-10-CM

## 2016-09-19 DIAGNOSIS — R768 Other specified abnormal immunological findings in serum: Secondary | ICD-10-CM | POA: Diagnosis present

## 2016-09-19 DIAGNOSIS — T50901A Poisoning by unspecified drugs, medicaments and biological substances, accidental (unintentional), initial encounter: Secondary | ICD-10-CM | POA: Diagnosis not present

## 2016-09-19 DIAGNOSIS — F419 Anxiety disorder, unspecified: Secondary | ICD-10-CM | POA: Diagnosis not present

## 2016-09-19 DIAGNOSIS — G5620 Lesion of ulnar nerve, unspecified upper limb: Secondary | ICD-10-CM | POA: Diagnosis present

## 2016-09-19 DIAGNOSIS — G40909 Epilepsy, unspecified, not intractable, without status epilepticus: Secondary | ICD-10-CM | POA: Diagnosis present

## 2016-09-19 DIAGNOSIS — R4182 Altered mental status, unspecified: Secondary | ICD-10-CM

## 2016-09-19 DIAGNOSIS — T43212A Poisoning by selective serotonin and norepinephrine reuptake inhibitors, intentional self-harm, initial encounter: Principal | ICD-10-CM | POA: Diagnosis present

## 2016-09-19 DIAGNOSIS — G9341 Metabolic encephalopathy: Secondary | ICD-10-CM | POA: Diagnosis not present

## 2016-09-19 DIAGNOSIS — Z915 Personal history of self-harm: Secondary | ICD-10-CM

## 2016-09-19 DIAGNOSIS — T50902D Poisoning by unspecified drugs, medicaments and biological substances, intentional self-harm, subsequent encounter: Secondary | ICD-10-CM | POA: Diagnosis not present

## 2016-09-19 DIAGNOSIS — F17210 Nicotine dependence, cigarettes, uncomplicated: Secondary | ICD-10-CM | POA: Diagnosis not present

## 2016-09-19 DIAGNOSIS — E785 Hyperlipidemia, unspecified: Secondary | ICD-10-CM | POA: Diagnosis present

## 2016-09-19 DIAGNOSIS — R55 Syncope and collapse: Secondary | ICD-10-CM | POA: Diagnosis present

## 2016-09-19 DIAGNOSIS — I10 Essential (primary) hypertension: Secondary | ICD-10-CM | POA: Diagnosis present

## 2016-09-19 DIAGNOSIS — F1910 Other psychoactive substance abuse, uncomplicated: Secondary | ICD-10-CM | POA: Diagnosis not present

## 2016-09-19 DIAGNOSIS — F3160 Bipolar disorder, current episode mixed, unspecified: Secondary | ICD-10-CM | POA: Diagnosis not present

## 2016-09-19 DIAGNOSIS — M791 Myalgia: Secondary | ICD-10-CM | POA: Diagnosis not present

## 2016-09-19 DIAGNOSIS — F1210 Cannabis abuse, uncomplicated: Secondary | ICD-10-CM | POA: Diagnosis present

## 2016-09-19 DIAGNOSIS — F39 Unspecified mood [affective] disorder: Secondary | ICD-10-CM | POA: Diagnosis not present

## 2016-09-19 DIAGNOSIS — J9600 Acute respiratory failure, unspecified whether with hypoxia or hypercapnia: Secondary | ICD-10-CM

## 2016-09-19 DIAGNOSIS — E876 Hypokalemia: Secondary | ICD-10-CM | POA: Diagnosis present

## 2016-09-19 DIAGNOSIS — B1920 Unspecified viral hepatitis C without hepatic coma: Secondary | ICD-10-CM | POA: Diagnosis present

## 2016-09-19 DIAGNOSIS — F1011 Alcohol abuse, in remission: Secondary | ICD-10-CM | POA: Diagnosis not present

## 2016-09-19 DIAGNOSIS — F17200 Nicotine dependence, unspecified, uncomplicated: Secondary | ICD-10-CM | POA: Diagnosis present

## 2016-09-19 DIAGNOSIS — F332 Major depressive disorder, recurrent severe without psychotic features: Secondary | ICD-10-CM | POA: Diagnosis present

## 2016-09-19 DIAGNOSIS — R110 Nausea: Secondary | ICD-10-CM | POA: Diagnosis not present

## 2016-09-19 DIAGNOSIS — Z90710 Acquired absence of both cervix and uterus: Secondary | ICD-10-CM | POA: Diagnosis not present

## 2016-09-19 DIAGNOSIS — Z818 Family history of other mental and behavioral disorders: Secondary | ICD-10-CM

## 2016-09-19 DIAGNOSIS — F3130 Bipolar disorder, current episode depressed, mild or moderate severity, unspecified: Secondary | ICD-10-CM | POA: Diagnosis not present

## 2016-09-19 DIAGNOSIS — E039 Hypothyroidism, unspecified: Secondary | ICD-10-CM | POA: Diagnosis not present

## 2016-09-19 DIAGNOSIS — K219 Gastro-esophageal reflux disease without esophagitis: Secondary | ICD-10-CM | POA: Diagnosis present

## 2016-09-19 DIAGNOSIS — G500 Trigeminal neuralgia: Secondary | ICD-10-CM | POA: Diagnosis present

## 2016-09-19 DIAGNOSIS — Z789 Other specified health status: Secondary | ICD-10-CM

## 2016-09-19 DIAGNOSIS — F1211 Cannabis abuse, in remission: Secondary | ICD-10-CM | POA: Diagnosis not present

## 2016-09-19 DIAGNOSIS — F339 Major depressive disorder, recurrent, unspecified: Secondary | ICD-10-CM | POA: Diagnosis not present

## 2016-09-19 DIAGNOSIS — T1491XA Suicide attempt, initial encounter: Secondary | ICD-10-CM | POA: Diagnosis not present

## 2016-09-19 DIAGNOSIS — F1310 Sedative, hypnotic or anxiolytic abuse, uncomplicated: Secondary | ICD-10-CM | POA: Diagnosis not present

## 2016-09-19 DIAGNOSIS — R51 Headache: Secondary | ICD-10-CM | POA: Diagnosis not present

## 2016-09-19 DIAGNOSIS — T50902A Poisoning by unspecified drugs, medicaments and biological substances, intentional self-harm, initial encounter: Secondary | ICD-10-CM

## 2016-09-19 DIAGNOSIS — S0450XA Injury of facial nerve, unspecified side, initial encounter: Secondary | ICD-10-CM | POA: Diagnosis present

## 2016-09-19 DIAGNOSIS — T50902S Poisoning by unspecified drugs, medicaments and biological substances, intentional self-harm, sequela: Secondary | ICD-10-CM | POA: Diagnosis not present

## 2016-09-19 LAB — URINALYSIS, ROUTINE W REFLEX MICROSCOPIC
Bilirubin Urine: NEGATIVE
GLUCOSE, UA: NEGATIVE mg/dL
HGB URINE DIPSTICK: NEGATIVE
Ketones, ur: NEGATIVE mg/dL
LEUKOCYTES UA: NEGATIVE
Nitrite: NEGATIVE
PH: 6 (ref 5.0–8.0)
Protein, ur: NEGATIVE mg/dL
SPECIFIC GRAVITY, URINE: 1.014 (ref 1.005–1.030)

## 2016-09-19 LAB — I-STAT ARTERIAL BLOOD GAS, ED
Acid-Base Excess: 2 mmol/L (ref 0.0–2.0)
Bicarbonate: 29.1 mmol/L — ABNORMAL HIGH (ref 20.0–28.0)
O2 SAT: 100 %
TCO2: 31 mmol/L (ref 0–100)
pCO2 arterial: 51.8 mmHg — ABNORMAL HIGH (ref 32.0–48.0)
pH, Arterial: 7.354 (ref 7.350–7.450)
pO2, Arterial: 480 mmHg — ABNORMAL HIGH (ref 83.0–108.0)

## 2016-09-19 LAB — HEPATIC FUNCTION PANEL
ALBUMIN: 4.3 g/dL (ref 3.5–5.0)
ALBUMIN: 3.9 g/dL (ref 3.5–5.0)
ALK PHOS: 59 U/L (ref 38–126)
ALT: 25 U/L (ref 14–54)
ALT: 24 U/L (ref 14–54)
AST: 27 U/L (ref 15–41)
AST: 26 U/L (ref 15–41)
Alkaline Phosphatase: 63 U/L (ref 38–126)
BILIRUBIN INDIRECT: 0.7 mg/dL (ref 0.3–0.9)
Bilirubin, Direct: 0.2 mg/dL (ref 0.1–0.5)
Bilirubin, Direct: 0.2 mg/dL (ref 0.1–0.5)
Indirect Bilirubin: 0.6 mg/dL (ref 0.3–0.9)
TOTAL PROTEIN: 7.7 g/dL (ref 6.5–8.1)
TOTAL PROTEIN: 7.6 g/dL (ref 6.5–8.1)
Total Bilirubin: 0.8 mg/dL (ref 0.3–1.2)
Total Bilirubin: 0.9 mg/dL (ref 0.3–1.2)

## 2016-09-19 LAB — PHOSPHORUS: Phosphorus: 3 mg/dL (ref 2.5–4.6)

## 2016-09-19 LAB — ACETAMINOPHEN LEVEL: Acetaminophen (Tylenol), Serum: 23 ug/mL (ref 10–30)

## 2016-09-19 LAB — CBC WITH DIFFERENTIAL/PLATELET
Basophils Absolute: 0 10*3/uL (ref 0.0–0.1)
Basophils Relative: 0 %
EOS ABS: 0.1 10*3/uL (ref 0.0–0.7)
EOS PCT: 1 %
HCT: 34.2 % — ABNORMAL LOW (ref 36.0–46.0)
Hemoglobin: 11.3 g/dL — ABNORMAL LOW (ref 12.0–15.0)
LYMPHS ABS: 1.6 10*3/uL (ref 0.7–4.0)
Lymphocytes Relative: 21 %
MCH: 32.4 pg (ref 26.0–34.0)
MCHC: 33 g/dL (ref 30.0–36.0)
MCV: 98 fL (ref 78.0–100.0)
MONO ABS: 0.4 10*3/uL (ref 0.1–1.0)
Monocytes Relative: 6 %
Neutro Abs: 5.7 10*3/uL (ref 1.7–7.7)
Neutrophils Relative %: 72 %
PLATELETS: 257 10*3/uL (ref 150–400)
RBC: 3.49 MIL/uL — ABNORMAL LOW (ref 3.87–5.11)
RDW: 14.2 % (ref 11.5–15.5)
WBC: 7.9 10*3/uL (ref 4.0–10.5)

## 2016-09-19 LAB — COMPREHENSIVE METABOLIC PANEL
ALK PHOS: 59 U/L (ref 38–126)
ALT: 26 U/L (ref 14–54)
AST: 35 U/L (ref 15–41)
Albumin: 4.5 g/dL (ref 3.5–5.0)
Anion gap: 8 (ref 5–15)
BUN: 15 mg/dL (ref 6–20)
CALCIUM: 8.8 mg/dL — AB (ref 8.9–10.3)
CHLORIDE: 104 mmol/L (ref 101–111)
CO2: 24 mmol/L (ref 22–32)
CREATININE: 0.89 mg/dL (ref 0.44–1.00)
Glucose, Bld: 107 mg/dL — ABNORMAL HIGH (ref 65–99)
Potassium: 3.7 mmol/L (ref 3.5–5.1)
Sodium: 136 mmol/L (ref 135–145)
Total Bilirubin: 0.7 mg/dL (ref 0.3–1.2)
Total Protein: 7.9 g/dL (ref 6.5–8.1)

## 2016-09-19 LAB — RAPID URINE DRUG SCREEN, HOSP PERFORMED
AMPHETAMINES: NOT DETECTED
BARBITURATES: NOT DETECTED
BENZODIAZEPINES: POSITIVE — AB
Cocaine: POSITIVE — AB
Opiates: NOT DETECTED
TETRAHYDROCANNABINOL: NOT DETECTED

## 2016-09-19 LAB — I-STAT TROPONIN, ED: TROPONIN I, POC: 0 ng/mL (ref 0.00–0.08)

## 2016-09-19 LAB — I-STAT CG4 LACTIC ACID, ED: Lactic Acid, Venous: 2.24 mmol/L (ref 0.5–1.9)

## 2016-09-19 LAB — TRIGLYCERIDES: TRIGLYCERIDES: 178 mg/dL — AB (ref <150)

## 2016-09-19 LAB — LACTIC ACID, PLASMA: Lactic Acid, Venous: 1.3 mmol/L (ref 0.5–1.9)

## 2016-09-19 LAB — MAGNESIUM: Magnesium: 1.7 mg/dL (ref 1.7–2.4)

## 2016-09-19 LAB — ETHANOL

## 2016-09-19 LAB — TROPONIN I

## 2016-09-19 LAB — MRSA PCR SCREENING: MRSA by PCR: NEGATIVE

## 2016-09-19 LAB — SALICYLATE LEVEL: Salicylate Lvl: <7 mg/dL (ref 2.8–30.0)

## 2016-09-19 LAB — PROCALCITONIN: Procalcitonin: <0.1 ng/mL

## 2016-09-19 MED ORDER — ORAL CARE MOUTH RINSE
15.0000 mL | OROMUCOSAL | Status: DC
  Administered 2016-09-19 (×3): 15 mL via OROMUCOSAL

## 2016-09-19 MED ORDER — DOCUSATE SODIUM 50 MG/5ML PO LIQD: 100.0000 mg | Freq: Two times a day (BID) | ORAL | Status: DC | PRN

## 2016-09-19 MED ORDER — FAMOTIDINE IN NACL 20-0.9 MG/50ML-% IV SOLN
20.0000 mg | Freq: Two times a day (BID) | INTRAVENOUS | Status: DC
  Administered 2016-09-19 – 2016-09-20 (×3): 20 mg via INTRAVENOUS
  Filled 2016-09-19 (×3): qty 50

## 2016-09-19 MED ORDER — SODIUM CHLORIDE 0.9 % IV SOLN: 250.0000 mL | INTRAVENOUS | Status: DC | PRN

## 2016-09-19 MED ORDER — FENTANYL CITRATE (PF) 100 MCG/2ML IJ SOLN
INTRAMUSCULAR | Status: AC
Start: 2016-09-19 — End: 2016-09-19
  Filled 2016-09-19: qty 2

## 2016-09-19 MED ORDER — MAGNESIUM SULFATE 2 GM/50ML IV SOLN
2.0000 g | Freq: Once | INTRAVENOUS | Status: AC
  Administered 2016-09-19: 2 g via INTRAVENOUS
  Filled 2016-09-19: qty 50

## 2016-09-19 MED ORDER — PROPOFOL 1000 MG/100ML IV EMUL
0.0000 ug/kg/min | INTRAVENOUS | Status: DC
  Administered 2016-09-19: 40 ug/kg/min via INTRAVENOUS
  Filled 2016-09-19: qty 100

## 2016-09-19 MED ORDER — FENTANYL CITRATE (PF) 100 MCG/2ML IJ SOLN
100.0000 ug | INTRAMUSCULAR | Status: DC | PRN
  Administered 2016-09-19: 50 ug via INTRAVENOUS
  Filled 2016-09-19: qty 2

## 2016-09-19 MED ORDER — SUCCINYLCHOLINE CHLORIDE 20 MG/ML IJ SOLN
INTRAMUSCULAR | Status: AC | PRN
  Administered 2016-09-19: 100 mg via INTRAVENOUS

## 2016-09-19 MED ORDER — ORAL CARE MOUTH RINSE: 15.0000 mL | Freq: Four times a day (QID) | OROMUCOSAL | Status: DC

## 2016-09-19 MED ORDER — PROPOFOL 1000 MG/100ML IV EMUL
0.0000 ug/kg/min | INTRAVENOUS | Status: DC
  Administered 2016-09-19: 40 ug/kg/min via INTRAVENOUS

## 2016-09-19 MED ORDER — POTASSIUM CHLORIDE 20 MEQ/15ML (10%) PO SOLN
40.0000 meq | Freq: Once | ORAL | Status: AC
  Administered 2016-09-19: 40 meq
  Filled 2016-09-19: qty 30

## 2016-09-19 MED ORDER — CHLORHEXIDINE GLUCONATE 0.12% ORAL RINSE (MEDLINE KIT)
15.0000 mL | Freq: Two times a day (BID) | OROMUCOSAL | Status: DC
  Administered 2016-09-19: 15 mL via OROMUCOSAL

## 2016-09-19 MED ORDER — HEPARIN SODIUM (PORCINE) 5000 UNIT/ML IJ SOLN
5000.0000 [IU] | Freq: Three times a day (TID) | INTRAMUSCULAR | Status: DC
  Administered 2016-09-19 – 2016-09-21 (×7): 5000 [IU] via SUBCUTANEOUS
  Filled 2016-09-19 (×7): qty 1

## 2016-09-19 MED ORDER — SODIUM CHLORIDE 0.9 % IV BOLUS (SEPSIS)
500.0000 mL | Freq: Once | INTRAVENOUS | Status: AC
  Administered 2016-09-19: 500 mL via INTRAVENOUS

## 2016-09-19 MED ORDER — FENTANYL CITRATE (PF) 100 MCG/2ML IJ SOLN
100.0000 ug | INTRAMUSCULAR | Status: DC | PRN
  Administered 2016-09-19: 100 ug via INTRAVENOUS
  Filled 2016-09-19: qty 2

## 2016-09-19 MED ORDER — MIDAZOLAM HCL 2 MG/2ML IJ SOLN: 2.0000 mg | INTRAMUSCULAR | Status: DC | PRN

## 2016-09-19 MED ORDER — SODIUM CHLORIDE 0.9 % IV BOLUS (SEPSIS)
1000.0000 mL | Freq: Once | INTRAVENOUS | Status: AC
  Administered 2016-09-19: 1000 mL via INTRAVENOUS

## 2016-09-19 MED ORDER — ETOMIDATE 2 MG/ML IV SOLN
INTRAVENOUS | Status: AC | PRN
  Administered 2016-09-19: 20 mg via INTRAVENOUS

## 2016-09-19 MED ORDER — ACETAMINOPHEN 325 MG PO TABS
650.0000 mg | ORAL_TABLET | Freq: Four times a day (QID) | ORAL | Status: DC | PRN
  Administered 2016-09-19 – 2016-09-21 (×6): 650 mg via ORAL
  Filled 2016-09-19 (×6): qty 2

## 2016-09-19 MED ORDER — SODIUM CHLORIDE 0.9 % IV SOLN
INTRAVENOUS | Status: DC
  Administered 2016-09-19 (×2): via INTRAVENOUS

## 2016-09-19 MED ORDER — FENTANYL CITRATE (PF) 100 MCG/2ML IJ SOLN
INTRAMUSCULAR | Status: AC
  Filled 2016-09-19: qty 2

## 2016-09-19 MED ORDER — ACETYLCYSTEINE 20 % IN SOLN
140.0000 mg/kg | Freq: Once | RESPIRATORY_TRACT | Status: AC
Start: 2016-09-19 — End: 2016-09-19
  Administered 2016-09-19: 13160 mg via ORAL
  Filled 2016-09-19: qty 90

## 2016-09-19 MED ORDER — PROPOFOL 1000 MG/100ML IV EMUL
INTRAVENOUS | Status: AC
  Filled 2016-09-19: qty 100

## 2016-09-19 MED ORDER — ACETYLCYSTEINE 20 % IN SOLN
70.0000 mg/kg | RESPIRATORY_TRACT | Status: DC
  Filled 2016-09-19: qty 60

## 2016-09-19 NOTE — ED Notes (Signed)
CT ready, informed respiratory need to transport

## 2016-09-19 NOTE — Procedures (Signed)
Extubation Procedure Note  Patient Details:   Name: Brittany [name] DOB: 13-Jul-1965 MRN: [id number]   Airway Documentation:     Evaluation  O2 sats: stable throughout Complications: No apparent complications Patient did tolerate procedure well. Bilateral breath sounds: clear, diminished   Yes   Pt. Was extubated to a 2L [place] without any complications, dyspnea or stridor noted.  Carlynn SpryCobb, Tishana Clinkenbeard L 09/19/2016, 12:18 PM

## 2016-09-19 NOTE — H&P (Signed)
PULMONARY / CRITICAL CARE MEDICINE   Name: Brittany [name] MRN: [id number] DOB: Jul 11, 1965    ADMISSION DATE:  09/19/2016 CONSULTATION DATE: 09/19/16  REFERRING MD: Dr Judd Lienelo (ER)  CHIEF COMPLAINT: Cymbalta Overdose  HISTORY OF PRESENT ILLNESS:   51yoF with history of SZ, Migraines, Hypothyroidism, HTN, GERD, Bipolar disorder, Anxiety, HCV, and Obesity, who presents after being found face down in emesis with an empty bottle of Cymbalta nearby. Reportedly a friend said the patient had been depressed lately. Patient intubated on arrival to ER as she was not protecting her airway. No family is present at time of my exam.   PAST MEDICAL HISTORY :  She  has a past medical history of Anxiety; Bipolar 1 disorder (HCC); Bulging lumbar disc; Chronic bronchitis (HCC); Chronic lower back pain; Depression; Facial nerve injury; Facial pain (11/2013); GERD (gastroesophageal reflux disease); Goiter; Head injury; Heart murmur; Hyperlipidemia; Hypertension; Hypothyroidism; Migraine; Ruptured intervertebral disc; Seizures (HCC); and Short-term memory loss.  PAST SURGICAL HISTORY: She  has a past surgical history that includes Tonsillectomy; Abdominal hysterectomy; Cesarean section (2004); and Eye surgery (Right, 11/2013).  Allergies  Allergen Reactions  . Toradol [Ketorolac Tromethamine] Swelling and Other (See Comments)    Arms and stomach swelled  . Suboxone [Buprenorphine Hcl-Naloxone Hcl] Other (See Comments)    Reaction:  Cellulitis   . Subutex [Buprenorphine] Rash  . Tegretol [Carbamazepine] Rash  . Trazodone And Nefazodone Other (See Comments)    Reaction:  Bladder infection    No current facility-administered medications on file prior to encounter.    Current Outpatient Prescriptions on File Prior to Encounter  Medication Sig  . bacitracin ointment Apply topically 2 (two) times daily.  . butalbital-acetaminophen-caffeine (FIORICET, ESGIC) 50-325-40 MG tablet Take 1 tablet by mouth every 4  (four) hours as needed for headache or migraine.  . DULoxetine (CYMBALTA) 60 MG capsule Take 1 capsule (60 mg total) by mouth 2 (two) times daily.  Marland Kitchen. gabapentin (NEURONTIN) 400 MG capsule Take 2 capsules (800 mg total) by mouth 3 (three) times daily.  . hydrOXYzine (ATARAX/VISTARIL) 25 MG tablet Take 1 tablet (25 mg total) by mouth 3 (three) times daily as needed for anxiety (Sleep).  Marland Kitchen. ibuprofen (ADVIL,MOTRIN) 200 MG tablet Take 200 mg by mouth every 6 (six) hours as needed for headache (pain).  . Multiple Vitamin (MULTIVITAMIN WITH MINERALS) TABS tablet Take 1 tablet by mouth daily.   Marland Kitchen. topiramate (TOPAMAX) 50 MG tablet Take 1 tablet (50 mg total) by mouth 2 (two) times daily.   FAMILY HISTORY:  Her indicated that her mother is alive. She indicated that her father is deceased. She indicated that her sister is alive.   SOCIAL HISTORY: She  reports that she has been smoking.  She has never used smokeless tobacco. She reports that she uses drugs, including Marijuana. She reports that she does not drink alcohol.  REVIEW OF SYSTEMS:   Review of Systems  Unable to perform ROS: Critical illness   VITAL SIGNS: BP 98/76   Pulse 84   Temp (!) 97.2 F (36.2 C) (Tympanic)   Resp 18   Ht 5\' 6"  (1.676 m)   LMP  (LMP Unknown)   SpO2 100%   VENTILATOR SETTINGS: Vent Mode: PRVC FiO2 (%):  [100 %] 100 % Set Rate:  [18 bmp] 18 bmp Vt Set:  [470 mL] 470 mL PEEP:  [5 cmH20] 5 cmH20 Plateau Pressure:  [20 cmH20] 20 cmH20  INTAKE / OUTPUT: No intake/output data recorded.  PHYSICAL EXAMINATION: General: intubated,  sedated, critically ill Neuro: +gag, PERRL, moving all extremities HEENT: ETT in place; OP clear Cardiovascular: RRR no m/r/g Lungs: CTA b/l Abdomen: Soft NTND, BS+ Musculoskeletal: no LE edema Skin: no rashes  LABS:  BMET No results for input(s): NA, K, CL, CO2, BUN, CREATININE, GLUCOSE in the last 168 hours.  Electrolytes No results for input(s): CALCIUM, MG, PHOS in the  last 168 hours.  CBC No results for input(s): WBC, HGB, HCT, PLT in the last 168 hours.  Coag's No results for input(s): APTT, INR in the last 168 hours.  Sepsis Markers No results for input(s): LATICACIDVEN, PROCALCITON, O2SATVEN in the last 168 hours.  ABG No results for input(s): PHART, PCO2ART, PO2ART in the last 168 hours.  Liver Enzymes No results for input(s): AST, ALT, ALKPHOS, BILITOT, ALBUMIN in the last 168 hours.  Cardiac Enzymes No results for input(s): TROPONINI, PROBNP in the last 168 hours.  Glucose No results for input(s): GLUCAP in the last 168 hours.  Imaging No results found.  STUDIES:  Head and C-Spine CT's: pending  LINES/TUBES: 2-PIV's   ASSESSMENT / PLAN: 51yoF with history of SZ, Migraines, Hypothyroidism, HTN, GERD, Bipolar disorder, Anxiety, HCV, and Obesity, who presents after being found face down in emesis with an empty bottle of Cymbalta nearby.  PULMONARY 1. Acute Hypoxic Respiratory Failure: - continue mechanical ventilation - CXR on my review shows no infiltrates; ETT in correct position - obtain ABG - start GI and DVT prophylaxis  CARDIOVASCULAR 1. Hx HTN: - currently normotensive on propofol gtt; hold home antihypertensives  RENAL No acute issues  GASTROINTESTINAL 1. GERD: - start GI prophylaxis  HEMATOLOGIC No active issues   INFECTIOUS 1. Hx HCV: - no known h/o cirrhosis; check LFT's  ENDOCRINE No active issues   NEUROLOGIC 1. Hx Seizures: - unclear if patient had a seizure today or if this is a drug overdose; the empty bottle of cymbalta points more towards intentional overdose. But given the history of seizures, will obtain EEG and Neuro consult.   2. Presume Cymbalta Overdose; QT Prolongation: - found down near empty bottle of cymbalta, presumably due to an intentional cymbalta overdose - UDS, APAP, Salicylate, and Etoh level pending - Recheck QTc q6hrs - will call poison control   60 minutes critical  care time  Milana Obey, MD Pulmonary and Critical Care Medicine [facility] Pager: [phone]  09/19/2016, 5:28 AM

## 2016-09-19 NOTE — Progress Notes (Signed)
Meghan, RN spoke to poison control who stated if Acetaminophen level is negative/low and Liver Enzymes are WNL, we will not need to do the next dose of Mucamyst. Will inform CCM. Pt is complaining of face pain and is referring to it as her trigeminal neuralgia and also complaining of a "trapped" nerve. I informed patient that I had made the provider aware, but did not want to sedate patient with medicine so soon after extubation. Will make CCM aware. Thersa SaltLeeAnne T. Wyndham Santilli, RN, BSN 2:04 PM 09/19/2016

## 2016-09-19 NOTE — Procedures (Signed)
Intubation Procedure Note Brittany [name] [id number] [birth year]  Procedure: Intubation Indications: Airway protection and maintenance  Procedure Details Consent: Risks of procedure as well as the alternatives and risks of each were explained to the (patient/caregiver).  Consent for procedure obtained. Time Out: Verified patient identification, verified procedure, site/side was marked, verified correct patient position, special equipment/implants available, medications/allergies/relevent history reviewed, required imaging and test results available.  Performed  Maximum sterile technique was used including antiseptics, gloves and hand hygiene.  MAC and 4    Evaluation Hemodynamic Status: BP stable throughout; O2 sats: stable throughout Patient's Current Condition: stable Complications: No apparent complications Patient did tolerate procedure well. Chest X-ray ordered to verify placement.  CXR: pending.   [name] 09/19/2016

## 2016-09-19 NOTE — Consult Note (Signed)
Neurology Consultation  Reason for Consult: Seizures Referring Physician: Dr. Jimmey Ralph  CC: Unresponsiveness, rule out seizures  History is obtained from: Chart, primary team  HPI: Brittany [name] is a 51 y.o. female with a past medical history of anxiety, bipolar disorder, trigeminal neuralgia, migraine, seizure disorder, hypertension, hypothyroidism, hyperlipidemia who was found down in her own vomiting with a empty bottle of Cymbalta by her side. Her chart review, it is mentioned that a friend reported that she had been feeling depressed lately. She was intubated in the ER as she was unable to protect her airway. No other attendant to provide history. She has history of prior suicide attempts by drug overdose. She has been seen in the neurology clinic for presumed trigeminal neuralgia for which she is been on carbamazepine and some other medications. In the emergency room, she was noted to be very aggressive and had to be sedated using propofol. There was some question of whole body shaking around the time of intubation. Given the known history of seizures, neurology was consulted for EEG and rule out seizures.   LKW: Unknown tpa given?: no, less likely stroke Premorbid modified Rankin scale (mRS): Unknown   ROS: Unable to obtain due to altered mental status.   Past Medical History:  Diagnosis Date  . Anxiety   . Bipolar 1 disorder ([place])   . Bulging lumbar disc   . Chronic bronchitis ([place])   . Chronic lower back pain   . Depression   . Facial nerve injury   . Facial pain 11/2013   walking dog when she tripped and "faceplanted" landing on her face.   Marland Kitchen GERD (gastroesophageal reflux disease)   . Goiter   . Head injury   . Heart murmur   . Hyperlipidemia   . Hypertension   . Hypothyroidism   . Migraine    "weekly" (03/23/2015)  . Ruptured intervertebral disc   . Seizures ([place])    "I've had a few; had a grand mal when I took Neurotin; still take Neurotin" (03/23/2015)  .  Short-term memory loss     Family History  Problem Relation Age of Onset  . Migraines Mother   . Bipolar disorder Mother   . Migraines Father   . Other Father   . Parkinson's disease Father   . Migraines Sister    Social History:   reports that she has been smoking.  She has never used smokeless tobacco. She reports that she uses drugs, including Marijuana. She reports that she does not drink alcohol.  Medications  Current Facility-Administered Medications:  .  0.9 %  sodium chloride infusion, 250 mL, Intravenous, PRN, Hammonds, Sharyn Blitz, MD .  0.9 %  sodium chloride infusion, , Intravenous, Continuous, Hammonds, Sharyn Blitz, MD, Last Rate: 75 mL/hr at 09/19/16 0555 .  chlorhexidine gluconate (MEDLINE KIT) (PERIDEX) 0.12 % solution 15 mL, 15 mL, Mouth Rinse, BID, Hammonds, Sharyn Blitz, MD .  docusate (COLACE) 50 MG/5ML liquid 100 mg, 100 mg, Per Tube, BID PRN, Hammonds, Sharyn Blitz, MD .  famotidine (PEPCID) IVPB 20 mg premix, 20 mg, Intravenous, Q12H, Hammonds, Sharyn Blitz, MD .  fentaNYL (SUBLIMAZE) 100 MCG/2ML injection, , , ,  .  fentaNYL (SUBLIMAZE) injection 100 mcg, 100 mcg, Intravenous, Q15 min PRN, Hammonds, Sharyn Blitz, MD .  fentaNYL (SUBLIMAZE) injection 100 mcg, 100 mcg, Intravenous, Q2H PRN, Hammonds, Sharyn Blitz, MD .  heparin injection 5,000 Units, 5,000 Units, Subcutaneous, Q8H, Hammonds, Sharyn Blitz, MD .  MEDLINE mouth rinse, 15 mL, Mouth  Rinse, QID, Hammonds, Sharyn Blitz, MD .  midazolam (VERSED) injection 2 mg, 2 mg, Intravenous, Q15 min PRN, Hammonds, Sharyn Blitz, MD .  midazolam (VERSED) injection 2 mg, 2 mg, Intravenous, Q2H PRN, Hammonds, Sharyn Blitz, MD .  propofol (DIPRIVAN) 1000 MG/100ML infusion, , , ,  .  propofol (DIPRIVAN) 1000 MG/100ML infusion, 0-50 mcg/kg/min, Intravenous, Continuous, Hammonds, Sharyn Blitz, MD, 40 mcg/kg/min at 09/19/16 6812  Current Outpatient Prescriptions:  .  bacitracin ointment, Apply topically 2 (two) times daily., Disp: 120 g, Rfl:  0 .  butalbital-acetaminophen-caffeine (FIORICET, ESGIC) 50-325-40 MG tablet, Take 1 tablet by mouth every 4 (four) hours as needed for headache or migraine., Disp: , Rfl:  .  DULoxetine (CYMBALTA) 60 MG capsule, Take 1 capsule (60 mg total) by mouth 2 (two) times daily., Disp: 30 capsule, Rfl: 0 .  gabapentin (NEURONTIN) 400 MG capsule, Take 2 capsules (800 mg total) by mouth 3 (three) times daily., Disp: 180 capsule, Rfl: 0 .  hydrOXYzine (ATARAX/VISTARIL) 25 MG tablet, Take 1 tablet (25 mg total) by mouth 3 (three) times daily as needed for anxiety (Sleep)., Disp: 30 tablet, Rfl: 0 .  ibuprofen (ADVIL,MOTRIN) 200 MG tablet, Take 200 mg by mouth every 6 (six) hours as needed for headache (pain)., Disp: , Rfl:  .  Multiple Vitamin (MULTIVITAMIN WITH MINERALS) TABS tablet, Take 1 tablet by mouth daily. , Disp: , Rfl:  .  topiramate (TOPAMAX) 50 MG tablet, Take 1 tablet (50 mg total) by mouth 2 (two) times daily., Disp: 60 tablet, Rfl: 0  Exam: Current vital signs: BP (!) 148/105   Pulse 78   Temp (!) 97.2 F (36.2 C) (Tympanic)   Resp 18   Ht _0  (1.676 m)   LMP  (LMP Unknown)   SpO2 100%  Vital signs in last 24 hours: Temp:  [97.2 F (36.2 C)] 97.2 F (36.2 C) (08/07 0454) Pulse Rate:  [78-95] 78 (08/07 0600) Resp:  [18-22] 18 (08/07 0600) BP: (98-151)/(76-107) 148/105 (08/07 0600) SpO2:  [95 %-100 %] 100 % (08/07 0600) FiO2 (%):  [100 %] 100 % (08/07 0508) Sedated on propofol. Intubated. Breathing over the vent Pupils 5 mm, minimally reactive area Corneals present bilaterally Cough present Gag present Minimal withdrawal to noxious stimulus on the left lower extremity. Flexion right lower extremity No response to noxious stimulus in the bilateral upper extremities.  Labs I have reviewed labs in epic and the results pertinent to this consultation are:  CBC    Component Value Date/Time   WBC 10.8 (H) 05/14/2016 2116   RBC 3.50 (L) 05/14/2016 2116   HGB 10.5 (L)  05/14/2016 2116   HGB 13.4 08/20/2011 2347   HCT 32.6 (L) 05/14/2016 2116   HCT 38.7 08/20/2011 2347   PLT 268 05/14/2016 2116   PLT 269 08/20/2011 2347   MCV 93.1 05/14/2016 2116   MCV 95 08/20/2011 2347   MCH 30.0 05/14/2016 2116   MCHC 32.2 05/14/2016 2116   RDW 14.0 05/14/2016 2116   RDW 13.8 08/20/2011 2347   LYMPHSABS 1.7 04/14/2016 1642   MONOABS 0.7 04/14/2016 1642   EOSABS 0.2 04/14/2016 1642   BASOSABS 0.0 04/14/2016 1642    CMP     Component Value Date/Time   NA 141 05/14/2016 2116   NA 142 08/23/2011 0633   K 3.6 05/14/2016 2116   K 4.3 08/23/2011 0633   CL 105 05/14/2016 2116   CL 106 08/23/2011 0633   CO2 25 05/14/2016 2116   CO2 25  08/23/2011 0633   GLUCOSE 83 05/14/2016 2116   GLUCOSE 100 (H) 08/23/2011 0633   BUN 10 05/14/2016 2116   BUN 11 08/23/2011 0633   CREATININE 0.96 05/14/2016 2116   CREATININE 0.56 (L) 08/23/2011 0633   CALCIUM 9.4 05/14/2016 2116   CALCIUM 8.8 08/23/2011 0633   PROT 7.2 05/14/2016 2116   PROT 8.1 08/20/2011 2347   ALBUMIN 3.9 05/14/2016 2116   ALBUMIN 4.1 08/20/2011 2347   AST 27 05/14/2016 2116   AST 22 08/20/2011 2347   ALT 23 05/14/2016 2116   ALT 25 08/20/2011 2347   ALKPHOS 75 05/14/2016 2116   ALKPHOS 69 08/20/2011 2347   BILITOT 0.7 05/14/2016 2116   BILITOT 0.4 08/20/2011 2347   GFRNONAA >60 05/14/2016 2116   GFRNONAA >60 08/23/2011 0633   GFRAA >60 05/14/2016 2116   GFRAA >60 08/23/2011 0633    Lipid Panel     Component Value Date/Time   CHOL 199 04/30/2015 0630   TRIG 386 (H) 08/02/2015 1716   HDL 41 04/30/2015 0630   CHOLHDL 4.9 04/30/2015 0630   VLDL 53 (H) 04/30/2015 0630   LDLCALC 105 (H) 04/30/2015 0630     Imaging I have reviewed the images obtained:Noncontrast CT of the head shows no acute changes. Motion artifact.   Assessment:  50 year old woman past medical history of anxiety, bipolar, trigeminal neuralgia, migraine, seizures, hypertension, hypothyroidism, hyperlipidemia found down  in her old vomit with a empty bottle of Cymbalta by her side-presumed suicide attempt with Cymbalta overdose. Some report of full body shaking around the time of intubation. Since intubation has been heavily sedated. Concern for underlying seizures. Neurology consulted for EEG and seizures.  Impression: Altered mental status-presumed Cymbalta overdose History of seizures History of migraines History of trigeminal neuralgia History of anxiety and bipolar disorder   Recommendations: We will obtain a stat EEG. We'll inform the EEG technologist team. Continue home dose of Topamax 50 twice a day. Unclear if she had seizure-like activity versus any myoclonic movements. Gabapentin can worsen the myoclonus. We will recommend avoiding gabapentin for now. Recommendations and antiepileptics based on EEG results. Should obtain an MRI of the brain when patient is stable and able to go for MRI. We will follow with you  Amie Portland, MD [facility] [phone]  If 7pm to 7am, please call on call as listed on AMION.

## 2016-09-19 NOTE — Procedures (Signed)
HPI:  51 y/o with history of seizure, MS change and possible drug overdose  TECHNICAL SUMMARY:  A multichannel referential and bipolar montage EEG using the standard international 10-20 system was performed on the patient described as sedated.  There is 10-11 Hz activity noted throughout all head regions.  Faster and excessive beta is noted frontally.  Intermittent 6-7 Hz theta is noted throughout the recording.    ACTIVATION:  Stepwise photic stimulation and hyperventilation are not performed.  EPILEPTIFORM ACTIVITY:  There were no spikes, sharp waves or paroxysmal activity.  SLEEP:  No sleep architecture is noted.  IMPRESSION:  This EEG demonstrated a mixture of fast and slow activity, which is generally consistent with medication effect.  There was no epileptiform activity recorded on this EEG.  This does not rule out the diagnosis of a seizure disorder.  Correlate clinically.

## 2016-09-19 NOTE — Progress Notes (Signed)
Pt insists on calling lawyer, Lianne Cureamakio Gause, or she "will be arrested" for not showing up for court. RN clarified with patient need to call so she is not "immediatly arrested". Pt given phone. She left message for lawyer. Thersa SaltLeeAnne T. Gideon Burstein, RN, BSN 4:04 PM 09/19/2016

## 2016-09-19 NOTE — ED Provider Notes (Addendum)
[facility]-EMERGENCY DEPT Provider Note   CSN: [id number] Arrival date & time: 09/19/16  0450     History   Chief Complaint Chief Complaint  Patient presents with  . Loss of Consciousness    HPI Ronnesha Mester is a 51 y.o. female.  Patient is a 51 year old female brought by EMS for evaluation of altered mental status. Her significant other called 911 after he found her face down on the floor in vomit. EMS placed a nasal trumpet and the patient was transported here. She is obtunded and offers no additional history. Per EMS, there was an empty bottle of Cymbalta which was just filled a few days ago at her side. EMS also noted small beads in her vomit which appeared to be the contents of the Cymbalta capsules.   The history is provided by the patient.  Loss of Consciousness   This is a new problem. The current episode started less than 1 hour ago. The problem occurs constantly. The problem has not changed since onset.She lost consciousness for a period of greater than 5 minutes.    Past Medical History:  Diagnosis Date  . Anxiety   . Bipolar 1 disorder (HCC)   . Bulging lumbar disc   . Chronic bronchitis (HCC)   . Chronic lower back pain   . Depression   . Facial nerve injury   . Facial pain 11/2013   walking dog when she tripped and "faceplanted" landing on her face.   Marland Kitchen GERD (gastroesophageal reflux disease)   . Goiter   . Head injury   . Heart murmur   . Hyperlipidemia   . Hypertension   . Hypothyroidism   . Migraine    "weekly" (03/23/2015)  . Ruptured intervertebral disc   . Seizures (HCC)    "I've had a few; had a grand mal when I took Neurotin; still take Neurotin" (03/23/2015)  . Short-term memory loss     Patient Active Problem List   Diagnosis Date Noted  . Bipolar I disorder, most recent episode depressed (HCC) 05/16/2016  . History of trigeminal neuralgia 05/16/2016  . Benzodiazepine dependence (HCC) 05/16/2016  . Alcohol use disorder, moderate, dependence  (HCC) 05/16/2016  . Positive hepatitis C antibody test 12/24/2015  . Bipolar 1 disorder (HCC) 12/23/2015  . Trigeminal neuralgia 12/23/2015  . Overdose 12/22/2015  . Baclofen overdose 10/28/2015  . Severe episode of recurrent major depressive disorder, without psychotic features (HCC)   . Acute respiratory failure with hypoxemia (HCC)   . Bipolar I disorder, most recent episode mixed (HCC) 06/14/2015  . Essential hypertension   . Sinus bradycardia 06/10/2015  . Prolonged QT interval 05/03/2015  . Morbid obesity due to excess calories (HCC)   . GERD (gastroesophageal reflux disease)   . Migraine without aura and without status migrainosus, not intractable   . Overdose 03/23/2015  . History of hepatitis C virus infection 02/26/2015  . Facial nerve injury   . Right facial pain 01/05/2015  . Polypharmacy 01/18/2012    Class: Chronic  . Borderline personality disorder 01/18/2012  . Hypothyroidism 09/16/2011    Past Surgical History:  Procedure Laterality Date  . ABDOMINAL HYSTERECTOMY    . CESAREAN SECTION  2004  . EYE SURGERY Right 11/2013   emergent lateral canthotomy due to proptosis  [name] 11/30/2013  . TONSILLECTOMY      OB History    No data available       Home Medications    Prior to Admission medications   Medication Sig  Start Date End Date Taking? Authorizing Provider  bacitracin ointment Apply topically 2 (two) times daily. 05/20/16   Rankin, Shuvon B, NP  butalbital-acetaminophen-caffeine (FIORICET, ESGIC) 50-325-40 MG tablet Take 1 tablet by mouth every 4 (four) hours as needed for headache or migraine.    [provider]  DULoxetine (CYMBALTA) 60 MG capsule Take 1 capsule (60 mg total) by mouth 2 (two) times daily. 05/20/16   Rankin, Shuvon B, NP  gabapentin (NEURONTIN) 400 MG capsule Take 2 capsules (800 mg total) by mouth 3 (three) times daily. 05/20/16   Rankin, Shuvon B, NP  hydrOXYzine (ATARAX/VISTARIL) 25 MG tablet Take 1 tablet (25 mg total) by  mouth 3 (three) times daily as needed for anxiety (Sleep). 05/20/16   Rankin, Shuvon B, NP  ibuprofen (ADVIL,MOTRIN) 200 MG tablet Take 200 mg by mouth every 6 (six) hours as needed for headache (pain).    [provider]  Multiple Vitamin (MULTIVITAMIN WITH MINERALS) TABS tablet Take 1 tablet by mouth daily.     [provider]  topiramate (TOPAMAX) 50 MG tablet Take 1 tablet (50 mg total) by mouth 2 (two) times daily. 05/20/16   Rankin, Rada HayShuvon B, NP    Family History Family History  Problem Relation Age of Onset  . Migraines Mother   . Bipolar disorder Mother   . Migraines Father   . Other Father   . Parkinson's disease Father   . Migraines Sister     Social History Social History  Substance Use Topics  . Smoking status: Current Every Day Smoker  . Smokeless tobacco: Never Used  . Alcohol use No     Comment: 03/23/2015 "might drink at Christmastime"     Allergies   Toradol [ketorolac tromethamine]; Suboxone [buprenorphine hcl-naloxone hcl]; Subutex [buprenorphine]; Tegretol [carbamazepine]; and Trazodone and nefazodone   Review of Systems Review of Systems  Unable to perform ROS: Acuity of condition  Cardiovascular: Positive for syncope.     Physical Exam Updated Vital Signs Pulse 95   Temp (!) 97.2 F (36.2 C) (Tympanic)   Resp (!) 22   LMP  (LMP Unknown)   SpO2 95%   Physical Exam  Constitutional: She appears well-developed and well-nourished. No distress.  Patient appears pale and acutely ill. She is obtunded.  HENT:  Head: Normocephalic and atraumatic.  Neck: Normal range of motion. Neck supple.  Cardiovascular: Normal rate and regular rhythm.  Exam reveals no gallop and no friction rub.   No murmur heard. Pulmonary/Chest: Effort normal and breath sounds normal. No respiratory distress. She has no wheezes.  Abdominal: Soft. Bowel sounds are normal. She exhibits no distension. There is no tenderness.  Musculoskeletal: Normal range of motion.    Neurological:  Patient is obtunded and minimally responsive. She will withdraw from noxious stimuli, however will not open her eyes or speak. She will make the occasional moaning or groaning.  Skin: Skin is warm and dry. She is not diaphoretic.  Nursing note and vitals reviewed.    ED Treatments / Results  Labs (all labs ordered are listed, but only abnormal results are displayed) Labs Reviewed  COMPREHENSIVE METABOLIC PANEL  ETHANOL  CBC WITH DIFFERENTIAL/PLATELET  ACETAMINOPHEN LEVEL  SALICYLATE LEVEL  URINALYSIS, ROUTINE W REFLEX MICROSCOPIC  RAPID URINE DRUG SCREEN, HOSP PERFORMED  BLOOD GAS, ARTERIAL  I-STAT TROPONIN, ED  I-STAT CG4 LACTIC ACID, ED    EKG  EKG Interpretation None       Radiology No results found.  Procedures Procedures (including critical care  time)  Medications Ordered in ED Medications  succinylcholine (ANECTINE) injection (100 mg Intravenous Given 09/19/16 0457)  etomidate (AMIDATE) injection (20 mg Intravenous Given 09/19/16 0457)     Initial Impression / Assessment and Plan / ED Course  I have reviewed the triage vital signs and the nursing notes.  Pertinent labs & imaging results that were available during my care of the patient were reviewed by me and considered in my medical decision making (see chart for details).  Patient with an apparent overdose of Cymbalta. She arrives obtunded, vomiting, and not protecting her airway. GCS was initially 6-7. For this reason, RSI was performed and the patient was intubated.  INTUBATION Performed by: Geoffery Lyons  Required items: required blood products, implants, devices, and special equipment available Patient identity confirmed: provided demographic data and hospital-assigned identification number Time out: Immediately prior to procedure a "time out" was called to verify the correct patient, procedure, equipment, support staff and site/side marked as required.  Indications:  Overdose/respiratory failure   Intubation method: Glidescope Laryngoscopy   Preoxygenation: BVM  Sedatives: 20 mg Etomidate Paralytic: 150 mg Succinylcholine  Tube Size: 7.5 cuffed  Post-procedure assessment: chest rise and ETCO2 monitor Breath sounds: equal and absent over the epigastrium Tube secured with: ETT holder Chest x-ray interpreted by radiologist and me.  Chest x-ray findings: endotracheal tube in appropriate position  Patient tolerated the procedure well with no immediate complications.  The patient will be admitted to the critical care service for further evaluation and treatment.  CRITICAL CARE Performed by: Geoffery Lyons Total critical care time: 45 minutes Critical care time was exclusive of separately billable procedures and treating other patients. Critical care was necessary to treat or prevent imminent or life-threatening deterioration. Critical care was time spent personally by me on the following activities: development of treatment plan with patient and/or surrogate as well as nursing, discussions with consultants, evaluation of patient's response to treatment, examination of patient, obtaining history from patient or surrogate, ordering and performing treatments and interventions, ordering and review of laboratory studies, ordering and review of radiographic studies, pulse oximetry and re-evaluation of patient's condition.    Final Clinical Impressions(s) / ED Diagnoses   Final diagnoses:  None    New Prescriptions New Prescriptions   No medications on file     Geoffery Lyons, MD 09/19/16 2956    Geoffery Lyons, MD 09/19/16 [phone]

## 2016-09-19 NOTE — Progress Notes (Signed)
S:  RN reports pt alert, following commands.  No acute events.    O:  Vitals:   09/19/16 0645 09/19/16 0700 09/19/16 0715 09/19/16 0811  BP: 127/86 130/90 122/84 116/79  Pulse: 78 81 80 86  Resp: 16 18 18 19   Temp: 98.2 F (36.8 C)   98.1 F (36.7 C)  TempSrc: Oral   Axillary  SpO2: 99% 97% 96% 97%  Weight:      Height:        Recent Labs Lab 09/19/16 0520  HGB 11.3*  HCT 34.2*  WBC 7.9  PLT 257    Recent Labs Lab 09/19/16 0520  NA 136  K 3.7  CL 104  CO2 24  GLUCOSE 107*  BUN 15  CREATININE 0.89  CALCIUM 8.8*  MG 1.7  PHOS 3.0    General: adult female in NAD on vent HEENT: MM pink/moist, ETT Neuro: Awakens to voice, follows commands  CV: s1s2 rrr, no m/r/g PULM: even/non-labored, lungs bilaterally clear  WU:JWJXGI:soft, non-tender, bsx4 active  Extremities: warm/dry, no edema  Skin: no rashes or lesions  STUDIES: CT Head 8/7 >> no evidence of traumatic intracranial injury or fracture CT Cervical Spine 8/7 >> no evidence of fracture or subluxation along the cervical spine, R apical airspace opacity  UDS 8/7 >>  MRI 8/7 >>   CULTURES:  UA 8/7 >> Sputum 8/7 >>   A: Intentional Overdose - presumed Cymbalta, with prolonged QT Depression  Acute Hypoxic Respiratory Failure  Hx HTN GERD  Hx HCV Hx Seizures   P: Await EEG  Serial neuro exams Continue vent support > PRVC  Daily WUA / SBT as mental status permits  Minimize sedation  Goal RASS 0 to -1  Continue home Topamax  Hold gabapentin as may worsen myoclonus  Await MRI brain  Neurology following, appreciate input  K, Mg replacement  Repeat tylenol level, LFT's now Follow up EKG, labs, CXR in am  Begin N-acetylcysteine PO  * Followed up with Poison Control > expect to see with Cymbalta CNS depression (drowsiness to coma), tachycardia, HTN, seizures and death seen as low as 1000mg . Should not see QTc changes seen in literature but can see SVT, hypotension.   Minimum observation time 6 hours >  now must wait for her to return to baseline.  Given elevated acetaminophen level, consider N-acetylcysteine.  Repeat LFT's, acetaminophen now > need to see negative acetaminophen to d/c.  Optimize K/Mg to high normal.     Additional CC Time: 30 minutes  Canary BrimBrandi Lucilla Petrenko, NP-C [facility] Pgr: [phone] or if no answer [phone] 09/19/2016, 9:23 AM

## 2016-09-19 NOTE — ED Triage Notes (Signed)
Pt last seen at 2am, around 4 she was found face down in emesis w/ an empty bottle of symbolta (60 pills).  Friend reports she has been depressed lately.  She is able to move all extremities.

## 2016-09-19 NOTE — Progress Notes (Signed)
EEG completed, results pending. 

## 2016-09-19 NOTE — Care Management Note (Signed)
Case Management Note  Patient Details  Name: Brittany [name] MRN: [id number] Date of Birth: 1965-10-08  Subjective/Objective:   Pt admitted on 09/19/16 s/p intentional Rx drug overdose with hypoxic respiratory failure.  PTA, pt independent of ADLS; has fiance and daughter.                   Action/Plan: Pt extubated today.  Psych evaluation pending.  Suicide sitter remains at bedside.  Will follow for discharge planning as pt progresses.    Expected Discharge Date:                  Expected Discharge Plan:     In-House Referral:  Clinical Social Work  Discharge planning Services  CM Consult  Post Acute Care Choice:    Choice offered to:     DME Arranged:    DME Agency:     HH Arranged:    HH Agency:     Status of Service:  In process, will continue to follow  If discussed at Long Length of Stay Meetings, dates discussed:    Additional Comments:  Quintella BatonJulie W. Baxter Gonzalez, RN, BSN  Trauma/Neuro ICU Case Manager [phone]

## 2016-09-19 NOTE — Progress Notes (Signed)
Pt displaying manipulative behavior with Suicide sitter and nursing staff. Pt is lying to different staff members about when she did different things and about what other staff members have said.  Thersa SaltLeeAnne T. Trung Wenzl, RN, BSN 3:35 PM 09/19/2016

## 2016-09-19 NOTE — Progress Notes (Addendum)
S:  Assessment post extubation.  Pt requesting pain medications for "trigeminal neuralgia"  O:  Patient alert, awake.  Speech clear, MAE, normal strength.  Sitter at bedside. Normal respiratory effort.  2L O2 without distress.   A: Intentional overdose  Acute Hypoxic Respiratory Failure - s/p extubation   P: Continue O2 Minimize sedating medications as able  NPO   Pulmonary hygiene- IS, mobilize Aspiration precautions Hold home medications until seen by PSY [facility]  Canary BrimBrandi Jaque Dacy, NP-C [facility] Pgr: [phone] or if no answer [phone] 09/19/2016, 1:53 PM

## 2016-09-19 NOTE — Progress Notes (Signed)
Pt is insistent on calling daughter to let her know she is in the hospital. RN and suicide sitter could not find number in the chart. Pt attempted to remember number, but quoted 12 different numbers to this RN every time. Attempted to contact patient's fiance to get number but was unable to make contact. Informed patient we will try again later. Thersa SaltLeeAnne T. Kalissa Grays, RN, BSN 2:55 PM 09/19/2016

## 2016-09-19 NOTE — Progress Notes (Signed)
Pharmacy Note - N-acetylcysteine   Brittany [name] is a 51 y.o. female admitted on 09/19/2016 with overdose. Acetaminophen level was 23 with morning labs. Poison Control contacted at [phone] and recommended to empirically start NAC. Dosingrecommendations below provided by poison control. Will repeat AST/ALT level following the last dose of NAC. This morning AST 35/ ALT 26   Plan: NAC 140 mg/kg loading, then 70 mg/kg every 4 hours for 5 doses F/U liver function tomorrow after final dose   Height: 5\' 6"  (167.6 cm) Weight: 207 lb 3.7 oz (94 kg) (05/2016) IBW/kg (Calculated) : 59.3  Temp (24hrs), Avg:97.8 F (36.6 C), Min:97.2 F (36.2 C), Max:98.2 F (36.8 C)  Thank you for allowing pharmacy to be a part of this patient's care.  Toniann Failony L Voyd Groft 09/19/2016 11:23 AM

## 2016-09-19 NOTE — Progress Notes (Signed)
No further events. She is waking up.   Pupils are large, left pupil is poorly reactive, though the patient states that she has trouble seeing out of it at baseline.  This time, it appears that she had an event of unclear etiology at the time of intubation in the setting of intentional overdose. I would not favor long-term antiepileptic use. With her coming around, no further recommendations at this time. Please call with any further questions or concerns.  Ritta SlotMcNeill Kenza Munar, MD [facility] [phone]  If 7pm- 7am, please page neurology on call as listed in AMION.

## 2016-09-19 NOTE — Progress Notes (Signed)
Patient transported from ED to CT and then to 4N20 with no complications.

## 2016-09-19 NOTE — Progress Notes (Signed)
While giving patient medicines (1030) after patient became alert and able to respond, this RN asked ifpatient attempted to commit suicide last night to which she nodded. RN asked if patient was still feeling suicidal and patient nodded. RN asked if patient is/was feeling hopeless and patient nodded. Charge [facility] and [facility] notified need for suicide precautions and sitter. Sitter arrived within 30 minutes. Room was cleared of unnecessary cords, plastic trash bens, outlets were covered. Necessary cords were taped or zipped tied. Room assessment was completed by this RN, Meghan, RN, and Suicide Sitter.  Pt is calm, but occasionally pulls at restraints (neccessary due to ETT tube). Pt is still intubated and on weaning protocol. Pt is alert and appears reasonably oriented.  Will continue to monitor. Thersa SaltLeeAnne T. Kaelie Henigan, RN, BSN

## 2016-09-20 ENCOUNTER — Encounter (HOSPITAL_COMMUNITY): Payer: Self-pay | Admitting: *Deleted

## 2016-09-20 ENCOUNTER — Inpatient Hospital Stay (HOSPITAL_COMMUNITY): Payer: Medicaid Other

## 2016-09-20 DIAGNOSIS — Z818 Family history of other mental and behavioral disorders: Secondary | ICD-10-CM

## 2016-09-20 DIAGNOSIS — G500 Trigeminal neuralgia: Secondary | ICD-10-CM

## 2016-09-20 DIAGNOSIS — T1491XA Suicide attempt, initial encounter: Secondary | ICD-10-CM

## 2016-09-20 DIAGNOSIS — F1410 Cocaine abuse, uncomplicated: Secondary | ICD-10-CM

## 2016-09-20 DIAGNOSIS — T50902S Poisoning by unspecified drugs, medicaments and biological substances, intentional self-harm, sequela: Secondary | ICD-10-CM

## 2016-09-20 DIAGNOSIS — F3130 Bipolar disorder, current episode depressed, mild or moderate severity, unspecified: Secondary | ICD-10-CM

## 2016-09-20 DIAGNOSIS — F1310 Sedative, hypnotic or anxiolytic abuse, uncomplicated: Secondary | ICD-10-CM

## 2016-09-20 DIAGNOSIS — T43212A Poisoning by selective serotonin and norepinephrine reuptake inhibitors, intentional self-harm, initial encounter: Principal | ICD-10-CM

## 2016-09-20 DIAGNOSIS — F17210 Nicotine dependence, cigarettes, uncomplicated: Secondary | ICD-10-CM

## 2016-09-20 DIAGNOSIS — F1011 Alcohol abuse, in remission: Secondary | ICD-10-CM

## 2016-09-20 DIAGNOSIS — F1211 Cannabis abuse, in remission: Secondary | ICD-10-CM

## 2016-09-20 DIAGNOSIS — F339 Major depressive disorder, recurrent, unspecified: Secondary | ICD-10-CM

## 2016-09-20 LAB — COMPREHENSIVE METABOLIC PANEL
ALBUMIN: 3.4 g/dL — AB (ref 3.5–5.0)
ALT: 19 U/L (ref 14–54)
ANION GAP: 6 (ref 5–15)
AST: 21 U/L (ref 15–41)
Alkaline Phosphatase: 52 U/L (ref 38–126)
BILIRUBIN TOTAL: 0.7 mg/dL (ref 0.3–1.2)
BUN: 6 mg/dL (ref 6–20)
CHLORIDE: 109 mmol/L (ref 101–111)
CO2: 23 mmol/L (ref 22–32)
Calcium: 7.8 mg/dL — ABNORMAL LOW (ref 8.9–10.3)
Creatinine, Ser: 0.72 mg/dL (ref 0.44–1.00)
GFR calc Af Amer: >60 mL/min (ref >60)
GFR calc non Af Amer: >60 mL/min (ref >60)
GLUCOSE: 82 mg/dL (ref 65–99)
POTASSIUM: 3.3 mmol/L — AB (ref 3.5–5.1)
Sodium: 138 mmol/L (ref 135–145)
TOTAL PROTEIN: 6.6 g/dL (ref 6.5–8.1)

## 2016-09-20 LAB — CBC
HEMATOCRIT: 27.5 % — AB (ref 36.0–46.0)
HEMOGLOBIN: 9 g/dL — AB (ref 12.0–15.0)
MCH: 31.8 pg (ref 26.0–34.0)
MCHC: 32.7 g/dL (ref 30.0–36.0)
MCV: 97.2 fL (ref 78.0–100.0)
Platelets: 209 10*3/uL (ref 150–400)
RBC: 2.83 MIL/uL — ABNORMAL LOW (ref 3.87–5.11)
RDW: 13.8 % (ref 11.5–15.5)
WBC: 12.2 10*3/uL — AB (ref 4.0–10.5)

## 2016-09-20 LAB — PHOSPHORUS: PHOSPHORUS: 2.4 mg/dL — AB (ref 2.5–4.6)

## 2016-09-20 LAB — BLOOD GAS, ARTERIAL
Acid-base deficit: 0.5 mmol/L (ref 0.0–2.0)
Bicarbonate: 24 mmol/L (ref 20.0–28.0)
Drawn by: 418751
O2 Content: 28 L/min
O2 Saturation: 97.3 %
PATIENT TEMPERATURE: 98.6
pCO2 arterial: 41.9 mmHg (ref 32.0–48.0)
pH, Arterial: 7.376 (ref 7.350–7.450)
pO2, Arterial: 100 mmHg (ref 83.0–108.0)

## 2016-09-20 LAB — MAGNESIUM: MAGNESIUM: 2.1 mg/dL (ref 1.7–2.4)

## 2016-09-20 LAB — PROCALCITONIN

## 2016-09-20 MED ORDER — OXYCODONE HCL 5 MG PO TABS
10.0000 mg | ORAL_TABLET | Freq: Four times a day (QID) | ORAL | Status: DC | PRN
  Administered 2016-09-20 – 2016-09-21 (×4): 10 mg via ORAL
  Filled 2016-09-20 (×4): qty 2

## 2016-09-20 MED ORDER — FAMOTIDINE 20 MG PO TABS
20.0000 mg | ORAL_TABLET | Freq: Two times a day (BID) | ORAL | Status: DC
  Administered 2016-09-20 – 2016-09-21 (×2): 20 mg via ORAL
  Filled 2016-09-20 (×2): qty 1

## 2016-09-20 MED ORDER — POTASSIUM CHLORIDE CRYS ER 20 MEQ PO TBCR
20.0000 meq | EXTENDED_RELEASE_TABLET | ORAL | Status: AC
  Administered 2016-09-20 (×2): 20 meq via ORAL
  Filled 2016-09-20 (×2): qty 1

## 2016-09-20 NOTE — Progress Notes (Signed)
Pt c/o chronic right wrist pain states, "I have ulnar entrapment"  Pt screams out and appears to cry.  However, pt completely goes back to normal affect for a few minutes to talk about her pain meds and no tears noted.  Notified Dr. Gonzella Lexhungel again who is waiting on patients fiance to bring patients ID in to clarify since her name at her pharmacy is [name] and her name here is Brittany [name].  Explained all of this to patient.

## 2016-09-20 NOTE — Progress Notes (Signed)
Notified pharmacy of pt's med list being on pt's paper chart.  Tech to come and enter in PTA meds.

## 2016-09-20 NOTE — Plan of Care (Signed)
Problem: Safety: Goal: Ability to remain free from injury will improve Outcome: Progressing Pt has a sitter at bedside to maintain safety.  Comments: Problem: Safety: Goal: Ability to remain free from injury will improve Outcome: Progressing Pt has a sitter at bedside to maintain safety.

## 2016-09-20 NOTE — Consult Note (Signed)
[place] [facility] Consult   Reason for Consult:  Intentional drug overdose Referring Physician:  Dr. Clementeen Graham Patient Identification: Brittany [name] MRN:  [id number] Principal Diagnosis: Bipolar I disorder, most recent episode mixed [facility]) Diagnosis:   Patient Active Problem List   Diagnosis Date Noted  . Bipolar I disorder, most recent episode depressed ([place]) [F31.30] 05/16/2016  . History of trigeminal neuralgia [Z86.69] 05/16/2016  . Benzodiazepine dependence ([place]) [F13.20] 05/16/2016  . Alcohol use disorder, moderate, dependence ([place]) [F10.20] 05/16/2016  . Positive hepatitis C antibody test [R76.8] 12/24/2015  . Bipolar 1 disorder ([place]) [F31.9] 12/23/2015  . Trigeminal neuralgia [G50.0] 12/23/2015  . Overdose [T50.901A] 12/22/2015  . Baclofen overdose [T42.8X1A] 10/28/2015  . Severe episode of recurrent major depressive disorder, without psychotic features ([place]) [F33.2]   . Acute respiratory failure with hypoxemia ([place]) [J96.01]   . Bipolar I disorder, most recent episode mixed ([place]) [F31.60] 06/14/2015  . Essential hypertension [I10]   . Sinus bradycardia [R00.1] 06/10/2015  . Prolonged QT interval [R94.31] 05/03/2015  . Morbid obesity due to excess calories ([place]) [E66.01]   . GERD (gastroesophageal reflux disease) [K21.9]   . Migraine without aura and without status migrainosus, not intractable [G43.009]   . Overdose [T50.901A] 03/23/2015  . History of hepatitis C virus infection [Z86.19] 02/26/2015  . Acute encephalopathy [G93.40] 02/24/2015  . 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 Right facial pain [R51] 01/05/2015  . Polypharmacy [Z79.899] 01/18/2012    Class: Chronic  . Borderline personality disorder [F60.3] 01/18/2012  . Hypothyroidism [E03.9] 09/16/2011    Total Time spent with patient: 1 hour  Subjective:   Brittany [name] is a 51 y.o. female patient admitted with intentional overdose.  HPI:  Brittany [name] is a 51 years old female  admitted to [facility] hospital after status post intentional drug overdose and found face down in emesis with an empty bottle of Cymbalta. Patient endorses talking with her mother on the phone which caused her sad and depressed and made a plan to kill herself, wrote down list of phone numbers and told her fiancee' to contact all the people if something happen to her and also called her daughter to say good bye. She has multiple psychosomatic symptoms like seizures, Migraines, Hypothyroidism, HTN, GERD, Bipolar disorder, Anxiety, HCV, and Obesity. Patient intubated on arrival to ER as she was not protecting her airway.She was recently extubated and able to participate in this evaluation. Patient and her fiance' agree for voluntary psych admission. She has history of multiple psych admission and past suicide attempts.    Past Psychiatric History: Bipolar disorder, polysubstance abuse (alcohol, marijuana, cocaine and benzo's.) and multiple acute psych admission at [facility] and last admission was April 2018. She has been receiving medication therapy from Dr. Herbert Seta at [facility] center.    Risk to Self: Is patient at risk for suicide?: Yes Risk to Others:   Prior Inpatient Therapy:   Prior Outpatient Therapy:    Past Medical History:  Past Medical History:  Diagnosis Date  . Anxiety   . Bipolar 1 disorder ([place])   . Bulging lumbar disc   . Chronic bronchitis ([place])   . Chronic lower back pain   . Depression   . Facial nerve injury   . Facial pain 11/2013   walking dog when she tripped and "faceplanted" landing on her face.   Marland Kitchen GERD (gastroesophageal reflux disease)   . Goiter   . Head injury   . Heart murmur   . Hyperlipidemia   .  Hypertension   . Hypothyroidism   . Migraine    "weekly" (03/23/2015)  . Ruptured intervertebral disc   . Seizures ([place])    "I've had a few; had a grand mal when I took Neurotin; still take Neurotin" (03/23/2015)  . Short-term memory loss     Past Surgical  History:  Procedure Laterality Date  . ABDOMINAL HYSTERECTOMY    . CESAREAN SECTION  2004  . EYE SURGERY Right 11/2013   emergent lateral canthotomy due to proptosis  Archie Endo 11/30/2013  . TONSILLECTOMY     Family History:  Family History  Problem Relation Age of Onset  . Migraines Mother   . Bipolar disorder Mother   . Migraines Father   . Other Father   . Parkinson's disease Father   . Migraines Sister    Family Psychiatric  History: None reported. She lives with fiance x 5 years. Social History:  History  Alcohol Use No    Comment: 03/23/2015 "might drink at Christmastime"     History  Drug Use  . Types: Marijuana    Comment: 03/23/2015 ""haven't smoked marijuana for years"    Social History   Social History  . Marital status: Single    Spouse name: N/A  . Number of children: 1  . Years of education: 40   Occupational History  . Unemployed    Social History Main Topics  . Smoking status: Current Every Day Smoker  . Smokeless tobacco: Never Used  . Alcohol use No     Comment: 03/23/2015 "might drink at Christmastime"  . Drug use: Yes    Types: Marijuana     Comment: 03/23/2015 ""haven't smoked marijuana for years"  . Sexual activity: Yes    Birth control/ protection: Condom   Other Topics Concern  . None   Social History Narrative   ** Merged History Encounter **       Lives at home with her fiancee. Right-handed. No caffeine use.   Additional Social History:    Allergies:   Allergies  Allergen Reactions  . Toradol [Ketorolac Tromethamine] Swelling and Other (See Comments)    Arms and stomach swelled  . Suboxone [Buprenorphine Hcl-Naloxone Hcl] Other (See Comments)    Reaction:  Cellulitis   . Subutex [Buprenorphine] Rash  . Tegretol [Carbamazepine] Rash  . Trazodone And Nefazodone Other (See Comments)    Reaction:  Bladder infection    Labs:  Results for orders placed or performed during the hospital encounter of 09/19/16 (from the past 48  hour(s))  Comprehensive metabolic panel     Status: Abnormal   Collection Time: 09/19/16  5:20 AM  Result Value Ref Range   Sodium 136 135 - 145 mmol/L   Potassium 3.7 3.5 - 5.1 mmol/L   Chloride 104 101 - 111 mmol/L   CO2 24 22 - 32 mmol/L   Glucose, Bld 107 (H) 65 - 99 mg/dL   BUN 15 6 - 20 mg/dL   Creatinine, Ser 0.89 0.44 - 1.00 mg/dL   Calcium 8.8 (L) 8.9 - 10.3 mg/dL   Total Protein 7.9 6.5 - 8.1 g/dL   Albumin 4.5 3.5 - 5.0 g/dL   AST 35 15 - 41 U/L   ALT 26 14 - 54 U/L   Alkaline Phosphatase 59 38 - 126 U/L   Total Bilirubin 0.7 0.3 - 1.2 mg/dL   GFR calc non Af Amer >60 >60 mL/min   GFR calc Af Amer >60 >60 mL/min    Comment: (NOTE) The  eGFR has been calculated using the CKD EPI equation. This calculation has not been validated in all clinical situations. eGFR's persistently <60 mL/min signify possible Chronic Kidney Disease.    Anion gap 8 5 - 15  Ethanol     Status: None   Collection Time: 09/19/16  5:20 AM  Result Value Ref Range   Alcohol, Ethyl (B) <5 <5 mg/dL    Comment:        LOWEST DETECTABLE LIMIT FOR SERUM ALCOHOL IS 5 mg/dL FOR MEDICAL PURPOSES ONLY   CBC with Differential     Status: Abnormal   Collection Time: 09/19/16  5:20 AM  Result Value Ref Range   WBC 7.9 4.0 - 10.5 K/uL   RBC 3.49 (L) 3.87 - 5.11 MIL/uL   Hemoglobin 11.3 (L) 12.0 - 15.0 g/dL   HCT 34.2 (L) 36.0 - 46.0 %   MCV 98.0 78.0 - 100.0 fL   MCH 32.4 26.0 - 34.0 pg   MCHC 33.0 30.0 - 36.0 g/dL   RDW 14.2 11.5 - 15.5 %   Platelets 257 150 - 400 K/uL   Neutrophils Relative % 72 %   Neutro Abs 5.7 1.7 - 7.7 K/uL   Lymphocytes Relative 21 %   Lymphs Abs 1.6 0.7 - 4.0 K/uL   Monocytes Relative 6 %   Monocytes Absolute 0.4 0.1 - 1.0 K/uL   Eosinophils Relative 1 %   Eosinophils Absolute 0.1 0.0 - 0.7 K/uL   Basophils Relative 0 %   Basophils Absolute 0.0 0.0 - 0.1 K/uL  Acetaminophen level     Status: None   Collection Time: 09/19/16  5:20 AM  Result Value Ref Range    Acetaminophen (Tylenol), Serum 23 10 - 30 ug/mL    Comment:        THERAPEUTIC CONCENTRATIONS VARY SIGNIFICANTLY. A RANGE OF 10-30 ug/mL MAY BE AN EFFECTIVE CONCENTRATION FOR MANY PATIENTS. HOWEVER, SOME ARE BEST TREATED AT CONCENTRATIONS OUTSIDE THIS RANGE. ACETAMINOPHEN CONCENTRATIONS >150 ug/mL AT 4 HOURS AFTER INGESTION AND >50 ug/mL AT 12 HOURS AFTER INGESTION ARE OFTEN ASSOCIATED WITH TOXIC REACTIONS.   Salicylate level     Status: None   Collection Time: 09/19/16  5:20 AM  Result Value Ref Range   Salicylate Lvl <5.6 2.8 - 30.0 mg/dL  Magnesium     Status: None   Collection Time: 09/19/16  5:20 AM  Result Value Ref Range   Magnesium 1.7 1.7 - 2.4 mg/dL  Phosphorus     Status: None   Collection Time: 09/19/16  5:20 AM  Result Value Ref Range   Phosphorus 3.0 2.5 - 4.6 mg/dL  Troponin I     Status: None   Collection Time: 09/19/16  5:20 AM  Result Value Ref Range   Troponin I <0.03 <0.03 ng/mL  Procalcitonin - Baseline     Status: None   Collection Time: 09/19/16  5:20 AM  Result Value Ref Range   Procalcitonin <0.10 ng/mL    Comment:        Interpretation: PCT (Procalcitonin) <= 0.5 ng/mL: Systemic infection (sepsis) is not likely. Local bacterial infection is possible. (NOTE)         ICU PCT Algorithm               Non ICU PCT Algorithm    ----------------------------     ------------------------------         PCT < 0.25 ng/mL  PCT < 0.1 ng/mL     Stopping of antibiotics            Stopping of antibiotics       strongly encouraged.               strongly encouraged.    ----------------------------     ------------------------------       PCT level decrease by               PCT < 0.25 ng/mL       >= 80% from peak PCT       OR PCT 0.25 - 0.5 ng/mL          Stopping of antibiotics                                             encouraged.     Stopping of antibiotics           encouraged.    ----------------------------      ------------------------------       PCT level decrease by              PCT >= 0.25 ng/mL       < 80% from peak PCT        AND PCT >= 0.5 ng/mL            Continuin g antibiotics                                              encouraged.       Continuing antibiotics            encouraged.    ----------------------------     ------------------------------     PCT level increase compared          PCT > 0.5 ng/mL         with peak PCT AND          PCT >= 0.5 ng/mL             Escalation of antibiotics                                          strongly encouraged.      Escalation of antibiotics        strongly encouraged.   Triglycerides     Status: Abnormal   Collection Time: 09/19/16  5:20 AM  Result Value Ref Range   Triglycerides 178 (H) <150 mg/dL  I-Stat arterial blood gas, ED     Status: Abnormal   Collection Time: 09/19/16  6:08 AM  Result Value Ref Range   pH, Arterial 7.354 7.350 - 7.450   pCO2 arterial 51.8 (H) 32.0 - 48.0 mmHg   pO2, Arterial 480.0 (H) 83.0 - 108.0 mmHg   Bicarbonate 29.1 (H) 20.0 - 28.0 mmol/L   TCO2 31 0 - 100 mmol/L   O2 Saturation 100.0 %   Acid-Base Excess 2.0 0.0 - 2.0 mmol/L   Patient temperature 97.2 F    Collection site RADIAL, ALLEN'S TEST ACCEPTABLE    Drawn by RT    Sample type ARTERIAL   I-stat troponin, ED  Status: None   Collection Time: 09/19/16  6:09 AM  Result Value Ref Range   Troponin i, poc 0.00 0.00 - 0.08 ng/mL   Comment 3            Comment: Due to the release kinetics of cTnI, a negative result within the first hours of the onset of symptoms does not rule out myocardial infarction with certainty. If myocardial infarction is still suspected, repeat the test at appropriate intervals.   I-Stat CG4 Lactic Acid, ED     Status: Abnormal   Collection Time: 09/19/16  6:11 AM  Result Value Ref Range   Lactic Acid, Venous 2.24 (HH) 0.5 - 1.9 mmol/L   Comment NOTIFIED PHYSICIAN   MRSA PCR Screening     Status: None    Collection Time: 09/19/16  6:42 AM  Result Value Ref Range   MRSA by PCR NEGATIVE NEGATIVE    Comment:        The GeneXpert MRSA Assay (FDA approved for NASAL specimens only), is one component of a comprehensive MRSA colonization surveillance program. It is not intended to diagnose MRSA infection nor to guide or monitor treatment for MRSA infections.   Culture, respiratory (NON-Expectorated)     Status: None (Preliminary result)   Collection Time: 09/19/16  8:09 AM  Result Value Ref Range   Specimen Description TRACHEAL ASPIRATE    Special Requests NONE    Gram Stain      RARE WBC PRESENT, PREDOMINANTLY PMN RARE GRAM POSITIVE COCCI IN PAIRS    Culture CULTURE REINCUBATED FOR BETTER GROWTH    Report Status PENDING   Urinalysis, Routine w reflex microscopic     Status: None   Collection Time: 09/19/16  9:38 AM  Result Value Ref Range   Color, Urine YELLOW YELLOW   APPearance CLEAR CLEAR   Specific Gravity, Urine 1.014 1.005 - 1.030   pH 6.0 5.0 - 8.0   Glucose, UA NEGATIVE NEGATIVE mg/dL   Hgb urine dipstick NEGATIVE NEGATIVE   Bilirubin Urine NEGATIVE NEGATIVE   Ketones, ur NEGATIVE NEGATIVE mg/dL   Protein, ur NEGATIVE NEGATIVE mg/dL   Nitrite NEGATIVE NEGATIVE   Leukocytes, UA NEGATIVE NEGATIVE  Urine rapid drug screen (hosp performed)     Status: Abnormal   Collection Time: 09/19/16  9:38 AM  Result Value Ref Range   Opiates NONE DETECTED NONE DETECTED   Cocaine POSITIVE (A) NONE DETECTED   Benzodiazepines POSITIVE (A) NONE DETECTED   Amphetamines NONE DETECTED NONE DETECTED   Tetrahydrocannabinol NONE DETECTED NONE DETECTED   Barbiturates NONE DETECTED NONE DETECTED    Comment:        DRUG SCREEN FOR MEDICAL PURPOSES ONLY.  IF CONFIRMATION IS NEEDED FOR ANY PURPOSE, NOTIFY LAB WITHIN 5 DAYS.        LOWEST DETECTABLE LIMITS FOR URINE DRUG SCREEN Drug Class       Cutoff (ng/mL) Amphetamine      1000 Barbiturate      200 Benzodiazepine   409 Tricyclics        811 Opiates          300 Cocaine          300 THC              50   Lactic acid, plasma     Status: None   Collection Time: 09/19/16 11:15 AM  Result Value Ref Range   Lactic Acid, Venous 1.3 0.5 - 1.9 mmol/L  Acetaminophen level  Status: Abnormal   Collection Time: 09/19/16 11:15 AM  Result Value Ref Range   Acetaminophen (Tylenol), Serum <10 (L) 10 - 30 ug/mL    Comment:        THERAPEUTIC CONCENTRATIONS VARY SIGNIFICANTLY. A RANGE OF 10-30 ug/mL MAY BE AN EFFECTIVE CONCENTRATION FOR MANY PATIENTS. HOWEVER, SOME ARE BEST TREATED AT CONCENTRATIONS OUTSIDE THIS RANGE. ACETAMINOPHEN CONCENTRATIONS >150 ug/mL AT 4 HOURS AFTER INGESTION AND >50 ug/mL AT 12 HOURS AFTER INGESTION ARE OFTEN ASSOCIATED WITH TOXIC REACTIONS.   Hepatic function panel     Status: None   Collection Time: 09/19/16 11:15 AM  Result Value Ref Range   Total Protein 7.7 6.5 - 8.1 g/dL   Albumin 4.3 3.5 - 5.0 g/dL   AST 27 15 - 41 U/L   ALT 25 14 - 54 U/L   Alkaline Phosphatase 63 38 - 126 U/L   Total Bilirubin 0.8 0.3 - 1.2 mg/dL   Bilirubin, Direct 0.2 0.1 - 0.5 mg/dL   Indirect Bilirubin 0.6 0.3 - 0.9 mg/dL  Hepatic function panel     Status: None   Collection Time: 09/19/16  3:16 PM  Result Value Ref Range   Total Protein 7.6 6.5 - 8.1 g/dL   Albumin 3.9 3.5 - 5.0 g/dL   AST 26 15 - 41 U/L   ALT 24 14 - 54 U/L   Alkaline Phosphatase 59 38 - 126 U/L   Total Bilirubin 0.9 0.3 - 1.2 mg/dL   Bilirubin, Direct 0.2 0.1 - 0.5 mg/dL   Indirect Bilirubin 0.7 0.3 - 0.9 mg/dL  Procalcitonin     Status: None   Collection Time: 09/20/16  2:49 AM  Result Value Ref Range   Procalcitonin <0.10 ng/mL    Comment:        Interpretation: PCT (Procalcitonin) <= 0.5 ng/mL: Systemic infection (sepsis) is not likely. Local bacterial infection is possible. (NOTE)         ICU PCT Algorithm               Non ICU PCT Algorithm    ----------------------------     ------------------------------         PCT <  0.25 ng/mL                 PCT < 0.1 ng/mL     Stopping of antibiotics            Stopping of antibiotics       strongly encouraged.               strongly encouraged.    ----------------------------     ------------------------------       PCT level decrease by               PCT < 0.25 ng/mL       >= 80% from peak PCT       OR PCT 0.25 - 0.5 ng/mL          Stopping of antibiotics                                             encouraged.     Stopping of antibiotics           encouraged.    ----------------------------     ------------------------------       PCT level decrease by  PCT >= 0.25 ng/mL       < 80% from peak PCT        AND PCT >= 0.5 ng/mL            Continuin g antibiotics                                              encouraged.       Continuing antibiotics            encouraged.    ----------------------------     ------------------------------     PCT level increase compared          PCT > 0.5 ng/mL         with peak PCT AND          PCT >= 0.5 ng/mL             Escalation of antibiotics                                          strongly encouraged.      Escalation of antibiotics        strongly encouraged.   CBC     Status: Abnormal   Collection Time: 09/20/16  2:49 AM  Result Value Ref Range   WBC 12.2 (H) 4.0 - 10.5 K/uL   RBC 2.83 (L) 3.87 - 5.11 MIL/uL   Hemoglobin 9.0 (L) 12.0 - 15.0 g/dL    Comment: DELTA CHECK NOTED REPEATED TO VERIFY    HCT 27.5 (L) 36.0 - 46.0 %   MCV 97.2 78.0 - 100.0 fL   MCH 31.8 26.0 - 34.0 pg   MCHC 32.7 30.0 - 36.0 g/dL   RDW 13.8 11.5 - 15.5 %   Platelets 209 150 - 400 K/uL  Magnesium     Status: None   Collection Time: 09/20/16  2:49 AM  Result Value Ref Range   Magnesium 2.1 1.7 - 2.4 mg/dL  Phosphorus     Status: Abnormal   Collection Time: 09/20/16  2:49 AM  Result Value Ref Range   Phosphorus 2.4 (L) 2.5 - 4.6 mg/dL  Comprehensive metabolic panel     Status: Abnormal   Collection Time: 09/20/16  2:49 AM   Result Value Ref Range   Sodium 138 135 - 145 mmol/L   Potassium 3.3 (L) 3.5 - 5.1 mmol/L   Chloride 109 101 - 111 mmol/L   CO2 23 22 - 32 mmol/L   Glucose, Bld 82 65 - 99 mg/dL   BUN 6 6 - 20 mg/dL   Creatinine, Ser 0.72 0.44 - 1.00 mg/dL   Calcium 7.8 (L) 8.9 - 10.3 mg/dL   Total Protein 6.6 6.5 - 8.1 g/dL   Albumin 3.4 (L) 3.5 - 5.0 g/dL   AST 21 15 - 41 U/L   ALT 19 14 - 54 U/L   Alkaline Phosphatase 52 38 - 126 U/L   Total Bilirubin 0.7 0.3 - 1.2 mg/dL   GFR calc non Af Amer >60 >60 mL/min   GFR calc Af Amer >60 >60 mL/min    Comment: (NOTE) The eGFR has been calculated using the CKD EPI equation. This calculation has not been validated in all clinical situations. eGFR's persistently <60 mL/min signify possible Chronic  Kidney Disease.    Anion gap 6 5 - 15  Blood gas, arterial     Status: None   Collection Time: 09/20/16  3:50 AM  Result Value Ref Range   O2 Content 28.0 L/min   Delivery systems NASAL CANNULA    pH, Arterial 7.376 7.350 - 7.450   pCO2 arterial 41.9 32.0 - 48.0 mmHg   pO2, Arterial 100 83.0 - 108.0 mmHg   Bicarbonate 24.0 20.0 - 28.0 mmol/L   Acid-base deficit 0.5 0.0 - 2.0 mmol/L   O2 Saturation 97.3 %   Patient temperature 98.6    Collection site LEFT RADIAL    Drawn by [id number]    Sample type ARTERIAL DRAW    Allens test (pass/fail) PASS PASS    Current Facility-Administered Medications  Medication Dose Route Frequency Provider Last Rate Last Dose  . 0.9 %  sodium chloride infusion  250 mL Intravenous PRN Hammonds, Sharyn Blitz, MD      . 0.9 %  sodium chloride infusion   Intravenous Continuous Hammonds, Sharyn Blitz, MD 75 mL/hr at 09/20/16 1200    . acetaminophen (TYLENOL) tablet 650 mg  650 mg Oral Q6H PRN Ollis, Brandi L, NP   650 mg at 09/20/16 1139  . famotidine (PEPCID) IVPB 20 mg premix  20 mg Intravenous Q12H Hammonds, Sharyn Blitz, MD   Stopped at 09/20/16 [phone]  . heparin injection 5,000 Units  5,000 Units Subcutaneous Q8H Hammonds, Sharyn Blitz, MD   5,000 Units at 09/20/16 0527    Musculoskeletal: Strength & Muscle Tone: decreased Gait & Station: unable to stand Patient leans: N/A  Psychiatric Specialty Exam: Physical Exam as per history and physical  ROS depression, worries and conflict with mother and intentional suicide attempt. S/P Intubation and extubation.   Blood pressure 139/88, pulse 76, temperature 97.7 F (36.5 C), temperature source Oral, resp. rate (!) 23, height 5' 6"  (1.676 m), weight 94.3 kg (207 lb 14.3 oz), SpO2 98 %.Body mass index is 33.55 kg/m.  General Appearance: Guarded  Eye Contact:  Good  Speech:  Clear and Coherent and Slow  Volume:  Decreased  Mood:  Depressed and Worthless  Affect:  Constricted and Depressed  Thought Process:  Coherent and Goal Directed  Orientation:  Full (Time, Place, and Person)  Thought Content:  Logical and Rumination  Suicidal Thoughts:  Yes.  with intent/plan  Homicidal Thoughts:  No  Memory:  Immediate;   Good Recent;   Fair Remote;   Fair  Judgement:  Impaired  Insight:  Fair  Psychomotor Activity:  Decreased  Concentration:  Concentration: Fair and Attention Span: Fair  Recall:  Good  Fund of Knowledge:  Good  Language:  Good  Akathisia:  Negative  Handed:  Right  AIMS (if indicated):     Assets:  Communication Skills Desire for Improvement Financial Resources/Insurance Housing Intimacy Leisure Time Resilience Social Support Transportation  ADL's:  Intact  Cognition:  WNL  Sleep:        Treatment Plan Summary: Bipolar disorder, MRE is depression Status post suicide attempt Cocaine abuse and benzodiazepine abuse (UDS is positive for both) History of polysubstance abuse  Recommendation: Continue Air cabin crew Recommend no psychotropic medication until medically cleared Refer to CSW for appropriate psych placement Daily contact with patient to assess and evaluate symptoms and progress in treatment and Medication  management  Disposition: Recommend psychiatric Inpatient admission when medically cleared. Supportive therapy provided about ongoing stressors.  Ambrose Finland, MD 09/20/2016 1:06 PM

## 2016-09-20 NOTE — Progress Notes (Addendum)
PROGRESS NOTE                                                                                                                                                                                                             Patient Demographics:    Brittany RaiderKimberley [name], is a 51 y.o. female, DOB - [birth year], YNW:[id number]  Admit date - 09/19/2016   Admitting Physician Gigi GinKathleen H Hammonds, MD  Outpatient Primary MD for the patient is [name], [name], MD  LOS - 1  Outpatient Specialists:Psychiatry  Chief Complaint  Patient presents with  . Loss of Consciousness       Brief Narrative   51 year old female with right-sided trigeminal neuralgia,? Chronic Right ulnar entrapment, bipolar 1 disorder, alcohol use, cocaine use, major depressive disorder, positive hep C with prior suicidal drug overdose presented to the ED with intestinal overdose with Cymbalta. Patient reports having conflict with her mother and took about 30 pills of her Cymbalta. She was also found to have prolonged QT. In the ED she was found to be extremely agitated and required propofol for sedationShe was intubated for airway protection and admitted to ICU. Her Tylenol level was also elevated to 23 for which she was started on NAC. Subsequent Tylenol level improved and it was discontinued. Patient extubated on 8/7 and transferred to hospitalist service.    Subjective:   Patient complains on pain over the right side of the face from her neurologist and also pain in her right forearm and wrist. She tells me that she takes Skelaxin and Vicodin at home. I do not see that in her medication list and told her that I would have to verify with a pharmacy. After I left the room patient started screaming to the nurse and told her that her name the pharmacies [name]. Nurse told her to bring her ID from home to verify her identity. Patient was not asking for pain medication  again.   Assessment  & Plan :   Principal problem Recurrent major depressive disorder with intentional overdose He required ICU monitoring on ventilator. Patient denies being suicidal at present but still feels depressed. Home antidepressants currently on hold. EEG done negative forPsych consult pending.  Trigeminal neurology renal neuralgia and?ulnar nerve entrapment Gabapentin discontinued due to risk of reducing seizure threshold.will verify her home medications. Patient informs that her PCP  is referring her to  Neurosurgeon. As per prior note she has a right V2 distribution neuralgia and refractory tonumerous medications (previously overdose on baclofen and tramadol). Patient complaining of trigeminal neuralgia and right forearm pain. Will confirm her home medications.   Polysubstance use UDS positive for cocaine.(was positive during prior hospitalization as well).  Patient reports she used some cocaine powder orally due to severe pain. Reports only occasional alcohol use.  ? History of seizures On Topamax. Neurology consult appreciated. EEG without seizure activity.  Hypokalemia Replenish  Code Status :full code  Family Communication  : none at bedside  Disposition Plan  : transfer to medical floor. Further recommendation per psychiatry  Barriers For Discharge : improving symptoms, psych eval  Consults  :   PC CM Neurology Psychiatry  Procedures  :  Intubation Head CT  DVT Prophylaxis  :  Lovenox -  Lab Results  Component Value Date   PLT 209 09/20/2016    Antibiotics  :   Anti-infectives    None        Objective:   Vitals:   09/20/16 1000 09/20/16 1100 09/20/16 1118 09/20/16 1200  BP: 111/76 (!) 131/96  139/88  Pulse: 80 83  76  Resp: 18 18  (!) 23  Temp:   97.7 F (36.5 C)   TempSrc:   Oral Oral  SpO2: 97% 97%  98%  Weight:      Height:        Wt Readings from Last 3 Encounters:  09/20/16 94.3 kg (207 lb 14.3 oz)  05/14/16 92.7 kg (204  lb 6 oz)  04/14/16 94.9 kg (209 lb 3.2 oz)     Intake/Output Summary (Last 24 hours) at 09/20/16 1405 Last data filed at 09/20/16 1200  Gross per 24 hour  Intake             1890 ml  Output             3025 ml  Net            -1135 ml     Physical Exam  Gen: not in distress HEENT:  moist mucosa, supple neck, has some twitching over right side of the face Chest: clear b/l, no added sounds CVS: N S1&S2, no murmurs,  GI: soft, NT, ND,  Musculoskeletal: warm, no edema, limited mobility of right forearm with pain CNS: alert and oriented,some twitching lower right side of the face    Data Review:    CBC  Recent Labs Lab 09/19/16 0520 09/20/16 0249  WBC 7.9 12.2*  HGB 11.3* 9.0*  HCT 34.2* 27.5*  PLT 257 209  MCV 98.0 97.2  MCH 32.4 31.8  MCHC 33.0 32.7  RDW 14.2 13.8  LYMPHSABS 1.6  --   MONOABS 0.4  --   EOSABS 0.1  --   BASOSABS 0.0  --     Chemistries   Recent Labs Lab 09/19/16 0520 09/19/16 1115 09/19/16 1516 09/20/16 0249  NA 136  --   --  138  K 3.7  --   --  3.3*  CL 104  --   --  109  CO2 24  --   --  23  GLUCOSE 107*  --   --  82  BUN 15  --   --  6  CREATININE 0.89  --   --  0.72  CALCIUM 8.8*  --   --  7.8*  MG 1.7  --   --  2.1  AST 35 27 26 21   ALT 26 25 24 19   ALKPHOS 59 63 59 52  BILITOT 0.7 0.8 0.9 0.7   ------------------------------------------------------------------------------------------------------------------  Recent Labs  09/19/16 0520  TRIG 178*    Lab Results  Component Value Date   HGBA1C 5.1 05/01/2015   ------------------------------------------------------------------------------------------------------------------ No results for input(s): TSH, T4TOTAL, T3FREE, THYROIDAB in the last 72 hours.  Invalid input(s): FREET3 ------------------------------------------------------------------------------------------------------------------ No results for input(s): VITAMINB12, FOLATE, FERRITIN, TIBC, IRON,  RETICCTPCT in the last 72 hours.  Coagulation profile No results for input(s): INR, PROTIME in the last 168 hours.  No results for input(s): DDIMER in the last 72 hours.  Cardiac Enzymes  Recent Labs Lab 09/19/16 0520  TROPONINI <0.03   ------------------------------------------------------------------------------------------------------------------ No results found for: BNP  Inpatient Medications  Scheduled Meds: . heparin  5,000 Units Subcutaneous Q8H   Continuous Infusions: . sodium chloride    . sodium chloride 75 mL/hr at 09/20/16 1200  . famotidine (PEPCID) IV Stopped (09/20/16 0854)   PRN Meds:.sodium chloride, acetaminophen  Micro Results Recent Results (from the past 240 hour(s))  MRSA PCR Screening     Status: None   Collection Time: 09/19/16  6:42 AM  Result Value Ref Range Status   MRSA by PCR NEGATIVE NEGATIVE Final    Comment:        The GeneXpert MRSA Assay (FDA approved for NASAL specimens only), is one component of a comprehensive MRSA colonization surveillance program. It is not intended to diagnose MRSA infection nor to guide or monitor treatment for MRSA infections.   Culture, respiratory (NON-Expectorated)     Status: None (Preliminary result)   Collection Time: 09/19/16  8:09 AM  Result Value Ref Range Status   Specimen Description TRACHEAL ASPIRATE  Final   Special Requests NONE  Final   Gram Stain   Final    RARE WBC PRESENT, PREDOMINANTLY PMN RARE GRAM POSITIVE COCCI IN PAIRS    Culture CULTURE REINCUBATED FOR BETTER GROWTH  Final   Report Status PENDING  Incomplete    Radiology Reports Ct Head Wo Contrast  Result Date: 09/19/2016 CLINICAL DATA:  Found face down with vomiting, and empty bottle of Cymbalta. Concern for head or cervical spine injury. Initial encounter. EXAM: CT HEAD WITHOUT CONTRAST CT CERVICAL SPINE WITHOUT CONTRAST TECHNIQUE: Multidetector CT imaging of the head and cervical spine was performed following the  standard protocol without intravenous contrast. Multiplanar CT image reconstructions of the cervical spine were also generated. COMPARISON:  CT of the head performed 10/29/2015, and CT of the cervical spine performed 06/10/2015. MRI of the brain performed 07/19/2015 FINDINGS: CT HEAD FINDINGS Brain: No evidence of acute infarction, hemorrhage, hydrocephalus, extra-axial collection or mass lesion/mass effect. The posterior fossa, including the cerebellum, brainstem and fourth ventricle, is within normal limits. The third and lateral ventricles, and basal ganglia are unremarkable in appearance. The cerebral hemispheres are symmetric in appearance, with normal gray-white differentiation. No mass effect or midline shift is seen. Vascular: No hyperdense vessel or unexpected calcification. Skull: There is no evidence of fracture; visualized osseous structures are unremarkable in appearance. There is mild chronic deformity of the anterior wall of the right maxillary sinus. Sinuses/Orbits: The visualized portions of the orbits are within normal limits. The paranasal sinuses and mastoid air cells are well-aerated. Other: No significant soft tissue abnormalities are seen. CT CERVICAL SPINE FINDINGS Alignment: Normal. Skull base and vertebrae: No acute fracture. No primary bone lesion or focal pathologic process. Soft tissues and spinal canal: No prevertebral fluid  or swelling. No visible canal hematoma. Disc levels: Intervertebral disc spaces are preserved. The bony foramina are grossly unremarkable in appearance. Upper chest: Right apical airspace opacity may reflect atelectasis or possibly infection. The thyroid gland is grossly unremarkable in appearance. Other: No additional soft tissue abnormalities are seen. IMPRESSION: 1. No evidence of traumatic intracranial injury or fracture. 2. No evidence of fracture or subluxation along the cervical spine. 3. Right apical airspace opacity may reflect atelectasis or possibly  infection. Electronically Signed   By: Brittany [name] M.D.   On: 09/19/2016 06:46   Ct Cervical Spine Wo Contrast  Result Date: 09/19/2016 CLINICAL DATA:  Found face down with vomiting, and empty bottle of Cymbalta. Concern for head or cervical spine injury. Initial encounter. EXAM: CT HEAD WITHOUT CONTRAST CT CERVICAL SPINE WITHOUT CONTRAST TECHNIQUE: Multidetector CT imaging of the head and cervical spine was performed following the standard protocol without intravenous contrast. Multiplanar CT image reconstructions of the cervical spine were also generated. COMPARISON:  CT of the head performed 10/29/2015, and CT of the cervical spine performed 06/10/2015. MRI of the brain performed 07/19/2015 FINDINGS: CT HEAD FINDINGS Brain: No evidence of acute infarction, hemorrhage, hydrocephalus, extra-axial collection or mass lesion/mass effect. The posterior fossa, including the cerebellum, brainstem and fourth ventricle, is within normal limits. The third and lateral ventricles, and basal ganglia are unremarkable in appearance. The cerebral hemispheres are symmetric in appearance, with normal gray-white differentiation. No mass effect or midline shift is seen. Vascular: No hyperdense vessel or unexpected calcification. Skull: There is no evidence of fracture; visualized osseous structures are unremarkable in appearance. There is mild chronic deformity of the anterior wall of the right maxillary sinus. Sinuses/Orbits: The visualized portions of the orbits are within normal limits. The paranasal sinuses and mastoid air cells are well-aerated. Other: No significant soft tissue abnormalities are seen. CT CERVICAL SPINE FINDINGS Alignment: Normal. Skull base and vertebrae: No acute fracture. No primary bone lesion or focal pathologic process. Soft tissues and spinal canal: No prevertebral fluid or swelling. No visible canal hematoma. Disc levels: Intervertebral disc spaces are preserved. The bony foramina are grossly  unremarkable in appearance. Upper chest: Right apical airspace opacity may reflect atelectasis or possibly infection. The thyroid gland is grossly unremarkable in appearance. Other: No additional soft tissue abnormalities are seen. IMPRESSION: 1. No evidence of traumatic intracranial injury or fracture. 2. No evidence of fracture or subluxation along the cervical spine. 3. Right apical airspace opacity may reflect atelectasis or possibly infection. Electronically Signed   By: Brittany [name] M.D.   On: 09/19/2016 06:46   Dg Chest Port 1 View  Result Date: 09/20/2016 CLINICAL DATA:  Respiratory failure EXAM: PORTABLE CHEST 1 VIEW COMPARISON:  09/19/2016 FINDINGS: Interval removal of nasogastric catheter and endotracheal tube is noted. Cardiac shadow is stable. The lungs are well aerated with minimal left basilar atelectasis new from the prior study. No pneumothorax or sizable effusion is seen. No bony abnormality is noted. IMPRESSION: Minimal left basilar atelectasis. Electronically Signed   By: Alcide Clever M.D.   On: 09/20/2016 08:07   Dg Chest Port 1 View  Result Date: 09/19/2016 CLINICAL DATA:  Endotracheal tube placement.  Initial encounter. EXAM: PORTABLE CHEST 1 VIEW COMPARISON:  Chest radiograph performed 10/30/2015 FINDINGS: The patient's endotracheal tube is seen ending 3-4 cm above the carina. An enteric tube is seen extending below the diaphragm. Mild vascular congestion is noted. Mild bilateral atelectasis is seen. No pleural effusion or pneumothorax is seen. The cardiomediastinal silhouette  is borderline normal in size. No acute osseous abnormalities are identified. IMPRESSION: 1. Endotracheal tube seen ending 3-4 cm above the carina. 2. Mild vascular congestion.  Mild bilateral atelectasis seen. Electronically Signed   By: Brittany [name] M.D.   On: 09/19/2016 05:35    Time Spent in minutes  25   Eddie North M.D on 09/20/2016 at 2:05 PM  Between 7am to 7pm - Pager - [phone]  After  7pm go to [email or web address] - password [facility]  [facility] -  Office  [phone]

## 2016-09-20 NOTE — Progress Notes (Signed)
[facility] LAB REPLACEMENT ONLY  The patient does [facility] based on the criteria listed below:   1. Is GFR >/= 40 ml/min? Yes.    Patient's GFR today is >60 2. Is urine output >/= 0.5 ml/kg/hr for the last 6 hours? Yes.   Patient's UOP is 4.5 ml/kg/hr 3. Is BUN < 60 mg/dL? Yes.    Patient's BUN today is 6 4. Abnormal electrolyte(s): K3.3 5. Ordered repletion with: per protocol 6. If a panic level lab has been reported, has the CCM MD in charge been notified? Yes.  .   Physician:  Clayton LefortV Sood, MD  Melrose NakayamaChisholm, Muskaan Smet William 09/20/2016 6:33 AM

## 2016-09-21 ENCOUNTER — Encounter (HOSPITAL_COMMUNITY): Payer: Self-pay | Admitting: *Deleted

## 2016-09-21 ENCOUNTER — Inpatient Hospital Stay (HOSPITAL_COMMUNITY)
Admission: AD | Admit: 2016-09-21 | Discharge: 2016-09-25 | DRG: 885 | Disposition: A | Discharge status: Home / Self Care | Payer: Medicaid Other | Source: Intra-hospital | Attending: Psychiatry | Admitting: Psychiatry

## 2016-09-21 DIAGNOSIS — F1011 Alcohol abuse, in remission: Secondary | ICD-10-CM | POA: Diagnosis not present

## 2016-09-21 DIAGNOSIS — I10 Essential (primary) hypertension: Secondary | ICD-10-CM | POA: Diagnosis present

## 2016-09-21 DIAGNOSIS — Z818 Family history of other mental and behavioral disorders: Secondary | ICD-10-CM | POA: Diagnosis not present

## 2016-09-21 DIAGNOSIS — F419 Anxiety disorder, unspecified: Secondary | ICD-10-CM | POA: Diagnosis not present

## 2016-09-21 DIAGNOSIS — T50902D Poisoning by unspecified drugs, medicaments and biological substances, intentional self-harm, subsequent encounter: Secondary | ICD-10-CM

## 2016-09-21 DIAGNOSIS — F39 Unspecified mood [affective] disorder: Secondary | ICD-10-CM | POA: Diagnosis not present

## 2016-09-21 DIAGNOSIS — G9341 Metabolic encephalopathy: Secondary | ICD-10-CM

## 2016-09-21 DIAGNOSIS — F3160 Bipolar disorder, current episode mixed, unspecified: Secondary | ICD-10-CM | POA: Diagnosis not present

## 2016-09-21 DIAGNOSIS — Z811 Family history of alcohol abuse and dependence: Secondary | ICD-10-CM

## 2016-09-21 DIAGNOSIS — J9601 Acute respiratory failure with hypoxia: Secondary | ICD-10-CM

## 2016-09-21 DIAGNOSIS — Z885 Allergy status to narcotic agent status: Secondary | ICD-10-CM | POA: Diagnosis not present

## 2016-09-21 DIAGNOSIS — R768 Other specified abnormal immunological findings in serum: Secondary | ICD-10-CM

## 2016-09-21 DIAGNOSIS — G8929 Other chronic pain: Secondary | ICD-10-CM | POA: Diagnosis present

## 2016-09-21 DIAGNOSIS — F1910 Other psychoactive substance abuse, uncomplicated: Secondary | ICD-10-CM | POA: Diagnosis not present

## 2016-09-21 DIAGNOSIS — M791 Myalgia: Secondary | ICD-10-CM | POA: Diagnosis not present

## 2016-09-21 DIAGNOSIS — F17290 Nicotine dependence, other tobacco product, uncomplicated: Secondary | ICD-10-CM | POA: Diagnosis present

## 2016-09-21 DIAGNOSIS — G500 Trigeminal neuralgia: Secondary | ICD-10-CM | POA: Diagnosis present

## 2016-09-21 DIAGNOSIS — M545 Low back pain: Secondary | ICD-10-CM | POA: Diagnosis present

## 2016-09-21 DIAGNOSIS — Z90710 Acquired absence of both cervix and uterus: Secondary | ICD-10-CM | POA: Diagnosis not present

## 2016-09-21 DIAGNOSIS — Z888 Allergy status to other drugs, medicaments and biological substances status: Secondary | ICD-10-CM | POA: Diagnosis not present

## 2016-09-21 DIAGNOSIS — F1211 Cannabis abuse, in remission: Secondary | ICD-10-CM | POA: Diagnosis not present

## 2016-09-21 DIAGNOSIS — Z915 Personal history of self-harm: Secondary | ICD-10-CM | POA: Diagnosis not present

## 2016-09-21 DIAGNOSIS — Z820 Family history of epilepsy and other diseases of the nervous system: Secondary | ICD-10-CM | POA: Diagnosis not present

## 2016-09-21 DIAGNOSIS — R110 Nausea: Secondary | ICD-10-CM | POA: Diagnosis not present

## 2016-09-21 DIAGNOSIS — E039 Hypothyroidism, unspecified: Secondary | ICD-10-CM | POA: Diagnosis not present

## 2016-09-21 DIAGNOSIS — T1491XA Suicide attempt, initial encounter: Secondary | ICD-10-CM | POA: Diagnosis not present

## 2016-09-21 DIAGNOSIS — Z6833 Body mass index (BMI) 33.0-33.9, adult: Secondary | ICD-10-CM | POA: Diagnosis not present

## 2016-09-21 DIAGNOSIS — F332 Major depressive disorder, recurrent severe without psychotic features: Secondary | ICD-10-CM

## 2016-09-21 DIAGNOSIS — T43212A Poisoning by selective serotonin and norepinephrine reuptake inhibitors, intentional self-harm, initial encounter: Secondary | ICD-10-CM | POA: Diagnosis not present

## 2016-09-21 DIAGNOSIS — Z79899 Other long term (current) drug therapy: Secondary | ICD-10-CM | POA: Diagnosis not present

## 2016-09-21 DIAGNOSIS — F17210 Nicotine dependence, cigarettes, uncomplicated: Secondary | ICD-10-CM | POA: Diagnosis not present

## 2016-09-21 DIAGNOSIS — R51 Headache: Secondary | ICD-10-CM | POA: Diagnosis not present

## 2016-09-21 LAB — PROCALCITONIN

## 2016-09-21 LAB — CULTURE, RESPIRATORY W GRAM STAIN: Culture: NORMAL

## 2016-09-21 LAB — CULTURE, RESPIRATORY

## 2016-09-21 MED ORDER — DULOXETINE HCL 30 MG PO CPEP
30.0000 mg | ORAL_CAPSULE | Freq: Every day | ORAL | Status: DC
  Administered 2016-09-21 – 2016-09-22 (×2): 30 mg via ORAL
  Filled 2016-09-21 (×4): qty 1

## 2016-09-21 MED ORDER — HYDROXYZINE HCL 50 MG PO TABS
50.0000 mg | ORAL_TABLET | Freq: Once | ORAL | Status: AC
  Administered 2016-09-21: 50 mg via ORAL
  Filled 2016-09-21 (×2): qty 1

## 2016-09-21 MED ORDER — METHOCARBAMOL 500 MG PO TABS
1000.0000 mg | ORAL_TABLET | Freq: Three times a day (TID) | ORAL | Status: DC | PRN
  Administered 2016-09-21 – 2016-09-25 (×8): 1000 mg via ORAL
  Filled 2016-09-21 (×8): qty 2

## 2016-09-21 MED ORDER — GABAPENTIN 300 MG PO CAPS
300.0000 mg | ORAL_CAPSULE | Freq: Two times a day (BID) | ORAL | Status: DC
  Administered 2016-09-21 – 2016-09-22 (×2): 300 mg via ORAL
  Filled 2016-09-21 (×5): qty 1

## 2016-09-21 MED ORDER — HYDROXYZINE HCL 25 MG PO TABS
25.0000 mg | ORAL_TABLET | Freq: Three times a day (TID) | ORAL | Status: DC | PRN
  Administered 2016-09-22 – 2016-09-25 (×9): 25 mg via ORAL
  Filled 2016-09-21 (×9): qty 1

## 2016-09-21 MED ORDER — AMLODIPINE BESYLATE 5 MG PO TABS
5.0000 mg | ORAL_TABLET | Freq: Every day | ORAL | Status: DC
  Administered 2016-09-21 – 2016-09-25 (×5): 5 mg via ORAL
  Filled 2016-09-21 (×8): qty 1

## 2016-09-21 MED ORDER — ACETAMINOPHEN 325 MG PO TABS
650.0000 mg | ORAL_TABLET | Freq: Four times a day (QID) | ORAL | Status: DC | PRN
  Administered 2016-09-21 – 2016-09-25 (×8): 650 mg via ORAL
  Filled 2016-09-21 (×8): qty 2

## 2016-09-21 MED ORDER — ALUM & MAG HYDROXIDE-SIMETH 200-200-20 MG/5ML PO SUSP: 30.0000 mL | ORAL | Status: DC | PRN

## 2016-09-21 MED ORDER — MAGNESIUM HYDROXIDE 400 MG/5ML PO SUSP: 30.0000 mL | Freq: Every day | ORAL | Status: DC | PRN

## 2016-09-21 NOTE — Tx Team (Signed)
Initial Treatment Plan 09/21/2016 11:51 PM Roanna RaiderKimberley Goin [id number]    PATIENT STRESSORS: Financial difficulties Marital or family conflict Substance abuse Other: Pain   PATIENT STRENGTHS: Ability for insight Average or above average intelligence Capable of independent living Communication skills Motivation for treatment/growth Supportive family/friends   PATIENT IDENTIFIED PROBLEMS: At risk for suicide  "Pain management"  "Not to give up"                 DISCHARGE CRITERIA:  Ability to meet basic life and health needs Improved stabilization in mood, thinking, and/or behavior Motivation to continue treatment in a less acute level of care Need for constant or close observation no longer present  PRELIMINARY DISCHARGE PLAN: Outpatient therapy Return to previous living arrangement  PATIENT/FAMILY INVOLVEMENT: This treatment plan has been presented to and reviewed with the patient, [place].  The patient and family have been given the opportunity to ask questions and make suggestions.  Carleene OverlieMiddleton, Dekker Verga P, RN 09/21/2016, 11:51 PM

## 2016-09-21 NOTE — Care Management Note (Signed)
Case Management Note  Patient Details  Name: Roanna RaiderKimberley Zima MRN: [id number] Date of Birth: Jun 17, 1965  Subjective/Objective:   Pt admitted on 09/19/16 s/p intentional Rx drug overdose with hypoxic respiratory failure.  PTA, pt independent of ADLS; has fiance and daughter.                   Action/Plan: Pt extubated today.  Psych evaluation pending.  Suicide sitter remains at bedside.  Will follow for discharge planning as pt progresses.    Expected Discharge Date:                  [facility]  In-House Referral:  Clinical Social Work  Discharge planning Services  CM Consult  Post Acute Care Choice:    Choice offered to:     DME Arranged:    DME Agency:     HH Arranged:    HH Agency:     Status of Service:  In process, will continue to follow  If discussed at Long Length of Stay Meetings, dates discussed:    Additional Comments:  09/21/16 J. Martena Emanuele, RN, BSN Psych evaluation complete; recommending behavioral health facility upon medical stability/bed available.  CSW following to facilitate this.    Quintella BatonJulie W. Alessio Bogan, RN, BSN  Trauma/Neuro ICU Case Manager [phone]

## 2016-09-21 NOTE — Progress Notes (Signed)
Patient ID: Brittany RaiderKimberley Petree, female   DOB: 12/12/1965, 51 y.o.   MRN: [id number]  Pt did not attend wrap up group session.

## 2016-09-21 NOTE — Progress Notes (Signed)
Patient is medically stable for transport to [facility] Patient accepted to room 400 bed 1 Patient can transport after 5pm.  LCSW met with patient at the bedside and reports she is aware of process for behavioral health and very concerned that she will not get her medication for pain.  Patient reports the last time she went she was not given her medication and this was not helpful and she was unable to participate because she was in so much pain. LCSW assured MD orders and pain medication would be evaluated and assistance provided to address pain.  RN aware that patient admitting to [facility] Voluntary consents signed and completed.  Faxed to behavioral health.  Patient will transport by [facility]. Patient has called and notified her fiance of plans. No other needs.  DC today.  Lane Hacker, MSW Clinical Social Work: Print[employer] Coverage for :  [phone]

## 2016-09-21 NOTE — Discharge Summary (Addendum)
Physician Discharge Summary  Brittany [name] GNF:[id number] DOB: 03-May-1965 DOA: 09/19/2016  PCP: Evelene [name], MD  Admit date: 09/19/2016 Discharge date: 09/21/2016  Admitted From: Home Disposition:  Home  Recommendations for Outpatient Follow-up:   discharged to behavioral health Hospital.    Discharge Condition: Fair CODE STATUS: Full code Diet recommendation: Regular    Discharge Diagnoses:  Principal Problem:   Bipolar I disorder, most recent episode mixed (HCC)   Active Problems:   Facial nerve injury   Acute Metabolic  encephalopathy   Essential hypertension   Severe episode of recurrent major depressive disorder, without psychotic features (HCC)   Overdose   Trigeminal neuralgia   Positive hepatitis C antibody test  Brief narrative/history of present illness 51 year old female with right-sided trigeminal neuralgia,? Chronic Right ulnar entrapment, bipolar 1 disorder, alcohol use, cocaine use, major depressive disorder, positive hep C with prior suicidal drug overdose presented to the ED with intestinal overdose with Cymbalta. Patient reports having conflict with her mother and took about 30 pills of her Cymbalta. She was also found to have prolonged QT. In the ED she was found to be extremely agitated and required propofol for sedationShe was intubated for airway protection and admitted to ICU. Her Tylenol level was also elevated to 23 for which she was started on NAC. Subsequent Tylenol level improved and it was discontinued. Patient extubated on 8/7 and transferred to hospitalist service.  Hospital course  Principal problem Recurrent major depressive disorder with intentional overdose He required ICU monitoring on ventilator. Patient denies being suicidal at present but still feels depressed. Multiple prior episodes of suicide overdose and admission to psychiatric facility Home antidepressants currently on hold. EEG negative for seizure activity. Have held her  tramadol and gabapentin. Seen by psych consult recommends no psychotropic medication and psychiatric inpatient admission was medically cleared.  Trigeminal neurology renal neuralgia and?ulnar nerve entrapment Gabapentin discontinued due to risk of reducing seizure threshold. home medications verified and placed her back on her Skelaxin and Percocet. Resume Robaxin. As per prior note she has a right V2 distribution neuralgia . Patient reports that her PCP has referred her to neurologist in [facility]. Patient complaining of unknown no entrapment (Noted since November 2017). Ordered wrist splint.   Polysubstance use UDS positive for cocaine.(was positive during prior hospitalization as well).  Patient reports she used some cocaine powder orally due to severe pain. Reports only occasional alcohol use.  History of seizures On Topamax. Neurology consult appreciated. EEG without seizure activity. Have held her tramadol and  Hypokalemia Replenished   Code Status :full code  Family Communication  : none at bedside  Disposition Plan  : Discharge to [facility]    Consults  :   [facility] Neurology Psychiatry  Procedures  :  Intubation Head CT   Discharge Instructions   Allergies as of 09/21/2016      Reactions   Toradol [ketorolac Tromethamine] Swelling, Other (See Comments)   Arms and stomach became swollen   Suboxone [buprenorphine Hcl-naloxone Hcl] Other (See Comments)   Cellulitis results, if taken   Subutex [buprenorphine] Rash   Tegretol [carbamazepine] Rash   Trazodone And Nefazodone Other (See Comments)   Bladder infection results, if taken      Medication List    STOP taking these medications   DULoxetine 60 MG capsule Commonly known as:  CYMBALTA   eszopiclone 3 MG Tabs Generic drug:  Eszopiclone   gabapentin 400 MG capsule Commonly known as:  NEURONTIN   hydrOXYzine 25  MG tablet Commonly known as:  ATARAX/VISTARIL   topiramate 50 MG tablet Commonly  known as:  TOPAMAX   traMADol 50 MG tablet Commonly known as:  ULTRAM amitriptyline 25 MG tablet Commonly known as:  ELAVIL     TAKE these medications   acetaminophen 325 MG tablet Commonly known as:  TYLENOL Take 325-650 mg by mouth every 6 (six) hours as needed for mild pain or headache.      amLODipine 5 MG tablet Commonly known as:  NORVASC Take 5 mg by mouth daily.   clonazePAM 0.5 MG tablet Commonly known as:  KLONOPIN Take 0.5 mg by mouth every 12 (twelve) hours as needed for anxiety.   HYDROcodone-acetaminophen 10-325 MG tablet Commonly known as:  NORCO Take 1 tablet by mouth See admin instructions. Every 4-6 hours as needed for pain   lamoTRIgine 100 MG tablet Commonly known as:  LAMICTAL Take 100 mg by mouth 2 (two) times daily.   metaxalone 800 MG tablet Commonly known as:  SKELAXIN Take 800 mg by mouth 3 (three) times daily.   methocarbamol 750 MG tablet Commonly known as:  ROBAXIN Take 750 mg by mouth 3 (three) times daily as needed for muscle spasms.      Follow-up Information    [facility] Follow up.          Allergies  Allergen Reactions  . Toradol [Ketorolac Tromethamine] Swelling and Other (See Comments)    Arms and stomach became swollen  . Suboxone [Buprenorphine Hcl-Naloxone Hcl] Other (See Comments)    Cellulitis results, if taken  . Subutex [Buprenorphine] Rash  . Tegretol [Carbamazepine] Rash  . Trazodone And Nefazodone Other (See Comments)    Bladder infection results, if taken      Procedures/Studies: Ct Head Wo Contrast  Result Date: 09/19/2016 CLINICAL DATA:  Found face down with vomiting, and empty bottle of Cymbalta. Concern for head or cervical spine injury. Initial encounter. EXAM: CT HEAD WITHOUT CONTRAST CT CERVICAL SPINE WITHOUT CONTRAST TECHNIQUE: Multidetector CT imaging of the head and cervical spine was performed following the standard protocol without intravenous contrast. Multiplanar CT image reconstructions of the  cervical spine were also generated. COMPARISON:  CT of the head performed 10/29/2015, and CT of the cervical spine performed 06/10/2015. MRI of the brain performed 07/19/2015 FINDINGS: CT HEAD FINDINGS Brain: No evidence of acute infarction, hemorrhage, hydrocephalus, extra-axial collection or mass lesion/mass effect. The posterior fossa, including the cerebellum, brainstem and fourth ventricle, is within normal limits. The third and lateral ventricles, and basal ganglia are unremarkable in appearance. The cerebral hemispheres are symmetric in appearance, with normal gray-white differentiation. No mass effect or midline shift is seen. Vascular: No hyperdense vessel or unexpected calcification. Skull: There is no evidence of fracture; visualized osseous structures are unremarkable in appearance. There is mild chronic deformity of the anterior wall of the right maxillary sinus. Sinuses/Orbits: The visualized portions of the orbits are within normal limits. The paranasal sinuses and mastoid air cells are well-aerated. Other: No significant soft tissue abnormalities are seen. CT CERVICAL SPINE FINDINGS Alignment: Normal. Skull base and vertebrae: No acute fracture. No primary bone lesion or focal pathologic process. Soft tissues and spinal canal: No prevertebral fluid or swelling. No visible canal hematoma. Disc levels: Intervertebral disc spaces are preserved. The bony foramina are grossly unremarkable in appearance. Upper chest: Right apical airspace opacity may reflect atelectasis or possibly infection. The thyroid gland is grossly unremarkable in appearance. Other: No additional soft tissue abnormalities are seen. IMPRESSION: 1. No  evidence of traumatic intracranial injury or fracture. 2. No evidence of fracture or subluxation along the cervical spine. 3. Right apical airspace opacity may reflect atelectasis or possibly infection. Electronically Signed   By: Roanna Raider M.D.   On: 09/19/2016 06:46   Ct Cervical  Spine Wo Contrast  Result Date: 09/19/2016 CLINICAL DATA:  Found face down with vomiting, and empty bottle of Cymbalta. Concern for head or cervical spine injury. Initial encounter. EXAM: CT HEAD WITHOUT CONTRAST CT CERVICAL SPINE WITHOUT CONTRAST TECHNIQUE: Multidetector CT imaging of the head and cervical spine was performed following the standard protocol without intravenous contrast. Multiplanar CT image reconstructions of the cervical spine were also generated. COMPARISON:  CT of the head performed 10/29/2015, and CT of the cervical spine performed 06/10/2015. MRI of the brain performed 07/19/2015 FINDINGS: CT HEAD FINDINGS Brain: No evidence of acute infarction, hemorrhage, hydrocephalus, extra-axial collection or mass lesion/mass effect. The posterior fossa, including the cerebellum, brainstem and fourth ventricle, is within normal limits. The third and lateral ventricles, and basal ganglia are unremarkable in appearance. The cerebral hemispheres are symmetric in appearance, with normal gray-white differentiation. No mass effect or midline shift is seen. Vascular: No hyperdense vessel or unexpected calcification. Skull: There is no evidence of fracture; visualized osseous structures are unremarkable in appearance. There is mild chronic deformity of the anterior wall of the right maxillary sinus. Sinuses/Orbits: The visualized portions of the orbits are within normal limits. The paranasal sinuses and mastoid air cells are well-aerated. Other: No significant soft tissue abnormalities are seen. CT CERVICAL SPINE FINDINGS Alignment: Normal. Skull base and vertebrae: No acute fracture. No primary bone lesion or focal pathologic process. Soft tissues and spinal canal: No prevertebral fluid or swelling. No visible canal hematoma. Disc levels: Intervertebral disc spaces are preserved. The bony foramina are grossly unremarkable in appearance. Upper chest: Right apical airspace opacity may reflect atelectasis or  possibly infection. The thyroid gland is grossly unremarkable in appearance. Other: No additional soft tissue abnormalities are seen. IMPRESSION: 1. No evidence of traumatic intracranial injury or fracture. 2. No evidence of fracture or subluxation along the cervical spine. 3. Right apical airspace opacity may reflect atelectasis or possibly infection. Electronically Signed   By: Roanna Raider M.D.   On: 09/19/2016 06:46   Dg Chest Port 1 View  Result Date: 09/20/2016 CLINICAL DATA:  Respiratory failure EXAM: PORTABLE CHEST 1 VIEW COMPARISON:  09/19/2016 FINDINGS: Interval removal of nasogastric catheter and endotracheal tube is noted. Cardiac shadow is stable. The lungs are well aerated with minimal left basilar atelectasis new from the prior study. No pneumothorax or sizable effusion is seen. No bony abnormality is noted. IMPRESSION: Minimal left basilar atelectasis. Electronically Signed   By: Alcide Clever M.D.   On: 09/20/2016 08:07   Dg Chest Port 1 View  Result Date: 09/19/2016 CLINICAL DATA:  Endotracheal tube placement.  Initial encounter. EXAM: PORTABLE CHEST 1 VIEW COMPARISON:  Chest radiograph performed 10/30/2015 FINDINGS: The patient's endotracheal tube is seen ending 3-4 cm above the carina. An enteric tube is seen extending below the diaphragm. Mild vascular congestion is noted. Mild bilateral atelectasis is seen. No pleural effusion or pneumothorax is seen. The cardiomediastinal silhouette is borderline normal in size. No acute osseous abnormalities are identified. IMPRESSION: 1. Endotracheal tube seen ending 3-4 cm above the carina. 2. Mild vascular congestion.  Mild bilateral atelectasis seen. Electronically Signed   By: Roanna Raider M.D.   On: 09/19/2016 05:35       Subjective:  Patient reportedly was crying out in the night asking for pain medications for her right forearm and face. Reports pain to be stable this morning.  Discharge Exam: Vitals:   09/21/16 1100 09/21/16 1200   BP: 113/72 107/72  Pulse: 69 75  Resp: 13 14  Temp:  (!) 97.3 F (36.3 C)  SpO2: 95% 96%   Vitals:   09/21/16 0900 09/21/16 1000 09/21/16 1100 09/21/16 1200  BP: 117/75 109/69 113/72 107/72  Pulse: 69 64 69 75  Resp: 12 12 13 14   Temp:    (!) 97.3 F (36.3 C)  TempSrc:    Oral  SpO2: 96% 94% 95% 96%  Weight:      Height:        Gen: not in distress HEENT:  moist mucosa, supple neck, has some twitching over right side of the face Chest: clear b/l, no added sounds CVS: N S1&S2, no murmurs,  GI: soft, NT, ND,  Musculoskeletal: warm, no edema, limited mobility of right forearm with pain CNS: alert and oriented,some twitching lower right side of the face     The results of significant diagnostics from this hospitalization (including imaging, microbiology, ancillary and laboratory) are listed below for reference.     Microbiology: Recent Results (from the past 240 hour(s))  MRSA PCR Screening     Status: None   Collection Time: 09/19/16  6:42 AM  Result Value Ref Range Status   MRSA by PCR NEGATIVE NEGATIVE Final    Comment:        The GeneXpert MRSA Assay (FDA approved for NASAL specimens only), is one component of a comprehensive MRSA colonization surveillance program. It is not intended to diagnose MRSA infection nor to guide or monitor treatment for MRSA infections.   Culture, respiratory (NON-Expectorated)     Status: None   Collection Time: 09/19/16  8:09 AM  Result Value Ref Range Status   Specimen Description TRACHEAL ASPIRATE  Final   Special Requests NONE  Final   Gram Stain   Final    RARE WBC PRESENT, PREDOMINANTLY PMN RARE GRAM POSITIVE COCCI IN PAIRS    Culture Consistent with normal respiratory flora.  Final   Report Status 09/21/2016 FINAL  Final     Labs: BNP (last 3 results) No results for input(s): BNP in the last 8760 hours. Basic Metabolic Panel:  Recent Labs Lab 09/19/16 0520 09/20/16 0249  NA 136 138  K 3.7 3.3*  CL 104  109  CO2 24 23  GLUCOSE 107* 82  BUN 15 6  CREATININE 0.89 0.72  CALCIUM 8.8* 7.8*  MG 1.7 2.1  PHOS 3.0 2.4*   Liver Function Tests:  Recent Labs Lab 09/19/16 0520 09/19/16 1115 09/19/16 1516 09/20/16 0249  AST 35 27 26 21   ALT 26 25 24 19   ALKPHOS 59 63 59 52  BILITOT 0.7 0.8 0.9 0.7  PROT 7.9 7.7 7.6 6.6  ALBUMIN 4.5 4.3 3.9 3.4*   No results for input(s): LIPASE, AMYLASE in the last 168 hours. No results for input(s): AMMONIA in the last 168 hours. CBC:  Recent Labs Lab 09/19/16 0520 09/20/16 0249  WBC 7.9 12.2*  NEUTROABS 5.7  --   HGB 11.3* 9.0*  HCT 34.2* 27.5*  MCV 98.0 97.2  PLT 257 209   Cardiac Enzymes:  Recent Labs Lab 09/19/16 0520  TROPONINI <0.03   BNP: Invalid input(s): POCBNP CBG: No results for input(s): GLUCAP in the last 168 hours. D-Dimer No results for input(s): DDIMER in  the last 72 hours. Hgb A1c No results for input(s): HGBA1C in the last 72 hours. Lipid Profile  Recent Labs  09/19/16 0520  TRIG 178*   Thyroid function studies No results for input(s): TSH, T4TOTAL, T3FREE, THYROIDAB in the last 72 hours.  Invalid input(s): FREET3 Anemia work up No results for input(s): VITAMINB12, FOLATE, FERRITIN, TIBC, IRON, RETICCTPCT in the last 72 hours. Urinalysis    Component Value Date/Time   COLORURINE YELLOW 09/19/2016 0938   APPEARANCEUR CLEAR 09/19/2016 0938   APPEARANCEUR Clear 04/25/2011 1804   LABSPEC 1.014 09/19/2016 0938   LABSPEC 1.024 04/25/2011 1804   PHURINE 6.0 09/19/2016 0938   GLUCOSEU NEGATIVE 09/19/2016 0938   GLUCOSEU Negative 04/25/2011 1804   HGBUR NEGATIVE 09/19/2016 0938   BILIRUBINUR NEGATIVE 09/19/2016 0938   BILIRUBINUR Negative 04/25/2011 1804   KETONESUR NEGATIVE 09/19/2016 0938   PROTEINUR NEGATIVE 09/19/2016 0938   UROBILINOGEN 0.2 12/23/2014 1048   NITRITE NEGATIVE 09/19/2016 0938   LEUKOCYTESUR NEGATIVE 09/19/2016 0938   LEUKOCYTESUR Negative 04/25/2011 1804   Sepsis Labs Invalid  input(s): PROCALCITONIN,  WBC,  LACTICIDVEN Microbiology Recent Results (from the past 240 hour(s))  MRSA PCR Screening     Status: None   Collection Time: 09/19/16  6:42 AM  Result Value Ref Range Status   MRSA by PCR NEGATIVE NEGATIVE Final    Comment:        The GeneXpert MRSA Assay (FDA approved for NASAL specimens only), is one component of a comprehensive MRSA colonization surveillance program. It is not intended to diagnose MRSA infection nor to guide or monitor treatment for MRSA infections.   Culture, respiratory (NON-Expectorated)     Status: None   Collection Time: 09/19/16  8:09 AM  Result Value Ref Range Status   Specimen Description TRACHEAL ASPIRATE  Final   Special Requests NONE  Final   Gram Stain   Final    RARE WBC PRESENT, PREDOMINANTLY PMN RARE GRAM POSITIVE COCCI IN PAIRS    Culture Consistent with normal respiratory flora.  Final   Report Status 09/21/2016 FINAL  Final     Time coordinating discharge: Over 30 minutes  SIGNED:   Eddie NorthHUNGEL, Braxden Lovering, MD  [facility] 09/21/2016, 1:18 PM Pager   If 7PM-7AM, please contact night-coverage [email or web address] Password [email or web address]

## 2016-09-21 NOTE — Progress Notes (Signed)
Admission Note:  51 year old female who presents voluntary, in no acute distress, for the treatment of SI following an intentional overdose of Tylenol and Cymbalta. Patient states "I just wanted to take the meds and go to sleep and never wake up so the pain will go away".  Patient states "I am just in a dark place".  Patient appears depressed and anxious with complaints of chronic pain. Patient was cooperative with admission process. Patient presents with passive SI and contracts for safety upon admission. Patient denies AVH. Patient identifies main stressors as "no money, pain that I can't control, and issues with my marriage".  Patient reports that she is on disability and has been taking care of all the bills herself due to her fiance working on his PhD. Patient reports medical hx of "Ulner nerve entrapment" and "trigeminal neuralgia".    Patient reports marijuana and cocaine use for "Self-medication" for the pain.  Patient reports previous suicide attempt "20 years ago" and states "I took over 100 pills of muscle relaxers".  Patient reports that she can be "bed ridden" at times due to the pain.  [facility], patient would like to work on "pain management" and "Not to give up".  Skin was assessed. Patient had cut to left forearm.  Patient searched and no contraband found, POC and unit policies explained and understanding verbalized. Consents obtained.  Patient had no additional questions or concerns.

## 2016-09-21 NOTE — Progress Notes (Signed)
Orthopedic Tech Progress Note Patient Details:  Brittany RaiderKimberley [name] 1966-01-26 [id number]  Ortho Devices Type of Ortho Device: Velcro wrist splint Ortho Device/Splint Location: rue Ortho Device/Splint Interventions: Application   [name] 09/21/2016, 3:04 PM

## 2016-09-21 NOTE — Progress Notes (Addendum)
Pt began screaming because of "ulnar entrapment" pain in her right wrist, took IV out and refused to have another put in. Pt currently on no IV medications and transferring out tomorrow. Will continue to monitor.

## 2016-09-22 DIAGNOSIS — R110 Nausea: Secondary | ICD-10-CM

## 2016-09-22 DIAGNOSIS — F1910 Other psychoactive substance abuse, uncomplicated: Secondary | ICD-10-CM

## 2016-09-22 DIAGNOSIS — T1491XA Suicide attempt, initial encounter: Secondary | ICD-10-CM

## 2016-09-22 DIAGNOSIS — R51 Headache: Secondary | ICD-10-CM

## 2016-09-22 DIAGNOSIS — T43212A Poisoning by selective serotonin and norepinephrine reuptake inhibitors, intentional self-harm, initial encounter: Secondary | ICD-10-CM

## 2016-09-22 MED ORDER — CLONAZEPAM 0.5 MG PO TABS
0.5000 mg | ORAL_TABLET | Freq: Two times a day (BID) | ORAL | Status: DC | PRN
  Administered 2016-09-22 – 2016-09-25 (×6): 0.5 mg via ORAL
  Filled 2016-09-22 (×6): qty 1

## 2016-09-22 MED ORDER — AMITRIPTYLINE HCL 25 MG PO TABS
25.0000 mg | ORAL_TABLET | Freq: Every day | ORAL | Status: DC
  Administered 2016-09-22 – 2016-09-24 (×3): 25 mg via ORAL
  Filled 2016-09-22 (×5): qty 1

## 2016-09-22 MED ORDER — GABAPENTIN 100 MG PO CAPS
100.0000 mg | ORAL_CAPSULE | Freq: Three times a day (TID) | ORAL | Status: DC
  Filled 2016-09-22 (×5): qty 1

## 2016-09-22 MED ORDER — LAMOTRIGINE 100 MG PO TABS
100.0000 mg | ORAL_TABLET | Freq: Two times a day (BID) | ORAL | Status: DC
Start: 2016-09-22 — End: 2016-09-25
  Administered 2016-09-22 – 2016-09-25 (×6): 100 mg via ORAL
  Filled 2016-09-22 (×10): qty 1

## 2016-09-22 MED ORDER — CLONAZEPAM 0.5 MG PO TABS
ORAL_TABLET | ORAL | Status: AC
  Filled 2016-09-22: qty 1

## 2016-09-22 MED ORDER — HYDROCODONE-ACETAMINOPHEN 5-325 MG PO TABS
1.0000 | ORAL_TABLET | Freq: Three times a day (TID) | ORAL | Status: DC | PRN
Start: 2016-09-22 — End: 2016-09-22
  Administered 2016-09-22: 1 via ORAL
  Filled 2016-09-22: qty 1

## 2016-09-22 MED ORDER — GABAPENTIN 100 MG PO CAPS
100.0000 mg | ORAL_CAPSULE | Freq: Two times a day (BID) | ORAL | Status: DC
  Administered 2016-09-22 – 2016-09-24 (×4): 100 mg via ORAL
  Filled 2016-09-22 (×6): qty 1

## 2016-09-22 MED ORDER — CLONAZEPAM 0.5 MG PO TBDP: 0.5000 mg | ORAL_TABLET | Freq: Two times a day (BID) | ORAL | Status: DC | PRN

## 2016-09-22 NOTE — Progress Notes (Signed)
Pt stated she came into hospital with jewelry, shoes, and dress.  [facility] and wanted to know where her belongings were.  Jewelry given to patient to inspect and patient stated all jewelry present.  Gave jewelry to [facility] transporter.  Did not find dress or shoe on unit.

## 2016-09-22 NOTE — [facility] Group Notes (Signed)
[facility] LCSW Group Therapy 09/22/2016 1:15pm  Type of Therapy: Group Therapy- Feelings Around Relapse and Recovery  Pt did not attend, declined invitation.   Vernie ShanksLauren Joniel Graumann, LCSW [phone] 09/22/2016 3:25 PM

## 2016-09-22 NOTE — Progress Notes (Signed)
Adult Psychoeducational Group Note  Date:  09/22/2016 Time:  9:16 PM  Group Topic/Focus:  Wrap-Up Group:   The focus of this group is to help patients review their daily goal of treatment and discuss progress on daily workbooks.  Participation Level:  None  Additional Comments:  Pt arrived to group late after talking to RN. Pt had an emotional interaction with fiance during visitation and did not wish to participated during group.   Berlin Hunuttle, Jadelyn Elks M 09/22/2016, 9:16 PM

## 2016-09-22 NOTE — Progress Notes (Signed)
Recreation Therapy Notes  Date: 09/22/2016 Time: 9:30am Location: [address] Dayroom  Group Topic: Stress Management  Goal Area(s) Addresses:  Patient will verbalize importance of using healthy stress management.  Patient will identify positive emotions associated with healthy stress management.   Intervention: Stress Management  Activity :  Guided Meditation. Recreation Therapy Intern introduced the stress management technique of guided meditation. Recreation Therapy Intern read a script that allowed patients to reach into their inner "chakra." Recreation Therapy Intern played calming meditation music. Patients were to follow along as script was read to engage in the activity.  Education: Stress Management, Discharge Planning.   Education Outcome: Needs additional education  Clinical Observations/Feedback: Pt did not attend group.  Rachit Grim, Recreation Therapy Intern  

## 2016-09-22 NOTE — Progress Notes (Signed)
Patient ID: Brittany [name], female   DOB: [birth year], 51 y.o.   MRN: [id number]  D: Patient stating she is in unbearable pain from trigeminal neurogial. Pt reports pain level is 20 on a 0-10 scale. Pt asked for opioids because that worked for her in the past. Pt able to talk to in house provider. 100 mg robaxin ordered and given. Denies  SI/HI/AVH but states she will kill self if no relieve from pain.  A: Support and encouragement offered as needed to express needs. Medications administered as prescribed.  R: Patient is safe on unit. Will continue to monitor  for safety and stability.
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
## 2016-09-22 NOTE — Tx Team (Signed)
Interdisciplinary Treatment and Diagnostic Plan Update  09/22/2016 Time of Session: 9:30am Brittany [name] MRN: [id number]  Principal Diagnosis: Bipolar Disorder, Depressed versus Depression secondary to Chronic Illness/Pain  Secondary Diagnoses: Active Problems:   MDD (major depressive disorder), recurrent episode, severe (HCC)   Current Medications:  Current Facility-Administered Medications  Medication Dose Route Frequency Provider Last Rate Last Dose  . acetaminophen (TYLENOL) tablet 650 mg  650 mg Oral Q6H PRN Money, Lowry Ram, FNP   650 mg at 09/22/16 0017  . alum & mag hydroxide-simeth (MAALOX/MYLANTA) 200-200-20 MG/5ML suspension 30 mL  30 mL Oral Q4H PRN Money, Darnelle Maffucci B, FNP      . amLODipine (NORVASC) tablet 5 mg  5 mg Oral Daily Money, Lowry Ram, FNP   5 mg at 09/22/16 4944  . DULoxetine (CYMBALTA) DR capsule 30 mg  30 mg Oral Daily Money, Lowry Ram, FNP   30 mg at 09/22/16 9675  . gabapentin (NEURONTIN) capsule 300 mg  300 mg Oral BID Money, Lowry Ram, FNP   300 mg at 09/22/16 9163  . hydrOXYzine (ATARAX/VISTARIL) tablet 25 mg  25 mg Oral TID PRN Money, Lowry Ram, FNP   25 mg at 09/22/16 0815  . magnesium hydroxide (MILK OF MAGNESIA) suspension 30 mL  30 mL Oral Daily PRN Money, Lowry Ram, FNP      . methocarbamol (ROBAXIN) tablet 1,000 mg  1,000 mg Oral Q8H PRN Lindon Romp A, NP   1,000 mg at 09/22/16 0815    PTA Medications: Prescriptions Prior to Admission  Medication Sig Dispense Refill Last Dose  . acetaminophen (TYLENOL) 325 MG tablet Take 325-650 mg by mouth every 6 (six) hours as needed for mild pain or headache.   PRN at PRN  . amitriptyline (ELAVIL) 25 MG tablet Take 25 mg by mouth at bedtime.   09/18/2016 at pm  . amLODipine (NORVASC) 5 MG tablet Take 5 mg by mouth daily.   09/19/2016 at am  . clonazePAM (KLONOPIN) 0.5 MG tablet Take 0.5 mg by mouth every 12 (twelve) hours as needed for anxiety.   09/18/2016 at pm  . HYDROcodone-acetaminophen (NORCO) 10-325 MG tablet  Take 1 tablet by mouth See admin instructions. Every 4-6 hours as needed for pain   09/19/2016 at am  . lamoTRIgine (LAMICTAL) 100 MG tablet Take 100 mg by mouth 2 (two) times daily.   Past Week at Unknown time  . metaxalone (SKELAXIN) 800 MG tablet Take 800 mg by mouth 3 (three) times daily.   09/18/2016 at pm  . methocarbamol (ROBAXIN) 750 MG tablet Take 750 mg by mouth 3 (three) times daily as needed for muscle spasms.    Past Week at Unknown time    Treatment Modalities: Medication Management, Group therapy, Case management,  1 to 1 session with clinician, Psychoeducation, Recreational therapy.  Patient Stressors: Financial difficulties Marital or family conflict Substance abuse Other: Pain  Patient Strengths: Ability for insight Average or above average intelligence Capable of independent living Agricultural engineer for treatment/growth Supportive family/friends  Physician Treatment Plan for Primary Diagnosis: Bipolar Disorder, Depressed versus Depression secondary to Chronic Illness/Pain Long Term Goal(s): Improvement in symptoms so as ready for discharge  Short Term Goals: Ability to verbalize feelings will improve Ability to disclose and discuss suicidal ideas Ability to demonstrate self-control will improve Ability to identify and develop effective coping behaviors will improve Ability to maintain clinical measurements within normal limits will improve Compliance with prescribed medications will improve Ability to identify changes in lifestyle to reduce  recurrence of condition will improve Compliance with prescribed medications will improve Ability to identify triggers associated with substance abuse/mental health issues will improve  Medication Management: Evaluate patient's response, side effects, and tolerance of medication regimen.  Therapeutic Interventions: 1 to 1 sessions, Unit Group sessions and Medication administration.  Evaluation of Outcomes: Not  Met  Physician Treatment Plan for Secondary Diagnosis: Active Problems:   MDD (major depressive disorder), recurrent episode, severe ([place])   Long Term Goal(s): Improvement in symptoms so as ready for discharge  Short Term Goals: Ability to verbalize feelings will improve Ability to disclose and discuss suicidal ideas Ability to demonstrate self-control will improve Ability to identify and develop effective coping behaviors will improve Ability to maintain clinical measurements within normal limits will improve Compliance with prescribed medications will improve Ability to identify changes in lifestyle to reduce recurrence of condition will improve Compliance with prescribed medications will improve Ability to identify triggers associated with substance abuse/mental health issues will improve  Medication Management: Evaluate patient's response, side effects, and tolerance of medication regimen.  Therapeutic Interventions: 1 to 1 sessions, Unit Group sessions and Medication administration.  Evaluation of Outcomes: Not Met   RN Treatment Plan for Primary Diagnosis: Bipolar Disorder, Depressed versus Depression secondary to Chronic Illness/Pain Long Term Goal(s): Knowledge of disease and therapeutic regimen to maintain health will improve  Short Term Goals: Ability to remain free from injury will improve, Ability to demonstrate self-control, Ability to participate in decision making will improve, Ability to disclose and discuss suicidal ideas and Ability to identify and develop effective coping behaviors will improve  Medication Management: RN will administer medications as ordered by provider, will assess and evaluate patient's response and provide education to patient for prescribed medication. RN will report any adverse and/or side effects to prescribing provider.  Therapeutic Interventions: 1 on 1 counseling sessions, Psychoeducation, Medication administration, Evaluate responses to  treatment, Monitor vital signs and CBGs as ordered, Perform/monitor CIWA, COWS, AIMS and Fall Risk screenings as ordered, Perform wound care treatments as ordered.  Evaluation of Outcomes: Not Met   LCSW Treatment Plan for Primary Diagnosis: Bipolar Disorder, Depressed versus Depression secondary to Chronic Illness/Pain Long Term Goal(s): Safe transition to appropriate next level of care at discharge, Engage patient in therapeutic group addressing interpersonal concerns.  Short Term Goals: Engage patient in aftercare planning with referrals and resources, Identify triggers associated with mental health/substance abuse issues and Increase skills for wellness and recovery  Therapeutic Interventions: Assess for all discharge needs, 1 to 1 time with Social worker, Explore available resources and support systems, Assess for adequacy in community support network, Educate family and significant other(s) on suicide prevention, Complete Psychosocial Assessment, Interpersonal group therapy.  Evaluation of Outcomes: Not Met   Progress in Treatment: Attending groups: Pt is new to milieu, continuing to assess  Participating in groups: Pt is new to milieu, continuing to assess  Taking medication as prescribed: Yes, MD continues to assess for medication changes as needed Toleration medication: Yes, no side effects reported at this time Family/Significant other contact made: No, CSW attempting to make contact with significant other Patient understands diagnosis: Continuing to assess Discussing patient identified problems/goals with staff: Yes Medical problems stabilized or resolved: Yes Denies suicidal/homicidal ideation: Recently admitted post-status intentional overdose Issues/concerns per patient self-inventory: None Other: N/A  New problem(s) identified: None identified at this time.   New Short Term/Long Term Goal(s):  "I want better coping skills"  Discharge Plan or Barriers: Pt will return  home and  follow-up [facility]  Reason for Continuation of Hospitalization: Anxiety Depression Medication stabilization Suicidal ideation  Estimated Length of Stay: 3-5 days; Est DC date 09/27/16  Attendees: Patient: Brittany [name]  09/22/2016  8:34 AM  Physician: Dr. Parke Poisson 09/22/2016  8:34 AM  Nursing: Opal Sidles, RN 09/22/2016  8:34 AM  RN Care Manager: Lars Pinks, RN 09/22/2016  8:34 AM  Social Worker: Adriana Reams, LCSW 09/22/2016  8:34 AM  Recreational Therapist:  09/22/2016  8:34 AM  Other: Lindell Spar, NP; Marvia Pickles, NP 09/22/2016  8:34 AM  Other:  09/22/2016  8:34 AM  Other: 09/22/2016  8:34 AM    Scribe for Treatment Team: Gladstone Lighter, LCSW 09/22/2016 8:34 AM

## 2016-09-22 NOTE — Progress Notes (Signed)
Pt present with anxious affect and depressed mood. Pt stayed in bed most of the am, pt stated she did not sleep well due the facial and right arm pain. Pt complained of pain, came to the medication window demanding to be all the medication she could get. Pt was explained she can not get all the medication like that, but has to be given at scheduled time. Pt provided with medications per providers orders. Pt's labs and vitals were monitored throughout the night. Pt given a 1:1 about emotional and mental status. Pt supported and encouraged to express concerns and questions. Pt educated on medications.

## 2016-09-22 NOTE — Social Work (Signed)
Referred to [facility], is [place] resident.  Sloka Volante, LCSW Lead Clinical Social Worker Phone:  [phone]  
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
## 2016-09-23 DIAGNOSIS — M791 Myalgia: Secondary | ICD-10-CM

## 2016-09-23 DIAGNOSIS — F39 Unspecified mood [affective] disorder: Secondary | ICD-10-CM

## 2016-09-23 DIAGNOSIS — Z818 Family history of other mental and behavioral disorders: Secondary | ICD-10-CM

## 2016-09-23 DIAGNOSIS — F419 Anxiety disorder, unspecified: Secondary | ICD-10-CM

## 2016-09-23 DIAGNOSIS — F1211 Cannabis abuse, in remission: Secondary | ICD-10-CM

## 2016-09-23 DIAGNOSIS — F332 Major depressive disorder, recurrent severe without psychotic features: Principal | ICD-10-CM

## 2016-09-23 DIAGNOSIS — F1011 Alcohol abuse, in remission: Secondary | ICD-10-CM

## 2016-09-23 DIAGNOSIS — F17210 Nicotine dependence, cigarettes, uncomplicated: Secondary | ICD-10-CM

## 2016-09-23 LAB — BASIC METABOLIC PANEL
ANION GAP: 9 (ref 5–15)
BUN: 23 mg/dL — AB (ref 6–20)
CHLORIDE: 103 mmol/L (ref 101–111)
CO2: 27 mmol/L (ref 22–32)
Calcium: 9.6 mg/dL (ref 8.9–10.3)
Creatinine, Ser: 1.05 mg/dL — ABNORMAL HIGH (ref 0.44–1.00)
GFR calc Af Amer: >60 mL/min (ref >60)
GLUCOSE: 85 mg/dL (ref 65–99)
POTASSIUM: 4.2 mmol/L (ref 3.5–5.1)
Sodium: 139 mmol/L (ref 135–145)

## 2016-09-23 LAB — CBC WITH DIFFERENTIAL/PLATELET
BASOS ABS: 0 10*3/uL (ref 0.0–0.1)
Basophils Relative: 0 %
Eosinophils Absolute: 0.2 10*3/uL (ref 0.0–0.7)
Eosinophils Relative: 3 %
HEMATOCRIT: 32.1 % — AB (ref 36.0–46.0)
HEMOGLOBIN: 10.7 g/dL — AB (ref 12.0–15.0)
LYMPHS ABS: 2.2 10*3/uL (ref 0.7–4.0)
LYMPHS PCT: 34 %
MCH: 31.8 pg (ref 26.0–34.0)
MCHC: 33.3 g/dL (ref 30.0–36.0)
MCV: 95.5 fL (ref 78.0–100.0)
Monocytes Absolute: 0.6 10*3/uL (ref 0.1–1.0)
Monocytes Relative: 9 %
NEUTROS ABS: 3.4 10*3/uL (ref 1.7–7.7)
Neutrophils Relative %: 54 %
Platelets: 274 10*3/uL (ref 150–400)
RBC: 3.36 MIL/uL — AB (ref 3.87–5.11)
RDW: 13.6 % (ref 11.5–15.5)
WBC: 6.5 10*3/uL (ref 4.0–10.5)

## 2016-09-23 NOTE — Progress Notes (Signed)
[facility] MD Progress Note  09/23/2016 11:33 AM Brittany [name]  MRN:  [id number]   Subjective:  Patient reports that she is feeling okay, but did not sleep well last night. She reports that her fiance was having some problems at home and she may need to leave sooner than she needs to, so she can help him. She is easily coerced into staying here until she is stable, and states "I can't do anything for him like this. You're right I need to stay."  Objective: Patient is easily redirected and able to focus on herself. Suspect if she signs a 72 hour she may have to be IVC'd since she attempted suicide with an overdose. Patient denies any SI/HI/AVH. She reports improved mood and decreased depression.  Principal Problem: MDD (major depressive disorder), recurrent episode, severe ([place]) Diagnosis:   Patient Active Problem List   Diagnosis Date Noted  . MDD (major depressive disorder), recurrent episode, severe ([place]) [F33.2] 09/21/2016  . Bipolar I disorder, most recent episode depressed ([place]) [F31.30] 05/16/2016  . History of trigeminal neuralgia [Z86.69] 05/16/2016  . Benzodiazepine dependence ([place]) [F13.20] 05/16/2016  . Alcohol use disorder, moderate, dependence ([place]) [F10.20] 05/16/2016  . Positive hepatitis C antibody test [R76.8] 12/24/2015  . Bipolar 1 disorder ([place]) [F31.9] 12/23/2015  . Trigeminal neuralgia [G50.0] 12/23/2015  . Overdose [T50.901A] 12/22/2015  . Baclofen overdose [T42.8X1A] 10/28/2015  . Severe episode of recurrent major depressive disorder, without psychotic features ([place]) [F33.2]   . Acute respiratory failure with hypoxemia ([place]) [J96.01]   . Bipolar I disorder, most recent episode mixed ([place]) [F31.60] 06/14/2015  . Essential hypertension [I10]   . Sinus bradycardia [R00.1] 06/10/2015  . Prolonged QT interval [R94.31] 05/03/2015  . Morbid obesity due to excess calories ([place]) [E66.01]   . GERD (gastroesophageal reflux disease) [K21.9]   . Migraine without aura and  without status migrainosus, not intractable [G43.009]   . Overdose [T50.901A] 03/23/2015  . History of hepatitis C virus infection [Z86.19] 02/26/2015  . Acute encephalopathy [G93.40] 02/24/2015  . 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 Right facial pain [R51] 01/05/2015  . Polypharmacy [Z79.899] 01/18/2012    Class: Chronic  . Borderline personality disorder [F60.3] 01/18/2012  . Hypothyroidism [E03.9] 09/16/2011   Total Time spent with patient: 25 minutes  Past Psychiatric History: See H&P  Past Medical History:  Past Medical History:  Diagnosis Date  . Anxiety   . Bipolar 1 disorder ([place])   . Bulging lumbar disc   . Chronic bronchitis ([place])   . Chronic lower back pain   . Depression   . Facial nerve injury   . Facial pain 11/2013   walking dog when she tripped and "faceplanted" landing on her face.   Marland Kitchen GERD (gastroesophageal reflux disease)   . Goiter   . Head injury   . Heart murmur   . Hyperlipidemia   . Hypertension   . Hypothyroidism   . Migraine    "weekly" (03/23/2015)  . Ruptured intervertebral disc   . Seizures ([place])    "I've had a few; had a grand mal when I took Neurotin; still take Neurotin" (03/23/2015)  . Short-term memory loss     Past Surgical History:  Procedure Laterality Date  . ABDOMINAL HYSTERECTOMY    . CESAREAN SECTION  2004  . EYE SURGERY Right 11/2013   emergent lateral canthotomy due to proptosis  Archie Endo 11/30/2013  . TONSILLECTOMY     Family History:  Family History  Problem Relation Age of Onset  .  Migraines Mother   . Bipolar disorder Mother   . Migraines Father   . Other Father   . Parkinson's disease Father   . Migraines Sister    Family Psychiatric  History: See H&P Social History:  History  Alcohol Use No    Comment: 03/23/2015 "might drink at Christmastime"     History  Drug Use  . Types: Marijuana    Comment: 03/23/2015 ""haven't smoked marijuana for years"    Social History   Social History  . Marital status: Single     Spouse name: N/A  . Number of children: 1  . Years of education: 2   Occupational History  . Unemployed    Social History Main Topics  . Smoking status: Current Every Day Smoker  . Smokeless tobacco: Never Used  . Alcohol use No     Comment: 03/23/2015 "might drink at Christmastime"  . Drug use: Yes    Types: Marijuana     Comment: 03/23/2015 ""haven't smoked marijuana for years"  . Sexual activity: Yes    Birth control/ protection: Condom   Other Topics Concern  . None   Social History Narrative   ** Merged History Encounter **       Lives at home with her fiancee. Right-handed. No caffeine use.   Additional Social History:                         Sleep: Good  Appetite:  Good  Current Medications: Current Facility-Administered Medications  Medication Dose Route Frequency Provider Last Rate Last Dose  . acetaminophen (TYLENOL) tablet 650 mg  650 mg Oral Q6H PRN Leshawn Straka, Lowry Ram, FNP   650 mg at 09/23/16 0927  . alum & mag hydroxide-simeth (MAALOX/MYLANTA) 200-200-20 MG/5ML suspension 30 mL  30 mL Oral Q4H PRN Samaia Iwata, Lowry Ram, FNP      . amitriptyline (ELAVIL) tablet 25 mg  25 mg Oral QHS Cobos, Myer Peer, MD   25 mg at 09/22/16 2156  . amLODipine (NORVASC) tablet 5 mg  5 mg Oral Daily Brance Dartt, Lowry Ram, FNP   5 mg at 09/23/16 3474  . clonazePAM (KLONOPIN) tablet 0.5 mg  0.5 mg Oral BID PRN Minda Ditto, RPH   0.5 mg at 09/23/16 2595  . gabapentin (NEURONTIN) capsule 100 mg  100 mg Oral BID Cobos, Myer Peer, MD   100 mg at 09/23/16 0929  . hydrOXYzine (ATARAX/VISTARIL) tablet 25 mg  25 mg Oral TID PRN Towanda Hornstein, Lowry Ram, FNP   25 mg at 09/23/16 0927  . lamoTRIgine (LAMICTAL) tablet 100 mg  100 mg Oral BID Cobos, Myer Peer, MD   100 mg at 09/23/16 0923  . magnesium hydroxide (MILK OF MAGNESIA) suspension 30 mL  30 mL Oral Daily PRN Destane Speas, Lowry Ram, FNP      . methocarbamol (ROBAXIN) tablet 1,000 mg  1,000 mg Oral Q8H PRN Rozetta Nunnery, NP   1,000 mg at 09/23/16  6387    Lab Results:  Results for orders placed or performed during the hospital encounter of 09/21/16 (from the past 48 hour(s))  CBC with Differential/Platelet     Status: Abnormal   Collection Time: 09/23/16  6:37 AM  Result Value Ref Range   WBC 6.5 4.0 - 10.5 K/uL   RBC 3.36 (L) 3.87 - 5.11 MIL/uL   Hemoglobin 10.7 (L) 12.0 - 15.0 g/dL   HCT 32.1 (L) 36.0 - 46.0 %   MCV 95.5 78.0 - 100.0  fL   MCH 31.8 26.0 - 34.0 pg   MCHC 33.3 30.0 - 36.0 g/dL   RDW 13.6 11.5 - 15.5 %   Platelets 274 150 - 400 K/uL   Neutrophils Relative % 54 %   Neutro Abs 3.4 1.7 - 7.7 K/uL   Lymphocytes Relative 34 %   Lymphs Abs 2.2 0.7 - 4.0 K/uL   Monocytes Relative 9 %   Monocytes Absolute 0.6 0.1 - 1.0 K/uL   Eosinophils Relative 3 %   Eosinophils Absolute 0.2 0.0 - 0.7 K/uL   Basophils Relative 0 %   Basophils Absolute 0.0 0.0 - 0.1 K/uL    Comment: Performed at [facility], [place] [address] [place], [place] [address]  Basic metabolic panel     Status: Abnormal   Collection Time: 09/23/16  6:37 AM  Result Value Ref Range   Sodium 139 135 - 145 mmol/L   Potassium 4.2 3.5 - 5.1 mmol/L   Chloride 103 101 - 111 mmol/L   CO2 27 22 - 32 mmol/L   Glucose, Bld 85 65 - 99 mg/dL   BUN 23 (H) 6 - 20 mg/dL   Creatinine, Ser 1.05 (H) 0.44 - 1.00 mg/dL   Calcium 9.6 8.9 - 10.3 mg/dL   GFR calc non Af Amer >60 >60 mL/min   GFR calc Af Amer >60 >60 mL/min    Comment: (NOTE) The eGFR has been calculated using the CKD EPI equation. This calculation has not been validated in all clinical situations. eGFR's persistently <60 mL/min signify possible Chronic Kidney Disease.    Anion gap 9 5 - 15    Comment: Performed at [facility], [place] [address], [place], [place] [address]    Blood Alcohol level:  Lab Results  Component Value Date   ETH <5 09/19/2016   ETH 112 (H) 81/15/7262    Metabolic Disorder Labs: Lab Results  Component Value Date   HGBA1C 5.1  05/01/2015   MPG 100 05/01/2015   Lab Results  Component Value Date   PROLACTIN 69.9 (H) 05/01/2015   Lab Results  Component Value Date   CHOL 199 04/30/2015   TRIG 178 (H) 09/19/2016   HDL 41 04/30/2015   CHOLHDL 4.9 04/30/2015   VLDL 53 (H) 04/30/2015   LDLCALC 105 (H) 04/30/2015    Physical Findings: AIMS: Facial and Oral Movements Muscles of Facial Expression: None, normal Lips and Perioral Area: None, normal Jaw: None, normal Tongue: None, normal,Extremity Movements Upper (arms, wrists, hands, fingers): None, normal Lower (legs, knees, ankles, toes): None, normal, Trunk Movements Neck, shoulders, hips: None, normal, Overall Severity Severity of abnormal movements (highest score from questions above): None, normal Incapacitation due to abnormal movements: None, normal Patient's awareness of abnormal movements (rate only patient's report): No Awareness, Dental Status Current problems with teeth and/or dentures?: No Does patient usually wear dentures?: No  CIWA:    COWS:     Musculoskeletal: Strength & Muscle Tone: within normal limits Gait & Station: normal Patient leans: N/A  Psychiatric Specialty Exam: Physical Exam  Nursing note and vitals reviewed. Constitutional: She is oriented to person, place, and time. She appears well-developed and well-nourished.  Cardiovascular: Normal rate.   Respiratory: Effort normal.  Musculoskeletal: Normal range of motion.  Neurological: She is alert and oriented to person, place, and time.  Skin: Skin is warm.    Review of Systems  Constitutional: Negative.   HENT: Negative.   Eyes: Negative.   Respiratory: Negative.  Cardiovascular: Negative.   Gastrointestinal: Negative.   Genitourinary: Negative.   Musculoskeletal: Positive for myalgias.  Skin: Negative.   Neurological: Negative.   Endo/Heme/Allergies: Negative.     Blood pressure 107/66, pulse 79, temperature 98.2 F (36.8 C), temperature source Oral, resp.  rate 16, height 5' 4"  (1.626 m), weight 89.8 kg (198 lb).Body mass index is 33.99 kg/m.  General Appearance: Casual  Eye Contact:  Good  Speech:  Clear and Coherent and Normal Rate  Volume:  Normal  Mood:  Depressed  Affect:  Flat  Thought Process:  Coherent and Descriptions of Associations: Intact  Orientation:  Full (Time, Place, and Person)  Thought Content:  WDL  Suicidal Thoughts:  No  Homicidal Thoughts:  No  Memory:  Immediate;   Good Recent;   Good  Judgement:  Good  Insight:  Fair  Psychomotor Activity:  Normal  Concentration:  Concentration: Good  Recall:  Good  Fund of Knowledge:  Good  Language:  Good  Akathisia:  No  Handed:  Right  AIMS (if indicated):     Assets:  Housing Social Support  ADL's:  Intact  Cognition:  WNL  Sleep:  Number of Hours: 6     Treatment Plan Summary: Daily contact with patient to assess and evaluate symptoms and progress in treatment, Medication management and Plan is to:  -Continue Elavil 25 mg PO QHS for mood stability and sleep -Continue Klonopin 0.5 mg PO BID PRN for anxiety -Continue Gabapentin 100 mg PO BID  -Continue Hydroxyzine Q6H PO PRN for anxiety -Continue Lamictal 100 mg PO BID for mood stability -Encourage group therapy partcipation  Lewis Shock, FNP 09/23/2016, 11:33 AM

## 2016-09-23 NOTE — Progress Notes (Signed)
Patient attended group and said that her day was a 6. Patient said she enjoyed the meals and staff members are also good.

## 2016-09-23 NOTE — Plan of Care (Signed)
Problem: Medication: Goal: Compliance with prescribed medication regimen will improve Outcome: Adequate for Discharge Pt is taking scheduled meds.

## 2016-09-23 NOTE — [facility] Group Notes (Signed)
[facility] Group Notes:  (Nursing/MHT/Case Management/Adjunct)  Date:  09/23/2016  Time:  1430    Type of Therapy:  Nurse Education  Participation Level:  Did Not Attend  Summary of Progress/Problems: Pt was invited but did not attend.  Maurine SimmeringShugart, Kersti Scavone M 09/23/2016, 3:55 PM

## 2016-09-23 NOTE — [facility] Group Notes (Signed)
[facility] LCSW Group Therapy Note  Date/Time:    09/23/2016 10:00-11:00AM  Type of Therapy and Topic:  Group Therapy:  Healthy vs Unhealthy Coping Skills  Participation Level:  Did Not Attend   Description of Group:  The focus of this group was to determine what unhealthy coping techniques typically are used by group members and what healthy coping techniques would be helpful in coping with various problems. Patients were guided in becoming aware of the differences between healthy and unhealthy coping techniques.  Patients were asked to identify 1-2 healthy coping skills they would like to learn to use more effectively, and many mentioned meditation, breathing, and relaxation.  These were explained, samples demonstrated, and resources shared for how to learn more at discharge.   At the end of group, additional ideas of healthy coping skills were shared in a fun exercise.  Therapeutic Goals 1. Patients learned that coping is what human beings do all day long to deal with various situations in their lives 2. Patients defined and discussed healthy vs unhealthy coping techniques 3. Patients identified their preferred coping techniques and identified whether these were healthy or unhealthy 4. Patients determined 1-2 healthy coping skills they would like to become more familiar with and use more often, and practiced a few meditations 5. Patients provided support and ideas to each other  Summary of Patient Progress: During group, patient expressed N/A - did not attend   Therapeutic Modalities Cognitive Behavioral Therapy Motivational Interviewing   Ambrose MantleMareida Grossman-Orr, LCSW 09/23/2016, 1:09 PM

## 2016-09-23 NOTE — Progress Notes (Signed)
Writer met patient at nursing station where she was standing and crying to one of the mhts about her fiance coming to visit and was going to be running late and wanting to know if he could visit after visitation. Writer informed her that this should not be a problem but would need to ok this with the charge nurse. She continued to ask when would she know and writer explained that she was stil in report. She continued to go on about needing to know and Probation officer informed her that she would be informed once out of report. Patients fiance came onto the unit for visttation at the time writer was talking with another patient and was informed by mht on the hall that they were yelling and arguing. Charge nurse and [facility] went to speak with them while I was talking with the interpreter for a patient on 300 hall. The fiance was asked to leave because he appeared to have been drinking and was loud on the unit. She came to writer reporting that she was embarrassed and upset. Writer informed her that in terms of medications she did not have anything else to take right now. She was encouraged to attend group but asked to attend group on 500 hall because of the mht conducting the group. This was not granted because there was no order and her needing to attend group on her hall. Permission was not given by charge nurse also. She has been very needy and med seeking behaviors,  asking for times of prn medications available. Support given and safety maintained on unit with 15 min checks.

## 2016-09-23 NOTE — Progress Notes (Signed)
D: "I need all my meds," Brittany [name] said on approach this a.m. She was anxious about problems her fiance was having and said she needed to leave Monday. She was jumping from topic to topic, talking about her many worries, such as needing to find her dog a home because she doesn't think she has the proper environment. She reported poor sleep and high anxiety. She denies current SI and contracts for safety.   A: Meds given as ordered, including multiple PRNs with some relief noted (see MAR). Q15 safety checks and rounding maintained. Support/encouragement offered.  R: Pt remains free from harm and continues with treatment. Will continue to monitor for needs/safety.

## 2016-09-24 MED ORDER — GABAPENTIN 100 MG PO CAPS
200.0000 mg | ORAL_CAPSULE | Freq: Two times a day (BID) | ORAL | Status: DC
  Administered 2016-09-24 – 2016-09-25 (×2): 200 mg via ORAL
  Filled 2016-09-24 (×7): qty 2

## 2016-09-24 MED ORDER — CLONAZEPAM 0.5 MG PO TABS
0.5000 mg | ORAL_TABLET | Freq: Once | ORAL | Status: AC
  Administered 2016-09-24: 0.5 mg via ORAL
  Filled 2016-09-24: qty 1

## 2016-09-24 MED ORDER — OLANZAPINE 5 MG PO TBDP: 5.0000 mg | ORAL_TABLET | Freq: Four times a day (QID) | ORAL | Status: DC | PRN

## 2016-09-24 MED ORDER — OLANZAPINE 10 MG IM SOLR: 5.0000 mg | Freq: Four times a day (QID) | INTRAMUSCULAR | Status: DC | PRN

## 2016-09-24 NOTE — Progress Notes (Signed)
Patient was on the phone with her boyfriend and became very angry that a Child psychotherapistsocial worker spoke to him today.  Patient sts she only wants a doctor or RN to speak to her family or friends.  Patient sts her boyfriend was intoxicated.  Patient was angry and yelling in staff member's faces, screaming that she wants to leave this facility.  Patient took some medicine and calmed down after about 30 minutes of speaking to Engineer, manufacturingN staff.  Patient also spoke with Reola Calkinsravis Money, FNP.  Patient has calmed down and is cooperative at this moment.

## 2016-09-24 NOTE — Progress Notes (Signed)
Group:  Mindfulness/Meditation Activity: Stretching exercises and meditation activity Notes:  Patient was very engaged during group.  She participated in the stretching exercises.  She enjoyed the meditation and stated she would like to try it at home as one of her coping skills.    Vesta Mixeraroline B., RN 0900

## 2016-09-24 NOTE — Progress Notes (Signed)
Writer has observed patient up in the dayroom more tonight. She has been copying a poem posted in up in the dayroom that she plans to give and send to her daughter and other family . She reports that even though she has been in pain she decided to distract herself by writing and talking with peers. Writer praised her for the more positive attitude on tonight.Safety maintained on unit with 15 min chr

## 2016-09-24 NOTE — [facility] Suicide Risk Assessment (Signed)
[facility] INPATIENT:  Family/Significant Other Suicide Prevention Education  Suicide Prevention Education:  Education Completed; Boyfriend Theo DillsJohn Gillikin [phone] has been identified by the patient as the family member/significant other with whom the patient will be residing, and identified as the person(s) who will aid the patient in the event of a mental health crisis (suicidal ideations/suicide attempt).  With written consent from the patient, the family member/significant other has been provided the following suicide prevention education, prior to the and/or following the discharge of the patient.  PT'S SIGNIFICANT OTHER WAS ANGRY, STATING THAT THE NURSE WHO ADMITTED PT WAS REMISS IN NOT GIVING HER THE 72-HOUR DISCHARGE NOTICE TO SIGN AS SOON AS SHE ARRIVED.  CSW ATTEMPTS TO EXPLAIN THIS WERE FUTILE, AS HE REMAINED ANGRY AND STATED THAT HE HAS HAD EXPERIENCE IN THIS FACILITY PREVIOUSLY AND PT IS SAFER WITH HIM THAN WITH THE HOSPITAL.  HE TOLD HOW SHE CAME TO OVERDOSE ON HIS CYMBALTA AND LAMOTRIGINE, STATING "I AM NOT A DOCTOR, I AM JUST A PHD, SO I DID NOT THINK IT WOULD BE LETHAL.  SHE WOKE ME UP LAYING IN HER VOMIT."  HE WAS OFFERED THE NAME AND NUMBER OF THE ADMINISTRATIVE COORDINATOR WITH WHOM TO LODGE HIS COMPLAINTS.  HE REPORTED THERE ARE NO FIREARMS.  The suicide prevention education provided includes the following:  Suicide risk factors  Suicide prevention and interventions  National Suicide Hotline telephone number  [facility] assessment telephone number  [facility] Assistance 911  [facility] and/or Residential Mobile Crisis Unit telephone number  Request made of family/significant other to:  Remove weapons (e.g., guns, rifles, knives), all items previously/currently identified as safety concern.    Remove drugs/medications (over-the-counter, prescriptions, illicit drugs), all items previously/currently identified as a safety concern.  The family  member/significant other verbalizes understanding of the suicide prevention education information provided.  The family member/significant other agrees to remove the items of safety concern listed above.  Carloyn JaegerMareida J Grossman-Orr 09/24/2016, 2:20 PM

## 2016-09-24 NOTE — Progress Notes (Signed)
Writer spoke with patient 1:1 shortly after shift change. She wanted to talkl with me about her incident that had taken place with the social worker on today and the bra she had in her room that she could use to strangle herself with. Writer listened briefly to her about her incident and asked how she was going to move forward fro this incident and to not keep talking about this to staff and peers. She was informed that she could turn the remainder of her evening around and use her coping skills learned. Writer also asked that she bring the bra to be locked up. She was hesistant at first stating that she would not do that but Clinical research associatewriter informed her that if she has made this statement to another nurse and to writer also then it may be best that it be locked up. She brought the bra to be locked up and reported that she was going to write some poetry in her journal and use her breathing techniques. Supported her decisions and offered praise for moving forward. Safety maintained on unit with 15 min checks.

## 2016-09-24 NOTE — Progress Notes (Signed)
  D) Pt slept in this morning and would not get up when asked to come for her medications. When Pt did come to the medication window she was c/o pain on the right side of her face. Pt states that she needs to be discharged on Monday due to having several appointments next week. Affect is flat, sad and mood is depressed. Pt denies SI and HI. Rates her depression at a 4, hopelessness at a 4 and her anxiety at an 8. Pt got upset in group this morning. States she was trying to share her feelings and the facilitator of the group put her finger up to signal her to wait. "I couldn't believe that a professional would act that way. I just wanted to talk but she wouldn't let". Requested a lot of her prm medications this morning for her pain. A) Pt encouraged to speak with the facilitator of the group. Given support and reassurance along with appropriate praise. Encouraged to talk with the doctor who is caring for her today and share what needs she has. Provided with a brief 1:1 R) Pt remained angry with the Social Worker who led the group and said she was not going back in.

## 2016-09-24 NOTE — Progress Notes (Signed)
Talked to Jonny RuizJohn, patient's significant other.  He was extremely upset and asked to speak to [place], [facility].  I asked him if I could help him as Inetta Fermoina was not available at the time.  Patient's boyfriend stated that he was upset because we did not offer the patient a 72 hour discharge.  Explained to him that actually she requested the form this morning and I provided one for her.  She stated that it "would not do any good to sign this, as I have an appointment with my therapist."  I explained to patient that if she didn't want to sign it, she didn't have to.  She elected not to sign the form.  Also informed the boyfriend that we don't just offer the form to patients when they are admitted.  If they decide to leave and they are voluntary, we explain that is an option for them and that they can be held for 72 hours.  Boyfriend would not process anything that I attempted to tell him and kept [facility].  I offered to take down his number, however, he stated he would call back.  Spoke to patient and she stated that boyfriend was "drunk."

## 2016-09-24 NOTE — Social Work (Signed)
Clinical Social Work Note  Pursuant to CSW conversation with boyfriend, with written consent, pt became angry and aggressive with CSW while on the phone with her boyfriend.  She said boyfriend told her this CSW was asking inappropriate questions about her family psychiatric history, and that she had given permission to the social worker and doctor to talk to him, not this CSW.  She said this CSW should not have talked to him because this CSW "interrupted me twice in group today."  During Suicide Prevention Education, patient's boyfriend was informed that one element that raises a person's risk factor for suicide is family history of psychiatric illness.  No question was asked, as this was only one element of numerous that were shared off the brochure we typically provide.  Boyfriend volunteered some information about patient's mother, stating she is "psychotic."  When another element of suicide risk was shared, the presence of firearms, he also interjected, saying "there are lots more ways to commit suicide."  When CSW attempted to explain the phone call to patient, she was yelling and too angry to listen.    Pt demanded to know this CSW's name, and yelled this to her boyfriend on the phone.  Other staff were alarmed by the very loud and angry patient, came to the immediate area quickly, and CSW was asked by other staff to leave the vicinity so that they could calm patient down.    Ambrose MantleMareida Grossman-Orr, LCSW 09/24/2016, 3:23 PM

## 2016-09-24 NOTE — Progress Notes (Signed)
Patient attended group and said that her day was a 5.  Patient said her coping skills for today was socializing, watching tv and going outside.

## 2016-09-24 NOTE — [facility] Group Notes (Signed)
[facility] LCSW Group Therapy Note  Date/Time:  09/24/2016 10:00-11:00AM  Type of Therapy and Topic:  Group Therapy:  Healthy and Unhealthy Supports  Participation Level:  Minimal   Description of Group:  Patients in this group were introduced to the idea of adding a variety of healthy supports to address the various needs in their lives. The picture on the front of Sunday's workbook was used to demonstrate why more supports are needed in every patient's life.  Patients identified and described healthy supports versus unhealthy supports in general, then gave examples of each in their own lives.   They discussed what additional healthy supports could be helpful in their recovery and wellness after discharge in order to prevent future hospitalizations.   An emphasis was placed on using counselor, doctor, therapy groups, 12-step groups, and problem-specific support groups to expand supports.  They also worked as a group on developing a specific plan for several patients to deal with unhealthy supports through boundary-setting, psychoeducation with loved ones, and even termination of relationships.   Therapeutic Goals:   1)  discuss importance of adding supports to stay well once out of the hospital  2)  compare healthy versus unhealthy supports and identify some examples of each  3)  generate ideas and descriptions of healthy supports that can be added  4)  offer mutual support about how to address unhealthy supports  5)  encourage active participation in and adherence to discharge plan    Summary of Patient Progress:  The patient shared that the current healthy/unhealthy supports available in their life are her Community Support team, her therapist, her psychiatrist, online groups to deal with specific conditions she has, and church.  She was very focused and angry that she wants to leave the hospital Monday or first thing Tuesday and that psychiatric provider today has not given her assurance that  will happen.  She stated she has a therapy appointment on 8/14 and a doctor appointment on 8/15, and if she does not make it to those, she will not be able to get another appointment for a month.  CSW attempted to provide encouragement while also explaining the process of getting out of the hospital, and patient was resistant to this, kept reiterating the same information about why she should get out because she has a plan.  She could not be redirected.  Finally, CSW stated we would have to move in, and this angered her greatly.  She said she would leave group because CSW had interrupted her and been very unprofessional.  After group, she informed CSW she is issuing complaints about being interrupted while in group, because it increased her anxiety.  CSW apologized for hurting her feelings, but this was not able to calm her.   Therapeutic Modalities:   Motivational Interviewing Brief Solution-Focused Therapy  Brittany MantleMareida Grossman-Orr, LCSW 09/24/2016, 12:52 PM

## 2016-09-24 NOTE — Progress Notes (Signed)
[facility] MD Progress Note  09/24/2016 12:32 PM Brittany [name]  MRN:  [id number]   Subjective:  Patient reports that she is doing good as for her depression. She then starts complaining of her trigeminal neuralgia pain and that she needs her Klonopin increased. She then continues to talk about her pain and not her mental health. She starts stating "I don't need a pain clinic and I need to see my psychiatrist on Wednesday, because ya'll will not prescribe me what he does." She doesn't deny SI, but doesn't admit it either. When asked, she only starts talking about her pain again. Patient also states that her trigeminal neuralgia is an epidemic and she just needs a doctor that will give her the medications she needs.  Objective: Patient is alert and oriented. Treatment is discussed with team and patient has been irritated about her medcations the rest of the morning and has been complaining to other staff. She became agitated with SW staff during group because she was interrupted while speaking during class. The SW stated that the patient was not focused on discussing the topic of the group session and had to be redirected twice. Patient's gabapentin was increased to 200 mg PO BID.   Principal Problem: MDD (major depressive disorder), recurrent episode, severe ([place]) Diagnosis:   Patient Active Problem List   Diagnosis Date Noted  . MDD (major depressive disorder), recurrent episode, severe ([place]) [F33.2] 09/21/2016  . Bipolar I disorder, most recent episode depressed ([place]) [F31.30] 05/16/2016  . History of trigeminal neuralgia [Z86.69] 05/16/2016  . Benzodiazepine dependence ([place]) [F13.20] 05/16/2016  . Alcohol use disorder, moderate, dependence ([place]) [F10.20] 05/16/2016  . Positive hepatitis C antibody test [R76.8] 12/24/2015  . Bipolar 1 disorder ([place]) [F31.9] 12/23/2015  . Trigeminal neuralgia [G50.0] 12/23/2015  . Overdose [T50.901A] 12/22/2015  . Baclofen overdose [T42.8X1A] 10/28/2015  . Severe  episode of recurrent major depressive disorder, without psychotic features ([place]) [F33.2]   . Acute respiratory failure with hypoxemia ([place]) [J96.01]   . Bipolar I disorder, most recent episode mixed ([name]) [F31.60] 06/14/2015  . Essential hypertension [I10]   . Sinus bradycardia [R00.1] 06/10/2015  . Prolonged QT interval [R94.31] 05/03/2015  . Morbid obesity due to excess calories ([place]) [E66.01]   . GERD (gastroesophageal reflux disease) [K21.9]   . Migraine without aura and without status migrainosus, not intractable [G43.009]   . Overdose [T50.901A] 03/23/2015  . History of hepatitis C virus infection [Z86.19] 02/26/2015  . Acute encephalopathy [G93.40] 02/24/2015  . 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 Right facial pain [R51] 01/05/2015  . Polypharmacy [Z79.899] 01/18/2012    Class: Chronic  . Borderline personality disorder [F60.3] 01/18/2012  . Hypothyroidism [E03.9] 09/16/2011   Total Time spent with patient: 25 minutes  Past Psychiatric History: See H&P  Past Medical History:  Past Medical History:  Diagnosis Date  . Anxiety   . Bipolar 1 disorder ([place])   . Bulging lumbar disc   . Chronic bronchitis ([place])   . Chronic lower back pain   . Depression   . Facial nerve injury   . Facial pain 11/2013   walking dog when she tripped and "faceplanted" landing on her face.   Marland Kitchen GERD (gastroesophageal reflux disease)   . Goiter   . Head injury   . Heart murmur   . Hyperlipidemia   . Hypertension   . Hypothyroidism   . Migraine    "weekly" (03/23/2015)  . Ruptured intervertebral disc   . Seizures ([place])    "  I've had a few; had a grand mal when I took Neurotin; still take Neurotin" (03/23/2015)  . Short-term memory loss     Past Surgical History:  Procedure Laterality Date  . ABDOMINAL HYSTERECTOMY    . CESAREAN SECTION  2004  . EYE SURGERY Right 11/2013   emergent lateral canthotomy due to proptosis  Archie Endo 11/30/2013  . TONSILLECTOMY     Family History:  Family History   Problem Relation Age of Onset  . Migraines Mother   . Bipolar disorder Mother   . Migraines Father   . Other Father   . Parkinson's disease Father   . Migraines Sister    Family Psychiatric  History: See H&P Social History:  History  Alcohol Use No    Comment: 03/23/2015 "might drink at Christmastime"     History  Drug Use  . Types: Marijuana    Comment: 03/23/2015 ""haven't smoked marijuana for years"    Social History   Social History  . Marital status: Single    Spouse name: N/A  . Number of children: 1  . Years of education: 16   Occupational History  . Unemployed    Social History Main Topics  . Smoking status: Current Every Day Smoker  . Smokeless tobacco: Never Used  . Alcohol use No     Comment: 03/23/2015 "might drink at Christmastime"  . Drug use: Yes    Types: Marijuana     Comment: 03/23/2015 ""haven't smoked marijuana for years"  . Sexual activity: Yes    Birth control/ protection: Condom   Other Topics Concern  . None   Social History Narrative   ** Merged History Encounter **       Lives at home with her fiancee. Right-handed. No caffeine use.   Additional Social History:                         Sleep: Good  Appetite:  Good  Current Medications: Current Facility-Administered Medications  Medication Dose Route Frequency Provider Last Rate Last Dose  . acetaminophen (TYLENOL) tablet 650 mg  650 mg Oral Q6H PRN Kamala Kolton, Lowry Ram, FNP   650 mg at 09/24/16 0857  . alum & mag hydroxide-simeth (MAALOX/MYLANTA) 200-200-20 MG/5ML suspension 30 mL  30 mL Oral Q4H PRN Annalyce Lanpher, Lowry Ram, FNP      . amitriptyline (ELAVIL) tablet 25 mg  25 mg Oral QHS Cobos, Myer Peer, MD   25 mg at 09/23/16 2157  . amLODipine (NORVASC) tablet 5 mg  5 mg Oral Daily Keryn Nessler, Lowry Ram, FNP   5 mg at 09/24/16 0850  . clonazePAM (KLONOPIN) tablet 0.5 mg  0.5 mg Oral BID PRN Minda Ditto, RPH   0.5 mg at 09/24/16 0855  . gabapentin (NEURONTIN) capsule 200 mg  200 mg Oral  BID Montavious Wierzba, Lowry Ram, FNP      . hydrOXYzine (ATARAX/VISTARIL) tablet 25 mg  25 mg Oral TID PRN Gracyn Santillanes, Lowry Ram, FNP   25 mg at 09/23/16 2157  . lamoTRIgine (LAMICTAL) tablet 100 mg  100 mg Oral BID Cobos, Myer Peer, MD   100 mg at 09/24/16 0850  . magnesium hydroxide (MILK OF MAGNESIA) suspension 30 mL  30 mL Oral Daily PRN Lisa-Marie Rueger, Lowry Ram, FNP      . methocarbamol (ROBAXIN) tablet 1,000 mg  1,000 mg Oral Q8H PRN Lindon Romp A, NP   1,000 mg at 09/24/16 3267    Lab Results:  Results for orders placed  or performed during the hospital encounter of 09/21/16 (from the past 48 hour(s))  CBC with Differential/Platelet     Status: Abnormal   Collection Time: 09/23/16  6:37 AM  Result Value Ref Range   WBC 6.5 4.0 - 10.5 K/uL   RBC 3.36 (L) 3.87 - 5.11 MIL/uL   Hemoglobin 10.7 (L) 12.0 - 15.0 g/dL   HCT 32.1 (L) 36.0 - 46.0 %   MCV 95.5 78.0 - 100.0 fL   MCH 31.8 26.0 - 34.0 pg   MCHC 33.3 30.0 - 36.0 g/dL   RDW 13.6 11.5 - 15.5 %   Platelets 274 150 - 400 K/uL   Neutrophils Relative % 54 %   Neutro Abs 3.4 1.7 - 7.7 K/uL   Lymphocytes Relative 34 %   Lymphs Abs 2.2 0.7 - 4.0 K/uL   Monocytes Relative 9 %   Monocytes Absolute 0.6 0.1 - 1.0 K/uL   Eosinophils Relative 3 %   Eosinophils Absolute 0.2 0.0 - 0.7 K/uL   Basophils Relative 0 %   Basophils Absolute 0.0 0.0 - 0.1 K/uL    Comment: Performed at [facility], [place] [address], [place], [place] [address]  Basic metabolic panel     Status: Abnormal   Collection Time: 09/23/16  6:37 AM  Result Value Ref Range   Sodium 139 135 - 145 mmol/L   Potassium 4.2 3.5 - 5.1 mmol/L   Chloride 103 101 - 111 mmol/L   CO2 27 22 - 32 mmol/L   Glucose, Bld 85 65 - 99 mg/dL   BUN 23 (H) 6 - 20 mg/dL   Creatinine, Ser 1.05 (H) 0.44 - 1.00 mg/dL   Calcium 9.6 8.9 - 10.3 mg/dL   GFR calc non Af Amer >60 >60 mL/min   GFR calc Af Amer >60 >60 mL/min    Comment: (NOTE) The eGFR has been calculated using the CKD EPI equation. This  calculation has not been validated in all clinical situations. eGFR's persistently <60 mL/min signify possible Chronic Kidney Disease.    Anion gap 9 5 - 15    Comment: Performed at [facility], [place] [address] [place], [place] [address]    Blood Alcohol level:  Lab Results  Component Value Date   ETH <5 09/19/2016   ETH 112 (H) 09/62/8366    Metabolic Disorder Labs: Lab Results  Component Value Date   HGBA1C 5.1 05/01/2015   MPG 100 05/01/2015   Lab Results  Component Value Date   PROLACTIN 69.9 (H) 05/01/2015   Lab Results  Component Value Date   CHOL 199 04/30/2015   TRIG 178 (H) 09/19/2016   HDL 41 04/30/2015   CHOLHDL 4.9 04/30/2015   VLDL 53 (H) 04/30/2015   LDLCALC 105 (H) 04/30/2015    Physical Findings: AIMS: Facial and Oral Movements Muscles of Facial Expression: None, normal Lips and Perioral Area: None, normal Jaw: None, normal Tongue: None, normal,Extremity Movements Upper (arms, wrists, hands, fingers): None, normal Lower (legs, knees, ankles, toes): None, normal, Trunk Movements Neck, shoulders, hips: None, normal, Overall Severity Severity of abnormal movements (highest score from questions above): None, normal Incapacitation due to abnormal movements: None, normal Patient's awareness of abnormal movements (rate only patient's report): No Awareness, Dental Status Current problems with teeth and/or dentures?: No Does patient usually wear dentures?: No  CIWA:    COWS:     Musculoskeletal: Strength & Muscle Tone: within normal limits Gait & Station: normal Patient leans: N/A  Psychiatric Specialty  Exam: Physical Exam  Nursing note and vitals reviewed. Constitutional: She is oriented to person, place, and time. She appears well-developed and well-nourished.  Cardiovascular: Normal rate.   Musculoskeletal: Normal range of motion.  Neurological: She is alert and oriented to person, place, and time.  Skin: Skin is warm.     Review of Systems  Constitutional: Negative.   HENT: Negative.   Eyes: Negative.   Respiratory: Negative.   Cardiovascular: Negative.   Gastrointestinal: Negative.   Genitourinary: Negative.   Musculoskeletal: Positive for joint pain and myalgias.  Skin: Negative.   Neurological:       Trigeminal neuralgia  Endo/Heme/Allergies: Negative.     Blood pressure 104/72, pulse 93, temperature 97.7 F (36.5 C), temperature source Oral, resp. rate 18, height 5' 4"  (1.626 m), weight 89.8 kg (198 lb).Body mass index is 33.99 kg/m.  General Appearance: Casual  Eye Contact:  Good  Speech:  Clear and Coherent, Normal Rate and but pressured at times during agitation  Volume:  Normal  Mood:  Anxious, Depressed and Irritable  Affect:  Flat  Thought Process:  Goal Directed and Descriptions of Associations: Intact  Orientation:  Full (Time, Place, and Person)  Thought Content:  WDL and continuously focused on her pain  Suicidal Thoughts:  She would never deny nor confirm  Homicidal Thoughts:  No  Memory:  Immediate;   Good Recent;   Good  Judgement:  Fair  Insight:  Lacking  Psychomotor Activity:  Normal  Concentration:  Concentration: Good  Recall:  Good  Fund of Knowledge:  Good  Language:  Good  Akathisia:  No  Handed:  Right  AIMS (if indicated):     Assets:  Financial Resources/Insurance Housing Social Support  ADL's:  Intact  Cognition:  WNL  Sleep:  Number of Hours: 6     Treatment Plan Summary: Daily contact with patient to assess and evaluate symptoms and progress in treatment, Medication management and Plan is to:  -Increase Gabapentin 200 mg PO BID for pain and mood -Continue Elavil 25 mg PO QHS for mood stability -Klonopin 0.5 mg PO BID Prn for anxiety -Continue Hydroxyzine 25 mg PO Q6H PRN for anxiety -Continue Lamictal 100 mg PO BID for mood stability -Encourage group therapy participation   Lewis Shock, FNP 09/24/2016, 12:32 PM

## 2016-09-25 MED ORDER — HYDROXYZINE HCL 25 MG PO TABS: 25.0000 mg | ORAL_TABLET | Freq: Three times a day (TID) | ORAL | 0 refills | Status: DC | PRN

## 2016-09-25 MED ORDER — METHOCARBAMOL 500 MG PO TABS: 1000.0000 mg | ORAL_TABLET | Freq: Three times a day (TID) | ORAL | 0 refills | Status: DC | PRN

## 2016-09-25 MED ORDER — CLONAZEPAM 0.5 MG PO TABS: 0.5000 mg | ORAL_TABLET | Freq: Two times a day (BID) | ORAL | 0 refills | Status: DC | PRN

## 2016-09-25 MED ORDER — AMLODIPINE BESYLATE 5 MG PO TABS: 5.0000 mg | ORAL_TABLET | Freq: Every day | ORAL | Status: DC

## 2016-09-25 MED ORDER — AMITRIPTYLINE HCL 25 MG PO TABS: 25.0000 mg | ORAL_TABLET | Freq: Every day | ORAL | 0 refills | Status: DC

## 2016-09-25 MED ORDER — GABAPENTIN 100 MG PO CAPS: 200.0000 mg | ORAL_CAPSULE | Freq: Two times a day (BID) | ORAL | 0 refills | Status: AC

## 2016-09-25 MED ORDER — LAMOTRIGINE 100 MG PO TABS: 100.0000 mg | ORAL_TABLET | Freq: Two times a day (BID) | ORAL | 0 refills | Status: DC

## 2016-09-25 NOTE — Progress Notes (Signed)
Patient continues to ask for pain medication, anxiety medications.  Stated she wanted staff to call pharmacy and order her medications.  Stated she [name]  NP informed.  Then patient changed her mind and said to only [place] [facility] [place] [facility].  Patient was given 4 bus tickets and then patient stated someone is to pick her up at lunch today.   Patient's discharge information given and reviewed with her prior to discharge.  Patient was informed that this information is for her use.

## 2016-09-25 NOTE — Discharge Summary (Signed)
Physician Discharge Summary Note  Patient:  Brittany [name] is an 51 y.o., female MRN:  [id number] DOB:  [birth year] Patient phone:  [phone] (home)  Patient address:   [address] [place] [address],  Total Time spent with patient: 30 minutes  Date of Admission:  09/21/2016 Date of Discharge: 09/25/2016  Reason for Admission:  Worsening depression  Principal Problem: MDD (major depressive disorder), recurrent episode, severe [facility](HCC) Discharge Diagnoses: Patient Active Problem List   Diagnosis Date Noted  . MDD (major depressive disorder), recurrent episode, severe (HCC) [F33.2] 09/21/2016  . Bipolar I disorder, most recent episode depressed (HCC) [F31.30] 05/16/2016  . History of trigeminal neuralgia [Z86.69] 05/16/2016  . Benzodiazepine dependence (HCC) [F13.20] 05/16/2016  . Alcohol use disorder, moderate, dependence (HCC) [F10.20] 05/16/2016  . Positive hepatitis C antibody test [R76.8] 12/24/2015  . Bipolar 1 disorder (HCC) [F31.9] 12/23/2015  . Trigeminal neuralgia [G50.0] 12/23/2015  . Overdose [T50.901A] 12/22/2015  . Baclofen overdose [T42.8X1A] 10/28/2015  . 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 Acute respiratory failure with hypoxemia (HCC) [J96.01]   . Bipolar I disorder, most recent episode mixed (HCC) [F31.60] 06/14/2015  . Essential hypertension [I10]   . Sinus bradycardia [R00.1] 06/10/2015  . Prolonged QT interval [R94.31] 05/03/2015  . Morbid obesity due to excess calories (HCC) [E66.01]   . GERD (gastroesophageal reflux disease) [K21.9]   . Migraine without aura and without status migrainosus, not intractable [G43.009]   . Overdose [T50.901A] 03/23/2015  . History of hepatitis C virus infection [Z86.19] 02/26/2015  . Acute encephalopathy [G93.40] 02/24/2015  . 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 Right facial pain [R51] 01/05/2015  . Polypharmacy [Z79.899] 01/18/2012    Class: Chronic   . Borderline personality disorder [F60.3] 01/18/2012  . Hypothyroidism [E03.9] 09/16/2011    Past Psychiatric History: Bipolar affective disorder.  Past Medical History:  Past Medical History:  Diagnosis Date  . Anxiety   . Bipolar 1 disorder (HCC)   . Bulging lumbar disc   . Chronic bronchitis (HCC)   . Chronic lower back pain   . Depression   . Facial nerve injury   . Facial pain 11/2013   walking dog when she tripped and "faceplanted" landing on her face.   Marland Kitchen. GERD (gastroesophageal reflux disease)   . Goiter   . Head injury   . Heart murmur   . Hyperlipidemia   . Hypertension   . Hypothyroidism   . Migraine    "weekly" (03/23/2015)  . Ruptured intervertebral disc   . Seizures (HCC)    "I've had a few; had a grand mal when I took Neurotin; still take Neurotin" (03/23/2015)  . Short-term memory loss     Past Surgical History:  Procedure Laterality Date  . ABDOMINAL HYSTERECTOMY    . CESAREAN SECTION  2004  . EYE SURGERY Right 11/2013   emergent lateral canthotomy due to proptosis  [name]/notes 11/30/2013  . TONSILLECTOMY     Family History:  Family History  Problem Relation Age of Onset  . Migraines Mother   . Bipolar disorder Mother   . Migraines Father   . Other Father   . Parkinson's disease Father   . Migraines Sister    Family Psychiatric  History:  Mother/Bipolar disorder Social History:  History  Alcohol Use No    Comment: 03/23/2015 "might drink at Christmastime"     History  Drug Use  . Types: Marijuana    Comment: 03/23/2015 ""haven't  smoked marijuana for years"    Social History   Social History  . Marital status: Single    Spouse name: N/A  . Number of children: 1  . Years of education: 80   Occupational History  . Unemployed    Social History Main Topics  . Smoking status: Current Every Day Smoker  . Smokeless tobacco: Never Used  . Alcohol use No     Comment: 03/23/2015 "might drink at Christmastime"  . Drug use: Yes    Types: Marijuana      Comment: 03/23/2015 ""haven't smoked marijuana for years"  . Sexual activity: Yes    Birth control/ protection: Condom   Other Topics Concern  . None   Social History Narrative   ** Merged History Encounter **       Lives at home with her fiancee. Right-handed. No caffeine use.    Hospital Course:  Brittany [name] was admitted for MDD (major depressive disorder), recurrent episode, severe (HCC) and crisis management.  She presented to ED via EMS on 8/7 following overdose on Cymbalta - report is she was found face down with vomitus.  Patient states that this was a suicide attempt. States she has a long history of chronic pain- reports history of trigeminal neuralgia, and R arm pain ( " Ulnar irritation") Following overdose she was admitted to inpatient medical unit, and transferred to psychiatric unit on 8/9 once medically cleared She states her suicide attempt was unplanned, impulsive, and related to " having a bad day". States she felt upset , overwhelmed,due to chronic pain and other issues such water heater not working, difficulty with other appliances " it is always something" States her major stressor, however, is " to get a physician to help me with my pain, now with all the worries about addiction nobody seems to want to help me and my pain is severe ". Endorses neuro-vegetative symptoms of depression as below , which she attributes at least partially to chronic pain. She has a history of a prior psychiatric admission in April 2018 for depression, suicidal ideations, pain. At the time was discharged on Neurontin, Topamax, Cymbalta .  She was treated with the following medication Elavil 25 mg at bedtime for depression/pain, Klonopin 0.5 mg twice daily as needed for anxiety, Lamictal 100 mg twice daily for mood stabilization, Neurontin 200 mg twice daily for anxiety/pain. Brittany [name] was discharged with current medication and was instructed on how to take medications as  prescribed; (details listed below under Medication List).  Medical problems were identified and treated as needed. Her robaxin dosage was increased to help with complaints of muscle pain. Home medications were restarted as appropriate.  Improvement was monitored by observation and Brittany [name] daily report of symptom reduction.  Emotional and mental status was monitored by daily self-inventory reports completed by Brittany [name] and clinical staff.         Brittany [name] was evaluated by the treatment team for stability and plans for continued recovery upon discharge.  Brittany [name] motivation was an integral factor for scheduling further treatment.  Employment, transportation, bed availability, health status, family support, and any pending legal issues were also considered during her hospital stay.  She was offered further treatment options upon discharge including but not limited to Residential, Intensive Outpatient, and Outpatient treatment.  Brittany [name] will follow up with the services as listed below under Follow Up Information.     Upon completion of this admission the [place] was both mentally and medically  stable for discharge denying suicidal/homicidal ideation, auditory/visual/tactile hallucinations, delusional thoughts and paranoia. Patient reported plan to make appointment with [facility] to evaluate ongoing concerns about facial pain.    Physical Findings: AIMS: Facial and Oral Movements Muscles of Facial Expression: None, normal Lips and Perioral Area: None, normal Jaw: None, normal Tongue: None, normal,Extremity Movements Upper (arms, wrists, hands, fingers): None, normal Lower (legs, knees, ankles, toes): None, normal, Trunk Movements Neck, shoulders, hips: None, normal, Overall Severity Severity of abnormal movements (highest score from questions above): None, normal Incapacitation due to abnormal movements: None, normal Patient's  awareness of abnormal movements (rate only patient's report): No Awareness, Dental Status Current problems with teeth and/or dentures?: No Does patient usually wear dentures?: No  CIWA:    COWS:     Musculoskeletal: Strength & Muscle Tone: within normal limits Gait & Station: normal Patient leans: N/A  Psychiatric Specialty Exam:  See Suicide Risk Assessment Physical Exam  ROS  Blood pressure 109/65, pulse 81, temperature 98 F (36.7 C), temperature source Oral, resp. rate 20, height 5\' 4"  (1.626 m), weight 89.8 kg (198 lb).Body mass index is 33.99 kg/m.   Have you used any form of tobacco in the last 30 days? (Cigarettes, Smokeless Tobacco, Cigars, and/or Pipes): No  Has this patient used any form of tobacco in the last 30 days? (Cigarettes, Smokeless Tobacco, Cigars, and/or Pipes) Yes, Yes, A prescription for an FDA-approved tobacco cessation medication was offered at discharge and the patient refused  Blood Alcohol level:  Lab Results  Component Value Date   ETH <5 09/19/2016   ETH 112 (H) 05/14/2016    Metabolic Disorder Labs:  Lab Results  Component Value Date   HGBA1C 5.1 05/01/2015   MPG 100 05/01/2015   Lab Results  Component Value Date   PROLACTIN 69.9 (H) 05/01/2015   Lab Results  Component Value Date   CHOL 199 04/30/2015   TRIG 178 (H) 09/19/2016   HDL 41 04/30/2015   CHOLHDL 4.9 04/30/2015   VLDL 53 (H) 04/30/2015   LDLCALC 105 (H) 04/30/2015    See Psychiatric Specialty Exam and Suicide Risk Assessment completed by Attending Physician prior to discharge.  Discharge destination:  Home  Is patient on multiple antipsychotic therapies at discharge:  No   Has Patient had three or more failed trials of antipsychotic monotherapy by history:  No  Recommended Plan for Multiple Antipsychotic Therapies: NA   Allergies as of 09/25/2016      Reactions   Toradol [ketorolac Tromethamine] Swelling, Other (See Comments)   Arms and stomach became swollen    Suboxone [buprenorphine Hcl-naloxone Hcl] Other (See Comments)   Cellulitis results, if taken   Subutex [buprenorphine] Rash   Tegretol [carbamazepine] Rash   Trazodone And Nefazodone Other (See Comments)   Bladder infection results, if taken      Medication List    STOP taking these medications   metaxalone 800 MG tablet Commonly known as:  SKELAXIN     TAKE these medications     Indication  acetaminophen 325 MG tablet Commonly known as:  TYLENOL Take 325-650 mg by mouth every 6 (six) hours as needed for mild pain or headache.  Indication:  Fever, Pain   amitriptyline 25 MG tablet Commonly known as:  ELAVIL Take 1 tablet (25 mg total) by mouth at bedtime.  Indication:  Depression with Excitement and Restlessness   amLODipine 5 MG tablet Commonly known as:  NORVASC Take 1 tablet (5 mg total) by mouth daily.  Indication:  High Blood Pressure Disorder   clonazePAM 0.5 MG tablet Commonly known as:  KLONOPIN Take 1 tablet (0.5 mg total) by mouth 2 (two) times daily as needed (Anxiety). What changed:  when to take this  reasons to take this  Indication:  Severe anxiety   gabapentin 100 MG capsule Commonly known as:  NEURONTIN Take 2 capsules (200 mg total) by mouth 2 (two) times daily.  Indication:  Agitation, Nerve Pain   HYDROcodone-acetaminophen 10-325 MG tablet Commonly known as:  NORCO Take 1 tablet by mouth See admin instructions. Every 4-6 hours as needed for pain  Indication:  Moderate to Moderately Severe Pain   hydrOXYzine 25 MG tablet Commonly known as:  ATARAX/VISTARIL Take 1 tablet (25 mg total) by mouth 3 (three) times daily as needed for anxiety (Sleep).  Indication:  Anxiety Neurosis (Inactive)   lamoTRIgine 100 MG tablet Commonly known as:  LAMICTAL Take 1 tablet (100 mg total) by mouth 2 (two) times daily.  Indication:  Depression   methocarbamol 500 MG tablet Commonly known as:  ROBAXIN Take 2 tablets (1,000 mg total) by mouth every 8  (eight) hours as needed for muscle spasms. What changed:  medication strength  how much to take  when to take this  Indication:  Musculoskeletal Pain      Follow-up Information    [facility] Follow up.   Why:  8/15 at 4:00pm for medication management with Dr. Omelia Blackwater.  9/17 at 2:00pm for therapy. Contact information: [address] [place] [address] [phone]        [facility] Follow up.   Why:  Patient current w [facility] w this provider, can resume services at discharge if desired.  Contact information: [address] [place] [address] [phone]           Follow-up recommendations:  Activity:  As tolerated Diet:  Heart Healthy  Comments:  Brittany [name] has been instructed to take medications as prescribed; and report adverse effects to outpatient provider.  Follow up with primary doctor for any medical issues and If symptoms recur report to nearest emergency or crisis hot line.    SignedFransisca Kaufmann, NP 09/25/2016, 10:51 AM   Patient seen, Suicide Assessment Completed.  Disposition Plan Reviewed

## 2016-09-25 NOTE — Progress Notes (Signed)
Patient continues to complain of facial pain and anxiety.  Robaxin and klonipin do not help her.  One neurologist told her she did not give an accurate account of her pain, etc.  Patient will [facility].  Has community support team that helps her.

## 2016-09-25 NOTE — Progress Notes (Signed)
  [facility] Discharge Plan :  Will you be returning to the same living situation after discharge:  Yes,  Pt returning home At discharge, do you have transportation home?: Yes,  CST provider to pick up Do you have the ability to pay for your medications: Yes,  Pt provided with prescriptions  Release of information consent forms completed and in the chart;  Patient's signature needed at discharge.  Patient to Follow up at: Follow-up Information    [facility] Follow up.   Why:  8/15 at 4:00pm for medication management with Dr. Omelia BlackwaterHeaden.  9/17 at 2:00pm for therapy. Contact information: [address] [place] [address] [phone]        [facility] Follow up.   Why:  [facility] provider, can resume services at discharge if desired.  Contact information: [address] [place] [address] [phone]           Next level of care provider has [facility] Link:no  Safety Planning and Suicide Prevention discussed: Yes,  with boyfriend; see SPE note  Have you used any form of tobacco in the last 30 days? (Cigarettes, Smokeless Tobacco, Cigars, and/or Pipes): No  Has patient been referred to the Quitline?: N/A patient is not a smoker  Patient has been referred for addiction treatment: Yes  Verdene LennertLauren C Cheetara Hoge, LCSW 09/25/2016, 9:34 AM
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
## 2016-09-25 NOTE — Progress Notes (Signed)
D:  Patient denied SI and HI, contracts for safety.  Denied A/V hallucinations.   A:  Medications administered per MD orders.  Emotional support and encouragement given patient. R:  Safety maintained with 15 minute checks.  

## 2016-09-25 NOTE — Plan of Care (Signed)
Problem: Education: Goal: Ability to state activities that reduce stress will improve Outcome: Progressing Patient is able to share that writing poetry and breathing exercises help reduce her stress.

## 2016-09-25 NOTE — Progress Notes (Signed)
Discharge note:  Patient discharged with driver.  Patient would not let discharge nurse talk with driver.  Patient would not let nurse give driver the discharge information.   Patient continued to talk in low voice about her family and other problems.  Suicide information given and discussed with patient who stated she understood and had no questions.  Patient stated she received all her discharge information that she needed.  Patient stated she received all her belongings, clothing, toiletries, misc itmes, prescriptions.  Patient stated she [facility].  All required discharge information given to patient at discharge.

## 2016-10-03 ENCOUNTER — Encounter (HOSPITAL_COMMUNITY): Payer: Self-pay | Admitting: Emergency Medicine

## 2016-10-03 ENCOUNTER — Emergency Department (HOSPITAL_COMMUNITY)
Admission: EM | Admit: 2016-10-03 | Discharge: 2016-10-03 | Disposition: A | Discharge status: Home / Self Care | Payer: Medicaid Other | Attending: Emergency Medicine | Admitting: Emergency Medicine

## 2016-10-03 DIAGNOSIS — F17210 Nicotine dependence, cigarettes, uncomplicated: Secondary | ICD-10-CM | POA: Insufficient documentation

## 2016-10-03 DIAGNOSIS — X118XXA Contact with other hot tap-water, initial encounter: Secondary | ICD-10-CM | POA: Insufficient documentation

## 2016-10-03 DIAGNOSIS — Y93E8 Activity, other personal hygiene: Secondary | ICD-10-CM | POA: Insufficient documentation

## 2016-10-03 DIAGNOSIS — Z818 Family history of other mental and behavioral disorders: Secondary | ICD-10-CM | POA: Diagnosis not present

## 2016-10-03 DIAGNOSIS — F1290 Cannabis use, unspecified, uncomplicated: Secondary | ICD-10-CM

## 2016-10-03 DIAGNOSIS — Y92002 Bathroom of unspecified non-institutional (private) residence single-family (private) house as the place of occurrence of the external cause: Secondary | ICD-10-CM | POA: Insufficient documentation

## 2016-10-03 DIAGNOSIS — F1910 Other psychoactive substance abuse, uncomplicated: Secondary | ICD-10-CM | POA: Diagnosis not present

## 2016-10-03 DIAGNOSIS — R4689 Other symptoms and signs involving appearance and behavior: Secondary | ICD-10-CM

## 2016-10-03 DIAGNOSIS — R4589 Other symptoms and signs involving emotional state: Secondary | ICD-10-CM | POA: Insufficient documentation

## 2016-10-03 DIAGNOSIS — I10 Essential (primary) hypertension: Secondary | ICD-10-CM | POA: Insufficient documentation

## 2016-10-03 DIAGNOSIS — F1110 Opioid abuse, uncomplicated: Secondary | ICD-10-CM | POA: Diagnosis not present

## 2016-10-03 DIAGNOSIS — Y999 Unspecified external cause status: Secondary | ICD-10-CM | POA: Diagnosis not present

## 2016-10-03 DIAGNOSIS — F332 Major depressive disorder, recurrent severe without psychotic features: Secondary | ICD-10-CM | POA: Diagnosis present

## 2016-10-03 DIAGNOSIS — T24122A Burn of first degree of left knee, initial encounter: Secondary | ICD-10-CM | POA: Diagnosis present

## 2016-10-03 LAB — URINALYSIS, ROUTINE W REFLEX MICROSCOPIC
Bacteria, UA: NONE SEEN
Bilirubin Urine: NEGATIVE
GLUCOSE, UA: NEGATIVE mg/dL
HGB URINE DIPSTICK: NEGATIVE
KETONES UR: NEGATIVE mg/dL
NITRITE: NEGATIVE
PH: 6 (ref 5.0–8.0)
PROTEIN: NEGATIVE mg/dL
SPECIFIC GRAVITY, URINE: 1.006 (ref 1.005–1.030)

## 2016-10-03 LAB — RAPID URINE DRUG SCREEN, HOSP PERFORMED
Amphetamines: NOT DETECTED
BARBITURATES: NOT DETECTED
Benzodiazepines: POSITIVE — AB
COCAINE: NOT DETECTED
Opiates: NOT DETECTED
TETRAHYDROCANNABINOL: NOT DETECTED

## 2016-10-03 LAB — PREGNANCY, URINE: Preg Test, Ur: NEGATIVE

## 2016-10-03 LAB — I-STAT BETA HCG BLOOD, ED (MC, WL, AP ONLY): HCG, QUANTITATIVE: 6.2 m[IU]/mL — AB (ref <5)
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
## 2016-10-03 NOTE — ED Notes (Signed)
Patient is laying in bed sleeping at this time. Before then patient was calm
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
## 2016-10-03 NOTE — ED Notes (Signed)
Bed: WTR5 Expected date:  Expected time:  Means of arrival:  Comments: 

## 2016-10-03 NOTE — ED Notes (Signed)
Attempted to drawl labs x 2 without success.
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
## 2016-10-03 NOTE — ED Provider Notes (Signed)
Handoff from Muthersbaugh PA-C at shift change.   Pt pending medical clearance and TTS eval due to concern for manic behavior. She has underlying chronic pain.   Labs reviewed and patient is medically cleared. Seen by psych this morning -- cleared from psychiatric standpoint for discharge.   BP 123/68 (BP Location: Left Arm)   Pulse 88   Temp 98.7 F (37.1 C) (Oral)   Resp 18   Ht 5\' 4"  (1.626 m)   Wt 95.7 kg (211 lb)   LMP  (LMP Unknown)   SpO2 98%   BMI 36.22 kg/m     Renne Crigler, PA-C 10/03/16 1211    Lorre Nick, MD 10/04/16 [phone]

## 2016-10-03 NOTE — ED Notes (Addendum)
Pt stated "Are you going to wrap the baby in a warm blanket when it comes out?  Can I get something for pain?  I was just @ [facility] and they gave me hydrocodone and Tramadol 4."  Informed would speak to EDP r/t request for pain meds.

## 2016-10-03 NOTE — [facility] Suicide Risk Assessment (Signed)
Suicide Risk Assessment  Discharge Assessment   [facility] Discharge Suicide Risk Assessment   Principal Problem: MDD (major depressive disorder), recurrent episode, severe (HCC) Discharge Diagnoses:  Patient Active Problem List   Diagnosis Date Noted  . Opioid abuse [F11.10]   . Polysubstance abuse [F19.10]   . MDD (major depressive disorder), recurrent episode, severe (HCC) [F33.2] 09/21/2016  . Bipolar I disorder, most recent episode depressed (HCC) [F31.30] 05/16/2016  . History of trigeminal neuralgia [Z86.69] 05/16/2016  . Benzodiazepine dependence (HCC) [F13.20] 05/16/2016  . Alcohol use disorder, moderate, dependence (HCC) [F10.20] 05/16/2016  . Positive hepatitis C antibody test [R76.8] 12/24/2015  . Bipolar 1 disorder (HCC) [F31.9] 12/23/2015  . Trigeminal neuralgia [G50.0] 12/23/2015  . Overdose [T50.901A] 12/22/2015  . Baclofen overdose [T42.8X1A] 10/28/2015  . 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 Acute respiratory failure with hypoxemia (HCC) [J96.01]   . Bipolar I disorder, most recent episode mixed (HCC) [F31.60] 06/14/2015  . Essential hypertension [I10]   . Sinus bradycardia [R00.1] 06/10/2015  . Prolonged QT interval [R94.31] 05/03/2015  . Morbid obesity due to excess calories (HCC) [E66.01]   . GERD (gastroesophageal reflux disease) [K21.9]   . Migraine without aura and without status migrainosus, not intractable [G43.009]   . Overdose [T50.901A] 03/23/2015  . History of hepatitis C virus infection [Z86.19] 02/26/2015  . Acute encephalopathy [G93.40] 02/24/2015  . 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 Right facial pain [R51] 01/05/2015  . Polypharmacy [Z79.899] 01/18/2012    Class: Chronic  . Borderline personality disorder [F60.3] 01/18/2012  . Hypothyroidism [E03.9] 09/16/2011    Total Time spent with patient: 45 minutes  Musculoskeletal: Strength & Muscle Tone: within normal limits Gait & Station:  normal Patient leans: N/A  Psychiatric Specialty Exam: Physical Exam  Constitutional: She is oriented to person, place, and time. She appears well-developed and well-nourished.  Obese   HENT:  Head: Normocephalic.  Respiratory: Effort normal.  Musculoskeletal: Normal range of motion.  Neurological: She is alert and oriented to person, place, and time.   Review of Systems  Psychiatric/Behavioral: Positive for depression and substance abuse. Negative for hallucinations, memory loss and suicidal ideas. The patient is not nervous/anxious and does not have insomnia.   All other systems reviewed and are negative.  Blood pressure 123/68, pulse 88, temperature 98.7 F (37.1 C), temperature source Oral, resp. rate 18, height 5\' 4"  (1.626 m), weight 95.7 kg (211 lb), SpO2 98 %.Body mass index is 36.22 kg/m. General Appearance: Casual Eye Contact:  Good Speech:  Clear and Coherent and Slow Volume:  Decreased Mood:  Anxious and Depressed Affect:  Congruent and Depressed Thought Process:  Coherent, Goal Directed and Linear Orientation:  Full (Time, Place, and Person) Thought Content:  Obsessions Suicidal Thoughts:  No Homicidal Thoughts:  No Memory:  Immediate;   Good Recent;   Fair Remote;   Fair Judgement:  Poor Insight:  Shallow Psychomotor Activity:  Decreased Concentration:  Concentration: Fair and Attention Span: Fair Recall:  Good Fund of Knowledge:  Good Language:  Good Akathisia:  No Handed:  Right AIMS (if indicated):    Assets:  Architect Housing Resilience ADL's:  Intact Cognition:  WNL  Mental Status Per Nursing Assessment::   On Admission:   demanding narcotics  Demographic Factors:  Caucasian  Loss Factors: Loss of significant relationship and Legal issues  Historical Factors: Prior suicide attempts and Impulsivity  Risk Reduction Factors:   Sense of responsibility to  family and Living with another person,  especially a relative  Continued Clinical Symptoms:  Bipolar Disorder:   Depressive phase Depression:   Impulsivity Alcohol/Substance Abuse/Dependencies Personality Disorders:   Cluster B Previous Psychiatric Diagnoses and Treatments  Cognitive Features That Contribute To Risk:  Closed-mindedness    Suicide Risk:  Minimal: No identifiable suicidal ideation.  Patients presenting with no risk factors but with morbid ruminations; may be classified as minimal risk based on the severity of the depressive symptoms    Plan Of Care/Follow-up recommendations:  Activity:  as tolerated Diet:  Heart Healthy  Laveda Abbe, NP 10/03/2016, 12:00 PM

## 2016-10-03 NOTE — ED Notes (Signed)
Pt stated "my fiancee got mad at me and started throwing things.  My dog is mean.  Look at what all he does to my arms.  John's thing is drinking but I don't think I can help him.  I got some people looking at some houses close to [place].  I'm going to lose our house.  Samantha's my daughter & she has been adopted.  John got into [facility] but he didn't got because he's too scared.  He's an [place] professor.  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  I want him to stop drinking.  He's had so many nice sponsors.  I have an appt. With the lawyer tomorrow.  We have community rehabilitation from the city to redo our home and everything.   Somebody says we can stay if the other person dies so we have to go talk to [place] and one of [place] will have to leave.  I can't take it.  It's not fair and he knows it.  I just found out he's been lying to me for 2 years."

## 2016-11-21 IMAGING — CR DG CHEST 1V PORT
1 series · 1 of 1 positions shown · non-contrast
Comparison: 02/26/2015

CLINICAL DATA: Acute respiratory failure.  On ventilator.

EXAM:
PORTABLE CHEST 1 VIEW

[AP]
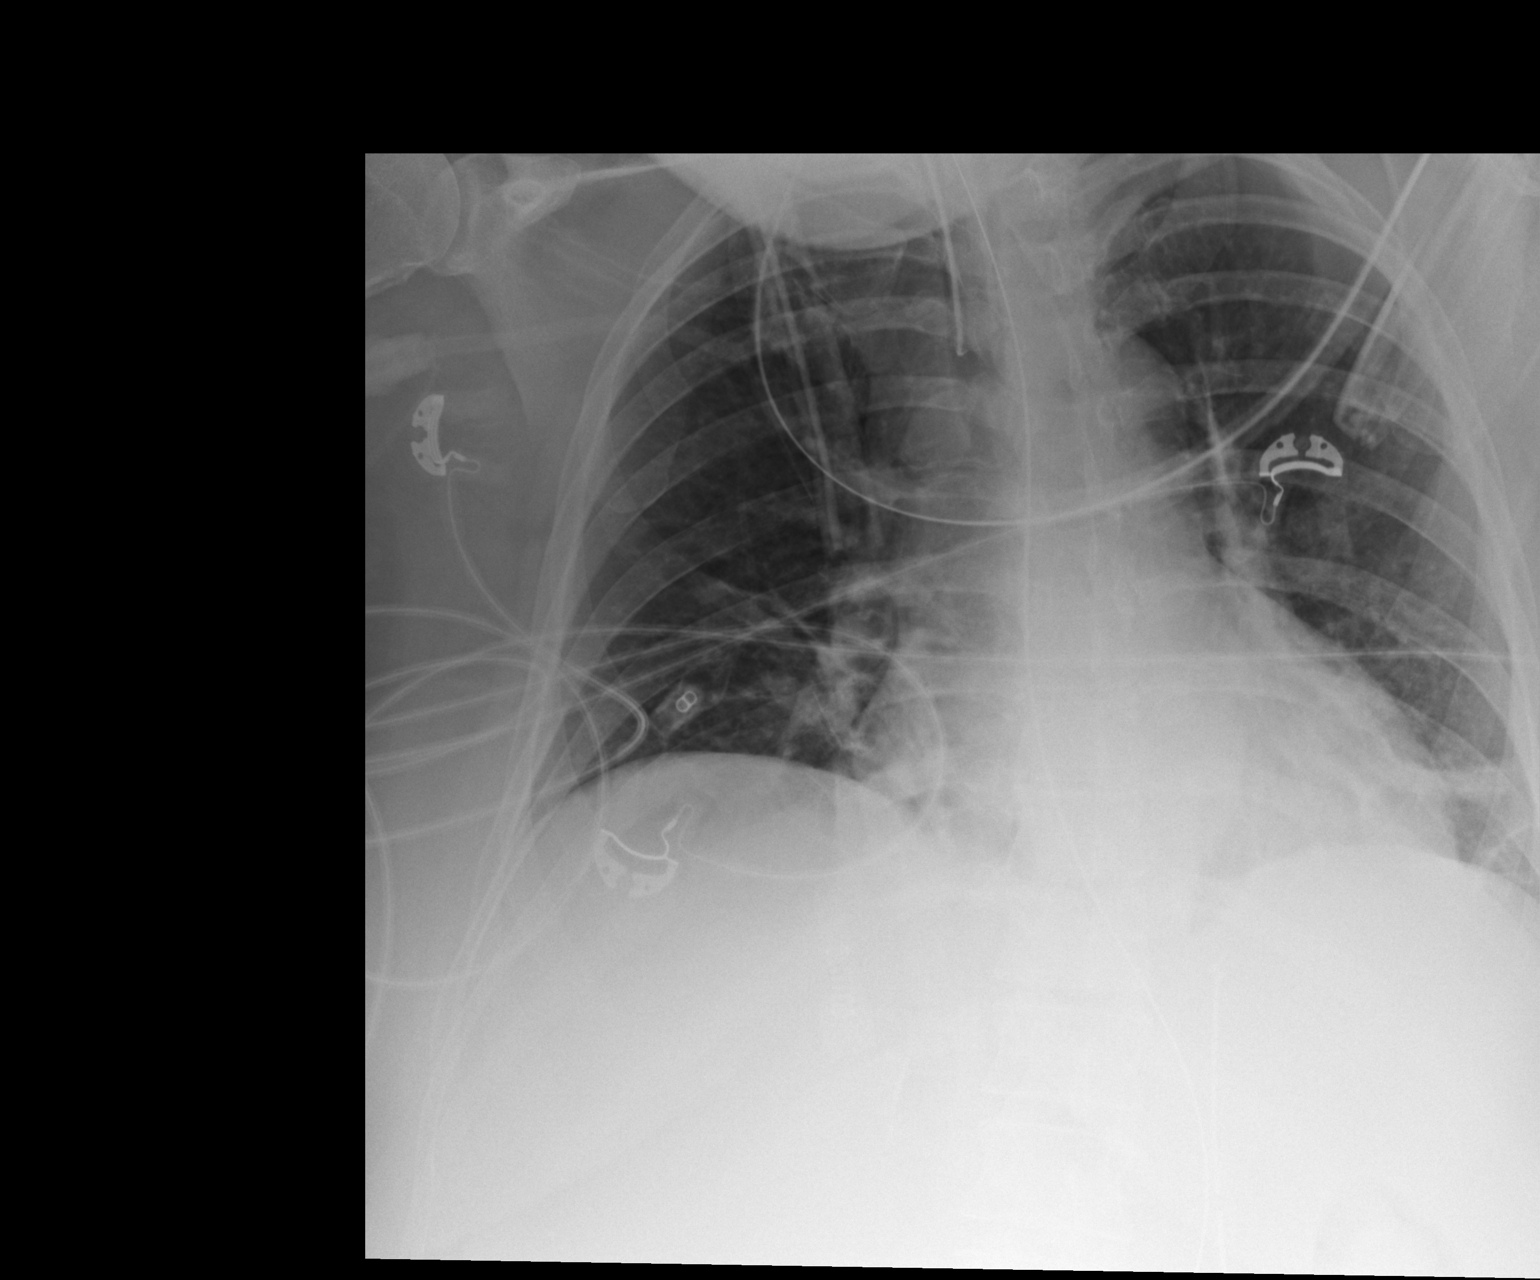

[1 of 1 positions shown; findings below may reference images not displayed]

FINDINGS: Endotracheal tube and nasogastric tube are in appropriate position.

Bibasilar subsegmental atelectasis is seen which shows improvement
in the left lung base since previous study. No evidence of pulmonary
consolidation or pleural effusion. Heart size is within normal
limits. No pneumothorax visualized.
IMPRESSION: Bibasilar subsegmental atelectasis, with interval improvement seen
in left lung base.

## 2016-11-21 IMAGING — CR DG ABD PORTABLE 1V
1 series · 1 of 1 positions shown · non-contrast
Comparison: Lumbar spine radiographs 03/13/2012. Chest radiographs
today.

CLINICAL DATA: Orogastric tube placement.

EXAM:
PORTABLE ABDOMEN - 1 VIEW

[AP]
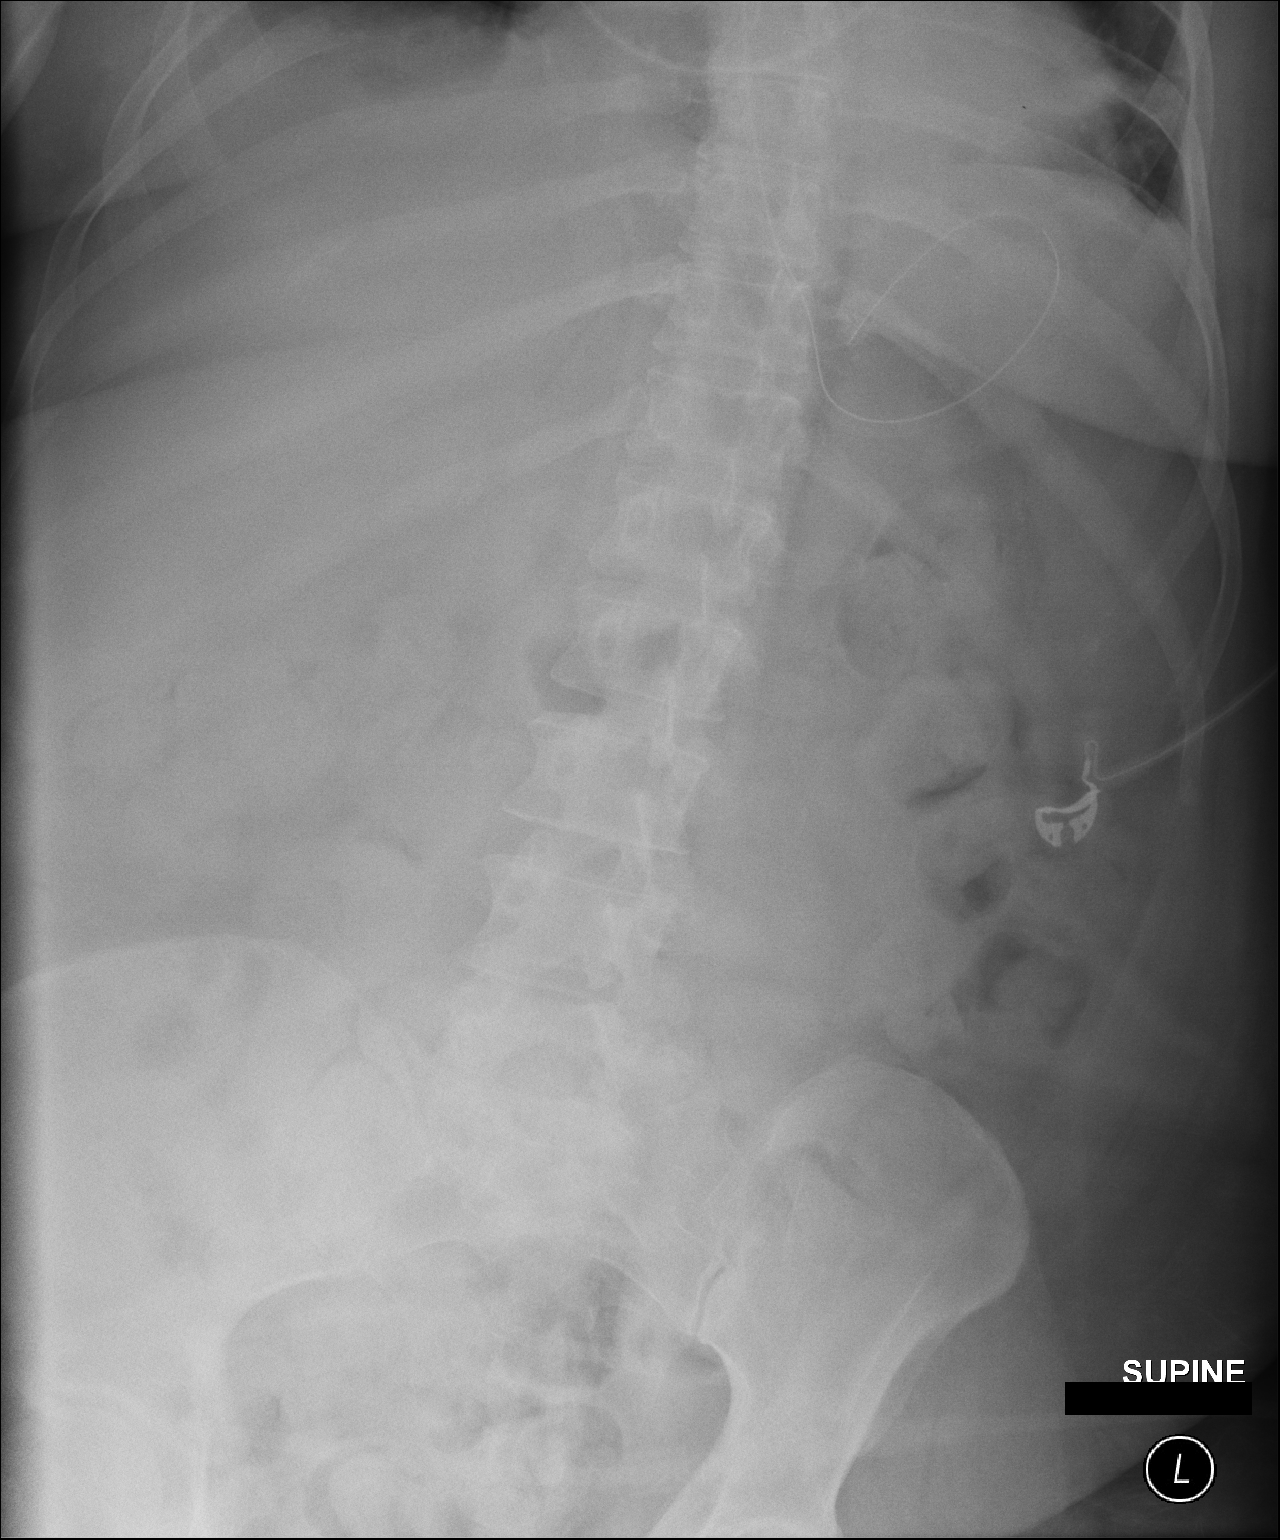

[1 of 1 positions shown; findings below may reference images not displayed]

FINDINGS: 4021 hours. Orogastric tube tip is looped in the left upper quadrant
of the abdomen, likely in the proximal stomach. The visualized bowel
gas pattern is nonobstructive. There is hypoaeration of the lung
bases with asymmetric density on the right which could reflect a
right pleural effusion.
IMPRESSION: Nasogastric tube tip in the left upper quadrant of the abdomen,
likely in the proximal stomach.

## 2016-11-23 IMAGING — CR DG CHEST 1V PORT
1 series · 1 of 1 positions shown · non-contrast
Comparison: Yesterday

CLINICAL DATA: Endotracheal tube

EXAM:
PORTABLE CHEST 1 VIEW

[AP]
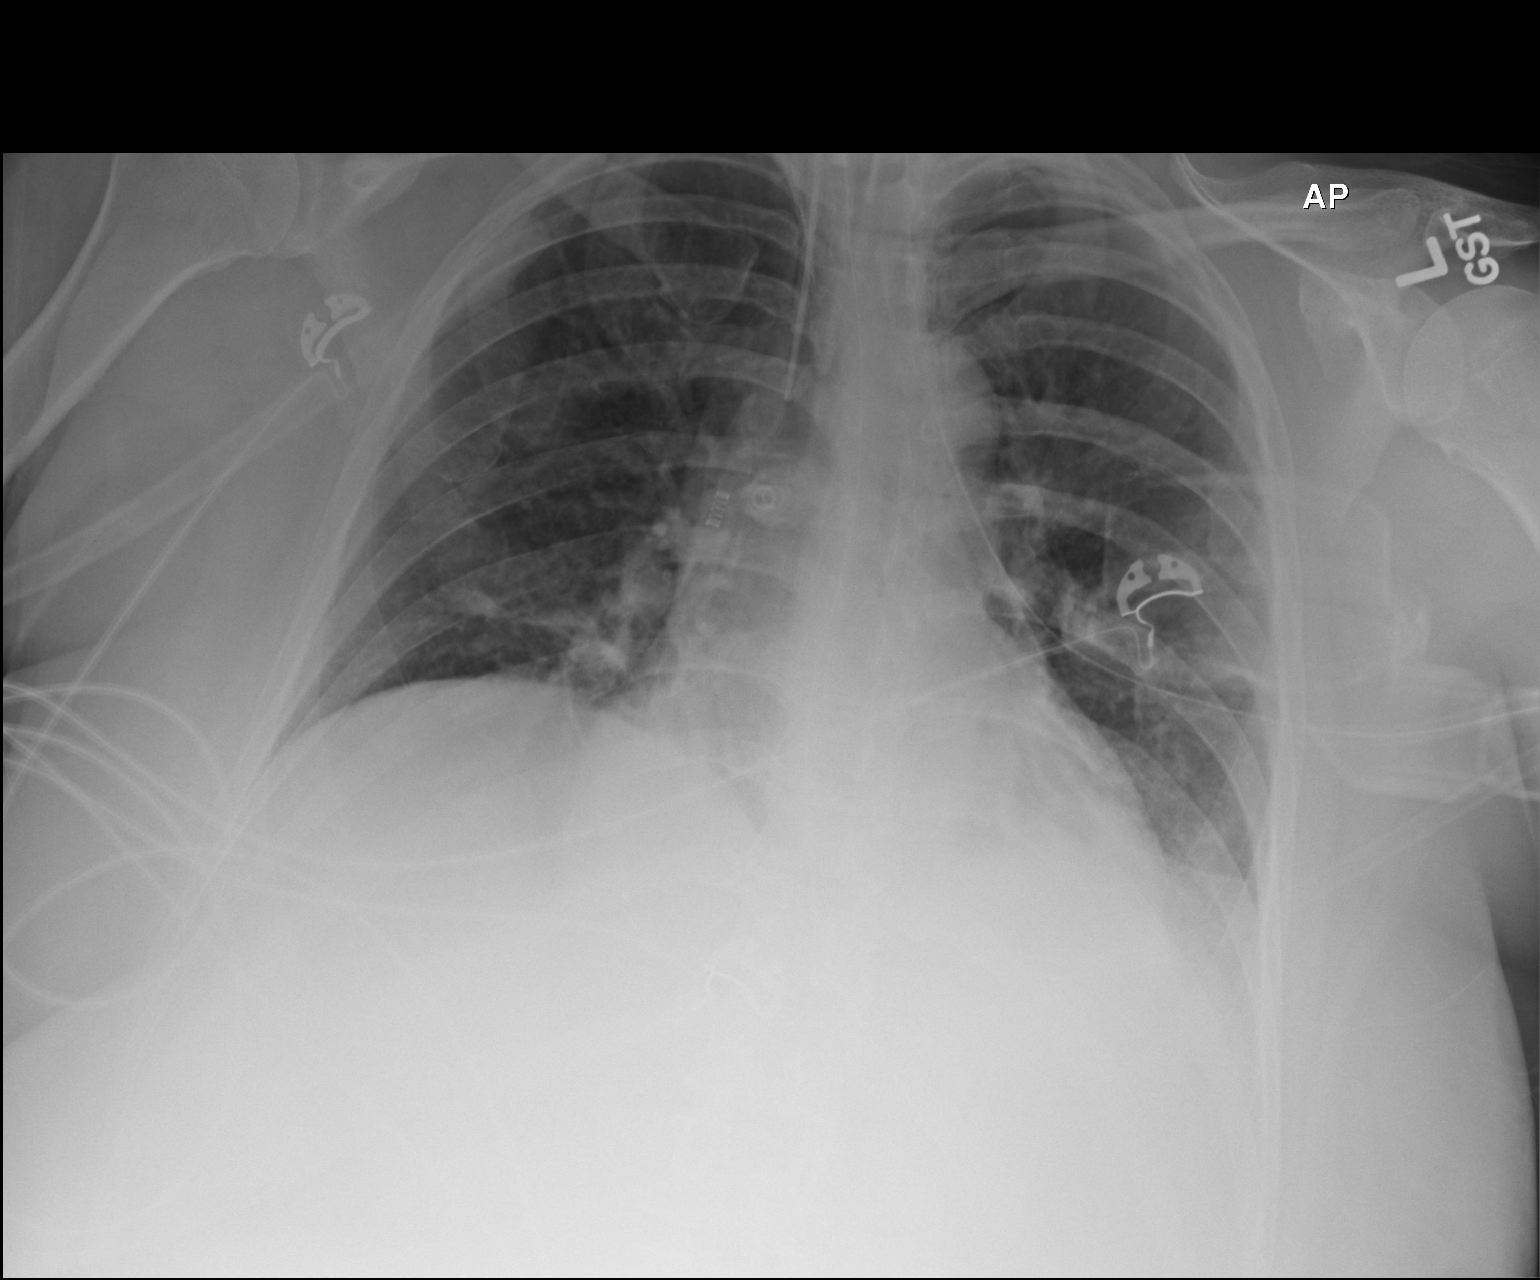

[1 of 1 positions shown; findings below may reference images not displayed]

FINDINGS: Endotracheal tube tip is 15 mm above the carina, different
appearance from prior likely from patient positioning. The
orogastric tube reaches the stomach at least.

Low volumes with streaky basilar opacities, improved with better
visualization of the central diaphragm. No edema, effusion, or air
leak. Borderline cardiomegaly. Stable aortic contours.
IMPRESSION: 1. Similar positioning of endotracheal and orogastric tubes.
2. Improving basilar atelectasis.

## 2016-12-23 ENCOUNTER — Emergency Department (HOSPITAL_COMMUNITY)
Admission: EM | Admit: 2016-12-23 | Discharge: 2016-12-23 | Disposition: A | Discharge status: Home / Self Care | Payer: Medicaid Other | Attending: Emergency Medicine | Admitting: Emergency Medicine

## 2016-12-23 ENCOUNTER — Other Ambulatory Visit: Payer: Self-pay

## 2016-12-23 ENCOUNTER — Emergency Department (HOSPITAL_COMMUNITY): Payer: Medicaid Other

## 2016-12-23 DIAGNOSIS — E039 Hypothyroidism, unspecified: Secondary | ICD-10-CM | POA: Insufficient documentation

## 2016-12-23 DIAGNOSIS — Y929 Unspecified place or not applicable: Secondary | ICD-10-CM | POA: Diagnosis not present

## 2016-12-23 DIAGNOSIS — Z23 Encounter for immunization: Secondary | ICD-10-CM | POA: Diagnosis not present

## 2016-12-23 DIAGNOSIS — S0121XD Laceration without foreign body of nose, subsequent encounter: Secondary | ICD-10-CM | POA: Insufficient documentation

## 2016-12-23 DIAGNOSIS — I10 Essential (primary) hypertension: Secondary | ICD-10-CM | POA: Insufficient documentation

## 2016-12-23 DIAGNOSIS — T07XXXA Unspecified multiple injuries, initial encounter: Secondary | ICD-10-CM | POA: Diagnosis not present

## 2016-12-23 DIAGNOSIS — Y9389 Activity, other specified: Secondary | ICD-10-CM | POA: Diagnosis not present

## 2016-12-23 DIAGNOSIS — Y998 Other external cause status: Secondary | ICD-10-CM | POA: Insufficient documentation

## 2016-12-23 DIAGNOSIS — W540XXA Bitten by dog, initial encounter: Secondary | ICD-10-CM | POA: Insufficient documentation

## 2016-12-23 DIAGNOSIS — S51852A Open bite of left forearm, initial encounter: Secondary | ICD-10-CM | POA: Insufficient documentation

## 2016-12-23 DIAGNOSIS — S51851A Open bite of right forearm, initial encounter: Secondary | ICD-10-CM | POA: Insufficient documentation

## 2016-12-23 DIAGNOSIS — F17200 Nicotine dependence, unspecified, uncomplicated: Secondary | ICD-10-CM | POA: Insufficient documentation

## 2016-12-23 DIAGNOSIS — Z79899 Other long term (current) drug therapy: Secondary | ICD-10-CM | POA: Insufficient documentation

## 2016-12-23 MED ORDER — HYDROGEN PEROXIDE 3 % EX SOLN
CUTANEOUS | Status: AC
Start: 2016-12-23 — End: 2016-12-23
  Administered 2016-12-23: 06:00:00
  Filled 2016-12-23: qty 473

## 2016-12-23 MED ORDER — ACETAMINOPHEN 500 MG PO TABS
1000.0000 mg | ORAL_TABLET | Freq: Once | ORAL | Status: AC
  Administered 2016-12-23: 1000 mg via ORAL
  Filled 2016-12-23: qty 2

## 2016-12-23 MED ORDER — AMOXICILLIN-POT CLAVULANATE 875-125 MG PO TABS: 1.0000 | ORAL_TABLET | Freq: Two times a day (BID) | ORAL | 0 refills | Status: DC

## 2016-12-23 MED ORDER — SODIUM CHLORIDE 0.9 % IV SOLN
3.0000 g | Freq: Once | INTRAVENOUS | Status: AC
  Administered 2016-12-23: 3 g via INTRAVENOUS
  Filled 2016-12-23: qty 3

## 2016-12-23 MED ORDER — MIDAZOLAM HCL 2 MG/2ML IJ SOLN
5.0000 mg | Freq: Once | INTRAMUSCULAR | Status: AC
  Administered 2016-12-23: 5 mg via INTRAMUSCULAR
  Filled 2016-12-23: qty 6

## 2016-12-23 MED ORDER — TETANUS-DIPHTH-ACELL PERTUSSIS 5-2.5-18.5 LF-MCG/0.5 IM SUSP
0.5000 mL | Freq: Once | INTRAMUSCULAR | Status: AC
  Administered 2016-12-23: 0.5 mL via INTRAMUSCULAR
  Filled 2016-12-23: qty 0.5

## 2016-12-23 MED ORDER — TRAMADOL HCL 50 MG PO TABS
100.0000 mg | ORAL_TABLET | Freq: Once | ORAL | Status: AC
  Administered 2016-12-23: 100 mg via ORAL
  Filled 2016-12-23: qty 2

## 2016-12-23 MED ORDER — BACITRACIN ZINC 500 UNIT/GM EX OINT
TOPICAL_OINTMENT | Freq: Two times a day (BID) | CUTANEOUS | Status: DC
  Filled 2016-12-23: qty 1.8

## 2016-12-23 NOTE — ED Notes (Signed)
Pt come out to desk yelling at the nursing staff and holding a bag of supplies that she chose to pack and take with her. She was instructed to leave the supplies at desk and return to room or leave facility. Security called, pt resistant to listen to instructions, Security told her to listen and then they would show her out. After being told the importance of filling antibiotic script and start them this afternoon she stated "I'll get them tomorrow" Dr Read DriversMolpus and writer told her she needs to start them this afternoon. She was allowed to have 4 bandaids to take with her. She then made the excuse she does not know what bus to take. Security escorted her to the lobby to call her friend to find out, then she is to leave the property.

## 2016-12-23 NOTE — ED Notes (Signed)
This Clinical research associatewriter was unable toobtain the pt's vital signs upon arrival due to the pt's behavior, pt was very agitated and confused

## 2016-12-23 NOTE — ED Triage Notes (Signed)
Pt arriving by EMS from home with multiple dog bites on right lower arm and right hand. Bleeding is controlled. Pt A&O x4. Pt acting erratically, very anxious. Pt has hx of mental illness.

## 2016-12-23 NOTE — ED Notes (Signed)
Pt. Pulled IV out by accident.

## 2016-12-23 NOTE — ED Notes (Signed)
Bed: [id number] Expected date:  Expected time:  Means of arrival:  Comments: EMS dog bite

## 2016-12-23 NOTE — ED Provider Notes (Signed)
[facility]-EMERGENCY DEPT Provider Note: Brittany Dell, MD, FACEP  CSN: [id number] MRN: [id number] ARRIVAL: 12/23/16 at 0045 ROOM: WA13/WA13   CHIEF COMPLAINT  Animal Bite  Level 5 caveat: Altered mental status HISTORY OF PRESENT ILLNESS  12/23/16 4:10 AM Brittany [name] is a 51 y.o. female with a history of psychiatric illness.  She is brought in by EMS after being bitten by her own dog late yesterday evening.  She has multiple puncture wounds to her forearms, right greater than left.  She also has swelling and ecchymosis to her right hand.  She is agitated and incoherent on arrival which the police state is not unusual for her known psychiatric illness.  She was given 5 mg of Versed IM with relief of her agitation and she is now able to cooperate.  She also has a healing laceration to the bridge of her nose which is from a reported fall last month.   Past Medical History:  Diagnosis Date  . Anxiety   . Bipolar 1 disorder (HCC)   . Bulging lumbar disc   . Chronic bronchitis (HCC)   . Chronic lower back pain   . Depression   . Facial nerve injury   . Facial pain 11/2013   walking dog when she tripped and "faceplanted" landing on her face.   Marland Kitchen GERD (gastroesophageal reflux disease)   . Goiter   . Head injury   . Heart murmur   . Hyperlipidemia   . Hypertension   . Hypothyroidism   . Migraine    "weekly" (03/23/2015)  . Ruptured intervertebral disc   . Seizures (HCC)    "I've had a few; had a grand mal when I took Neurotin; still take Neurotin" (03/23/2015)  . Short-term memory loss     Past Surgical History:  Procedure Laterality Date  . ABDOMINAL HYSTERECTOMY    . CESAREAN SECTION  2004  . EYE SURGERY Right 11/2013   emergent lateral canthotomy due to proptosis  [name] 11/30/2013  . TONSILLECTOMY      Family History  Problem Relation Age of Onset  . Migraines Mother   . Bipolar disorder Mother   . Migraines Father   . Other Father   . Parkinson's disease Father    . Migraines Sister     Social History   Tobacco Use  . Smoking status: Current Every Day Smoker  . Smokeless tobacco: Never Used  Substance Use Topics  . Alcohol use: No    Comment: 03/23/2015 "might drink at Christmastime"  . Drug use: Yes    Types: Marijuana    Comment: 03/23/2015 ""haven't smoked marijuana for years"    Prior to Admission medications   Medication Sig Start Date End Date Taking? Authorizing Provider  acetaminophen (TYLENOL) 325 MG tablet Take 325-650 mg by mouth every 6 (six) hours as needed for mild pain or headache.    [provider]  amitriptyline (ELAVIL) 25 MG tablet Take 1 tablet (25 mg total) by mouth at bedtime. 09/25/16   Thermon Leyland, NP  amitriptyline (ELAVIL) 50 MG tablet Take 50 mg by mouth at bedtime.    [provider]  amLODipine (NORVASC) 5 MG tablet Take 1 tablet (5 mg total) by mouth daily. 09/25/16   Thermon Leyland, NP  Cariprazine HCl (VRAYLAR) 1.5 & 3 MG CPPK Take 1 capsule by mouth at bedtime.    [provider]  eszopiclone (LUNESTA) 1 MG TABS tablet Take 3 mg by mouth at bedtime. Take immediately before  bedtime    [provider]  gabapentin (NEURONTIN) 100 MG capsule Take 2 capsules (200 mg total) by mouth 2 (two) times daily. 09/25/16   Thermon Leylandavis, Laura A, NP  HYDROcodone-acetaminophen (NORCO) 10-325 MG tablet Take 1 tablet by mouth 4 (four) times daily as needed for moderate pain.     [provider]  lamoTRIgine (LAMICTAL) 100 MG tablet Take 1 tablet (100 mg total) by mouth 2 (two) times daily. Patient taking differently: Take 100 mg by mouth 2 (two) times daily as needed (for trigeminal neuralgia flare ups).  09/25/16   Thermon Leylandavis, Laura A, NP  methocarbamol (ROBAXIN) 500 MG tablet Take 2 tablets (1,000 mg total) by mouth every 8 (eight) hours as needed for muscle spasms. 09/25/16   Thermon Leylandavis, Laura A, NP  traMADol (ULTRAM) 50 MG tablet Take 50 mg by mouth 3 (three) times daily as needed (breakthorough pain.).     [provider]    Allergies Toradol [ketorolac tromethamine]; Suboxone [buprenorphine hcl-naloxone hcl]; Subutex [buprenorphine]; Tegretol [carbamazepine]; and Trazodone and nefazodone   REVIEW OF SYSTEMS     PHYSICAL EXAMINATION  Initial Vital Signs Blood pressure (!) 147/91, pulse 97, temperature 97.8 F (36.6 C), temperature source Axillary, resp. rate 18, SpO2 (!) 89 %.  Examination General: Well-developed, well-nourished female in no acute distress; appearance consistent with age of record HENT: normocephalic; healing laceration to bridge of nose without signs of infection Eyes: pupils equal, round and reactive to light; extraocular muscles grossly intact Neck: supple Heart: regular rate and rhythm Lungs: clear to auscultation bilaterally Abdomen: soft; nondistended; nontender; bowel sounds present Extremities: No deformity; full range of motion; pulses normal; multiple puncture wounds of dorsal forearms, right greater than left; ecchymosis, tenderness and swelling of dorsal right hand Neurologic: Awake, alert; motor function intact in all extremities and symmetric; no facial droop Skin: Warm and dry Psychiatric: Rambling, sometimes incoherent speech but calm and cooperative after Versed   RESULTS  Summary of this visit's results, reviewed by myself:   EKG Interpretation  Date/Time:    Ventricular Rate:    PR Interval:    QRS Duration:   QT Interval:    QTC Calculation:   R Axis:     Text Interpretation:        Laboratory Studies: No results found for this or any previous visit (from the past 24 hour(s)). Imaging Studies: Dg Hand Complete Right  Result Date: 12/23/2016 CLINICAL DATA:  Dog bite to hand tonight. EXAM: RIGHT HAND - COMPLETE 3+ VIEW COMPARISON:  None. FINDINGS: There is no evidence of fracture or dislocation. There is no evidence of arthropathy or other focal bone abnormality. Focal bubble of gas within dorsum of the hand, associated  soft tissue swelling. No radiopaque foreign bodies. IMPRESSION: Dorsal hand soft tissue swelling and gas consistent with puncture wound. No acute osseous process. Electronically Signed   By: Awilda Metroourtnay  Bloomer M.D.   On: 12/23/2016 04:39    ED COURSE  Nursing notes and initial vitals signs, including pulse oximetry, reviewed.  Vitals:   12/23/16 0245 12/23/16 0254 12/23/16 0300 12/23/16 0345  BP: (!) 142/82 (!) 142/82 (!) 147/91 (!) 148/97  Pulse: 100 98 97 97  Resp:  18    Temp:      TempSrc:      SpO2: 91% 93% (!) 89% (!) 86%   Unasyn 3g IV given for dog bite prophylaxis.  5:54 AM Patient advised that we will not be closing her wounds primarily as they are puncture wounds  and at high risk of infection.  We will provide local wound care and teach her how to change the dressings.  The right hand and forearm show ecchymosis surrounding the puncture wounds consistent with concomitant bruising.  PROCEDURES    ED DIAGNOSES     ICD-10-CM   1. Dog bite of right forearm, initial encounter S51.851A    W54.0XXA   2. Dog bite of left forearm, initial encounter D63.875IS51.852A    W54.0XXA   3. Laceration of nose, subsequent encounter S01.21XD   4. Contusion of multiple sites T07.[place]eldon NewportXXXA        Cordell Guercio, MD 12/23/16 [phone]

## 2016-12-25 ENCOUNTER — Emergency Department (HOSPITAL_COMMUNITY): Payer: Medicaid Other

## 2016-12-25 ENCOUNTER — Encounter (HOSPITAL_COMMUNITY): Payer: Self-pay

## 2016-12-25 ENCOUNTER — Other Ambulatory Visit: Payer: Self-pay

## 2016-12-25 ENCOUNTER — Inpatient Hospital Stay (HOSPITAL_COMMUNITY)
Admission: EM | Admit: 2016-12-25 | Discharge: 2016-12-27 | DRG: 917 | Disposition: A | Discharge status: Other Acute Inpatient Hospital | Payer: Medicaid Other | Attending: Family Medicine | Admitting: Family Medicine

## 2016-12-25 DIAGNOSIS — T50901A Poisoning by unspecified drugs, medicaments and biological substances, accidental (unintentional), initial encounter: Secondary | ICD-10-CM | POA: Diagnosis present

## 2016-12-25 DIAGNOSIS — Z8619 Personal history of other infectious and parasitic diseases: Secondary | ICD-10-CM | POA: Diagnosis not present

## 2016-12-25 DIAGNOSIS — F1110 Opioid abuse, uncomplicated: Secondary | ICD-10-CM | POA: Diagnosis present

## 2016-12-25 DIAGNOSIS — R7989 Other specified abnormal findings of blood chemistry: Secondary | ICD-10-CM | POA: Diagnosis present

## 2016-12-25 DIAGNOSIS — Z915 Personal history of self-harm: Secondary | ICD-10-CM | POA: Diagnosis not present

## 2016-12-25 DIAGNOSIS — R450 Nervousness: Secondary | ICD-10-CM | POA: Diagnosis not present

## 2016-12-25 DIAGNOSIS — E039 Hypothyroidism, unspecified: Secondary | ICD-10-CM | POA: Diagnosis present

## 2016-12-25 DIAGNOSIS — E785 Hyperlipidemia, unspecified: Secondary | ICD-10-CM | POA: Diagnosis present

## 2016-12-25 DIAGNOSIS — F3160 Bipolar disorder, current episode mixed, unspecified: Secondary | ICD-10-CM | POA: Diagnosis present

## 2016-12-25 DIAGNOSIS — Z6835 Body mass index (BMI) 35.0-35.9, adult: Secondary | ICD-10-CM | POA: Diagnosis not present

## 2016-12-25 DIAGNOSIS — E876 Hypokalemia: Secondary | ICD-10-CM | POA: Diagnosis not present

## 2016-12-25 DIAGNOSIS — Z62810 Personal history of physical and sexual abuse in childhood: Secondary | ICD-10-CM | POA: Diagnosis not present

## 2016-12-25 DIAGNOSIS — G92 Toxic encephalopathy: Secondary | ICD-10-CM | POA: Diagnosis present

## 2016-12-25 DIAGNOSIS — R400 Somnolence: Secondary | ICD-10-CM

## 2016-12-25 DIAGNOSIS — G8929 Other chronic pain: Secondary | ICD-10-CM | POA: Diagnosis present

## 2016-12-25 DIAGNOSIS — Z818 Family history of other mental and behavioral disorders: Secondary | ICD-10-CM | POA: Diagnosis not present

## 2016-12-25 DIAGNOSIS — F332 Major depressive disorder, recurrent severe without psychotic features: Secondary | ICD-10-CM | POA: Diagnosis present

## 2016-12-25 DIAGNOSIS — T1491XA Suicide attempt, initial encounter: Secondary | ICD-10-CM | POA: Diagnosis not present

## 2016-12-25 DIAGNOSIS — G929 Unspecified toxic encephalopathy: Secondary | ICD-10-CM | POA: Diagnosis present

## 2016-12-25 DIAGNOSIS — T50904A Poisoning by unspecified drugs, medicaments and biological substances, undetermined, initial encounter: Secondary | ICD-10-CM

## 2016-12-25 DIAGNOSIS — G500 Trigeminal neuralgia: Secondary | ICD-10-CM | POA: Diagnosis present

## 2016-12-25 DIAGNOSIS — K219 Gastro-esophageal reflux disease without esophagitis: Secondary | ICD-10-CM | POA: Diagnosis present

## 2016-12-25 DIAGNOSIS — R0600 Dyspnea, unspecified: Secondary | ICD-10-CM

## 2016-12-25 DIAGNOSIS — F17200 Nicotine dependence, unspecified, uncomplicated: Secondary | ICD-10-CM | POA: Diagnosis present

## 2016-12-25 DIAGNOSIS — B1920 Unspecified viral hepatitis C without hepatic coma: Secondary | ICD-10-CM | POA: Diagnosis present

## 2016-12-25 DIAGNOSIS — I10 Essential (primary) hypertension: Secondary | ICD-10-CM | POA: Diagnosis present

## 2016-12-25 DIAGNOSIS — R768 Other specified abnormal immunological findings in serum: Secondary | ICD-10-CM | POA: Diagnosis present

## 2016-12-25 DIAGNOSIS — G9340 Encephalopathy, unspecified: Secondary | ICD-10-CM | POA: Diagnosis not present

## 2016-12-25 DIAGNOSIS — Z79899 Other long term (current) drug therapy: Secondary | ICD-10-CM

## 2016-12-25 DIAGNOSIS — F4310 Post-traumatic stress disorder, unspecified: Secondary | ICD-10-CM | POA: Diagnosis present

## 2016-12-25 DIAGNOSIS — T428X2A Poisoning by antiparkinsonism drugs and other central muscle-tone depressants, intentional self-harm, initial encounter: Secondary | ICD-10-CM | POA: Diagnosis not present

## 2016-12-25 DIAGNOSIS — J690 Pneumonitis due to inhalation of food and vomit: Secondary | ICD-10-CM | POA: Diagnosis present

## 2016-12-25 DIAGNOSIS — T428X1A Poisoning by antiparkinsonism drugs and other central muscle-tone depressants, accidental (unintentional), initial encounter: Secondary | ICD-10-CM | POA: Diagnosis present

## 2016-12-25 DIAGNOSIS — F17210 Nicotine dependence, cigarettes, uncomplicated: Secondary | ICD-10-CM | POA: Diagnosis not present

## 2016-12-25 DIAGNOSIS — T50902D Poisoning by unspecified drugs, medicaments and biological substances, intentional self-harm, subsequent encounter: Secondary | ICD-10-CM | POA: Diagnosis not present

## 2016-12-25 DIAGNOSIS — Z560 Unemployment, unspecified: Secondary | ICD-10-CM | POA: Diagnosis not present

## 2016-12-25 DIAGNOSIS — F1210 Cannabis abuse, uncomplicated: Secondary | ICD-10-CM | POA: Diagnosis not present

## 2016-12-25 LAB — RAPID URINE DRUG SCREEN, HOSP PERFORMED
AMPHETAMINES: NOT DETECTED
BARBITURATES: NOT DETECTED
BENZODIAZEPINES: POSITIVE — AB
Cocaine: NOT DETECTED
Opiates: NOT DETECTED
Tetrahydrocannabinol: NOT DETECTED

## 2016-12-25 LAB — COMPREHENSIVE METABOLIC PANEL
ALBUMIN: 4.3 g/dL (ref 3.5–5.0)
ALT: 102 U/L — ABNORMAL HIGH (ref 14–54)
AST: 243 U/L — AB (ref 15–41)
Alkaline Phosphatase: 91 U/L (ref 38–126)
Anion gap: 10 (ref 5–15)
BUN: 28 mg/dL — AB (ref 6–20)
CHLORIDE: 107 mmol/L (ref 101–111)
CO2: 22 mmol/L (ref 22–32)
Calcium: 8.7 mg/dL — ABNORMAL LOW (ref 8.9–10.3)
Creatinine, Ser: 1.34 mg/dL — ABNORMAL HIGH (ref 0.44–1.00)
GFR calc Af Amer: 53 mL/min — ABNORMAL LOW (ref >60)
GFR calc non Af Amer: 46 mL/min — ABNORMAL LOW (ref >60)
Glucose, Bld: 90 mg/dL (ref 65–99)
POTASSIUM: 3.8 mmol/L (ref 3.5–5.1)
SODIUM: 139 mmol/L (ref 135–145)
Total Bilirubin: 1.2 mg/dL (ref 0.3–1.2)
Total Protein: 7.9 g/dL (ref 6.5–8.1)

## 2016-12-25 LAB — I-STAT CHEM 8, ED
BUN: 38 mg/dL — ABNORMAL HIGH (ref 6–20)
CALCIUM ION: 1.13 mmol/L — AB (ref 1.15–1.40)
CHLORIDE: 108 mmol/L (ref 101–111)
Creatinine, Ser: 1.3 mg/dL — ABNORMAL HIGH (ref 0.44–1.00)
GLUCOSE: 91 mg/dL (ref 65–99)
HCT: 28 % — ABNORMAL LOW (ref 36.0–46.0)
HEMOGLOBIN: 9.5 g/dL — AB (ref 12.0–15.0)
Potassium: 4 mmol/L (ref 3.5–5.1)
SODIUM: 143 mmol/L (ref 135–145)
TCO2: 24 mmol/L (ref 22–32)

## 2016-12-25 LAB — URINALYSIS, ROUTINE W REFLEX MICROSCOPIC
BILIRUBIN URINE: NEGATIVE
Glucose, UA: NEGATIVE mg/dL
Ketones, ur: 5 mg/dL — AB
LEUKOCYTES UA: NEGATIVE
Nitrite: NEGATIVE
Protein, ur: 100 mg/dL — AB
Specific Gravity, Urine: 1.016 (ref 1.005–1.030)
pH: 5 (ref 5.0–8.0)

## 2016-12-25 LAB — CBC WITH DIFFERENTIAL/PLATELET
BASOS ABS: 0 10*3/uL (ref 0.0–0.1)
Basophils Relative: 0 %
EOS ABS: 0.2 10*3/uL (ref 0.0–0.7)
EOS PCT: 2 %
HCT: 28.5 % — ABNORMAL LOW (ref 36.0–46.0)
Hemoglobin: 9.4 g/dL — ABNORMAL LOW (ref 12.0–15.0)
LYMPHS ABS: 1 10*3/uL (ref 0.7–4.0)
Lymphocytes Relative: 11 %
MCH: 29.6 pg (ref 26.0–34.0)
MCHC: 33 g/dL (ref 30.0–36.0)
MCV: 89.6 fL (ref 78.0–100.0)
Monocytes Absolute: 0.7 10*3/uL (ref 0.1–1.0)
Monocytes Relative: 8 %
Neutro Abs: 7.5 10*3/uL (ref 1.7–7.7)
Neutrophils Relative %: 79 %
PLATELETS: 251 10*3/uL (ref 150–400)
RBC: 3.18 MIL/uL — AB (ref 3.87–5.11)
RDW: 13.9 % (ref 11.5–15.5)
WBC: 9.5 10*3/uL (ref 4.0–10.5)

## 2016-12-25 LAB — ACETAMINOPHEN LEVEL
Acetaminophen (Tylenol), Serum: <10 ug/mL — ABNORMAL LOW (ref 10–30)
Acetaminophen (Tylenol), Serum: <10 ug/mL — ABNORMAL LOW (ref 10–30)

## 2016-12-25 LAB — I-STAT BETA HCG BLOOD, ED (MC, WL, AP ONLY)

## 2016-12-25 LAB — MRSA PCR SCREENING: MRSA by PCR: NEGATIVE

## 2016-12-25 LAB — ETHANOL: Alcohol, Ethyl (B): <10 mg/dL (ref <10)

## 2016-12-25 LAB — I-STAT CG4 LACTIC ACID, ED: Lactic Acid, Venous: 0.87 mmol/L (ref 0.5–1.9)

## 2016-12-25 LAB — SALICYLATE LEVEL

## 2016-12-25 LAB — TSH: TSH: 1.919 u[IU]/mL (ref 0.350–4.500)

## 2016-12-25 MED ORDER — VITAMINS A & D EX OINT
TOPICAL_OINTMENT | CUTANEOUS | Status: AC
  Administered 2016-12-25: 5
  Filled 2016-12-25: qty 5

## 2016-12-25 MED ORDER — NALOXONE HCL 2 MG/2ML IJ SOSY
1.0000 mg | PREFILLED_SYRINGE | Freq: Once | INTRAMUSCULAR | Status: AC
  Administered 2016-12-25: 1 mg via INTRAVENOUS
  Filled 2016-12-25: qty 2

## 2016-12-25 MED ORDER — ONDANSETRON HCL 4 MG PO TABS: 4.0000 mg | ORAL_TABLET | Freq: Four times a day (QID) | ORAL | Status: DC | PRN

## 2016-12-25 MED ORDER — SODIUM CHLORIDE 0.9 % IV SOLN
1.5000 g | Freq: Once | INTRAVENOUS | Status: AC
  Administered 2016-12-25: 1.5 g via INTRAVENOUS
  Filled 2016-12-25: qty 1.5

## 2016-12-25 MED ORDER — ENOXAPARIN SODIUM 40 MG/0.4ML ~~LOC~~ SOLN
40.0000 mg | SUBCUTANEOUS | Status: DC
  Administered 2016-12-25 – 2016-12-26 (×2): 40 mg via SUBCUTANEOUS
  Filled 2016-12-25 (×2): qty 0.4

## 2016-12-25 MED ORDER — SODIUM CHLORIDE 0.9 % IV SOLN
1.5000 g | Freq: Three times a day (TID) | INTRAVENOUS | Status: DC
  Administered 2016-12-25 – 2016-12-26 (×2): 1.5 g via INTRAVENOUS
  Filled 2016-12-25 (×4): qty 1.5

## 2016-12-25 MED ORDER — ONDANSETRON HCL 4 MG/2ML IJ SOLN: 4.0000 mg | Freq: Four times a day (QID) | INTRAMUSCULAR | Status: DC | PRN

## 2016-12-25 MED ORDER — SODIUM CHLORIDE 0.9 % IV BOLUS (SEPSIS)
1000.0000 mL | Freq: Once | INTRAVENOUS | Status: AC
  Administered 2016-12-25: 1000 mL via INTRAVENOUS

## 2016-12-25 MED ORDER — VITAMINS A & D EX OINT
TOPICAL_OINTMENT | CUTANEOUS | Status: AC
  Administered 2016-12-26: 01:00:00
  Filled 2016-12-25: qty 15

## 2016-12-25 MED ORDER — SODIUM CHLORIDE 0.9 % IV SOLN
INTRAVENOUS | Status: DC
  Administered 2016-12-25: 17:00:00 via INTRAVENOUS

## 2016-12-25 MED ORDER — TRAMADOL HCL 50 MG PO TABS
50.0000 mg | ORAL_TABLET | Freq: Once | ORAL | Status: AC
  Administered 2016-12-25: 50 mg via ORAL
  Filled 2016-12-25: qty 1

## 2016-12-25 NOTE — ED Notes (Signed)
When getting patient dressed in gown, pt had a pill stuck to her breast that was identified as clonazepam.

## 2016-12-25 NOTE — ED Notes (Signed)
Patient's fiance John called, I attempted to let patient speak with him on telephone. Patient could not wake up long enough to talk to him. John stated "last time I called all I was told was that she was there and stable." I explained to him that due to HIPPA privacy, that's all we can tell over the phone." Jonny RuizJohn stated " It is financially impossible to come there. Do you understand?" Wants us to have her call one of the numbers below. John cell- [phone] Elinor cell [phone]

## 2016-12-25 NOTE — ED Provider Notes (Signed)
[facility]-ICU/STEPDOWN Provider Note   CSN: [id number] Arrival date & time: 12/25/16  [id number]     History   Chief Complaint Chief Complaint  Patient presents with  . Altered Mental Status    HPI Brittany [name] is a 51 y.o. female.  HPI   51 year old female with history of bipolar disorder, hypertension, hyperlipidemia, history of suicidal ideation with multiple suicide attempts in the past, including 2 suicide attempts in the last year that required intubation, who presents with concern for altered mental status.  Patient's neighbor had called EMS because she had been acting appropriately.  On EMS arrival, she told them that she had taken tramadol for pain.  She was given 4 mg of Narcan in route without improvement.  They placed an NPA in her left nare, did react to pain.  History is limited by patient's altered mental status. States "ouch" to sternal rub, does not answer other questions.    Had a dog bite 2 days ago, was given antibiotics  Boyfriend reported she took baclofen.   Past Medical History:  Diagnosis Date  . Anxiety   . Bipolar 1 disorder (HCC)   . Bulging lumbar disc   . Chronic bronchitis (HCC)   . Chronic lower back pain   . Depression   . Facial nerve injury   . Facial pain 11/2013   walking dog when she tripped and "faceplanted" landing on her face.   Marland Kitchen. GERD (gastroesophageal reflux disease)   . Goiter   . Head injury   . Heart murmur   . Hyperlipidemia   . Hypertension   . Hypothyroidism   . Migraine    "weekly" (03/23/2015)  . Ruptured intervertebral disc   . Seizures (HCC)    "I've had a few; had a grand mal when I took Neurotin; still take Neurotin" (03/23/2015)  . Short-term memory loss     Patient Active Problem List   Diagnosis Date Noted  . Toxic encephalopathy 12/25/2016  . Opioid abuse (HCC)   . Polysubstance abuse (HCC)   . MDD (major depressive disorder), recurrent episode, severe (HCC) 09/21/2016  . Bipolar  I disorder, most recent episode depressed (HCC) 05/16/2016  . History of trigeminal neuralgia 05/16/2016  . Benzodiazepine dependence (HCC) 05/16/2016  . Alcohol use disorder, moderate, dependence (HCC) 05/16/2016  . Positive hepatitis C antibody test 12/24/2015  . Bipolar 1 disorder (HCC) 12/23/2015  . Trigeminal neuralgia 12/23/2015  . Overdose 12/22/2015  . Baclofen overdose 10/28/2015  . Severe episode of recurrent major depressive disorder, without psychotic features (HCC)   . Acute respiratory failure with hypoxemia (HCC)   . Bipolar I disorder, most recent episode mixed (HCC) 06/14/2015  . Essential hypertension   . Sinus bradycardia 06/10/2015  . Prolonged QT interval 05/03/2015  . Morbid obesity due to excess calories (HCC)   . GERD (gastroesophageal reflux disease)   . Migraine without aura and without status migrainosus, not intractable   . Overdose 03/23/2015  . History of hepatitis C virus infection 02/26/2015  . Acute encephalopathy 02/24/2015  . Facial nerve injury   . Right facial pain 01/05/2015  . Polypharmacy 01/18/2012    Class: Chronic  . Borderline personality disorder (HCC) 01/18/2012  . Hypothyroidism 09/16/2011    Past Surgical History:  Procedure Laterality Date  . ABDOMINAL HYSTERECTOMY    . CESAREAN SECTION  2004  . EYE SURGERY Right 11/2013   emergent lateral canthotomy due to proptosis  [name]/notes 11/30/2013  . TONSILLECTOMY  OB History    No data available       Home Medications    Prior to Admission medications   Medication Sig Start Date End Date Taking? Authorizing Provider  acetaminophen (TYLENOL) 325 MG tablet Take 325-650 mg by mouth every 6 (six) hours as needed for mild pain or headache.    [provider]  amitriptyline (ELAVIL) 25 MG tablet Take 1 tablet (25 mg total) by mouth at bedtime. 09/25/16   Thermon Leyland, NP  amitriptyline (ELAVIL) 50 MG tablet Take 50 mg by mouth at bedtime.    [provider]    amLODipine (NORVASC) 5 MG tablet Take 1 tablet (5 mg total) by mouth daily. 09/25/16   Thermon Leyland, NP  amoxicillin-clavulanate (AUGMENTIN) 875-125 MG tablet Take 1 tablet 2 (two) times daily by mouth. One po bid x 7 days 12/23/16   Molpus, John, MD  Cariprazine HCl (VRAYLAR) 1.5 & 3 MG CPPK Take 1 capsule by mouth at bedtime.    [provider]  eszopiclone (LUNESTA) 1 MG TABS tablet Take 3 mg by mouth at bedtime. Take immediately before bedtime    [provider]  gabapentin (NEURONTIN) 100 MG capsule Take 2 capsules (200 mg total) by mouth 2 (two) times daily. 09/25/16   Thermon Leyland, NP  HYDROcodone-acetaminophen (NORCO) 10-325 MG tablet Take 1 tablet by mouth 4 (four) times daily as needed for moderate pain.     [provider]  lamoTRIgine (LAMICTAL) 100 MG tablet Take 1 tablet (100 mg total) by mouth 2 (two) times daily. Patient taking differently: Take 100 mg by mouth 2 (two) times daily as needed (for trigeminal neuralgia flare ups).  09/25/16   Thermon Leyland, NP  methocarbamol (ROBAXIN) 500 MG tablet Take 2 tablets (1,000 mg total) by mouth every 8 (eight) hours as needed for muscle spasms. 09/25/16   Thermon Leyland, NP  traMADol (ULTRAM) 50 MG tablet Take 50 mg by mouth 3 (three) times daily as needed (breakthorough pain.).    [provider]    Family History Family History  Problem Relation Age of Onset  . Migraines Mother   . Bipolar disorder Mother   . Migraines Father   . Other Father   . Parkinson's disease Father   . Migraines Sister     Social History Social History   Tobacco Use  . Smoking status: Current Every Day Smoker  . Smokeless tobacco: Never Used  Substance Use Topics  . Alcohol use: No    Comment: 03/23/2015 "might drink at Christmastime"  . Drug use: Yes    Types: Marijuana    Comment: 03/23/2015 ""haven't smoked marijuana for years"     Allergies   Toradol [ketorolac tromethamine]; Suboxone [buprenorphine  hcl-naloxone hcl]; Subutex [buprenorphine]; Tegretol [carbamazepine]; and Trazodone and nefazodone   Review of Systems Review of Systems  Unable to perform ROS: Mental status change  Constitutional: Positive for fatigue.  Skin: Positive for wound.     Physical Exam Updated Vital Signs BP (!) 170/96 (BP Location: Right Arm)   Pulse 93   Temp 98.3 F (36.8 C) (Oral)   Resp 14   Ht 5\' 4"  (1.626 m)   Wt 92.6 kg (204 lb 2.3 oz)   LMP  (LMP Unknown)   SpO2 100%   BMI 35.04 kg/m   Physical Exam  Constitutional: She appears well-developed and well-nourished. She has a sickly appearance. She appears ill.  HENT:  Head: Normocephalic and atraumatic.  Eyes: Conjunctivae and EOM are normal.  Neck: Normal range of motion.  Cardiovascular: Normal rate, regular rhythm, normal heart sounds and intact distal pulses. Exam reveals no gallop and no friction rub.  No murmur heard. Pulmonary/Chest: Effort normal. No respiratory distress. She has no wheezes. She has rhonchi. She has no rales.  Abdominal: Soft. She exhibits no distension. There is no tenderness. There is no guarding.  Musculoskeletal: She exhibits no edema or tenderness.  Neurological: GCS eye subscore is 3. GCS verbal subscore is 4. GCS motor subscore is 5.  Moves all 4 extremities  Skin: Skin is warm and dry. No rash noted. She is not diaphoretic. No erythema.  Multiple dog bites over bilateral arms, surrounding contusions, no purulence, no drainage, no erythema Multiple abrasions and scratches to bilateral lower extremities  Nursing note and vitals reviewed.    ED Treatments / Results  Labs (all labs ordered are listed, but only abnormal results are displayed) Labs Reviewed  COMPREHENSIVE METABOLIC PANEL - Abnormal; Notable for the following components:      Result Value   BUN 28 (*)    Creatinine, Ser 1.34 (*)    Calcium 8.7 (*)    AST 243 (*)    ALT 102 (*)    GFR calc non Af Amer 46 (*)    GFR calc Af Amer 53  (*)    All other components within normal limits  RAPID URINE DRUG SCREEN, HOSP PERFORMED - Abnormal; Notable for the following components:   Benzodiazepines POSITIVE (*)    All other components within normal limits  CBC WITH DIFFERENTIAL/PLATELET - Abnormal; Notable for the following components:   RBC 3.18 (*)    Hemoglobin 9.4 (*)    HCT 28.5 (*)    All other components within normal limits  ACETAMINOPHEN LEVEL - Abnormal; Notable for the following components:   Acetaminophen (Tylenol), Serum <10 (*)    All other components within normal limits  URINALYSIS, ROUTINE W REFLEX MICROSCOPIC - Abnormal; Notable for the following components:   Hgb urine dipstick MODERATE (*)    Ketones, ur 5 (*)    Protein, ur 100 (*)    Bacteria, UA RARE (*)    Squamous Epithelial / LPF 0-5 (*)    All other components within normal limits  ACETAMINOPHEN LEVEL - Abnormal; Notable for the following components:   Acetaminophen (Tylenol), Serum <10 (*)    All other components within normal limits  I-STAT CHEM 8, ED - Abnormal; Notable for the following components:   BUN 38 (*)    Creatinine, Ser 1.30 (*)    Calcium, Ion 1.13 (*)    Hemoglobin 9.5 (*)    HCT 28.0 (*)    All other components within normal limits  MRSA PCR SCREENING  ETHANOL  SALICYLATE LEVEL  TSH  COMPREHENSIVE METABOLIC PANEL  CBC  I-STAT BETA HCG BLOOD, ED ([facility], [facility], [facility] ONLY)  I-STAT CG4 LACTIC ACID, ED    EKG  EKG Interpretation  Date/Time:  Monday December 25 2016 08:44:52 EST Ventricular Rate:  99 PR Interval:    QRS Duration: 89 QT Interval:  395 QTC Calculation: 507 R Axis:   -22 Text Interpretation:  Sinus rhythm Borderline left axis deviation Borderline T abnormalities, anterior leads Borderline prolonged QT interval No significant change since last tracing Confirmed by Alvira Monday ([id number]) on 12/25/2016 1:29:31 PM       Radiology Dg Chest Port 1 View  Result Date: 12/25/2016 CLINICAL DATA:  Somnolence  EXAM: PORTABLE  CHEST 1 VIEW COMPARISON:  09/20/2016 FINDINGS: Low volume chest with hazy density at the medial right base. There is streaky opacity at the left base consistent with atelectasis. No edema, effusion, or pneumothorax. Normal heart size. Stable mediastinal contours accounting for rotation. IMPRESSION: Right base opacity that could be atelectasis, pneumonia, or aspiration in this setting. Lung volumes are low. Electronically Signed   By: Marnee SpringJonathon  Watts M.D.   On: 12/25/2016 09:25    Procedures Procedures (including critical care time)  Medications Ordered in ED Medications  enoxaparin (LOVENOX) injection 40 mg (not administered)  0.9 %  sodium chloride infusion ( Intravenous New Bag/Given 12/25/16 1716)  ampicillin-sulbactam (UNASYN) 1.5 g in sodium chloride 0.9 % 50 mL IVPB (not administered)  sodium chloride 0.9 % bolus 1,000 mL (0 mLs Intravenous Stopped 12/25/16 1431)  naloxone (NARCAN) injection 1 mg (1 mg Intravenous Given 12/25/16 0942)  ampicillin-sulbactam (UNASYN) 1.5 g in sodium chloride 0.9 % 50 mL IVPB (0 g Intravenous Stopped 12/25/16 1128)     Initial Impression / Assessment and Plan / ED Course  I have reviewed the triage vital signs and the nursing notes.  Pertinent labs & imaging results that were available during my care of the patient were reviewed by me and considered in my medical decision making (see chart for details).     51 year old female with history of bipolar disorder, hypertension, hyperlipidemia, history of suicidal ideation with multiple suicide attempts in the past, including 2 suicide attempts in the last year that required intubation, who presents with concern for altered mental status.  On arrival to the ED, is localizing, opening eyes, and saying "ouch" to painful stimuli, able to move all 4 extremities. Dog bites do not appear infected at this time, patient afebrile, doubt sepsis.  Suspect drug overdose. Patient reported taking tramadol for  pain, however clonazepam was found in her bra, and given her history of prior drug overdoses and medication list, overdose could be variety of other medications including likely benzodiazepam.    Ordered tylenol, salicylate level, CBC, CMP.  Placed on end tidal CO2. Protecting airway on arrival but will continue to monitor. Given narcan 1mg  without relief.   Labs show normal lactic acid, no leukocytosis, stable anemia.  CXR shows right base opacity, atelectasis versus possible aspiration.  Gave unasyn for concern for aspiration given somnolence, which will also provide continued coverage for dog bites. Urinalysis without signs of infection.  Boyfriend reports she took baclofen.  Transaminases slight elevation. Repeat tylenol level also negative, do not feel NAC is indicated given no known tylenol ingestion and negative levels.  Other labs WNL.  Patient admitted for continuing somnolence in setting of overdose and would recommend continued coverage for possible aspiration pneumonia/dog bite coverage as previously prescribed with augmentin or unasyn.      Final Clinical Impressions(s) / ED Diagnoses   Final diagnoses:  Somnolence  Drug overdose, undetermined intent, initial encounter    ED Discharge Orders    None       Alvira MondaySchlossman, Cale Bethard, MD 12/25/16 1717

## 2016-12-25 NOTE — Plan of Care (Signed)
  Progressing Education: Knowledge of General Education information will improve 12/25/2016 1640 - Progressing by Charleen KirksMcNabb, Eleora Sutherland N, RN Clinical Measurements: Ability to maintain clinical measurements within normal limits will improve 12/25/2016 1640 - Progressing by Charleen KirksMcNabb, Gazella Anglin N, RN Activity: Risk for activity intolerance will decrease 12/25/2016 1640 - Progressing by Charleen KirksMcNabb, Dianca Owensby N, RN

## 2016-12-25 NOTE — Progress Notes (Signed)
Pharmacy Antibiotic Note  Roanna RaiderKimberley Conkright is a 51 y.o. female admitted on 12/25/2016 with aspiriation PNA.  Pharmacy has been consulted for Unasyn dosing.  She received Unasyn 1.5 gm this morning at 1025 am.  WBC WNL, hypothermic, Cr 1.3. CXR: R base opacity that could be atelectassis, PNA or aspiration.    Plan: Unasyn 1.5 gm IV q8h Pharmacy to sign off    Temp (24hrs), Avg:96.6 F (35.9 C), Min:96.6 F (35.9 C), Max:96.6 F (35.9 C)  Recent Labs  Lab 12/25/16 0934 12/25/16 0943  WBC 9.5  --   CREATININE 1.34* 1.30*  LATICACIDVEN  --  0.87    CrCl cannot be calculated (Unknown ideal weight.).    Allergies  Allergen Reactions  . Toradol [Ketorolac Tromethamine] Swelling and Other (See Comments)    Arms and stomach became swollen  . Suboxone [Buprenorphine Hcl-Naloxone Hcl] Other (See Comments)    Cellulitis results, if taken  . Subutex [Buprenorphine] Rash  . Tegretol [Carbamazepine] Rash  . Trazodone And Nefazodone Other (See Comments)    Bladder infection results, if taken   Thank you for allowing pharmacy to be a part of this patient's care. Herby AbrahamMichelle T. Kekoa Fyock, Pharm.D. [phone] 12/25/2016 3:03 PM

## 2016-12-25 NOTE — H&P (Signed)
History and Physical    Brittany [name] ZOX:[id number] DOB: 1965-07-25 DOA: 12/25/2016  PCP: [name], [name], MD  Patient coming from: Home  Chief Complaint: Unable to obtain given lethargy  HPI: Brittany [name] is a 51 y.o. female with medical history significant of bipolar with depression, hypertension, chronic pain who presented to the emergency department with acute mental status change.  Of note, patient is currently lethargic, thus detailed history cannot be obtained from the patient directly.  Briefly, patient was seen in the emergency department on 12/23/2016 following dog bite to the right forearm.  She was given a dose of Unasyn and was ultimately discharged home.  On the morning of admission, patient was found by neighbors to be acting strangely at which point EMS was called.  In route to the emergency department, patient was noted to become apneic.  Patient was given 4 mg Narcan in route to the emergency department with no significant improvement.  ED Course: In the emergency department, urinalysis was unremarkable.  Chest x-ray was notable for right base opacity suggesting aspiration pneumonia.  Patient was started on Unasyn. Also, patient, patient was noted to have mildly elevated liver function test with AST of 243 and ALT of 102.  Patient was continued on a nonrebreather mask while in the emergency department.  Given concerns of mental status changes, hospitalist service consulted for consideration for admission.  Review of Systems:  Review of Systems  Unable to perform ROS: Mental acuity    Past Medical History:  Diagnosis Date  . Anxiety   . Bipolar 1 disorder (HCC)   . Bulging lumbar disc   . Chronic bronchitis (HCC)   . Chronic lower back pain   . Depression   . Facial nerve injury   . Facial pain 11/2013   walking dog when she tripped and "faceplanted" landing on her face.   Marland Kitchen. GERD (gastroesophageal reflux disease)   . Goiter   . Head injury   . Heart  murmur   . Hyperlipidemia   . Hypertension   . Hypothyroidism   . Migraine    "weekly" (03/23/2015)  . Ruptured intervertebral disc   . Seizures (HCC)    "I've had a few; had a grand mal when I took Neurotin; still take Neurotin" (03/23/2015)  . Short-term memory loss     Past Surgical History:  Procedure Laterality Date  . ABDOMINAL HYSTERECTOMY    . CESAREAN SECTION  2004  . EYE SURGERY Right 11/2013   emergent lateral canthotomy due to proptosis  [name]/notes 11/30/2013  . TONSILLECTOMY       reports that she has been smoking.  she has never used smokeless tobacco. She reports that she uses drugs. Drug: Marijuana. She reports that she does not drink alcohol.  Allergies  Allergen Reactions  . Toradol [Ketorolac Tromethamine] Swelling and Other (See Comments)    Arms and stomach became swollen  . Suboxone [Buprenorphine Hcl-Naloxone Hcl] Other (See Comments)    Cellulitis results, if taken  . Subutex [Buprenorphine] Rash  . Tegretol [Carbamazepine] Rash  . Trazodone And Nefazodone Other (See Comments)    Bladder infection results, if taken    Family History  Problem Relation Age of Onset  . Migraines Mother   . Bipolar disorder Mother   . Migraines Father   . Other Father   . Parkinson's disease Father   . Migraines Sister     Prior to Admission medications   Medication Sig Start Date End Date Taking? Authorizing Provider  acetaminophen (TYLENOL) 325 MG tablet Take 325-650 mg by mouth every 6 (six) hours as needed for mild pain or headache.    [provider]  amitriptyline (ELAVIL) 25 MG tablet Take 1 tablet (25 mg total) by mouth at bedtime. 09/25/16   Thermon Leylandavis, Laura A, NP  amitriptyline (ELAVIL) 50 MG tablet Take 50 mg by mouth at bedtime.    [provider]  amLODipine (NORVASC) 5 MG tablet Take 1 tablet (5 mg total) by mouth daily. 09/25/16   Thermon Leylandavis, Laura A, NP  amoxicillin-clavulanate (AUGMENTIN) 875-125 MG tablet Take 1 tablet 2 (two) times daily by  mouth. One po bid x 7 days 12/23/16   Molpus, John, MD  Cariprazine HCl (VRAYLAR) 1.5 & 3 MG CPPK Take 1 capsule by mouth at bedtime.    [provider]  eszopiclone (LUNESTA) 1 MG TABS tablet Take 3 mg by mouth at bedtime. Take immediately before bedtime    [provider]  gabapentin (NEURONTIN) 100 MG capsule Take 2 capsules (200 mg total) by mouth 2 (two) times daily. 09/25/16   Thermon Leylandavis, Laura A, NP  HYDROcodone-acetaminophen (NORCO) 10-325 MG tablet Take 1 tablet by mouth 4 (four) times daily as needed for moderate pain.     [provider]  lamoTRIgine (LAMICTAL) 100 MG tablet Take 1 tablet (100 mg total) by mouth 2 (two) times daily. Patient taking differently: Take 100 mg by mouth 2 (two) times daily as needed (for trigeminal neuralgia flare ups).  09/25/16   Thermon Leylandavis, Laura A, NP  methocarbamol (ROBAXIN) 500 MG tablet Take 2 tablets (1,000 mg total) by mouth every 8 (eight) hours as needed for muscle spasms. 09/25/16   Thermon Leylandavis, Laura A, NP  traMADol (ULTRAM) 50 MG tablet Take 50 mg by mouth 3 (three) times daily as needed (breakthorough pain.).    [provider]    Physical Exam: Vitals:   12/25/16 0900 12/25/16 1110 12/25/16 1350  BP: 129/84 (!) 145/89 139/90  Pulse: 87 79 86  Resp: (!) 22 18 18   Temp: (!) 96.6 F (35.9 C)    TempSrc: Rectal    SpO2: 100% 99% 100%    Constitutional: NAD, calm, comfortable, patient lethargic however arousable to painful stimuli Vitals:   12/25/16 0900 12/25/16 1110 12/25/16 1350  BP: 129/84 (!) 145/89 139/90  Pulse: 87 79 86  Resp: (!) 22 18 18   Temp: (!) 96.6 F (35.9 C)    TempSrc: Rectal    SpO2: 100% 99% 100%   Eyes: PERRL, lids and conjunctivae normal ENMT: Mucous membranes are moist. Posterior pharynx clear of any exudate or lesions.Normal dentition.  Neck: normal, supple, no masses, no thyromegaly Respiratory: clear to auscultation bilaterally, no wheezing, no crackles. Normal respiratory effort. No  accessory muscle use.  Cardiovascular: Regular rate and rhythm, no murmurs / rubs / gallops. No extremity edema. 2+ pedal pulses. No carotid bruits.  Abdomen: no tenderness, no masses palpated. No hepatosplenomegaly. Bowel sounds positive.  Musculoskeletal: no clubbing / cyanosis. No joint deformity upper and lower extremities. Good ROM, no contractures. Normal muscle tone.  Skin: no rashes, lesions, ulcers. No induration Neurologic: Able to fully assess given patient's mental status.  No tremors or active seizures noted Psychiatric: Difficult to assess given patient's mental status   Labs on Admission: I have personally reviewed following labs and imaging studies  CBC: Recent Labs  Lab 12/25/16 0934 12/25/16 0943  WBC 9.5  --   NEUTROABS 7.5  --   HGB 9.4* 9.5*  HCT  28.5* 28.0*  MCV 89.6  --   PLT 251  --    Basic Metabolic Panel: Recent Labs  Lab 12/25/16 0934 12/25/16 0943  NA 139 143  K 3.8 4.0  CL 107 108  CO2 22  --   GLUCOSE 90 91  BUN 28* 38*  CREATININE 1.34* 1.30*  CALCIUM 8.7*  --    GFR: CrCl cannot be calculated (Unknown ideal weight.). Liver Function Tests: Recent Labs  Lab 12/25/16 0934  AST 243*  ALT 102*  ALKPHOS 91  BILITOT 1.2  PROT 7.9  ALBUMIN 4.3   No results for input(s): LIPASE, AMYLASE in the last 168 hours. No results for input(s): AMMONIA in the last 168 hours. Coagulation Profile: No results for input(s): INR, PROTIME in the last 168 hours. Cardiac Enzymes: No results for input(s): CKTOTAL, CKMB, CKMBINDEX, TROPONINI in the last 168 hours. BNP (last 3 results) No results for input(s): PROBNP in the last 8760 hours. HbA1C: No results for input(s): HGBA1C in the last 72 hours. CBG: No results for input(s): GLUCAP in the last 168 hours. Lipid Profile: No results for input(s): CHOL, HDL, LDLCALC, TRIG, CHOLHDL, LDLDIRECT in the last 72 hours. Thyroid Function Tests: No results for input(s): TSH, T4TOTAL, FREET4, T3FREE,  THYROIDAB in the last 72 hours. Anemia Panel: No results for input(s): VITAMINB12, FOLATE, FERRITIN, TIBC, IRON, RETICCTPCT in the last 72 hours. Urine analysis:    Component Value Date/Time   COLORURINE YELLOW 12/25/2016 0910   APPEARANCEUR CLEAR 12/25/2016 0910   APPEARANCEUR Clear 04/25/2011 1804   LABSPEC 1.016 12/25/2016 0910   LABSPEC 1.024 04/25/2011 1804   PHURINE 5.0 12/25/2016 0910   GLUCOSEU NEGATIVE 12/25/2016 0910   GLUCOSEU Negative 04/25/2011 1804   HGBUR MODERATE (A) 12/25/2016 0910   BILIRUBINUR NEGATIVE 12/25/2016 0910   BILIRUBINUR Negative 04/25/2011 1804   KETONESUR 5 (A) 12/25/2016 0910   PROTEINUR 100 (A) 12/25/2016 0910   UROBILINOGEN 0.2 12/23/2014 1048   NITRITE NEGATIVE 12/25/2016 0910   LEUKOCYTESUR NEGATIVE 12/25/2016 0910   LEUKOCYTESUR Negative 04/25/2011 1804   Sepsis Labs: !!!!!!!!!!!!!!!!!!!!!!!!!!!!!!!!!!!!!!!!!!!! @LABRCNTIP (procalcitonin:4,lacticidven:4) )No results found for this or any previous visit (from the past 240 hour(s)).   Radiological Exams on Admission: This x-ray personally reviewed Dg Chest Port 1 View  Result Date: 12/25/2016 CLINICAL DATA:  Somnolence EXAM: PORTABLE CHEST 1 VIEW COMPARISON:  09/20/2016 FINDINGS: Low volume chest with hazy density at the medial right base. There is streaky opacity at the left base consistent with atelectasis. No edema, effusion, or pneumothorax. Normal heart size. Stable mediastinal contours accounting for rotation. IMPRESSION: Right base opacity that could be atelectasis, pneumonia, or aspiration in this setting. Lung volumes are low. Electronically Signed   By: Marnee Spring M.D.   On: 12/25/2016 09:25    EKG: Independently reviewed.  Sinus rhythm with QTC of 507  Assessment/Plan Principal Problem:   Acute encephalopathy Active Problems:   Hypothyroidism   Polypharmacy   History of hepatitis C virus infection   Overdose   Morbid obesity due to excess calories (HCC)   Essential  hypertension   Bipolar I disorder, most recent episode mixed (HCC)   Severe episode of recurrent major depressive disorder, without psychotic features (HCC)   Positive hepatitis C antibody test   MDD (major depressive disorder), recurrent episode, severe (HCC)   Opioid abuse (HCC)   Toxic encephalopathy   1. Acute encephalopathy 1. Presents with acute encephalopathy likely secondary to unintentional medication overdose 2. Patient is able to vocalize that she took  additional doses of baclofen for pain relief, denied taking extra doses of other medications 3. Hold sedating medications 4. Patient was given narcan in the field with limited success 5. Given continued lethargy, will monitor patient closely in SDU 2. Unintentional medication overdose 1. Per above, patient is able to vocalize no suicidal or homicidal ideations 3. Hx Hep C 1. LFT's mildly elevated, likely secondary to medication overdose. Per nursing documentation, patient was noted to be apneic during transport to ED 2. Repeat LFT in AM 4. Hypothyroid 1. Not on replacement tx per home med rec 2. Will check TSH 5. Bipolar 1 disorder, depression 1. Known to psychiatry service 2. Patient has verbalized to me lack of suicidal or homicidal ideations and stated that overdose was unintentional 6. Aspiration Pneumonia 1. CXR personally reviewed with findings worrisome for aspiration PNA 2. Unasyn started in ED. Will continue.  DVT prophylaxis: Lovenox subQ  Code Status: Full Family Communication: Pt in room, family not at bedside  Disposition Plan: Uncertain at this time  Consults called:  Admission status: Inpatient, as would likely require more than 2 midnight stay to work up and correct encephalopathy   Abeni Finchum, Scheryl Marten MD [facility] Pager [phone]  If 7PM-7AM, please contact night-coverage [email or web address] Password [facility]  12/25/2016, 2:43 PM

## 2016-12-25 NOTE — ED Triage Notes (Addendum)
Per EMS-neighbors called related to patient "not acting right" and multiple dog bites-was seen for same on the 10th-states patient became apneic during transport to ED-states when EMS arrived  patient ambulated to bus without assistance, was answering questions appropriately-patient stated she took 2 tramadol for pain-was given 4 mg of Narcan in route, CBG 116, BP 190/100, HR 94-24 french NPA placed in left nare, responsive to pain, patient smells of ETOH

## 2016-12-25 NOTE — ED Notes (Signed)
Bed: RESA Expected date:  Expected time:  Means of arrival:  Comments: EMS  

## 2016-12-26 DIAGNOSIS — F1110 Opioid abuse, uncomplicated: Secondary | ICD-10-CM

## 2016-12-26 DIAGNOSIS — Z8619 Personal history of other infectious and parasitic diseases: Secondary | ICD-10-CM

## 2016-12-26 DIAGNOSIS — I10 Essential (primary) hypertension: Secondary | ICD-10-CM

## 2016-12-26 DIAGNOSIS — T50904A Poisoning by unspecified drugs, medicaments and biological substances, undetermined, initial encounter: Secondary | ICD-10-CM

## 2016-12-26 DIAGNOSIS — Z560 Unemployment, unspecified: Secondary | ICD-10-CM

## 2016-12-26 DIAGNOSIS — Z62810 Personal history of physical and sexual abuse in childhood: Secondary | ICD-10-CM

## 2016-12-26 DIAGNOSIS — G4700 Insomnia, unspecified: Secondary | ICD-10-CM

## 2016-12-26 DIAGNOSIS — F17210 Nicotine dependence, cigarettes, uncomplicated: Secondary | ICD-10-CM

## 2016-12-26 DIAGNOSIS — Z818 Family history of other mental and behavioral disorders: Secondary | ICD-10-CM

## 2016-12-26 DIAGNOSIS — G92 Toxic encephalopathy: Secondary | ICD-10-CM

## 2016-12-26 DIAGNOSIS — F1210 Cannabis abuse, uncomplicated: Secondary | ICD-10-CM

## 2016-12-26 DIAGNOSIS — F4310 Post-traumatic stress disorder, unspecified: Secondary | ICD-10-CM

## 2016-12-26 DIAGNOSIS — G9340 Encephalopathy, unspecified: Secondary | ICD-10-CM

## 2016-12-26 DIAGNOSIS — T428X2A Poisoning by antiparkinsonism drugs and other central muscle-tone depressants, intentional self-harm, initial encounter: Secondary | ICD-10-CM

## 2016-12-26 DIAGNOSIS — F3160 Bipolar disorder, current episode mixed, unspecified: Secondary | ICD-10-CM

## 2016-12-26 DIAGNOSIS — F419 Anxiety disorder, unspecified: Secondary | ICD-10-CM

## 2016-12-26 DIAGNOSIS — T1491XA Suicide attempt, initial encounter: Secondary | ICD-10-CM

## 2016-12-26 DIAGNOSIS — R450 Nervousness: Secondary | ICD-10-CM

## 2016-12-26 LAB — COMPREHENSIVE METABOLIC PANEL
ALBUMIN: 3.9 g/dL (ref 3.5–5.0)
ALK PHOS: 83 U/L (ref 38–126)
ALT: 90 U/L — ABNORMAL HIGH (ref 14–54)
ANION GAP: 9 (ref 5–15)
AST: 155 U/L — AB (ref 15–41)
BUN: 15 mg/dL (ref 6–20)
CALCIUM: 8.9 mg/dL (ref 8.9–10.3)
CO2: 24 mmol/L (ref 22–32)
Chloride: 109 mmol/L (ref 101–111)
Creatinine, Ser: 0.92 mg/dL (ref 0.44–1.00)
GFR calc Af Amer: >60 mL/min (ref >60)
GFR calc non Af Amer: >60 mL/min (ref >60)
GLUCOSE: 139 mg/dL — AB (ref 65–99)
Potassium: 3.2 mmol/L — ABNORMAL LOW (ref 3.5–5.1)
Sodium: 142 mmol/L (ref 135–145)
Total Bilirubin: 0.8 mg/dL (ref 0.3–1.2)
Total Protein: 7.3 g/dL (ref 6.5–8.1)

## 2016-12-26 LAB — CBC
HEMATOCRIT: 27.9 % — AB (ref 36.0–46.0)
HEMOGLOBIN: 9.1 g/dL — AB (ref 12.0–15.0)
MCH: 29.6 pg (ref 26.0–34.0)
MCHC: 32.6 g/dL (ref 30.0–36.0)
MCV: 90.9 fL (ref 78.0–100.0)
PLATELETS: 248 10*3/uL (ref 150–400)
RBC: 3.07 MIL/uL — ABNORMAL LOW (ref 3.87–5.11)
RDW: 14.2 % (ref 11.5–15.5)
WBC: 10.3 10*3/uL (ref 4.0–10.5)

## 2016-12-26 MED ORDER — POTASSIUM CHLORIDE CRYS ER 20 MEQ PO TBCR
80.0000 meq | EXTENDED_RELEASE_TABLET | Freq: Once | ORAL | Status: AC
  Administered 2016-12-26: 80 meq via ORAL
  Filled 2016-12-26: qty 4

## 2016-12-26 MED ORDER — SODIUM CHLORIDE 0.9 % IV SOLN
3.0000 g | Freq: Four times a day (QID) | INTRAVENOUS | Status: DC
  Administered 2016-12-26 – 2016-12-27 (×4): 3 g via INTRAVENOUS
  Filled 2016-12-26 (×5): qty 3

## 2016-12-26 MED ORDER — TRAMADOL HCL 50 MG PO TABS
50.0000 mg | ORAL_TABLET | Freq: Once | ORAL | Status: AC
  Administered 2016-12-26: 50 mg via ORAL
  Filled 2016-12-26: qty 1

## 2016-12-26 MED ORDER — LORAZEPAM 2 MG/ML IJ SOLN
0.5000 mg | Freq: Three times a day (TID) | INTRAMUSCULAR | Status: DC | PRN
  Administered 2016-12-26 – 2016-12-27 (×3): 0.5 mg via INTRAVENOUS
  Filled 2016-12-26 (×3): qty 1

## 2016-12-26 MED ORDER — GABAPENTIN 100 MG PO CAPS
200.0000 mg | ORAL_CAPSULE | Freq: Two times a day (BID) | ORAL | Status: DC
  Administered 2016-12-26 – 2016-12-27 (×2): 200 mg via ORAL
  Filled 2016-12-26 (×2): qty 2

## 2016-12-26 NOTE — Evaluation (Signed)
Clinical/Bedside Swallow Evaluation Patient Details  Name: Brittany [name] MRN: [id number] Date of Birth: [birth year]  Today's Date: 12/26/2016 Time: SLP Start Time (ACUTE ONLY): 1450 SLP Stop Time (ACUTE ONLY): 1509 SLP Time Calculation (min) (ACUTE ONLY): 19 min  Past Medical History:  Past Medical History:  Diagnosis Date  . Anxiety   . Bipolar 1 disorder (HCC)   . Bulging lumbar disc   . Chronic bronchitis (HCC)   . Chronic lower back pain   . Depression   . Facial nerve injury   . Facial pain 11/2013   walking dog when she tripped and "faceplanted" landing on her face.   Marland Kitchen. GERD (gastroesophageal reflux disease)   . Goiter   . Head injury   . Heart murmur   . Hyperlipidemia   . Hypertension   . Hypothyroidism   . Migraine    "weekly" (03/23/2015)  . Ruptured intervertebral disc   . Seizures (HCC)    "I've had a few; had a grand mal when I took Neurotin; still take Neurotin" (03/23/2015)  . Short-term memory loss    Past Surgical History:  Past Surgical History:  Procedure Laterality Date  . ABDOMINAL HYSTERECTOMY    . CESAREAN SECTION  2004  . EYE SURGERY Right 11/2013   emergent lateral canthotomy due to proptosis  [name]/notes 11/30/2013  . TONSILLECTOMY     HPI:  51 [facility] due to suspected unintentional overdose- pt took more baclofen for pain - She received Narcan but did not have significant improvement and found to be apneic in route with EMS.  Pt PMH + for anxiety, bipolar I, fall over dog with right facial pain and concern for trigeminal neuralgia, smoker, multiple overdoses.  Pt CXR concerning for aspiration pna.  Swallow evaluation ordered.     Assessment / Plan / Recommendation Clinical Impression  Pt presents with functional oropharyngeal swallow ability -She does not demonstrate signifcant symptoms/signs of dysphagia.  Pt with timely swallow and clear voice throughout. She did have subtle cough (approximately 30 seconds after) x1 after 4  ounce water test.  Small boluses clinically tolerated well.  Recommend continue diet with general precautions.  No SLP follow up needed.  Thanks.  SLP Visit Diagnosis: Dysphagia, unspecified (R13.10)    Aspiration Risk  Mild aspiration risk    Diet Recommendation Regular;Thin liquid   Liquid Administration via: Cup;Straw Medication Administration: Whole meds with liquid Supervision: Patient able to self feed Compensations: Slow rate;Small sips/bites Postural Changes: Seated upright at 90 degrees;Remain upright for at least 30 minutes after po intake    Other  Recommendations Oral Care Recommendations: Oral care BID   Follow up Recommendations None      Frequency and Duration   n/a         Prognosis   n/a     Swallow Study   General Date of Onset: 12/26/16 HPI: 51 [facility] due to suspected unintentional overdose- pt took more baclofen for pain - She received Narcan but did not have significant improvement and found to be apneic in route with EMS.  Pt PMH + for anxiety, bipolar I, fall over dog with right facial pain and concern for trigeminal neuralgia, smoker, multiple overdoses.  Pt CXR concerning for aspiration pna.  Swallow evaluation ordered.   Type of Study: Bedside Swallow Evaluation Diet Prior to this Study: Regular;Thin liquids Temperature Spikes Noted: No Respiratory Status: Room air History of Recent Intubation: No Behavior/Cognition: Alert;Cooperative;Pleasant  mood Oral Cavity Assessment: Within Functional Limits Oral Care Completed by SLP: No Oral Cavity - Dentition: Adequate natural dentition Vision: Functional for self-feeding Self-Feeding Abilities: Able to feed self Patient Positioning: Upright in bed Baseline Vocal Quality: Hoarse(mildly hoarse) Volitional Cough: Strong    Oral/Motor/Sensory Function Overall Oral Motor/Sensory Function: Within functional limits(except pt's facial discomfort on right)   Ice Chips Ice chips: Not  tested   Thin Liquid Thin Liquid: Within functional limits Presentation: Cup;Straw Other Comments: subtle cough x1 after 4 ounce water test    Nectar Thick Nectar Thick Liquid: Not tested   Honey Thick Honey Thick Liquid: Not tested   Puree Puree: Not tested   Solid   GO   Solid: Within functional limits Presentation: Self Orvan JulyFed        Demarques Pilz Ann 12/26/2016,3:28 PM  Donavan Burnetamara Naziyah Tieszen, MS [facility] SLP [phone]

## 2016-12-26 NOTE — Progress Notes (Signed)
Patient ID: Brittany [name], female   DOB: [birth year], 51 y.o.   MRN: [id number]  PROGRESS NOTE    Brittany [name]  [id number] DOB: [birth year] DOA: 12/25/2016 PCP: [name], [name], MD   Brief Narrative:  51 year old female with history of bipolar disorder with depression, hypertension, chronic pain presented with altered mental status and lethargy which did not improve with 4 mg of Narcan . She was started on IV Unasyn for probable aspiration pneumonia.  Assessment & Plan:   Principal Problem:   Acute encephalopathy Active Problems:   Hypothyroidism   Polypharmacy   History of hepatitis C virus infection   Overdose   Morbid obesity due to excess calories (HCC)   Essential hypertension   Bipolar I disorder, most recent episode mixed (HCC)   Severe episode of recurrent major depressive disorder, without psychotic features (HCC)   Positive hepatitis C antibody test   MDD (major depressive disorder), recurrent episode, severe (HCC)   Opioid abuse (HCC)   Toxic encephalopathy  Acute toxic encephalopathy - Probably from medication overdose, patient states that she unintentionally took additional doses of baclofen (baclofen not listed as her home medication, states that it's her boyfriend's; reliability of history is poor) - Monitor mental status. Currently more awake - Fall precautions  Probable aspiration pneumonia - Continue Unasyn. Follow cultures. SLP evaluation. - Repeat chest x-ray in a.m.  History of bipolar disorder with depression - Patient states that she is been under a lot of stress recently and cannot sleep - Psychiatry evaluation. Currently patient does not appear to be suicidal or homicidal  History of hepatitis C with mildly elevated LFTs - Outpatient follow-up. Monitor LFTs  Hypokalemia - Replace. Repeat a.m. labs including magnesium  Chronic pain - Outpatient follow-up with primary care provider and pain management  History of recent dog bite  to the right forearm and wrist - Doesn't look infected. Continue Unasyn for now   DVT prophylaxis: Lovenox Code Status:  Full Family Communication: None at bedside Disposition Plan: Home in 1-2 days  Consultants: psychiatry consulted   Procedures: None  Antimicrobials: Unasyn from 12/25/2016 onwards    Subjective: Patient seen and examined at bedside. She is more awake and answers questions. She states that accidentally took extra doses of baclofen. She states that she is in a lot of stress and cannot sleep. No overnight fever or vomiting.   Objective: Vitals:   12/26/16 0213 12/26/16 0300 12/26/16 0400 12/26/16 0500  BP: 136/89 (!) 134/94 (!) 146/94   Pulse: 100 99 (!) 104 92  Resp: 17 14 (!) 22   Temp:   98.1 F (36.7 C)   TempSrc:   Oral   SpO2: 100% 100% 100% 100%  Weight:      Height:        Intake/Output Summary (Last 24 hours) at 12/26/2016 0805 Last data filed at 12/26/2016 0545 Gross per 24 hour  Intake 3097.5 ml  Output 752 ml  Net 2345.5 ml   Filed Weights   12/25/16 1612  Weight: 92.6 kg (204 lb 2.3 oz)    Examination:  General exam: Appears calm and comfortable; alert and awake  Respiratory system: Bilateral decreased breath sound at bases Cardiovascular system: S1 & S2 heard, intermittent tachycardia  Gastrointestinal system: Abdomen is nondistended, soft and nontender. Normal bowel sounds heard. Extremities: No cyanosis, clubbing, edema; multiple abrasions and scratches over extremities especially on the right forearm and wrist which has multiple abrasions which look like dog bites  Data Reviewed: I have personally reviewed  following labs and imaging studies  CBC: Recent Labs  Lab 12/25/16 0934 12/25/16 0943 12/26/16 0322  WBC 9.5  --  10.3  NEUTROABS 7.5  --   --   HGB 9.4* 9.5* 9.1*  HCT 28.5* 28.0* 27.9*  MCV 89.6  --  90.9  PLT 251  --  248   Basic Metabolic Panel: Recent Labs  Lab 12/25/16 0934 12/25/16 0943 12/26/16 0322    NA 139 143 142  K 3.8 4.0 3.2*  CL 107 108 109  CO2 22  --  24  GLUCOSE 90 91 139*  BUN 28* 38* 15  CREATININE 1.34* 1.30* 0.92  CALCIUM 8.7*  --  8.9   GFR: Estimated Creatinine Clearance: 81.6 mL/min (by C-G formula based on SCr of 0.92 mg/dL). Liver Function Tests: Recent Labs  Lab 12/25/16 0934 12/26/16 0322  AST 243* 155*  ALT 102* 90*  ALKPHOS 91 83  BILITOT 1.2 0.8  PROT 7.9 7.3  ALBUMIN 4.3 3.9   No results for input(s): LIPASE, AMYLASE in the last 168 hours. No results for input(s): AMMONIA in the last 168 hours. Coagulation Profile: No results for input(s): INR, PROTIME in the last 168 hours. Cardiac Enzymes: No results for input(s): CKTOTAL, CKMB, CKMBINDEX, TROPONINI in the last 168 hours. BNP (last 3 results) No results for input(s): PROBNP in the last 8760 hours. HbA1C: No results for input(s): HGBA1C in the last 72 hours. CBG: No results for input(s): GLUCAP in the last 168 hours. Lipid Profile: No results for input(s): CHOL, HDL, LDLCALC, TRIG, CHOLHDL, LDLDIRECT in the last 72 hours. Thyroid Function Tests: Recent Labs    12/25/16 1657  TSH 1.919   Anemia Panel: No results for input(s): VITAMINB12, FOLATE, FERRITIN, TIBC, IRON, RETICCTPCT in the last 72 hours. Sepsis Labs: Recent Labs  Lab 12/25/16 0943  LATICACIDVEN 0.87    Recent Results (from the past 240 hour(s))  MRSA PCR Screening     Status: None   Collection Time: 12/25/16  4:33 PM  Result Value Ref Range Status   MRSA by PCR NEGATIVE NEGATIVE Final    Comment:        The GeneXpert MRSA Assay (FDA approved for NASAL specimens only), is one component of a comprehensive MRSA colonization surveillance program. It is not intended to diagnose MRSA infection nor to guide or monitor treatment for MRSA infections.          Radiology Studies: Dg Chest Port 1 View  Result Date: 12/25/2016 CLINICAL DATA:  Somnolence EXAM: PORTABLE CHEST 1 VIEW COMPARISON:  09/20/2016  FINDINGS: Low volume chest with hazy density at the medial right base. There is streaky opacity at the left base consistent with atelectasis. No edema, effusion, or pneumothorax. Normal heart size. Stable mediastinal contours accounting for rotation. IMPRESSION: Right base opacity that could be atelectasis, pneumonia, or aspiration in this setting. Lung volumes are low. Electronically Signed   By: Marnee SpringJonathon  Watts M.D.   On: 12/25/2016 09:25        Scheduled Meds: . enoxaparin (LOVENOX) injection  40 mg Subcutaneous Q24H   Continuous Infusions: . sodium chloride 75 mL/hr at 12/26/16 0300  . ampicillin-sulbactam (UNASYN) IV Stopped (12/26/16 0340)     LOS: 1 day        Glade LloydKshitiz Graylen Noboa, MD [facility] Pager [phone]  If 7PM-7AM, please contact night-coverage [email or web address] Password [email or web address] 12/26/2016, 8:05 AM

## 2016-12-26 NOTE — Care Management Note (Signed)
Case Management Note  Patient Details  Name: Brittany [name] MRN: [id number] Date of Birth: 10-30-1965  Subjective/Objective:                  pna and ams  Action/Plan:  Date: December 26, 2016 Marcelle SmilingRhonda Davis, BSN, [place] CityRN3, ConnecticutCCM  [phone] Chart and notes review for patient progress and needs. Will follow for case management and discharge needs. Next review date: [id number]  Expected Discharge Date:  (unknown)               Expected Discharge Plan:  Home/Self Care  In-House Referral:     Discharge planning Services  CM Consult  Post Acute Care Choice:    Choice offered to:     DME Arranged:    DME Agency:     HH Arranged:    HH Agency:     Status of Service:  In process, will continue to follow  If discussed at Long Length of Stay Meetings, dates discussed:    Additional Comments:  Golda AcreDavis, Rhonda Lynn, RN 12/26/2016, 8:51 AM
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
## 2016-12-26 NOTE — Progress Notes (Signed)
PHARMACY NOTE:  ANTIMICROBIAL RENAL DOSAGE ADJUSTMENT  Current antimicrobial regimen includes a mismatch between antimicrobial dosage and estimated renal function.  As per policy approved by the Pharmacy & Therapeutics and Medical Executive Committees, the antimicrobial dosage will be adjusted accordingly.  Current antimicrobial dosage:  Unasyn 1.5g IV q8  Indication: aspiration PNA  Renal Function:  Estimated Creatinine Clearance: 81.6 mL/min (by C-G formula based on SCr of 0.92 mg/dL). []      On intermittent HD, scheduled: []      On CRRT    Antimicrobial dosage has been changed to:  3g IV q6  Additional comments:   Thank you for allowing pharmacy to be a part of this patient's care.  Berkley HarveyLegge, Taraann Olthoff Marshall, [facility] Endosurgery CenterRPH 12/26/2016 9:02 AM

## 2016-12-26 NOTE — Progress Notes (Signed)
Pt arrived to unit from ICU via wheelchair. Pts assessment has remained the same from this mornings documented assessment. Pt denies pain at this time with no s/s of distress noted.

## 2016-12-26 NOTE — Evaluation (Signed)
Physical Therapy Evaluation Patient Details Name: Brittany RaiderKimberley [name] MRN: [id number] DOB: [birth year] Today's Date: 12/26/2016   History of Present Illness  -51 year-old female with history of bipolar disorder with depression, hypertension, chronic pain presented with altered mental status and lethargy , probable aspiration pneumonia. recent dog bites on hands and arms,memory loss.  Clinical Impression  The patient is mobile, appears near baseline for mobility. Does demonstrate decreased memory. Uncertain of Dc plan. Pt admitted with above diagnosis. Pt currently with functional limitations due to the deficits listed below (see PT Problem List).  Pt will benefit from skilled PT to increase their independence and safety with mobility to allow discharge to the venue listed below.       Follow Up Recommendations  none    Equipment Recommendations    pt. Requesting 4 wheeled RW but has steps at home.   Recommendations for Other Services       Precautions / Restrictions Precautions Precautions: Fall      Mobility  Bed Mobility Overal bed mobility: Independent                Transfers Overall transfer level: Needs assistance Equipment used: Rolling walker (2 wheeled);None Transfers: Sit to/from Stand Sit to Stand: Supervision         General transfer comment: no assist  required for balance  Ambulation/Gait Ambulation/Gait assistance: Min guard Ambulation Distance (Feet): 300 Feet Assistive device: Rolling walker (2 wheeled) Gait Pattern/deviations: Step-through pattern     General Gait Details: ambulated in room without RW and supervision.  Stairs            Wheelchair Mobility    Modified Rankin (Stroke Patients Only)       Balance Overall balance assessment: Needs assistance;History of Falls   Sitting balance-Leahy Scale: Normal       Standing balance-Leahy Scale: Good Standing balance comment: stands and performs pericare, leaning forward,  wide base.                             Pertinent Vitals/Pain Pain Assessment: Faces Faces Pain Scale: Hurts even more Pain Location: hands Pain Descriptors / Indicators: Discomfort Pain Intervention(s): Monitored during session    Home Living Family/patient expects to be discharged to:: Unsure Living Arrangements: Spouse/significant other               Additional Comments: unsure if sig other is helpful. patient stated that "he drinks"    Prior Function Level of Independence: Independent               Hand Dominance        Extremity/Trunk Assessment   Upper Extremity Assessment Upper Extremity Assessment: Defer to OT evaluation;RUE deficits/detail;LUE deficits/detail RUE Deficits / Details: guaze wrapped  arm and hands, edema noted with decreased grip  LUE Deficits / Details: guaze arapped, edema of hand    Lower Extremity Assessment Lower Extremity Assessment: Generalized weakness    Cervical / Trunk Assessment Cervical / Trunk Assessment: Normal  Communication   Communication: No difficulties  Cognition Arousal/Alertness: Awake/alert   Overall Cognitive Status: No family/caregiver present to determine baseline cognitive functioning Area of Impairment: Orientation                 Orientation Level: Time             General Comments: almost paranoid at times, noted to get quiet while walking in hall and when CNA entered room. Tangential  topics while ambulating. Stated that she has memory loss.      General Comments      Exercises     Assessment/Plan    PT Assessment Patient needs continued PT services  PT Problem List Decreased activity tolerance;Decreased balance;Decreased mobility;Decreased knowledge of precautions       PT Treatment Interventions Gait training;DME instruction;Functional mobility training;Cognitive remediation    PT Goals (Current goals can be found in the Care Plan section)  Acute Rehab PT  Goals Patient Stated Goal: to get  home PT Goal Formulation: With patient Time For Goal Achievement: 01/02/17 Potential to Achieve Goals: Good    Frequency Min 2X/week   Barriers to discharge Decreased caregiver support      Co-evaluation               AM-PAC PT "6 Clicks" Daily Activity  Outcome Measure Difficulty turning over in bed (including adjusting bedclothes, sheets and blankets)?: None Difficulty moving from lying on back to sitting on the side of the bed? : None Difficulty sitting down on and standing up from a chair with arms (e.g., wheelchair, bedside commode, etc,.)?: A Little Help needed moving to and from a bed to chair (including a wheelchair)?: A Little Help needed walking in hospital room?: A Little Help needed climbing 3-5 steps with a railing? : A Little 6 Click Score: 20    End of Session Equipment Utilized During Treatment: Gait belt Activity Tolerance: Patient tolerated treatment well Patient left: in bed;with call bell/phone within reach;with bed alarm set Nurse Communication: Mobility status PT Visit Diagnosis: Unsteadiness on feet (R26.81);History of falling (Z91.81)    Time: [id number] PT Time Calculation (min) (ACUTE ONLY): 25 min   Charges:   PT Evaluation $PT Eval Low Complexity: 1 Low PT Treatments $Gait Training: 8-22 mins   PT G CodesBlanchard Kelch:       Trueman Worlds PT [phone]  Rada [place], [place] Elizabeth 12/26/2016, 3:35 PM

## 2016-12-26 NOTE — Progress Notes (Signed)
Asked pts MD on the floor if pt needed to have an order for suicide sitter; MD declined and stated no sitter was needed even with possible OD and history of suicide attempts. Psych MD Sharma CovertNorman came by while this RN was at lunch and mentioned a sitter may be needed. Tried to return MD Normans phone call with no answer. This RN feels like a sitter is needed for pts safety as she is concerned about her boyfriend and emotional after finding out may need to [facility].

## 2016-12-27 ENCOUNTER — Inpatient Hospital Stay (HOSPITAL_COMMUNITY): Payer: Medicaid Other

## 2016-12-27 DIAGNOSIS — T50902D Poisoning by unspecified drugs, medicaments and biological substances, intentional self-harm, subsequent encounter: Secondary | ICD-10-CM

## 2016-12-27 DIAGNOSIS — E039 Hypothyroidism, unspecified: Secondary | ICD-10-CM

## 2016-12-27 DIAGNOSIS — F332 Major depressive disorder, recurrent severe without psychotic features: Secondary | ICD-10-CM

## 2016-12-27 LAB — COMPREHENSIVE METABOLIC PANEL
ALBUMIN: 3.6 g/dL (ref 3.5–5.0)
ALK PHOS: 68 U/L (ref 38–126)
ALT: 74 U/L — AB (ref 14–54)
AST: 97 U/L — AB (ref 15–41)
Anion gap: 7 (ref 5–15)
BILIRUBIN TOTAL: 1.1 mg/dL (ref 0.3–1.2)
BUN: 8 mg/dL (ref 6–20)
CO2: 24 mmol/L (ref 22–32)
CREATININE: 0.8 mg/dL (ref 0.44–1.00)
Calcium: 8.7 mg/dL — ABNORMAL LOW (ref 8.9–10.3)
Chloride: 108 mmol/L (ref 101–111)
GFR calc Af Amer: >60 mL/min (ref >60)
GLUCOSE: 106 mg/dL — AB (ref 65–99)
Potassium: 4.9 mmol/L (ref 3.5–5.1)
Sodium: 139 mmol/L (ref 135–145)
TOTAL PROTEIN: 6.9 g/dL (ref 6.5–8.1)

## 2016-12-27 LAB — CBC WITH DIFFERENTIAL/PLATELET
BASOS ABS: 0 10*3/uL (ref 0.0–0.1)
Basophils Relative: 0 %
Eosinophils Absolute: 0.1 10*3/uL (ref 0.0–0.7)
Eosinophils Relative: 2 %
HEMATOCRIT: 27.2 % — AB (ref 36.0–46.0)
HEMOGLOBIN: 8.8 g/dL — AB (ref 12.0–15.0)
LYMPHS ABS: 1.2 10*3/uL (ref 0.7–4.0)
LYMPHS PCT: 20 %
MCH: 29.5 pg (ref 26.0–34.0)
MCHC: 32.4 g/dL (ref 30.0–36.0)
MCV: 91.3 fL (ref 78.0–100.0)
MONOS PCT: 9 %
Monocytes Absolute: 0.6 10*3/uL (ref 0.1–1.0)
NEUTROS ABS: 4.2 10*3/uL (ref 1.7–7.7)
NEUTROS PCT: 69 %
Platelets: 246 10*3/uL (ref 150–400)
RBC: 2.98 MIL/uL — AB (ref 3.87–5.11)
RDW: 14.7 % (ref 11.5–15.5)
WBC: 6.1 10*3/uL (ref 4.0–10.5)

## 2016-12-27 LAB — MAGNESIUM: MAGNESIUM: 1.9 mg/dL (ref 1.7–2.4)

## 2016-12-27 MED ORDER — AMOXICILLIN-POT CLAVULANATE 875-125 MG PO TABS
1.0000 | ORAL_TABLET | Freq: Two times a day (BID) | ORAL | Status: DC
  Administered 2016-12-27: 1 via ORAL
  Filled 2016-12-27: qty 1

## 2016-12-27 MED ORDER — AMOXICILLIN-POT CLAVULANATE 875-125 MG PO TABS: 1.0000 | ORAL_TABLET | Freq: Two times a day (BID) | ORAL | 0 refills | Status: AC

## 2016-12-27 NOTE — Progress Notes (Signed)
LCSW following for inpatient psych placement.  Patient has [facility]. Patient is voluntary. Patient to transport by Juel BurrowPelham. RN notified of report number and accepting MD.  LCSW notified  patients SO, John or transfer, per patients request.   LCSW signing off. No further needs at this time.   Beulah GandyBernette Terry [place], LSCW [facility] CSW [phone]
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
## 2016-12-27 NOTE — Progress Notes (Signed)
PROGRESS NOTE    Brittany RaiderKimberley [name]  EXB:[id number] DOB: Jul 04, 1965 DOA: 12/25/2016 PCP: [name], [name], MD   Brief Narrative:  51 year old female with history of bipolar disorder with depression, hypertension, chronic pain presented with altered mental status and lethargy which did not improve with 4 mg of Narcan . She was started on IV Unasyn for probable aspiration pneumonia.     Assessment & Plan:   Principal Problem:   Bipolar I disorder, most recent episode mixed (HCC) Active Problems:   Hypothyroidism   Polypharmacy   Acute encephalopathy   History of hepatitis C virus infection   Overdose   Morbid obesity due to excess calories (HCC)   Essential hypertension   Severe episode of recurrent major depressive disorder, without psychotic features (HCC)   Positive hepatitis C antibody test   MDD (major depressive disorder), recurrent episode, severe (HCC)   Opioid abuse (HCC)   Toxic encephalopathy   Acute toxic encephalopathy secondary to overdose - Probably from medication overdose, patient states that she unintentionally took additional doses of baclofen (baclofen not listed as her home medication, states that it's her boyfriend's; reliability of history is poor) - Fall precautions -Sitter bedside -Medically cleared for discharge to psychiatric facility when facility is available  Probable aspiration pneumonia - Transition from Unasyn to Augmentin -Repeat chest x-ray from this morning shows improved right base opacity   History of bipolar disorder with depression - Patient states that she is been under a lot of stress recently and cannot sleep - Psychiatry evaluation encouraging patient to be placed under involuntary commitment if she tries to leave the hospital -Psychiatry consult agrees with inpatient psychiatric treatment when medically cleared for discharge -Medically cleared for discharge today  History of hepatitis C with mildly elevated LFTs -  Improved LFTs this morning  Hypokalemia -Within normal limits  Chronic pain - Outpatient follow-up with primary care provider and pain management  History of recent dog bite to the right forearm and wrist -Previously on Unasyn -Continue Augmentin started today     DVT prophylaxis: Lovenox Code Status: Full code Family Communication: No family bedside Disposition Plan: Patient medically cleared for discharge to inpatient psychiatric facility   Consultants:   Psychiatry  Procedures:   None  Antimicrobials:   Unasyn from 12/25/2016 to 12/26/2016  Augmentin 12-27-2016 onward   Subjective: Patient seen and examined on bedside rounds.  She voices concern about what aspiration pneumonia needs and her risk of developing this again.  She states she has problems swallowing due to her trigeminal neuralgia.  She denies any current pain or shortness of breath.  No overnight events noted  Objective: Vitals:   12/26/16 1040 12/26/16 1332 12/26/16 2117 12/27/16 0530  BP: 137/75 (!) 142/83 (!) 157/102 137/80  Pulse: 99 99 88 80  Resp: (!) 22 (!) 22 (!) 22 (!) 24  Temp: 99.1 F (37.3 C) 98.6 F (37 C) 98.2 F (36.8 C) 98.2 F (36.8 C)  TempSrc: Oral Oral Oral Oral  SpO2: 99% 99% 100% 97%  Weight:      Height:        Intake/Output Summary (Last 24 hours) at 12/27/2016 1410 Last data filed at 12/27/2016 1312 Gross per 24 hour  Intake 800 ml  Output -  Net 800 ml   Filed Weights   12/25/16 1612  Weight: 92.6 kg (204 lb 2.3 oz)    Examination:  General exam: Appears calm and comfortable  Respiratory system: No increased work of breathing, Rales in the right base  Cardiovascular system: S1 & S2 heard, RRR. No JVD, murmurs, rubs, gallops or clicks. No pedal edema. Gastrointestinal system: Abdomen is nondistended, soft and nontender. No organomegaly or masses felt. Normal bowel sounds heard. Central nervous system: Alert and oriented. No focal neurological  deficits. Extremities: Symmetric 5 x 5 power. Skin: Healing cuts noted on patient's arms and hands bilaterally Psychiatry: Questionable insight, appropriate mood    Data Reviewed: I have personally reviewed following labs and imaging studies  CBC: Recent Labs  Lab 12/25/16 0934 12/25/16 0943 12/26/16 0322 12/27/16 0633  WBC 9.5  --  10.3 6.1  NEUTROABS 7.5  --   --  4.2  HGB 9.4* 9.5* 9.1* 8.8*  HCT 28.5* 28.0* 27.9* 27.2*  MCV 89.6  --  90.9 91.3  PLT 251  --  248 246   Basic Metabolic Panel: Recent Labs  Lab 12/25/16 0934 12/25/16 0943 12/26/16 0322 12/27/16 0633  NA 139 143 142 139  K 3.8 4.0 3.2* 4.9  CL 107 108 109 108  CO2 22  --  24 24  GLUCOSE 90 91 139* 106*  BUN 28* 38* 15 8  CREATININE 1.34* 1.30* 0.92 0.80  CALCIUM 8.7*  --  8.9 8.7*  MG  --   --   --  1.9   GFR: Estimated Creatinine Clearance: 93.9 mL/min (by C-G formula based on SCr of 0.8 mg/dL). Liver Function Tests: Recent Labs  Lab 12/25/16 0934 12/26/16 0322 12/27/16 0633  AST 243* 155* 97*  ALT 102* 90* 74*  ALKPHOS 91 83 68  BILITOT 1.2 0.8 1.1  PROT 7.9 7.3 6.9  ALBUMIN 4.3 3.9 3.6   No results for input(s): LIPASE, AMYLASE in the last 168 hours. No results for input(s): AMMONIA in the last 168 hours. Coagulation Profile: No results for input(s): INR, PROTIME in the last 168 hours. Cardiac Enzymes: No results for input(s): CKTOTAL, CKMB, CKMBINDEX, TROPONINI in the last 168 hours. BNP (last 3 results) No results for input(s): PROBNP in the last 8760 hours. HbA1C: No results for input(s): HGBA1C in the last 72 hours. CBG: No results for input(s): GLUCAP in the last 168 hours. Lipid Profile: No results for input(s): CHOL, HDL, LDLCALC, TRIG, CHOLHDL, LDLDIRECT in the last 72 hours. Thyroid Function Tests: Recent Labs    12/25/16 1657  TSH 1.919   Anemia Panel: No results for input(s): VITAMINB12, FOLATE, FERRITIN, TIBC, IRON, RETICCTPCT in the last 72 hours. Sepsis  Labs: Recent Labs  Lab 12/25/16 0943  LATICACIDVEN 0.87    Recent Results (from the past 240 hour(s))  MRSA PCR Screening     Status: None   Collection Time: 12/25/16  4:33 PM  Result Value Ref Range Status   MRSA by PCR NEGATIVE NEGATIVE Final    Comment:        The GeneXpert MRSA Assay (FDA approved for NASAL specimens only), is one component of a comprehensive MRSA colonization surveillance program. It is not intended to diagnose MRSA infection nor to guide or monitor treatment for MRSA infections.          Radiology Studies: Dg Chest 2 View  Result Date: 12/27/2016 CLINICAL DATA:  Dyspnea.  Being treated for aspiration pneumonia. EXAM: CHEST  2 VIEW COMPARISON:  Two days ago FINDINGS: Improving aeration at the right base where there is now a subtle patchy density. No edema, effusion, or pneumothorax. Normal heart size and mediastinal contours. IMPRESSION: History of aspiration pneumonia.  Improving right base opacity. Electronically Signed   By: Marja KaysJonathon  Watts M.D.   On: 12/27/2016 09:50        Scheduled Meds: . amoxicillin-clavulanate  1 tablet Oral Q12H  . enoxaparin (LOVENOX) injection  40 mg Subcutaneous Q24H  . gabapentin  200 mg Oral BID   Continuous Infusions:   LOS: 2 days    Time spent: 30 minutes    Katrinka Blazing, MD [facility] Pager [phone]  If 7PM-7AM, please contact night-coverage [email or web address] Password [email or web address] 12/27/2016, 2:10 PM

## 2016-12-27 NOTE — Clinical Social Work Note (Signed)
Clinical Social Work Assessment  Patient Details  Name: Brittany [name] MRN: [id number] Date of Birth: Apr 09, 1965  Date of referral:  12/27/16               Reason for consult:  Discharge Planning, Suicide Risk/Attempt                Permission sought to share information with:  Case Manager, Customer service manager, Teacher, music, Family Supports Permission granted to share information::  Yes, Verbal Permission Granted  Name::     Risk manager::     Relationship::  SO  Contact Information:     Housing/Transportation Living arrangements for the past 2 months:  [place] of Information:  Patient Patient Interpreter Needed:  None Criminal Activity/Legal Involvement Pertinent to Current Situation/Hospitalization:  No - Comment as needed Significant Relationships:  Significant Other Lives with:  Significant Other Do you feel safe going back to the place where you live?  Yes Need for family participation in patient care:  Yes (Comment)  Care giving concerns:  No care giving concerns at the time of assessment   Social Worker assessment / plan:  LCSW consulted for inpatient psych placement.  Patient was admitted to the hospital for Bipolar I disorder, most recent episode mixed.  LCSW met with patient at bedside. No family bedside at time of assessment.   Patient reports that she currently lives with her fiance and he wants her home as soon as possible. She states they are not used to sleeping alone.   According to patient she continues to have nightmares and is not sleeping well.  Patient reports that she has been inpatient at [facility] in the past. According to medical records it seems as though she was there August of this year.   At time of assessment patient was willing to go inpatient voluntarily. Patient would like for her fiance, Jenny Reichmann to be notified.   PLAN: Patient will go inpatient psych once medically cleared.      Employment status:  Disabled  (Comment on whether or not currently receiving Disability) Insurance information:  Medicaid In [place] PT Recommendations:  No Follow Up Information / Referral to community resources:  Inpatient Psychiatric Care (Comment Required)(pt recommended for inpatient psych by MD)  Patient/Family's Response to care:  Patient is responsive to care, however ready to go home.   Patient/Family's Understanding of and Emotional Response to Diagnosis, Current Treatment, and Prognosis:    Patient has a clear understanding of her current diagnosis and is agreeable to the current treatment plan. Patient is willing to go inpatient voluntarily.   Emotional Assessment Appearance:  Appears stated age Attitude/Demeanor/Rapport:    Affect (typically observed):  Accepting, Depressed, Overwhelmed, Stable Orientation:  Oriented to Self, Oriented to Place, Oriented to  Time, Oriented to Situation Alcohol / Substance use:  Not Applicable Psych involvement (Current and /or in the community):     Discharge Needs  Concerns to be addressed:  No discharge needs identified Readmission within the last 30 days:  No Current discharge risk:  None Barriers to Discharge:  No Barriers Identified   Servando Snare, LCSW 12/27/2016, 9:47 AM

## 2016-12-27 NOTE — Progress Notes (Signed)
Report called to [facility]. SW called for transportation

## 2016-12-27 NOTE — Progress Notes (Addendum)
Physical Therapy Treatment Patient Details Name: Brittany [name] MRN: [id number] DOB: July 30, 1965 Today's Date: 12/27/2016    History of Present Illness 51 year-old female with history of bipolar disorder with depression, hypertension, chronic pain presented with altered mental status and lethargy , probable aspiration pneumonia. recent dog bites on hands and arms,memory loss.    PT Comments    Pt ambulated 250' with RW with no loss of balance. Progressing well with mobility. She has met PT goals and is ready to DC from PT standpoint. She is safe to ambulate in halls with RW with supervision. PT signing off.      Follow Up Recommendations  No PT follow up     Equipment Recommendations  Rolling walker with 5" wheels    Recommendations for Other Services       Precautions / Restrictions Precautions Precautions: Fall Precaution Comments: pt stated she's had some falls but vague about details Restrictions Weight Bearing Restrictions: No    Mobility  Bed Mobility Overal bed mobility: Independent                Transfers Overall transfer level: Needs assistance Equipment used: Rolling walker (2 wheeled) Transfers: Sit to/from Stand Sit to Stand: Supervision         General transfer comment: no assist  required for balance  Ambulation/Gait Ambulation/Gait assistance: Supervision Ambulation Distance (Feet): 350 Feet Assistive device: Rolling walker (2 wheeled) Gait Pattern/deviations: Step-through pattern   Gait velocity interpretation: at or above normal speed for age/gender General Gait Details: no loss of balance with ambulation, pt able to grip RW with BUEs despite pain and swelling in B hands from dog bites   Stairs            Wheelchair Mobility    Modified Rankin (Stroke Patients Only)       Balance Overall balance assessment: Needs assistance;History of Falls   Sitting balance-Leahy Scale: Normal       Standing balance-Leahy  Scale: Good                              Cognition Arousal/Alertness: Awake/alert Behavior During Therapy: WFL for tasks assessed/performed Overall Cognitive Status: No family/caregiver present to determine baseline cognitive functioning                                 General Comments: tangential topics while ambulating      Exercises      General Comments        Pertinent Vitals/Pain Pain Score: 10-Worst pain ever Pain Location: hands Pain Descriptors / Indicators: Discomfort Pain Intervention(s): Limited activity within patient's tolerance;Monitored during session    Home Living                      Prior Function            PT Goals (current goals can now be found in the care plan section) Acute Rehab PT Goals Patient Stated Goal: to get  home PT Goal Formulation: With patient Time For Goal Achievement: 01/02/17 Potential to Achieve Goals: Good Progress towards PT goals: Progressing toward goals    Frequency    Min 2X/week      PT Plan      Co-evaluation              AM-PAC PT "6 Clicks" Daily Activity  Outcome  Measure  Difficulty turning over in bed (including adjusting bedclothes, sheets and blankets)?: None Difficulty moving from lying on back to sitting on the side of the bed? : None Difficulty sitting down on and standing up from a chair with arms (e.g., wheelchair, bedside commode, etc,.)?: None Help needed moving to and from a bed to chair (including a wheelchair)?: A Little Help needed walking in hospital room?: A Little Help needed climbing 3-5 steps with a railing? : A Little 6 Click Score: 21    End of Session Equipment Utilized During Treatment: Gait belt Activity Tolerance: Patient tolerated treatment well Patient left: in bed;with call bell/phone within reach;with nursing/sitter in room Nurse Communication: Mobility status PT Visit Diagnosis: History of falling (Z91.81);Pain Pain -  Right/Left: (both) Pain - part of body: Hand     Time: 8469-6295 PT Time Calculation (min) (ACUTE ONLY): 12 min  Charges:  $Gait Training: 8-22 mins                    G Codes:          Philomena Doheny 12/27/2016, 2:49 PM [phone]

## 2016-12-27 NOTE — Progress Notes (Signed)
Pts IV removed with a clean and dry dressing intact. Pt denies pain at this time. Pt picked up by transport for rehab

## 2017-01-12 IMAGING — DX DG ABD PORTABLE 1V
1 series · 1 of 1 positions shown · non-contrast
Comparison: Portable exam 0339 hours compared to 02/28/2015

CLINICAL DATA: Orogastric tube placement

EXAM:
PORTABLE ABDOMEN - 1 VIEW

[abdomen kub]
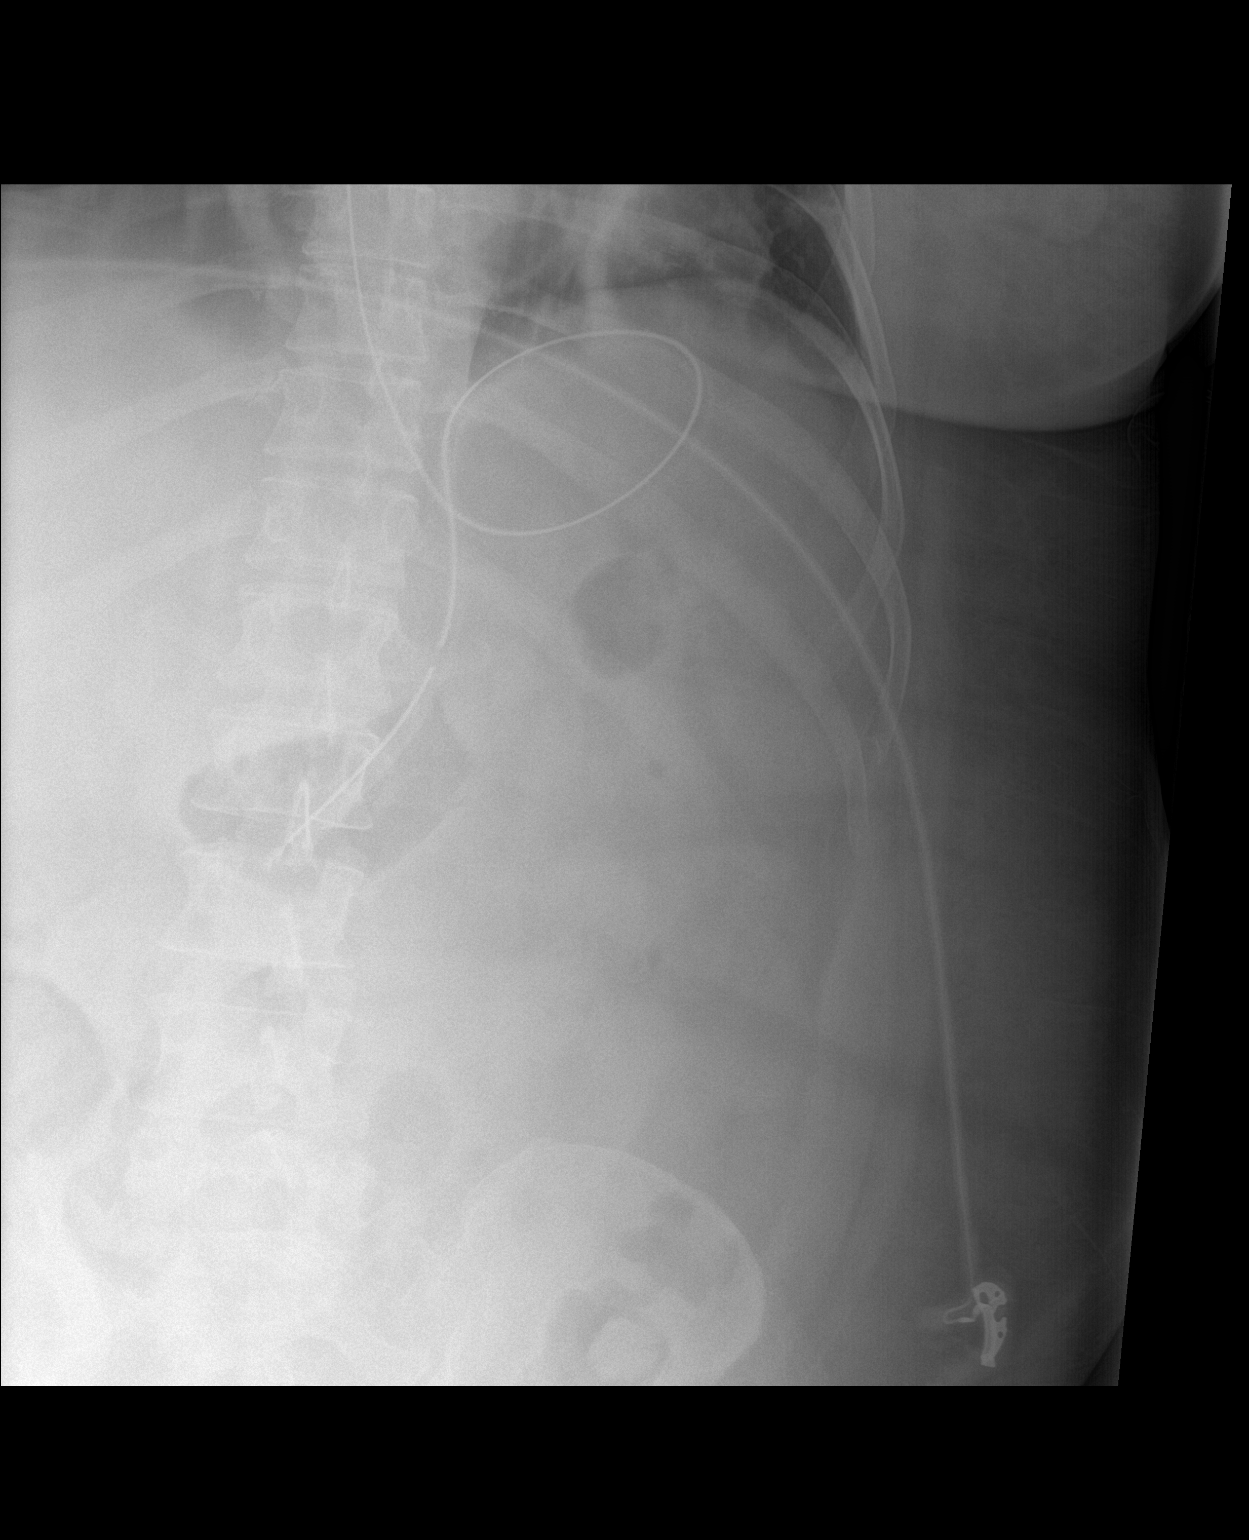

[1 of 1 positions shown; findings below may reference images not displayed]

FINDINGS: Orogastric tube coiled in proximal stomach with tip projecting over
gastric antrum.

Visualized bowel gas pattern normal.

Streaky atelectasis LEFT lower lobe.

Bones demineralized.
IMPRESSION: Nasogastric tube coiled in proximal stomach with tip projecting over
gastric antrum.

## 2017-01-12 IMAGING — DX DG CHEST 1V PORT
1 series · 1 of 1 positions shown · non-contrast
Comparison: 04/20/2015

CLINICAL DATA: Overdose.  ETT position

EXAM:
PORTABLE CHEST 1 VIEW

[chest ap]
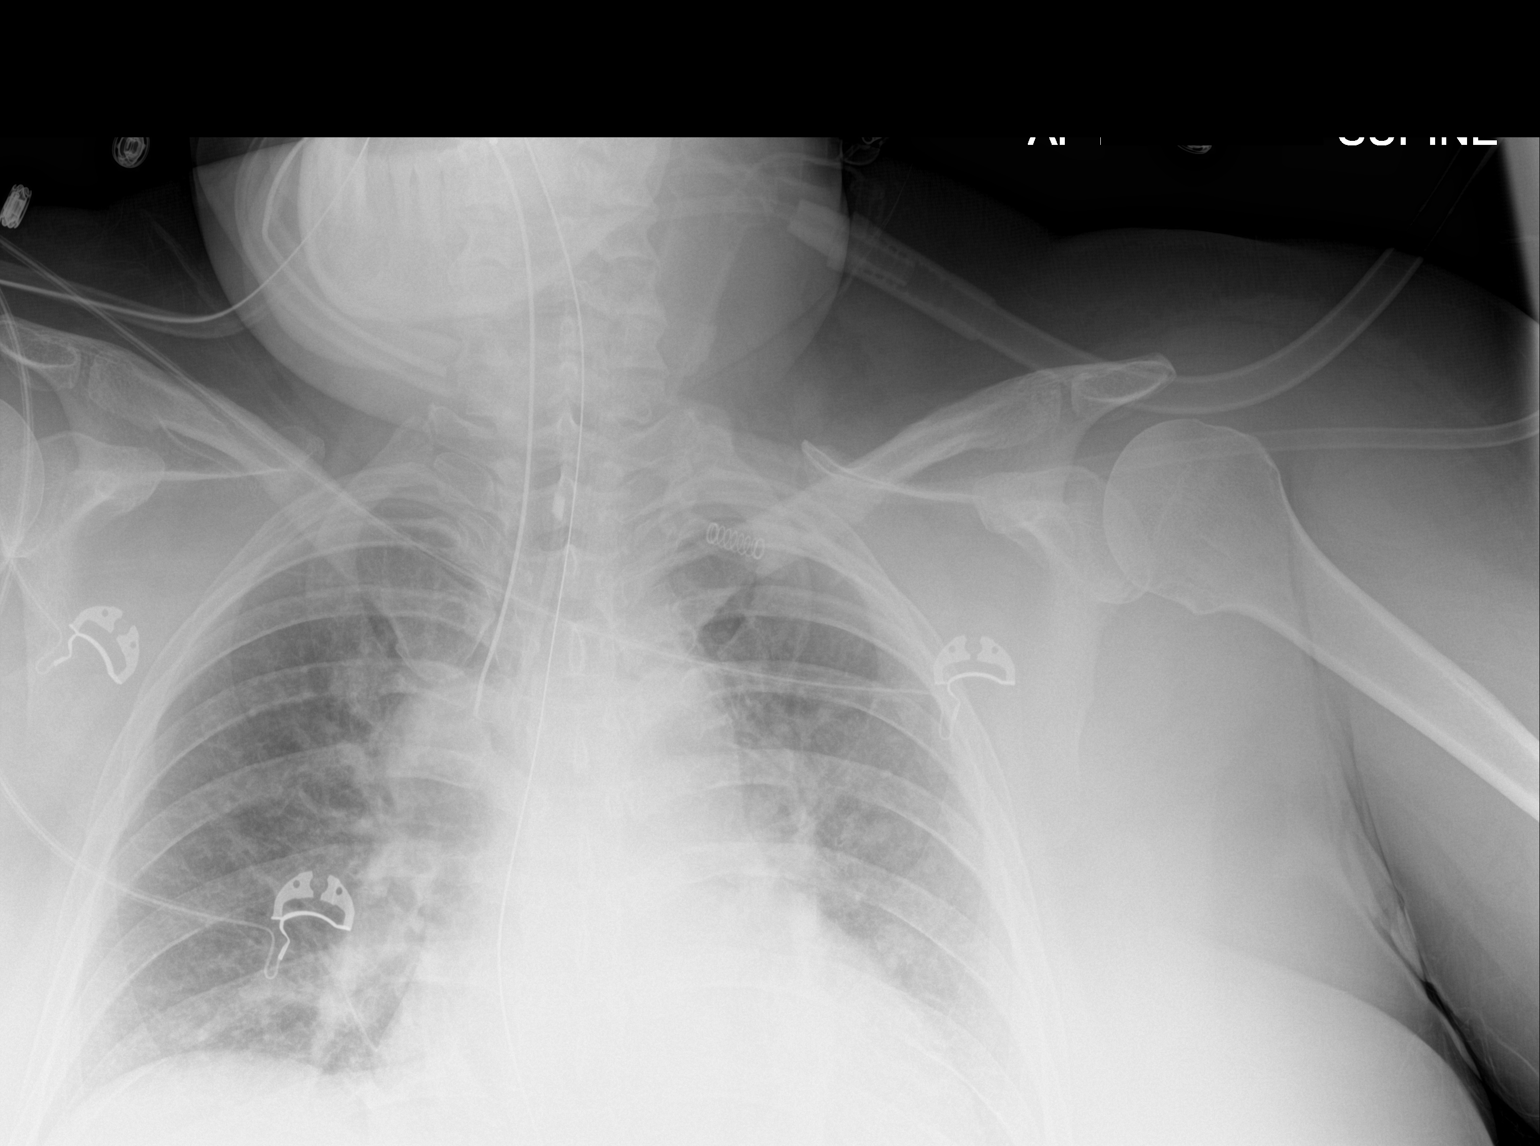

[1 of 1 positions shown; findings below may reference images not displayed]

FINDINGS: Endotracheal tube remains in stable position. NG tube is in placed
into the stomach. Worsening bilateral perihilar and lower lobe
airspace opacities which could reflect edema. Heart is borderline in
size. Low lung volumes. Possible small layering effusions.
IMPRESSION: Worsening aeration with bilateral perihilar and lower lobe
opacities, likely edema. Low lung volumes, question small effusions.

## 2017-10-24 ENCOUNTER — Other Ambulatory Visit: Payer: Self-pay

## 2017-10-24 ENCOUNTER — Encounter (HOSPITAL_COMMUNITY): Payer: Self-pay

## 2017-10-24 ENCOUNTER — Emergency Department (HOSPITAL_COMMUNITY)
Admission: EM | Admit: 2017-10-24 | Discharge: 2017-10-24 | Disposition: A | Discharge status: Home / Self Care | Payer: Medicaid Other | Attending: Emergency Medicine | Admitting: Emergency Medicine

## 2017-10-24 DIAGNOSIS — E039 Hypothyroidism, unspecified: Secondary | ICD-10-CM | POA: Insufficient documentation

## 2017-10-24 DIAGNOSIS — F17200 Nicotine dependence, unspecified, uncomplicated: Secondary | ICD-10-CM | POA: Insufficient documentation

## 2017-10-24 DIAGNOSIS — Z9114 Patient's other noncompliance with medication regimen: Secondary | ICD-10-CM | POA: Diagnosis not present

## 2017-10-24 DIAGNOSIS — Z79899 Other long term (current) drug therapy: Secondary | ICD-10-CM | POA: Insufficient documentation

## 2017-10-24 DIAGNOSIS — F319 Bipolar disorder, unspecified: Secondary | ICD-10-CM | POA: Diagnosis not present

## 2017-10-24 DIAGNOSIS — I10 Essential (primary) hypertension: Secondary | ICD-10-CM | POA: Diagnosis not present

## 2017-10-24 DIAGNOSIS — G3184 Mild cognitive impairment, so stated: Secondary | ICD-10-CM | POA: Insufficient documentation

## 2017-10-24 DIAGNOSIS — F419 Anxiety disorder, unspecified: Secondary | ICD-10-CM | POA: Insufficient documentation

## 2017-10-24 DIAGNOSIS — F1110 Opioid abuse, uncomplicated: Secondary | ICD-10-CM | POA: Insufficient documentation

## 2017-10-24 DIAGNOSIS — Z760 Encounter for issue of repeat prescription: Secondary | ICD-10-CM | POA: Diagnosis present

## 2017-10-24 DIAGNOSIS — F1410 Cocaine abuse, uncomplicated: Secondary | ICD-10-CM | POA: Diagnosis not present

## 2017-10-24 LAB — RAPID URINE DRUG SCREEN, HOSP PERFORMED
Amphetamines: NOT DETECTED
Barbiturates: NOT DETECTED
Benzodiazepines: POSITIVE — AB
Cocaine: POSITIVE — AB
OPIATES: NOT DETECTED
Tetrahydrocannabinol: POSITIVE — AB

## 2017-10-24 LAB — CBC WITH DIFFERENTIAL/PLATELET
Basophils Absolute: 0 10*3/uL (ref 0.0–0.1)
Basophils Relative: 0 %
Eosinophils Absolute: 0.1 10*3/uL (ref 0.0–0.7)
Eosinophils Relative: 1 %
HCT: 41.6 % (ref 36.0–46.0)
HEMOGLOBIN: 14.1 g/dL (ref 12.0–15.0)
LYMPHS ABS: 2 10*3/uL (ref 0.7–4.0)
Lymphocytes Relative: 23 %
MCH: 31 pg (ref 26.0–34.0)
MCHC: 33.9 g/dL (ref 30.0–36.0)
MCV: 91.4 fL (ref 78.0–100.0)
MONOS PCT: 8 %
Monocytes Absolute: 0.7 10*3/uL (ref 0.1–1.0)
NEUTROS PCT: 68 %
Neutro Abs: 6.1 10*3/uL (ref 1.7–7.7)
Platelets: 339 10*3/uL (ref 150–400)
RBC: 4.55 MIL/uL (ref 3.87–5.11)
RDW: 14.1 % (ref 11.5–15.5)
WBC: 8.9 10*3/uL (ref 4.0–10.5)

## 2017-10-24 LAB — BASIC METABOLIC PANEL
Anion gap: 11 (ref 5–15)
BUN: 13 mg/dL (ref 6–20)
CHLORIDE: 111 mmol/L (ref 98–111)
CO2: 22 mmol/L (ref 22–32)
Calcium: 10 mg/dL (ref 8.9–10.3)
Creatinine, Ser: 0.99 mg/dL (ref 0.44–1.00)
GFR calc non Af Amer: >60 mL/min (ref >60)
Glucose, Bld: 105 mg/dL — ABNORMAL HIGH (ref 70–99)
POTASSIUM: 3.7 mmol/L (ref 3.5–5.1)
Sodium: 144 mmol/L (ref 135–145)

## 2017-10-24 MED ORDER — SODIUM CHLORIDE 0.9 % IV SOLN
INTRAVENOUS | Status: DC
Start: 2017-10-24 — End: 2017-10-25
  Administered 2017-10-24: 21:00:00 via INTRAVENOUS

## 2017-10-24 MED ORDER — LORAZEPAM 2 MG/ML IJ SOLN
2.0000 mg | Freq: Once | INTRAMUSCULAR | Status: AC
Start: 2017-10-24 — End: 2017-10-24
  Administered 2017-10-24: 2 mg via INTRAVENOUS
  Filled 2017-10-24: qty 1

## 2017-10-24 MED ORDER — SODIUM CHLORIDE 0.9 % IV BOLUS
500.0000 mL | Freq: Once | INTRAVENOUS | Status: AC
  Administered 2017-10-24: 500 mL via INTRAVENOUS

## 2017-10-24 NOTE — ED Provider Notes (Signed)
[facility]-EMERGENCY DEPT Provider Note   CSN: [id number] Arrival date & time: 10/24/17  1731     History   Chief Complaint Chief Complaint  Patient presents with  . Medication Refill    HPI Brittany [name] is a 52 y.o. female.  HPI   Patient states she has been out of Lunesta, Klonopin and tramadol for 3 days and feels like she is "withdrawing."  Presents by EMS for evaluation and treatment.  She feels like she used to manage her medicines therefore, ran out.  She denies headache, focal weakness or paresthesia.  She denies chest pain or shortness of breath.  There are no other known modifying factors.  Past Medical History:  Diagnosis Date  . Anxiety   . Bipolar 1 disorder (HCC)   . Bulging lumbar disc   . Chronic bronchitis (HCC)   . Chronic lower back pain   . Depression   . Facial nerve injury   . Facial pain 11/2013   walking dog when she tripped and "faceplanted" landing on her face.   Marland Kitchen GERD (gastroesophageal reflux disease)   . Goiter   . Head injury   . Heart murmur   . Hyperlipidemia   . Hypertension   . Hypothyroidism   . Migraine    "weekly" (03/23/2015)  . Ruptured intervertebral disc   . Seizures (HCC)    "I've had a few; had a grand mal when I took Neurotin; still take Neurotin" (03/23/2015)  . Short-term memory loss     Patient Active Problem List   Diagnosis Date Noted  . Toxic encephalopathy 12/25/2016  . Opioid abuse (HCC)   . Polysubstance abuse (HCC)   . MDD (major depressive disorder), recurrent episode, severe (HCC) 09/21/2016  . Bipolar I disorder, most recent episode depressed (HCC) 05/16/2016  . History of trigeminal neuralgia 05/16/2016  . Benzodiazepine dependence (HCC) 05/16/2016  . Alcohol use disorder, moderate, dependence (HCC) 05/16/2016  . Positive hepatitis C antibody test 12/24/2015  . Bipolar 1 disorder (HCC) 12/23/2015  . Trigeminal neuralgia 12/23/2015  . Overdose 12/22/2015  . Baclofen overdose  10/28/2015  . Severe episode of recurrent major depressive disorder, without psychotic features (HCC)   . Acute respiratory failure with hypoxemia (HCC)   . Bipolar I disorder, most recent episode mixed (HCC) 06/14/2015  . Essential hypertension   . Sinus bradycardia 06/10/2015  . Prolonged QT interval 05/03/2015  . Morbid obesity due to excess calories (HCC)   . GERD (gastroesophageal reflux disease)   . Migraine without aura and without status migrainosus, not intractable   . Overdose 03/23/2015  . History of hepatitis C virus infection 02/26/2015  . Acute encephalopathy 02/24/2015  . Facial nerve injury   . Right facial pain 01/05/2015  . Polypharmacy 01/18/2012    Class: Chronic  . Borderline personality disorder (HCC) 01/18/2012  . Hypothyroidism 09/16/2011    Past Surgical History:  Procedure Laterality Date  . ABDOMINAL HYSTERECTOMY    . CESAREAN SECTION  2004  . EYE SURGERY Right 11/2013   emergent lateral canthotomy due to proptosis  [name] 11/30/2013  . TONSILLECTOMY       OB History   None      Home Medications    Prior to Admission medications   Medication Sig Start Date End Date Taking? Authorizing Provider  Cholecalciferol (VITAMIN D3) 5000 units CAPS Take 5,000 mg by mouth daily. 09/20/13  Yes [provider]  clonazePAM (KLONOPIN) 0.5 MG tablet Take 0.5 mg by mouth  4 (four) times daily.   Yes [provider]  DULoxetine (CYMBALTA) 60 MG capsule Take 60 mg by mouth 2 (two) times daily. 11/28/13  Yes [provider]  gabapentin (NEURONTIN) 400 MG capsule Take 400 mg by mouth 4 (four) times daily.   Yes [provider]  traMADol (ULTRAM) 50 MG tablet Take 50 mg by mouth 2 (two) times daily.   Yes [provider]  gabapentin (NEURONTIN) 100 MG capsule Take 2 capsules (200 mg total) by mouth 2 (two) times daily. Patient not taking: Reported on 10/24/2017 09/25/16   Thermon Leyland, NP    Family History Family History   Problem Relation Age of Onset  . Migraines Mother   . Bipolar disorder Mother   . Migraines Father   . Other Father   . Parkinson's disease Father   . Migraines Sister     Social History Social History   Tobacco Use  . Smoking status: Current Every Day Smoker  . Smokeless tobacco: Never Used  Substance Use Topics  . Alcohol use: No    Comment: 03/23/2015 "might drink at Christmastime"  . Drug use: Yes    Types: Marijuana    Comment: 03/23/2015 ""haven't smoked marijuana for years"     Allergies   Toradol [ketorolac tromethamine]; Suboxone [buprenorphine hcl-naloxone hcl]; Subutex [buprenorphine]; Tegretol [carbamazepine]; and Trazodone and nefazodone   Review of Systems Review of Systems  All other systems reviewed and are negative.    Physical Exam Updated Vital Signs BP (!) 140/99 (BP Location: Left Arm)   Pulse 81   Temp 98.3 F (36.8 C) (Oral)   Resp 18   Ht 5\' 2"  (1.575 m)   Wt 90.7 kg   LMP  (LMP Unknown)   SpO2 100%   BMI 36.58 kg/m   Physical Exam  Constitutional: She is oriented to person, place, and time. She appears well-developed and well-nourished. She appears distressed (Uncomfortable).  HENT:  Head: Normocephalic and atraumatic.  Eyes: Pupils are equal, round, and reactive to light. Conjunctivae and EOM are normal.  Neck: Normal range of motion and phonation normal. Neck supple.  Cardiovascular: Normal rate and regular rhythm.  Pulmonary/Chest: Effort normal and breath sounds normal. She exhibits no tenderness.  Musculoskeletal: Normal range of motion.  Neurological: She is alert and oriented to person, place, and time. She exhibits normal muscle tone.  Skin: Skin is warm and dry.  Diaphoretic  Psychiatric: Her behavior is normal.  Anxious  Nursing note and vitals reviewed.    ED Treatments / Results  Labs (all labs ordered are listed, but only abnormal results are displayed) Labs Reviewed  RAPID URINE DRUG SCREEN, HOSP PERFORMED -  Abnormal; Notable for the following components:      Result Value   Cocaine POSITIVE (*)    Benzodiazepines POSITIVE (*)    Tetrahydrocannabinol POSITIVE (*)    All other components within normal limits  BASIC METABOLIC PANEL - Abnormal; Notable for the following components:   Glucose, Bld 105 (*)    All other components within normal limits  CBC WITH DIFFERENTIAL/PLATELET    EKG None  Radiology No results found.  Procedures Procedures (including critical care time)  Medications Ordered in ED Medications  sodium chloride 0.9 % bolus 500 mL (0 mLs Intravenous Stopped 10/24/17 2333)  LORazepam (ATIVAN) injection 2 mg (2 mg Intravenous Given 10/24/17 2056)     Initial Impression / Assessment and Plan / ED Course  I have reviewed the triage vital signs  and the nursing notes.  Pertinent labs & imaging results that were available during my care of the patient were reviewed by me and considered in my medical decision making (see chart for details).      Patient Vitals for the past 24 hrs:  BP Temp Temp src Pulse Resp SpO2  10/24/17 2334 (!) 140/99 98.3 F (36.8 C) Oral 81 18 100 %    10:35 PM Reevaluation with update and discussion. After initial assessment and treatment, an updated evaluation reveals she is calm and comfortable, no diaphoresis or tremulousness.  Findings discussed and questions answered. Mancel Bale   Medical Decision Making: Anxiety with panic attack and medication noncompliance.  Incidental cocaine use which is likely contributing to her distress.  Doubt ongoing need for medication support or intervention at this time.  CRITICAL CARE- No Performed by: Mancel Bale   Nursing Notes Reviewed/ Care Coordinated Applicable Imaging Reviewed Interpretation of Laboratory Data incorporated into ED treatment  The patient appears reasonably screened and/or stabilized for discharge and I doubt any other medical condition or other [facility] requiring further screening,  evaluation, or treatment in the ED at this time prior to discharge.  Plan: Home Medications-continue usual prescribed medications; Home Treatments-stop using cocaine; return here if the recommended treatment, does not improve the symptoms; Recommended follow up-PCP follow-up as soon as possible.     Final Clinical Impressions(s) / ED Diagnoses   Final diagnoses:  Anxiety  Cocaine abuse (HCC)  Medication noncompliance due to cognitive impairment    ED Discharge Orders    None       Mancel Bale, MD 10/25/17 2313

## 2017-10-24 NOTE — ED Triage Notes (Addendum)
Pt BIBA from home. Pt has not had lunesta or viibryd in 2 days and is going through withdrawal.  Pt states she has been shaking. 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 Pt's pupils are dialated in triage. Pt denies SI/HI in triage

## 2017-10-24 NOTE — Discharge Instructions (Addendum)
Avoid using cocaine, and take all of your medication as prescribed.  Call your doctor in the morning for assistance and help as needed.
